# Patient Record
Sex: Male | Born: 1940 | Race: White | Hispanic: No | Marital: Married | Smoking: Former smoker
Health system: Southern US, Community
[De-identification: ages and names within clinical notes are randomized; demographics above are authoritative.]

## PROBLEM LIST (undated history)

## (undated) DIAGNOSIS — Z8719 Personal history of other diseases of the digestive system: Secondary | ICD-10-CM

## (undated) DIAGNOSIS — I724 Aneurysm of artery of lower extremity: Secondary | ICD-10-CM

## (undated) DIAGNOSIS — I219 Acute myocardial infarction, unspecified: Secondary | ICD-10-CM

## (undated) DIAGNOSIS — E785 Hyperlipidemia, unspecified: Secondary | ICD-10-CM

## (undated) DIAGNOSIS — E119 Type 2 diabetes mellitus without complications: Secondary | ICD-10-CM

## (undated) DIAGNOSIS — Z9289 Personal history of other medical treatment: Secondary | ICD-10-CM

## (undated) DIAGNOSIS — G510 Bell's palsy: Secondary | ICD-10-CM

## (undated) DIAGNOSIS — I10 Essential (primary) hypertension: Secondary | ICD-10-CM

## (undated) DIAGNOSIS — J449 Chronic obstructive pulmonary disease, unspecified: Secondary | ICD-10-CM

## (undated) DIAGNOSIS — I2510 Atherosclerotic heart disease of native coronary artery without angina pectoris: Secondary | ICD-10-CM

## (undated) DIAGNOSIS — M1990 Unspecified osteoarthritis, unspecified site: Secondary | ICD-10-CM

## (undated) DIAGNOSIS — I639 Cerebral infarction, unspecified: Secondary | ICD-10-CM

## (undated) DIAGNOSIS — C801 Malignant (primary) neoplasm, unspecified: Secondary | ICD-10-CM

## (undated) HISTORY — DX: Bell's palsy: G51.0

## (undated) HISTORY — DX: Chronic obstructive pulmonary disease, unspecified: J44.9

## (undated) HISTORY — PX: CARDIAC CATHETERIZATION: SHX172

## (undated) HISTORY — DX: Essential (primary) hypertension: I10

## (undated) HISTORY — DX: Type 2 diabetes mellitus without complications: E11.9

## (undated) HISTORY — PX: EYE SURGERY: SHX253

## (undated) HISTORY — DX: Unspecified osteoarthritis, unspecified site: M19.90

## (undated) HISTORY — DX: Personal history of other medical treatment: Z92.89

## (undated) HISTORY — PX: TONSILLECTOMY: SUR1361

## (undated) HISTORY — DX: Atherosclerotic heart disease of native coronary artery without angina pectoris: I25.10

## (undated) HISTORY — DX: Hyperlipidemia, unspecified: E78.5

---

## 1965-12-22 HISTORY — PX: THYROIDECTOMY, PARTIAL: SHX18

## 1996-12-22 DIAGNOSIS — Z9289 Personal history of other medical treatment: Secondary | ICD-10-CM

## 1996-12-22 HISTORY — DX: Personal history of other medical treatment: Z92.89

## 2001-08-22 ENCOUNTER — Encounter: Payer: Self-pay | Admitting: Family Medicine

## 2002-09-21 ENCOUNTER — Encounter: Payer: Self-pay | Admitting: Family Medicine

## 2003-02-20 ENCOUNTER — Encounter: Payer: Self-pay | Admitting: Family Medicine

## 2003-02-20 LAB — CONVERTED CEMR LAB: Hgb A1c MFr Bld: 5.8 %

## 2003-11-22 ENCOUNTER — Encounter: Payer: Self-pay | Admitting: Family Medicine

## 2004-01-25 LAB — FECAL OCCULT BLOOD, GUAIAC: Fecal Occult Blood: NEGATIVE

## 2004-08-22 ENCOUNTER — Encounter: Payer: Self-pay | Admitting: Family Medicine

## 2005-04-21 ENCOUNTER — Encounter: Payer: Self-pay | Admitting: Family Medicine

## 2005-04-21 LAB — CONVERTED CEMR LAB: PSA: 3.16 ng/mL

## 2005-04-24 ENCOUNTER — Ambulatory Visit: Payer: Self-pay | Admitting: Family Medicine

## 2005-05-26 ENCOUNTER — Ambulatory Visit: Payer: Self-pay | Admitting: Family Medicine

## 2005-08-22 HISTORY — PX: OTHER SURGICAL HISTORY: SHX169

## 2005-11-21 ENCOUNTER — Encounter: Payer: Self-pay | Admitting: Family Medicine

## 2005-11-21 LAB — CONVERTED CEMR LAB: PSA: 3.38 ng/mL

## 2005-11-24 ENCOUNTER — Ambulatory Visit: Payer: Self-pay | Admitting: Family Medicine

## 2005-12-01 ENCOUNTER — Ambulatory Visit: Payer: Self-pay | Admitting: Family Medicine

## 2006-06-22 ENCOUNTER — Ambulatory Visit: Payer: Self-pay | Admitting: Family Medicine

## 2006-06-22 LAB — CONVERTED CEMR LAB
Hgb A1c MFr Bld: 5.5 %
PSA: 2.13 ng/mL

## 2006-06-25 ENCOUNTER — Ambulatory Visit: Payer: Self-pay | Admitting: Family Medicine

## 2006-06-26 ENCOUNTER — Ambulatory Visit: Payer: Self-pay | Admitting: Cardiology

## 2006-06-29 ENCOUNTER — Ambulatory Visit: Payer: Self-pay

## 2006-06-29 HISTORY — PX: OTHER SURGICAL HISTORY: SHX169

## 2006-07-02 ENCOUNTER — Encounter (INDEPENDENT_AMBULATORY_CARE_PROVIDER_SITE_OTHER): Payer: Self-pay | Admitting: *Deleted

## 2006-07-02 ENCOUNTER — Ambulatory Visit (HOSPITAL_COMMUNITY): Admission: RE | Admit: 2006-07-02 | Discharge: 2006-07-03 | Payer: Self-pay | Admitting: Ophthalmology

## 2007-10-06 ENCOUNTER — Ambulatory Visit: Payer: Self-pay | Admitting: Family Medicine

## 2007-10-08 ENCOUNTER — Ambulatory Visit: Payer: Self-pay | Admitting: Family Medicine

## 2007-10-13 LAB — CONVERTED CEMR LAB
ALT: 24 units/L (ref 0–53)
AST: 21 units/L (ref 0–37)
Alkaline Phosphatase: 60 units/L (ref 39–117)
BUN: 22 mg/dL (ref 6–23)
Basophils Relative: 1 % (ref 0.0–1.0)
CO2: 28 meq/L (ref 19–32)
Calcium: 9 mg/dL (ref 8.4–10.5)
Chloride: 106 meq/L (ref 96–112)
Cholesterol: 198 mg/dL (ref 0–200)
Direct LDL: 94.2 mg/dL
Eosinophils Relative: 8.2 % — ABNORMAL HIGH (ref 0.0–5.0)
GFR calc Af Amer: 124 mL/min
GFR calc non Af Amer: 102 mL/min
HDL: 17.6 mg/dL — ABNORMAL LOW (ref >39.0)
Lymphocytes Relative: 31 % (ref 12.0–46.0)
Monocytes Relative: 10.9 % (ref 3.0–11.0)
Neutro Abs: 3.3 10*3/uL (ref 1.4–7.7)
Platelets: 183 10*3/uL (ref 150–400)
RBC: 5.69 M/uL (ref 4.22–5.81)
TSH: 2.08 microintl units/mL (ref 0.35–5.50)
Total CHOL/HDL Ratio: 11.3
Total Protein: 6.9 g/dL (ref 6.0–8.3)
Triglycerides: 772 mg/dL (ref 0–149)
VLDL: 154 mg/dL — ABNORMAL HIGH (ref 0–40)
WBC: 6.8 10*3/uL (ref 4.5–10.5)

## 2007-10-19 ENCOUNTER — Encounter: Payer: Self-pay | Admitting: Family Medicine

## 2007-10-19 DIAGNOSIS — L719 Rosacea, unspecified: Secondary | ICD-10-CM | POA: Insufficient documentation

## 2007-10-19 DIAGNOSIS — M1990 Unspecified osteoarthritis, unspecified site: Secondary | ICD-10-CM

## 2007-10-19 DIAGNOSIS — F528 Other sexual dysfunction not due to a substance or known physiological condition: Secondary | ICD-10-CM | POA: Insufficient documentation

## 2007-10-19 DIAGNOSIS — E7800 Pure hypercholesterolemia, unspecified: Secondary | ICD-10-CM | POA: Insufficient documentation

## 2007-10-19 DIAGNOSIS — G4700 Insomnia, unspecified: Secondary | ICD-10-CM | POA: Insufficient documentation

## 2007-10-19 DIAGNOSIS — I10 Essential (primary) hypertension: Secondary | ICD-10-CM | POA: Insufficient documentation

## 2007-10-28 ENCOUNTER — Ambulatory Visit: Payer: Self-pay | Admitting: Family Medicine

## 2007-10-28 LAB — CONVERTED CEMR LAB: Testosterone: 367.04 ng/dL (ref 350.00–890)

## 2007-11-01 ENCOUNTER — Ambulatory Visit: Payer: Self-pay | Admitting: Family Medicine

## 2007-11-24 ENCOUNTER — Telehealth: Payer: Self-pay | Admitting: Family Medicine

## 2007-12-13 ENCOUNTER — Encounter (INDEPENDENT_AMBULATORY_CARE_PROVIDER_SITE_OTHER): Payer: Self-pay | Admitting: *Deleted

## 2008-01-24 ENCOUNTER — Ambulatory Visit: Payer: Self-pay | Admitting: Family Medicine

## 2008-01-31 ENCOUNTER — Ambulatory Visit: Payer: Self-pay | Admitting: Family Medicine

## 2008-01-31 LAB — CONVERTED CEMR LAB
AST: 23 units/L (ref 0–37)
Albumin: 3.9 g/dL (ref 3.5–5.2)
BUN: 14 mg/dL (ref 6–23)
Basophils Absolute: 0.1 10*3/uL (ref 0.0–0.1)
Calcium: 9.1 mg/dL (ref 8.4–10.5)
Chloride: 104 meq/L (ref 96–112)
Cholesterol: 188 mg/dL (ref 0–200)
Creatinine,U: 122.7 mg/dL
Direct LDL: 92.9 mg/dL
Eosinophils Absolute: 0.5 10*3/uL (ref 0.0–0.6)
GFR calc non Af Amer: 89 mL/min
HDL: 24.1 mg/dL — ABNORMAL LOW (ref >39.0)
MCHC: 33.2 g/dL (ref 30.0–36.0)
MCV: 95.3 fL (ref 78.0–100.0)
Monocytes Relative: 7.8 % (ref 3.0–11.0)
Neutrophils Relative %: 51.1 % (ref 43.0–77.0)
PSA: 3.06 ng/mL (ref 0.10–4.00)
Platelets: 161 10*3/uL (ref 150–400)
RBC: 5.84 M/uL — ABNORMAL HIGH (ref 4.22–5.81)
Sodium: 140 meq/L (ref 135–145)
Triglycerides: 424 mg/dL (ref 0–149)

## 2008-05-16 ENCOUNTER — Telehealth: Payer: Self-pay | Admitting: Family Medicine

## 2008-10-30 ENCOUNTER — Telehealth: Payer: Self-pay | Admitting: Family Medicine

## 2009-07-02 ENCOUNTER — Telehealth: Payer: Self-pay | Admitting: Family Medicine

## 2009-08-04 ENCOUNTER — Ambulatory Visit: Payer: Self-pay | Admitting: Internal Medicine

## 2009-08-04 ENCOUNTER — Observation Stay (HOSPITAL_COMMUNITY): Admission: EM | Admit: 2009-08-04 | Discharge: 2009-08-05 | Payer: Self-pay | Admitting: Emergency Medicine

## 2009-08-04 DIAGNOSIS — Z9289 Personal history of other medical treatment: Secondary | ICD-10-CM

## 2009-08-04 HISTORY — DX: Personal history of other medical treatment: Z92.89

## 2009-08-05 ENCOUNTER — Telehealth: Payer: Self-pay | Admitting: Family Medicine

## 2009-08-06 ENCOUNTER — Ambulatory Visit: Payer: Self-pay | Admitting: Family Medicine

## 2009-08-06 DIAGNOSIS — M5430 Sciatica, unspecified side: Secondary | ICD-10-CM

## 2009-08-17 ENCOUNTER — Ambulatory Visit: Payer: Self-pay | Admitting: Family Medicine

## 2009-08-17 LAB — CONVERTED CEMR LAB
Glucose, Urine, Semiquant: NEGATIVE
Urobilinogen, UA: 0.2
WBC Urine, dipstick: NEGATIVE
pH: 6

## 2009-10-26 ENCOUNTER — Telehealth (INDEPENDENT_AMBULATORY_CARE_PROVIDER_SITE_OTHER): Payer: Self-pay | Admitting: Internal Medicine

## 2009-10-29 ENCOUNTER — Telehealth: Payer: Self-pay | Admitting: Family Medicine

## 2009-12-05 ENCOUNTER — Telehealth (INDEPENDENT_AMBULATORY_CARE_PROVIDER_SITE_OTHER): Payer: Self-pay | Admitting: Internal Medicine

## 2009-12-10 ENCOUNTER — Telehealth: Payer: Self-pay | Admitting: Family Medicine

## 2009-12-13 ENCOUNTER — Ambulatory Visit: Payer: Self-pay | Admitting: Family Medicine

## 2010-02-04 ENCOUNTER — Telehealth: Payer: Self-pay | Admitting: Family Medicine

## 2010-03-15 ENCOUNTER — Ambulatory Visit: Payer: Self-pay | Admitting: Family Medicine

## 2010-03-15 LAB — CONVERTED CEMR LAB
BUN: 15 mg/dL (ref 6–23)
CO2: 31 meq/L (ref 19–32)
Calcium: 9.5 mg/dL (ref 8.4–10.5)
Creatinine, Ser: 0.9 mg/dL (ref 0.4–1.5)
Glucose, Bld: 135 mg/dL — ABNORMAL HIGH (ref 70–99)

## 2010-03-18 ENCOUNTER — Ambulatory Visit: Payer: Self-pay | Admitting: Family Medicine

## 2010-03-18 DIAGNOSIS — E1169 Type 2 diabetes mellitus with other specified complication: Secondary | ICD-10-CM | POA: Insufficient documentation

## 2010-03-18 DIAGNOSIS — E119 Type 2 diabetes mellitus without complications: Secondary | ICD-10-CM | POA: Insufficient documentation

## 2010-06-04 ENCOUNTER — Ambulatory Visit: Payer: Self-pay | Admitting: Family Medicine

## 2010-06-04 DIAGNOSIS — F17200 Nicotine dependence, unspecified, uncomplicated: Secondary | ICD-10-CM | POA: Insufficient documentation

## 2010-06-04 DIAGNOSIS — M79609 Pain in unspecified limb: Secondary | ICD-10-CM

## 2010-07-25 ENCOUNTER — Encounter (INDEPENDENT_AMBULATORY_CARE_PROVIDER_SITE_OTHER): Payer: Self-pay | Admitting: *Deleted

## 2010-09-11 ENCOUNTER — Telehealth: Payer: Self-pay | Admitting: Family Medicine

## 2010-10-03 ENCOUNTER — Ambulatory Visit: Payer: Self-pay | Admitting: Family Medicine

## 2010-10-03 DIAGNOSIS — N138 Other obstructive and reflux uropathy: Secondary | ICD-10-CM

## 2010-10-03 DIAGNOSIS — N401 Enlarged prostate with lower urinary tract symptoms: Secondary | ICD-10-CM

## 2010-11-25 ENCOUNTER — Ambulatory Visit: Payer: Self-pay | Admitting: Family Medicine

## 2010-11-26 LAB — CONVERTED CEMR LAB
ALT: 62 units/L — ABNORMAL HIGH (ref 0–53)
AST: 60 units/L — ABNORMAL HIGH (ref 0–37)
Basophils Relative: 0.6 % (ref 0.0–3.0)
Bilirubin, Direct: 0.1 mg/dL (ref 0.0–0.3)
Chloride: 99 meq/L (ref 96–112)
Direct LDL: 83.1 mg/dL
Eosinophils Relative: 6.8 % — ABNORMAL HIGH (ref 0.0–5.0)
HCT: 48.9 % (ref 39.0–52.0)
Lymphs Abs: 2.5 10*3/uL (ref 0.7–4.0)
MCV: 93.2 fL (ref 78.0–100.0)
Microalb, Ur: 4.7 mg/dL — ABNORMAL HIGH (ref 0.0–1.9)
Monocytes Absolute: 0.6 10*3/uL (ref 0.1–1.0)
Neutrophils Relative %: 47.7 % (ref 43.0–77.0)
Potassium: 5 meq/L (ref 3.5–5.1)
RBC: 5.25 M/uL (ref 4.22–5.81)
Sodium: 136 meq/L (ref 135–145)
Total CHOL/HDL Ratio: 6
Total Protein: 7.2 g/dL (ref 6.0–8.3)
VLDL: 86.8 mg/dL — ABNORMAL HIGH (ref 0.0–40.0)
WBC: 6.9 10*3/uL (ref 4.5–10.5)

## 2010-11-28 ENCOUNTER — Ambulatory Visit: Payer: Self-pay | Admitting: Family Medicine

## 2010-11-28 DIAGNOSIS — F329 Major depressive disorder, single episode, unspecified: Secondary | ICD-10-CM

## 2010-11-28 LAB — HM DIABETES FOOT EXAM

## 2011-01-12 ENCOUNTER — Encounter: Payer: Self-pay | Admitting: Family Medicine

## 2011-01-21 NOTE — Progress Notes (Signed)
Summary: Rx Trazodone  Phone Note Refill Request Call back at [phone] Message from:  [facility] on September 11, 2010 10:34 AM  Refills Requested: Medication #1:  TRAZODONE HCL 100 MG  TABS 1 tablet by mouth at bedtime   Last Refilled: 05/14/2010  Method Requested: Electronic Initial call taken by: Sydell Axon LPN,  September 11, 2010 10:35 AM  Follow-up for Phone Call        done Follow-up by: Crawford Givens MD,  September 11, 2010 11:50 AM    Prescriptions: TRAZODONE HCL 100 MG  TABS (TRAZODONE HCL) 1 tablet by mouth at bedtime  #30 x 5   Entered and Authorized by:   Crawford Givens MD   Signed by:   Crawford Givens MD on 09/11/2010   Method used:   Electronically to        [facility] [id number] (retail)       [address] [place], [place]  [address]       Ph: [phone]       Fax: [phone]   RxID:   [id number]

## 2011-01-21 NOTE — Assessment & Plan Note (Signed)
Summary: F/U AND RIGHT SIDE PAIN/CLE   Vital Signs:  Patient profile:   70 year old male Weight:      201.25 pounds Temp:     98.5 degrees F oral Pulse rate:   72 / minute Pulse rhythm:   regular BP sitting:   122 / 84  (left arm) Cuff size:   large  Vitals Entered By: Sydell Axon LPN (June 04, 2010 10:53 AM) CC: Follow-up, right side pain off and on for 5-6 months   History of Present Illness: Pt here in followup for wt loss and BP. He is doing well and has continued to lose weight and feels better. His BP is correspondingly good. He has complaint of RL back pain which happens at various times of the day and the bottom line after much questioning is it sounds muscular. He has been taking his wife's Hydrocodone which he was not aware was narcotioc. We discussed narcotic use and he has decided to avoid at this point. In addition he had been on Naprsyn in the past and we discussed the efficacy for this problem but settled on trying Tyl ES 2 tabs three times a day as needed. He related this pain bothers hiom while golfing and esp after. Taht was covered as well and heat and ice were suggsested.  He also wants to quit smoking again and wants Zyban as he was successful with this in the past. He also has pain in the procx digit laterally of the left ring finger with small knot that appears to be tendonitis.  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 1 Tablet By Mouth At Bedtime 2)  Lopid 600 Mg  Tabs (Gemfibrozil) .Marland Kitchen.. 1 Twice A Day By Mouth 3)  Enalapril Maleate 10 Mg  Tabs (Enalapril Maleate) .Marland Kitchen.. 1 Daily By Mouth 4)  Cyclobenzaprine Hcl 10 Mg  Tabs (Cyclobenzaprine Hcl) .Marland Kitchen.. 1 Three Times A Day As Needed. 5)  Fexofenadine Hcl 180 Mg  Tabs (Fexofenadine Hcl) .Marland Kitchen.. 1 Once Daily As Needed 6)  Ecotrin Low Strength 81 Mg  Tbec (Aspirin) .... Take One By Mouth Once A Day 7)  B Complex 8)  Vitamin E .... Take One By Mouth Once A Day 9)  Ginseng 10)  Ginkoba 40 Mg  Tabs (Ginkgo Biloba) 11)  Cod Liver Oil   Caps (Cod Liver Oil) 12)  N.t.g. Gr. 1/150 .... As Needed 13)  Triamcinolone Acetonide 0.025 %  Crea (Triamcinolone Acetonide) .... Apply To Area Sparingly Two Times A Day As Needed. 14)  Onetouch Ultra Test  Strp (Glucose Blood) .... Check Blood Sugar As Directed 250.00 Daily 15)  Onetouch Ultrasoft Lancets  Misc (Lancets) .... Check Sugar Daily Icd9 Code: 250.00 16)  Prostate  Tabs (Specialty Vitamins Products) .... Two Tabs Twice Daily 17)  Vitamin C 500 Mg  Tabs (Ascorbic Acid) .... 1,000 Mg. One Daily 18)  Vitamin D-1000 Max St [phone] Mg-Unit Tabs (Calcium-Vitamin D) .Marland KitchenMarland KitchenMarland Kitchen  One Daily 19)  Naproxen 500 Mg Tabs (Naproxen) .... Take 1 Tablet By Mouth Two Times A Day As Needed With Food 20)  Fish Oil   Oil (Fish Oil) .... Once Daily 21)  Metformin Hcl 500 Mg Tabs (Metformin Hcl) .... One Tab By Mouth At Night  Allergies: 1)  ! Penicillin  Physical Exam  General:  alert, well-developed, well-nourished, and well-hydrated.   Head:  Normocephalic and atraumatic without obvious abnormalities. No apparent alopecia or balding. Eyes:  Conjunctiva clear bilaterally.  Ears:  External ear exam shows no significant lesions or deformities.  Otoscopic examination reveals clear canals, tympanic membranes are intact bilaterally without bulging, retraction, inflammation or discharge. Hearing is grossly normal bilaterally. Nose:   External nasal examination shows no deformity or inflammation. Nasal mucosa are pink and moist without lesions or exudates. Mouth:  Oral mucosa and oropharynx without lesions or exudates.  Teeth in good repair. Uvula with 5mm verrucal -appearing wart on right side of lat uvula Neck:  no JVD and no carotid bruits.   Lungs:  normal respiratory effort, no intercostal retractions, no accessory muscle use, normal breath sounds, no crackles, and no wheezes.   Heart:  normal rate, regular rhythm, and no murmur.   Msk:  RL back minimally tender with provacative maneuvers, always with motion and activity. Extremities:  Left ring finger tender on radial side of prox phalanx.   Impression & Recommendations:  Problem # 1:  HYPERTENSION, BENIGN (ICD-401.1) Assessment Unchanged Stable. His updated medication list for this problem includes:    Enalapril Maleate 10 Mg Tabs (Enalapril maleate) .Marland Kitchen... 1 daily by mouth  BP today: 122/84 Prior BP: 110/70 (03/18/2010)  Labs Reviewed: K+: 4.6 (03/15/2010) Creat: : 0.9 (03/15/2010)   Chol: 188 (01/31/2008)   HDL: 24.1 (01/31/2008)   LDL: DEL (01/31/2008)   TG: 424 (01/31/2008)  Problem # 2:  SCIATICA, CHRONIC (ICD-724.3) Assessment: Unchanged  Has now had some involvement of the right side. Discussed using heat and ice and exercises, warming up well with activity and pregolf routine. The following medications were removed from the medication list:    Naproxen 500 Mg Tabs (Naproxen) .Marland Kitchen... Take 1 tablet by mouth two times a day as needed with food His updated medication list for this problem includes:    Cyclobenzaprine Hcl 10 Mg Tabs (Cyclobenzaprine hcl) .Marland Kitchen... 1 three times a day as needed.    Ecotrin Low Strength 81 Mg Tbec (Aspirin) .Marland Kitchen... Take one by mouth once a day  Discussed use of moist heat or ice, modified activities, medications, and stretching/strengthening exercises. Back care instructions discussed.  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 Increase ASA to 2 81mg  a day. Sounds stable and not CVA related.  Problem # 4:  DEGENERATIVE JOINT DISEASE (ICD-715.90) Assessment: Unchanged  Prob also impacting the above. Heat and ice, exercises as discussed. The following medications were removed from the medication list:    Naproxen 500 Mg Tabs (Naproxen) .Marland Kitchen... Take 1 tablet by mouth two times a day as needed with food His updated medication list for this problem includes:    Ecotrin Low Strength 81 Mg Tbec (Aspirin) .Marland Kitchen... Take one by mouth once a day  Discussed use of medications, application of heat or cold, and exercises.   Problem # 5:  DM (ICD-250.00) Assessment: Improved  HAs to be improved. He has continued weight loss, now down to 201. Congrats. Cont efforts. He admits he feels better. His  updated medication list for this problem includes:    Enalapril Maleate 10 Mg Tabs (Enalapril maleate) .Marland Kitchen... 1 daily by mouth    Ecotrin Low Strength 81 Mg Tbec (Aspirin) .Marland Kitchen... Take one by mouth once a day    Metformin Hcl 500 Mg Tabs (Metformin hcl) ..... 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 He wants to try Zyban again as he was successful with that with no problems. Script written. His updated medication list for this problem includes:    Zyban 150 Mg Xr12h-tab (Bupropion hcl (smoking deter)) ..... One tab by mouth once daily for one week and then two times a day  Problem # 7:  PAIN IN SOFT TISSUES OF L RING FINGER (ICD-729.5) Assessment: New Appears to be pain at the insertion of the flexor tendon of the radial side of the proximal phalanx of the left ring finger with a reflex inflammatory nodule at the area. Suggest buddy  taping to relax and rest the flexor tendon by restricting flexion. Soak as much as poss in hot water.  Complete Medication List: 1)  Trazodone Hcl 100 Mg Tabs (Trazodone hcl) .Marland Kitchen.. 1 tablet by mouth at bedtime 2)  Lopid 600 Mg Tabs (Gemfibrozil) .Marland Kitchen.. 1 twice a day by mouth 3)  Enalapril Maleate 10 Mg Tabs (Enalapril maleate) .Marland Kitchen.. 1 daily by mouth 4)  Cyclobenzaprine Hcl 10 Mg Tabs (Cyclobenzaprine hcl) .Marland Kitchen.. 1 three times a day as needed. 5)  Ecotrin Low Strength 81 Mg Tbec (Aspirin) .... Take one by mouth once a day 6)  Ginkoba 40 Mg Tabs (Ginkgo biloba) 7)  Cod Liver Oil Caps (Cod liver oil) 8)  N.t.g. Gr. 1/150  .... As needed 9)  Triamcinolone Acetonide 0.025 % Crea (Triamcinolone acetonide) .... Apply to area sparingly two times a day as needed. 10)  Onetouch Ultra Test Strp (Glucose blood) .... Check blood sugar as directed 250.00 daily 11)  Onetouch Ultrasoft Lancets Misc (Lancets) .... Check sugar daily icd9 code: 250.00 12)  Prostate Tabs (Specialty vitamins products) .... Two tabs twice daily 13)  Vitamin C 500 Mg Tabs (Ascorbic acid) .... 1,000 mg. one daily 14)  Vitamin D-1000 Max St [phone] Mg-unit Tabs (Calcium-vitamin d) .... One daily 15)  Fish Oil Oil (Fish oil) .... Once daily 16)  Metformin Hcl 500 Mg Tabs (Metformin hcl) .... One tab by mouth at night 17)  Vitamin E 400 Unit Caps (Vitamin e) .... Take one by mouth daily 18)  B-100 Complex Tabs (Vitamins-lipotropics) .... Take one by mouth daily 19)  Ginseng 250 Mg Caps (Ginseng) .... Take one by mouth daily 20)  Zyban 150 Mg Xr12h-tab (Bupropion hcl (smoking deter)) .... One tab by mouth once daily for one week and then two times a day  Patient Instructions: 1)  40+ minutes spent with pt. Prescriptions: ZYBAN 150 MG XR12H-TAB (BUPROPION HCL (SMOKING DETER)) one tab by mouth once daily for one week and then two times a day  #60 x 12   Entered and Authorized by:   Shaune Leeks MD   Signed by:   Shaune Leeks MD on 06/04/2010   Method used:   Electronically to        [facility] [id number] (retail)  [address]       [place], [place]  [address]       Ph: [phone]       Fax: [phone]   RxID:   [id number]   Current Allergies (reviewed today): ! PENICILLIN

## 2011-01-21 NOTE — Assessment & Plan Note (Signed)
Summary: CPX/CLE   Vital Signs:  Patient profile:   70 year old male Weight:      201.50 pounds Temp:     98.3 degrees F oral Pulse rate:   76 / minute Pulse rhythm:   regular BP sitting:   124 / 84  (left arm) Cuff size:   large  Vitals Entered By: Sydell Axon LPN (November 28, 2010 10:59 AM) CC: 30 Minute checkup, needs a refill on Nitrostat   History of Present Illness: Pt here for Comp Exam. He is still on Zyban, off cigarettes for years, off cigars for 3 weeks. He thinks he has some depression...he gets down now and then...lasts half a day or so.  He denies SI/HI. He is not ready for other antidepress meds.  Preventive Screening-Counseling & Management  Alcohol-Tobacco     Alcohol drinks/day: 0     Smoking Status: quit     Year Quit: 10/05, still smokes cigars x2 daily     Cigars/week: 0     Passive Smoke Exposure: no  Caffeine-Diet-Exercise     Caffeine use/day: 3     Does Patient Exercise: yes     Type of exercise: walks      Times/week: 4  Problems Prior to Update: 1)  Musculoskeletal Pain, Right Pelvic Brim  (ICD-781.99) 2)  Benign Prostatic Hypertrophy, With Obstruction  (ICD-600.01) 3)  Pain in Soft Tissues of L Ring Finger  (ICD-729.5) 4)  Nicotine Addiction  (ICD-305.1) 5)  Dm (GLU INTOL 2003)  (ICD-250.00) 6)  Sciatica, Chronic  (ICD-724.3) 7)  Bell's Palsy, Right  (ICD-351.0) 8)  Erectile Dysfunction  (ICD-302.72) 9)  Rosacea  (ICD-695.3) 10)  Degenerative Joint Disease  (ICD-715.90) 11)  Insomnia  (ICD-780.52) 12)  Coronary Artery Disease, ( MI ANGIOPLASTY)  (ICD-414.00) 13)  Screening For Malignant Neoplasm, Prostate  (ICD-V76.44) 14)  Hypercholesterolemia  (ICD-272.0) 15)  Hypertension, Benign  (ICD-401.1) 16)  Fatigue  (ICD-780.79)  Medications Prior to Update: 1)  Trazodone Hcl 100 Mg  Tabs (Trazodone Hcl) .Marland Kitchen.. 1 Tablet By Mouth At Bedtime 2)  Lopid 600 Mg  Tabs (Gemfibrozil) .Marland Kitchen.. 1 Twice A Day By Mouth 3)  Enalapril Maleate 10 Mg  Tabs  (Enalapril Maleate) .Marland Kitchen.. 1 Daily By Mouth 4)  Cyclobenzaprine Hcl 10 Mg  Tabs (Cyclobenzaprine Hcl) .Marland Kitchen.. 1 Three Times A Day As Needed. 5)  Ecotrin Low Strength 81 Mg  Tbec (Aspirin) .... Take One By Mouth Once A Day 6)  Ginkoba 40 Mg  Tabs (Ginkgo Biloba) 7)  Cod Liver Oil   Caps (Cod Liver Oil) 8)  Triamcinolone Acetonide 0.025 %  Crea (Triamcinolone Acetonide) .... Apply To Area Sparingly Two Times A Day As Needed. 9)  Onetouch Ultra Test  Strp (Glucose Blood) .... Check Blood Sugar As Directed 250.00 Daily 10)  Onetouch Ultrasoft Lancets  Misc (Lancets) .... Check Sugar Daily Icd9 Code: 250.00 11)  Prostate  Tabs (Specialty Vitamins Products) .... Two Tabs Twice Daily 12)  Vitamin C 500 Mg  Tabs (Ascorbic Acid) .... 1,000 Mg. One Daily 13)  Fish Oil   Oil (Fish Oil) .... Once Daily 14)  Metformin Hcl 500 Mg Tabs (Metformin Hcl) .... One Tab By Mouth At Night 15)  Vitamin E 400 Unit Caps (Vitamin E) .... Take One By Mouth Daily 16)  B-100 Complex  Tabs (Vitamins-Lipotropics) .... Take One By Mouth Daily 17)  Ginseng 250 Mg Caps (Ginseng) .... Take One By Mouth Daily 18)  Zyban 150 Mg Xr12h-Tab (Bupropion Hcl (  Smoking Deter)) .... One Tab By Mouth Once Daily For One Week and Then Two Times A Day 19)  Fish Oil 1000 Mg Caps (Omega-3 Fatty Acids) .... Take One By Mouth Daily 20)  Nitrostat 0.4 Mg Subl (Nitroglycerin) .... Place One Under Tongue At Onset of Chest Pain, May Repeat in 5 Minutes  Current Medications (verified): 1)  Trazodone Hcl 100 Mg  Tabs (Trazodone Hcl) .Marland Kitchen.. 1 Tablet By Mouth At Bedtime 2)  Lopid 600 Mg  Tabs (Gemfibrozil) .Marland Kitchen.. 1 Twice A Day By Mouth 3)  Enalapril Maleate 10 Mg  Tabs (Enalapril Maleate) .Marland Kitchen.. 1 Daily By Mouth 4)  Cyclobenzaprine Hcl 10 Mg  Tabs (Cyclobenzaprine Hcl) .Marland Kitchen.. 1 Three Times A Day As Needed. 5)  Ecotrin Low Strength 81 Mg  Tbec (Aspirin) .... Take One By Mouth Once A Day 6)  Ginkoba 40 Mg  Tabs (Ginkgo Biloba) .... Take One By Mouth Daily 7)   Triamcinolone Acetonide 0.025 %  Crea (Triamcinolone Acetonide) .... Apply To Area Sparingly Two Times A Day As Needed. 8)  Onetouch Ultra Test  Strp (Glucose Blood) .... Check Blood Sugar As Directed 250.00 Daily 9)  Onetouch Ultrasoft Lancets  Misc (Lancets) .... Check Sugar Daily Icd9 Code: 250.00 10)  Prostate  Tabs (Specialty Vitamins Products) .... Two Tabs Twice Daily 11)  Vitamin C 500 Mg  Tabs (Ascorbic Acid) .... 1,000 Mg. One Daily 12)  Metformin Hcl 500 Mg Tabs (Metformin Hcl) .... One Tab By Mouth At Night 13)  Vitamin E 400 Unit Caps (Vitamin E) .... Take One By Mouth Daily 14)  B-100 Complex  Tabs (Vitamins-Lipotropics) .... Take One By Mouth Daily 15)  Ginseng 250 Mg Caps (Ginseng) .... Take One By Mouth Daily 16)  Zyban 150 Mg Xr12h-Tab (Bupropion Hcl (Smoking Deter)) .... One Tab By Mouth Once Daily For One Week and Then Two Times A Day 17)  Fish Oil 1000 Mg Caps (Omega-3 Fatty Acids) .... Take One By Mouth Daily 18)  Nitrostat 0.4 Mg Subl (Nitroglycerin) .... Place One Under Tongue At Onset of Chest Pain, May Repeat in 5 Minutes  Allergies: 1)  ! 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  Risk Factors: Alcohol Use: 0 (05-Dec-2010) Caffeine Use: 3 (Dec 05, 2010) Exercise: yes (Dec 05, 2010)  Risk Factors: Smoking Status: quit (December 05, 2010) Cigars/wk: 0 (12/05/2010) Passive  Smoke Exposure: no (12-05-10)  Past Surgical History: Partial Thyroidectomy B9 Growth 1967 HOSP MI PTCA 1993 ETT 98, wnl 1998 Cataract surgery repair lens which moved 09/06 Stress myoview sm distal anteroseptal & apical infarct 06/29/06 HOSP R Facial Weakness  Bell's Palsy 8/14-8/15/10 MRI/ MRA Brain  Atrophy  Sm vess Dz 08/04/09  Family History: Father: Died at age 9 of stroke and high blood pressure Mother: Died at age 69 of aneurysm and hypertension Brother A 38  CV + Father CAD, son MI HBP + Father, mother, son Breast/Ovarian/Uterine cancer - none Depression + self ETOH/Drug abuse - none Stroke -  none  Social History: Smoking Status:  quit  Review of Systems General:  Denies chills, fatigue, fever, sweats, weakness, and weight loss. Eyes:  Denies blurring, discharge, and eye pain. ENT:  Denies decreased hearing, ear discharge, earache, and ringing in ears. CV:  Denies chest pain or discomfort, fainting, fatigue, palpitations, shortness of breath with exertion, swelling of feet, and swelling of hands. Resp:  Denies cough, shortness of breath, and wheezing; improved off the cigars. GI:  Complains of constipation; denies abdominal pain, bloody stools, change in bowel habits, dark tarry stools, diarrhea, indigestion, loss of appetite, nausea, vomiting, vomiting blood, and yellowish skin color; occas.. GU:  Denies discharge, dysuria, nocturia, and urinary frequency; occas slowness. MS:  Complains of joint pain and low back pain; denies cramps and muscle weakness; R LBP. Derm:  Denies dryness and itching; has rosacea, plays golf once a week.. Neuro:  Denies numbness, poor balance, tingling, and tremors; has improved mild balance difficulties.  Physical Exam  General:  alert, well-developed, well-nourished, and well-hydrated.   Head:  Normocephalic and atraumatic without obvious abnormalities. No apparent alopecia or balding. Sinuses NT. Eyes:  Conjunctiva clear bilaterally.    Ears:  External ear exam shows no significant lesions or deformities.  Otoscopic examination reveals clear canals, tympanic membranes are intact bilaterally without bulging, retraction, inflammation or discharge. Hearing is grossly normal bilaterally. Nose:  External nasal examination shows no deformity or inflammation. Nasal mucosa are pink and moist without lesions or exudates. Mouth:  Oral mucosa and oropharynx without lesions or exudates.  Teeth in good repair. Uvula with 5mm verrucal -appearing wart on right side of lat uvula Neck:  no JVD and no carotid bruits.   Chest Wall:  No deformities, masses, tenderness or gynecomastia noted. Breasts:  No masses or gynecomastia noted Lungs:  normal respiratory effort, no intercostal retractions, no accessory muscle use, normal breath sounds, no crackles, and no wheezes.   Heart:  normal rate, regular rhythm, and no murmur.   Abdomen:  Bowel sounds positive,abdomen soft and non-tender without masses, organomegaly or hernias noted. Protuberant. Rectal:  No external abnormalities noted. Normal sphincter tone. No rectal masses or tenderness. G neg. Genitalia:  Testes bilaterally descended without nodularity, tenderness or masses. No scrotal masses or lesions. No penis lesions or urethral discharge. Prostate:  Prostate gland firm and smooth, no enlargement, nodularity, tenderness, mass, asymmetry or induration. 30 gms. Msk:  Minimal discomfort with movement but not tender to palpation over the right lateral pelvic brim. Pulses:  R and L carotid,radial,femoral,dorsalis pedis and posterior tibial pulses are full and equal bilaterally Extremities:  No clubbing, cyanosis, edema, or deformity noted with normal full range of motion of all joints.   Neurologic:  No cranial nerve deficits noted. Station and gait are normal.  Sensory, motor and coordinative functions appear intact. Skin:  Intact without suspicious lesions or rashes except rosacea of face and  neck. Cervical Nodes:  No lymphadenopathy noted Inguinal Nodes:  No significant adenopathy Psych:  Cognition and judgment appear intact. Alert and cooperative with normal attention span and concentration. No apparent delusions, illusions, hallucinations. Good affect today and good eye contact.  Diabetes Management Exam:    Foot Exam (with socks and/or shoes not present):       Sensory-Pinprick/Light touch:          Left medial foot (L-4): normal          Left dorsal foot (L-5): normal          Left lateral foot (  S-1): normal          Right medial foot (L-4): normal          Right dorsal foot (L-5): normal          Right lateral foot (S-1): normal       Sensory-Monofilament:          Left foot: normal          Right foot: normal       Inspection:          Left foot: normal          Right foot: normal       Nails:          Left foot: normal          Right foot: normal   Impression & Recommendations:  Problem # 1:  HEALTH MAINTENANCE EXAM (ICD-V70.0) Assessment Comment Only  Reviewed preventive care protocols, scheduled due services, and updated immunizations. I have personally reviewed the Medicare Annual Wellness questionnaire and have noted 1.   The patient's medical and social history 2.   Their use of alcohol, tobacco or illicit drugs 3.   Their current medications and supplements 4.   The patient's functional ability including ADL's, fall risks, home safety risks and hearing or visual             impairment. 5.   Diet and physical activities 6.   Evidence for depression or mood disorders (see note)  Problem # 2:  MUSCULOSKELETAL PAIN, RIGHT PELVIC BRIM (ICD-781.99) Assessment: Unchanged Continues but bearable and does not desire further trmt.  Problem # 3:  BENIGN PROSTATIC HYPERTROPHY, WITH OBSTRUCTION (ICD-600.01) Assessment: Unchanged Urine flow going well. PSA stable.  Problem # 4:  PAIN IN SOFT TISSUES OF L RING FINGER (ICD-729.5) Assessment:  Unchanged Continues but unchanged recently.  Problem # 5:  NICOTINE ADDICTION (ICD-305.1) Assessment: Improved Improving...Marland Kitchennow off cigars also and feels better great improvement in cough. May continue Zyban. His updated medication list for this problem includes:    Zyban 150 Mg Xr12h-tab (Bupropion hcl (smoking deter)) ..... One tab by mouth once daily for one week and then two times a day  Problem # 6:  DM (GLU INTOL 2003) (ICD-250.00) Assessment: Unchanged Good control. Encouraged increased exercise and diet control as he knows and admits he can do better. His updated medication list for this problem includes:    Enalapril Maleate 10 Mg Tabs (Enalapril maleate) .Marland Kitchen... 1 daily by mouth    Ecotrin Low Strength 81 Mg Tbec (Aspirin) .Marland Kitchen... Take one by mouth once a day    Metformin Hcl 500 Mg Tabs (Metformin hcl) ..... One tab by mouth at night  Labs Reviewed: Creat: 1.1 (11/25/2010)   Microalbumin: 6.9 (06/22/2006) Reviewed HgBA1c results: 6.4 (11/25/2010)  6.4 (03/15/2010)  Problem # 7:  HYPERCHOLESTEROLEMIA (ICD-272.0) Assessment: Unchanged Slight improvement in LDL but Trigs out of control. He admits to recent indiscretion and thinks he can improve this. His updated medication list for this problem includes:    Lopid 600 Mg Tabs (Gemfibrozil) .Marland Kitchen... 1 twice a day by mouth  Labs Reviewed: SGOT: 60 (11/25/2010)   SGPT: 62 (11/25/2010)   HDL:30.20 (11/25/2010), 24.1 (01/31/2008)  LDL:DEL (01/31/2008), DEL (10/08/2007)  Chol:179 (11/25/2010), 188 (01/31/2008)  Trig:434.0 Triglyceride is over 400; calculations on Lipids are invalid. mg/dL (16/09/9603), 540 (98/10/9146)  Problem # 8:  HYPERTENSION, BENIGN (ICD-401.1) Assessment: Unchanged Adequate control. Cont curr meds. His updated medication list for this problem includes:  Enalapril Maleate 10 Mg Tabs (Enalapril maleate) .Marland Kitchen... 1 daily by mouth  BP today: 124/84 Prior BP: 118/74 (10/03/2010)  Labs Reviewed: K+: 5.0  (11/25/2010) Creat: : 1.1 (11/25/2010)   Chol: 179 (11/25/2010)   HDL: 30.20 (11/25/2010)   LDL: DEL (01/31/2008)   TG: 434.0 Triglyceride is over 400; calculations on Lipids are invalid. mg/dL (95/62/1308)  Problem # 9:  DEPRESSION, MILD (ICD-311) Assessment: New Waxes and wanes on an irregular basis, typically in the AM which reportedly improves. He declines further medication at this point. His updated medication list for this problem includes:    Trazodone Hcl 100 Mg Tabs (Trazodone hcl) .Marland Kitchen... 1 tablet by mouth at bedtime  Complete Medication List: 1)  Trazodone Hcl 100 Mg Tabs (Trazodone hcl) .Marland Kitchen.. 1 tablet by mouth at bedtime 2)  Lopid 600 Mg Tabs (Gemfibrozil) .Marland Kitchen.. 1 twice a day by mouth 3)  Enalapril Maleate 10 Mg Tabs (Enalapril maleate) .Marland Kitchen.. 1 daily by mouth 4)  Cyclobenzaprine Hcl 10 Mg Tabs (Cyclobenzaprine hcl) .Marland Kitchen.. 1 three times a day as needed. 5)  Ecotrin Low Strength 81 Mg Tbec (Aspirin) .... Take one by mouth once a day 6)  Ginkoba 40 Mg Tabs (Ginkgo biloba) .... Take one by mouth daily 7)  Triamcinolone Acetonide 0.025 % Crea (Triamcinolone acetonide) .... Apply to area sparingly two times a day as needed. 8)  Onetouch Ultra Test Strp (Glucose blood) .... Check blood sugar as directed 250.00 daily 9)  Onetouch Ultrasoft Lancets Misc (Lancets) .... Check sugar daily icd9 code: 250.00 10)  Prostate Tabs (Specialty vitamins products) .... Two tabs twice daily 11)  Vitamin C 500 Mg Tabs (Ascorbic acid) .... 1,000 mg. one daily 12)  Metformin Hcl 500 Mg Tabs (Metformin hcl) .... One tab by mouth at night 13)  Vitamin E 400 Unit Caps (Vitamin e) .... Take one by mouth daily 14)  B-100 Complex Tabs (Vitamins-lipotropics) .... Take one by mouth daily 15)  Ginseng 250 Mg Caps (Ginseng) .... Take one by mouth daily 16)  Zyban 150 Mg Xr12h-tab (Bupropion hcl (smoking deter)) .... One tab by mouth once daily for one week and then two times a day 17)  Fish Oil 1000 Mg Caps (Omega-3  fatty acids) .... Take one by mouth daily 18)  Nitrostat 0.4 Mg Subl (Nitroglycerin) .... Place one under tongue at onset of chest pain, may repeat in 5 minutes  Other Orders: Gastroenterology Referral (GI)  Patient Instructions: 1)  RTC 6 mos, A1C prior 250.00 2)  Refer for colonoscopy. 3)  Get eye exam.   Orders Added: 1)  Gastroenterology Referral [GI] 2)  Est. Patient 65& > [[id number]]    Current Allergies (reviewed today): ! PENICILLIN

## 2011-01-21 NOTE — Progress Notes (Signed)
Summary: Rx Trazodone  Phone Note Refill Request Call back at [phone] Message from:  [facility] on February 04, 2010 8:47 AM  Refills Requested: Medication #1:  TRAZODONE HCL 100 MG  TABS 1 tablet by mouth at bedtime   Last Refilled: 12/10/2009 Received faxed refill request, please advise   Method Requested: Telephone to Pharmacy Initial call taken by: Linde Gillis CMA [place] Dull),  February 04, 2010 8:48 AM    Prescriptions: TRAZODONE HCL 100 MG  TABS (TRAZODONE HCL) 1 tablet by mouth at bedtime  #30 x 5   Entered and Authorized by:   Shaune Leeks MD   Signed by:   Shaune Leeks MD on 02/04/2010   Method used:   Electronically to        [facility] [id number] (retail)       [address] [place], [place]  [address]       Ph: [phone]       Fax: [phone]   RxID:   [id number]

## 2011-01-21 NOTE — Assessment & Plan Note (Signed)
Summary: DIZZY,NO ENERGY/CLE   Vital Signs:  Patient profile:   70 year old male Weight:      200.25 pounds BMI:     31.48 Temp:     97.9 degrees F oral Pulse rate:   84 / minute Pulse rhythm:   regular BP sitting:   118 / 74  (left arm) Cuff size:   large  Vitals Entered By: Sydell Axon LPN (October 03, 2010 11:40 AM) CC: Dizzy spells and no energy   History of Present Illness: Pt here "getting older."  He has notes w/ multiple complaints...he is taking OTC saw palmetto and OTC "Prostate Tab" It does not appear tobe excessive Saw Palmetto  We went over his medication list and told him most OTC to stop. He needs to be taking the prescribed meds on his list. The only on I gave him permission to stop is Trazodone and he is not interested in stopping that. He has indigestion at night, and we discussed avoiding nicotine, caffeine and tomato products. Also has routine pain over the right posterior pelvic brim. From the location and his complaints, it sounds muscular and we discussed approaches to help. 2 months ago started having severe fatigue and lack of desire to get off the couch. He had retired one month prior and had been staying busy until then. It sounds like he could be depressed. He recognized that things improved when he started on Zyban to help him stop smoking cigars. He is now just about off the smoking completely.  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 1 Tablet By Mouth At Bedtime 2)  Lopid 600 Mg  Tabs (Gemfibrozil) .Marland Kitchen.. 1 Twice A Day By Mouth 3)  Enalapril Maleate 10 Mg  Tabs (Enalapril Maleate) .Marland Kitchen.. 1 Daily By Mouth 4)  Cyclobenzaprine Hcl 10 Mg  Tabs (Cyclobenzaprine Hcl) .Marland Kitchen.. 1 Three Times A Day As Needed. 5)  Ecotrin Low Strength 81 Mg  Tbec (Aspirin) .... Take One By Mouth Once A Day 6)  Ginkoba 40 Mg  Tabs (Ginkgo Biloba) 7)  Cod Liver Oil   Caps (Cod Liver Oil) 8)  N.t.g. Gr. 1/150 .... As Needed 9)  Triamcinolone Acetonide 0.025 %  Crea (Triamcinolone Acetonide) .... Apply To Area Sparingly Two Times A Day As Needed. 10)  Onetouch Ultra Test  Strp (Glucose Blood) .... Check Blood Sugar As Directed 250.00 Daily 11)  Onetouch Ultrasoft Lancets  Misc (Lancets) .... Check Sugar Daily Icd9 Code: 250.00 12)  Prostate  Tabs (Specialty Vitamins Products) .... Two Tabs Twice Daily 13)  Vitamin C 500 Mg  Tabs (Ascorbic Acid) .... 1,000 Mg. One Daily 14)  Vitamin D-1000 Max St [phone] Mg-Unit Tabs (Calcium-Vitamin D) .... One Daily 15)  Fish Oil   Oil (Fish Oil) .... Once Daily 16)  Metformin Hcl 500 Mg Tabs (Metformin Hcl) .Marland KitchenMarland KitchenMarland Kitchen  One Tab By Mouth At Night 17)  Vitamin E 400 Unit Caps (Vitamin E) .... Take One By Mouth Daily 18)  B-100 Complex  Tabs (Vitamins-Lipotropics) .... Take One By Mouth Daily 19)  Ginseng 250 Mg Caps (Ginseng) .... Take One By Mouth Daily 20)  Zyban 150 Mg Xr12h-Tab (Bupropion Hcl (Smoking Deter)) .... One Tab By Mouth Once Daily For One Week and Then Two Times A Day  Allergies: 1)  ! Penicillin  Physical Exam  General:  alert, well-developed, well-nourished, and well-hydrated.   Head:  Normocephalic and atraumatic without obvious abnormalities. No apparent alopecia or balding. Eyes:  Conjunctiva clear bilaterally.  Ears:  External ear exam shows no significant lesions  or deformities.  Otoscopic examination reveals clear canals, tympanic membranes are intact bilaterally without bulging, retraction, inflammation or discharge. Hearing is grossly normal bilaterally. Nose:  External nasal examination shows no deformity or inflammation. Nasal mucosa are pink and moist without lesions or exudates. Mouth:  Oral mucosa and oropharynx without lesions or exudates.  Teeth in good repair. Uvula with 5mm verrucal -appearing wart on right side of lat uvula Neck:  no JVD and no carotid bruits.   Lungs:  normal respiratory effort, no intercostal retractions, no accessory muscle use, normal breath sounds, no crackles, and no wheezes.   Heart:  normal rate, regular rhythm, and no murmur.   Abdomen:  Bowel sounds positive,abdomen soft and non-tender without masses, organomegaly or hernias noted. Protuberant. Msk:  discomfort with movement but not tender to palpation over the right lateral pelvic brim. Extremities:  No clubbing, cyanosis, edema, or deformity noted with normal full range of motion of all joints.     Impression & Recommendations:  Problem # 1:  DM (ICD-250.00) Assessment Unchanged  Discussed again watching intake and diet. Pt appeared surprised to need to avoid sweets and carbs. He has been checking his sugar first thing in the AM fasting and has realized "what I eat for supper has a significant effect on that number!" Discussed diet, exercise and medication affects on DM. His updated medication list for this problem includes:    Enalapril Maleate 10 Mg Tabs (Enalapril maleate) .Marland Kitchen... 1 daily by mouth    Ecotrin Low Strength 81 Mg Tbec (Aspirin) .Marland Kitchen... Take one by mouth once a day    Metformin Hcl 500 Mg Tabs (Metformin hcl) ..... One tab by mouth at night Needs PE with foot exam. Needs eye exam. Labs Reviewed: Creat: 0.9 (03/15/2010)   Microalbumin: 6.9 (06/22/2006) Reviewed HgBA1c results: 6.4 (03/15/2010)  5.5 (06/22/2006)  Problem # 2:  NICOTINE ADDICTION  (ICD-305.1) Assessment: Improved Encouraged to stop smoking...appears he is on the way. His updated medication list for this problem includes:    Zyban 150 Mg Xr12h-tab (Bupropion hcl (smoking deter)) ..... One tab by mouth once daily for one week and then two times a day  Problem # 3:  FATIGUE (ICD-780.79) Assessment: Deteriorated Suggested regular exercise and routine diet to help. Going back to work and getting rid of depression will help. Does not appear to be physiologic. Cont Zyban 6 weeks after stopping smoking and then use caution stopping med with poss flare of depression. Come in if happens.  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  Problem # 5:  MUSCULOSKELETAL PAIN, RIGHT PELVIC BRIM (ICD-781.99) Assessment: New Discussed regulat exercise and stretching to help. No meds offered as seems lifestyle problem.  Problem # 6:  HYPERTENSION, BENIGN (ICD-401.1) Assessment: Unchanged Stable. His updated medication list for this problem includes:    Enalapril Maleate 10 Mg Tabs (Enalapril maleate) .Marland Kitchen... 1 daily by mouth  BP today: 118/74 Prior BP: 122/84 (06/04/2010)  Labs Reviewed: K+: 4.6 (03/15/2010) Creat: : 0.9 (03/15/2010)   Chol: 188 (01/31/2008)   HDL: 24.1 (01/31/2008)   LDL: DEL (01/31/2008)   TG: 424 (01/31/2008)  Complete Medication List: 1)  Trazodone Hcl 100 Mg Tabs (Trazodone hcl) .Marland Kitchen.. 1 tablet by mouth at bedtime 2)  Lopid 600 Mg Tabs (Gemfibrozil) .Marland Kitchen.. 1 twice a day by mouth 3)  Enalapril Maleate 10 Mg Tabs (Enalapril maleate) .Marland Kitchen.. 1 daily by mouth 4)  Cyclobenzaprine Hcl 10 Mg Tabs (Cyclobenzaprine hcl) .Marland Kitchen.. 1 three times a day as needed. 5)  Ecotrin Low Strength 81 Mg Tbec (Aspirin) .... Take one by mouth once a day 6)  Ginkoba 40 Mg Tabs (Ginkgo biloba) 7)  Cod Liver Oil Caps (Cod liver oil) 8)  Triamcinolone  Acetonide 0.025 % Crea (Triamcinolone acetonide) .... Apply to area sparingly two times a day as needed. 9)  Onetouch Ultra Test Strp (Glucose blood) .... Check blood sugar as directed 250.00 daily 10)  Onetouch Ultrasoft Lancets Misc (Lancets) .... Check sugar daily icd9 code: 250.00 11)  Prostate Tabs (Specialty vitamins products) .... Two tabs twice daily 12)  Vitamin C 500 Mg Tabs (Ascorbic acid) .... 1,000 mg. one daily 13)  Fish Oil Oil (Fish oil) .... Once daily 14)  Metformin Hcl 500 Mg Tabs (Metformin hcl) .... One tab by mouth at night 15)  Vitamin E 400 Unit Caps (Vitamin e) .... Take one by mouth daily 16)  B-100 Complex Tabs (Vitamins-lipotropics) .... Take one by mouth daily 17)  Ginseng 250 Mg Caps (Ginseng) .... Take one by mouth daily 18)  Zyban 150 Mg Xr12h-tab (Bupropion hcl (smoking deter)) .... One tab by mouth once daily for one week and then two times a day 19)  Fish Oil 1000 Mg Caps (Omega-3 fatty acids) .... Take one by mouth daily 20)  Nitrostat 0.4 Mg Subl (Nitroglycerin) .... Place one under tongue at onset of chest pain, may repeat in 5 minutes  Patient Instructions: 1)  RTC as needed. 2)  Needs eye exam.  Current Allergies (reviewed today): ! PENICILLIN

## 2011-01-21 NOTE — Assessment & Plan Note (Signed)
Summary: 8:00AM - F/U AFTER LABS / LFW   Vital Signs:  Patient profile:   70 year old male Weight:      208 pounds Temp:     76 degrees F oral Pulse rate:   76 / minute Pulse rhythm:   regular BP sitting:   110 / 70  (left arm) Cuff size:   large  Vitals Entered By: Sydell Axon LPN (March 18, 2010 8:40 AM) CC: 3 Month follow-up after labs   History of Present Illness: Pt here for recheck.  He has drastically cut down on sweets...he has lost three pounds in the three months. He thought he had gained. He walks a lot on the job.  He has also cut down on sausage. He eats a ham instead.  He is tolerating his Metformin w/o difficulty.  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 1 Tablet By Mouth At Bedtime 2)  Lopid 600 Mg  Tabs (Gemfibrozil) .Marland Kitchen.. 1 Twice A Day By Mouth 3)  Enalapril Maleate 10 Mg  Tabs (Enalapril Maleate) .Marland Kitchen.. 1 Daily By Mouth 4)  Cyclobenzaprine Hcl 10 Mg  Tabs (Cyclobenzaprine Hcl) .Marland Kitchen.. 1 Three Times A Day As Needed. 5)  Fexofenadine Hcl 180 Mg  Tabs (Fexofenadine Hcl) .Marland Kitchen.. 1 Once Daily Prn 6)  Ecotrin Low Strength 81 Mg  Tbec (Aspirin) .... Take One By Mouth Once A Day 7)  B Complex 8)  Vitamin E .... Take One By Mouth Once A Day 9)  Ginseng 10)  Ginkoba 40 Mg  Tabs (Ginkgo Biloba) 11)  Cod Liver Oil   Caps (Cod Liver Oil) 12)  N.t.g. Gr. 1/150 .... As Needed 13)  Triamcinolone Acetonide 0.025 %   Crea (Triamcinolone Acetonide) .... Apply To Area Sparingly Two Times A Day As Needed. 14)  Onetouch Ultra Test  Strp (Glucose Blood) .... Check Blood Sugar As Directed 250.00 Daily 15)  Onetouch Ultrasoft Lancets  Misc (Lancets) .... Check Sugar Daily Icd9 Code: 250.00 16)  Prostate  Tabs (Specialty Vitamins Products) .... Two Tabs Twice Daily 17)  Vitamin C 500 Mg  Tabs (Ascorbic Acid) .... 1,000 Mg. One Daily 18)  Vitamin D-1000 Max St [phone] Mg-Unit Tabs (Calcium-Vitamin D) .... One Daily 19)  Naproxen 500 Mg Tabs (Naproxen) .... Take 1 Tablet By Mouth Two Times A Day As Needed With Food 20)  Fish Oil   Oil (Fish Oil) .... Once Daily 21)  Metformin Hcl 500 Mg Tabs (Metformin Hcl) .... One Tab By Mouth At Night  Allergies: 1)  ! Penicillin  Physical Exam  General:  alert, well-developed, well-nourished, and well-hydrated.   Head:  Normocephalic and atraumatic without obvious abnormalities. No apparent alopecia or balding. Eyes:  Conjunctiva clear bilaterally.  Ears:  External ear exam shows no significant lesions or deformities.  Otoscopic examination reveals clear canals, tympanic membranes are intact bilaterally without bulging, retraction, inflammation or discharge. Hearing is grossly normal bilaterally. Nose:  External nasal  examination shows no deformity or inflammation. Nasal mucosa are pink and moist without lesions or exudates. Mouth:  Oral mucosa and oropharynx without lesions or exudates.  Teeth in good repair. Uvula with 5mm verrucal -appearing wart on right side of lat uvula Neck:  no JVD and no carotid bruits.   Lungs:  normal respiratory effort, no intercostal retractions, no accessory muscle use, normal breath sounds, no crackles, and no wheezes.   Heart:  normal rate, regular rhythm, and no murmur.   Abdomen:  Bowel sounds positive,abdomen soft and non-tender without masses, organomegaly or hernias noted. Protuberant. Msk:  Discommfort along right PSIS area, no  tenderness to palpation or swelling/echymosis.   Impression & Recommendations:  Problem # 1:  DM (ICD-250.00) Assessment New  Newly diagnosed, advanced from Glu Intol.Marland KitchenMarland KitchenMarland KitchenA1 C 6.4 but FBS 130s.  Cont Metformin. Discussed new risk and need for regular exercise and careful sweet and carb control.  He is hoping to retire soon.  His updated medication list for this problem includes:    Enalapril Maleate 10 Mg Tabs (Enalapril maleate) .Marland Kitchen... 1 daily by mouth    Ecotrin Low Strength 81 Mg Tbec (Aspirin) .Marland Kitchen... Take one by mouth once a day    Metformin Hcl 500 Mg Tabs (Metformin hcl) ..... One tab by mouth at night  Labs Reviewed: Creat: 0.9 (03/15/2010)   Microalbumin: 6.9 (06/22/2006) Reviewed HgBA1c results: 6.4 (03/15/2010)  5.5 (06/22/2006)  Problem # 2:  SCIATICA, CHRONIC (ICD-724.3) Assessment: Unchanged  Stable. Again discussed exercises. His updated medication list for this problem includes:    Cyclobenzaprine Hcl 10 Mg Tabs (Cyclobenzaprine hcl) .Marland Kitchen... 1 three times a day as needed.    Ecotrin Low Strength 81 Mg Tbec (Aspirin) .Marland Kitchen... Take one by mouth once a day    Naproxen 500 Mg Tabs (Naproxen) .Marland Kitchen... Take 1 tablet by mouth two times a day as needed with food  Discussed use of moist heat or ice, modified activities, medications, and stretching/strengthening exercises. Back care instructions given. To be seen in 2 weeks if no improvement; sooner if worsening of symptoms.   Problem # 3:  HYPERTENSION, BENIGN (ICD-401.1) Assessment: Unchanged  His updated medication list for this problem includes:    Enalapril Maleate 10 Mg Tabs (Enalapril maleate) .Marland Kitchen... 1 daily by mouth  BP today: 110/70 Prior BP: 124/80 (12/13/2009)  Labs Reviewed: K+: 4.6 (03/15/2010) Creat: : 0.9 (03/15/2010)   Chol: 188 (01/31/2008)   HDL: 24.1 (01/31/2008)   LDL: DEL (01/31/2008)   TG: 424 (01/31/2008)  Complete Medication List: 1)  Trazodone Hcl 100 Mg Tabs (Trazodone hcl) .Marland Kitchen.. 1 tablet by mouth at  bedtime 2)  Lopid 600 Mg Tabs (Gemfibrozil) .Marland Kitchen.. 1 twice a day by mouth 3)  Enalapril Maleate 10 Mg Tabs (Enalapril maleate) .Marland Kitchen.. 1 daily by mouth 4)  Cyclobenzaprine Hcl 10 Mg Tabs (Cyclobenzaprine hcl) .Marland Kitchen.. 1 three times a day as needed. 5)  Fexofenadine Hcl 180 Mg Tabs (Fexofenadine hcl) .Marland Kitchen.. 1 once daily as needed 6)  Ecotrin Low Strength 81 Mg Tbec (Aspirin) .... Take one by mouth once a day 7)  B Complex  8)  Vitamin E  .... Take one by mouth once a day 9)  Ginseng  10)  Ginkoba 40 Mg Tabs (Ginkgo biloba) 11)  Cod Liver Oil Caps (Cod liver oil) 12)  N.t.g. Gr. 1/150  .... As needed 13)  Triamcinolone Acetonide 0.025 % Crea (Triamcinolone acetonide) .... Apply to area sparingly two times a day as needed. 14)  Onetouch Ultra Test  Strp (Glucose blood) .... Check blood sugar as directed 250.00 daily 15)  Onetouch Ultrasoft Lancets Misc (Lancets) .... Check sugar daily icd9 code: 250.00 16)  Prostate Tabs (Specialty vitamins products) .... Two tabs twice daily 17)  Vitamin C 500 Mg Tabs (Ascorbic acid) .... 1,000 mg. one daily 18)  Vitamin D-1000 Max St [phone] Mg-unit Tabs (Calcium-vitamin d) .... One daily 19)  Naproxen 500 Mg Tabs (Naproxen) .... Take 1 tablet by mouth two times a day as needed with food 20)  Fish Oil Oil (Fish oil) .... Once daily 21)  Metformin Hcl 500 Mg Tabs (Metformin hcl) .... One tab by mouth at night  Patient Instructions: 1)  RTC 10/11 for Comp Exam, labs prior./ Call in one mo for appt.  Current Allergies (reviewed today): ! PENICILLIN

## 2011-01-21 NOTE — Letter (Signed)
Summary: Nadara Eaton letter  [facility]  [address] [place], [place] [address]   Phone: [phone]  Fax: [phone]       07/25/2010 MRN: [id number]  Nathan Fernandez [address] [place], [place]  [address]  Dear [name] [facility] [place] [place] announce the retirement of Arta Silence, M.D., from full-time practice at the [facility] office effective June 20, 2010 and his plans of returning part-time.  It is important to Dr. Hetty Ely and to our practice that you understand that [facility] has seven physicians in our office for your health care needs.  We will continue to offer the same exceptional care that you have today.    Dr. Hetty Ely has spoken to many of you about his plans for retirement and returning part-time in the fall.   We will continue to work with you through the transition to schedule appointments for you in the office and meet the high standards that [facility] is committed to.   Again, it is with great pleasure that we share the news that Dr. Hetty Ely will return to [facility] at [facility] in October of 2011 with a reduced schedule.    If you have any questions, or would like to request an appointment with one of our physicians, please call us at [phone] and press the option for Scheduling an appointment.  We take pleasure in providing you with excellent patient care and look forward to seeing you at your next office visit.  Our [facility] Physicians are:  Tillman Abide, M.D. Laurita Quint, M.D. Roxy Manns, M.D. Kerby Nora, M.D. Hannah Beat, M.D. Ruthe Mannan, M.D. We proudly welcomed Raechel Ache, M.D. and Eustaquio Boyden, M.D. to the practice in July/August 2011.  Sincerely,  [facility]

## 2011-02-17 ENCOUNTER — Other Ambulatory Visit: Payer: Self-pay | Admitting: Gastroenterology

## 2011-02-17 LAB — HM COLONOSCOPY: HM Colonoscopy: ABNORMAL

## 2011-02-24 ENCOUNTER — Encounter: Payer: Self-pay | Admitting: Family Medicine

## 2011-02-27 ENCOUNTER — Encounter: Payer: Self-pay | Admitting: Family Medicine

## 2011-03-04 NOTE — Miscellaneous (Signed)
  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    Preventive Care Screening  Colonoscopy:    Date:  02/17/2011    Next Due:  02/2013    Results:  Adenomatous Polyp

## 2011-03-11 NOTE — Procedures (Signed)
Summary: [facility]  [facility]   Imported By: Kassie Mends 03/03/2011 10:21:40  _____________________________________________________________________  External Attachment:    Type:   Image     Comment:   External Document

## 2011-03-30 LAB — CBC
HCT: 51.3 % (ref 39.0–52.0)
MCHC: 34.3 g/dL (ref 30.0–36.0)
MCV: 94.8 fL (ref 78.0–100.0)
Platelets: 169 10*3/uL (ref 150–400)

## 2011-03-30 LAB — URINALYSIS, ROUTINE W REFLEX MICROSCOPIC
Bilirubin Urine: NEGATIVE
Glucose, UA: NEGATIVE mg/dL
Hgb urine dipstick: NEGATIVE
Ketones, ur: NEGATIVE mg/dL
Protein, ur: NEGATIVE mg/dL
pH: 6 (ref 5.0–8.0)

## 2011-03-30 LAB — COMPREHENSIVE METABOLIC PANEL
ALT: 31 U/L (ref 0–53)
AST: 29 U/L (ref 0–37)
Albumin: 3.4 g/dL — ABNORMAL LOW (ref 3.5–5.2)
Alkaline Phosphatase: 42 U/L (ref 39–117)
GFR calc Af Amer: >60 mL/min (ref >60)
Glucose, Bld: 146 mg/dL — ABNORMAL HIGH (ref 70–99)
Potassium: 3.7 mEq/L (ref 3.5–5.1)
Sodium: 138 mEq/L (ref 135–145)
Total Protein: 6.1 g/dL (ref 6.0–8.3)

## 2011-03-30 LAB — CARDIAC PANEL(CRET KIN+CKTOT+MB+TROPI)
CK, MB: 3.9 ng/mL (ref 0.3–4.0)
CK, MB: 4.7 ng/mL — ABNORMAL HIGH (ref 0.3–4.0)
Relative Index: 1.5 (ref 0.0–2.5)
Total CK: 233 U/L — ABNORMAL HIGH (ref 7–232)
Total CK: 317 U/L — ABNORMAL HIGH (ref 7–232)
Troponin I: 0.01 ng/mL (ref 0.00–0.06)

## 2011-03-30 LAB — BASIC METABOLIC PANEL
BUN: 21 mg/dL (ref 6–23)
CO2: 26 mEq/L (ref 19–32)
Chloride: 105 mEq/L (ref 96–112)
Creatinine, Ser: 0.79 mg/dL (ref 0.4–1.5)
Glucose, Bld: 123 mg/dL — ABNORMAL HIGH (ref 70–99)
Potassium: 4.3 mEq/L (ref 3.5–5.1)

## 2011-03-30 LAB — LIPID PANEL
Triglycerides: 328 mg/dL — ABNORMAL HIGH (ref <150)
VLDL: 66 mg/dL — ABNORMAL HIGH (ref 0–40)

## 2011-03-30 LAB — DIFFERENTIAL
Basophils Relative: 1 % (ref 0–1)
Eosinophils Absolute: 0.4 10*3/uL (ref 0.0–0.7)
Eosinophils Relative: 4 % (ref 0–5)
Lymphs Abs: 2.8 10*3/uL (ref 0.7–4.0)
Neutrophils Relative %: 55 % (ref 43–77)

## 2011-03-30 LAB — POCT CARDIAC MARKERS
CKMB, poc: 1.9 ng/mL (ref 1.0–8.0)
Troponin i, poc: <0.05 ng/mL (ref 0.00–0.09)

## 2011-03-30 LAB — HEMOGLOBIN A1C: Hgb A1c MFr Bld: 6.2 % — ABNORMAL HIGH (ref 4.6–6.1)

## 2011-03-30 LAB — PROTIME-INR
INR: 0.9 (ref 0.00–1.49)
Prothrombin Time: 12.4 seconds (ref 11.6–15.2)

## 2011-04-14 ENCOUNTER — Other Ambulatory Visit: Payer: Self-pay | Admitting: *Deleted

## 2011-04-14 ENCOUNTER — Other Ambulatory Visit: Payer: Self-pay | Admitting: Family Medicine

## 2011-04-14 MED ORDER — GLUCOSE BLOOD VI STRP: ORAL_STRIP | Status: AC

## 2011-05-05 ENCOUNTER — Encounter: Payer: Self-pay | Admitting: Family Medicine

## 2011-05-06 NOTE — Discharge Summary (Signed)
NAME:  Nathan Fernandez, Nathan Fernandez NO.:  [id number]   MEDICAL RECORD NO.:  [id number]          PATIENT TYPE:  INP   LOCATION:  1428                         FACILITY:  [facility]   PHYSICIAN:  Willow Ora, MD           DATE OF BIRTH:  Sep 12, 1941   DATE OF ADMISSION:  08/04/2009  DATE OF DISCHARGE:  08/05/2009                               DISCHARGE SUMMARY   PRIMARY CARE PHYSICIAN:  Dr. Arta Silence at [facility] in  [place].   BRIEF HISTORY AND PHYSICAL:  Mr. [name] is 70 year old gentleman with  multiple cardiovascular risk factors who presented to the emergency room  yesterday with right facial weakness and was admitted to the hospital  for further evaluation.   PHYSICAL EXAM:  VITAL SIGNS:  Upon admission blood pressure was 148/95,  pulse 91, respiration 20, he was afebrile, pulse 94% on room air.  LUNGS:  Clear to auscultation bilaterally.  CARDIOVASCULAR:  Regular rate and rhythm without murmur.  ABDOMEN:  Benign.  NEUROLOGIC:  Exam he has some sensory loss in the right perioral area  and mandible area but no cranial deficits.  His gait is normal.  The  motor exam is normal as well.   LABORATORY AND X-RAYS:  MRI, MRA of the brain showed atrophy and small  vessel disease but no acute findings and the angiography of the  intracranial circulation was unremarkable.  Hemoglobin A1c was 6.2.  Cardiac enzymes showed a negative troponin three times, the CK was  slightly elevated.  Total cholesterol was 155, triglycerides 328, HDL  24, LDL 65, potassium 3.7, creatinine 0.7.  LFTs were normal.  CBC was  normal except for a hemoglobin of 17.6.   HOSPITAL COURSE:  The patient was admitted to the hospital with right  facial paresthesias and right facial droop.  Today on rounds his  forehead was also noted to be weaker, raising the suspicion of Bell's  palsy.  With that in mind neurology was consulted.  By the time of their  exam it was more evident that the patient's  problem was Bell's palsy.  He nevertheless got an MRI before the neurologic consult, and the  results are described above.  As far as the Bell's palsy he is ready to  be discharged.  We will not need to continue doing a stroke workup since  stroke and has not been the problem during this admission.  Additionally, his CK is a slightly elevated.  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  I have looked into the  inpatient and outpatient record and have not been able to find old EKGs  to compare.  Since his troponins were negative and he is asymptomatic I  think this needs to be followed up as an outpatient by his primary care  doctor.  At this point I think he got maximal hospital benefit.  Neurology definitely recommended steroids to help with the at Bell's  palsy.  I realize that the patient has diabetes and he does  not have  glucose monitor at present.  This is the reason why asked him to see his  primary care doctor next week.  He will be then discharged with the  following instructions.   DISCHARGE INSTRUCTIONS:  1. Continue with enalapril 10 mg daily.  2. Gemfibrozil 600 mg 2 times a day.  3. Naproxen 500 mg as needed.  4. Continue with trazodone 100 mg at night.  5. Continue with AndroGel as before.  6. Vicodin 5 mg one or two p.o. q.i.d. p.r.n. pain a prescription for      40 tablets was provided.  7. Prednisone 48 mg day one, the next day 30 mg, the next day 20 mg,      on the next day 10 mg.  A prescription was also issued.  8. The patient is instructed to call his primary care doctor next      week.  9. The patient is to alert the primary care doctor if there are any      symptoms related to high sugars such as being excessively thirsty      or increase in urine output.  10.He is advised to use a patch and ointment on the right eye for      protection.   ADMISSION DIAGNOSES:  Right facial weakness, possibly transient ischemic  attack.    DISCHARGE DIAGNOSES:  1. Bell's palsy.  2. History of high cholesterol.  3. History of coronary artery disease asymptomatic during this      admission, 3 troponin negative.  4. Tobacco abuse, counseled during this admission.  5. Hypertension.  6. Osteoarthritis.  7. Erectile dysfunction.      Willow Ora, MD  Electronically Signed     JP/MEDQ  D:  08/05/2009  T:  08/05/2009  Job:  [id number]   cc:   Arta Silence, MD  Fax: [phone]

## 2011-05-06 NOTE — Consult Note (Signed)
NAME:  Nathan Fernandez, Nathan Fernandez NO.:  [id number]   MEDICAL RECORD NO.:  [id number]          PATIENT TYPE:  INP   LOCATION:  1428                         FACILITY:  [facility]   PHYSICIAN:  Noel Christmas, MD    DATE OF BIRTH:  03/15/1941   DATE OF CONSULTATION:  08/05/2009  DATE OF DISCHARGE:                                 CONSULTATION   REASON FOR CONSULTATION:  Rule out Bell's palsy versus stroke.   HISTORY OF PRESENT ILLNESS:  This is a 70 year old man with a history of  onset of paresthesias and right-sided facial weakness on August 03, 2009.  He has also had pain below his right ear for about 1 week.  There  is no history of extremity weakness or sensory changes involving his  right hand.  He has had no change in hearing on the right side or any  change in taste on the right side of his tongue.  CT scan of his head as  well as MRI and MRA of his brain showed no acute intracranial  abnormality.  Small-vessel white matter ischemic changes were noted.  Weakness of his upper face is slightly more prominent today than  yesterday.  Otherwise, he is unchanged.   PAST MEDICAL HISTORY:  1. Coronary artery disease.  2. Hyperlipidemia.  3. Cataract surgery.  4. Tobacco abuse.  5. Hypertension.  6. Osteoarthritis.  7. Erectile dysfunction.  8. Partial thyroidectomy in 1967.  9. 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   MEDICATIONS:  1. Trazodone 100 mg nightly.  2. Lopid 600 mg per day.  3. Enalapril 1 per day.  4. Flexeril 10 mg t.i.d. p.r.n.  5. Fexofenadine 180 mg p.r.n. allergy symptoms.  6. AndroGel daily.  7. Aspirin 81 mg per day.  8. Multivitamin with B complex, ginseng, saw palmetto, ginkgo biloba      and cod liver oil daily.  9. Wellbutrin 150 mg twice a day.   FAMILY HISTORY:  Noncontributory.   PHYSICAL EXAMINATION:  GENERAL:  Appearance is that of an elderly man of  slender to medium build who was alert and  cooperative and in no acute  distress.  Mental status was normal.  HEENT/NEUROLOGIC:  Pupils, extraocular movements and visual fields were  normal.  He had slight numbness involving the right upper and lower  face.  He had mild frontalis weakness on the right.  There was moderate  weakness of his orbicularis oculi and nasalis muscles.  He had mild to  moderate weakness of the orbicularis oris and platysma muscles.  He had  no vesicles in his right ear canal.  Motor exam showed normal strength  and normal tone throughout.  Deep tendon reflexes were normal and  symmetrical.  Plantar responses were flexor.  Sensory examination was  normal except for reduced vibratory sensation distally in his left foot.  NECK:  Carotid auscultation was normal.   CLINICAL IMPRESSION:  1. Acute right Bell's palsy.  2. No clinical signs of acute stroke.   RECOMMENDATIONS:  1. Short course  of prednisone starting at 60 mg on day one, then taper      by 10 mg per day.  2. Ophthalmic ointment for right eye along with eye patch nightly      until he is able to maintain closure of his eye throughout the      night.  3. Follow up with primary physician in 2-4 weeks after discharge.  4. Neurology or ENT referral if Bell's palsy has not resolved in 4      weeks.   Thank you for asking me to evaluate Nathan Fernandez.      Noel Christmas, MD  Electronically Signed     CS/MEDQ  D:  08/05/2009  T:  08/05/2009  Job:  [id number]

## 2011-05-06 NOTE — H&P (Signed)
NAME:  Nathan Fernandez, Nathan Fernandez NO.:  [id number]   MEDICAL RECORD NO.:  [id number]          PATIENT TYPE:  INP   LOCATION:  1428                         Fernandez:  Nathan Fernandez   PHYSICIAN:  Nathan Fernandez, D.O.DATE OF BIRTH:  [birth year]   DATE OF ADMISSION:  08/04/2009  DATE OF DISCHARGE:                              HISTORY & PHYSICAL   PRIMARY CARE PHYSICIAN:  Dr. Laurita Quint MD.  This is an admission  for [facility].   HISTORY OF PRESENT ILLNESS:  [name] is a 70 year old man who  presented to the [facility] emergency department complaining of a right  facial numbness and facial droop.  Symptoms began intermittently several  days ago with worsening of symptoms the morning prior to this admission.  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  The symptoms were again intermittent.  There  were no other symptoms in the extremities.  He does have a history  significant for hyperlipidemia.  Past heart attack.  He is on daily  aspirin.  He has never had any TIA or stroke-like symptoms in the past.  He does not have any dental pain on the right side of his mouth or any  swelling.  He does note that he did have dental work several years ago  in that area and does have some nerve damage and a history of some  perioral numbness.   PAST MEDICAL HISTORY:  1. Coronary artery disease.  2. Hyperlipidemia.  3. History of cataract surgery.  4. Tobacco abuse.  5. Hypertension.  6. Osteoarthritis.  7. Erectile dysfunction.  8. History of partial thyroidectomy in 1967.  9. History of hospitalization for myocardial infarction in 1993 with      percutaneous angioplasty.  Last stress Myoview was in 2007 with     small distal intraseptal an apical infarcts.   MEDICATIONS:  1. Trazodone 100 mg tablets one p.o. q.h.s.  2. Approximate 500 mg one tablet p.o. b.i.d.  3. Lopid 600 mg one tablet p.o. daily.  4. Enalapril one tablet p.o. daily.  5. Flexeril one tablet p.o. p.r.n. for pain.  6. Fexofenadine 180 tablets p.o. p.r.n. for allergies.  7. AndroGel 50 mg, 5 mg once daily.  8. Aspirin 81 mg p.o. daily.  9. Multivitamin including B complex, vitamin E, Ginseng, saw palmetto,      ginkgo biloba, cod liver oil.  10.Wellbutrin 150 mg p.o. b.i.d.   ALLERGIES:  PENICILLIN.   FAMILY HISTORY:  His father died at age 85 of a stroke and high blood  pressure.  His mother died at the age of 60 of an aneurysm and  hypertension.  His brother is alive and well 44 with hypertension.  He  has a son who has a history of heart attack and  high blood pressure.  There is no history of cancer.  No history of drug or alcohol abuse.   SOCIAL HISTORY:  The patient is divorced, lives with partner.  There are  three living children:  Daughter who died at age of 18.  He is an  Art gallery manager for [employer].  He is an active daily smoker.  No  alcohol abuse.   REVIEW OF SYSTEMS:  Per HPI.   PHYSICAL EXAMINATION:  VITAL SIGNS:  Blood pressure 148/95, pulse 91,  respiratory rate 20, temperature 98.1, O2 sats 94% on room air.  GENERAL:  Well, appears stated age, well groomed 70 year old gentleman  in no acute distress.  He is alert, appropriate, cooperative throughout  the examination.  HEENT:  Normocephalic, atraumatic.  Eyes: Conjunctiva clear.  Visual  field fields are full.  Extraocular muscles are intact.  Nose:  External  examination is normal.  Mouth:  There is perioral numbness, mostly on  the right compared to left.  There is a mild right facial droop.  His  tongue is midline.  His uvula is midline.  NECK:  Supple.  There are no carotid bruits.  There are no masses or  lesions palpated.  No  thyromegaly, no adenopathy.  CHEST:  Wall with no deformities.  LUNGS:  Clear to auscultation.  HEART:  Regular rate and rhythm.  Normal S1-S2.  ABDOMEN:  Bowel sounds are positive.  Abdomen is soft, nontender.  EXTREMITIES:  There is no clubbing, cyanosis or edema.  He has normal  strength bilaterally in his upper and lower extremities.  NEUROLOGIC:  Other than right sensory loss to his right perioral area and mandibular  area, he no cranial nerve deficits.  His gait is normal.  He has normal  strength 5/5 bilaterally in his upper and lower extremities.  There is  no sensory loss in his upper and lower extremities.  He had a full range  of motion in all of his joints.  SKIN:  Intact without lesions or rashes.   ASSESSMENT/PLAN:  1. TIA, rule out stroke.  [name] is a 70 year old gentleman has      multiple risk factors for stroke including active tobacco use,      hyperlipidemia, hypertension and a history of MI as well as a      family history significant for stroke in his father when his father      was 71 years old.  His symptoms are mostly isolated to his right      facial region including a facial droop.  Symptoms are intermittent      suggesting TIA.  Will admit for complete stroke workup including      determination of source of stroke.  Will get a 2-D echo.  Will      watch him on telemetry to make sure he does not have any heart      arrhythmias contributing to his symptoms.  He will also get      bilateral carotid Dopplers and MRI of his brain.  He was on 81 mg      of aspirin prior to coming into the hospital.  Will increase this      to full strength aspirin and consider starting Plavix in this      patient.  We will have speech and language assess the sensory loss      of his face.  We will do a RN stroke swallow screening and resume a  normal diet.  Focus will be on aggressive secondary risk factor      reduction.  2. Hypertension.  We will continue his home  enalapril.  3. Hyperlipidemia.  Will continue his Lopid.  Currently noted that he      is not on a statin.  Will need to review the outpatient records to      determine if he has statin intolerability.  We will also check a      fasting lipid panel.  If there are no contraindications, the      patient would probably benefit from the addition of a statin to his      medication regimen.  4. Coronary artery disease, history of MI.  The patient has what      appears to be a new right bundle branch block on his EKG.  Will      monitor him on tele and cycle his cardiac enzymes.  He does not      have any active chest pain.      He may need a stress test as an outpatient.  His last stress test      was in 2007 which showed old irreversible small infarction on his      previous MI.  5. Tobacco abuse.  We will aggressively try to encourage smoking      cessation.      Nathan Fernandez, D.O.     Dorena Dew  D:  08/05/2009  T:  08/05/2009  Job:  [id number]

## 2011-05-07 ENCOUNTER — Ambulatory Visit (INDEPENDENT_AMBULATORY_CARE_PROVIDER_SITE_OTHER): Payer: Medicare Other | Admitting: Family Medicine

## 2011-05-07 ENCOUNTER — Encounter: Payer: Self-pay | Admitting: Family Medicine

## 2011-05-07 DIAGNOSIS — M5430 Sciatica, unspecified side: Secondary | ICD-10-CM

## 2011-05-07 DIAGNOSIS — M1990 Unspecified osteoarthritis, unspecified site: Secondary | ICD-10-CM

## 2011-05-07 DIAGNOSIS — I10 Essential (primary) hypertension: Secondary | ICD-10-CM

## 2011-05-07 DIAGNOSIS — F17200 Nicotine dependence, unspecified, uncomplicated: Secondary | ICD-10-CM

## 2011-05-07 DIAGNOSIS — R12 Heartburn: Secondary | ICD-10-CM | POA: Insufficient documentation

## 2011-05-07 NOTE — Assessment & Plan Note (Signed)
Discussed pathophysiology and hygiene to avoid.  GERD can be treated with medication but also with lifestyle changes including avoidance of late meals, excess alcohol, smoking cessation, avoiding spicy foods, chocolate, peppermint, colas, red wine and  acidic juices such as orange or tomato juice. Do not take mint or menthol products, use sugarless candy instead (Jolly Ranchers)

## 2011-05-07 NOTE — Assessment & Plan Note (Signed)
Well controlled. This does not appear involved in any of the above complaints.

## 2011-05-07 NOTE — Progress Notes (Signed)
  Subjective:    Patient ID: Nathan Fernandez, male    DOB: [birth year], 70 y.o.   MRN: [id number]  HPI 1.Pt here as acute visit for memory issues, lightheadedness, stumbling and retaining fluid. He had a hole in one in Feb at [place]. He is getting old with ailments. He has slight memory difficulties, nothing real serious...like putting something down, going out of the room and then forgetting where he put it.  2. He is still taking Zyban and still enjoys a cigar 2-3 times a week. We discussed smoking in relation to vascular health and its relationship to dementia, CAD, H/As, Fluid and indigestion. 3. He used to have headaches real bad and they resolved and now has a bad one occas. They are typically behind the left eye and last 10-20 mins, typically doesn't take anything but they resolve.  4. He has pain in his left shoulder Sun. And pain in the back. He occas takes Flexeril. These were reasonably isolated episodes. He plays golf routinely.  5. Lately he thinks he is retaining fluid in his hands and wrists. He has noticed esp in the AM he urinates first thing and then checks his sugar. After brfst he then urinates again. Then not much after that. Discussed increasing fluid intake. Also discussed avoiding salt..he admits to lots of salt use altho he has decreased lately.  6. Lastly, he loses his balance for a second or two occasionally. This may or may not be associated with changing position. 7. Also occas pretty constantly has indigestion.      Review of Systems  Constitutional: Negative for fever, chills, diaphoresis, activity change, appetite change and fatigue.  HENT: Negative for hearing loss, ear pain, congestion, sore throat, rhinorrhea, neck pain, neck stiffness, postnasal drip, sinus pressure, tinnitus and ear discharge.   Eyes: Negative for pain, discharge and visual disturbance.  Respiratory: Negative for cough, shortness of breath and wheezing.   Cardiovascular: Negative for chest pain  and palpitations.       No SOB w/ exertion  Gastrointestinal:       No heartburn or swallowing problems.  Genitourinary:       No nocturia  Skin:       No itching or dryness.  Neurological:       No tingling.  All other systems reviewed and are negative.       Objective:   Physical Exam  Constitutional: He appears well-developed and well-nourished. No distress.  HENT:  Head: Normocephalic and atraumatic.  Right Ear: External ear normal.  Left Ear: External ear normal.  Nose: Nose normal.  Mouth/Throat: Oropharynx is clear and moist.  Eyes: Conjunctivae and EOM are normal. Pupils are equal, round, and reactive to light. Right eye exhibits no discharge. Left eye exhibits no discharge.  Neck: Normal range of motion. Neck supple.  Cardiovascular: Normal rate and regular rhythm.   Pulmonary/Chest: Effort normal and breath sounds normal. He has no wheezes.  Lymphadenopathy:    He has no cervical adenopathy.  Skin: He is not diaphoretic.          Assessment & Plan:

## 2011-05-07 NOTE — Assessment & Plan Note (Addendum)
Same as above. No acute findings or red flags noted.

## 2011-05-07 NOTE — Patient Instructions (Signed)
GERD can be treated with medication but also with lifestyle changes including avoidance of late meals, excess alcohol, smoking cessation, avoiding spicy foods, chocolate, peppermint, colas, red wine and  acidic juices such as orange or tomato juice. Do not take mint or menthol products, use sugarless candy instead (Jolly Ranchers) Increase fluids.

## 2011-05-07 NOTE — Assessment & Plan Note (Signed)
Discussed avoiding nicotine/tar/carbon monoxide altogether.

## 2011-05-07 NOTE — Assessment & Plan Note (Addendum)
Evidence of past back disease. Discussed exercises to stabilize/ strengthen  the back.

## 2011-05-09 NOTE — Op Note (Signed)
NAME:  Nathan Fernandez, Nathan Fernandez NO.:  [id number]   MEDICAL RECORD NO.:  [id number]          PATIENT TYPE:  AMB   LOCATION:  SDS                          FACILITY:  [facility]   PHYSICIAN:  Requan D. Ashley Royalty, M.D. DATE OF BIRTH:  1941-04-06   DATE OF PROCEDURE:  07/02/2006  DATE OF DISCHARGE:                                 OPERATIVE REPORT   ADMISSION DIAGNOSIS:  Dislocated intraocular lens into the vitreous, right  eye.   PROCEDURES:  Pars plana vitrectomy with 25-gauge system, removal of  intraocular lens from the vitreous, placement of secondary intraocular lens  with suture, all on the right eye, and retinal photocoagulation, right eye.   SURGEON:  Dr. Alan Mulder.   ASSISTANT:  Rosalie Doctor, MA.   ANESTHESIA:  General.   DETAILS:  Usual prep and drape.  The indirect ophthalmoscope laser was moved  into place, 990 burns placed around the retinal periphery with a power of  440 milliwatts, 1000 microns each and 0.05 seconds each.  Once this was  accomplished, the attention was carried to the limbal area where peritomy  was performed from 8 o'clock around to 4 o'clock.  Previous Prolene suture  was removed.  The scleral flaps were raised, at 4 o'clock and 10 o'clock, in  anticipation of IOL suture.  A 25-gauge trocars placed at 8, 10 and 2  o'clock.  Infusion at 8 o'clock.  Provisc placed on the corneal surface.  A  three layer corneal wound was created from 10 o'clock to 2 o'clock.  The  pars plana vitrectomy was begun just behind the pupillary axis.  Pigment and  debris was removed from the vitreous cavity, as well as some of vitreous  strands.  The attention was carried to the 6 o'clock area where the  intraocular lens was ensnared in vitreous.  The the haptic was removed from  the vitreous with the vitrectomy cutter.  The lens was allowed to fall into  the back of the eye overlying the macular region.  The corneoscleral wound  was opened.  The lens was passed, with  25-gauge forceps, through the small  pupil into the anterior chamber and out the corneoscleral wound.  Two 10-0  Prolene sutures were then placed beneath scleral flaps, at 4 and 10 o'clock.  Sutures were externalized.  A new intraocular lens was brought onto the  field; model CZ70BD, [employer] power 23.0 D, length 12.5 mm,  optic 7.0 mm, serial number [id number], expiration date January 10, 2011.  The lens was brought onto the field, inspected and cleaned.  Sutures were  attached to the eyelets of the lens with triple knots.  The implant was then  placed through the corneoscleral wound into the anterior chamber through the  pupil, and into the posterior chamber.  It was dialed into place as the  sutures were pulled externally.  They were knotted externally beneath the  flaps, and the flaps were allowed to cover the knots.  The corneoscleral  wound was closed with 10-0 interrupted suture, four in number.  The wound  was  tested and found to be tight.  A 20% gas/fluid exchange was performed.  The trocars were removed and wounds were held until tight.  Conjunctiva was  closed with 7-0 chromic suture.  Polymyxin and gentamicin were irrigated  into tenons space.  Decadron 10 mg was injected to the lower subconjunctival  space.  Marcaine was injected around the globe for postop pain.  TobraDex  ophthalmic ointment, a patch and shield were placed.  The closing tension  was 10 with a Risk manager.  The patient was awakened and taken to  recovery in satisfactory condition.   OPERATIVE TIME:  1-1/4 hours.   COMPLICATIONS:  None.      Beulah Gandy. Ashley Royalty, M.D.  Electronically Signed     JDM/MEDQ  D:  07/02/2006  T:  07/02/2006  Job:  [id number]

## 2011-05-20 ENCOUNTER — Other Ambulatory Visit: Payer: Self-pay | Admitting: Family Medicine

## 2011-05-21 ENCOUNTER — Other Ambulatory Visit (INDEPENDENT_AMBULATORY_CARE_PROVIDER_SITE_OTHER): Payer: Medicare Other | Admitting: Family Medicine

## 2011-05-21 ENCOUNTER — Other Ambulatory Visit: Payer: Self-pay

## 2011-05-21 DIAGNOSIS — E119 Type 2 diabetes mellitus without complications: Secondary | ICD-10-CM

## 2011-05-28 ENCOUNTER — Ambulatory Visit: Payer: Self-pay | Admitting: Family Medicine

## 2011-06-09 ENCOUNTER — Other Ambulatory Visit: Payer: Self-pay | Admitting: *Deleted

## 2011-06-09 MED ORDER — BUPROPION HCL ER (SR) 150 MG PO TB12: ORAL_TABLET | ORAL | Status: DC

## 2011-07-02 ENCOUNTER — Encounter: Payer: Self-pay | Admitting: Family Medicine

## 2011-07-02 ENCOUNTER — Other Ambulatory Visit: Payer: Self-pay | Admitting: Family Medicine

## 2011-07-02 ENCOUNTER — Ambulatory Visit (INDEPENDENT_AMBULATORY_CARE_PROVIDER_SITE_OTHER): Payer: Medicare Other | Admitting: Family Medicine

## 2011-07-02 DIAGNOSIS — F17200 Nicotine dependence, unspecified, uncomplicated: Secondary | ICD-10-CM

## 2011-07-02 DIAGNOSIS — F329 Major depressive disorder, single episode, unspecified: Secondary | ICD-10-CM

## 2011-07-02 DIAGNOSIS — E119 Type 2 diabetes mellitus without complications: Secondary | ICD-10-CM

## 2011-07-02 DIAGNOSIS — I10 Essential (primary) hypertension: Secondary | ICD-10-CM

## 2011-07-02 DIAGNOSIS — F3289 Other specified depressive episodes: Secondary | ICD-10-CM

## 2011-07-02 MED ORDER — METFORMIN HCL 500 MG PO TABS: 500.0000 mg | ORAL_TABLET | Freq: Every day | ORAL | Status: DC

## 2011-07-02 MED ORDER — TRAZODONE HCL 100 MG PO TABS: 100.0000 mg | ORAL_TABLET | Freq: Every day | ORAL | Status: DC

## 2011-07-02 MED ORDER — BUPROPION HCL ER (SR) 150 MG PO TB12: 150.0000 mg | ORAL_TABLET | Freq: Two times a day (BID) | ORAL | Status: DC

## 2011-07-02 NOTE — Patient Instructions (Addendum)
I suggest Zostavax. Grtr than 50% of time spent in counselling, spent with pt.

## 2011-07-02 NOTE — Assessment & Plan Note (Signed)
Wants refill for Bupropion. Think he uses this more for smoking.

## 2011-07-02 NOTE — Assessment & Plan Note (Signed)
Stable and well controlled. BP Readings from Last 3 Encounters:  07/02/11 120/72  05/07/11 118/70  11/28/10 124/84

## 2011-07-02 NOTE — Assessment & Plan Note (Signed)
Am not sure if the pt is still in denial or if he merely is noncompliant. Again discussed risks and benefits of good control vs bad control and diet and exercise to effect both.  Cont on Metformin as ordered. Try to be more careful of what he eats.  A1C 6.4 from 6.1 last time.

## 2011-07-02 NOTE — Progress Notes (Signed)
  Subjective:    Patient ID: Nathan Fernandez, male    DOB: 1941-07-20, 70 y.o.   MRN: [id number]  HPI Pt here for 6 month followup. He has no complaints except back pain. He is going back to work. He had been retired but was called back and will be going up to [employer] for 6 months. He is here for followup. He has realized that pasta causes his sugar to elevate. We again discussed dietary direction and avoiding sweets and carbs;   Review of Systems  Constitutional: Negative for fever, chills, diaphoresis, activity change, appetite change and fatigue.  HENT: Negative for hearing loss, ear pain, congestion, sore throat, rhinorrhea, neck pain, neck stiffness, postnasal drip, sinus pressure, tinnitus and ear discharge.   Eyes: Negative for pain, discharge and visual disturbance.  Respiratory: Negative for cough, shortness of breath and wheezing.   Cardiovascular: Negative for chest pain and palpitations.       No SOB w/ exertion  Gastrointestinal:       No heartburn or swallowing problems.  Genitourinary:       No nocturia  Skin:       No itching or dryness.  Neurological:       No tingling or balance problems.  All other systems reviewed and are negative.       Objective:   Physical Exam  Constitutional: He appears well-developed and well-nourished. No distress.  HENT:  Head: Normocephalic and atraumatic.  Right Ear: External ear normal.  Left Ear: External ear normal.  Nose: Nose normal.  Mouth/Throat: Oropharynx is clear and moist.  Eyes: Conjunctivae and EOM are normal. Pupils are equal, round, and reactive to light. Right eye exhibits no discharge. Left eye exhibits no discharge.  Neck: Normal range of motion. Neck supple.  Cardiovascular: Normal rate and regular rhythm.   Pulmonary/Chest: Effort normal and breath sounds normal. He has no wheezes.  Lymphadenopathy:    He has no cervical adenopathy.  Skin: He is not diaphoretic.          Assessment & Plan:

## 2011-07-02 NOTE — Assessment & Plan Note (Signed)
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 Discussed avoiding them altogether.

## 2011-07-03 ENCOUNTER — Telehealth: Payer: Self-pay | Admitting: *Deleted

## 2011-07-03 NOTE — Telephone Encounter (Signed)
Signed.

## 2011-07-03 NOTE — Telephone Encounter (Signed)
[facility] has faxed a form requesting order for zostavax, form is on your desk.

## 2011-07-03 NOTE — Telephone Encounter (Signed)
Form completed, faxed back to [facility].

## 2011-12-29 ENCOUNTER — Telehealth: Payer: Self-pay | Admitting: *Deleted

## 2011-12-29 DIAGNOSIS — E119 Type 2 diabetes mellitus without complications: Secondary | ICD-10-CM

## 2011-12-29 NOTE — Telephone Encounter (Signed)
Yes.  Lab orders are in.

## 2011-12-29 NOTE — Telephone Encounter (Signed)
Patient has an appointment scheduled with you on 01/07/12 and wants to know if he needs to have any lab work prior to that appointment? Please advise.

## 2011-12-30 NOTE — Telephone Encounter (Signed)
Patient advised.  Lab appt scheduled.  

## 2012-01-02 ENCOUNTER — Other Ambulatory Visit (INDEPENDENT_AMBULATORY_CARE_PROVIDER_SITE_OTHER): Payer: Medicare Other

## 2012-01-02 DIAGNOSIS — E119 Type 2 diabetes mellitus without complications: Secondary | ICD-10-CM

## 2012-01-02 LAB — COMPREHENSIVE METABOLIC PANEL
ALT: 32 U/L (ref 0–53)
AST: 27 U/L (ref 0–37)
Creatinine, Ser: 1 mg/dL (ref 0.4–1.5)
Total Bilirubin: 0.7 mg/dL (ref 0.3–1.2)

## 2012-01-02 LAB — LIPID PANEL
Cholesterol: 176 mg/dL (ref 0–200)
HDL: 32.4 mg/dL — ABNORMAL LOW (ref >39.00)
Total CHOL/HDL Ratio: 5
Triglycerides: 274 mg/dL — ABNORMAL HIGH (ref 0.0–149.0)
VLDL: 54.8 mg/dL — ABNORMAL HIGH (ref 0.0–40.0)

## 2012-01-07 ENCOUNTER — Encounter: Payer: Self-pay | Admitting: Family Medicine

## 2012-01-07 ENCOUNTER — Ambulatory Visit (INDEPENDENT_AMBULATORY_CARE_PROVIDER_SITE_OTHER): Payer: Medicare Other | Admitting: Family Medicine

## 2012-01-07 VITALS — BP 140/86 | HR 88 | Temp 98.2°F | Wt 195.2 lb

## 2012-01-07 DIAGNOSIS — Z23 Encounter for immunization: Secondary | ICD-10-CM

## 2012-01-07 DIAGNOSIS — E7800 Pure hypercholesterolemia, unspecified: Secondary | ICD-10-CM

## 2012-01-07 DIAGNOSIS — F17200 Nicotine dependence, unspecified, uncomplicated: Secondary | ICD-10-CM

## 2012-01-07 DIAGNOSIS — I10 Essential (primary) hypertension: Secondary | ICD-10-CM

## 2012-01-07 DIAGNOSIS — E119 Type 2 diabetes mellitus without complications: Secondary | ICD-10-CM

## 2012-01-07 MED ORDER — NITROGLYCERIN 0.4 MG SL SUBL: SUBLINGUAL_TABLET | SUBLINGUAL | Status: DC

## 2012-01-07 MED ORDER — ENALAPRIL MALEATE 10 MG PO TABS: 10.0000 mg | ORAL_TABLET | Freq: Every day | ORAL | Status: DC

## 2012-01-07 MED ORDER — GEMFIBROZIL 600 MG PO TABS: 600.0000 mg | ORAL_TABLET | Freq: Two times a day (BID) | ORAL | Status: DC

## 2012-01-07 MED ORDER — CYCLOBENZAPRINE HCL 10 MG PO TABS: 10.0000 mg | ORAL_TABLET | Freq: Three times a day (TID) | ORAL | Status: DC | PRN

## 2012-01-07 MED ORDER — TRAZODONE HCL 100 MG PO TABS: 100.0000 mg | ORAL_TABLET | Freq: Every day | ORAL | Status: DC

## 2012-01-07 NOTE — Patient Instructions (Signed)
I would get a flu shot each fall.   Check with your insurance to see if they will cover the shingles shot. Stop the metformin and zyban.  Recheck your labs in 6 months, visit a few days later.  If your sugar gets consistently above 150, then let me know.   Try clarin for the runny nose, once a day.

## 2012-01-07 NOTE — Progress Notes (Signed)
Diabetes:  Using medications without difficulties: he was off metformin for last 1.5 months.   Hypoglycemic episodes: no Hyperglycemic episodes: no Feet problems: no Blood Sugars averaging: Am checks- 115-120s eye exam within last year: due for recheck and this was discussed.   He's smoking cigars, thinking about quitting, on wellbutrin for cigarettes and that helped but this didn't help with the cigars.    He has occ hand tremor during periods of stress.  No sx today.  He agreed to follow this and notify me of changes.  H/o tremor in family.   Elevated Cholesterol: Using medications without problems:yes Muscle aches: no Diet compliance:yes Exercise:some, limited by weather  Hypertension:  Using medication without problems or lightheadedness: yes Chest pain with exertion:no Edema:no Short of breath:no Needs refill on NTG.   PMH and SH reviewed  Meds, vitals, and allergies reviewed.   ROS: See HPI.  Otherwise negative.    GEN: nad, alert and oriented HEENT: mucous membranes moist NECK: supple w/o LA CV: rrr. PULM: ctab, no inc wob ABD: soft, +bs EXT: no edema SKIN: no acute rash No tremor  Diabetic foot exam: Normal inspection No skin breakdown No calluses  Normal DP pulses Normal sensation to light touch and monofilament Nails normal  A&O x3, no deficit on concentration, registration, 2/3 on recall.

## 2012-01-08 ENCOUNTER — Encounter: Payer: Self-pay | Admitting: Family Medicine

## 2012-01-08 NOTE — Assessment & Plan Note (Signed)
Stop metformin.  Labs d/w pt.  Recheck labs later in 2013.  Continue with diet and exercise.  >25 min spent with face to face with patient, >50% counseling and/or coordinating care.

## 2012-01-08 NOTE — Assessment & Plan Note (Signed)
Cont current meds for now.  Diet and exercise discussed.

## 2012-01-08 NOTE — Assessment & Plan Note (Signed)
Cont current meds for now.  Diet and exercise discussed.  

## 2012-01-08 NOTE — Assessment & Plan Note (Signed)
D/w pt about cessation.  He wasn't getting help with zyban, so this was stopped

## 2012-03-25 ENCOUNTER — Emergency Department (HOSPITAL_COMMUNITY)
Admission: EM | Admit: 2012-03-25 | Discharge: 2012-03-25 | Disposition: A | Discharge status: Home / Self Care | Payer: Medicare Other | Attending: Emergency Medicine | Admitting: Emergency Medicine

## 2012-03-25 ENCOUNTER — Telehealth: Payer: Self-pay | Admitting: *Deleted

## 2012-03-25 ENCOUNTER — Telehealth: Payer: Self-pay | Admitting: Family Medicine

## 2012-03-25 ENCOUNTER — Encounter (HOSPITAL_COMMUNITY): Payer: Self-pay | Admitting: Emergency Medicine

## 2012-03-25 ENCOUNTER — Emergency Department (HOSPITAL_COMMUNITY): Payer: Medicare Other

## 2012-03-25 DIAGNOSIS — M19079 Primary osteoarthritis, unspecified ankle and foot: Secondary | ICD-10-CM

## 2012-03-25 DIAGNOSIS — M79609 Pain in unspecified limb: Secondary | ICD-10-CM | POA: Insufficient documentation

## 2012-03-25 DIAGNOSIS — Z7982 Long term (current) use of aspirin: Secondary | ICD-10-CM | POA: Insufficient documentation

## 2012-03-25 DIAGNOSIS — M7989 Other specified soft tissue disorders: Secondary | ICD-10-CM | POA: Insufficient documentation

## 2012-03-25 DIAGNOSIS — I2510 Atherosclerotic heart disease of native coronary artery without angina pectoris: Secondary | ICD-10-CM | POA: Insufficient documentation

## 2012-03-25 DIAGNOSIS — Z79899 Other long term (current) drug therapy: Secondary | ICD-10-CM | POA: Insufficient documentation

## 2012-03-25 MED ORDER — INDOMETHACIN 50 MG PO CAPS: 50.0000 mg | ORAL_CAPSULE | Freq: Three times a day (TID) | ORAL | Status: DC

## 2012-03-25 MED ORDER — INDOMETHACIN 50 MG PO CAPS
50.0000 mg | ORAL_CAPSULE | Freq: Once | ORAL | Status: AC
  Administered 2012-03-25: 50 mg via ORAL
  Filled 2012-03-25: qty 1

## 2012-03-25 MED ORDER — HYDROCODONE-ACETAMINOPHEN 5-325 MG PO TABS: 1.0000 | ORAL_TABLET | Freq: Once | ORAL | Status: AC

## 2012-03-25 MED ORDER — HYDROCODONE-ACETAMINOPHEN 5-325 MG PO TABS
1.0000 | ORAL_TABLET | Freq: Once | ORAL | Status: AC
  Administered 2012-03-25: 1 via ORAL
  Filled 2012-03-25: qty 1

## 2012-03-25 NOTE — Telephone Encounter (Signed)
I would take the pain meds for the next few days.  If totally resolved, no f/u needed.  If recurrent or not fully resolved, then f/u next week.  Thanks.

## 2012-03-25 NOTE — Telephone Encounter (Signed)
Wife advised. 

## 2012-03-25 NOTE — ED Notes (Signed)
Pt stated that the joint on r/foot-great toe,  is swollen with limited ROM

## 2012-03-25 NOTE — Telephone Encounter (Signed)
Patient was seen in the ER today, spouse wants to know when he should schedule f/u appt.  Please advise.

## 2012-03-25 NOTE — Telephone Encounter (Signed)
Thanks

## 2012-03-25 NOTE — ED Provider Notes (Signed)
History     CSN: [id number]  Arrival date & time 03/25/12  1123   First MD Initiated Contact with Patient 03/25/12 1127      Chief Complaint  Patient presents with  . Foot Pain    Pt reports pain in jioint of first toe of r/foot.Pt was walking all day in soft shoes    (Consider location/radiation/quality/duration/timing/severity/associated sxs/prior treatment) HPI Comments: Pt reports he walked a lot yesterday and today developed pain and swelling over his 1st MTP joint on his right foot.  Pain is exacerbated by palpation, walking, pressure.  Pain is 5/10. Has not taken any medication for pain.  Denies injury/trauma to the area, fever.  Does not have a history of gout but his son does.  Denies weakness or numbness.   Patient is a 71 y.o. male presenting with lower extremity pain. The history is provided by the patient and the spouse.  Foot Pain This is a new problem. The current episode started yesterday. The problem occurs constantly. The problem has been unchanged. Pertinent negatives include no fever. The symptoms are aggravated by walking and standing. He has tried nothing for the symptoms.    Past Medical History  Diagnosis Date  . Coronary artery disease   . History of ETT 1998    wnl  . 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    Hosp R facial weakness  . History of MRI 08/04/09    brain- atrophy sm vess dz  . CAD (coronary artery disease)     MI, Hosp 1993, PTCA    Past Surgical History  Procedure Date  . Thyroidectomy, partial 1967    B9 growth  . Cataract surgery 09/06    repair lens which moved  . Stress myoview 06/29/06    sm distal anteroseptal & apical infarct    Family History  Problem Relation Age of Onset  . Hypertension Mother   . Aneurysm Mother   . Hypertension Father   . Stroke Father   . Heart disease Father     CAD  . Heart disease Son     MI    History  Substance Use Topics  . Smoking status: Current Some Day Smoker    Types: Cigars  . Smokeless  tobacco: Never Used   Comment: cigar occassionally  . Alcohol Use: No      Review of Systems  Constitutional: Negative for fever.  All other systems reviewed and are negative.    Allergies  Penicillins  Home Medications   Current Outpatient Rx  Name Route Sig Dispense Refill  . ASPIRIN 81 MG PO TBEC Oral Take 81 mg by mouth daily.      . CYCLOBENZAPRINE HCL 10 MG PO TABS Oral Take 1 tablet (10 mg total) by mouth 3 (three) times daily as needed. 90 tablet 3  . ENALAPRIL MALEATE 10 MG PO TABS Oral Take 1 tablet (10 mg total) by mouth daily. 90 tablet 3  . GEMFIBROZIL 600 MG PO TABS Oral Take 1 tablet (600 mg total) by mouth 2 (two) times daily. 180 tablet 3  . GINKGO BILOBA 40 MG PO TABS Oral Take by mouth daily.      Marland Kitchen GINSENG 250 MG PO CAPS Oral Take by mouth daily.      Marland Kitchen ONE-DAILY MULTI VITAMINS PO TABS Oral Take 1 tablet by mouth daily.      Marland Kitchen NITROGLYCERIN 0.4 MG SL SUBL  Place one under tongue at onset of chest pain, may repeat in 5 minutes  25 tablet 12  . NON FORMULARY  Lipozene, take two by mouth two times a day    . FISH OIL 1000 MG PO CAPS Oral Take by mouth daily.      Marland Kitchen PROSTATE PO  Take two tabs twice daily     . TRAZODONE HCL 100 MG PO TABS Oral Take 1 tablet (100 mg total) by mouth at bedtime. 90 tablet 3  . VITAMIN E 400 UNITS PO CAPS Oral Take 400 Units by mouth daily.      . B-100 COMPLEX PO Oral Take by mouth daily.      Marland Kitchen GLUCOSE BLOOD VI STRP  Use as instructed 50 each 12  . ONETOUCH ULTRASOFT LANCETS MISC Other 1 each by Other route daily. Use as instructed       BP 121/69  Pulse 85  Temp(Src) 97.6 F (36.4 C) (Oral)  Resp 18  SpO2 96%  Physical Exam  Nursing note and vitals reviewed. Constitutional: He is oriented to person, place, and time. He appears well-developed and well-nourished.  HENT:  Head: Normocephalic and atraumatic.  Neck: Neck supple.  Pulmonary/Chest: Effort normal.  Musculoskeletal:       Feet:  Neurological: He is alert  and oriented to person, place, and time.    ED Course  Procedures (including critical care time)  Labs Reviewed - No data to display Dg Foot Complete Right  03/25/2012  *RADIOLOGY REPORT*  Clinical Data: Joint swelling, pain.  RIGHT FOOT COMPLETE - 3+ VIEW  Comparison: None.  Findings: Degenerative changes in the first MTP joint.  Remainder the joint spaces are maintained.  Plantar calcaneal spur. No acute bony abnormality.  Specifically, no fracture, subluxation, or dislocation.  Soft tissues are intact.  IMPRESSION: No acute bony abnormality.  Original Report Authenticated By: Cyndie Chime, M.D.    12:06 PM Discussed patient with Dr Oletta Lamas.     1. Arthritis of first MTP joint       MDM  Patient with pain, swelling, mild redness of right first MTP joint.  Gout vs arthritis.  Pt has son with hx gout.  No fevers, no injury - doubt septic joint, doubt occult fracture.  Pt d/c home with NSAIDs and pain medications, PCP follow up.  Patient verbalizes understanding and agrees with plan.          Dillard [place], [place] 03/25/12 [phone]

## 2012-03-25 NOTE — Telephone Encounter (Signed)
Pt was calling concerning his foot. He [place] the [place] and he woke up with the bottom of his foot swollen and his big toe hurting to the point that he can't walk. Discussed this with Rena and we both talked with Dr .Para March about what we should advise pt to do. Because of X-Ray being down this afternoon and there was only a 4:15 pm left Dr. Para March advised him to the ER this morning. Discussed this with pt and he was ok with this and will go this morning to [facility]. Pt will call back if needs to.

## 2012-03-25 NOTE — Discharge Instructions (Signed)
Read the information below.  Please call your doctor for a follow up appointment.  Use the medications as prescribed for pain.  Do not take any additional tylenol while taking the Norco (hydrocodone-acetaminophen). You may return to the ER at any time for worsening condition or any new symptoms that concern you.   Arthritis, Nonspecific Arthritis is inflammation of a joint. This usually means pain, redness, warmth or swelling are present. One or more joints may be involved. There are a number of types of arthritis. Your caregiver may not be able to tell what type of arthritis you have right away. CAUSES  The most common cause of arthritis is the wear and tear on the joint (osteoarthritis). This causes damage to the cartilage, which can break down over time. The knees, hips, back and neck are most often affected by this type of arthritis. Other types of arthritis and common causes of joint pain include:  Sprains and other injuries near the joint. Sometimes minor sprains and injuries cause pain and swelling that develop hours later.   Rheumatoid arthritis. This affects hands, feet and knees. It usually affects both sides of your body at the same time. It is often associated with chronic ailments, fever, weight loss and general weakness.   Crystal arthritis. Gout and pseudo gout can cause occasional acute severe pain, redness and swelling in the foot, ankle, or knee.   Infectious arthritis. Bacteria can get into a joint through a break in overlying skin. This can cause infection of the joint. Bacteria and viruses can also spread through the blood and affect your joints.   Drug, infectious and allergy reactions. Sometimes joints can become mildly painful and slightly swollen with these types of illnesses.  SYMPTOMS   Pain is the main symptom.   Your joint or joints can also be red, swollen and warm or hot to the touch.   You may have a fever with certain types of arthritis, or even feel overall  ill.   The joint with arthritis will hurt with movement. Stiffness is present with some types of arthritis.  DIAGNOSIS  Your caregiver will suspect arthritis based on your description of your symptoms and on your exam. Testing may be needed to find the type of arthritis:  Blood and sometimes urine tests.   X-ray tests and sometimes CT or MRI scans.   Removal of fluid from the joint (arthrocentesis) is done to check for bacteria, crystals or other causes. Your caregiver (or a specialist) will numb the area over the joint with a local anesthetic, and use a needle to remove joint fluid for examination. This procedure is only minimally uncomfortable.   Even with these tests, your caregiver may not be able to tell what kind of arthritis you have. Consultation with a specialist (rheumatologist) may be helpful.  TREATMENT  Your caregiver will discuss with you treatment specific to your type of arthritis. If the specific type cannot be determined, then the following general recommendations may apply. Treatment of severe joint pain includes:  Rest.   Elevation.   Anti-inflammatory medication (for example, ibuprofen) may be prescribed. Avoiding activities that cause increased pain.   Only take over-the-counter or prescription medicines for pain and discomfort as recommended by your caregiver.   Cold packs over an inflamed joint may be used for 10 to 15 minutes every hour. Hot packs sometimes feel better, but do not use overnight. Do not use hot packs if you are diabetic without your caregiver's permission.   A  cortisone shot into arthritic joints may help reduce pain and swelling.   Any acute arthritis that gets worse over the next 1 to 2 days needs to be looked at to be sure there is no joint infection.  Long-term arthritis treatment involves modifying activities and lifestyle to reduce joint stress jarring. This can include weight loss. Also, exercise is needed to nourish the joint cartilage  and remove waste. This helps keep the muscles around the joint strong. HOME CARE INSTRUCTIONS   Do not take aspirin to relieve pain if gout is suspected. This elevates uric acid levels.   Only take over-the-counter or prescription medicines for pain, discomfort or fever as directed by your caregiver.   Rest the joint as much as possible.   If your joint is swollen, keep it elevated.   Use crutches if the painful joint is in your leg.   Drinking plenty of fluids may help for certain types of arthritis.   Follow your caregiver's dietary instructions.   Try low-impact exercise such as:   Swimming.   Water aerobics.   Biking.   Walking.   Morning stiffness is often relieved by a warm shower.   Put your joints through regular range-of-motion.  SEEK MEDICAL CARE IF:   You do not feel better in 24 hours or are getting worse.   You have side effects to medications, or are not getting better with treatment.  SEEK IMMEDIATE MEDICAL CARE IF:   You have a fever.   You develop severe joint pain, swelling or redness.   Many joints are involved and become painful and swollen.   There is severe back pain and/or leg weakness.   You have loss of bowel or bladder control.  Document Released: 01/15/2005 Document Revised: 11/27/2011 Document Reviewed: 01/31/2009 [facility] Patient Information 2012 [place], [place].

## 2012-03-27 NOTE — ED Provider Notes (Signed)
Medical screening examination/treatment/procedure(s) were performed by non-physician practitioner and as supervising physician I was immediately available for consultation/collaboration.   Shellia Hartl Y. Shayann Garbutt, MD 03/27/12 1034 

## 2012-04-13 ENCOUNTER — Telehealth: Payer: Self-pay | Admitting: Family Medicine

## 2012-04-13 MED ORDER — ENALAPRIL MALEATE 10 MG PO TABS: 10.0000 mg | ORAL_TABLET | Freq: Every day | ORAL | Status: DC

## 2012-04-13 NOTE — Telephone Encounter (Signed)
Addended by: Lars Mage on: 04/13/2012 10:55 AM   Modules accepted: Orders

## 2012-04-13 NOTE — Telephone Encounter (Signed)
Sent!

## 2012-04-13 NOTE — Telephone Encounter (Signed)
Pt is needing refill on Enalapril 10 mg and they use [facility] [address].

## 2012-05-31 ENCOUNTER — Telehealth: Payer: Self-pay | Admitting: Family Medicine

## 2012-05-31 ENCOUNTER — Encounter: Payer: Self-pay | Admitting: Family Medicine

## 2012-05-31 ENCOUNTER — Ambulatory Visit (INDEPENDENT_AMBULATORY_CARE_PROVIDER_SITE_OTHER): Payer: Medicare Other | Admitting: Family Medicine

## 2012-05-31 VITALS — BP 126/84 | HR 84 | Temp 98.3°F | Wt 188.8 lb

## 2012-05-31 DIAGNOSIS — F17200 Nicotine dependence, unspecified, uncomplicated: Secondary | ICD-10-CM

## 2012-05-31 DIAGNOSIS — R071 Chest pain on breathing: Secondary | ICD-10-CM

## 2012-05-31 DIAGNOSIS — R079 Chest pain, unspecified: Secondary | ICD-10-CM

## 2012-05-31 DIAGNOSIS — F329 Major depressive disorder, single episode, unspecified: Secondary | ICD-10-CM

## 2012-05-31 DIAGNOSIS — R0789 Other chest pain: Secondary | ICD-10-CM | POA: Insufficient documentation

## 2012-05-31 NOTE — Telephone Encounter (Signed)
Nathan Fernandez with CAN on and off for 2 weeks left side chest pain, radiates down left side with tightness intermittently. Pt feels tired. Hx of heart attack and balloon procedure 1992. No SOB, neck or arm pain,N&V, or sweating. No pain now. Pt already has appt with Dr Para March today at 2:30. Keep appt unless symptoms return; then go to ER for eval. Nathan Fernandez will advise pt.

## 2012-05-31 NOTE — Telephone Encounter (Signed)
Caller: Joya Gaskins; PCP: Crawford Givens Clelia Croft); CB#: [phone]; ; ; Call regarding Patient Has Appt Today At 2:30p, Wife Calling B/C Patient Been Having Discomfort On the Left Side of Chest. Found Out Going On for About 2wks. Office Wanted Her To Speak With the Nurse First. THE PATIENT REFUSED 911;  Pts spouse was transferred to the nurse to see if the pt needs an appt today sooner than 2:30 today. Pt has been c/o left sided chest pain ongoing x 2 weeks. Pt describes it as a dull, aching pain with a tightness down his left side that is intermittent. He denies any N/V; SOB. He has been unusually tired the past 3 weeks. He denies the pain this am. Pt did not mention it to his wife until Friday - pt had been working in the yard Friday 05/21/12 and then he told his wife at this time who monitored it this w/e and called for an appt this am. RN triaged per chest pain and called the back line to speak with Dr. Para March  nurse per policy since sx calls for an earlier appt today/needs MD interaction before okaying appt time. RN spoke with Reana/LPN /she states  Dr. Para March advised that if the pt is asymptomatic we can maintain his office appt today. If the pt becomes symptomatic/he needs to go to the ED. RN relayed message to pts wife who voiced understanding.

## 2012-05-31 NOTE — Assessment & Plan Note (Signed)
He'll consider nicotine replacement.

## 2012-05-31 NOTE — Assessment & Plan Note (Signed)
>  25 min spent with face to face with patient, >50% counseling and/or coordinating care.  Slightly more irritable with social trigger.  No Si/Hi.  I would hold off on meds at this point.  He'll notify me if sx worsen.

## 2012-05-31 NOTE — Assessment & Plan Note (Signed)
reproduceable w/o acute changes on EKG.  Exam and history are reassuring.  Okay for outpatient f/u.  Should gradually resolve.

## 2012-05-31 NOTE — Patient Instructions (Addendum)
The chest wall pain should get better.  I would take tylenol as needed for pain and I would consider using the nicotine lozenges for smoking.  I would get a lab visit for later this summer with a visit with me after that. Take care.

## 2012-05-31 NOTE — Progress Notes (Signed)
2-3 weeks of chest pain. Positional.  Episodic.  L sided chest.  Was been working on Location [employer].  It isn't a severe pain.  He does have h/o CAD.  This isn't similar to prev cardiac chest pain.  No rash.  Not sob.  No BLE edema.  No NTG use.    His energy level is decreased over the last month.  He'll normally have "5 good days a week" but not recently.  Sleeping is 'so-so' but at baseline. No FCNAVD.  No skin rash.  He's been more irritable and less patient recently.  He has a lot of family stress recently and this is likely related.    He's thinking about stopping smoking and would consider the lozenges.  We discussed.    PMH and SH reviewed  ROS: See HPI, otherwise noncontributory.  Meds, vitals, and allergies reviewed.   nad ncat Mmm rrr Coarse but even BS L chest wall ttp just anterior to axilla w/o rash.   abd soft, not ttp Ext w/o edema

## 2013-01-12 ENCOUNTER — Other Ambulatory Visit: Payer: Self-pay | Admitting: Family Medicine

## 2013-01-23 ENCOUNTER — Other Ambulatory Visit: Payer: Self-pay | Admitting: Family Medicine

## 2013-01-24 NOTE — Telephone Encounter (Signed)
Electronic refill request.  Please advise. 

## 2013-01-24 NOTE — Telephone Encounter (Signed)
Sent!

## 2013-02-18 ENCOUNTER — Ambulatory Visit: Payer: Medicare Other | Admitting: Family Medicine

## 2013-02-21 ENCOUNTER — Encounter: Payer: Self-pay | Admitting: Family Medicine

## 2013-02-21 ENCOUNTER — Ambulatory Visit (INDEPENDENT_AMBULATORY_CARE_PROVIDER_SITE_OTHER): Payer: Medicare Other | Admitting: Family Medicine

## 2013-02-21 VITALS — BP 120/80 | HR 68 | Temp 98.0°F | Wt 193.0 lb

## 2013-02-21 DIAGNOSIS — R0789 Other chest pain: Secondary | ICD-10-CM

## 2013-02-21 DIAGNOSIS — R079 Chest pain, unspecified: Secondary | ICD-10-CM

## 2013-02-21 DIAGNOSIS — H5711 Ocular pain, right eye: Secondary | ICD-10-CM

## 2013-02-21 DIAGNOSIS — R071 Chest pain on breathing: Secondary | ICD-10-CM

## 2013-02-21 DIAGNOSIS — E119 Type 2 diabetes mellitus without complications: Secondary | ICD-10-CM

## 2013-02-21 DIAGNOSIS — R5381 Other malaise: Secondary | ICD-10-CM

## 2013-02-21 DIAGNOSIS — R5383 Other fatigue: Secondary | ICD-10-CM

## 2013-02-21 MED ORDER — CYCLOBENZAPRINE HCL 5 MG PO TABS: 10.0000 mg | ORAL_TABLET | Freq: Three times a day (TID) | ORAL | Status: DC | PRN

## 2013-02-21 NOTE — Progress Notes (Signed)
Chest pain.  No radiation.  It was on the L side of the chest.  Went on for about 7 days, gone for 2 weeks.  It was a sharp pain.  It was intermittently present.  Sometimes worse in the AM.  Not exertional, not worse with exertion.  Sometimes better with exertion.  No clear trigger.  "I thought I laid wrong on it." No R sided pain.  H/o CAD, MI 20+ years ago.  He's trying to cut out tobacco.  Discussed.    He continues to have lower back pain.  He hasn't been stretching.  He had talked with Dr. Hetty Ely about this years ago.  He's still playing golf once a week but "then I pay for it."  Taking tylenol with minimal relief, "but it's better than nothing."   He's had some R lateral eye pain.  Not present for the last week. It was lateral to the eye and would radiate posteriorly.  Irregular onset.  No vision change.  No L sided sx.    Sugar was ~118 on home checks. He's had some fatigue.  H/o DM2, diet controlled. Due for labs.    PMH and SH reviewed  ROS: See HPI, otherwise noncontributory.  Meds, vitals, and allergies reviewed.   GEN: nad, alert and oriented HEENT: mucous membranes moist NECK: supple w/o LA CV: rrr. PULM: ctab, no inc wob ABD: soft, +bs EXT: no edema SKIN: no acute rash PERRL, EOMI, lids wnl x4, orbits wnl x2, fundus wnl x2, conjunctiva wnl x2  EKG unchanged from prev

## 2013-02-21 NOTE — Patient Instructions (Addendum)
Schedule fasting labs soon with a 30 minute visit a few days after the labs.   Don't change your meds for now.   Take care.

## 2013-02-22 DIAGNOSIS — H5711 Ocular pain, right eye: Secondary | ICD-10-CM | POA: Insufficient documentation

## 2013-02-22 NOTE — Assessment & Plan Note (Signed)
Return for labs and then f/u to discuss.

## 2013-02-22 NOTE — Assessment & Plan Note (Signed)
Would check basic labs and then have pt f/u to discuss.

## 2013-02-22 NOTE — Assessment & Plan Note (Signed)
Now resolved, unremarkable exam w/o vision changes.  Would follow clinically. He agrees.

## 2013-02-22 NOTE — Assessment & Plan Note (Signed)
Likely resolved chest wall strain, unremarkable exam, no sx now.  Would follow clinically.

## 2013-02-24 ENCOUNTER — Other Ambulatory Visit (INDEPENDENT_AMBULATORY_CARE_PROVIDER_SITE_OTHER): Payer: Medicare Other

## 2013-02-24 ENCOUNTER — Telehealth: Payer: Self-pay | Admitting: *Deleted

## 2013-02-24 DIAGNOSIS — R5381 Other malaise: Secondary | ICD-10-CM

## 2013-02-24 DIAGNOSIS — E119 Type 2 diabetes mellitus without complications: Secondary | ICD-10-CM

## 2013-02-24 LAB — CBC WITH DIFFERENTIAL/PLATELET
Basophils Absolute: 0 10*3/uL (ref 0.0–0.1)
Eosinophils Absolute: 0.4 10*3/uL (ref 0.0–0.7)
HCT: 53.5 % — ABNORMAL HIGH (ref 39.0–52.0)
Hemoglobin: 18.2 g/dL (ref 13.0–17.0)
Lymphs Abs: 2.4 10*3/uL (ref 0.7–4.0)
MCHC: 34 g/dL (ref 30.0–36.0)
Neutro Abs: 3.6 10*3/uL (ref 1.4–7.7)
Platelets: 173 10*3/uL (ref 150.0–400.0)
RDW: 13.6 % (ref 11.5–14.6)

## 2013-02-24 LAB — LIPID PANEL
HDL: 29.6 mg/dL — ABNORMAL LOW (ref >39.00)
Total CHOL/HDL Ratio: 6
Triglycerides: 260 mg/dL — ABNORMAL HIGH (ref 0.0–149.0)
VLDL: 52 mg/dL — ABNORMAL HIGH (ref 0.0–40.0)

## 2013-02-24 LAB — COMPREHENSIVE METABOLIC PANEL
ALT: 28 U/L (ref 0–53)
AST: 23 U/L (ref 0–37)
Alkaline Phosphatase: 45 U/L (ref 39–117)
Creatinine, Ser: 1 mg/dL (ref 0.4–1.5)
Total Bilirubin: 0.6 mg/dL (ref 0.3–1.2)

## 2013-02-24 LAB — TSH: TSH: 1.9 u[IU]/mL (ref 0.35–5.50)

## 2013-02-24 NOTE — Telephone Encounter (Signed)
Critical Lab  Hgb 18.2  

## 2013-02-24 NOTE — Telephone Encounter (Signed)
See lab report note.

## 2013-03-03 ENCOUNTER — Ambulatory Visit (INDEPENDENT_AMBULATORY_CARE_PROVIDER_SITE_OTHER): Payer: Medicare Other | Admitting: Family Medicine

## 2013-03-03 ENCOUNTER — Encounter: Payer: Self-pay | Admitting: Family Medicine

## 2013-03-03 VITALS — BP 106/70 | HR 91 | Temp 98.0°F | Wt 193.0 lb

## 2013-03-03 DIAGNOSIS — D45 Polycythemia vera: Secondary | ICD-10-CM

## 2013-03-03 DIAGNOSIS — E7800 Pure hypercholesterolemia, unspecified: Secondary | ICD-10-CM

## 2013-03-03 DIAGNOSIS — I10 Essential (primary) hypertension: Secondary | ICD-10-CM

## 2013-03-03 DIAGNOSIS — F17200 Nicotine dependence, unspecified, uncomplicated: Secondary | ICD-10-CM

## 2013-03-03 DIAGNOSIS — I2510 Atherosclerotic heart disease of native coronary artery without angina pectoris: Secondary | ICD-10-CM | POA: Insufficient documentation

## 2013-03-03 DIAGNOSIS — E119 Type 2 diabetes mellitus without complications: Secondary | ICD-10-CM

## 2013-03-03 NOTE — Assessment & Plan Note (Signed)
Continue as is for now, will likely not get TG to goal with his history.  Labs d/w pt.

## 2013-03-03 NOTE — Patient Instructions (Signed)
See Shirlee Limerick about your referral before you leave today. Don't change your meds for now.  Stop smoking.  Recheck labs in 4 months before a visit with Para March.

## 2013-03-03 NOTE — Assessment & Plan Note (Signed)
Encouraged cessation.

## 2013-03-03 NOTE — Progress Notes (Signed)
Diabetes:  No meds Hypoglycemic episodes:no Hyperglycemic episodes:no Feet problems: see exam below A1c controlled.   Hypertension:    Using medication without problems or lightheadedness: yes Chest pain with exertion:no, none since before last OV Edema:no Short of breath:no Labs d/w pt.   Elevated Cholesterol: Using medications without problems:yes Muscle aches: no Diet compliance:yes Exercise: some Elevated TG d/w pt.   H/o partial thyroidectomy.  No ant neck pain.  TSH wnl, d/w pt.    Known h/o CAD.  Recently with less energy, not an acute change.  Also with complaint of cold feet and exertional thigh aches, bilaterally.  Smoker.    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  No known FH of similar.  No h/o clotting disorder.  D/w pt.   PMH and SH reviewed.   Vital signs, Meds and allergies reviewed.  ROS: See HPI.  Otherwise nontributory.   GEN: nad, alert and oriented HEENT: mucous membranes moist NECK: supple w/o LA CV: rrr. PULM: ctab, no inc wob ABD: soft, +bs EXT: no edema SKIN: no acute rash  Diabetic foot exam: Normal inspection No skin breakdown No calluses  Decreased DP pulse on L compared to R Normal sensation to light tough and monofilament Nails normal

## 2013-03-03 NOTE — Assessment & Plan Note (Signed)
Controlled, continue current meds.   

## 2013-03-03 NOTE — Assessment & Plan Note (Signed)
With concern for progression of PAD.  Will arrange for cards eval.  D/w pt about smoking and control of DM2/HTN/chol.  He understood.  I am concerned this is causing his lack of energy.  CP free.  On ASA.

## 2013-03-03 NOTE — Assessment & Plan Note (Signed)
Controlled by A1c, d/w pt about diet and labs. No change in meds.

## 2013-03-25 ENCOUNTER — Encounter: Payer: Self-pay | Admitting: Cardiovascular Disease

## 2013-03-25 ENCOUNTER — Ambulatory Visit (INDEPENDENT_AMBULATORY_CARE_PROVIDER_SITE_OTHER): Payer: Medicare Other | Admitting: Cardiovascular Disease

## 2013-03-25 VITALS — BP 118/80 | HR 86 | Ht 70.0 in | Wt 188.0 lb

## 2013-03-25 DIAGNOSIS — E119 Type 2 diabetes mellitus without complications: Secondary | ICD-10-CM

## 2013-03-25 DIAGNOSIS — J449 Chronic obstructive pulmonary disease, unspecified: Secondary | ICD-10-CM

## 2013-03-25 DIAGNOSIS — E7800 Pure hypercholesterolemia, unspecified: Secondary | ICD-10-CM

## 2013-03-25 DIAGNOSIS — R079 Chest pain, unspecified: Secondary | ICD-10-CM

## 2013-03-25 DIAGNOSIS — I2510 Atherosclerotic heart disease of native coronary artery without angina pectoris: Secondary | ICD-10-CM

## 2013-03-25 DIAGNOSIS — I10 Essential (primary) hypertension: Secondary | ICD-10-CM

## 2013-03-25 DIAGNOSIS — F17200 Nicotine dependence, unspecified, uncomplicated: Secondary | ICD-10-CM

## 2013-03-25 NOTE — Progress Notes (Signed)
Patient ID: Nathan Fernandez, male   DOB: 08-16-1941, 72 y.o.   MRN: [id number] 72 yo referred for CRF;s CAD and ? PVD.  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  No hospitalization since then Has occassional SSCP that is atypical Sharp mid chest not always exertional.  Also some exertional dyspnea. Still smoking and has clinical emphysema.  Has not had recent CXR.  Compliant with meds. Feet get cold No frank claudication  Borderline DM that he has tried to control with diet.    ROS: Denies fever, malais, weight loss, blurry vision, decreased visual acuity, cough, sputum, SOB, hemoptysis, pleuritic pain, palpitaitons, heartburn, abdominal pain, melena, lower extremity edema, claudication, or rash.  All other systems reviewed and negative   General: Affect appropriate Healthy:  appears stated age HEENT: normal Neck supple with no adenopathy JVP normal no bruits no thyromegaly Lungs clear with no wheezing and good diaphragmatic motion Heart:  S1/S2 no murmur,rub, gallop or click PMI normal Abdomen: benighn, BS positve, no tenderness, no AAA no bruit.  No HSM or HJR Distal pulses intact with no bruits Doppler with triphasic femorals and PT;s  No edema Neuro non-focal Skin warm and dry No muscular weakness  Medications Current Outpatient Prescriptions  Medication Sig Dispense Refill  . aspirin 81 MG EC tablet Take 81 mg by mouth daily.        . cyclobenzaprine (FLEXERIL) 5 MG tablet Take 2 tablets (10 mg total) by mouth 3 (three) times daily as needed.  90 tablet  3  . enalapril (VASOTEC) 10 MG tablet Take 1 tablet (10 mg total) by mouth daily.  90 tablet  3  . gemfibrozil (LOPID) 600 MG tablet TAKE 1 TABLET BY MOUTH TWICE DAILY.  180 tablet  0  . Ginkgo Biloba (GINKOBA) 40 MG TABS Take by mouth daily.        . Ginseng 250 MG CAPS Take by mouth daily.        Marland Kitchen KRILL OIL 1000 MG CAPS Take 1 capsule by mouth daily.      . Lancets (ONETOUCH ULTRASOFT) lancets 1 each by  Other route daily. Use as instructed       . Multiple Vitamin (MULTIVITAMIN) tablet Take 1 tablet by mouth daily.        . nitroGLYCERIN (NITROSTAT) 0.4 MG SL tablet Place one under tongue at onset of chest pain, may repeat in 5 minutes  25 tablet  12  . NON FORMULARY Green Tree Extract Take 2 tablets by mouth twice daily.      . Omega-3 Fatty Acids (FISH OIL) 1000 MG CAPS Take by mouth daily.        Marland Kitchen Specialty Vitamins Products (PROSTATE PO) Take one tab twice daily      . traZODone (DESYREL) 100 MG tablet TAKE 1 TABLET BY MOUTH AT BEDTIME.  90 tablet  1  . vitamin E 400 UNIT capsule Take 400 Units by mouth daily.        . Vitamins-Lipotropics (B-100 COMPLEX PO) Take by mouth daily.         No current facility-administered medications for this visit.    Allergies Penicillins  Family History: Family History  Problem Relation Age of Onset  . Hypertension Mother   . Aneurysm Mother   . Hypertension Father   . Stroke Father   . Heart disease Father     CAD  . Heart disease Son     MI  Social History: History   Social History  . Marital Status: Married    Spouse Name: N/A    Number of Children: 4  . Years of Education: N/A   Occupational History  . 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 Smoking status: Current Some Day Smoker    Types: Cigars  . Smokeless tobacco: Never Used     Comment: cigar occassionally  . Alcohol Use: No  . Drug Use: No  . Sexually Active: Not on file   Other Topics Concern  . Not on file   Social History Narrative   Divorced, lives with partner Steward Drone)   3 living children, had a daughter who died at age 88 in 11/22/2023   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          Electrocardiogram:  SR RBBB flat ST segments inferioroly 02/21/13  Assessment and Plan

## 2013-03-25 NOTE — Assessment & Plan Note (Signed)
Cholesterol is at goal.  Continue current dose of statin and diet Rx.  No myalgias or side effects.  F/U  LFT's in 6 months. Lab Results  Component Value Date   LDLCALC  Value: 41        Total Cholesterol/HDL:CHD Risk Coronary Heart Disease Risk Table                     Men   Women  1/2 Average Risk   3.4   3.3  Average Risk       5.0   4.4  2 X Average Risk   9.6   7.1  3 X Average Risk  23.4   11.0        Use the calculated Patient Ratio above and the CHD Risk Table to determine the patient's CHD Risk.        ATP III CLASSIFICATION (LDL):  <100     mg/dL   Optimal  401-027  mg/dL   Near or Above                    Optimal  130-159  mg/dL   Borderline  253-664  mg/dL   High  >403     mg/dL   Very High 4/74/2595

## 2013-03-25 NOTE — Assessment & Plan Note (Signed)
Clinically present Urged him to f/u with Dr Para March and consider PFT;s , inhaler Will get CXR since he has not had one

## 2013-03-25 NOTE — Assessment & Plan Note (Signed)
Discussed low carb diet.  Target hemoglobin A1c is 6.5 or less.  Continue current medications.  

## 2013-03-25 NOTE — Patient Instructions (Addendum)
Your physician wants you to follow-up in: YEAR WITH DR Haywood Filler will receive a reminder letter in the mail two months in advance. If you don't receive a letter, please call our office to schedule the follow-up appointment. Your physician recommends that you continue on your current medications as directed. Please refer to the Current Medication list given to you today. Your physician has requested that you have en exercise stress myoview. For further information please visit [email or web address] Please follow instruction sheet, as given. A chest x-ray takes a picture of the organs and structures inside the chest, including the heart, lungs, and blood vessels. This test can show several things, including, whether the heart is enlarges; whether fluid is building up in the lungs; and whether pacemaker / defibrillator leads are still in place.

## 2013-03-25 NOTE — Assessment & Plan Note (Signed)
Significant ongoing CRF;s especially smoking with atypical pain and dyspnea. F/U stress myovue ECG with RBBB and flat ST segments inferiorly

## 2013-03-25 NOTE — Assessment & Plan Note (Signed)
Well controlled.  Continue current medications and low sodium Dash type diet.    

## 2013-03-29 ENCOUNTER — Ambulatory Visit (INDEPENDENT_AMBULATORY_CARE_PROVIDER_SITE_OTHER)
Admission: RE | Admit: 2013-03-29 | Discharge: 2013-03-29 | Disposition: A | Discharge status: Home / Self Care | Payer: Medicare Other | Source: Ambulatory Visit | Attending: Cardiovascular Disease | Admitting: Cardiovascular Disease

## 2013-03-29 DIAGNOSIS — F17200 Nicotine dependence, unspecified, uncomplicated: Secondary | ICD-10-CM

## 2013-03-31 ENCOUNTER — Telehealth: Payer: Self-pay | Admitting: Family Medicine

## 2013-03-31 DIAGNOSIS — R9389 Abnormal findings on diagnostic imaging of other specified body structures: Secondary | ICD-10-CM

## 2013-03-31 NOTE — Telephone Encounter (Signed)
Patient advised.  Note sent to [facility] as requested by [facility].

## 2013-03-31 NOTE — Telephone Encounter (Signed)
Call pt.  He has an abnormal CXR.  Dr. Eden Emms contacted me about this.  He needs a f/u CT chest.  I put in the orders.  Please send this to [facility] after you talk to the patient.  Thanks.

## 2013-04-04 ENCOUNTER — Ambulatory Visit (INDEPENDENT_AMBULATORY_CARE_PROVIDER_SITE_OTHER)
Admission: RE | Admit: 2013-04-04 | Discharge: 2013-04-04 | Disposition: A | Discharge status: Home / Self Care | Payer: Medicare Other | Source: Ambulatory Visit | Attending: Family Medicine | Admitting: Family Medicine

## 2013-04-04 DIAGNOSIS — R918 Other nonspecific abnormal finding of lung field: Secondary | ICD-10-CM

## 2013-04-04 DIAGNOSIS — R9389 Abnormal findings on diagnostic imaging of other specified body structures: Secondary | ICD-10-CM

## 2013-04-06 ENCOUNTER — Ambulatory Visit (HOSPITAL_COMMUNITY): Payer: Medicare Other | Attending: Cardiovascular Disease | Admitting: Radiology

## 2013-04-06 VITALS — BP 117/81 | Ht 70.0 in | Wt 184.0 lb

## 2013-04-06 DIAGNOSIS — I252 Old myocardial infarction: Secondary | ICD-10-CM | POA: Insufficient documentation

## 2013-04-06 DIAGNOSIS — R079 Chest pain, unspecified: Secondary | ICD-10-CM

## 2013-04-06 DIAGNOSIS — J4489 Other specified chronic obstructive pulmonary disease: Secondary | ICD-10-CM | POA: Insufficient documentation

## 2013-04-06 DIAGNOSIS — Z8249 Family history of ischemic heart disease and other diseases of the circulatory system: Secondary | ICD-10-CM | POA: Insufficient documentation

## 2013-04-06 DIAGNOSIS — R5381 Other malaise: Secondary | ICD-10-CM | POA: Insufficient documentation

## 2013-04-06 DIAGNOSIS — I10 Essential (primary) hypertension: Secondary | ICD-10-CM | POA: Insufficient documentation

## 2013-04-06 DIAGNOSIS — E785 Hyperlipidemia, unspecified: Secondary | ICD-10-CM | POA: Insufficient documentation

## 2013-04-06 DIAGNOSIS — F17200 Nicotine dependence, unspecified, uncomplicated: Secondary | ICD-10-CM | POA: Insufficient documentation

## 2013-04-06 DIAGNOSIS — R0609 Other forms of dyspnea: Secondary | ICD-10-CM | POA: Insufficient documentation

## 2013-04-06 DIAGNOSIS — J449 Chronic obstructive pulmonary disease, unspecified: Secondary | ICD-10-CM | POA: Insufficient documentation

## 2013-04-06 DIAGNOSIS — R0989 Other specified symptoms and signs involving the circulatory and respiratory systems: Secondary | ICD-10-CM | POA: Insufficient documentation

## 2013-04-06 DIAGNOSIS — I4510 Unspecified right bundle-branch block: Secondary | ICD-10-CM | POA: Insufficient documentation

## 2013-04-06 DIAGNOSIS — Z9861 Coronary angioplasty status: Secondary | ICD-10-CM | POA: Insufficient documentation

## 2013-04-06 MED ORDER — TECHNETIUM TC 99M SESTAMIBI GENERIC - CARDIOLITE
30.0000 | Freq: Once | INTRAVENOUS | Status: AC | PRN
  Administered 2013-04-06: 30 via INTRAVENOUS

## 2013-04-06 MED ORDER — TECHNETIUM TC 99M SESTAMIBI GENERIC - CARDIOLITE
8.7000 | Freq: Once | INTRAVENOUS | Status: AC | PRN
  Administered 2013-04-06: 9 via INTRAVENOUS

## 2013-04-06 NOTE — Progress Notes (Signed)
[facility] SITE 3 NUCLEAR MED [address] [place], [place] [address] [phone]    Cardiology Nuclear Med Study  Nathan Fernandez is a 72 y.o. male     MRN : [id number]     DOB: 1941/12/17  Procedure Date: 04/06/2013  Nuclear Med Background Indication for Stress Test:  Evaluation for Ischemia History:  COPD and 20 yrs ago MI/Angioplasty Emphysema Cardiac Risk Factors: Family History - CAD, Hypertension, Lipids, RBBB and Smoker  Symptoms:  Chest Pain, DOE and Fatigue   Nuclear Pre-Procedure Caffeine/Decaff Intake:  None > 12 hrs NPO After: 11:30pm   Lungs:  clear O2 Sat: 95% on room air. IV 0.9% NS with Angio Cath:  20g  IV Site: R Antecubital x 1, tolerated well IV Started by:  Nathan Hong, RN  Chest Size (in):  44 Cup Size: n/a  Height: 5\' 10"  (1.778 m)  Weight:  184 lb (83.462 kg)  BMI:  Body mass index is 26.4 kg/(m^2). Tech Comments:  Took medications this am    Nuclear Med Study 1 or 2 day study: 1 day  Stress Test Type:  Stress  Reading MD: Nathan Miss, MD  Order Authorizing Provider:  Charlton Haws, MD  Resting Radionuclide: Technetium 1m Sestamibi  Resting Radionuclide Dose: 8.7 mCi   Stress Radionuclide:  Technetium 3m Sestamibi  Stress Radionuclide Dose: 29.4 mCi           Stress Protocol Rest HR: 71 Stress HR: 137  Rest BP: 117/81 Stress BP: 168/100  Exercise Time (min): 5:00 METS: 7.0   Predicted Max HR: 148 bpm % Max HR: 92.57 bpm Rate Pressure Product: 41324   Dose of Adenosine (mg):  n/a Dose of Lexiscan: n/a mg  Dose of Atropine (mg): n/a Dose of Dobutamine: n/a mcg/kg/min (at max HR)  Stress Test Technologist: Nathan Fernandez, EMT-P  Nuclear Technologist:  Nathan Fernandez, CNMT     Rest Procedure:  Myocardial perfusion imaging was performed at rest 45 minutes following the intravenous administration of Technetium 36m Sestamibi. Rest ECG: NSR-RBBB  Stress Procedure:  The patient exercised on the treadmill utilizing the Bruce  Protocol for 5:00 minutes. The patient stopped due to sob and denied any chest pain.  Technetium 18m Sestamibi was injected at peak exercise and myocardial perfusion imaging was performed after a brief delay. Stress ECG: No significant change from baseline ECG  QPS Raw Data Images:  Normal; no motion artifact; normal heart/lung ratio. Stress Images:  There is a moderately severe , medium sized defect of the anterior septum and a small, severe defect of the apex.      Rest Images:  There is a mildly severe, medium sized defect of the anterior septum and a small, severe defect of the apex. Subtraction (SDS):  this is consistant with a previous anterior apical MI with peri-MI ischemia involving the anterior septal wall.   SDS = 8 Transient Ischemic Dilatation (Normal <1.22):  1.06 Lung/Heart Ratio (Normal <0.45):  0.34  Quantitative Gated Spect Images QGS EDV:  111 ml QGS ESV:  56 ml  Impression Exercise Capacity:  Fair exercise capacity. BP Response:  Normal blood pressure response. Clinical Symptoms:  No significant symptoms noted. ECG Impression:  No significant ST segment change suggestive of ischemia. Comparison with Prior Nuclear Study: No images to compare  Overall Impression:  Intermediate stress nuclear study.  He has evidence of a small previous apical MI with the presence of a small - medium sized are of  anteriorseptal ischemia.  His LV function is mildly decreased.  There is hypokinesis of the anterior septum  LV Ejection Fraction: 49%.  LV Wall Motion:     Nathan Fernandez., MD, [facility] 04/06/2013, 5:43 PM Office - [phone] Pager [phone]

## 2013-04-11 ENCOUNTER — Encounter: Payer: Self-pay | Admitting: *Deleted

## 2013-04-14 ENCOUNTER — Encounter: Payer: Self-pay | Admitting: *Deleted

## 2013-04-14 ENCOUNTER — Telehealth: Payer: Self-pay | Admitting: *Deleted

## 2013-04-14 DIAGNOSIS — R9439 Abnormal result of other cardiovascular function study: Secondary | ICD-10-CM

## 2013-04-14 DIAGNOSIS — Z01810 Encounter for preprocedural cardiovascular examination: Secondary | ICD-10-CM

## 2013-04-14 DIAGNOSIS — R0602 Shortness of breath: Secondary | ICD-10-CM

## 2013-04-14 DIAGNOSIS — R5383 Other fatigue: Secondary | ICD-10-CM

## 2013-04-14 NOTE — Telephone Encounter (Signed)
SPOKE WITH PT'S WIFE PT TO HAVE LHC ON   04-18-13 AT 12:00 WITH DR MCALHANY./CY

## 2013-04-15 ENCOUNTER — Ambulatory Visit (INDEPENDENT_AMBULATORY_CARE_PROVIDER_SITE_OTHER): Payer: Medicare Other | Admitting: Family Medicine

## 2013-04-15 ENCOUNTER — Encounter: Payer: Self-pay | Admitting: Family Medicine

## 2013-04-15 VITALS — BP 104/70 | HR 76 | Temp 97.5°F | Wt 189.0 lb

## 2013-04-15 DIAGNOSIS — I10 Essential (primary) hypertension: Secondary | ICD-10-CM

## 2013-04-15 DIAGNOSIS — R5383 Other fatigue: Secondary | ICD-10-CM

## 2013-04-15 DIAGNOSIS — R0602 Shortness of breath: Secondary | ICD-10-CM

## 2013-04-15 DIAGNOSIS — J449 Chronic obstructive pulmonary disease, unspecified: Secondary | ICD-10-CM

## 2013-04-15 DIAGNOSIS — Z01810 Encounter for preprocedural cardiovascular examination: Secondary | ICD-10-CM

## 2013-04-15 DIAGNOSIS — R9439 Abnormal result of other cardiovascular function study: Secondary | ICD-10-CM

## 2013-04-15 LAB — BASIC METABOLIC PANEL
BUN: 22 mg/dL (ref 6–23)
Calcium: 9.6 mg/dL (ref 8.4–10.5)
Creatinine, Ser: 1 mg/dL (ref 0.4–1.5)
GFR: 79.78 mL/min (ref >60.00)
Glucose, Bld: 132 mg/dL — ABNORMAL HIGH (ref 70–99)

## 2013-04-15 LAB — CBC WITH DIFFERENTIAL/PLATELET
Basophils Relative: 0.7 % (ref 0.0–3.0)
Eosinophils Relative: 7.7 % — ABNORMAL HIGH (ref 0.0–5.0)
HCT: 52 % (ref 39.0–52.0)
Lymphs Abs: 2.4 10*3/uL (ref 0.7–4.0)
MCV: 93 fl (ref 78.0–100.0)
Monocytes Absolute: 0.7 10*3/uL (ref 0.1–1.0)
RBC: 5.59 Mil/uL (ref 4.22–5.81)
WBC: 6.8 10*3/uL (ref 4.5–10.5)

## 2013-04-15 MED ORDER — ALBUTEROL SULFATE HFA 108 (90 BASE) MCG/ACT IN AERS: 1.0000 | INHALATION_SPRAY | Freq: Four times a day (QID) | RESPIRATORY_TRACT | Status: DC | PRN

## 2013-04-15 NOTE — Patient Instructions (Signed)
Use the inhaler if you get short winded or if you are wheezing/coughing.  If you need it more than a few times a week, then let me know.  Try to cut out all tobacco.  Take care.

## 2013-04-17 ENCOUNTER — Other Ambulatory Visit: Payer: Self-pay | Admitting: Cardiovascular Disease

## 2013-04-17 ENCOUNTER — Encounter: Payer: Self-pay | Admitting: Family Medicine

## 2013-04-17 DIAGNOSIS — I2510 Atherosclerotic heart disease of native coronary artery without angina pectoris: Secondary | ICD-10-CM

## 2013-04-17 NOTE — Progress Notes (Signed)
Pt appears upset because he thought he was potentially getting conflicting information from cards.  I am not sure that he truly got conflicting information.  It appears that he was called re: the stress test and now he has cath planned.  I'll defer to cards and I advised him to d/w cards clinic.    Smoking.  Still smoking but cutting back. Some SOB with exertion, ie "when I'm working hard," but this passes. Rare wheeze, usually with exercise. Has increased over the years but not acutely.  No CP.  Emphysematous changes noted on CT.  D/w pt about imaging.   Meds, vitals, and allergies reviewed.   ROS: See HPI.  Otherwise, noncontributory.  nad Grumpy but able to converse ncat Mmm rrr Coarse BS but no wheeze abd soft Ext w/o edema

## 2013-04-17 NOTE — Assessment & Plan Note (Addendum)
D/w pt that stopping smoking was more important than anything else. Would use SABA only prn in meantime. If needed/used frequently, we can proceed with PFTs and long acting tx. Would continue with prn SABA in meantime. I hope he stops smoking.  Counseling given.  He's working on cutting back. He is at the point where he likely wouldn't need replacement as he goes some days w/o smoking. His cough is improved in the interval.  >25 min spent with face to face with patient, >50% counseling and/or coordinating care.

## 2013-04-18 ENCOUNTER — Encounter (HOSPITAL_BASED_OUTPATIENT_CLINIC_OR_DEPARTMENT_OTHER)
Admission: RE | Disposition: A | Discharge status: Home / Self Care | Payer: Self-pay | Source: Ambulatory Visit | Attending: Cardiovascular Disease

## 2013-04-18 ENCOUNTER — Inpatient Hospital Stay (HOSPITAL_BASED_OUTPATIENT_CLINIC_OR_DEPARTMENT_OTHER)
Admission: RE | Admit: 2013-04-18 | Discharge: 2013-04-18 | Disposition: A | Discharge status: Home / Self Care | Payer: Medicare Other | Source: Ambulatory Visit | Attending: Cardiovascular Disease | Admitting: Cardiovascular Disease

## 2013-04-18 DIAGNOSIS — I2510 Atherosclerotic heart disease of native coronary artery without angina pectoris: Secondary | ICD-10-CM

## 2013-04-18 DIAGNOSIS — I2541 Coronary artery aneurysm: Secondary | ICD-10-CM | POA: Insufficient documentation

## 2013-04-18 DIAGNOSIS — R7309 Other abnormal glucose: Secondary | ICD-10-CM | POA: Insufficient documentation

## 2013-04-18 DIAGNOSIS — F17200 Nicotine dependence, unspecified, uncomplicated: Secondary | ICD-10-CM | POA: Insufficient documentation

## 2013-04-18 DIAGNOSIS — R0989 Other specified symptoms and signs involving the circulatory and respiratory systems: Secondary | ICD-10-CM | POA: Insufficient documentation

## 2013-04-18 DIAGNOSIS — J438 Other emphysema: Secondary | ICD-10-CM | POA: Insufficient documentation

## 2013-04-18 DIAGNOSIS — I252 Old myocardial infarction: Secondary | ICD-10-CM | POA: Insufficient documentation

## 2013-04-18 DIAGNOSIS — R072 Precordial pain: Secondary | ICD-10-CM | POA: Insufficient documentation

## 2013-04-18 DIAGNOSIS — I10 Essential (primary) hypertension: Secondary | ICD-10-CM | POA: Insufficient documentation

## 2013-04-18 DIAGNOSIS — Z9861 Coronary angioplasty status: Secondary | ICD-10-CM | POA: Insufficient documentation

## 2013-04-18 DIAGNOSIS — R0609 Other forms of dyspnea: Secondary | ICD-10-CM | POA: Insufficient documentation

## 2013-04-18 DIAGNOSIS — E785 Hyperlipidemia, unspecified: Secondary | ICD-10-CM | POA: Insufficient documentation

## 2013-04-18 SURGERY — JV LEFT HEART CATHETERIZATION WITH CORONARY ANGIOGRAM
Anesthesia: Moderate Sedation

## 2013-04-18 MED ORDER — SODIUM CHLORIDE 0.9 % IJ SOLN: 3.0000 mL | Freq: Two times a day (BID) | INTRAMUSCULAR | Status: DC

## 2013-04-18 MED ORDER — SODIUM CHLORIDE 0.9 % IJ SOLN: 3.0000 mL | INTRAMUSCULAR | Status: DC | PRN

## 2013-04-18 MED ORDER — ACETAMINOPHEN 325 MG PO TABS: 650.0000 mg | ORAL_TABLET | ORAL | Status: DC | PRN

## 2013-04-18 MED ORDER — ASPIRIN 81 MG PO CHEW
324.0000 mg | CHEWABLE_TABLET | ORAL | Status: AC
  Administered 2013-04-18: 324 mg via ORAL

## 2013-04-18 MED ORDER — SODIUM CHLORIDE 0.9 % IV SOLN
250.0000 mL | INTRAVENOUS | Status: DC | PRN
Start: 2013-04-18 — End: 2013-04-18

## 2013-04-18 MED ORDER — SODIUM CHLORIDE 0.9 % IV SOLN: INTRAVENOUS | Status: DC

## 2013-04-18 MED ORDER — ONDANSETRON HCL 4 MG/2ML IJ SOLN: 4.0000 mg | Freq: Four times a day (QID) | INTRAMUSCULAR | Status: DC | PRN

## 2013-04-18 NOTE — CV Procedure (Signed)
   Cardiac Catheterization Operative Report  Nathan Fernandez [id number] 4/28/201412:52 PM Crawford Givens, MD  Procedure Performed:  1. Left Heart Catheterization 2. Selective Coronary Angiography 3. Left ventricular angiogram  Operator: Verne Carrow, MD  Indication:  72 yo male with history of CAD (reported cath in 1992 with balloon angioplasty of ? 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 No chest pain. Stress myoview with small to moderate apical defect, apical scar.                                 Procedure Details: The risks, benefits, complications, treatment options, and expected outcomes were discussed with the patient. The patient and/or family concurred with the proposed plan, giving informed consent. The patient was brought to the outpatient cath lab after IV hydration was begun and oral premedication was given. The patient was further sedated with Versed and Fentanyl. The right groin was prepped and draped in the usual manner. Using the modified Seldinger access technique, a 4 French sheath was placed in the right femoral artery. Standard diagnostic catheters were used to perform selective coronary angiography. A pigtail catheter was used to perform a left ventricular angiogram.  There were no immediate complications. The patient was taken to the recovery area in stable condition.   Hemodynamic Findings: Central aortic pressure: 126/71 Left ventricular pressure: 123/11/17  Angiographic Findings:  Left main:  Diffuse plaque with 40% distal stenosis.   Left Anterior Descending Artery: Moderate caliber vessel that courses to the apex. 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 Just prior to the first aneurysm there appears to be a moderate plaque representing a 50% stenosis. Just distal to the aneurysmal segment in the proximal vessel is another area of 50% stenosis. Multiple views are taken and this area does  not appear to be flow limiting although there is complex disease involving the aneurysmal segment. A small caliber diagonal branch arises from the aneurysmal segment. This diagonal branch has proximal 70% stenosis (1.5 mm vessel). The mid LAD has another aneurysmal segment. The remainder of the mid LAD and distal LAD has mild plaque disease.   Circumflex Artery: Large caliber vessel with 20% ostial stenosis. The proximal vessel has diffuse 20% stenosis. The first obtuse marginal branch has serial 30-40% stenoses. This does not appear to be flow limiting.   Right Coronary Artery: Large dominant vessel with diffuse 40% stenosis proximal vessel, diffuse 30% stenosis mid vessel, 20% distal stenosis. The PDA is moderate in caliber and has mild plaque disease.   Left Ventricular Angiogram: LVEF=50%  Impression: 1. Moderate non-obstructive triple vessel CAD 2. Low normal LV systolic function.   Recommendations: He has moderate CAD. The proximal LAD has complex disease with several aneurysmal segments involving the diagonal branch. There is moderate plaque in the proximal LAD but this does not appear to be flow limiting. He has no exertional chest pain. Would recommend medical management for now. If he has onset of angina, would consider further imaging of complex disease in proximal LAD with IVUS.        Complications:  None. The patient tolerated the procedure well.

## 2013-04-18 NOTE — Interval H&P Note (Signed)
History and Physical Interval Note:  04/18/2013 11:47 AM  Nathan Fernandez  has presented today for cardiac cath with the diagnosis of dypsnea, abnormal stress myoview  The various methods of treatment have been discussed with the patient and family. After consideration of risks, benefits and other options for treatment, the patient has consented to  Procedure(s): JV LEFT HEART CATHETERIZATION WITH CORONARY ANGIOGRAM (N/A) as a surgical intervention .  The patient's history has been reviewed, patient examined, no change in status, stable for surgery.  I have reviewed the patient's chart and labs.  Questions were answered to the patient's satisfaction.     Eliska Hamil

## 2013-04-18 NOTE — OR Nursing (Signed)
Tegaderm dressing applied, site level 0, bedrest begins a 1300

## 2013-04-18 NOTE — OR Nursing (Signed)
Meal served 

## 2013-04-18 NOTE — H&P (View-Only) (Signed)
Patient ID: Nathan Fernandez, male   DOB: 10/07/1941, 71 y.o.   MRN: [id number] 71 yo referred for CRF;s CAD and ? PVD.  Distant MI 20 years agao At [facility] SSCP indicates having a balloon.  No hospitalization since then Has occassional SSCP that is atypical Sharp mid chest not always exertional.  Also some exertional dyspnea. Still smoking and has clinical emphysema.  Has not had recent CXR.  Compliant with meds. Feet get cold No frank claudication  Borderline DM that he has tried to control with diet.    ROS: Denies fever, malais, weight loss, blurry vision, decreased visual acuity, cough, sputum, SOB, hemoptysis, pleuritic pain, palpitaitons, heartburn, abdominal pain, melena, lower extremity edema, claudication, or rash.  All other systems reviewed and negative   General: Affect appropriate Healthy:  appears stated age HEENT: normal Neck supple with no adenopathy JVP normal no bruits no thyromegaly Lungs clear with no wheezing and good diaphragmatic motion Heart:  S1/S2 no murmur,rub, gallop or click PMI normal Abdomen: benighn, BS positve, no tenderness, no AAA no bruit.  No HSM or HJR Distal pulses intact with no bruits Doppler with triphasic femorals and PT;s  No edema Neuro non-focal Skin warm and dry No muscular weakness  Medications Current Outpatient Prescriptions  Medication Sig Dispense Refill  . aspirin 81 MG EC tablet Take 81 mg by mouth daily.        . cyclobenzaprine (FLEXERIL) 5 MG tablet Take 2 tablets (10 mg total) by mouth 3 (three) times daily as needed.  90 tablet  3  . enalapril (VASOTEC) 10 MG tablet Take 1 tablet (10 mg total) by mouth daily.  90 tablet  3  . gemfibrozil (LOPID) 600 MG tablet TAKE 1 TABLET BY MOUTH TWICE DAILY.  180 tablet  0  . Ginkgo Biloba (GINKOBA) 40 MG TABS Take by mouth daily.        . Ginseng 250 MG CAPS Take by mouth daily.        . KRILL OIL 1000 MG CAPS Take 1 capsule by mouth daily.      . Lancets (ONETOUCH ULTRASOFT) lancets 1 each by  Other route daily. Use as instructed       . Multiple Vitamin (MULTIVITAMIN) tablet Take 1 tablet by mouth daily.        . nitroGLYCERIN (NITROSTAT) 0.4 MG SL tablet Place one under tongue at onset of chest pain, may repeat in 5 minutes  25 tablet  12  . NON FORMULARY Green Tree Extract Take 2 tablets by mouth twice daily.      . Omega-3 Fatty Acids (FISH OIL) 1000 MG CAPS Take by mouth daily.        . Specialty Vitamins Products (PROSTATE PO) Take one tab twice daily      . traZODone (DESYREL) 100 MG tablet TAKE 1 TABLET BY MOUTH AT BEDTIME.  90 tablet  1  . vitamin E 400 UNIT capsule Take 400 Units by mouth daily.        . Vitamins-Lipotropics (B-100 COMPLEX PO) Take by mouth daily.         No current facility-administered medications for this visit.    Allergies Penicillins  Family History: Family History  Problem Relation Age of Onset  . Hypertension Mother   . Aneurysm Mother   . Hypertension Father   . Stroke Father   . Heart disease Father     CAD  . Heart disease Son     MI      Social History: History   Social History  . Marital Status: Married    Spouse Name: N/A    Number of Children: 4  . 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 Smoking status: Current Some Day Smoker    Types: Cigars  . Smokeless tobacco: Never Used     Comment: cigar occassionally  . Alcohol Use: No  . Drug Use: No  . Sexually Active: Not on file   Other Topics Concern  . Not on file   Social History Narrative   Divorced, lives with partner (Brenda)   3 living children, had a daughter who died at age 42 in 11/04   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          Electrocardiogram:  SR RBBB flat ST segments inferioroly 02/21/13  Assessment and Plan   

## 2013-04-18 NOTE — OR Nursing (Signed)
Dr McAlhany at bedside to discuss results and treatment plan with pt and family 

## 2013-04-25 ENCOUNTER — Other Ambulatory Visit: Payer: Self-pay | Admitting: Family Medicine

## 2013-05-02 ENCOUNTER — Ambulatory Visit: Payer: Medicare Other | Admitting: Physician Assistant

## 2013-05-11 ENCOUNTER — Ambulatory Visit: Payer: Medicare Other | Admitting: Physician Assistant

## 2013-06-02 ENCOUNTER — Ambulatory Visit (INDEPENDENT_AMBULATORY_CARE_PROVIDER_SITE_OTHER): Payer: Medicare Other | Admitting: Physician Assistant

## 2013-06-02 ENCOUNTER — Encounter: Payer: Self-pay | Admitting: Physician Assistant

## 2013-06-02 VITALS — BP 118/76 | HR 89 | Ht 70.0 in | Wt 186.0 lb

## 2013-06-02 DIAGNOSIS — Z720 Tobacco use: Secondary | ICD-10-CM

## 2013-06-02 DIAGNOSIS — F17200 Nicotine dependence, unspecified, uncomplicated: Secondary | ICD-10-CM

## 2013-06-02 DIAGNOSIS — J449 Chronic obstructive pulmonary disease, unspecified: Secondary | ICD-10-CM

## 2013-06-02 DIAGNOSIS — E785 Hyperlipidemia, unspecified: Secondary | ICD-10-CM

## 2013-06-02 DIAGNOSIS — I10 Essential (primary) hypertension: Secondary | ICD-10-CM

## 2013-06-02 DIAGNOSIS — I2510 Atherosclerotic heart disease of native coronary artery without angina pectoris: Secondary | ICD-10-CM

## 2013-06-02 NOTE — Progress Notes (Signed)
[address] [place], [place]  [address] Phone: [phone] Fax:  [phone]  Date:  06/02/2013   ID:  Nathan Fernandez, DOB [birth year], MRN [id number]  PCP:  Crawford Givens, MD  Cardiologist:  Dr. Charlton Haws     History of Present Illness: Nathan Fernandez is a 72 y.o. male who returns for f/u after recent cardiac cath.  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 He recently saw Dr. Eden Emms 4/14 and complained of some chest pain.  Myoview was arranged and demonstrated anteroseptal ischemia. Cardiac catheterization was arranged. LHC 04/18/13: Distal left main 40%, proximal LAD with several aneurysmal segments, proximal LAD 50% prior to and after aneurysmal segments, area does not appear to flow-limiting, proximal diagonal 70%, ostial circumflex 20%, proximal circumflex 20%, OM1 30-40%, proximal RCA 40%, mid RCA 30%, distal RCA 20%, EF 50%. Disease in the LAD did not appear to be flow-limiting. If he had recurrent chest discomfort suggestive of angina, consideration can be given toward further imaging of his proximal LAD with IVUS.    Patient is doing well.  The patient denies chest pain, shortness of breath, syncope, orthopnea, PND or significant pedal edema.   Labs (4/14):  K 4.8, Cr 1.0, Hgb 17.8  Wt Readings from Last 3 Encounters:  06/02/13 186 lb (84.369 kg)  04/18/13 189 lb (85.73 kg)  04/18/13 189 lb (85.73 kg)     Past Medical History  Diagnosis Date  . History of ETT 1998    wnl  . 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    Hosp R facial weakness  . History of MRI 08/04/09    brain- atrophy sm vess dz  . CAD (coronary artery disease)     MI, [facility] 1993, PTCA; LHC 04/18/13: Distal left main 40%, proximal LAD with several aneurysmal segments, proximal LAD 50% prior to and after aneurysmal segments, area does not appear to flow-limiting, proximal diagonal 70%, ostial circumflex 20%, proximal circumflex 20%, OM1 30-40%, proximal RCA 40%, mid RCA 30%,  distal RCA 20%, EF 50% => med Rx  . HTN (hypertension)   . HLD (hyperlipidemia)   . COPD (chronic obstructive pulmonary disease)   . DM2 (diabetes mellitus, type 2)     Current Outpatient Prescriptions  Medication Sig Dispense Refill  . albuterol (PROVENTIL HFA;VENTOLIN HFA) 108 (90 BASE) MCG/ACT inhaler Inhale 1-2 puffs into the lungs every 6 (six) hours as needed for wheezing.  1 Inhaler  2  . aspirin 81 MG EC tablet Take 81 mg by mouth daily.        . cyclobenzaprine (FLEXERIL) 5 MG tablet Take 2 tablets (10 mg total) by mouth 3 (three) times daily as needed.  90 tablet  3  . enalapril (VASOTEC) 10 MG tablet TAKE 1 TABLET BY MOUTH DAILY  90 tablet  1  . gemfibrozil (LOPID) 600 MG tablet TAKE 1 TABLET BY MOUTH TWICE DAILY.  180 tablet  1  . Ginkgo Biloba (GINKOBA) 40 MG TABS Take by mouth daily.        . Ginseng 250 MG CAPS Take by mouth daily.        Marland Kitchen KRILL OIL 1000 MG CAPS Take 1 capsule by mouth daily.      . Lancets (ONETOUCH ULTRASOFT) lancets 1 each by Other route daily. Use as instructed       . Multiple Vitamin (MULTIVITAMIN) tablet Take 1 tablet by mouth daily.        Marland Kitchen  nitroGLYCERIN (NITROSTAT) 0.4 MG SL tablet Place one under tongue at onset of chest pain, may repeat in 5 minutes  25 tablet  12  . NON FORMULARY Green Tree Extract Take 2 tablets by mouth twice daily.      . Omega-3 Fatty Acids (FISH OIL) 1000 MG CAPS Take by mouth daily.        Marland Kitchen Specialty Vitamins Products (PROSTATE PO) Take one tab twice daily      . traZODone (DESYREL) 100 MG tablet TAKE 1 TABLET BY MOUTH AT BEDTIME.  90 tablet  1  . vitamin E 400 UNIT capsule Take 400 Units by mouth daily.        . Vitamins-Lipotropics (B-100 COMPLEX PO) Take by mouth daily.         No current facility-administered medications for this visit.    Allergies:    Allergies  Allergen Reactions  . Penicillins     REACTION: RASH    Social History:  The patient  reports that he has been smoking Cigars.  He has never  used smokeless tobacco. He reports that he does not drink alcohol or use illicit drugs.   ROS:  Please see the history of present illness.      All other systems reviewed and negative.   PHYSICAL EXAM: VS:  BP 118/76  Pulse 89  Ht 5\' 10"  (1.778 m)  Wt 186 lb (84.369 kg)  BMI 26.69 kg/m2 Well nourished, well developed, in no acute distress HEENT: normal Neck: no JVD Cardiac:  normal S1, S2; RRR; no murmur Lungs:  clear to auscultation bilaterally, no wheezing, rhonchi or rales Abd: soft, nontender, no hepatomegaly Ext: no edema; right groin without hematoma or bruit  Skin: warm and dry Neuro:  CNs 2-12 intact, no focal abnormalities noted  EKG:  NSR, HR 89, normal axis, RBBB     ASSESSMENT AND PLAN:  1. CAD:  He denies any symptoms of significant angina.  Continue medical therapy area. He remains on aspirin, statin. 2. Hypertension: Controlled. 3. Hyperlipidemia: Managed by primary care. Continue statin. 4. Tobacco Abuse:  We discussed the importance of smoking cessation. 5. COPD: Recent chest x-ray with questionable mass. Follow up chest CT was okay without nodule. Moderate emphysema noted. 6. Disposition: Follow up with Dr. Eden Emms in one year.  Signed, Tereso Newcomer, PA-C  06/02/2013 3:02 PM

## 2013-06-02 NOTE — Patient Instructions (Addendum)
Your physician wants you to follow-up in: 1 YR WITH DR. Eden Emms. You will receive a reminder letter in the mail two months in advance. If you don't receive a letter, please call our office to schedule the follow-up appointment.  NO CHANGES WERE MADE TODAY

## 2013-06-30 ENCOUNTER — Other Ambulatory Visit: Payer: Self-pay

## 2013-08-20 ENCOUNTER — Other Ambulatory Visit: Payer: Self-pay | Admitting: Family Medicine

## 2013-08-23 NOTE — Telephone Encounter (Signed)
Received refill request electronically. Last office visit 04/15/13. Is it okay to refill medication?

## 2013-08-23 NOTE — Telephone Encounter (Signed)
Sent!

## 2013-10-16 ENCOUNTER — Other Ambulatory Visit: Payer: Self-pay | Admitting: Family Medicine

## 2013-10-17 NOTE — Telephone Encounter (Signed)
See warning with Pravastatin .Last lipid check was  02/24/13. Is it okay to refill medication.

## 2013-10-17 NOTE — Telephone Encounter (Signed)
Sent. Okay to continue.

## 2013-10-27 ENCOUNTER — Other Ambulatory Visit: Payer: Self-pay

## 2014-01-10 ENCOUNTER — Other Ambulatory Visit: Payer: Self-pay | Admitting: Family Medicine

## 2014-02-13 ENCOUNTER — Other Ambulatory Visit: Payer: Self-pay | Admitting: Family Medicine

## 2014-02-13 NOTE — Telephone Encounter (Signed)
Received refill request electronically. Last refill 08/20/13 #90/1 refill, last office visit 04/15/13. Is it okay to refill medication?

## 2014-02-13 NOTE — Telephone Encounter (Signed)
Sent, schedule a CPE.  Thanks.

## 2014-02-14 NOTE — Telephone Encounter (Signed)
Called patient on cell phone and advised him that he needs to schedule a CPE. Patient stated that he will call back and get this scheduled.

## 2014-04-09 ENCOUNTER — Other Ambulatory Visit: Payer: Self-pay | Admitting: Family Medicine

## 2014-04-09 DIAGNOSIS — D751 Secondary polycythemia: Secondary | ICD-10-CM

## 2014-04-09 DIAGNOSIS — E119 Type 2 diabetes mellitus without complications: Secondary | ICD-10-CM

## 2014-04-10 ENCOUNTER — Other Ambulatory Visit (INDEPENDENT_AMBULATORY_CARE_PROVIDER_SITE_OTHER): Payer: Medicare Other

## 2014-04-10 DIAGNOSIS — D45 Polycythemia vera: Secondary | ICD-10-CM

## 2014-04-10 DIAGNOSIS — E119 Type 2 diabetes mellitus without complications: Secondary | ICD-10-CM

## 2014-04-10 DIAGNOSIS — D751 Secondary polycythemia: Secondary | ICD-10-CM

## 2014-04-10 LAB — COMPREHENSIVE METABOLIC PANEL
ALBUMIN: 3.6 g/dL (ref 3.5–5.2)
ALK PHOS: 37 U/L — AB (ref 39–117)
ALT: 27 U/L (ref 0–53)
AST: 22 U/L (ref 0–37)
BILIRUBIN TOTAL: 0.9 mg/dL (ref 0.3–1.2)
BUN: 21 mg/dL (ref 6–23)
CO2: 27 meq/L (ref 19–32)
Calcium: 9.3 mg/dL (ref 8.4–10.5)
Chloride: 107 mEq/L (ref 96–112)
Creatinine, Ser: 0.8 mg/dL (ref 0.4–1.5)
GFR: 96.37 mL/min (ref >60.00)
GLUCOSE: 128 mg/dL — AB (ref 70–99)
POTASSIUM: 4.6 meq/L (ref 3.5–5.1)
SODIUM: 141 meq/L (ref 135–145)
TOTAL PROTEIN: 6.7 g/dL (ref 6.0–8.3)

## 2014-04-10 LAB — HEMOGLOBIN A1C: Hgb A1c MFr Bld: 6.4 % (ref 4.6–6.5)

## 2014-04-10 LAB — LIPID PANEL
CHOLESTEROL: 112 mg/dL (ref 0–200)
HDL: 29.8 mg/dL — ABNORMAL LOW (ref >39.00)
LDL Cholesterol: 41 mg/dL (ref 0–99)
TRIGLYCERIDES: 207 mg/dL — AB (ref 0.0–149.0)
Total CHOL/HDL Ratio: 4
VLDL: 41.4 mg/dL — ABNORMAL HIGH (ref 0.0–40.0)

## 2014-04-10 LAB — CBC WITH DIFFERENTIAL/PLATELET
BASOS ABS: 0 10*3/uL (ref 0.0–0.1)
Basophils Relative: 0.6 % (ref 0.0–3.0)
Eosinophils Absolute: 0.5 10*3/uL (ref 0.0–0.7)
Eosinophils Relative: 7.2 % — ABNORMAL HIGH (ref 0.0–5.0)
HEMATOCRIT: 48.6 % (ref 39.0–52.0)
Hemoglobin: 16.7 g/dL (ref 13.0–17.0)
LYMPHS ABS: 2.4 10*3/uL (ref 0.7–4.0)
Lymphocytes Relative: 36.2 % (ref 12.0–46.0)
MCHC: 34.4 g/dL (ref 30.0–36.0)
MCV: 92.8 fl (ref 78.0–100.0)
MONO ABS: 0.6 10*3/uL (ref 0.1–1.0)
Monocytes Relative: 9.3 % (ref 3.0–12.0)
NEUTROS PCT: 46.7 % (ref 43.0–77.0)
Neutro Abs: 3.1 10*3/uL (ref 1.4–7.7)
PLATELETS: 153 10*3/uL (ref 150.0–400.0)
RBC: 5.23 Mil/uL (ref 4.22–5.81)
RDW: 13.4 % (ref 11.5–14.6)
WBC: 6.6 10*3/uL (ref 4.5–10.5)

## 2014-04-17 ENCOUNTER — Ambulatory Visit (INDEPENDENT_AMBULATORY_CARE_PROVIDER_SITE_OTHER): Payer: Medicare Other | Admitting: Family Medicine

## 2014-04-17 ENCOUNTER — Telehealth: Payer: Self-pay | Admitting: Family Medicine

## 2014-04-17 ENCOUNTER — Encounter: Payer: Self-pay | Admitting: Family Medicine

## 2014-04-17 VITALS — BP 118/72 | HR 76 | Temp 98.0°F | Ht 66.75 in | Wt 193.5 lb

## 2014-04-17 DIAGNOSIS — R5383 Other fatigue: Secondary | ICD-10-CM

## 2014-04-17 DIAGNOSIS — R5381 Other malaise: Secondary | ICD-10-CM

## 2014-04-17 DIAGNOSIS — E119 Type 2 diabetes mellitus without complications: Secondary | ICD-10-CM

## 2014-04-17 DIAGNOSIS — E7800 Pure hypercholesterolemia, unspecified: Secondary | ICD-10-CM

## 2014-04-17 DIAGNOSIS — I10 Essential (primary) hypertension: Secondary | ICD-10-CM

## 2014-04-17 DIAGNOSIS — F17200 Nicotine dependence, unspecified, uncomplicated: Secondary | ICD-10-CM

## 2014-04-17 DIAGNOSIS — Z0000 Encounter for general adult medical examination without abnormal findings: Secondary | ICD-10-CM

## 2014-04-17 MED ORDER — TRAZODONE HCL 100 MG PO TABS: ORAL_TABLET | ORAL | Status: DC

## 2014-04-17 NOTE — Telephone Encounter (Signed)
Relevant patient education assigned to patient using Emmi. ° °

## 2014-04-17 NOTE — Progress Notes (Signed)
Pre visit review using our clinic review tool, if applicable. No additional management support is needed unless otherwise documented below in the visit note.  CPE- See plan.  Routine anticipatory guidance given to patient.  See health maintenance. Tetanus 2002 Shingles shot d/w pt.   PNA and flu shot d/w pt.   Colonoscopy due, he'll call [facility] about follow up.  Prostate cancer screening and PSA options (with potential risks and benefits of testing vs not testing) were discussed along with recent recs/guidelines.  He declined testing PSA at this point. Living will d/w pt.  [name] is his HCPOA.   Diet and exercise d/w pt.  He's working on both.    Fatigue continues.  He doesn't sleep well, hasn't slept well in years.  Usually waking up 2-3 times at night.  Naps in the AM. Taking trazodone before sleep w/o much help.  Minimal snoring.  No apneas noted.  He was working 70-80 hours a week earlier this year "and it took a lot out of me."    Diabetes:  No meds Hypoglycemic episodes:no Hyperglycemic episodes:no Feet problems:no Blood Sugars averaging:occ checked, ~110s  Hypertension:    Using medication without problems or lightheadedness: yes Chest pain with exertion:no Edema:no Short of breath:no  Elevated Cholesterol: Using medications without problems:yes Muscle aches: rare cramps.  Diet compliance: yes Exercise:some  PMH and SH reviewed  Meds, vitals, and allergies reviewed.   ROS: See HPI.  Otherwise negative.    GEN: nad, alert and oriented HEENT: mucous membranes moist NECK: supple w/o LA CV: rrr. PULM: ctab, no inc wob ABD: soft, +bs EXT: no edema SKIN: no acute rash  Diabetic foot exam: Normal inspection No skin breakdown No calluses  Normal DP pulses Normal sensation to light touch and monofilament Nails normal

## 2014-04-17 NOTE — Patient Instructions (Addendum)
Check with your insurance to see if they will cover the shingles and tetanus shot. Call about an eye exam.  Try taking 1.5 tabs of trazodone at night if you continue to have trouble sleeping.  Take care.  Glad to see you.

## 2014-04-18 ENCOUNTER — Other Ambulatory Visit: Payer: Self-pay | Admitting: Cardiovascular Disease

## 2014-04-18 DIAGNOSIS — Z0000 Encounter for general adult medical examination without abnormal findings: Secondary | ICD-10-CM | POA: Insufficient documentation

## 2014-04-18 NOTE — Telephone Encounter (Signed)
Dr Damita Dunnings, I  received this refill request on pravastatin but dont see on any MD notes, can you please refill if needed, Leta Speller note states you manage this. (it is on med profile just not listed in MD notes), Lovett Sox, RN patient care advocate      1. CAD: He denies any symptoms of significant angina. Continue medical therapy area. He remains on aspirin, statin. 2. Hypertension: Controlled. 3. Hyperlipidemia: Managed by primary care. Continue statin. 4. Tobacco Abuse: We discussed the importance of smoking cessation. 5. COPD: Recent chest x-ray with questionable mass. Follow up chest CT was okay without nodule. Moderate emphysema noted. 6. Disposition: Follow up with Dr. Johnsie Cancel in one year. Signed,  Richardson Dopp, PA-C  06/02/2013 3:02 PM

## 2014-04-18 NOTE — Assessment & Plan Note (Signed)
Controlled off meds. Labs d/w pt.  Continue work on diet and exercise.

## 2014-04-18 NOTE — Assessment & Plan Note (Signed)
Routine anticipatory guidance given to patient.  See health maintenance. Tetanus 2002 Shingles shot d/w pt.   PNA and flu shot d/w pt.   Colonoscopy due, he'll call [facility] about follow up.  Prostate cancer screening and PSA options (with potential risks and benefits of testing vs not testing) were discussed along with recent recs/guidelines.  He declined testing PSA at this point. Living will d/w pt.  [name] is his HCPOA.   Diet and exercise d/w pt.  He's working on both.

## 2014-04-18 NOTE — Assessment & Plan Note (Signed)
-

## 2014-04-18 NOTE — Telephone Encounter (Signed)
I don't see where we have ever filled this. Should it be sent to pcp? Please advise. Thanks, MI

## 2014-04-18 NOTE — Assessment & Plan Note (Signed)
Reasonable control.  Labs d/w pt.  Continue work on diet and exercise due to TG.

## 2014-04-18 NOTE — Assessment & Plan Note (Signed)
Labs d/w pt.  Continue work on diet and exercise. Controlled.

## 2014-04-18 NOTE — Assessment & Plan Note (Signed)
He doesn't sleep well, hasn't slept well in years. Usually waking up 2-3 times at night. Naps in the AM. Taking trazodone before sleep w/o much help. Minimal snoring. No apneas noted. He was working 70-80 hours a week earlier this year "and it took a lot out of me."  Likely a combination of baseline sleep troubles with recent overwork. Can inc trazodone if not better soon. He agrees.

## 2014-04-19 NOTE — Telephone Encounter (Signed)
Sent!

## 2014-04-23 ENCOUNTER — Other Ambulatory Visit: Payer: Self-pay | Admitting: Family Medicine

## 2014-05-04 ENCOUNTER — Telehealth: Payer: Self-pay | Admitting: Family Medicine

## 2014-05-04 ENCOUNTER — Encounter: Payer: Self-pay | Admitting: Internal Medicine

## 2014-05-04 ENCOUNTER — Ambulatory Visit (INDEPENDENT_AMBULATORY_CARE_PROVIDER_SITE_OTHER): Payer: Medicare Other | Admitting: Internal Medicine

## 2014-05-04 VITALS — BP 132/80 | HR 74 | Temp 98.5°F | Wt 193.0 lb

## 2014-05-04 DIAGNOSIS — M109 Gout, unspecified: Secondary | ICD-10-CM | POA: Insufficient documentation

## 2014-05-04 MED ORDER — COLCHICINE 0.6 MG PO TABS: 0.6000 mg | ORAL_TABLET | Freq: Two times a day (BID) | ORAL | Status: DC | PRN

## 2014-05-04 NOTE — Telephone Encounter (Signed)
Wife notified to hold pravastatin for 1-2 weeks and update [place] on pt's condition, wife verbalized understanding

## 2014-05-04 NOTE — Telephone Encounter (Signed)
Seeing Dr. Silvio Pate today.  Thanks.

## 2014-05-04 NOTE — Assessment & Plan Note (Signed)
Fairly classic location and has had past attack This is relatively mild Relative contraindications to NSAID--age, etc Will try colchicine

## 2014-05-04 NOTE — Progress Notes (Signed)
Subjective:    Patient ID: Nathan Fernandez, male    DOB: 23-Nov-1941, 73 y.o.   MRN: [id number]  HPI Here with wife  Having pain in right big toe Has had some pain along the right Achilles insertion point for 2-3 weeks or so Tried some soaks  Pain and some swelling along 1st MTP Did have gout in the past some years ago Seen in ER--got medicine for a few days and it helped (?indomethacin)  No fever Doesn't feel sick--but still slightly sluggish  Current Outpatient Prescriptions on File Prior to Visit  Medication Sig Dispense Refill  . aspirin 81 MG EC tablet Take 81 mg by mouth daily.        . cyclobenzaprine (FLEXERIL) 5 MG tablet Take 2 tablets (10 mg total) by mouth 3 (three) times daily as needed.  90 tablet  3  . enalapril (VASOTEC) 10 MG tablet TAKE 1 TABLET BY MOUTH DAILY  90 tablet  1  . gemfibrozil (LOPID) 600 MG tablet TAKE 1 TABLET BY MOUTH TWICE DAILY.  180 tablet  3  . Ginkgo Biloba (GINKOBA) 40 MG TABS Take by mouth daily.        . Ginseng 250 MG CAPS Take by mouth daily.        . Lancets (ONETOUCH ULTRASOFT) lancets 1 each by Other route daily. Use as instructed       . Multiple Vitamin (MULTIVITAMIN) tablet Take 1 tablet by mouth daily.        . nitroGLYCERIN (NITROSTAT) 0.4 MG SL tablet Place one under tongue at onset of chest pain, may repeat in 5 minutes  25 tablet  12  . NON FORMULARY Green Tea Extract Take 2 tablets by mouth twice daily.      . Omega-3 Fatty Acids (FISH OIL) 1000 MG CAPS Take by mouth daily.        . pravastatin (PRAVACHOL) 40 MG tablet TAKE 1 TABLET BY MOUTH AT BEDTIME  31 tablet  11  . Specialty Vitamins Products (PROSTATE PO) Take one tab twice daily      . traZODone (DESYREL) 100 MG tablet TAKE 1-1.5 TABLETS BY MOUTH AT BEDTIME.      . vitamin E 400 UNIT capsule Take 400 Units by mouth daily.        . Vitamins-Lipotropics (B-100 COMPLEX PO) Take by mouth daily.         No current facility-administered medications on file prior to visit.     Allergies  Allergen Reactions  . Penicillins     REACTION: RASH    Past Medical History  Diagnosis Date  . History of ETT 1998    wnl  . 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    Hosp R facial weakness  . History of MRI 08/04/09    brain- atrophy sm vess dz  . CAD (coronary artery disease)     MI, [place], PTCA; [place] 04/18/13: Distal left main 40%, proximal LAD with several aneurysmal segments, proximal LAD 50% prior to and after aneurysmal segments, area does not appear to flow-limiting, proximal diagonal 70%, ostial circumflex 20%, proximal circumflex 20%, OM1 30-40%, proximal RCA 40%, mid RCA 30%, distal RCA 20%, EF 50% => med Rx  . HTN (hypertension)   . HLD (hyperlipidemia)   . COPD (chronic obstructive pulmonary disease)   . DM2 (diabetes mellitus, type 2)     Past Surgical History  Procedure Laterality Date  . 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 Cataract  surgery  09/06    repair lens which moved  . Stress myoview  06/29/06    sm distal anteroseptal & apical infarct    Family History  Problem Relation Age of Onset  . Hypertension Mother   . Aneurysm Mother   . Hypertension Father   . Stroke Father   . Heart disease Father     CAD  . Heart disease Son     MI  . Colon cancer Neg Hx   . Prostate cancer Neg Hx     History   Social History  . Marital Status: Single    Spouse Name: N/A    Number of Children: 4  . Years of Education: N/A   Occupational History  . 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 Smoking status: Current Some Day Smoker    Types: Cigars  . Smokeless tobacco: Never Used     Comment: cigar occassionally  . Alcohol Use: No  . Drug Use: No  . Sexual Activity: Not on file   Other Topics Concern  . Not on file   Social History Narrative   Divorced, lives with partner [name])   3 living children, had a daughter who died at age 42 in 10-30-23   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    Review of Systems Appetite is fine No other swollen joints--but ongoing pain in right thumb since jamming it 6 months ago     Objective:   Physical Exam  Constitutional: He appears well-developed and well-nourished. No distress.  Musculoskeletal:  Right ankle is quiet No heel pain Localized pain in medial side of right 1st MTP           Assessment & Plan:

## 2014-05-04 NOTE — Patient Instructions (Addendum)
Please try the colchicine with 1 right away, and another within 1-2 hours. If your pain is better, use it twice a day for 2-3 days, then once a day for 2-3 days--then stop. You can then use it as needed if the gout comes back.  Gout Gout is an inflammatory arthritis caused by a buildup of uric acid crystals in the joints. Uric acid is a chemical that is normally present in the blood. When the level of uric acid in the blood is too high it can form crystals that deposit in your joints and tissues. This causes joint redness, soreness, and swelling (inflammation). Repeat attacks are common. Over time, uric acid crystals can form into masses (tophi) near a joint, destroying bone and causing disfigurement. Gout is treatable and often preventable. CAUSES  The disease begins with elevated levels of uric acid in the blood. Uric acid is produced by your body when it breaks down a naturally found substance called purines. Certain foods you eat, such as meats and fish, contain high amounts of purines. Causes of an elevated uric acid level include:  Being passed down from parent to child (heredity).  Diseases that cause increased uric acid production (such as obesity, psoriasis, and certain cancers).  Excessive alcohol use.  Diet, especially diets rich in meat and seafood.  Medicines, including certain cancer-fighting medicines (chemotherapy), water pills (diuretics), and aspirin.  Chronic kidney disease. The kidneys are no longer able to remove uric acid well.  Problems with metabolism. Conditions strongly associated with gout include:  Obesity.  High blood pressure.  High cholesterol.  Diabetes. Not everyone with elevated uric acid levels gets gout. It is not understood why some people get gout and others do not. Surgery, joint injury, and eating too much of certain foods are some of the factors that can lead to gout attacks. SYMPTOMS   An attack of gout comes on quickly. It causes intense  pain with redness, swelling, and warmth in a joint.  Fever can occur.  Often, only one joint is involved. Certain joints are more commonly involved:  Base of the big toe.  Knee.  Ankle.  Wrist.  Finger. Without treatment, an attack usually goes away in a few days to weeks. Between attacks, you usually will not have symptoms, which is different from many other forms of arthritis. DIAGNOSIS  Your caregiver will suspect gout based on your symptoms and exam. In some cases, tests may be recommended. The tests may include:  Blood tests.  Urine tests.  X-rays.  Joint fluid exam. This exam requires a needle to remove fluid from the joint (arthrocentesis). Using a microscope, gout is confirmed when uric acid crystals are seen in the joint fluid. TREATMENT  There are two phases to gout treatment: treating the sudden onset (acute) attack and preventing attacks (prophylaxis).  Treatment of an Acute Attack.  Medicines are used. These include anti-inflammatory medicines or steroid medicines.  An injection of steroid medicine into the affected joint is sometimes necessary.  The painful joint is rested. Movement can worsen the arthritis.  You may use warm or cold treatments on painful joints, depending which works best for you.  Treatment to Prevent Attacks.  If you suffer from frequent gout attacks, your caregiver may advise preventive medicine. These medicines are started after the acute attack subsides. These medicines either help your kidneys eliminate uric acid from your body or decrease your uric acid production. You may need to stay on these medicines for a very long time.  The early phase of treatment with preventive medicine can be associated with an increase in acute gout attacks. For this reason, during the first few months of treatment, your caregiver may also advise you to take medicines usually used for acute gout treatment. Be sure you understand your caregiver's directions.  Your caregiver may make several adjustments to your medicine dose before these medicines are effective.  Discuss dietary treatment with your caregiver or dietitian. Alcohol and drinks high in sugar and fructose and foods such as meat, poultry, and seafood can increase uric acid levels. Your caregiver or dietician can advise you on drinks and foods that should be limited. HOME CARE INSTRUCTIONS   Do not take aspirin to relieve pain. This raises uric acid levels.  Only take over-the-counter or prescription medicines for pain, discomfort, or fever as directed by your caregiver.  Rest the joint as much as possible. When in bed, keep sheets and blankets off painful areas.  Keep the affected joint raised (elevated).  Apply warm or cold treatments to painful joints. Use of warm or cold treatments depends on which works best for you.  Use crutches if the painful joint is in your leg.  Drink enough fluids to keep your urine clear or pale yellow. This helps your body get rid of uric acid. Limit alcohol, sugary drinks, and fructose drinks.  Follow your dietary instructions. Pay careful attention to the amount of protein you eat. Your daily diet should emphasize fruits, vegetables, whole grains, and fat-free or low-fat milk products. Discuss the use of coffee, vitamin C, and cherries with your caregiver or dietician. These may be helpful in lowering uric acid levels.  Maintain a healthy body weight. SEEK MEDICAL CARE IF:   You develop diarrhea, vomiting, or any side effects from medicines.  You do not feel better in 24 hours, or you are getting worse. SEEK IMMEDIATE MEDICAL CARE IF:   Your joint becomes suddenly more tender, and you have chills or a fever. MAKE SURE YOU:   Understand these instructions.  Will watch your condition.  Will get help right away if you are not doing well or get worse. Document Released: 12/05/2000 Document Revised: 04/04/2013 Document Reviewed:  07/21/2012 [facility] Patient Information 2014 [place].

## 2014-05-04 NOTE — Telephone Encounter (Signed)
Patient Information:  Caller Name: [name]  Phone: [phone]  Patient: Nathan Fernandez, Nathan Fernandez  Gender: Male  DOB: 09-04-1941  Age: 73 Years  PCP: Elsie Stain Brigitte Pulse) [facility])  Office Follow Up:  Does the office need to follow up with this patient?: No  Instructions For The Office: N/A   Symptoms  Reason For Call & Symptoms: Wife calling, he is pain in his right great toe since Wednesday 5/13.  Has had gout in the past.  He has pain even when he is not walking.  There is some swelling in the toe and redness at the joint.  Reviewed Health History In EMR: Yes  Reviewed Medications In EMR: Yes  Reviewed Allergies In EMR: Yes  Reviewed Surgeries / Procedures: Yes  Date of Onset of Symptoms: 05/03/2014  Guideline(s) Used:  Foot Pain  Disposition Per Guideline:   See Today in Office  Reason For Disposition Reached:   Severe pain (e.g., excruciating, unable to do any normal activities)  Advice Given:  N/A  Patient Will Follow Care Advice:  YES  Appointment Scheduled:  05/04/2014 16:00:00 Appointment Scheduled Provider:  Viviana Simpler [facility])

## 2014-05-04 NOTE — Telephone Encounter (Signed)
Call from Pt's wife, if pravastatin could be causing his fatigue.  Okay to hold for 1-2 weeks and see if he feels better.  Please notify me one way or the other.  Thanks.

## 2014-07-29 ENCOUNTER — Other Ambulatory Visit: Payer: Self-pay | Admitting: Family Medicine

## 2014-08-17 ENCOUNTER — Other Ambulatory Visit: Payer: Self-pay | Admitting: Family Medicine

## 2014-08-17 NOTE — Telephone Encounter (Signed)
Sent. Thanks.   

## 2014-08-17 NOTE — Telephone Encounter (Signed)
Electronic refill request. Last Filled:   04/17/2014.  Please advise.

## 2014-08-23 ENCOUNTER — Other Ambulatory Visit: Payer: Self-pay | Admitting: Family Medicine

## 2014-08-23 DIAGNOSIS — Z135 Encounter for screening for eye and ear disorders: Secondary | ICD-10-CM

## 2014-09-27 ENCOUNTER — Telehealth: Payer: Self-pay

## 2014-09-27 NOTE — Telephone Encounter (Signed)
Spoke with Leanor Kail, who stated that patient is currently working in [place].  She did take a message making pt aware that he needs an annual eye exam.  Ms. Gerilyn Nestle stated that he may be able to schedule it on one of the Fridays when he's in town, but either way, she will make him aware.

## 2014-12-16 ENCOUNTER — Other Ambulatory Visit: Payer: Self-pay | Admitting: Family Medicine

## 2014-12-18 NOTE — Telephone Encounter (Signed)
Electronic refill request. Last Filled:    90 tablet 1RF on 08/17/2014  Please advise.

## 2014-12-19 ENCOUNTER — Encounter: Payer: Self-pay | Admitting: Family Medicine

## 2014-12-19 ENCOUNTER — Ambulatory Visit (INDEPENDENT_AMBULATORY_CARE_PROVIDER_SITE_OTHER): Payer: Medicare Other | Admitting: Family Medicine

## 2014-12-19 VITALS — BP 142/80 | HR 71 | Temp 98.2°F | Wt 191.8 lb

## 2014-12-19 DIAGNOSIS — G4700 Insomnia, unspecified: Secondary | ICD-10-CM

## 2014-12-19 DIAGNOSIS — E119 Type 2 diabetes mellitus without complications: Secondary | ICD-10-CM

## 2014-12-19 DIAGNOSIS — M5430 Sciatica, unspecified side: Secondary | ICD-10-CM

## 2014-12-19 LAB — LIPID PANEL
CHOL/HDL RATIO: 6
Cholesterol: 183 mg/dL (ref 0–200)
HDL: 29.3 mg/dL — AB (ref >39.00)
NonHDL: 153.7
VLDL: 82.4 mg/dL — ABNORMAL HIGH (ref 0.0–40.0)

## 2014-12-19 LAB — HEMOGLOBIN A1C: Hgb A1c MFr Bld: 7.1 % — ABNORMAL HIGH (ref 4.6–6.5)

## 2014-12-19 LAB — LDL CHOLESTEROL, DIRECT: Direct LDL: 91.2 mg/dL

## 2014-12-19 MED ORDER — TRAZODONE HCL 100 MG PO TABS: ORAL_TABLET | ORAL | Status: DC

## 2014-12-19 MED ORDER — INDOMETHACIN 50 MG PO CAPS: 50.0000 mg | ORAL_CAPSULE | Freq: Two times a day (BID) | ORAL | Status: DC

## 2014-12-19 NOTE — Patient Instructions (Signed)
Go to the lab on the way out.  We'll contact you with your lab report. Call about an eye appointment.  Try taking 1.5 tabs of trazodone at night.  Let me know if that doesn't help.  Use the indomethacin if you have another gout flare.

## 2014-12-19 NOTE — Telephone Encounter (Signed)
Sent!

## 2014-12-19 NOTE — Telephone Encounter (Signed)
E-prescribing error.  Medication phoned to pharmacy.

## 2014-12-19 NOTE — Progress Notes (Signed)
Pre visit review using our clinic review tool, if applicable. No additional management support is needed unless otherwise documented below in the visit note.  Nighttime waking.  He is going to sleep on a consistent time ~10:30, still waking mult times, then up for the day by 6:30.  He is still relatively fatigued but he likely has more energy than most people his age.  He didn't inc his trazodone to 150mg  at night prev.  He napping some during the day.   Due for A1c today.  No DM2 meds.   D/w pt.  See notes on labs.    L leg pain. Lateral, down the extremity.  No trauma.  Not stretching.  When prev off statin, no change in sx.    Meds, vitals, and allergies reviewed.   ROS: See HPI.  Otherwise, noncontributory.  GEN: nad, alert and oriented HEENT: mucous membranes moist NECK: supple w/o LA CV: rrr. PULM: ctab, no inc wob ABD: soft, +bs EXT: no edema SKIN: no acute rash Back w/o midline pain. L hamstring tight, no pain on L hip rom o/w.  Distally nv intact w/o weakness.

## 2014-12-20 NOTE — Assessment & Plan Note (Signed)
D/w pt about inc in trazodone.  He likely has a lower than normal sleep requirement.

## 2014-12-20 NOTE — Assessment & Plan Note (Signed)
He isn't stretching and that would be the needed intervention.  D/w pt. He agrees.

## 2014-12-20 NOTE — Assessment & Plan Note (Signed)
See notes on labs, no meds at this point.

## 2014-12-21 ENCOUNTER — Other Ambulatory Visit: Payer: Self-pay | Admitting: *Deleted

## 2014-12-25 ENCOUNTER — Telehealth: Payer: Self-pay

## 2014-12-25 MED ORDER — BENZONATATE 200 MG PO CAPS: 200.0000 mg | ORAL_CAPSULE | Freq: Three times a day (TID) | ORAL | Status: DC | PRN

## 2014-12-25 NOTE — Telephone Encounter (Signed)
Nathan Fernandez left v/m; pt was seen 12/19/14 and pt had slight S/T at that time; pt had to go to [facility] and request med sent to [facility] [place], pt will not return home for 3-4 weeks; now pt has worsened S/T with congestion and prod cough (not sure of color)No fever. Taking Tylenol for cold and Delsym; not really helping.[name] request cb.

## 2014-12-25 NOTE — Telephone Encounter (Signed)
Take tessalon for cough. If still worsening, then have him rechecked in [place]. rx for tessalon sent. Thanks.

## 2014-12-25 NOTE — Telephone Encounter (Signed)
Wife advised. 

## 2015-01-20 ENCOUNTER — Other Ambulatory Visit: Payer: Self-pay | Admitting: Family Medicine

## 2015-01-23 ENCOUNTER — Other Ambulatory Visit: Payer: Self-pay | Admitting: Family Medicine

## 2015-02-16 ENCOUNTER — Encounter: Payer: Self-pay | Admitting: Family Medicine

## 2015-02-16 ENCOUNTER — Ambulatory Visit (INDEPENDENT_AMBULATORY_CARE_PROVIDER_SITE_OTHER): Payer: Medicare Other | Admitting: Family Medicine

## 2015-02-16 VITALS — BP 130/80 | HR 94 | Temp 98.2°F | Wt 198.5 lb

## 2015-02-16 DIAGNOSIS — R198 Other specified symptoms and signs involving the digestive system and abdomen: Secondary | ICD-10-CM | POA: Diagnosis not present

## 2015-02-16 DIAGNOSIS — H8110 Benign paroxysmal vertigo, unspecified ear: Secondary | ICD-10-CM

## 2015-02-16 MED ORDER — MECLIZINE HCL 12.5 MG PO TABS: 12.5000 mg | ORAL_TABLET | Freq: Three times a day (TID) | ORAL | Status: DC | PRN

## 2015-02-16 NOTE — Patient Instructions (Signed)
Likely BPV.  Benign positional vertigo.  Use the bedside exercises and take meclizine as needed.  Take care.  Glad to see you.

## 2015-02-16 NOTE — Progress Notes (Signed)
Pre visit review using our clinic review tool, if applicable. No additional management support is needed unless otherwise documented below in the visit note.  "Feeling unsteady."  Sunday AM before getting out of bed, or near the time he was getting out of bed, he felt a sensation of veritgo.  The room didn't spin but he didn't feel steady.  He can turn his head and it will come back.  He doesn't have trouble with eye tracking but had trouble with head movement.  No syncope, no presyncope.  Meclizine helped in initially, but not since.  No FCNAVD.    No ST, ear pain, no ear ringing.  No new sx o/w.  No sx like this before this week.  Not lightheaded.   Incidentally with occ L abd puffiness, will come up for a few hours, visibly puffy, then resolve.  No pain, no GI sx, no blood in stool.  No clear trigger.  Going on episodically for years.   He mentioned it today. Not painful.    Meds, vitals, and allergies reviewed.   ROS: See HPI.  Otherwise, noncontributory.  GEN: nad, alert and oriented HEENT: mucous membranes moist NECK: supple w/o LA CV: rrr.  PULM: ctab, no inc wob ABD: soft, +bs, not ttp, L side of abd very slightly more prominent but not tender EXT: no edema SKIN: no acute rash CN 2-12 wnl B, S/S/DTR wnl x4 DHP positive.

## 2015-02-18 DIAGNOSIS — H8110 Benign paroxysmal vertigo, unspecified ear: Secondary | ICD-10-CM | POA: Insufficient documentation

## 2015-02-18 DIAGNOSIS — R198 Other specified symptoms and signs involving the digestive system and abdomen: Secondary | ICD-10-CM | POA: Insufficient documentation

## 2015-02-18 NOTE — Assessment & Plan Note (Signed)
Has been going on for years, w/o GI sx, so no cause for alarm- if this were ominous, it wouldn't likely be intermittent, he would have other sx, and he would have already been ill from it.  This could be abd wall irritation.  i don't feel a hernia.  Would observe for now.  He agrees.  Notify me if GI sx or if worsening.  He agrees.

## 2015-02-18 NOTE — Assessment & Plan Note (Signed)
Likely, with pos DHP.  D/w pt about bedside exercises and using more meclizine in the meantime.  He agrees.  Path/phys d/w pt.

## 2015-03-08 ENCOUNTER — Encounter: Payer: Self-pay | Admitting: Family Medicine

## 2015-03-08 ENCOUNTER — Telehealth: Payer: Self-pay | Admitting: Family Medicine

## 2015-03-08 ENCOUNTER — Ambulatory Visit (INDEPENDENT_AMBULATORY_CARE_PROVIDER_SITE_OTHER): Payer: Medicare Other | Admitting: Family Medicine

## 2015-03-08 VITALS — BP 132/82 | HR 103 | Temp 98.0°F | Wt 193.0 lb

## 2015-03-08 DIAGNOSIS — L255 Unspecified contact dermatitis due to plants, except food: Secondary | ICD-10-CM

## 2015-03-08 MED ORDER — PREDNISONE 20 MG PO TABS: ORAL_TABLET | ORAL | Status: DC

## 2015-03-08 NOTE — Telephone Encounter (Signed)
Pt has appt 03/08/15 at 4:45 with Dr Lorelei Pont.

## 2015-03-08 NOTE — Progress Notes (Signed)
Pre visit review using our clinic review tool, if applicable. No additional management support is needed unless otherwise documented below in the visit note. 

## 2015-03-08 NOTE — Progress Notes (Signed)
Dr. Karleen Hampshire T. Danyka Merlin, MD, CAQ Sports Medicine [facility] [address] [place], [address] Phone: [phone] Fax: [phone]  03/08/2015  Patient: Nathan Fernandez, MRN: [id number], DOB: May 27, 1941, 74 y.o.  Primary Physician:  Crawford Givens, MD  Chief Complaint: Rash  Subjective:   Nathan Fernandez is a 74 y.o. very pleasant male patient who presents with the following:  Lab Results  Component Value Date   HGBA1C 7.1* 12/19/2014    Significant poison ivy exposure over the weekend, continues to spread up arms and now up neck and right side of face.   Past Medical History, Surgical History, Social History, Family History, Problem List, Medications, and Allergies have been reviewed and updated if relevant.   GEN: No acute illnesses, no fevers, chills. GI: No n/v/d, eating normally Pulm: No SOB Interactive and getting along well at home.  Otherwise, ROS is as per the HPI.  Objective:   BP 132/82 mmHg  Pulse 103  Temp(Src) 98 F (36.7 C) (Oral)  Wt 193 lb (87.544 kg)  SpO2 93%  GEN: WDWN, NAD, Non-toxic, A & O x 3 HEENT: Atraumatic, Normocephalic. Neck supple. No masses, No LAD. Ears and Nose: No external deformity. CV: RRR, No M/G/R. No JVD. No thrill. No extra heart sounds. PULM: CTA B, no wheezes, crackles, rhonchi. No retractions. No resp. distress. No accessory muscle use. EXTR: No c/c/e NEURO Normal gait.  PSYCH: Normally interactive. Conversant. Not depressed or anxious appearing.  Calm demeanor.   Classic vesicular dermatitis rash, some linear  Laboratory and Imaging Data:  Assessment and Plan:   Dermatitis due to plants, including poison ivy, sumac, and oak  Severe, involving face.  Follow-up: No Follow-up on file.  New Prescriptions   PREDNISONE (DELTASONE) 20 MG TABLET    2 tablets a day for 7 days, then 1 tab for 7 days   No orders of the defined types were placed in this encounter.    Signed,  Elpidio Galea.  Gena [name], MD   Patient's Medications  New Prescriptions   PREDNISONE (DELTASONE) 20 MG TABLET    2 tablets a day for 7 days, then 1 tab for 7 days  Previous Medications   ASPIRIN 81 MG EC TABLET    Take 81 mg by mouth daily.     BENZONATATE (TESSALON) 200 MG CAPSULE    Take 1 capsule (200 mg total) by mouth 3 (three) times daily as needed for cough.   CYCLOBENZAPRINE (FLEXERIL) 5 MG TABLET    Take 2 tablets (10 mg total) by mouth 3 (three) times daily as needed.   ENALAPRIL (VASOTEC) 10 MG TABLET    TAKE 1 TABLET BY MOUTH DAILY   GEMFIBROZIL (LOPID) 600 MG TABLET    TAKE 1 TABLET BY MOUTH TWICE DAILY.   GINKGO BILOBA (GINKOBA) 40 MG TABS    Take by mouth daily.     GINSENG 250 MG CAPS    Take by mouth daily.     INDOMETHACIN (INDOCIN) 50 MG CAPSULE    Take 1 capsule (50 mg total) by mouth 2 (two) times daily with a meal. For gout   LANCETS (ONETOUCH ULTRASOFT) LANCETS    1 each by Other route daily. Use as instructed    MECLIZINE (ANTIVERT) 12.5 MG TABLET    Take 1-2 tablets (12.5-25 mg total) by mouth 3 (three) times daily as needed for dizziness.   MULTIPLE VITAMIN (MULTIVITAMIN) TABLET    Take 1 tablet by mouth daily.  NITROGLYCERIN (NITROSTAT) 0.4 MG SL TABLET    Place one under tongue at onset of chest pain, may repeat in 5 minutes   NON FORMULARY    Green Tea Extract Take 2 tablets by mouth twice daily.   NON FORMULARY    Take 1 tablet by mouth daily. Super beta prostate   OMEGA-3 FATTY ACIDS (FISH OIL) 1000 MG CAPS    Take by mouth daily.     PRAVASTATIN (PRAVACHOL) 40 MG TABLET    TAKE 1 TABLET BY MOUTH AT BEDTIME   SPECIALTY VITAMINS PRODUCTS (PROSTATE PO)    Take one tab twice daily   TRAZODONE (DESYREL) 100 MG TABLET    TAKE 1-1.5 TABLETS BY MOUTH AT BEDTIME.   VITAMIN E 400 UNIT CAPSULE    Take 400 Units by mouth daily.     VITAMINS-LIPOTROPICS (B-100 COMPLEX PO)    Take by mouth daily.    Modified Medications   No medications on file  Discontinued Medications   No  medications on file

## 2015-03-08 NOTE — Telephone Encounter (Signed)
Patient Name: REILEY KEISLER DOB: Aug 10, 1941 Initial Comment Caller states her husband was helping her with a tree that had blown down, cleaning up the area. He has a rash on his right arm, and right side of his face. His is spreading. Itching. Welts. His wife is not a patient, but she has the same thing, but not as bad. Nurse Assessment Nurse: Marcelline Deist, RN, Lynda Date/Time (Eastern Time): 03/08/2015 10:41:03 AM Confirm and document reason for call. If symptomatic, describe symptoms. ---Caller states her husband was helping her with a tree that had blown down, cleaning up the area. He has a rash on his right arm, and right side of his face. His is spreading. Itching. Welts. His wife is not a patient, but she has the same thing, but not as bad. Not sure if it is poison oak/ivy or what. Caladryl doesn't seem to help. Has the patient traveled out of the country within the last 30 days? ---Not Applicable Does the patient require triage? ---Yes Related visit to physician within the last 2 weeks? ---No Does the PT have any chronic conditions? (i.e. diabetes, asthma, etc.) ---Yes List chronic conditions. ---was on Metformin, on BP rx, cholesterol rx Guidelines Guideline Title Affirmed Question Affirmed Notes Poison Ivy - [place] [1] Face, eyes, lips or genitals are involved AND [2] more than a small rash Final Disposition User See Physician within [place] [facility] [place], Therapist, sports, [place]

## 2015-04-02 DIAGNOSIS — Z961 Presence of intraocular lens: Secondary | ICD-10-CM | POA: Diagnosis not present

## 2015-04-22 ENCOUNTER — Other Ambulatory Visit: Payer: Self-pay | Admitting: Family Medicine

## 2015-05-25 ENCOUNTER — Encounter: Payer: Self-pay | Admitting: Family Medicine

## 2015-05-25 ENCOUNTER — Ambulatory Visit (INDEPENDENT_AMBULATORY_CARE_PROVIDER_SITE_OTHER): Payer: Medicare Other | Admitting: Family Medicine

## 2015-05-25 VITALS — BP 120/70 | HR 75 | Temp 97.8°F | Ht 66.75 in | Wt 191.2 lb

## 2015-05-25 DIAGNOSIS — M25562 Pain in left knee: Secondary | ICD-10-CM

## 2015-05-25 DIAGNOSIS — M25561 Pain in right knee: Secondary | ICD-10-CM | POA: Insufficient documentation

## 2015-05-25 MED ORDER — DICLOFENAC SODIUM 75 MG PO TBEC: 75.0000 mg | DELAYED_RELEASE_TABLET | Freq: Two times a day (BID) | ORAL | Status: DC

## 2015-05-25 NOTE — Progress Notes (Signed)
Pre visit review using our clinic review tool, if applicable. No additional management support is needed unless otherwise documented below in the visit note. 

## 2015-05-25 NOTE — Patient Instructions (Signed)
Can apply ice to knee.  Start diclofenac 75 mg twice daily . Take with food.  Follow up with Dr. Damita Dunnings or Dr. Diona Browner if not improving in 2 weeks.

## 2015-05-25 NOTE — Assessment & Plan Note (Signed)
No clear sign of meniscal injury,fracture.f Not consistent with gouty flare. 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  teat with ice, NSAIDS, limit knee squatting, stairs etc.

## 2015-05-25 NOTE — Progress Notes (Signed)
   Subjective:    Patient ID: Nathan Fernandez, male    DOB: 01-09-1941, 74 y.o.   MRN: [id number]  HPI  74 year old male patient  Of Dr. Josefine Class with history of CAD, DM, COPD, gout and chronic sciatica presents with bilateral knee pain. Pain is intermittent.  Started after playing golf 1 month ago. Not worsening. He has been applying salonpas, austrailian cream.  Helps a little.  Using ibuprofen 400 mg twice daily on bad days. Helps some.  Wose with squatting, trouble getting up if sitting low. Pain with rest and with walking. Pain 3-7/10 on pain scale. No clicking, popping, no grinding.  No giving way.  No swelling , no redness.       Review of Systems  Constitutional: Negative for fatigue.  Respiratory: Negative for shortness of breath.   Cardiovascular: Negative for chest pain.  Gastrointestinal: Negative for abdominal pain.  Musculoskeletal: Positive for back pain.  Neurological: Negative for numbness.       Objective:   Physical Exam  Constitutional: Vital signs are normal. He appears well-developed and well-nourished.  HENT:  Head: Normocephalic.  Right Ear: Hearing normal.  Left Ear: Hearing normal.  Nose: Nose normal.  Mouth/Throat: Oropharynx is clear and moist and mucous membranes are normal.  Neck: Trachea normal. Carotid bruit is not present. No thyroid mass and no thyromegaly present.  Cardiovascular: Normal rate, regular rhythm and normal pulses.  Exam reveals no gallop, no distant heart sounds and no friction rub.   No murmur heard. No peripheral edema  Pulmonary/Chest: Effort normal and breath sounds normal. No respiratory distress.  Musculoskeletal:       Right knee: He exhibits normal range of motion, no swelling, no effusion, no bony tenderness, normal meniscus and no MCL laxity. Tenderness found. Medial joint line and lateral joint line tenderness noted. No MCL, no LCL and no patellar tendon tenderness noted.       Left knee: He exhibits normal range  of motion, no swelling, no effusion, no ecchymosis, no bony tenderness, normal meniscus and no MCL laxity. Tenderness found. Medial joint line and lateral joint line tenderness noted. No MCL, no LCL and no patellar tendon tenderness noted.  Neg McMurray's, nml anterior and post drawer.  Skin: Skin is warm, dry and intact. No rash noted.  Psychiatric: He has a normal mood and affect. His speech is normal and behavior is normal. Thought content normal.          Assessment & Plan:

## 2015-06-06 ENCOUNTER — Other Ambulatory Visit: Payer: Self-pay | Admitting: Family Medicine

## 2015-06-06 NOTE — Telephone Encounter (Signed)
Sent, but f/u if not improving.  Thanks.

## 2015-06-06 NOTE — Telephone Encounter (Signed)
Last office visit 05/25/2015.  Last refilled 05/25/2015 for #30 (15 day supply).  Ok to refill?  Increase quantity to #60?

## 2015-06-06 NOTE — Telephone Encounter (Signed)
Patient notified as instructed by telephone and verbalized understanding. 

## 2015-06-18 ENCOUNTER — Other Ambulatory Visit: Payer: Self-pay

## 2015-06-22 ENCOUNTER — Other Ambulatory Visit: Payer: Self-pay | Admitting: Family Medicine

## 2015-10-21 ENCOUNTER — Other Ambulatory Visit: Payer: Self-pay | Admitting: Family Medicine

## 2015-11-28 ENCOUNTER — Other Ambulatory Visit: Payer: Self-pay | Admitting: Family Medicine

## 2015-11-28 NOTE — Telephone Encounter (Signed)
Send in when he schedules CPE with labs ahead of time.  Overdue by 6 months.  Thanks.

## 2015-11-28 NOTE — Telephone Encounter (Signed)
Received refill request electronically. Last refill 12/19/14 #135/3 refills Last office visit 05/25/15 Is it okay to refill medication?

## 2015-11-28 NOTE — Telephone Encounter (Signed)
Please call and schedule appointment as instructed. Send back for refill when appointments are scheduled.

## 2015-11-29 NOTE — Telephone Encounter (Signed)
Labs 2/13 cpx 2/17 Please close Pt aware

## 2015-12-09 ENCOUNTER — Other Ambulatory Visit: Payer: Self-pay | Admitting: Family Medicine

## 2015-12-10 NOTE — Telephone Encounter (Signed)
Received refill electronically Last refill 12/19/14 #30/1 Last office visit 05/25/15/acute Is it okay to refill?

## 2015-12-11 NOTE — Telephone Encounter (Signed)
Sent.  Has CPE scheduled.  Thanks.

## 2015-12-29 ENCOUNTER — Other Ambulatory Visit: Payer: Self-pay | Admitting: Family Medicine

## 2015-12-31 NOTE — Telephone Encounter (Signed)
Electronic refill request. Last Filled:    45 tablet 0 11/29/2015  Last office visit:   June 2016.  Please advise.

## 2016-01-27 ENCOUNTER — Other Ambulatory Visit: Payer: Self-pay | Admitting: Family Medicine

## 2016-01-27 DIAGNOSIS — E119 Type 2 diabetes mellitus without complications: Secondary | ICD-10-CM

## 2016-01-28 ENCOUNTER — Other Ambulatory Visit: Payer: Self-pay | Admitting: Family Medicine

## 2016-01-28 NOTE — Telephone Encounter (Signed)
Received refill electronically Last refill 01/01/16 #45 Last office visit 05/25/15/acute Is it okay to refill?

## 2016-01-29 NOTE — Telephone Encounter (Signed)
Sent. Thanks.   

## 2016-01-30 ENCOUNTER — Telehealth: Payer: Self-pay | Admitting: Family Medicine

## 2016-01-30 NOTE — Telephone Encounter (Signed)
PLEASE NOTE: All timestamps contained within this report are represented as [place] Standard Time. CONFIDENTIALTY NOTICE: This fax transmission is intended only for the addressee. It contains information that is legally privileged, confidential or otherwise protected from use or disclosure. If you are not the intended recipient, you are strictly prohibited from reviewing, disclosing, copying using or disseminating any of this information or taking any action in reliance on or regarding this information. If you have received this fax in error, please notify us immediately by telephone so that we can arrange for its return to [place]. Phone: [phone], Toll-Free: [phone], Fax: [phone] Page: 1 of 2 Call Id: [id number] [place] Patient Name: Nathan Fernandez Gender: Male DOB: 1941/03/21 Age: 75 Y 79 M 18 D Return Phone Number: [phone] (Primary) Address: City/State/Zip: [place] Client [place] Night - Client Client Site [place] Physician Renford Dills Contact Type Call Call Type Triage / Clinical Caller Name Rone Lavell Relationship To Patient Spouse Return Phone Number [phone] (Primary) Chief Complaint Back Pain - General Initial Comment Caller states her husband has an appointment next Friday - has sharp back and right shoulder blade pain, heat helps temporarily. *Remade from CID [id number]* PreDisposition Call Doctor Translation No Nurse Assessment Nurse: Melina Copa, RN, Charles Date/Time ([place] Time): 01/30/2016 4:53:51 PM Confirm and document reason for call. If symptomatic, describe symptoms. You must click the next button to save text entered. ---Caller states her husband has an appointment next Friday - has sharp back and right shoulder blade pain, heat helps temporarily. states it comes from waist and pain shoots  upward to right shoulder blade pain, taking muscle relaxers and heat seems to help a little. Has the patient traveled out of the country within the last 30 days? ---No Does the patient have any new or worsening symptoms? ---Yes Will a triage be completed? ---Yes Related visit to physician within the last 2 weeks? ---No Does the PT have any chronic conditions? (i.e. diabetes, asthma, etc.) ---Yes List chronic conditions. ---HTN, heart attack in 1992, hyperlipidemia, Is this a behavioral health or substance abuse call? ---No Guidelines Guideline Title Affirmed Question Affirmed Notes Nurse Date/Time Eilene Ghazi Time) Shoulder Pain [1] SEVERE pain AND [2] not improved 2 hours after pain medicine Melina Copa, RN, Juanda Crumble 01/30/2016 4:56:40 PM Disp. Time Eilene Ghazi Time) Disposition Final User 01/30/2016 4:32:46 PM Attempt made - no message left Thedore Mins 01/30/2016 4:39:55 PM Attempt made - no message left Melina Copa, RN, Juanda Crumble PLEASE NOTE: All timestamps contained within this report are represented as [place] Standard Time. CONFIDENTIALTY NOTICE: This fax transmission is intended only for the addressee. It contains information that is legally privileged, confidential or otherwise protected from use or disclosure. If you are not the intended recipient, you are strictly prohibited from reviewing, disclosing, copying using or disseminating any of this information or taking any action in reliance on or regarding this information. If you have received this fax in error, please notify us immediately by telephone so that we can arrange for its return to [place]. Phone: [phone], Toll-Free: [phone], Fax: [phone] Page: 2 of 2 Call Id: [id number] [place]. Time Eilene Ghazi Time) Disposition Final User 01/30/2016 5:01:05 PM Go to ED Now (or PCP triage) Melina Copa, RN, Charles 01/30/2016 5:01:08 PM Go to ED Now (or PCP triage) Yes Melina Copa, RN, Recardo Evangelist Understands: Yes Disagree/Comply: Comply Care Advice  Given Per Guideline GO TO ED NOW (OR  PCP TRIAGE): * IF NO PCP TRIAGE: You need to be seen. Go to the [facility] at [facility] within the next hour. Leave as soon as you can. CARE ADVICE given per Shoulder Pain (Adult) guideline Comments User: Ulla Gallo, RN Date/Time (Eastern Time): 01/30/2016 4:55:44 PM pain since working on tractor 2 weeks prior, assumed it was a muscle strain. Referrals [facility] - ED [facility] - [facility]

## 2016-01-30 NOTE — Telephone Encounter (Signed)
[place]  Patient Name: Nathan Fernandez  DOB: 1941/03/27    Initial Comment Caller states her husband has an appointment next Friday - has sharp back and right shoulder blade pain, heat helps temporarily.   Nurse Assessment  Nurse: Wayne Sever, RN, Tillie Rung Date/Time (Eastern Time): 01/30/2016 3:29:39 PM  Confirm and document reason for call. If symptomatic, describe symptoms. You must click the next button to save text entered. ---Caller is not with her husband. He is out playing golf per wife. Informed him wife I cannot triage him unless she is with him. She is going to call back when he gets home. It will be later this evening per wife. He will call back the advice line when he gets home  Has the patient traveled out of the country within the last 30 days? ---Not Applicable  Does the patient have any new or worsening symptoms? ---Yes  Will a triage be completed? ---No  Select reason for no triage. ---Other  Please document clinical information provided and list any resource used. ---He is not home and I did not have a way to speak with him for triage he was out playing golf     Guidelines    Guideline Title Affirmed Question Affirmed Notes       Final Disposition User   Clinical Call

## 2016-01-30 NOTE — Telephone Encounter (Signed)
I'll await the f/u triage note.  If he is out on the golf course, then doesn't sound emergent.

## 2016-01-31 NOTE — Telephone Encounter (Signed)
Spoke with wife who says this has been going on for about a week and a half.  They think he has pulled a muscle while working on his tractor.  He has been taking muscle relaxers and using a heating pad and it gets better but then they played golf Monday and yesterday and it flared up again.  Patient now understands that he probably needs to give it a rest for a while to let it heal.  Patient will call back for a sooner appt if necessary.

## 2016-01-31 NOTE — Telephone Encounter (Signed)
Please see if patient can come in, get added to schedule, if needed. Thanks.

## 2016-02-04 ENCOUNTER — Other Ambulatory Visit (INDEPENDENT_AMBULATORY_CARE_PROVIDER_SITE_OTHER): Payer: Medicare Other

## 2016-02-04 DIAGNOSIS — E119 Type 2 diabetes mellitus without complications: Secondary | ICD-10-CM

## 2016-02-04 LAB — COMPREHENSIVE METABOLIC PANEL
ALT: 31 U/L (ref 0–53)
AST: 24 U/L (ref 0–37)
Albumin: 4.1 g/dL (ref 3.5–5.2)
Alkaline Phosphatase: 44 U/L (ref 39–117)
BUN: 21 mg/dL (ref 6–23)
CALCIUM: 9.7 mg/dL (ref 8.4–10.5)
CHLORIDE: 104 meq/L (ref 96–112)
CO2: 28 meq/L (ref 19–32)
CREATININE: 0.92 mg/dL (ref 0.40–1.50)
GFR: 85.15 mL/min (ref >60.00)
GLUCOSE: 154 mg/dL — AB (ref 70–99)
Potassium: 4.2 mEq/L (ref 3.5–5.1)
SODIUM: 139 meq/L (ref 135–145)
Total Bilirubin: 0.5 mg/dL (ref 0.2–1.2)
Total Protein: 7 g/dL (ref 6.0–8.3)

## 2016-02-04 LAB — LDL CHOLESTEROL, DIRECT: Direct LDL: 98 mg/dL

## 2016-02-04 LAB — LIPID PANEL
CHOL/HDL RATIO: 6
Cholesterol: 167 mg/dL (ref 0–200)
HDL: 28.1 mg/dL — ABNORMAL LOW (ref >39.00)
NONHDL: 139.23
TRIGLYCERIDES: 250 mg/dL — AB (ref 0.0–149.0)
VLDL: 50 mg/dL — ABNORMAL HIGH (ref 0.0–40.0)

## 2016-02-04 LAB — HEMOGLOBIN A1C: Hgb A1c MFr Bld: 6.8 % — ABNORMAL HIGH (ref 4.6–6.5)

## 2016-02-06 ENCOUNTER — Telehealth: Payer: Self-pay | Admitting: Family Medicine

## 2016-02-06 ENCOUNTER — Ambulatory Visit: Payer: Medicare Other

## 2016-02-06 NOTE — Telephone Encounter (Signed)
Patient did not come for their scheduled appointment today for medicare wellness.  Please let me know if the patient needs to be contacted immediately for follow up or if no follow up is necessary.

## 2016-02-08 ENCOUNTER — Encounter: Payer: Self-pay | Admitting: Family Medicine

## 2016-02-08 ENCOUNTER — Ambulatory Visit (INDEPENDENT_AMBULATORY_CARE_PROVIDER_SITE_OTHER): Payer: Medicare Other

## 2016-02-08 ENCOUNTER — Ambulatory Visit (INDEPENDENT_AMBULATORY_CARE_PROVIDER_SITE_OTHER): Payer: Medicare Other | Admitting: Family Medicine

## 2016-02-08 VITALS — BP 120/80 | HR 77 | Temp 97.8°F | Ht 67.0 in | Wt 193.0 lb

## 2016-02-08 DIAGNOSIS — M109 Gout, unspecified: Secondary | ICD-10-CM | POA: Diagnosis not present

## 2016-02-08 DIAGNOSIS — I10 Essential (primary) hypertension: Secondary | ICD-10-CM | POA: Diagnosis not present

## 2016-02-08 DIAGNOSIS — G4700 Insomnia, unspecified: Secondary | ICD-10-CM

## 2016-02-08 DIAGNOSIS — Z23 Encounter for immunization: Secondary | ICD-10-CM | POA: Diagnosis not present

## 2016-02-08 DIAGNOSIS — E7800 Pure hypercholesterolemia, unspecified: Secondary | ICD-10-CM | POA: Diagnosis not present

## 2016-02-08 DIAGNOSIS — R399 Unspecified symptoms and signs involving the genitourinary system: Secondary | ICD-10-CM

## 2016-02-08 DIAGNOSIS — Z0000 Encounter for general adult medical examination without abnormal findings: Secondary | ICD-10-CM

## 2016-02-08 DIAGNOSIS — E119 Type 2 diabetes mellitus without complications: Secondary | ICD-10-CM

## 2016-02-08 MED ORDER — TAMSULOSIN HCL 0.4 MG PO CAPS: 0.4000 mg | ORAL_CAPSULE | Freq: Every day | ORAL | Status: DC

## 2016-02-08 MED ORDER — NITROGLYCERIN 0.4 MG SL SUBL: SUBLINGUAL_TABLET | SUBLINGUAL | Status: DC

## 2016-02-08 MED ORDER — PRAVASTATIN SODIUM 40 MG PO TABS: 40.0000 mg | ORAL_TABLET | Freq: Every day | ORAL | Status: DC

## 2016-02-08 MED ORDER — CYCLOBENZAPRINE HCL 5 MG PO TABS: 5.0000 mg | ORAL_TABLET | Freq: Three times a day (TID) | ORAL | Status: DC | PRN

## 2016-02-08 MED ORDER — TRAZODONE HCL 100 MG PO TABS: 200.0000 mg | ORAL_TABLET | Freq: Every day | ORAL | Status: DC

## 2016-02-08 MED ORDER — GEMFIBROZIL 600 MG PO TABS: 600.0000 mg | ORAL_TABLET | Freq: Two times a day (BID) | ORAL | Status: DC

## 2016-02-08 MED ORDER — ENALAPRIL MALEATE 10 MG PO TABS: 10.0000 mg | ORAL_TABLET | Freq: Every day | ORAL | Status: DC

## 2016-02-08 NOTE — Progress Notes (Signed)
Pre visit review using our clinic review tool, if applicable. No additional management support is needed unless otherwise documented below in the visit note.  Recheck pulse ox 92%.   Diabetes:  No meds.   Hypoglycemic episodes:no Hyperglycemic episodes:no Feet problems:no Blood Sugars averaging: not checked often eye exam within last year:yes A1c some better.  D/w pt.   Hypertension:    Using medication without problems or lightheadedness: yes Chest pain with exertion:no Edema:no Short of breath:no Labs d/w pt.    Elevated Cholesterol: Using medications without problems:yes Muscle aches: not from statin, but only as age expected.   Diet compliance: d/w pt . Exercise: encouraged.   Gout.  Rare flares.  Uses indocin prn, rarely.  No sx now.    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  Compliant. Able to sleep with med dose.  No parasomnias.    LUTS.  Delayed start to stream, dec in stream volume; has frequency, urgency.  Not burning with urination.  D/w pt about pros and cons of PSA check. Not on meds currently.    PMH and SH reviewed  ROS: See HPI, otherwise noncontributory.  Meds, vitals, and allergies reviewed.     PMH and SH reviewed.   Vital signs, Meds and allergies reviewed.  ROS: See HPI.  Otherwise nontributory.   GEN: nad, alert and oriented HEENT: mucous membranes moist NECK: supple w/o LA CV: rrr.  no murmur PULM: ctab, no inc wob ABD: soft, +bs EXT: no edema SKIN: no acute rash Prostate gland firm and smooth, mild symmetric enlargement, but no nodularity, tenderness, mass, asymmetry or induration.  Diabetic foot exam: Normal inspection No skin breakdown No calluses  Normal DP pulses Normal sensation to light tough and monofilament Nails thickened.

## 2016-02-08 NOTE — Patient Instructions (Addendum)
Nathan Fernandez , Thank you for taking time to come for your Medicare Wellness Visit. I appreciate your ongoing commitment to your health goals. Please review the following plan we discussed and let me know if I can assist you in the future.   These are the goals we discussed: Goals    Starting 02/08/2016, I will decrease intake of starchy foods and increase intake of fresh fruits and vegetables to 5 servings day.       This is a list of the screening recommended for you and due dates:  Health Maintenance  Topic Date Due  . Eye exam for diabetics  Completed 2016  . Shingles Vaccine  Hold for insurance  . Pneumonia vaccines (1 of 2 - PCV13) Will Complete  . Tetanus Vaccine  Will Complete  . Complete foot exam   04/18/2015  . Flu Shot  Complete  . Hemoglobin A1C  08/03/2016  . Colon Cancer Screening  02/17/2021   Preventive Care for Adults  A healthy lifestyle and preventive care can promote health and wellness. Preventive health guidelines for women include the following key practices.  . A routine yearly physical is a good way to check with your health care provider about your health and preventive screening. It is a chance to share any concerns and updates on your health and to receive a thorough exam.  . Visit your dentist for a routine exam and preventive care every 6 months. Brush your teeth twice a day and floss once a day. Good oral hygiene prevents tooth decay and gum disease.  . The frequency of eye exams is based on your age, health, family medical history, use  of contact lenses, and other factors. Follow your health care provider's ecommendations for frequency of eye exams.  . Eat a healthy diet. Foods like vegetables, fruits, whole grains, low-fat dairy products, and lean protein foods contain the nutrients you need without too many calories. Decrease your intake of foods high in solid fats, added sugars, and salt. Eat the right amount of calories for you. Get information about  a proper diet from your health care provider, if necessary.  . Regular physical exercise is one of the most important things you can do for your health. Most adults should get at least 150 minutes of moderate-intensity exercise (any activity that increases your heart rate and causes you to sweat) each week. In addition, most adults need muscle-strengthening exercises on 2 or more days a week.  . Maintain a healthy weight. The body mass index (BMI) is a screening tool to identify possible weight problems. It provides an estimate of body fat based on height and weight. Your health care provider can find your BMI and can help you achieve or maintain a healthy weight.  For adults 20 years and older: ? A BMI below 18.5 is considered underweight. ? A BMI of 18.5 to 24.9 is normal. ? A BMI of 25 to 29.9 is considered overweight. ? A BMI of 30 and above is considered obese.   . Maintain normal blood lipids and cholesterol levels by exercising and minimizing your intake of saturated fat. Eat a balanced diet with plenty of fruit and vegetables. Blood tests for lipids and cholesterol should begin at age 46 and be repeated every 5 years. If your lipid or cholesterol levels are high, you are over 50, or you are at high risk for heart disease, you may need your cholesterol levels checked more frequently. Ongoing high lipid and cholesterol levels  should be treated with medicines if diet and exercise are not working.  . If you smoke, find out from your health care provider how to quit. If you do not use tobacco, please do not start.  . If you choose to drink alcohol, please do not consume more than 2 drinks per day. One drink is considered to be 12 ounces (355 mL) of beer, 5 ounces (148 mL) of wine, or 1.5 ounces (44 mL) of liquor.  . If you are 32-59 years old, ask your health care provider if you should take aspirin to prevent strokes.  . Osteoporosis is a disease in which the bones lose minerals and strength  with aging. This can result in serious bone fractures or breaks. The risk of osteoporosis can be identified using a bone density scan. Women ages 77 years and over and women at risk for fractures or osteoporosis should discuss screening with their health care providers. Ask your health care provider whether you should take a calcium supplement or vitamin D to reduce the rate of osteoporosis.  . Menopause can be associated with physical symptoms and risks. Hormone replacement therapy is available to decrease symptoms and risks. You should talk to your health care provider about whether hormone replacement therapy is right for you.  . Use sunscreen. Apply sunscreen liberally and repeatedly throughout the day. You should seek shade when your shadow is shorter than you. Protect yourself by wearing long sleeves, pants, a wide-brimmed hat, and sunglasses year round, whenever you are outdoors.  . Once a month, do a whole body skin exam, using a mirror to look at the skin on your back. Tell your health care provider of new moles, moles that have irregular borders, moles that are larger than a pencil eraser, or moles that have changed in shape or color.

## 2016-02-08 NOTE — Progress Notes (Signed)
Subjective:   Nathan Fernandez is a 75 y.o. male who presents for Medicare Annual/Preventive preventive examination.  Review of Systems:   No ROS  Cardiac Risk Factors include: advanced age (>27men, >80 women);dyslipidemia;family history of premature cardiovascular disease;hypertension;male gender;obesity (BMI >30kg/m2);sedentary lifestyle;smoking/ tobacco exposure     Objective:    Vitals: BP 120/80 mmHg  Pulse 77  Temp(Src) 97.8 F (36.6 C) (Oral)  Ht 5\' 7"  (1.702 m)  Wt 193 lb (87.544 kg)  BMI 30.22 kg/m2  SpO2 91%  Tobacco History  Smoking status  . Current Every Day Smoker  . Types: Cigars  Smokeless tobacco  . Never Used    Comment: cigar occassionally     Ready to quit: No Counseling given: No   Past Medical History  Diagnosis Date  . History of ETT 1998    wnl  . 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    Hosp R facial weakness  . History of MRI 08/04/09    brain- atrophy sm vess dz  . CAD (coronary artery disease)     MI, [place], PTCA; [place] 04/18/13: Distal left main 40%, proximal LAD with several aneurysmal segments, proximal LAD 50% prior to and after aneurysmal segments, area does not appear to flow-limiting, proximal diagonal 70%, ostial circumflex 20%, proximal circumflex 20%, OM1 30-40%, proximal RCA 40%, mid RCA 30%, distal RCA 20%, EF 50% => med Rx  . HTN (hypertension)   . HLD (hyperlipidemia)   . COPD (chronic obstructive pulmonary disease) ([place])   . DM2 (diabetes mellitus, type 2) ([place])    Past Surgical History  Procedure Laterality Date  . 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 Cataract surgery  09/06    repair lens which moved  . Stress myoview  06/29/06    sm distal anteroseptal & apical infarct   Family History  Problem Relation Age of Onset  . Hypertension Mother   . Aneurysm Mother   . Stroke Mother   . Hypertension Father   . Heart disease Father     CAD  . Heart disease Son     MI  . Colon cancer Neg Hx   . Prostate cancer Neg Hx     History  Sexual Activity  . Sexual Activity: No    Outpatient Encounter Prescriptions as of 02/08/2016  Medication Sig  . aspirin 81 MG EC tablet Take 81 mg by mouth daily.    . cyclobenzaprine (FLEXERIL) 5 MG tablet Take 2 tablets (10 mg total) by mouth 3 (three) times daily as needed.  . enalapril (VASOTEC) 10 MG tablet TAKE 1 TABLET BY MOUTH DAILY  . gemfibrozil (LOPID) 600 MG tablet TAKE 1 TABLET BY MOUTH TWICE DAILY.  . Ginkgo Biloba (GINKOBA) 40 MG TABS Take by mouth daily.    . Ginseng 250 MG CAPS Take by mouth daily.    . indomethacin (INDOCIN) 50 MG capsule TAKE ONE CAPSULE BY MOUTH TWICE A DAY AS NEEDED WITH A MEAL FOR GOUT  . Lancets (ONETOUCH ULTRASOFT) lancets 1 each by Other route daily. Use as instructed   . meclizine (ANTIVERT) 12.5 MG tablet Take 1-2 tablets (12.5-25 mg total) by mouth 3 (three) times daily as needed for dizziness.  . Multiple Vitamin (MULTIVITAMIN) tablet Take 1 tablet by mouth daily.    . NON FORMULARY Take 1 tablet by mouth daily. Super beta prostate  . Omega-3 Fatty Acids (FISH OIL) 1000 MG CAPS Take by mouth daily.    . pravastatin (PRAVACHOL)  40 MG tablet TAKE 1 TABLET BY MOUTH AT BEDTIME  . Specialty Vitamins Products (PROSTATE PO) Take one tab twice daily  . traZODone (DESYREL) 100 MG tablet TAKE 1-1.5 TABLETS BY MOUTH AT BEDTIME. (Patient taking differently: TAKE 2 TABLETS BY MOUTH AT BEDTIME.)  . vitamin E 400 UNIT capsule Take 400 Units by mouth daily.    . Vitamins-Lipotropics (B-100 COMPLEX PO) Take by mouth daily.    . diclofenac (VOLTAREN) 75 MG EC tablet TAKE 1 TABLET BY MOUTH TWICE A DAY  . nitroGLYCERIN (NITROSTAT) 0.4 MG SL tablet Place one under tongue at onset of chest pain, may repeat in 5 minutes  . NON FORMULARY Reported on 02/08/2016   No facility-administered encounter medications on file as of 02/08/2016.    Activities of Daily Living In your present state of health, do you have any difficulty performing the following  activities: 02/08/2016  Hearing? N  Vision? N  Difficulty concentrating or making decisions? N  Walking or climbing stairs? N  Dressing or bathing? N  Doing errands, shopping? N  Preparing Food and eating ? N  Using the Toilet? N  In the past six months, have you accidently leaked urine? N  Do you have problems with loss of bowel control? N  Managing your Medications? N  Managing your Finances? N  Housekeeping or managing your Housekeeping? N    Patient Care Team: Tonia Ghent, MD as PCP - General (Family Medicine)   No additional providers communicated by patient. Provider list is up to date.   Assessment:     Exercise Activities and Dietary recommendations Current Exercise Habits:: The patient does not participate in regular exercise at present  Goals     Starting 02/08/2016, I will decrease intake of starchy foods and increase intake of fresh fruits and vegetables to 5 servings day.        Fall Risk Fall Risk  02/08/2016 04/17/2014  Falls in the past year? No No   Depression Screen PHQ 2/9 Scores 02/08/2016 04/17/2014  PHQ - 2 Score 0 2    Cognitive Testing MMSE - Mini Mental State Exam 02/08/2016  Orientation to time 5  Orientation to Place 5  Registration 3  Attention/ Calculation 5  Recall 3  Language- repeat 1  Language- follow 3 step command 3  Language- read & follow direction 1    Immunization History  Administered Date(s) Administered  . Influenza Split 01/07/2012  . Td 09/01/2001   Screening Tests Health Maintenance  Topic Date Due  . OPHTHALMOLOGY EXAM  2016   . ZOSTAVAX  Hold for insurance  . PNA vac Low Risk Adult (1 of 2 - PCV13) Completed  . TETANUS/TDAP  Hold for insurance  . FOOT EXAM  04/18/2015  . INFLUENZA VACCINE  Completed  . HEMOGLOBIN A1C  08/03/2016  . COLONOSCOPY  02/17/2021      Plan:    During the course of the visit the patient was educated and counseled about the following appropriate screening and preventive services:    Vaccines to include Pneumoccal, Influenza, Zostavax, Tetanus  Cardiovascular Disease - explained risk factors and healthy lifestyle behaviors which included diet, exercise, tobacco use  Colorectal cancer screening - completed in 2012  Diabetes screening - N/A  Smoking cessation counseling - pt declined  Advanced directives - requested copy  General preventive health recommendations  Patient Instructions (the written plan) was given to the patient.   See attached scanned questionnaire for additional information.  Lindell Noe, LPN  02/08/2016    

## 2016-02-08 NOTE — Patient Instructions (Addendum)
Check with your insurance to see if they will cover the shingles and tetanus shot. Call about an eye and dental exam.   Start flomax and update me as needed.  Take care. Glad to see you.

## 2016-02-08 NOTE — Progress Notes (Signed)
Pre visit review using our clinic review tool, if applicable. No additional management support is needed unless otherwise documented below in the visit note. 

## 2016-02-11 DIAGNOSIS — R399 Unspecified symptoms and signs involving the genitourinary system: Secondary | ICD-10-CM | POA: Insufficient documentation

## 2016-02-11 NOTE — Assessment & Plan Note (Signed)
Continue current med.  No ADE.  Controlled.

## 2016-02-11 NOTE — Assessment & Plan Note (Signed)
Continue current med.  No ADE.  Controlled.  Rare flares, rare nsaid use.

## 2016-02-11 NOTE — Assessment & Plan Note (Signed)
Continue current med.  No ADE.  Needs work on diet and exercise for LDL and TG and weight.

## 2016-02-11 NOTE — Assessment & Plan Note (Addendum)
Continue current med.  No ADE.  Controlled.

## 2016-02-11 NOTE — Assessment & Plan Note (Signed)
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  Prostate cancer screening and PSA options (with potential risks and benefits of testing vs not testing) were discussed along with recent recs/guidelines.  Defer PSA for now.  Start flomax with routine cautions and we can consider PSA in the future if needed.  He'll update me.

## 2016-02-11 NOTE — Assessment & Plan Note (Signed)
Continue off med.  No ADE.  Controlled.  Needs weight loss.  Labs d/w pt.

## 2016-04-14 ENCOUNTER — Other Ambulatory Visit: Payer: Self-pay | Admitting: Family Medicine

## 2016-04-15 ENCOUNTER — Ambulatory Visit (INDEPENDENT_AMBULATORY_CARE_PROVIDER_SITE_OTHER): Payer: Medicare Other | Admitting: Family Medicine

## 2016-04-15 ENCOUNTER — Telehealth: Payer: Self-pay

## 2016-04-15 ENCOUNTER — Encounter: Payer: Self-pay | Admitting: Family Medicine

## 2016-04-15 VITALS — BP 132/80 | HR 84 | Temp 97.9°F | Wt 198.8 lb

## 2016-04-15 DIAGNOSIS — R12 Heartburn: Secondary | ICD-10-CM

## 2016-04-15 DIAGNOSIS — R0789 Other chest pain: Secondary | ICD-10-CM

## 2016-04-15 MED ORDER — RANITIDINE HCL 150 MG PO TABS: 150.0000 mg | ORAL_TABLET | Freq: Two times a day (BID) | ORAL | Status: DC

## 2016-04-15 NOTE — Telephone Encounter (Signed)
PLEASE NOTE: All timestamps contained within this report are represented as [place] Standard Time. CONFIDENTIALTY NOTICE: This fax transmission is intended only for the addressee. It contains information that is legally privileged, confidential or otherwise protected from use or disclosure. If you are not the intended recipient, you are strictly prohibited from reviewing, disclosing, copying using or disseminating any of this information or taking any action in reliance on or regarding this information. If you have received this fax in error, please notify us immediately by telephone so that we can arrange for its return to [place]. Phone: [phone], Toll-Free: [phone], Fax: [phone] Page: 1 of 2 Call Id: [id number] [place] Patient Name: Nathan Fernandez Gender: Male DOB: September 12, 1941 Age: 75 Y 13 M 3 D Return Phone Number: [phone] (Primary) Address: City/State/Zip: [place] Client [facility] - Client Client Site [facility] [facility] - Day Physician Renford Dills Contact Type Call Who Is Calling Patient / Member / Family / Caregiver Call Type Triage / Clinical Caller Name Leanor Kail Relationship To Patient Spouse Return Phone Number [phone] (Primary) Chief Complaint Cough Reason for Call Symptomatic / Request for [place] states that her husband has indigestion, previous heart attack patient - has been sick since he received the flu shot, is coughing. Appointment Disposition EMR Appointment Attempted - Not Scheduled Info pasted into Epic No PreDisposition Home Care Translation No Nurse Assessment Nurse: Einar Gip, RN, Neoma Laming Date/Time (Eastern Time): 04/14/2016 5:13:40 PM Confirm and document reason for call. If symptomatic, describe symptoms. You must click the next button to save text entered. ---Patient states  he has been awakened a couple time in the last few weeks with heart burn. Indigestion at the bottom of his rib cage. No current symptoms. Denies the pain is severe when it happens. Had a previous MI but this pain is different. Has the patient traveled out of the country within the last 30 days? ---No Does the patient have any new or worsening symptoms? ---Yes Will a triage be completed? ---Yes Related visit to physician within the last 2 weeks? ---No Does the PT have any chronic conditions? (i.e. diabetes, asthma, etc.) ---Yes List chronic conditions. ---Previous MI, Is this a behavioral health or substance abuse call? ---No Guidelines Guideline Title Affirmed Question Affirmed Notes Nurse Date/Time (Eastern Time) Abdominal Pain - Upper [1] MILD pain (e.g., does not interfere with normal activities) AND [2] comes Wandalee Ferdinand 04/14/2016 5:18:22 PM PLEASE NOTE: All timestamps contained within this report are represented as [place] Standard Time. CONFIDENTIALTY NOTICE: This fax transmission is intended only for the addressee. It contains information that is legally privileged, confidential or otherwise protected from use or disclosure. If you are not the intended recipient, you are strictly prohibited from reviewing, disclosing, copying using or disseminating any of this information or taking any action in reliance on or regarding this information. If you have received this fax in error, please notify us immediately by telephone so that we can arrange for its return to [place]. Phone: [phone], Toll-Free: [phone], Fax: [phone] Page: 2 of 2 Call Id: [id number] Guidelines Guideline Title Affirmed Question Affirmed Notes Nurse Date/Time Eilene Ghazi Time) and goes (cramps) AND [3] present > 72 hours Disp. Time Eilene Ghazi Time) Disposition Final User 04/14/2016 5:20:58 PM See Physician within 24 Hours Yes Daoud, RN, Garrel Ridgel Understands: Yes Disagree/Comply: Comply Care  Advice Given Per Guideline SEE PHYSICIAN WITHIN 24 HOURS:  ANTACID: If having pain now, try taking an antacid (e.g., Mylanta, Maalox). Dose: 2 tablespoons (30 ml) of liquid. ANTACIDS: Try taking an antacid (e.g., Mylanta, Maalox) 1 hour after meals and before bedtime. Dose: 2 tablespoons (30 ml) of liquid. FLUIDS: * Drink clear liquids only (e.g., water, flat soft drinks or half-strength Gatorade), small amounts at a time, until the pain is resolved for 2 hours. * Then slowly return to a regular diet. CALL BACK IF: * Severe pain present over 1 hour * Constant pain present over 2 hours * You become worse. CARE ADVICE given per Abdominal Pain, Upper (Adult) guideline. Referrals REFERRED TO PCP OFFICE

## 2016-04-15 NOTE — Progress Notes (Signed)
Pre visit review using our clinic review tool, if applicable. No additional management support is needed unless otherwise documented below in the visit note.  Started a few weeks ago.  Not every night but some nights.  Has woken up with sx in the middle of the night, discomfort.  One night he spit up some acidic material.  Had to get up to spit it out.  No chest sx during the day.  Only at night, but not every night. Zero exertional sx.  Sleeping on flat bed, on a few pillows- baseline.  No recent nsaid use.  Not vomiting blood.  No blood in stools.   H/o CAD noted.   He has been getting back on the elliptical daily w/o chest discomfort.   He stopped smoking in the meantime.    PMH and SH reviewed  ROS: See HPI, otherwise noncontributory.  Meds, vitals, and allergies reviewed.   GEN: nad, alert and oriented HEENT: mucous membranes moist NECK: supple w/o LA CV: rrr. PULM: ctab, no inc wob ABD: soft, +bs EXT: no edema SKIN: no acute rash  EKG compared to prev and w/o acute changes, compared to prev EKG.

## 2016-04-15 NOTE — Patient Instructions (Signed)
Add on zantac 150mg  twice a day and elevate the head of your bed.  Take care.  Update me as needed.  Glad to see you.

## 2016-04-15 NOTE — Telephone Encounter (Signed)
i spoke with pt this morning and he has indigestion every other night;pt scheduled appt with Dr Damita Dunnings today at 10:30; if pts condition changes or worsens prior to appt pt will cb.

## 2016-04-16 ENCOUNTER — Encounter: Payer: Self-pay | Admitting: Family Medicine

## 2016-04-16 NOTE — Assessment & Plan Note (Signed)
Not likely to be cardiac, d/w pt.  Much more likely to be GERD.  EKG w/o acute changes.  Add on zantac 150mg  twice a day and elevate the head of the bed.  Update me as needed.  Continue smoking cessation and exercise as tolerated.  He agrees.  >25 minutes spent in face to face time with patient, >50% spent in counselling or coordination of care.

## 2016-04-16 NOTE — Telephone Encounter (Signed)
See OV note.  Thanks.  

## 2016-12-26 DIAGNOSIS — J069 Acute upper respiratory infection, unspecified: Secondary | ICD-10-CM | POA: Diagnosis not present

## 2017-01-19 ENCOUNTER — Other Ambulatory Visit: Payer: Self-pay | Admitting: Family Medicine

## 2017-01-21 ENCOUNTER — Other Ambulatory Visit: Payer: Self-pay | Admitting: Family Medicine

## 2017-01-23 ENCOUNTER — Other Ambulatory Visit: Payer: Self-pay | Admitting: Family Medicine

## 2017-01-23 NOTE — Telephone Encounter (Signed)
Last filled 03-29-16 #30 Last OV 04-15-16 No future OV

## 2017-01-25 NOTE — Telephone Encounter (Signed)
Due for f/u AMW visit.  Thanks.  Sent rx.

## 2017-01-26 NOTE — Telephone Encounter (Signed)
Please call and schedule CPE with yourself and CPE with Dr. Damita Dunnings.

## 2017-02-02 ENCOUNTER — Telehealth: Payer: Self-pay

## 2017-02-02 NOTE — Telephone Encounter (Signed)
Attempted to reach pt to schedule 2018 AWV + CPE. Left message with contact info on mobile phone.

## 2017-02-03 NOTE — Telephone Encounter (Signed)
Left voicemail on home phone regarding scheduling appts.

## 2017-02-06 ENCOUNTER — Other Ambulatory Visit: Payer: Self-pay | Admitting: Family Medicine

## 2017-02-11 ENCOUNTER — Ambulatory Visit: Payer: Medicare Other

## 2017-02-16 ENCOUNTER — Other Ambulatory Visit: Payer: Self-pay | Admitting: Family Medicine

## 2017-02-16 ENCOUNTER — Ambulatory Visit (INDEPENDENT_AMBULATORY_CARE_PROVIDER_SITE_OTHER): Payer: Medicare Other

## 2017-02-16 VITALS — BP 120/78 | HR 82 | Temp 97.6°F | Ht 65.5 in | Wt 177.5 lb

## 2017-02-16 DIAGNOSIS — E119 Type 2 diabetes mellitus without complications: Secondary | ICD-10-CM

## 2017-02-16 DIAGNOSIS — Z0000 Encounter for general adult medical examination without abnormal findings: Secondary | ICD-10-CM

## 2017-02-16 LAB — COMPREHENSIVE METABOLIC PANEL
ALBUMIN: 4.2 g/dL (ref 3.5–5.2)
ALK PHOS: 53 U/L (ref 39–117)
ALT: 23 U/L (ref 0–53)
AST: 23 U/L (ref 0–37)
BUN: 21 mg/dL (ref 6–23)
CALCIUM: 9.7 mg/dL (ref 8.4–10.5)
CHLORIDE: 103 meq/L (ref 96–112)
CO2: 31 mEq/L (ref 19–32)
Creatinine, Ser: 0.81 mg/dL (ref 0.40–1.50)
GFR: 98.36 mL/min (ref >60.00)
Glucose, Bld: 113 mg/dL — ABNORMAL HIGH (ref 70–99)
POTASSIUM: 5 meq/L (ref 3.5–5.1)
SODIUM: 138 meq/L (ref 135–145)
TOTAL PROTEIN: 7.3 g/dL (ref 6.0–8.3)
Total Bilirubin: 0.5 mg/dL (ref 0.2–1.2)

## 2017-02-16 LAB — LIPID PANEL
CHOLESTEROL: 169 mg/dL (ref 0–200)
HDL: 35.4 mg/dL — ABNORMAL LOW (ref >39.00)
LDL Cholesterol: 103 mg/dL — ABNORMAL HIGH (ref 0–99)
NonHDL: 133.48
TRIGLYCERIDES: 152 mg/dL — AB (ref 0.0–149.0)
Total CHOL/HDL Ratio: 5
VLDL: 30.4 mg/dL (ref 0.0–40.0)

## 2017-02-16 LAB — HEMOGLOBIN A1C: HEMOGLOBIN A1C: 6.8 % — AB (ref 4.6–6.5)

## 2017-02-16 NOTE — Patient Instructions (Signed)
[name] , Thank you for taking time to come for your Medicare Wellness Visit. I appreciate your ongoing commitment to your health goals. Please review the following plan we discussed and let me know if I can assist you in the future.   These are the goals we discussed: Goals    . Eat more fruits and vegetables          Starting 2/17/201, I will decrease intake of starchy foods and increase intake of fresh fruits and vegetables to 5 servings day.       This is a list of the screening recommended for you and due dates:  Health Maintenance  Topic Date Due  . Complete foot exam   02/17/2017*  . Flu Shot  03/21/2017*  . Pneumonia vaccines (2 of 2 - PPSV23) 02/16/2018*  . Eye exam for diabetics  05/21/2018*  . Tetanus Vaccine  08/31/2021*  . Hemoglobin A1C  08/16/2017  *Topic was postponed. The date shown is not the original due date.   Preventive Care for Adults  A healthy lifestyle and preventive care can promote health and wellness. Preventive health guidelines for adults include the following key practices.  . A routine yearly physical is a good way to check with your health care provider about your health and preventive screening. It is a chance to share any concerns and updates on your health and to receive a thorough exam.  . Visit your dentist for a routine exam and preventive care every 6 months. Brush your teeth twice a day and floss once a day. Good oral hygiene prevents tooth decay and gum disease.  . The frequency of eye exams is based on your age, health, family medical history, use  of contact lenses, and other factors. Follow your health care provider's ecommendations for frequency of eye exams.  . Eat a healthy diet. Foods like vegetables, fruits, whole grains, low-fat dairy products, and lean protein foods contain the nutrients you need without too many calories. Decrease your intake of foods high in solid fats, added sugars, and salt. Eat the right amount of  calories for you. Get information about a proper diet from your health care provider, if necessary.  . Regular physical exercise is one of the most important things you can do for your health. Most adults should get at least 150 minutes of moderate-intensity exercise (any activity that increases your heart rate and causes you to sweat) each week. In addition, most adults need muscle-strengthening exercises on 2 or more days a week.  Silver Sneakers may be a benefit available to you. To determine eligibility, you may visit the website: www.silversneakers.com or contact program at [phone]) 224-4359 Mon-Fri between 8AM-8PM.   . Maintain a healthy weight. The body mass index (BMI) is a screening tool to identify possible weight problems. It provides an estimate of body fat based on height and weight. Your health care provider can find your BMI and can help you achieve or maintain a healthy weight.   For adults 20 years and older: ? A BMI below 18.5 is considered underweight. ? A BMI of 18.5 to 24.9 is normal. ? A BMI of 25 to 29.9 is considered overweight. ? A BMI of 30 and above is considered obese.   . Maintain normal blood lipids and cholesterol levels by exercising and minimizing your intake of saturated fat. Eat a balanced diet with plenty of fruit and vegetables. Blood tests for lipids and cholesterol should begin at age 78 and be repeated  every 5 years. If your lipid or cholesterol levels are high, you are over 50, or you are at high risk for heart disease, you may need your cholesterol levels checked more frequently. Ongoing high lipid and cholesterol levels should be treated with medicines if diet and exercise are not working.  . If you smoke, find out from your health care provider how to quit. If you do not use tobacco, please do not start.  . If you choose to drink alcohol, please do not consume more than 2 drinks per day. One drink is considered to be 12 ounces (355 mL) of beer, 5 ounces  (148 mL) of wine, or 1.5 ounces (44 mL) of liquor.  . If you are 74-53 years old, ask your health care provider if you should take aspirin to prevent strokes.  . Use sunscreen. Apply sunscreen liberally and repeatedly throughout the day. You should seek shade when your shadow is shorter than you. Protect yourself by wearing long sleeves, pants, a wide-brimmed hat, and sunglasses year round, whenever you are outdoors.  . Once a month, do a whole body skin exam, using a mirror to look at the skin on your back. Tell your health care provider of new moles, moles that have irregular borders, moles that are larger than a pencil eraser, or moles that have changed in shape or color.

## 2017-02-16 NOTE — Progress Notes (Signed)
Pre visit review using our clinic review tool, if applicable. No additional management support is needed unless otherwise documented below in the visit note. 

## 2017-02-16 NOTE — Progress Notes (Signed)
Subjective:   Nathan Fernandez is a 76 y.o. male who presents for Medicare Annual/Subsequent preventive examination.  Review of Systems:  N/A Cardiac Risk Factors include: advanced age (>10men, >31 women);male gender;diabetes mellitus;dyslipidemia;hypertension;obesity (BMI >30kg/m2);smoking/ tobacco exposure     Objective:    Vitals: BP 120/78 (BP Location: Left Arm, Patient Position: Sitting, Cuff Size: Normal)   Pulse 82   Temp 97.6 F (36.4 C) (Oral)   Ht 5' 5.5" (1.664 m) Comment: no shoes  Wt 177 lb 8 oz (80.5 kg)   SpO2 92%   BMI 29.09 kg/m   Body mass index is 29.09 kg/m.  Tobacco History  Smoking Status  . Former Smoker  . Types: Cigars  Smokeless Tobacco  . Never Used    Comment: cigar occassionally     Counseling given: No   Past Medical History:  Diagnosis Date  . 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   Hosp R facial weakness  . CAD (coronary artery disease)    MI, [place], PTCA; [place] 04/18/13: Distal left main 40%, proximal LAD with several aneurysmal segments, proximal LAD 50% prior to and after aneurysmal segments, area does not appear to flow-limiting, proximal diagonal 70%, ostial circumflex 20%, proximal circumflex 20%, OM1 30-40%, proximal RCA 40%, mid RCA 30%, distal RCA 20%, EF 50% => med Rx  . COPD (chronic obstructive pulmonary disease) ([place])   . DM2 (diabetes mellitus, type 2) ([place])   . History of ETT 1998   wnl  . History of MRI 08/04/09   brain- atrophy sm vess dz  . HLD (hyperlipidemia)   . HTN (hypertension)    Past Surgical History:  Procedure Laterality Date  . cataract surgery  09/06   repair lens which moved  . stress myoview  06/29/06   sm distal anteroseptal & apical infarct  . THYROIDECTOMY, PARTIAL  1967   B9 growth   Family History  Problem Relation Age of Onset  . Hypertension Mother   . Aneurysm Mother   . Stroke Mother   . Hypertension Father   . Heart disease Father     CAD  . Heart disease Son     MI  . Colon cancer Neg Hx    . Prostate cancer Neg Hx    History  Sexual Activity  . Sexual activity: No    Outpatient Encounter Prescriptions as of 02/16/2017  Medication Sig  . aspirin 81 MG EC tablet Take 81 mg by mouth daily.    . cyclobenzaprine (FLEXERIL) 5 MG tablet TAKE 1-2 TABLETS (5-10 MG TOTAL) BY MOUTH 3 (THREE) TIMES DAILY AS NEEDED FOR MUSCLE SPASMS.  Marland Kitchen enalapril (VASOTEC) 10 MG tablet TAKE 1 TABLET BY MOUTH DAILY  . gemfibrozil (LOPID) 600 MG tablet TAKE 1 TABLET (600 MG TOTAL) BY MOUTH 2 (TWO) TIMES DAILY.  . Ginkgo Biloba (GINKOBA) 40 MG TABS Take by mouth daily.    . Ginseng 250 MG CAPS Take by mouth daily.    . indomethacin (INDOCIN) 50 MG capsule TAKE ONE CAPSULE BY MOUTH TWICE A DAY AS NEEDED WITH A MEAL FOR GOUT  . meclizine (ANTIVERT) 12.5 MG tablet Take 1-2 tablets (12.5-25 mg total) by mouth 3 (three) times daily as needed for dizziness.  . Multiple Vitamin (MULTIVITAMIN) tablet Take 1 tablet by mouth daily.    . nitroGLYCERIN (NITROSTAT) 0.4 MG SL tablet Place one under tongue at onset of chest pain, may repeat in 5 minutes  . NON FORMULARY Reported on 02/08/2016  . NON FORMULARY Take 1  tablet by mouth daily. Super beta prostate  . Omega-3 Fatty Acids (FISH OIL) 1000 MG CAPS Take by mouth daily.    . pravastatin (PRAVACHOL) 40 MG tablet Take 1 tablet (40 mg total) by mouth at bedtime.  . ranitidine (ZANTAC) 150 MG tablet Take 1 tablet (150 mg total) by mouth 2 (two) times daily.  Marland Kitchen Specialty Vitamins Products (PROSTATE PO) Take one tab twice daily  . tamsulosin (FLOMAX) 0.4 MG CAPS capsule TAKE 1 CAPSULE (0.4 MG TOTAL) BY MOUTH DAILY.  . traZODone (DESYREL) 100 MG tablet TAKE 2 TABLETS (200 MG TOTAL) BY MOUTH AT BEDTIME.  . vitamin E 400 UNIT capsule Take 400 Units by mouth daily.    . Vitamins-Lipotropics (B-100 COMPLEX PO) Take by mouth daily.     No facility-administered encounter medications on file as of 02/16/2017.     Activities of Daily Living In your present state of health,  do you have any difficulty performing the following activities: 02/16/2017  Hearing? N  Vision? N  Difficulty concentrating or making decisions? N  Walking or climbing stairs? N  Dressing or bathing? N  Doing errands, shopping? N  Preparing Food and eating ? N  Using the Toilet? N  In the past six months, have you accidently leaked urine? N  Do you have problems with loss of bowel control? N  Managing your Medications? N  Managing your Finances? N  Housekeeping or managing your Housekeeping? N  Some recent data might be hidden    Patient Care Team: Tonia Ghent, MD as PCP - General (Family Medicine)   Assessment:     Hearing Screening   125Hz  250Hz  500Hz  1000Hz  2000Hz  3000Hz  4000Hz  6000Hz  8000Hz   Right ear:   0 0 0  0    Left ear:   40 40 40  0      Visual Acuity Screening   Right eye Left eye Both eyes  Without correction: 20/70 20/30 20/30   With correction:       Exercise Activities and Dietary recommendations Current Exercise Habits: The patient does not participate in regular exercise at present (pt walks on job), Exercise limited by: None identified  Goals    . Eat more fruits and vegetables          Starting 2/17/201, I will decrease intake of starchy foods and increase intake of fresh fruits and vegetables to 5 servings day.      Fall Risk Fall Risk  02/16/2017 02/08/2016 04/17/2014  Falls in the past year? No No No   Depression Screen PHQ 2/9 Scores 02/16/2017 02/08/2016 04/17/2014  PHQ - 2 Score 0 0 2    Cognitive Function MMSE - Mini Mental State Exam 02/16/2017 02/08/2016  Orientation to time 5 5  Orientation to Place 5 5  Registration 3 3  Attention/ Calculation 0 5  Recall 1 3  Recall-comments pt was unable to recall 2 of 3 words -  Language- name 2 objects 0 -  Language- repeat 1 1  Language- follow 3 step command 3 3  Language- read & follow direction 0 1  Write a sentence 0 -  Copy design 0 -  Total score 18 -     PLEASE NOTE: A Mini-Cog  screen was completed. Maximum score is 20. A value of 0 denotes this part of Folstein MMSE was not completed or the patient failed this part of the Mini-Cog screening.   Mini-Cog Screening Orientation to Time - Max 5 pts Orientation to Place -  Max 5 pts Registration - Max 3 pts Recall - Max 3 pts Language Repeat - Max 1 pts Language Follow 3 Step Command - Max 3 pts     Immunization History  Administered Date(s) Administered  . Influenza Split 01/07/2012  . Influenza,inj,Quad PF,36+ Mos 02/08/2016  . Pneumococcal Conjugate-13 02/08/2016  . Td 09/01/2001   Screening Tests Health Maintenance  Topic Date Due  . FOOT EXAM  02/17/2017 (Originally 02/07/2017)  . INFLUENZA VACCINE  03/21/2017 (Originally 07/22/2016)  . PNA vac Low Risk Adult (2 of 2 - PPSV23) 02/16/2018 (Originally 02/07/2017)  . OPHTHALMOLOGY EXAM  05/21/2018 (Originally 05/22/2016)  . TETANUS/TDAP  08/31/2021 (Originally 09/02/2011)  . HEMOGLOBIN A1C  08/16/2017      Plan:     I have personally reviewed and addressed the Medicare Annual Wellness questionnaire and have noted the following in the patient's chart:  A. Medical and social history B. Use of alcohol, tobacco or illicit drugs  C. Current medications and supplements D. Functional ability and status E.  Nutritional status F.  Physical activity G. Advance directives H. List of other physicians I.  Hospitalizations, surgeries, and ER visits in previous 12 months J.  Deaf [place] to include hearing, vision, cognitive, depression L. Referrals and appointments - none  In addition, I have reviewed and discussed with patient certain preventive protocols, quality metrics, and best practice recommendations. A written personalized care plan for preventive services as well as general preventive health recommendations were provided to patient.  See attached scanned questionnaire for additional information.   Signed,   Lindell Noe, MHA, BS, LPN [facility]

## 2017-02-16 NOTE — Progress Notes (Addendum)
PCP notes:   Health maintenance:  Flu vaccine - pt declined PNA vaccine - pt declined Tetanus vaccine - pt declined Foot exam - PCP will complete at next appt Eye exam - addressed A1C - completed  Abnormal screenings:   Hearing - failed Mini-Cog score: 18/20  Patient concerns:   Back pain - pt reports intermittent left lower back pain Vertigo - pt reports continued concerns with vertigo  Nurse concerns:  None  Next PCP appt:   02/17/17 @ 1215  I reviewed health advisor's note, was available for consultation on the day of service listed in this note, and agree with documentation and plan. Elsie Stain, MD.

## 2017-02-16 NOTE — Telephone Encounter (Signed)
AWV and CPE scheduled.

## 2017-02-17 ENCOUNTER — Ambulatory Visit (INDEPENDENT_AMBULATORY_CARE_PROVIDER_SITE_OTHER): Payer: Medicare Other | Admitting: Family Medicine

## 2017-02-17 ENCOUNTER — Encounter: Payer: Self-pay | Admitting: Family Medicine

## 2017-02-17 DIAGNOSIS — I10 Essential (primary) hypertension: Secondary | ICD-10-CM

## 2017-02-17 DIAGNOSIS — M545 Low back pain: Secondary | ICD-10-CM

## 2017-02-17 DIAGNOSIS — N138 Other obstructive and reflux uropathy: Secondary | ICD-10-CM

## 2017-02-17 DIAGNOSIS — G4700 Insomnia, unspecified: Secondary | ICD-10-CM | POA: Diagnosis not present

## 2017-02-17 DIAGNOSIS — I2510 Atherosclerotic heart disease of native coronary artery without angina pectoris: Secondary | ICD-10-CM

## 2017-02-17 DIAGNOSIS — M109 Gout, unspecified: Secondary | ICD-10-CM | POA: Diagnosis not present

## 2017-02-17 DIAGNOSIS — E7800 Pure hypercholesterolemia, unspecified: Secondary | ICD-10-CM | POA: Diagnosis not present

## 2017-02-17 DIAGNOSIS — N401 Enlarged prostate with lower urinary tract symptoms: Secondary | ICD-10-CM | POA: Diagnosis not present

## 2017-02-17 DIAGNOSIS — E119 Type 2 diabetes mellitus without complications: Secondary | ICD-10-CM | POA: Diagnosis not present

## 2017-02-17 DIAGNOSIS — Z0000 Encounter for general adult medical examination without abnormal findings: Secondary | ICD-10-CM

## 2017-02-17 MED ORDER — PRAVASTATIN SODIUM 40 MG PO TABS
40.0000 mg | ORAL_TABLET | Freq: Every day | ORAL | 3 refills | Status: DC
Start: 2017-02-17 — End: 2017-12-29

## 2017-02-17 MED ORDER — CYCLOBENZAPRINE HCL 5 MG PO TABS: 5.0000 mg | ORAL_TABLET | Freq: Three times a day (TID) | ORAL | 1 refills | Status: DC | PRN

## 2017-02-17 MED ORDER — TAMSULOSIN HCL 0.4 MG PO CAPS: 0.4000 mg | ORAL_CAPSULE | Freq: Every day | ORAL | 3 refills | Status: DC

## 2017-02-17 MED ORDER — GEMFIBROZIL 600 MG PO TABS: 600.0000 mg | ORAL_TABLET | Freq: Two times a day (BID) | ORAL | 3 refills | Status: DC

## 2017-02-17 MED ORDER — TRAZODONE HCL 100 MG PO TABS: 200.0000 mg | ORAL_TABLET | Freq: Every day | ORAL | 3 refills | Status: DC

## 2017-02-17 MED ORDER — ENALAPRIL MALEATE 5 MG PO TABS: 5.0000 mg | ORAL_TABLET | Freq: Every day | ORAL | 3 refills | Status: DC

## 2017-02-17 MED ORDER — NITROGLYCERIN 0.4 MG SL SUBL: SUBLINGUAL_TABLET | SUBLINGUAL | 12 refills | Status: DC

## 2017-02-17 NOTE — Progress Notes (Signed)
Pre visit review using our clinic review tool, if applicable. No additional management support is needed unless otherwise documented below in the visit note. 

## 2017-02-17 NOTE — Patient Instructions (Signed)
Cut enalapril back to 5mg .  If still lightheaded, then cut back to 2.5mg  a day.  Restart pravastatin.  Take care.  Glad to see you.  Update me as needed.  Recheck in about 6 months or when you get back into town.

## 2017-02-17 NOTE — Progress Notes (Signed)
Health maintenance:  Flu vaccine - pt declined PNA vaccine - pt declined Tetanus vaccine - pt declined Foot exam - done today Eye exam - addressed A1C - completed  Abnormal screenings:   Hearing - failed, declined hearing aids.   Mini-Cog score: 18/20 on initial check.  Recheck done today at office visit. 3 out of 3 on recall. Alert and oriented. Able to do math. 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 He and his wife agree.  Patient concerns, discussed at office visit-  Back pain - pt reports intermittent left lower back pain, occ pain, he is able to tolerate.  occ prn otc med use with relief.   Lightheaded, not vertigo - pt reports continued concerns with vertigo.  occ sx.  worse in the AM, when getting out of bed.   Weight loss noted. Intentional, with diet changes. ================================================= Routine vaccinations d/w pt.  Declined.   Colonoscopy due, d/w pt.  He can notify GI when willing to go.  He is going to work in [place] for a few months.  D/w pt.    Wife designated if patient were incapacitated.    Diabetes:  No meds Hypoglycemic episodes: no sx Hyperglycemic episodes: no sx Feet problems: no Blood Sugars averaging: not checked.   eye exam within last year:  Needs to schedule, d/w pt.    HLD.  Had been off pravastatin recently.  Still on gemfibrozil.  No ADE on med.    Gout.  No sx recently.    Insomnia treated with trazodone.  No ADE.  Sleeping okay now.    No NTG use.    LUTS.  Stream is slower than he would like it to be.  Worse off flomax.  Able to tolerate medication.  PMH and SH reviewed  Meds, vitals, and allergies reviewed.   ROS: Per HPI unless specifically indicated in ROS section   GEN: nad, alert and oriented HEENT: mucous membranes moist NECK:  supple w/o LA CV: rrr. PULM: ctab, no inc wob ABD: soft, +bs EXT: no edema SKIN: no acute rash  Diabetic foot exam: Normal inspection No skin breakdown No calluses  Normal DP pulses Normal sensation to light touch and monofilament 1st nails thickened B

## 2017-02-18 DIAGNOSIS — M545 Low back pain, unspecified: Secondary | ICD-10-CM | POA: Insufficient documentation

## 2017-02-18 NOTE — Assessment & Plan Note (Signed)
Restart statin. Discussed patient. He agrees.

## 2017-02-18 NOTE — Assessment & Plan Note (Signed)
Continue stretching with when necessary use of Flexeril with routine cautions.

## 2017-02-18 NOTE — Assessment & Plan Note (Signed)
Continue Flomax. LUTS improved with Flomax. Able tolerate medication. Prostate cancer screening and PSA options (with potential risks and benefits of testing vs not testing) were discussed along with recent recs/guidelines.  He declined testing PSA at this point.

## 2017-02-18 NOTE — Assessment & Plan Note (Signed)
Likely overtreated, making him lightheaded episodically. Cut enalapril to 5 mg a day. If still lightheaded then cut back to 2.5 mg. Discussed with patient. He agrees.

## 2017-02-18 NOTE — Assessment & Plan Note (Signed)
No recent flares. Continue as is.

## 2017-02-18 NOTE — Assessment & Plan Note (Signed)
Continue trazodone at night. No adverse effect. Sleeping well on medication.

## 2017-02-18 NOTE — Assessment & Plan Note (Signed)
No chest pain. No nitroglycerin use. Restart statin. Discussed smoking cessation.

## 2017-02-18 NOTE — Assessment & Plan Note (Signed)
See above. Vaccination declined. I see no evidence of a true pathologic memory loss.

## 2017-02-18 NOTE — Assessment & Plan Note (Signed)
A1c at goal. Discussed with patient. Recheck periodically. No new medications.

## 2017-03-07 ENCOUNTER — Other Ambulatory Visit: Payer: Self-pay | Admitting: Family Medicine

## 2017-03-09 NOTE — Telephone Encounter (Signed)
Left message on voicemail for patient to call back. Script was printed for patient at last office visit 02/17/17.

## 2017-03-10 ENCOUNTER — Telehealth: Payer: Self-pay

## 2017-03-10 NOTE — Telephone Encounter (Signed)
Nathan Fernandez left v/m; pt was seen 02/17/17 and discussed stopping smoking but at that time pt did not want to quit; now pt wants to stop smoking and request med to assist pt to stop smoking to [place].

## 2017-03-11 MED ORDER — VARENICLINE TARTRATE 0.5 MG X 11 & 1 MG X 42 PO MISC: ORAL | 0 refills | Status: DC

## 2017-03-11 MED ORDER — VARENICLINE TARTRATE 1 MG PO TABS: 1.0000 mg | ORAL_TABLET | Freq: Two times a day (BID) | ORAL | 1 refills | Status: DC

## 2017-03-11 NOTE — Telephone Encounter (Signed)
[facility] rx sent.  Start with starter pack for 1 month, try to quit smoking about 1 week into treatment.  When done with starter pack then use continuing pack for months #2 and #3.  If any mood changes or abnormal dreams, then stop med and update me.  Thanks.

## 2017-03-11 NOTE — Telephone Encounter (Signed)
Pt's wife was notified, [name] verbalized understanding.

## 2017-05-06 ENCOUNTER — Other Ambulatory Visit: Payer: Self-pay | Admitting: Family Medicine

## 2017-05-06 NOTE — Telephone Encounter (Signed)
Last office visit 02/17/2017.  Last refilled 02/17/2017 for #90 with 1 refill.  Ok to refill?

## 2017-05-06 NOTE — Telephone Encounter (Signed)
Sent. Thanks.   

## 2017-05-07 ENCOUNTER — Other Ambulatory Visit: Payer: Self-pay | Admitting: Family Medicine

## 2017-06-20 ENCOUNTER — Other Ambulatory Visit: Payer: Self-pay | Admitting: Family Medicine

## 2017-08-18 ENCOUNTER — Telehealth: Payer: Self-pay

## 2017-08-18 NOTE — Telephone Encounter (Signed)
Nathan Fernandez left v/m; pt is presently working in [place] and pt has a cold with cough; request tessalon perle to be faxed to [facility] in [place] fax # [phone].Please advise. Last annual exam on 02/17/17. Nathan Fernandez request cb when med sent to pharmacy.

## 2017-08-19 MED ORDER — BENZONATATE 200 MG PO CAPS: 200.0000 mg | ORAL_CAPSULE | Freq: Three times a day (TID) | ORAL | 1 refills | Status: DC | PRN

## 2017-08-19 NOTE — Telephone Encounter (Signed)
Patient's wife [name] (on [place]) notified by telephone that script has been sent to the pharmacy and verbalized understanding.

## 2017-08-19 NOTE — Telephone Encounter (Signed)
Sent.  Thanks.  See below.

## 2017-12-29 ENCOUNTER — Other Ambulatory Visit: Payer: Self-pay

## 2017-12-29 ENCOUNTER — Emergency Department (HOSPITAL_COMMUNITY): Payer: Medicare Other

## 2017-12-29 ENCOUNTER — Encounter (HOSPITAL_COMMUNITY): Payer: Self-pay | Admitting: Emergency Medicine

## 2017-12-29 ENCOUNTER — Ambulatory Visit: Payer: Medicare Other | Admitting: Family Medicine

## 2017-12-29 ENCOUNTER — Encounter: Payer: Self-pay | Admitting: Family Medicine

## 2017-12-29 DIAGNOSIS — F17290 Nicotine dependence, other tobacco product, uncomplicated: Secondary | ICD-10-CM | POA: Diagnosis not present

## 2017-12-29 DIAGNOSIS — E119 Type 2 diabetes mellitus without complications: Secondary | ICD-10-CM | POA: Insufficient documentation

## 2017-12-29 DIAGNOSIS — J449 Chronic obstructive pulmonary disease, unspecified: Secondary | ICD-10-CM | POA: Diagnosis not present

## 2017-12-29 DIAGNOSIS — R0902 Hypoxemia: Secondary | ICD-10-CM | POA: Insufficient documentation

## 2017-12-29 DIAGNOSIS — I2510 Atherosclerotic heart disease of native coronary artery without angina pectoris: Secondary | ICD-10-CM | POA: Diagnosis not present

## 2017-12-29 DIAGNOSIS — R062 Wheezing: Secondary | ICD-10-CM | POA: Diagnosis not present

## 2017-12-29 DIAGNOSIS — Z79899 Other long term (current) drug therapy: Secondary | ICD-10-CM | POA: Diagnosis not present

## 2017-12-29 DIAGNOSIS — R0602 Shortness of breath: Secondary | ICD-10-CM | POA: Diagnosis not present

## 2017-12-29 DIAGNOSIS — Z7982 Long term (current) use of aspirin: Secondary | ICD-10-CM | POA: Insufficient documentation

## 2017-12-29 DIAGNOSIS — M25531 Pain in right wrist: Secondary | ICD-10-CM | POA: Diagnosis not present

## 2017-12-29 DIAGNOSIS — I10 Essential (primary) hypertension: Secondary | ICD-10-CM | POA: Insufficient documentation

## 2017-12-29 DIAGNOSIS — M25539 Pain in unspecified wrist: Secondary | ICD-10-CM | POA: Insufficient documentation

## 2017-12-29 DIAGNOSIS — R0989 Other specified symptoms and signs involving the circulatory and respiratory systems: Secondary | ICD-10-CM | POA: Insufficient documentation

## 2017-12-29 LAB — BASIC METABOLIC PANEL
Anion gap: 8 (ref 5–15)
BUN: 24 mg/dL — AB (ref 6–20)
CHLORIDE: 101 mmol/L (ref 101–111)
CO2: 29 mmol/L (ref 22–32)
Calcium: 9.8 mg/dL (ref 8.9–10.3)
Creatinine, Ser: 0.9 mg/dL (ref 0.61–1.24)
GFR calc Af Amer: >60 mL/min (ref >60)
GFR calc non Af Amer: >60 mL/min (ref >60)
Glucose, Bld: 202 mg/dL — ABNORMAL HIGH (ref 65–99)
POTASSIUM: 4.8 mmol/L (ref 3.5–5.1)
SODIUM: 138 mmol/L (ref 135–145)

## 2017-12-29 LAB — CBC
HEMATOCRIT: 45.3 % (ref 39.0–52.0)
HEMOGLOBIN: 15.6 g/dL (ref 13.0–17.0)
MCH: 30.7 pg (ref 26.0–34.0)
MCHC: 34.4 g/dL (ref 30.0–36.0)
MCV: 89.2 fL (ref 78.0–100.0)
Platelets: 200 10*3/uL (ref 150–400)
RBC: 5.08 MIL/uL (ref 4.22–5.81)
RDW: 12.3 % (ref 11.5–15.5)
WBC: 7.2 10*3/uL (ref 4.0–10.5)

## 2017-12-29 LAB — I-STAT TROPONIN, ED: TROPONIN I, POC: 0 ng/mL (ref 0.00–0.08)

## 2017-12-29 NOTE — Assessment & Plan Note (Addendum)
Concern for PNA vs COPD exacerbation.  Clearly with rhonchi on the L>R side, with low stats at rest, worse with walking short distance, improved sats with O2 at 3L.  Pt declined EMS transport.  Wife to take patient to ER [facility].  I called front desk RN re: pending arrival, she voiced understanding about incoming patient.   >25 minutes spent in face to face time with patient, >50% spent in counselling or coordination of care.

## 2017-12-29 NOTE — Progress Notes (Signed)
He had quit smoking cigars in the meantime.    Off pravastatin w/o clear ADE on med prev.  He can restart this later on but other issues took precedent.     He came in for arm pain but had low pulse ox, down to 88%.   88% at rest pulse ~95.  Walking with sats down to 82% with pulse 112 94-95% on 3L, pulse improved to 88bpm.   He noted worsening SOB in the last few months.  No CP.  His exercise capacity is much lower now, can't walk as far w/o getting winded.  Respiratory sx worse in the last week.  More cough, congestion.  Chest wall sore from coughing.  No fevers known but felt hot.  Sputum has been yellowish, more than normal.    He had lip and facial swelling that responded to benadryl.  Has been on enalapril chronically.  D/w pt about stopping ACE.  No swelling at time of exam.   Started with L shoulder pain about 2 months ago, then had L wrist pain.  The L shoulder pain got some better but then started in the R shoulder. Now pain in the R wrist.  Pain with picking up a coffee pot, twisting a jar lid.  Pain limiting R grip.  Locally puffy in the R wrist on the flexor side.  No trauma.    Meds, vitals, and allergies reviewed.   ROS: Per HPI unless specifically indicated in ROS section   nad ncat Speaking in complete sentences OP wnl, mmm, no lip swelling.   Neck supple, no LA rrr Wheeze and rhonchi on L>R on exam.   Pain limiting R grip.  Locally puffy in the R wrist on the flexor side, locally mildly tender but finklestein neg R thumb.

## 2017-12-29 NOTE — Patient Instructions (Addendum)
Stop enalapril due to the lip swelling.  Go straight to [facility] ER.  I'll call ahead.   Take care.  Glad to see you.

## 2017-12-29 NOTE — ED Triage Notes (Signed)
Pt sent from PCP for shortness of breath with exertion and decreased SpO2. Pt's wife reports that pt has had a "croupy" cough x 1 week. Denies chest pain. Hx smoking.

## 2017-12-29 NOTE — ED Notes (Signed)
Lab work, radiology results and vital signs reviewed, no critical results at this time, no change in acuity indicated.  

## 2017-12-29 NOTE — Assessment & Plan Note (Addendum)
Likely from tendonitis vs OA.  No trauma.  We can address R wrist pain later on, if not improved with OTC brace.

## 2017-12-29 NOTE — ED Provider Notes (Signed)
Patient placed in Quick Look pathway, seen and evaluated for chief complaint of shortness of breath and hypoxia today. Sent by PCP.  Pertinent H&P findings include hypoxia with exertion. No CP. No N/V. No diaphoresis. Cough/URI symptoms x 2 weeks. Subjective fevers..  Based on initial evaluation, labs are indicated and radiology studies are indicated.  Patient counseled on process, plan, and necessity for staying for completing the evaluation.    Shary Decamp, PA-C 12/29/17 1658    Pattricia Boss, MD 12/30/17 [phone]

## 2017-12-30 ENCOUNTER — Emergency Department (HOSPITAL_COMMUNITY)
Admission: EM | Admit: 2017-12-30 | Discharge: 2017-12-30 | Disposition: A | Discharge status: Home / Self Care | Payer: Medicare Other | Attending: Emergency Medicine | Admitting: Emergency Medicine

## 2017-12-30 DIAGNOSIS — R0989 Other specified symptoms and signs involving the circulatory and respiratory systems: Secondary | ICD-10-CM

## 2017-12-30 MED ORDER — ALBUTEROL SULFATE HFA 108 (90 BASE) MCG/ACT IN AERS
2.0000 | INHALATION_SPRAY | RESPIRATORY_TRACT | Status: DC | PRN
  Administered 2017-12-30: 2 via RESPIRATORY_TRACT
  Filled 2017-12-30: qty 6.7

## 2017-12-30 MED ORDER — CETIRIZINE HCL 10 MG PO TABS: 10.0000 mg | ORAL_TABLET | Freq: Every day | ORAL | 1 refills | Status: DC

## 2017-12-30 MED ORDER — AZITHROMYCIN 250 MG PO TABS: 250.0000 mg | ORAL_TABLET | Freq: Every day | ORAL | 0 refills | Status: DC

## 2017-12-30 MED ORDER — IPRATROPIUM-ALBUTEROL 0.5-2.5 (3) MG/3ML IN SOLN
3.0000 mL | Freq: Once | RESPIRATORY_TRACT | Status: AC
  Administered 2017-12-30: 3 mL via RESPIRATORY_TRACT
  Filled 2017-12-30: qty 3

## 2017-12-30 NOTE — ED Provider Notes (Signed)
[facility] EMERGENCY DEPARTMENT Provider Note   CSN: [id number] Arrival date & time: 12/29/17  1639     History   Chief Complaint Chief Complaint  Patient presents with  . Shortness of Breath    HPI Nathan Fernandez is a 77 y.o. male.  Patient presents to the ED with a chief complaint of cough.  He states that he went to see his PCP for a cough today.  He was referred to the ER for hypoxia at 88% on RA.  He states that he normally is 91-93%.  He was a lifetime smoker, but just quit several months ago.  He reports productive cough, but denies fever.  He denies any chest pain, SOB, or radiating pain.  He states that he just feels congested.   The history is provided by the patient. No language interpreter was used.    Past Medical History:  Diagnosis Date  . 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   Hosp R facial weakness  . CAD (coronary artery disease)    MI, [place], PTCA; [place] 04/18/13: Distal left main 40%, proximal LAD with several aneurysmal segments, proximal LAD 50% prior to and after aneurysmal segments, area does not appear to flow-limiting, proximal diagonal 70%, ostial circumflex 20%, proximal circumflex 20%, OM1 30-40%, proximal RCA 40%, mid RCA 30%, distal RCA 20%, EF 50% => med Rx  . COPD (chronic obstructive pulmonary disease) ([place])   . DM2 (diabetes mellitus, type 2) ([place])   . History of ETT 1998   wnl  . History of MRI 08/04/09   brain- atrophy sm vess dz  . HLD (hyperlipidemia)   . HTN (hypertension)     Patient Active Problem List   Diagnosis Date Noted  . Hypoxia 12/29/2017  . Wrist pain 12/29/2017  . Lower back pain 02/18/2017  . Lower urinary tract symptoms (LUTS) 02/11/2016  . Bilateral knee pain 05/25/2015  . Benign paroxysmal positional vertigo 02/18/2015  . Abdominal wall symptom 02/18/2015  . Gout 05/04/2014  . Healthcare maintenance 04/18/2014  . COPD (chronic obstructive pulmonary disease) ([place]) 03/25/2013  . CAD (coronary artery  disease) 03/03/2013  . Other malaise and fatigue 02/22/2013  . Chest wall pain 05/31/2012  . Heartburn 05/07/2011  . DEPRESSION, MILD 11/28/2010  . 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 Diabetes mellitus ([place]) 03/18/2010  . 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 HYPERCHOLESTEROLEMIA 10/19/2007  . 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 HYPERTENSION, BENIGN 10/19/2007  . ROSACEA 10/19/2007  . DEGENERATIVE JOINT DISEASE 10/19/2007  . Insomnia 10/19/2007    Past Surgical History:  Procedure Laterality Date  . cataract surgery  09/06   repair lens which moved  . stress myoview  06/29/06   sm distal anteroseptal & apical infarct  . THYROIDECTOMY, PARTIAL  1967   B9 growth       Home Medications    Prior to Admission medications   Medication Sig Start Date End Date Taking? Authorizing Provider  aspirin 81 MG EC tablet Take 81 mg by mouth daily.     Yes [provider]  COD LIVER OIL PO Take 1 capsule by mouth daily.    Yes [provider]  cyclobenzaprine (FLEXERIL) 5 MG tablet TAKE 1-2 TABLETS BY MOUTH 3 TIMES DAILY AS NEEDED FOR MUSCLE SPASMS Patient taking differently: Take 5-10 mg by mouth three times a day as needed for muscle spasms 05/06/17  Yes Tonia Ghent, MD  Flaxseed, Linseed, (FLAX SEED OIL PO)  Take 1 capsule by mouth daily.    Yes [provider]  Garlic 1448 MG CAPS Take 1,000 mg by mouth daily.    Yes [provider]  gemfibrozil (LOPID) 600 MG tablet Take 1 tablet (600 mg total) by mouth 2 (two) times daily. 02/17/17  Yes Tonia Ghent, MD  Ginkgo Biloba (GINKOBA) 40 MG TABS Take 40 mg by mouth daily.    Yes [provider]  Ginseng 250 MG CAPS Take 250 mg by mouth daily.    Yes [provider]  ibuprofen (ADVIL,MOTRIN) 200 MG tablet Take 400 mg by mouth every 6 (six) hours as needed (for pain).   Yes [provider]  indomethacin (INDOCIN) 50 MG  capsule TAKE ONE CAPSULE BY MOUTH TWICE A DAY AS NEEDED WITH A MEAL FOR GOUT Patient taking differently: Take 50 mg by mouth 2 (two) times daily as needed (for gout/take with food).  12/11/15  Yes Tonia Ghent, MD  meclizine (ANTIVERT) 12.5 MG tablet Take 1-2 tablets (12.5-25 mg total) by mouth 3 (three) times daily as needed for dizziness. 02/16/15  Yes Tonia Ghent, MD  Multiple Vitamin (MULTIVITAMIN) tablet Take 1 tablet by mouth daily.     Yes [provider]  nitroGLYCERIN (NITROSTAT) 0.4 MG SL tablet Place one under tongue at onset of chest pain, may repeat in 5 minutes Patient taking differently: Place 0.4 mg under the tongue See admin instructions. 0.4 mg sublingually at onset of chest pain; may repeat in 5 minutes if no relief 02/17/17  Yes Tonia Ghent, MD  NON FORMULARY Super Beta Prostate caplets: Take 1-2 caplets by mouth daily   Yes [provider]  Omega-3 Fatty Acids (FISH OIL) 1000 MG CAPS Take 1,000 mg by mouth daily.    Yes [provider]  ranitidine (ZANTAC) 150 MG tablet Take 1 tablet (150 mg total) by mouth 2 (two) times daily. 04/15/16  Yes Tonia Ghent, MD  tamsulosin (FLOMAX) 0.4 MG CAPS capsule Take 1 capsule (0.4 mg total) by mouth daily. 02/17/17  Yes Tonia Ghent, MD  traZODone (DESYREL) 100 MG tablet Take 2 tablets (200 mg total) by mouth at bedtime. 02/17/17  Yes Tonia Ghent, MD  vitamin E 400 UNIT capsule Take 800 Units by mouth daily.    Yes [provider]  Vitamins-Lipotropics (B-100 COMPLEX PO) Take 1 tablet by mouth daily.    Yes [provider]  benzonatate (TESSALON) 200 MG capsule Take 1 capsule (200 mg total) by mouth 3 (three) times daily as needed for cough. Patient not taking: Reported on 12/29/2017 08/19/17   Tonia Ghent, MD    Family History Family History  Problem Relation Age of Onset  . Hypertension Mother   . Aneurysm Mother   . Stroke Mother   . Hypertension Father   . Heart  disease Father        CAD  . Heart disease Son        MI  . Colon cancer Neg Hx   . Prostate cancer Neg Hx     Social History Social History   Tobacco Use  . Smoking status: Former Smoker    Types: Cigars  . Smokeless tobacco: Never Used  . Tobacco comment: cigar occassionally  Substance Use Topics  . Alcohol use: Yes    Alcohol/week: 0.0 oz    Comment: very rare beer  . Drug use: No     Allergies   Ace inhibitors; Angiotensin  receptor blockers; and Enalapril   Review of Systems Review of Systems  All other systems reviewed and are negative.    Physical Exam Updated Vital Signs BP (!) 146/95 (BP Location: Right Arm)   Pulse 78   Temp 98.1 F (36.7 C) (Oral)   Resp 18   Ht 5\' 9"  (1.753 m)   Wt 85.7 kg (189 lb)   SpO2 92%   BMI 27.91 kg/m   Physical Exam  Constitutional: He is oriented to person, place, and time. He appears well-developed and well-nourished.  HENT:  Head: Normocephalic and atraumatic.  Eyes: Conjunctivae and EOM are normal. Pupils are equal, round, and reactive to light. Right eye exhibits no discharge. Left eye exhibits no discharge. No scleral icterus.  Neck: Normal range of motion. Neck supple. No JVD present.  Cardiovascular: Normal rate, regular rhythm and normal heart sounds. Exam reveals no gallop and no friction rub.  No murmur heard. Pulmonary/Chest: Effort normal. No respiratory distress. He has wheezes. He has no rales. He exhibits no tenderness.  Abdominal: Soft. He exhibits no distension and no mass. There is no tenderness. There is no rebound and no guarding.  Musculoskeletal: Normal range of motion. He exhibits no edema or tenderness.  Neurological: He is alert and oriented to person, place, and time.  Skin: Skin is warm and dry.  Psychiatric: He has a normal mood and affect. His behavior is normal. Judgment and thought content normal.  Nursing note and vitals reviewed.    ED Treatments / Results  Labs (all labs ordered  are listed, but only abnormal results are displayed) Labs Reviewed  BASIC METABOLIC PANEL - Abnormal; Notable for the following components:      Result Value   Glucose, Bld 202 (*)    BUN 24 (*)    All other components within normal limits  CBC  I-STAT TROPONIN, ED    EKG  EKG Interpretation None       Radiology Dg Chest 2 View  Result Date: 12/29/2017 CLINICAL DATA:  Shortness of breath on exertion and decreased oxygen saturation. EXAM: CHEST  2 VIEW COMPARISON:  PA and lateral chest 03/29/2013.  CT chest 04/04/2013. FINDINGS: The lungs are emphysematous with mild bibasilar atelectasis. No consolidative process, pneumothorax or effusion. Heart size is normal. No focal bony abnormality. 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 Electronically Signed   By: Inge Rise M.D.   On: 12/29/2017 18:17   Dg Wrist Complete Right  Result Date: 12/29/2017 CLINICAL DATA:  Wrist pain since yesterday with no known injury. EXAM: RIGHT WRIST - COMPLETE 3+ VIEW COMPARISON:  None. FINDINGS: Negative for fracture or dislocation.  No bony erosion. 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 Mild spurring about the carpus. There is an ossicle or chronic calcification posterior to the intercarpal row. 3 mm foreign body between the thumb and index metacarpals, chronic by history. IMPRESSION: 1. No acute finding. 2. 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 3. 3 mm foreign body between the thumb and index metacarpals. Electronically Signed   By: Monte Fantasia M.D.   On: 12/29/2017 18:20    Procedures Procedures (including critical care time)  Medications Ordered in ED Medications  ipratropium-albuterol (DUONEB) 0.5-2.5 (3) MG/3ML nebulizer solution 3 mL (not administered)     Initial Impression / Assessment and Plan / ED Course  I have reviewed the triage vital signs and the nursing notes.  Pertinent labs & imaging results that were available during my care of the patient  were reviewed  by me and considered in my medical decision making (see chart for details).     Patient with cough and wheezing.  Has been congested.  Quit smoking a few months ago.  Was found to by hypoxic to 88% on RA at PCP office.  Patient reports that he feels fine, just congested.  Denies CP or SOB at rest.  States that he gets winded if he walks long distances, but that this is not unusual.  He has some wheezing on exam.  Will give breathing treatment.    Discussed with Dr. Venora Maples, who agrees with plan for discharge.  Will cover with antibiotics.  Will give inhaler.  Final Clinical Impressions(s) / ED Diagnoses   Final diagnoses:  Chest congestion    ED Discharge Orders        Ordered    azithromycin (ZITHROMAX) 250 MG tablet  Daily     12/30/17 0325    cetirizine (ZYRTEC ALLERGY) 10 MG tablet  Daily     12/30/17 0325       Montine Circle, PA-C 12/30/17 0326    Jola Schmidt, MD 12/30/17 [phone]

## 2018-01-04 ENCOUNTER — Ambulatory Visit: Payer: Medicare Other | Admitting: Family Medicine

## 2018-01-04 ENCOUNTER — Encounter: Payer: Self-pay | Admitting: Family Medicine

## 2018-01-04 VITALS — BP 142/80 | HR 85 | Temp 97.7°F | Wt 193.2 lb

## 2018-01-04 DIAGNOSIS — M25519 Pain in unspecified shoulder: Secondary | ICD-10-CM | POA: Diagnosis not present

## 2018-01-04 DIAGNOSIS — J449 Chronic obstructive pulmonary disease, unspecified: Secondary | ICD-10-CM | POA: Diagnosis not present

## 2018-01-04 DIAGNOSIS — M25531 Pain in right wrist: Secondary | ICD-10-CM | POA: Diagnosis not present

## 2018-01-04 NOTE — Progress Notes (Signed)
ER f/u.  Still with cough and congestion but better in the meantime.  Med list updated.  Using SABA prn in the meantime usually ~1x per day.  Less sputum than prev.  Breathing is improved.  No fevers.    He continues to have right wrist and thumb pain.  Previous imaging discussed with patient.  Osteoarthritic changes noted.  He is taking ibuprofen as needed for pain, usually 2 of the 200 mg tablets daily as needed.  He continues to have bilateral shoulder pain.  Right shoulder with pain on internal rotation.  No acute injury.  Similar symptoms on the left side.  PMH and SH reviewed  ROS: Per HPI unless specifically indicated in ROS section   Meds, vitals, and allergies reviewed.   His color looks better today, his wife agrees.   nad ncat Mmm Neck supple, no LA rrr Ctab, no wheeze noted, no focal dec in BS abd soft, normal BS R shoulder with pain on int>ext rotation.  No arm drop.  Pain with abduction.  Pain and weakness on supraspinatus testing.  AC joint nontender.  Distally neurovascularly intact.

## 2018-01-04 NOTE — Patient Instructions (Addendum)
Get a flu shot at some point.  Use the shoulder exercises.  Update me if you continue to need albuterol more than a few times a week.  Recheck at a physical later in the spring with labs ahead of time.  You can take tylenol and ibuprofen for the wrist pain.   Take care.  Glad to see you.

## 2018-01-05 ENCOUNTER — Encounter: Payer: Self-pay | Admitting: Family Medicine

## 2018-01-05 DIAGNOSIS — M25519 Pain in unspecified shoulder: Secondary | ICD-10-CM | POA: Insufficient documentation

## 2018-01-05 NOTE — Assessment & Plan Note (Signed)
Imaging discussed with patient, images reviewed at office visit.  Likely with recent COPD exacerbation.  Baseline changes in the lung associated with COPD discussed with patient.  He is improved.  Continue as needed albuterol for now.  If needing frequently he will update me and we can change him to a long-term preventive inhaler.  Rationale discussed with patient.  Okay for outpatient follow-up.

## 2018-01-05 NOTE — Assessment & Plan Note (Signed)
He has arthritic changes noted on imaging.  Imaging reviewed with patient at the office visit.  He can take Tylenol instead of ibuprofen as needed.  Cautions given.  He understood.

## 2018-01-05 NOTE — Assessment & Plan Note (Signed)
Likely rotator cuff symptoms.  Discussed with patient.  He has decrease in pain with scapular assist with internal rotation.  Routine home exercise program discussed with patient.  Rationale and anatomy explained to patient.  Update [place] as needed. >25 minutes spent in face to face time with patient, >50% spent in counselling or coordination of care, discussing COPD, wrist pain, shoulder pain.

## 2018-01-20 ENCOUNTER — Other Ambulatory Visit: Payer: Self-pay | Admitting: Family Medicine

## 2018-01-20 NOTE — Telephone Encounter (Signed)
Electronic refill request Last office visit 01/04/18 Medication is no longer on the medication list.

## 2018-01-21 NOTE — Telephone Encounter (Signed)
Sent. I don't see reason for him to stop med. If he has legitimate intolerance/reason not to take the med, then let me know.  Thanks.

## 2018-01-21 NOTE — Telephone Encounter (Signed)
Patient says he is tolerating it fine.

## 2018-01-23 ENCOUNTER — Other Ambulatory Visit: Payer: Self-pay | Admitting: Family Medicine

## 2018-01-25 NOTE — Telephone Encounter (Signed)
Electronic refill request. Trazodone Last office visit:   01/04/18 Last Filled:    180 tablet 3 02/17/2017

## 2018-01-26 NOTE — Telephone Encounter (Signed)
Sent. Thanks.   

## 2018-02-22 ENCOUNTER — Telehealth: Payer: Self-pay | Admitting: Family Medicine

## 2018-02-22 DIAGNOSIS — M25519 Pain in unspecified shoulder: Secondary | ICD-10-CM

## 2018-02-22 NOTE — Telephone Encounter (Signed)
Copied from [place] [phone]. Topic: Quick Communication - See Telephone Encounter >> Feb 22, 2018 12:12 PM Hewitt Shorts wrote: CRM for notification. See Telephone encounter for:  Pt  wife brenda is calling to let us know that patient is still having shoulder pain and is wanting to know what can be done next   Best number [phone] 02/22/18.

## 2018-02-22 NOTE — Telephone Encounter (Signed)
Pt advised and discussed referral appt-AH

## 2018-02-22 NOTE — Telephone Encounter (Signed)
Pt last seen 01/04/18.

## 2018-02-22 NOTE — Telephone Encounter (Signed)
Refer to ortho.  Thanks.

## 2018-02-26 DIAGNOSIS — L821 Other seborrheic keratosis: Secondary | ICD-10-CM | POA: Diagnosis not present

## 2018-02-26 DIAGNOSIS — M25512 Pain in left shoulder: Secondary | ICD-10-CM | POA: Diagnosis not present

## 2018-02-26 DIAGNOSIS — M25531 Pain in right wrist: Secondary | ICD-10-CM | POA: Diagnosis not present

## 2018-02-26 DIAGNOSIS — C44319 Basal cell carcinoma of skin of other parts of face: Secondary | ICD-10-CM | POA: Diagnosis not present

## 2018-02-26 DIAGNOSIS — M25511 Pain in right shoulder: Secondary | ICD-10-CM | POA: Diagnosis not present

## 2018-03-05 DIAGNOSIS — M25511 Pain in right shoulder: Secondary | ICD-10-CM | POA: Diagnosis not present

## 2018-03-11 ENCOUNTER — Encounter: Payer: Self-pay | Admitting: Family Medicine

## 2018-03-11 ENCOUNTER — Ambulatory Visit: Payer: Medicare Other | Admitting: Family Medicine

## 2018-03-11 ENCOUNTER — Other Ambulatory Visit: Payer: Self-pay | Admitting: Family Medicine

## 2018-03-11 ENCOUNTER — Ambulatory Visit (INDEPENDENT_AMBULATORY_CARE_PROVIDER_SITE_OTHER): Payer: Medicare Other | Admitting: Family Medicine

## 2018-03-11 VITALS — BP 144/92 | HR 86 | Temp 98.3°F | Wt 183.5 lb

## 2018-03-11 DIAGNOSIS — R399 Unspecified symptoms and signs involving the genitourinary system: Secondary | ICD-10-CM

## 2018-03-11 DIAGNOSIS — N481 Balanitis: Secondary | ICD-10-CM

## 2018-03-11 DIAGNOSIS — R682 Dry mouth, unspecified: Secondary | ICD-10-CM

## 2018-03-11 MED ORDER — TAMSULOSIN HCL 0.4 MG PO CAPS: 0.8000 mg | ORAL_CAPSULE | Freq: Every day | ORAL | Status: DC

## 2018-03-11 MED ORDER — KETOCONAZOLE 2 % EX CREA: 1.0000 "application " | TOPICAL_CREAM | Freq: Two times a day (BID) | CUTANEOUS | 1 refills | Status: DC | PRN

## 2018-03-11 NOTE — Patient Instructions (Signed)
Stop zyrtec and see if the dry mouth gets better.  Med list updated.  Try ketoconazole cream. If you have severe pain, then go to the ER.  Try taking 2 of the tamsulosin pills a day.  Take care.  Glad to see you.

## 2018-03-11 NOTE — Progress Notes (Signed)
R wrist brace and prev injection helped his shoulder pain.  He is using home exercise program with bands.  He has f/u pending at [facility].  He weaned down to rare ibuprofen now.  He doesn't take indomethacin with ibuprofen, has used indomethacin maybe once in the last 2 years.    Itching and discharge on penis.  Distal change, not on the shaft.  Started with sx about 1 week ago.  He can still retract the foreskin.  No fevers.    He has mouth dryness.  He keeps water by the bed.  D/w pt about his meds, which may contribute.    He has urinary urgency with less effective voiding, with nocturia.    He wanted to taper his meds and worked to shorten his med list. D/w pt.    Meds, vitals, and allergies reviewed.   ROS: Per HPI unless specifically indicated in ROS section   nad ncat Mildly dry mouth noted but o/w wnl Neck supple rrr abd soft Uncircumcised male, able to retract the foreskin.  Glans is irritated with likely superficial fungal infection.

## 2018-03-12 ENCOUNTER — Other Ambulatory Visit: Payer: Self-pay | Admitting: *Deleted

## 2018-03-12 DIAGNOSIS — N481 Balanitis: Secondary | ICD-10-CM | POA: Insufficient documentation

## 2018-03-12 DIAGNOSIS — M25511 Pain in right shoulder: Secondary | ICD-10-CM | POA: Diagnosis not present

## 2018-03-12 DIAGNOSIS — R682 Dry mouth, unspecified: Secondary | ICD-10-CM | POA: Insufficient documentation

## 2018-03-12 MED ORDER — TAMSULOSIN HCL 0.4 MG PO CAPS: 0.8000 mg | ORAL_CAPSULE | Freq: Every day | ORAL | 2 refills | Status: DC

## 2018-03-12 NOTE — Assessment & Plan Note (Signed)
He had been taking super beta prostate and a bunch of other supplements.  At this point he still having lower urinary tract symptoms so his current medications are not fully effective.  Discussed with patient about options.  Would taper the supplements that are likely not as useful.  See med list.  Increase Flomax to 2 tabs a day.  Update me as needed.  Updated med list given to patient. >25 minutes spent in face to face time with patient, >50% spent in counselling or coordination of care.

## 2018-03-12 NOTE — Assessment & Plan Note (Signed)
Polypharmacy could contribute.  Most recent A1c was not significantly elevated.  Stop Zyrtec and update me as needed.

## 2018-03-12 NOTE — Assessment & Plan Note (Signed)
Likely fungal.  Routine cautions given to patient.  Still able to retract foreskin.  Use topical ketoconazole and update me as needed.  ER cautions given if he were unable to retract the foreskin or if he had emergent symptoms.

## 2018-04-07 DIAGNOSIS — M25512 Pain in left shoulder: Secondary | ICD-10-CM | POA: Diagnosis not present

## 2018-04-07 DIAGNOSIS — M25511 Pain in right shoulder: Secondary | ICD-10-CM | POA: Diagnosis not present

## 2018-04-19 ENCOUNTER — Telehealth: Payer: Self-pay | Admitting: Family Medicine

## 2018-04-19 NOTE — Telephone Encounter (Signed)
Patient's wife called and says Chantix not working. Asks if Zyban is still available and would like to try it.  Pharmacy: [id number] [address], [place] [phone] (Phone) [phone] (Fax)

## 2018-04-19 NOTE — Telephone Encounter (Signed)
Copied from [place] [id number]. Topic: Quick Communication - Rx Refill/Question >> Apr 19, 2018  1:15 PM Margot Ables wrote: Pt wife called stating that pt has had chantix and it doesn't seem to work for him to quit smoking. She asks if Zyban is still available and if Dr. Damita Dunnings would prescribe it? She said that she thinks it is covered by insurance for depression but not sure if they will cover it to quit smoking. She said she doesn't care how it is prescribed if it will work.

## 2018-04-21 MED ORDER — BUPROPION HCL ER (SR) 150 MG PO TB12: ORAL_TABLET | ORAL | 2 refills | Status: DC

## 2018-04-21 NOTE — Telephone Encounter (Signed)
Patient's wife notified as instructed by telephone. [name] stated that patient picked up a script at pharmacy for Chantix on 04/19/18. [name] stated that after she called the office she [facility] ready for pickup. [name] stated that she called the pharmacy and was told that Dr. Damita Dunnings had sent a script there to pick up for Chantix.  Patient's wife stated that she just figured that Dr. Damita Dunnings was not going to prescribe the Chantix so they picked it up and patient took the first one today. Kallie Edward that Dr. Damita Dunnings did not send a new script in for Chantix so she thinks that they filled this from a refill that was already on hold for patient. [name] stated that [facility] will [facility] to [facility] and see about getting the new script that was sent in today and talk with them about filling the Chantix after she had asked them if this was a new script and they told her yes. [name] stated that she will have her husband stop the Chantix and start the Zyban.

## 2018-04-21 NOTE — Telephone Encounter (Signed)
Thanks

## 2018-04-21 NOTE — Telephone Encounter (Signed)
Stop chantix.   Reasonable to try zyban.  I don't know if insurance will cover it.  Sent as generic.   Start with 1 tab a day.  Increase to BID thereafter if tolerated and needed for smoking cessation.  Try to quit smoking about 7 days into treatment.  Update me as needed.  Thanks.

## 2018-04-25 ENCOUNTER — Other Ambulatory Visit: Payer: Self-pay | Admitting: Family Medicine

## 2018-05-05 ENCOUNTER — Other Ambulatory Visit: Payer: Self-pay | Admitting: Family Medicine

## 2018-05-09 ENCOUNTER — Other Ambulatory Visit: Payer: Self-pay | Admitting: Family Medicine

## 2018-05-10 NOTE — Telephone Encounter (Signed)
Electronic refill request Last office visit 03/11/18 Medication is not on med list

## 2018-05-11 NOTE — Telephone Encounter (Signed)
Spoke to patient's wife [name] on [place])) and was advised that patient is taking Chantix, but does not need a refill. [name] stated that patient stated the Chantix and is doing okay with that. [name] stated that he also has a script on hand for Zyban that was sent in after he had already picked up the Chantix last time.[name] stated that when he stops the Chantix and feels that he still wants to smoke he will try the Zyban a few weeks later once the Chantix is out of his system. Patient's wife stated to deny the refill for Chantix because they do not need it.

## 2018-05-11 NOTE — Telephone Encounter (Signed)
Noted. Thanks.

## 2018-05-11 NOTE — Telephone Encounter (Signed)
Verify with patient about use, tolerance, etc.  Thanks.

## 2018-05-28 ENCOUNTER — Other Ambulatory Visit: Payer: Self-pay | Admitting: Family Medicine

## 2018-06-25 ENCOUNTER — Other Ambulatory Visit: Payer: Self-pay | Admitting: Family Medicine

## 2018-06-25 ENCOUNTER — Encounter: Payer: Self-pay | Admitting: *Deleted

## 2018-06-30 DIAGNOSIS — L564 Polymorphous light eruption: Secondary | ICD-10-CM | POA: Diagnosis not present

## 2018-06-30 DIAGNOSIS — L821 Other seborrheic keratosis: Secondary | ICD-10-CM | POA: Diagnosis not present

## 2018-06-30 DIAGNOSIS — D485 Neoplasm of uncertain behavior of skin: Secondary | ICD-10-CM | POA: Diagnosis not present

## 2018-06-30 DIAGNOSIS — L986 Other infiltrative disorders of the skin and subcutaneous tissue: Secondary | ICD-10-CM | POA: Diagnosis not present

## 2018-06-30 DIAGNOSIS — Z85828 Personal history of other malignant neoplasm of skin: Secondary | ICD-10-CM | POA: Diagnosis not present

## 2018-06-30 DIAGNOSIS — L570 Actinic keratosis: Secondary | ICD-10-CM | POA: Diagnosis not present

## 2018-07-13 ENCOUNTER — Telehealth: Payer: Self-pay

## 2018-07-13 NOTE — Telephone Encounter (Signed)
I spoke with [name] (DPR signed);  started 07/09/18 and on and off has chills and cannot hold urine;burning and pain when urinates started in PM on 07/12/18, no fever thru out this episode per wife, no back or abd pain that is new or unusual. No new confusion. Pt did start a new vitamin about one wk ago. Dr Damita Dunnings said to hold vitamin until seen, pt scheduled appt on 07/14/18 at 9:40 with Dr Lorelei Pont; if pt condition changes or worsens or pt develops fever, abd or back pain that is different than usual, cannot urinate or any new confusion to go to ED prior to appt. [name] voiced understanding. Offered for her to pick up sterile urine container and wipes but [name] said pt should be able to urinate when comes to [facility]. FYI to Dr Lorelei Pont.

## 2018-07-13 NOTE — Telephone Encounter (Signed)
Copied from [place] [phone]. Topic: Appointment Scheduling - Scheduling Inquiry for Clinic >> Jul 13, 2018  1:11 PM Synthia Innocent wrote: Reason for CRM: Patient wife calling to schedule appt with Dr Damita Dunnings for chills, and unable to hold urine as long as before. Offered appt with another provider, declined. Requesting to see Dr Damita Dunnings. Please advise

## 2018-07-14 ENCOUNTER — Encounter: Payer: Self-pay | Admitting: Family Medicine

## 2018-07-14 ENCOUNTER — Ambulatory Visit (INDEPENDENT_AMBULATORY_CARE_PROVIDER_SITE_OTHER): Payer: Medicare Other | Admitting: Family Medicine

## 2018-07-14 VITALS — BP 124/78 | HR 94 | Temp 97.9°F | Ht 65.5 in | Wt 177.0 lb

## 2018-07-14 DIAGNOSIS — N3001 Acute cystitis with hematuria: Secondary | ICD-10-CM | POA: Diagnosis not present

## 2018-07-14 DIAGNOSIS — M19039 Primary osteoarthritis, unspecified wrist: Secondary | ICD-10-CM | POA: Diagnosis not present

## 2018-07-14 DIAGNOSIS — R300 Dysuria: Secondary | ICD-10-CM

## 2018-07-14 DIAGNOSIS — M65331 Trigger finger, right middle finger: Secondary | ICD-10-CM

## 2018-07-14 LAB — POC URINALSYSI DIPSTICK (AUTOMATED)
Bilirubin, UA: NEGATIVE
Glucose, UA: POSITIVE — AB
KETONES UA: POSITIVE
Nitrite, UA: POSITIVE
PH UA: 6 (ref 5.0–8.0)
PROTEIN UA: POSITIVE — AB
Spec Grav, UA: 1.025 (ref 1.010–1.025)
Urobilinogen, UA: 0.2 E.U./dL

## 2018-07-14 MED ORDER — SULFAMETHOXAZOLE-TRIMETHOPRIM 800-160 MG PO TABS: 1.0000 | ORAL_TABLET | Freq: Two times a day (BID) | ORAL | 0 refills | Status: AC

## 2018-07-14 NOTE — Progress Notes (Signed)
Dr. Karleen Hampshire T. Addalie Calles, MD, CAQ Sports [facility] [address] [place], [address] Phone: [phone] Fax: [phone]  07/14/2018  Patient: Nathan Fernandez, MRN: [id number], DOB: [birth year], 77 y.o.  Primary Physician:  Joaquim Nam, MD   Chief Complaint  Patient presents with  . pain and burning with urination    started Friday night   Subjective:   This 77 y.o. male patient presents with burning, urgency. No vaginal discharge or external irritation.  No STD exposure. No abd pain, no flank pain.  Got worse to a point  He also had some questions regarding a trigger finger on the right finger.  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  The PMH, PSH, Social History, Family History, Medications, and allergies have been reviewed in [facility], and have been updated if relevant.  Patient Active Problem List   Diagnosis Date Noted  . Balanitis 03/12/2018  . Dry mouth 03/12/2018  . Shoulder pain 01/05/2018  . Hypoxia 12/29/2017  . Wrist pain 12/29/2017  . Lower back pain 02/18/2017  . Lower urinary tract symptoms (LUTS) 02/11/2016  . Bilateral knee pain 05/25/2015  . Benign paroxysmal positional vertigo 02/18/2015  . Abdominal wall symptom 02/18/2015  . Gout 05/04/2014  . Healthcare maintenance 04/18/2014  . COPD (chronic obstructive pulmonary disease) (HCC) 03/25/2013  . CAD (coronary artery disease) 03/03/2013  . Other malaise and fatigue 02/22/2013  . Chest wall pain 05/31/2012  . Heartburn 05/07/2011  . DEPRESSION, MILD 11/28/2010  . 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 Diabetes mellitus (HCC) 03/18/2010  . 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 HYPERCHOLESTEROLEMIA 10/19/2007  . 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 HYPERTENSION, BENIGN 10/19/2007  . ROSACEA 10/19/2007  . DEGENERATIVE  JOINT DISEASE 10/19/2007  . Insomnia 10/19/2007    Past Medical History:  Diagnosis Date  . 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   Hosp R facial weakness  . CAD (coronary artery disease)    MI, Hosp 1993, PTCA; LHC 04/18/13: Distal left main 40%, proximal LAD with several aneurysmal segments, proximal LAD 50% prior to and after aneurysmal segments, area does not appear to flow-limiting, proximal diagonal 70%, ostial circumflex 20%, proximal circumflex 20%, OM1 30-40%, proximal RCA 40%, mid RCA 30%, distal RCA 20%, EF 50% => med Rx  . COPD (chronic obstructive pulmonary disease) (HCC)   . DM2 (diabetes mellitus, type 2) (HCC)   . History of ETT 1998   wnl  . History of MRI 08/04/09   brain- atrophy sm vess dz  . HLD (hyperlipidemia)   . HTN (hypertension)   . OA (osteoarthritis)     Past Surgical History:  Procedure Laterality Date  . cataract surgery  09/06   repair lens which moved  . stress myoview  06/29/06   sm distal anteroseptal & apical infarct  . THYROIDECTOMY, PARTIAL  1967   B9 growth    Social History   Socioeconomic History  . Marital status: Married    Spouse name: Not on file  . Number of children: 4  . Years of education: Not on file  . Highest education level: Not on file  Occupational History  . 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[employer]    Comment: for [employer] with [employer]  Social Needs  . Financial resource strain: Not on file  . Food insecurity:    Worry:  Not on file    Inability: Not on file  . Transportation needs:    Medical: Not on file    Non-medical: Not on file  Tobacco Use  . Smoking status: Current Some Day Smoker    Types: Cigars  . Smokeless tobacco: Never Used  . Tobacco comment: cigar occassionally  Substance and Sexual Activity  . Alcohol use: Yes    Alcohol/week: 0.0 oz    Comment: very rare beer  . Drug use: No  . Sexual activity: Never  Lifestyle  . Physical activity:    Days per week: Not on file     Minutes per session: Not on file  . Stress: Not on file  Relationships  . Social connections:    Talks on phone: Not on file    Gets together: Not on file    Attends religious service: Not on file    Active member of club or organization: Not on file    Attends meetings of clubs or organizations: Not on file    Relationship status: Not on file  . Intimate partner violence:    Fear of current or ex partner: Not on file    Emotionally abused: Not on file    Physically abused: Not on file    Forced sexual activity: Not on file  Other Topics Concern  . Not on file  Social History Narrative   Divorced, lives with partner Steward Drone)- married 09/2015   3 living children, had a daughter who died at age 77 in 11-21-2023   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   Working as of 2018 (in [place])    Family History  Problem Relation Age of Onset  . Hypertension Mother   . Aneurysm Mother   . Stroke Mother   . Hypertension Father   . Heart disease Father        CAD  . Heart disease Son        MI  . Colon cancer Neg Hx   . Prostate cancer Neg Hx     Allergies  Allergen Reactions  . Ace Inhibitors Swelling    Swelling of the tongue  . Angiotensin Receptor Blockers Swelling    Tongue swelling  . Enalapril Swelling    Tongue swelling    Medication list reviewed and updated in full in [facility].  GEN:  no fevers, chills. GI: No n/v/d, eating normally Otherwise, ROS is as per the HPI.  Objective:   Blood pressure 124/78, pulse 94, temperature 97.9 F (36.6 C), temperature source Oral, height 5' 5.5" (1.664 m), weight 177 lb (80.3 kg).  GEN: WDWN, A&Ox4,NAD. Non-toxic HEENT: Atraumatc, normocephalic. CV: RRR, No M/G/R PULM: CTA B, No wheezes, crackles, or rhonchi ABD: S, NT, ND, +BS, no rebound. No CVAT. No suprapubic tenderness. EXT: No c/c/e  Right third digit with triggering at the A1 pulley.  He also has diffuse hand osteoarthritis as well as wrist  osteoarthritis.  Objective Data:  Assessment and Plan:   Dysuria - Plan: POCT Urinalysis Dipstick (Automated), Urine Culture  Acute cystitis with hematuria - Plan: Urine Culture  Acquired trigger finger of right middle finger  Localized osteoarthritis of wrist  Rx with ABX as below. Drink plenty of fluids and supportive care.  Did my best to advise the gentleman regarding his other conditions, and I be happy to inject his trigger finger when he is not actively infected.  We also discussed osteoarthritis, he is taking some different supplements including tumor, which I think would  be helpful.  Follow-up: No follow-ups on file.  Meds ordered this encounter  Medications  . sulfamethoxazole-trimethoprim (BACTRIM DS,SEPTRA DS) 800-160 MG tablet    Sig: Take 1 tablet by mouth 2 (two) times daily for 7 days.    Dispense:  14 tablet    Refill:  0   Orders Placed This Encounter  Procedures  . Urine Culture  . POCT Urinalysis Dipstick (Automated)    Signed,  Mayuri Staples T. Juniper Snyders, MD   Patient's Medications  New Prescriptions   SULFAMETHOXAZOLE-TRIMETHOPRIM (BACTRIM DS,SEPTRA DS) 800-160 MG TABLET    Take 1 tablet by mouth 2 (two) times daily for 7 days.  Previous Medications   ALBUTEROL (PROVENTIL HFA;VENTOLIN HFA) 108 (90 BASE) MCG/ACT INHALER    Inhale into the lungs every 6 (six) hours as needed for wheezing or shortness of breath.   ASPIRIN 81 MG EC TABLET    Take 81 mg by mouth daily.     BENZONATATE (TESSALON) 200 MG CAPSULE    Take 1 capsule (200 mg total) by mouth 3 (three) times daily as needed for cough.   BUPROPION (WELLBUTRIN SR) 150 MG 12 HR TABLET    1 tab a day for 3 day, then 1 tab twice a day thereafter.   CYCLOBENZAPRINE (FLEXERIL) 5 MG TABLET    TAKE 1-2 TABLETS BY MOUTH 3 TIMES DAILY AS NEEDED FOR MUSCLE SPASMS   GEMFIBROZIL (LOPID) 600 MG TABLET    TAKE 1 TABLET BY MOUTH 2 TIMES DAILY   IBUPROFEN (ADVIL,MOTRIN) 200 MG TABLET    Take 400 mg by mouth every 6  (six) hours as needed (for pain).   INDOMETHACIN (INDOCIN) 50 MG CAPSULE    TAKE ONE CAPSULE BY MOUTH TWICE A DAY AS NEEDED WITH A MEAL FOR GOUT   KETOCONAZOLE (NIZORAL) 2 % CREAM    Apply 1 application topically 2 (two) times daily as needed for irritation.   MECLIZINE (ANTIVERT) 12.5 MG TABLET    Take 1-2 tablets (12.5-25 mg total) by mouth 3 (three) times daily as needed for dizziness.   MULTIPLE VITAMIN (MULTIVITAMIN) TABLET    Take 1 tablet by mouth daily.     NITROGLYCERIN (NITROSTAT) 0.4 MG SL TABLET    Place one under tongue at onset of chest pain, may repeat in 5 minutes   OMEGA-3 FATTY ACIDS (FISH OIL) 1000 MG CAPS    Take 1,000 mg by mouth daily.    PRAVASTATIN (PRAVACHOL) 40 MG TABLET    TAKE 1 TABLET BY MOUTH AT BEDTIME   RANITIDINE (ZANTAC) 150 MG TABLET    Take 1 tablet (150 mg total) by mouth 2 (two) times daily.   TAMSULOSIN (FLOMAX) 0.4 MG CAPS CAPSULE    TAKE 2 CAPSULES BY MOUTH EVERY DAY   TRAZODONE (DESYREL) 100 MG TABLET    TAKE 2 TABLETS BY MOUTH AT BEDTIME  Modified Medications   No medications on file  Discontinued Medications   TAMSULOSIN (FLOMAX) 0.4 MG CAPS CAPSULE    TAKE 2 CAPSULES BY MOUTH EVERY DAY

## 2018-07-16 LAB — URINE CULTURE
MICRO NUMBER:: 90876125
SPECIMEN QUALITY: ADEQUATE

## 2018-07-20 ENCOUNTER — Telehealth: Payer: Self-pay

## 2018-07-20 DIAGNOSIS — R3914 Feeling of incomplete bladder emptying: Secondary | ICD-10-CM

## 2018-07-20 DIAGNOSIS — D485 Neoplasm of uncertain behavior of skin: Secondary | ICD-10-CM | POA: Diagnosis not present

## 2018-07-20 DIAGNOSIS — L988 Other specified disorders of the skin and subcutaneous tissue: Secondary | ICD-10-CM | POA: Diagnosis not present

## 2018-07-20 NOTE — Telephone Encounter (Signed)
Copied from [place] [phone] [id number]. Topic: Referral - Request >> Jul 20, 2018 10:44 AM Lennox Solders wrote: Reason for CRM: pt wife is calling and would like her husband to be referral to dr borden urologist  [place]  he does not have the pressure to  empty his bladder completing.

## 2018-07-20 NOTE — Telephone Encounter (Signed)
Pt last seen 07/14/18 for dysuria.Please advise.

## 2018-07-21 NOTE — Telephone Encounter (Signed)
done

## 2018-07-23 ENCOUNTER — Other Ambulatory Visit: Payer: Self-pay | Admitting: Family Medicine

## 2018-07-23 ENCOUNTER — Encounter: Payer: Self-pay | Admitting: *Deleted

## 2018-07-27 ENCOUNTER — Other Ambulatory Visit: Payer: Self-pay | Admitting: Family Medicine

## 2018-07-30 DIAGNOSIS — Z4802 Encounter for removal of sutures: Secondary | ICD-10-CM | POA: Diagnosis not present

## 2018-08-05 ENCOUNTER — Telehealth: Payer: Self-pay | Admitting: *Deleted

## 2018-08-05 MED ORDER — CIPROFLOXACIN HCL 250 MG PO TABS: 250.0000 mg | ORAL_TABLET | Freq: Two times a day (BID) | ORAL | 0 refills | Status: DC

## 2018-08-05 NOTE — Telephone Encounter (Signed)
Copied from [place] [phone]. Topic: General - Other >> Aug 05, 2018  4:10 PM Judyann Munson wrote: Reason for CRM:  Patient wife is calling in regards to the patient last visit on 07-17-18, with Dr.copland. The patient is given a antibiotic for a UTI. The patient is experience burning  when urine again. She is requesting if another antibiotic can be called in.  Please advise

## 2018-08-05 NOTE — Telephone Encounter (Signed)
Reasonable. Can you help?  Culture proven  cipro 250 mg, 1 po bid, #14

## 2018-08-05 NOTE — Telephone Encounter (Signed)
Wife advised. 

## 2018-08-05 NOTE — Telephone Encounter (Signed)
cipro rx resent.  If not better then needs recheck.  Thanks.

## 2018-08-24 ENCOUNTER — Other Ambulatory Visit: Payer: Self-pay | Admitting: Family Medicine

## 2018-08-24 ENCOUNTER — Ambulatory Visit (INDEPENDENT_AMBULATORY_CARE_PROVIDER_SITE_OTHER): Payer: Medicare Other

## 2018-08-24 VITALS — BP 122/80 | HR 92 | Temp 97.4°F | Ht 66.5 in | Wt 178.5 lb

## 2018-08-24 DIAGNOSIS — E119 Type 2 diabetes mellitus without complications: Secondary | ICD-10-CM

## 2018-08-24 DIAGNOSIS — M109 Gout, unspecified: Secondary | ICD-10-CM | POA: Diagnosis not present

## 2018-08-24 DIAGNOSIS — Z0000 Encounter for general adult medical examination without abnormal findings: Secondary | ICD-10-CM | POA: Diagnosis not present

## 2018-08-24 DIAGNOSIS — R3915 Urgency of urination: Secondary | ICD-10-CM | POA: Diagnosis not present

## 2018-08-24 DIAGNOSIS — R3912 Poor urinary stream: Secondary | ICD-10-CM | POA: Diagnosis not present

## 2018-08-24 DIAGNOSIS — R350 Frequency of micturition: Secondary | ICD-10-CM | POA: Diagnosis not present

## 2018-08-24 LAB — COMPREHENSIVE METABOLIC PANEL
ALT: 15 U/L (ref 0–53)
AST: 16 U/L (ref 0–37)
Albumin: 3.9 g/dL (ref 3.5–5.2)
Alkaline Phosphatase: 44 U/L (ref 39–117)
BILIRUBIN TOTAL: 0.5 mg/dL (ref 0.2–1.2)
BUN: 16 mg/dL (ref 6–23)
CO2: 29 mEq/L (ref 19–32)
Calcium: 9.4 mg/dL (ref 8.4–10.5)
Chloride: 99 mEq/L (ref 96–112)
Creatinine, Ser: 0.88 mg/dL (ref 0.40–1.50)
GFR: 89.03 mL/min (ref >60.00)
GLUCOSE: 224 mg/dL — AB (ref 70–99)
Potassium: 4.2 mEq/L (ref 3.5–5.1)
Sodium: 135 mEq/L (ref 135–145)
Total Protein: 6.9 g/dL (ref 6.0–8.3)

## 2018-08-24 LAB — LIPID PANEL
CHOL/HDL RATIO: 4
Cholesterol: 126 mg/dL (ref 0–200)
HDL: 31.1 mg/dL — ABNORMAL LOW (ref >39.00)
NONHDL: 94.49
TRIGLYCERIDES: 347 mg/dL — AB (ref 0.0–149.0)
VLDL: 69.4 mg/dL — ABNORMAL HIGH (ref 0.0–40.0)

## 2018-08-24 LAB — LDL CHOLESTEROL, DIRECT: Direct LDL: 66 mg/dL

## 2018-08-24 LAB — HEMOGLOBIN A1C: Hgb A1c MFr Bld: 9.2 % — ABNORMAL HIGH (ref 4.6–6.5)

## 2018-08-24 LAB — URIC ACID: Uric Acid, Serum: 6 mg/dL (ref 4.0–7.8)

## 2018-08-24 NOTE — Patient Instructions (Signed)
Nathan Fernandez , Thank you for taking time to come for your Medicare Wellness Visit. I appreciate your ongoing commitment to your health goals. Please review the following plan we discussed and let me know if I can assist you in the future.   These are the goals we discussed: Goals    . Eat more fruits and vegetables     Starting 08/24/2018, I will continue to decrease intake of starchy foods and increase intake of fresh fruits and vegetables to 5 servings day.       This is a list of the screening recommended for you and due dates:  Health Maintenance  Topic Date Due  . Complete foot exam   08/31/2018*  . Eye exam for diabetics  12/21/2018*  . Flu Shot  03/23/2019*  . Pneumonia vaccines (2 of 2 - PPSV23) 08/25/2019*  . Tetanus Vaccine  08/31/2021*  . Hemoglobin A1C  02/22/2019  *Topic was postponed. The date shown is not the original due date.   Preventive Care for Adults  A healthy lifestyle and preventive care can promote health and wellness. Preventive health guidelines for adults include the following key practices.  . A routine yearly physical is a good way to check with your health care provider about your health and preventive screening. It is a chance to share any concerns and updates on your health and to receive a thorough exam.  . Visit your dentist for a routine exam and preventive care every 6 months. Brush your teeth twice a day and floss once a day. Good oral hygiene prevents tooth decay and gum disease.  . The frequency of eye exams is based on your age, health, family medical history, use  of contact lenses, and other factors. Follow your health care provider's recommendations for frequency of eye exams.  . Eat a healthy diet. Foods like vegetables, fruits, whole grains, low-fat dairy products, and lean protein foods contain the nutrients you need without too many calories. Decrease your intake of foods high in solid fats, added sugars, and salt. Eat the right amount of  calories for you. Get information about a proper diet from your health care provider, if necessary.  . Regular physical exercise is one of the most important things you can do for your health. Most adults should get at least 150 minutes of moderate-intensity exercise (any activity that increases your heart rate and causes you to sweat) each week. In addition, most adults need muscle-strengthening exercises on 2 or more days a week.  Silver Sneakers may be a benefit available to you. To determine eligibility, you may visit the website: www.silversneakers.com or contact program at (716)116-8727 Mon-Fri between 8AM-8PM.   . Maintain a healthy weight. The body mass index (BMI) is a screening tool to identify possible weight problems. It provides an estimate of body fat based on height and weight. Your health care provider can find your BMI and can help you achieve or maintain a healthy weight.   For adults 20 years and older: ? A BMI below 18.5 is considered underweight. ? A BMI of 18.5 to 24.9 is normal. ? A BMI of 25 to 29.9 is considered overweight. ? A BMI of 30 and above is considered obese.   . Maintain normal blood lipids and cholesterol levels by exercising and minimizing your intake of saturated fat. Eat a balanced diet with plenty of fruit and vegetables. Blood tests for lipids and cholesterol should begin at age 81 and be repeated every 5 years.  If your lipid or cholesterol levels are high, you are over 50, or you are at high risk for heart disease, you may need your cholesterol levels checked more frequently. Ongoing high lipid and cholesterol levels should be treated with medicines if diet and exercise are not working.  . If you smoke, find out from your health care provider how to quit. If you do not use tobacco, please do not start.  . If you choose to drink alcohol, please do not consume more than 2 drinks per day. One drink is considered to be 12 ounces (355 mL) of beer, 5 ounces  (148 mL) of wine, or 1.5 ounces (44 mL) of liquor.  . If you are 35-2 years old, ask your health care provider if you should take aspirin to prevent strokes.  . Use sunscreen. Apply sunscreen liberally and repeatedly throughout the day. You should seek shade when your shadow is shorter than you. Protect yourself by wearing long sleeves, pants, a wide-brimmed hat, and sunglasses year round, whenever you are outdoors.  . Once a month, do a whole body skin exam, using a mirror to look at the skin on your back. Tell your health care provider of new moles, moles that have irregular borders, moles that are larger than a pencil eraser, or moles that have changed in shape or color.

## 2018-08-24 NOTE — Progress Notes (Signed)
PCP notes:   Health maintenance:  Foot exam - PCP follow-up needed Eye exam - addressed Flu vaccine - addressed PPSV23 - pt declined A1C - completed  Abnormal screenings:   Hearing - failed  Hearing Screening   125Hz  250Hz  500Hz  1000Hz  2000Hz  3000Hz  4000Hz  6000Hz  8000Hz   Right ear:   0 0 0  0    Left ear:   0 0 40  0     Patient concerns:   None  Nurse concerns:  None  Next PCP appt:   08/31/18 @ 1045

## 2018-08-24 NOTE — Progress Notes (Signed)
Subjective:   Nathan Fernandez is a 77 y.o. male who presents for Medicare Annual/Subsequent preventive examination.  Review of Systems:  N/A Cardiac Risk Factors include: advanced age (>71men, >13 women);male gender;diabetes mellitus;hypertension;dyslipidemia;smoking/ tobacco exposure     Objective:    Vitals: BP 122/80 (BP Location: Right Arm, Patient Position: Sitting, Cuff Size: Normal)   Pulse 92   Temp (!) 97.4 F (36.3 C) (Oral)   Ht 5' 6.5" (1.689 m) Comment: shoes  Wt 178 lb 8 oz (81 kg)   SpO2 90%   BMI 28.38 kg/m   Body mass index is 28.38 kg/m.  Advanced Directives 08/24/2018 12/29/2017 02/16/2017 02/08/2016  Does Patient Have a Medical Advance Directive? Yes No Yes Yes  Type of Paramedic of [place];Living will - [place] in Chart? No - copy requested - No - copy requested No - copy requested  Would patient like information on creating a medical advance directive? - No - Patient declined - -    Tobacco Social History   Tobacco Use  Smoking Status Current Some Day Smoker  . Types: Cigars  Smokeless Tobacco Never Used  Tobacco Comment   cigar occassionally     Ready to quit: No Counseling given: No Comment: cigar occassionally   Clinical Intake:  Pre-visit preparation completed: Yes  Pain : No/denies pain Pain Score: 0-No pain     Nutritional Status: BMI 25 -29 Overweight Nutritional Risks: None Diabetes: No  How often do you need to have someone help you when you read instructions, pamphlets, or other written materials from your doctor or pharmacy?: 1 - Never What is the last grade level you completed in school?: 12th grade + 2 yrs college  Interpreter Needed?: No  Comments: pt lives with spouse Information entered by :: LPinson, LPN  Past Medical History:  Diagnosis Date  . 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   Hosp R facial  weakness  . CAD (coronary artery disease)    MI, [place], PTCA; [place] 04/18/13: Distal left main 40%, proximal LAD with several aneurysmal segments, proximal LAD 50% prior to and after aneurysmal segments, area does not appear to flow-limiting, proximal diagonal 70%, ostial circumflex 20%, proximal circumflex 20%, OM1 30-40%, proximal RCA 40%, mid RCA 30%, distal RCA 20%, EF 50% => med Rx  . COPD (chronic obstructive pulmonary disease) ([place])   . DM2 (diabetes mellitus, type 2) ([place])   . History of ETT 1998   wnl  . History of MRI 08/04/09   brain- atrophy sm vess dz  . HLD (hyperlipidemia)   . HTN (hypertension)   . OA (osteoarthritis)    Past Surgical History:  Procedure Laterality Date  . cataract surgery  09/06   repair lens which moved  . stress myoview  06/29/06   sm distal anteroseptal & apical infarct  . THYROIDECTOMY, PARTIAL  1967   B9 growth   Family History  Problem Relation Age of Onset  . Hypertension Mother   . Aneurysm Mother   . Stroke Mother   . Hypertension Father   . Heart disease Father        CAD  . Heart disease Son        MI  . Colon cancer Neg Hx   . Prostate cancer Neg Hx    Social History   Socioeconomic History  . Marital status: Married    Spouse name: Not on file  .  Number of children: 4  . Years of education: Not on file  . Highest education level: Not on file  Occupational History  . 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 Financial resource strain: Not on file  . Food insecurity:    Worry: Not on file    Inability: Not on file  . Transportation needs:    Medical: Not on file    Non-medical: Not on file  Tobacco Use  . Smoking status: Current Some Day Smoker    Types: Cigars  . Smokeless tobacco: Never Used  . Tobacco comment: cigar occassionally  Substance and Sexual Activity  . Alcohol use: Yes    Alcohol/week: 0.0 standard drinks     Comment: very rare beer  . Drug use: No  . Sexual activity: Never  Lifestyle  . Physical activity:    Days per week: Not on file    Minutes per session: Not on file  . Stress: Not on file  Relationships  . Social connections:    Talks on phone: Not on file    Gets together: Not on file    Attends religious service: Not on file    Active member of club or organization: Not on file    Attends meetings of clubs or organizations: Not on file    Relationship status: Not on file  Other Topics Concern  . Not on file  Social History Narrative   Divorced, lives with partner [name])- married 09/2015   3 living children, had a daughter who died at age 44 in November 13, 2023   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   Working as of 2018 (in [place])    Outpatient Encounter Medications as of 08/24/2018  Medication Sig  . albuterol (PROVENTIL HFA;VENTOLIN HFA) 108 (90 Base) MCG/ACT inhaler Inhale into the lungs every 6 (six) hours as needed for wheezing or shortness of breath.  Marland Kitchen aspirin 81 MG EC tablet Take 81 mg by mouth daily.    Marland Kitchen buPROPion (WELLBUTRIN SR) 150 MG 12 hr tablet 1 tablet twice daily.  . cyclobenzaprine (FLEXERIL) 5 MG tablet TAKE 1-2 TABLETS BY MOUTH 3 TIMES DAILY AS NEEDED FOR MUSCLE SPASMS (Patient taking differently: Take 5-10 mg by mouth three times a day as needed for muscle spasms)  . gemfibrozil (LOPID) 600 MG tablet TAKE 1 TABLET BY MOUTH TWICE A DAY  . ibuprofen (ADVIL,MOTRIN) 200 MG tablet Take 400 mg by mouth every 6 (six) hours as needed (for pain).  . indomethacin (INDOCIN) 50 MG capsule TAKE ONE CAPSULE BY MOUTH TWICE A DAY AS NEEDED WITH A MEAL FOR GOUT (Patient taking differently: Take 50 mg by mouth 2 (two) times daily as needed (for gout/take with food). )  . ketoconazole (NIZORAL) 2 % cream Apply 1 application topically 2 (two) times daily as needed for irritation.  . meclizine (ANTIVERT) 12.5 MG tablet Take 1-2 tablets (12.5-25 mg total) by mouth 3 (three) times daily as needed  for dizziness.  . Multiple Vitamin (MULTIVITAMIN) tablet Take 1 tablet by mouth daily.    . nitroGLYCERIN (NITROSTAT) 0.4 MG SL tablet Place one under tongue at onset of chest pain, may repeat in 5 minutes (Patient taking differently: Place 0.4 mg under the tongue See admin instructions. 0.4 mg sublingually at onset of chest pain; may repeat in 5 minutes if no relief)  . Omega-3 Fatty Acids (FISH OIL) 1000 MG CAPS Take 1,000 mg by mouth  daily.   . pravastatin (PRAVACHOL) 40 MG tablet TAKE 1 TABLET BY MOUTH AT BEDTIME  . ranitidine (ZANTAC) 150 MG tablet Take 1 tablet (150 mg total) by mouth 2 (two) times daily.  . tamsulosin (FLOMAX) 0.4 MG CAPS capsule TAKE 2 CAPSULES BY MOUTH EVERY DAY  . traZODone (DESYREL) 100 MG tablet TAKE 2 TABLETS BY MOUTH AT BEDTIME  . benzonatate (TESSALON) 200 MG capsule Take 1 capsule (200 mg total) by mouth 3 (three) times daily as needed for cough. (Patient not taking: Reported on 08/24/2018)  . [DISCONTINUED] ciprofloxacin (CIPRO) 250 MG tablet Take 1 tablet (250 mg total) by mouth 2 (two) times daily.   No facility-administered encounter medications on file as of 08/24/2018.     Activities of Daily Living In your present state of health, do you have any difficulty performing the following activities: 08/24/2018  Hearing? Y  Vision? N  Difficulty concentrating or making decisions? N  Walking or climbing stairs? N  Dressing or bathing? N  Doing errands, shopping? N  Preparing Food and eating ? N  Using the Toilet? N  In the past six months, have you accidently leaked urine? N  Do you have problems with loss of bowel control? N  Managing your Medications? N  Managing your Finances? N  Housekeeping or managing your Housekeeping? N  Some recent data might be hidden    Patient Care Team: Tonia Ghent, MD as PCP - General (Family Medicine) Josue Hector, MD as Consulting Physician (Cardiology)   Assessment:   This is a routine wellness examination for  Jay.  Hearing Screening   125Hz  250Hz  500Hz  1000Hz  2000Hz  3000Hz  4000Hz  6000Hz  8000Hz   Right ear:   0 0 0  0    Left ear:   0 0 40  0      Visual Acuity Screening   Right eye Left eye Both eyes  Without correction: 20/70-1 20/30 20/30  With correction:        Exercise Activities and Dietary recommendations Current Exercise Habits: The patient does not participate in regular exercise at present, Exercise limited by: None identified  Goals    . Eat more fruits and vegetables     Starting 08/24/2018, I will continue to decrease intake of starchy foods and increase intake of fresh fruits and vegetables to 5 servings day.       Fall Risk Fall Risk  08/24/2018 02/16/2017 02/08/2016 04/17/2014  Falls in the past year? No No No No    Depression Screen PHQ 2/9 Scores 08/24/2018 02/16/2017 02/08/2016 04/17/2014  PHQ - 2 Score 0 0 0 2  PHQ- 9 Score 0 - - -    Cognitive Function MMSE - Mini Mental State Exam 08/24/2018 02/16/2017 02/08/2016  Orientation to time 5 5 5   Orientation to Place 5 5 5   Registration 3 3 3   Attention/ Calculation 0 0 5  Recall 3 1 3   Recall-comments - pt was unable to recall 2 of 3 words -  Language- name 2 objects 0 0 -  Language- repeat 1 1 1   Language- follow 3 step command 3 3 3   Language- read & follow direction 0 0 1  Write a sentence 0 0 -  Copy design 0 0 -  Total score 20 18 -     PLEASE NOTE: A Mini-Cog screen was completed. Maximum score is 20. A value of 0 denotes this part of Folstein MMSE was not completed or the patient failed this part of  the Mini-Cog screening.   Mini-Cog Screening Orientation to Time - Max 5 pts Orientation to Place - Max 5 pts Registration - Max 3 pts Recall - Max 3 pts Language Repeat - Max 1 pts Language Follow 3 Step Command - Max 3 pts     Immunization History  Administered Date(s) Administered  . Influenza Split 01/07/2012  . Influenza,inj,Quad PF,6+ Mos 02/08/2016  . Pneumococcal Conjugate-13 02/08/2016  . Td  09/01/2001   Screening Tests Health Maintenance  Topic Date Due  . FOOT EXAM  08/31/2018 (Originally 02/17/2018)  . OPHTHALMOLOGY EXAM  12/21/2018 (Originally 05/22/2016)  . INFLUENZA VACCINE  03/23/2019 (Originally 07/22/2018)  . PNA vac Low Risk Adult (2 of 2 - PPSV23) 08/25/2019 (Originally 02/07/2017)  . TETANUS/TDAP  08/31/2021 (Originally 09/02/2011)  . HEMOGLOBIN A1C  02/22/2019     Plan:     I have personally reviewed, addressed, and noted the following in the patient's chart:  A. Medical and social history B. Use of alcohol, tobacco or illicit drugs  C. Current medications and supplements D. Functional ability and status E.  Nutritional status F.  Physical activity G. Advance directives H. List of other physicians I.  Hospitalizations, surgeries, and ER visits in previous 12 months J.  [place] to include hearing, vision, cognitive, depression L. Referrals and appointments - none  In addition, I have reviewed and discussed with patient certain preventive protocols, quality metrics, and best practice recommendations. A written personalized care plan for preventive services as well as general preventive health recommendations were provided to patient.  See attached scanned questionnaire for additional information.   Signed,   Lindell Noe, MHA, BS, LPN Health Coach

## 2018-08-25 NOTE — Progress Notes (Signed)
I reviewed health advisor's note, was available for consultation on the day of service listed in this note, and agree with documentation and plan. Whisper Kurka, MD.   

## 2018-08-28 ENCOUNTER — Other Ambulatory Visit: Payer: Self-pay | Admitting: Family Medicine

## 2018-08-30 NOTE — Telephone Encounter (Signed)
Sent. Thanks.   

## 2018-08-30 NOTE — Telephone Encounter (Signed)
Electronic refill request Last office visit 08/24/18 Upcoming appointment 08/31/18 Last refill 07/27/18 #60

## 2018-08-31 ENCOUNTER — Ambulatory Visit (INDEPENDENT_AMBULATORY_CARE_PROVIDER_SITE_OTHER): Payer: Medicare Other | Admitting: Family Medicine

## 2018-08-31 ENCOUNTER — Encounter: Payer: Self-pay | Admitting: Family Medicine

## 2018-08-31 VITALS — BP 122/80 | HR 92 | Temp 97.4°F | Ht 66.5 in | Wt 178.5 lb

## 2018-08-31 DIAGNOSIS — R6889 Other general symptoms and signs: Secondary | ICD-10-CM

## 2018-08-31 DIAGNOSIS — E119 Type 2 diabetes mellitus without complications: Secondary | ICD-10-CM | POA: Diagnosis not present

## 2018-08-31 DIAGNOSIS — R251 Tremor, unspecified: Secondary | ICD-10-CM | POA: Diagnosis not present

## 2018-08-31 DIAGNOSIS — Z1211 Encounter for screening for malignant neoplasm of colon: Secondary | ICD-10-CM

## 2018-08-31 DIAGNOSIS — Z0000 Encounter for general adult medical examination without abnormal findings: Secondary | ICD-10-CM

## 2018-08-31 DIAGNOSIS — E7800 Pure hypercholesterolemia, unspecified: Secondary | ICD-10-CM

## 2018-08-31 DIAGNOSIS — Z7189 Other specified counseling: Secondary | ICD-10-CM

## 2018-08-31 DIAGNOSIS — G4700 Insomnia, unspecified: Secondary | ICD-10-CM

## 2018-08-31 DIAGNOSIS — I2510 Atherosclerotic heart disease of native coronary artery without angina pectoris: Secondary | ICD-10-CM

## 2018-08-31 DIAGNOSIS — M109 Gout, unspecified: Secondary | ICD-10-CM

## 2018-08-31 LAB — TSH: TSH: 2.39 u[IU]/mL (ref 0.35–4.50)

## 2018-08-31 LAB — CBC WITH DIFFERENTIAL/PLATELET
BASOS ABS: 0.1 10*3/uL (ref 0.0–0.1)
Basophils Relative: 0.8 % (ref 0.0–3.0)
Eosinophils Absolute: 0.3 10*3/uL (ref 0.0–0.7)
Eosinophils Relative: 4.3 % (ref 0.0–5.0)
HEMATOCRIT: 47.2 % (ref 39.0–52.0)
HEMOGLOBIN: 16.4 g/dL (ref 13.0–17.0)
LYMPHS PCT: 31.9 % (ref 12.0–46.0)
Lymphs Abs: 2.1 10*3/uL (ref 0.7–4.0)
MCHC: 34.7 g/dL (ref 30.0–36.0)
MCV: 88.9 fl (ref 78.0–100.0)
Monocytes Absolute: 0.6 10*3/uL (ref 0.1–1.0)
Monocytes Relative: 9.8 % (ref 3.0–12.0)
NEUTROS PCT: 53.2 % (ref 43.0–77.0)
Neutro Abs: 3.5 10*3/uL (ref 1.4–7.7)
Platelets: 176 10*3/uL (ref 150.0–400.0)
RBC: 5.31 Mil/uL (ref 4.22–5.81)
RDW: 14.3 % (ref 11.5–15.5)
WBC: 6.6 10*3/uL (ref 4.0–10.5)

## 2018-08-31 MED ORDER — NITROGLYCERIN 0.4 MG SL SUBL: SUBLINGUAL_TABLET | SUBLINGUAL | 12 refills | Status: DC

## 2018-08-31 NOTE — Progress Notes (Signed)
Diabetes:  No meds.  Hypoglycemic episodes: no sx Hyperglycemic episodes: no sx Feet problems: no Blood Sugars averaging: not checked eye exam within last year: pending for this week.   A1c was up, d/w pt.  He had been eating more cookies.  Discussed.  H/o cold intolerance noted by patient.  F/u labs pending.   Flu vaccine- pt declined, encouraged.  PNA 13- 2017.   Tetanus vaccine - may be cheaper at the pharmacy.  D/w pt.   Colonoscopy 2012, due for f/u.  Referral ordered 2019.   He admittedly failed to follow-up previously. Hearing screen failed.  D/w pt.  Declined hearing aids.   Wife [name] designated if patient were incapacitated.    Elevated Cholesterol: Using medications without problems: had only been on gemfibrozil QD, not BID.  D/w pt.   Still on pravastatin.   Muscle aches: Shoulder and hand pain noted, worse in the last few months. Diet compliance: d/w pt about cutting back on cookies.   Exercise: encouraged.    CAD.  No CP, not SOB.  No BLE edema.  D/w pt about routine cards f/u.    No recent gout flares in the last year or so.    Insomnia some better with trazodone, no ADE on med.    He quit smoking.  D/w pt.  I thanked him.   He has noted an episodic left greater than right hand tremor.  Not noted on exam today.  Going on episodically over the last few months.  PMH and SH reviewed  Meds, vitals, and allergies reviewed.   ROS: Per HPI unless specifically indicated in ROS section   GEN: nad, alert and oriented HEENT: mucous membranes moist NECK: supple w/o LA CV: rrr. PULM: ctab, no inc wob ABD: soft, +bs EXT: no edema SKIN: no acute rash No tremor noted. Gait normal.  Affect normal.  Diabetic foot exam: Normal inspection No skin breakdown No calluses  Normal DP pulses Normal sensation to light touch and monofilament Nails normal

## 2018-08-31 NOTE — Patient Instructions (Addendum)
Go to the lab on the way out.  We'll contact you with your lab report.  We will call about your referral.  Rosaria Ferries or Azalee Course will call you if you don't see one of them on the way out.  Cut out the cookies and recheck in 3 months.  The only lab you need to have done for your next diabetic visit is an A1c.  We can do this with a fingerstick test at the office visit.  You do not need a lab visit ahead of time for this.  It does not matter if you are fasting when the lab is done.    Call cardiology about follow up.   Stop the pravastatin for about 1 week and see if the aches get better.   If not better, then restart pravastatin and call [facility].   Let me know.   Take care.  Glad to see you.

## 2018-09-02 DIAGNOSIS — R251 Tremor, unspecified: Secondary | ICD-10-CM | POA: Insufficient documentation

## 2018-09-02 DIAGNOSIS — R6889 Other general symptoms and signs: Secondary | ICD-10-CM | POA: Insufficient documentation

## 2018-09-02 DIAGNOSIS — Z7189 Other specified counseling: Secondary | ICD-10-CM | POA: Insufficient documentation

## 2018-09-02 NOTE — Assessment & Plan Note (Signed)
Check routine labs today.  See notes on labs.

## 2018-09-02 NOTE — Assessment & Plan Note (Addendum)
He has been eating a lot of cookies.  He needs to stop that and stick with a diabetic diet and then recheck labs in about 3 months.  Discussed.  He understood.  >25 minutes spent in face to face time with patient, >50% spent in counselling or coordination of care.

## 2018-09-02 NOTE — Assessment & Plan Note (Signed)
No CP, not SOB.  No BLE edema.  D/w pt about routine cards f/u.  See avs.

## 2018-09-02 NOTE — Assessment & Plan Note (Signed)
Restart gemfibrozil twice a day.  Hold statin for few days.  If his joint aches or not better than I want him to follow-up with orthopedics.  Labs discussed with patient.  Needs work on diet and exercise.

## 2018-09-02 NOTE — Assessment & Plan Note (Signed)
Wife [name] designated if patient were incapacitated.

## 2018-09-02 NOTE — Assessment & Plan Note (Signed)
Intermittent, not noted on exam.  No alarming findings currently.  Would follow clinically.  Discussed.

## 2018-09-02 NOTE — Assessment & Plan Note (Signed)
Flu vaccine- pt declined, encouraged.  PNA 13- 2017.   Tetanus vaccine - may be cheaper at the pharmacy.  D/w pt.   Colonoscopy 2012, due for f/u.  Referral ordered 2019.   Hearing screen failed.  D/w pt.  Declined hearing aids.   Wife [name] designated if patient were incapacitated.

## 2018-09-02 NOTE — Assessment & Plan Note (Signed)
No recent flares.  Labs discussed with patient.

## 2018-09-02 NOTE — Assessment & Plan Note (Signed)
Continue trazodone as needed.  No adverse effect on medication.  It helps some.

## 2018-09-03 DIAGNOSIS — E119 Type 2 diabetes mellitus without complications: Secondary | ICD-10-CM | POA: Diagnosis not present

## 2018-09-03 DIAGNOSIS — Z961 Presence of intraocular lens: Secondary | ICD-10-CM | POA: Diagnosis not present

## 2018-09-03 DIAGNOSIS — H40013 Open angle with borderline findings, low risk, bilateral: Secondary | ICD-10-CM | POA: Diagnosis not present

## 2018-09-22 ENCOUNTER — Other Ambulatory Visit: Payer: Self-pay | Admitting: Family Medicine

## 2018-10-01 DIAGNOSIS — R350 Frequency of micturition: Secondary | ICD-10-CM | POA: Diagnosis not present

## 2018-10-04 DIAGNOSIS — Z86010 Personal history of colonic polyps: Secondary | ICD-10-CM | POA: Diagnosis not present

## 2018-10-04 LAB — HM COLONOSCOPY

## 2018-10-08 ENCOUNTER — Telehealth: Payer: Self-pay

## 2018-10-08 DIAGNOSIS — I2510 Atherosclerotic heart disease of native coronary artery without angina pectoris: Secondary | ICD-10-CM

## 2018-10-08 NOTE — Telephone Encounter (Signed)
Copied from [place] [id number]. Topic: Referral - Request for Referral >> Oct 08, 2018  2:42 PM Conception Chancy, NT wrote: Patient wife is calling and states that he is needing another referral to his cardiologist because he has not been seen there in 3 years. She would like for that referral to go Dr. Sheila Oats at [facility] on [address]. >> Oct 08, 2018  2:45 PM Conception Chancy, NT wrote: Fax # [phone]

## 2018-10-08 NOTE — Telephone Encounter (Signed)
Please advise 

## 2018-10-08 NOTE — Telephone Encounter (Signed)
Copied from [place] [phone]. Topic: General - Inquiry >> Oct 08, 2018  2:45 PM Conception Chancy, NT wrote: Reason for CRM: patient wife is calling to see if the office has received patients colonoscopy.

## 2018-10-10 NOTE — Telephone Encounter (Signed)
Ordered the referral to cardiology.  I don't see recent colonoscopy report.  Thanks.

## 2018-10-11 ENCOUNTER — Encounter: Payer: Self-pay | Admitting: Family Medicine

## 2018-10-11 NOTE — Telephone Encounter (Signed)
Patient's wife advised.  Wife stated that colonoscopy was 10/04/18 with Dr. Cristina Gong and was reported as normal and that he had graduated out of routine colonoscopies.

## 2018-10-18 ENCOUNTER — Other Ambulatory Visit: Payer: Self-pay | Admitting: Family Medicine

## 2018-10-18 ENCOUNTER — Encounter: Payer: Self-pay | Admitting: Gastroenterology

## 2018-10-18 NOTE — Telephone Encounter (Signed)
Electronic refill request Trazodone Last office visit 08/31/18 Last refill 01/26/18 #180/2

## 2018-10-19 NOTE — Telephone Encounter (Signed)
Sent. Thanks.   

## 2018-10-29 ENCOUNTER — Other Ambulatory Visit: Payer: Self-pay | Admitting: Family Medicine

## 2018-12-08 NOTE — Progress Notes (Signed)
Cardiology Office Note   Date:  12/10/2018   ID:  ARN MCOMBER, DOB 07/29/1941, MRN [id number]  PCP:  Tonia Ghent, MD  Cardiologist:   Jenkins Rouge, MD   No chief complaint on file.     History of Present Illness: Nathan Fernandez is a 77 y.o. male who presents for consultation regarding CAD. Referred by Dr Damita Dunnings Last seen by cardiology in 2014 Distant MI in 1990's Myovue done 04/07/13  With apical infarct and inferior wall ischemia Cath by CM 04/18/13 severe aneurysmal disease LAD 50% lesions with small D1 70% eminating from aneurysmal segment No significant disease in RCA/Circumflex EF 50% Medical Rx recommended   CRF;s include DM, HTN, HLD long smoking history but has quit  CXR 12/29/17 emphysema no acute lesions  Poor diet has lots of cookies A1c 9.2 Triglycerides 347 and LDL 66 on statin   Walks daily no angina   Past Medical History:  Diagnosis Date  . 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   Hosp R facial weakness  . CAD (coronary artery disease)    MI, [place], PTCA; [place] 04/18/13: Distal left main 40%, proximal LAD with several aneurysmal segments, proximal LAD 50% prior to and after aneurysmal segments, area does not appear to flow-limiting, proximal diagonal 70%, ostial circumflex 20%, proximal circumflex 20%, OM1 30-40%, proximal RCA 40%, mid RCA 30%, distal RCA 20%, EF 50% => med Rx  . COPD (chronic obstructive pulmonary disease) ([place])   . DM2 (diabetes mellitus, type 2) ([place])   . History of ETT 1998   wnl  . History of MRI 08/04/09   brain- atrophy sm vess dz  . HLD (hyperlipidemia)   . HTN (hypertension)   . OA (osteoarthritis)     Past Surgical History:  Procedure Laterality Date  . cataract surgery  09/06   repair lens which moved  . stress myoview  06/29/06   sm distal anteroseptal & apical infarct  . THYROIDECTOMY, PARTIAL  1967   B9 growth     Current Outpatient Medications  Medication Sig Dispense Refill  . albuterol (PROVENTIL HFA;VENTOLIN HFA) 108 (90  Base) MCG/ACT inhaler Inhale into the lungs every 6 (six) hours as needed for wheezing or shortness of breath.    Marland Kitchen aspirin 81 MG EC tablet Take 81 mg by mouth daily.      . benzonatate (TESSALON) 200 MG capsule Take 1 capsule (200 mg total) by mouth 3 (three) times daily as needed for cough. 30 capsule 1  . buPROPion (WELLBUTRIN SR) 150 MG 12 hr tablet TAKE 1 TABLET BY MOUTH TWICE A DAY 180 tablet 3  . cyclobenzaprine (FLEXERIL) 5 MG tablet TAKE 1-2 TABLETS BY MOUTH 3 TIMES DAILY AS NEEDED FOR MUSCLE SPASMS (Patient taking differently: Take 5-10 mg by mouth three times a day as needed for muscle spasms) 90 tablet 1  . gemfibrozil (LOPID) 600 MG tablet TAKE 1 TABLET BY MOUTH TWICE A DAY 180 tablet 3  . ibuprofen (ADVIL,MOTRIN) 200 MG tablet Take 400 mg by mouth every 6 (six) hours as needed (for pain).    . indomethacin (INDOCIN) 50 MG capsule TAKE ONE CAPSULE BY MOUTH TWICE A DAY AS NEEDED WITH A MEAL FOR GOUT (Patient taking differently: Take 50 mg by mouth 2 (two) times daily as needed (for gout/take with food). ) 30 capsule 0  . ketoconazole (NIZORAL) 2 % cream Apply 1 application topically 2 (two) times daily as needed for irritation. 15 g 1  . meclizine (ANTIVERT)  12.5 MG tablet Take 1-2 tablets (12.5-25 mg total) by mouth 3 (three) times daily as needed for dizziness.    . Multiple Vitamin (MULTIVITAMIN) tablet Take 1 tablet by mouth daily.      . nitroGLYCERIN (NITROSTAT) 0.4 MG SL tablet 0.4 mg sublingually at onset of chest pain; may repeat in 5 minutes if no relief 25 tablet 12  . Omega-3 Fatty Acids (FISH OIL) 1000 MG CAPS Take 1,000 mg by mouth daily.     . pravastatin (PRAVACHOL) 40 MG tablet TAKE 1 TABLET BY MOUTH AT BEDTIME 90 tablet 3  . ranitidine (ZANTAC) 150 MG tablet Take 1 tablet (150 mg total) by mouth 2 (two) times daily.    . tamsulosin (FLOMAX) 0.4 MG CAPS capsule TAKE 2 CAPSULES BY MOUTH EVERY DAY 180 capsule 3  . traZODone (DESYREL) 100 MG tablet TAKE 2 TABLETS BY MOUTH AT  BEDTIME 180 tablet 1   No current facility-administered medications for this visit.     Allergies:   Ace inhibitors; Angiotensin receptor blockers; and Enalapril    Social History:  The patient  reports that he has quit smoking. His smoking use included cigars. He has never used smokeless tobacco. He reports current alcohol use. He reports that he does not use drugs.   Family History:  The patient's family history includes Aneurysm in his mother; Heart disease in his father and son; Hypertension in his father and mother; Stroke in his mother.    ROS:  Please see the history of present illness.   Otherwise, review of systems are positive for none.   All other systems are reviewed and negative.    PHYSICAL EXAM: VS:  BP 126/72   Pulse 87   Ht 5' 6.5" (1.689 m)   Wt 178 lb (80.7 kg)   BMI 28.30 kg/m  , BMI Body mass index is 28.3 kg/m. Affect appropriate Healthy:  appears stated age 58: normal Neck supple with no adenopathy JVP normal no bruits no thyromegaly Lungs clear with no wheezing and good diaphragmatic motion Heart:  S1/S2 no murmur, no rub, gallop or click PMI normal Abdomen: benighn, BS positve, no tenderness, no AAA no bruit.  No HSM or HJR Distal pulses intact with no bruits No edema Neuro non-focal mild tremor in hands  Skin warm and dry No muscular weakness    EKG:  SR RBBB rate 82  12/30/17 12/10/18 SR rate 87 LAD RBBB    Recent Labs: 08/24/2018: ALT 15; BUN 16; Creatinine, Ser 0.88; Potassium 4.2; Sodium 135 08/31/2018: Hemoglobin 16.4; Platelets 176.0; TSH 2.39    Lipid Panel    Component Value Date/Time   CHOL 126 08/24/2018 1017   TRIG 347.0 (H) 08/24/2018 1017   HDL 31.10 (L) 08/24/2018 1017   CHOLHDL 4 08/24/2018 1017   VLDL 69.4 (H) 08/24/2018 1017   LDLCALC 103 (H) 02/16/2017 1130   LDLDIRECT 66.0 08/24/2018 1017      Wt Readings from Last 3 Encounters:  12/10/18 178 lb (80.7 kg)  08/31/18 178 lb 8 oz (81 kg)  08/24/18 178 lb 8 oz  (81 kg)      Other studies Reviewed: Additional studies/ records that were reviewed today include: Notes from cardiology 2014 Myovue and cath notes Notes from primary CXR and labs .    ASSESSMENT AND PLAN:  1.  CAD:  Complex LAD disease 7 years ago on cath will order exercise myovue to risk Stratify and check for progression of disease  2. HLD:  Continue  statin labs with primary discussed low carb low fat diet 3. DM:  Discussed low carb diet.  Target hemoglobin A1c is 6.5 or less.  Continue current medications. 4. HTN:  Well controlled.  Continue current medications and low sodium Dash type diet.   5. Emphysema  Quit smoking CXR January 2019 ok no active wheezing    Current medicines are reviewed at length with the patient today.  The patient does not have concerns regarding medicines.  The following changes have been made:  no change  Labs/ tests ordered today include: Ex Myovue   Orders Placed This Encounter  Procedures  . MYOCARDIAL PERFUSION IMAGING  . EKG 12-Lead     Disposition:   FU with me in a year if myovue low risk      Signed, Jenkins Rouge, MD  12/10/2018 11:58 AM    [facility] [place], [place], [place]  [address] Phone: [phone]; Fax: [phone]

## 2018-12-10 ENCOUNTER — Ambulatory Visit: Payer: Medicare Other | Admitting: Cardiovascular Disease

## 2018-12-10 ENCOUNTER — Encounter: Payer: Self-pay | Admitting: Cardiovascular Disease

## 2018-12-10 VITALS — BP 126/72 | HR 87 | Ht 66.5 in | Wt 178.0 lb

## 2018-12-10 DIAGNOSIS — E785 Hyperlipidemia, unspecified: Secondary | ICD-10-CM

## 2018-12-10 DIAGNOSIS — I2510 Atherosclerotic heart disease of native coronary artery without angina pectoris: Secondary | ICD-10-CM

## 2018-12-10 DIAGNOSIS — I2583 Coronary atherosclerosis due to lipid rich plaque: Secondary | ICD-10-CM | POA: Diagnosis not present

## 2018-12-10 DIAGNOSIS — I10 Essential (primary) hypertension: Secondary | ICD-10-CM

## 2018-12-10 NOTE — Patient Instructions (Addendum)
Medication Instructions:   If you need a refill on your cardiac medications before your next appointment, please call your pharmacy.   Lab work:  If you have labs (blood work) drawn today and your tests are completely normal, you will receive your results only by: Marland Kitchen MyChart Message (if you have MyChart) OR . A paper copy in the mail If you have any lab test that is abnormal or we need to change your treatment, we will call you to review the results.  Testing/Procedures: Your physician has requested that you have a lexiscan myoview. For further information please visit [email or web address]. Please follow instruction sheet, as given.  Follow-Up: At [facility], you and your health needs are our priority.  As part of our continuing mission to provide you with exceptional heart care, we have created designated Provider Care Teams.  These Care Teams include your primary Cardiologist (physician) and Advanced Practice Providers (APPs -  Physician Assistants and Nurse Practitioners) who all work together to provide you with the care you need, when you need it. You will need a follow up appointment in 12 months.  Please call our office 2 months in advance to schedule this appointment.  You may see Jenkins Rouge, MD or one of the following Advanced Practice Providers on your designated Care Team:   Truitt Merle, NP Cecilie Kicks, NP . Kathyrn Drown, NP

## 2019-01-10 ENCOUNTER — Telehealth (HOSPITAL_COMMUNITY): Payer: Self-pay | Admitting: *Deleted

## 2019-01-10 NOTE — Telephone Encounter (Signed)
Patient given detailed instructions per Myocardial Perfusion Study Information Sheet for the test on 11/23/19. Patient notified to arrive 15 minutes early and that it is imperative to arrive on time for appointment to keep from having the test rescheduled.  If you need to cancel or reschedule your appointment, please call the office within 24 hours of your appointment. . Patient verbalized understanding. Takira Sherrin Jacqueline    

## 2019-01-12 ENCOUNTER — Ambulatory Visit (HOSPITAL_COMMUNITY): Payer: Medicare Other | Attending: Cardiology

## 2019-01-12 DIAGNOSIS — I10 Essential (primary) hypertension: Secondary | ICD-10-CM

## 2019-01-12 DIAGNOSIS — E785 Hyperlipidemia, unspecified: Secondary | ICD-10-CM | POA: Diagnosis not present

## 2019-01-12 DIAGNOSIS — I2583 Coronary atherosclerosis due to lipid rich plaque: Secondary | ICD-10-CM | POA: Diagnosis not present

## 2019-01-12 DIAGNOSIS — I2510 Atherosclerotic heart disease of native coronary artery without angina pectoris: Secondary | ICD-10-CM

## 2019-01-12 LAB — MYOCARDIAL PERFUSION IMAGING
CHL CUP NUCLEAR SRS: 7
CHL CUP NUCLEAR SSS: 11
CSEPPHR: 96 {beats}/min
LV dias vol: 109 mL (ref 62–150)
LV sys vol: 74 mL
Rest HR: 86 {beats}/min
SDS: 3
TID: 0.98

## 2019-01-12 MED ORDER — TECHNETIUM TC 99M TETROFOSMIN IV KIT
10.1000 | PACK | Freq: Once | INTRAVENOUS | Status: AC | PRN
  Administered 2019-01-12: 10.1 via INTRAVENOUS
  Filled 2019-01-12: qty 11

## 2019-01-12 MED ORDER — TECHNETIUM TC 99M TETROFOSMIN IV KIT
31.1000 | PACK | Freq: Once | INTRAVENOUS | Status: AC | PRN
  Administered 2019-01-12: 31.1 via INTRAVENOUS
  Filled 2019-01-12: qty 32

## 2019-01-12 MED ORDER — REGADENOSON 0.4 MG/5ML IV SOLN
0.4000 mg | Freq: Once | INTRAVENOUS | Status: AC
  Administered 2019-01-12: 0.4 mg via INTRAVENOUS

## 2019-01-14 NOTE — H&P (View-Only) (Signed)
Cardiology Office Note   Date:  01/18/2019   ID:  [name], DOB July 25, 1941, MRN [id number]  PCP:  Tonia Ghent, MD  Cardiologist:   Jenkins Rouge, MD   No chief complaint on file.     History of Present Illness: Nathan Fernandez is a 78 y.o. male  First seen 12/09/18  regarding CAD. 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 Myovue done 04/07/13  With apical infarct and inferior wall ischemia Cath by CM 04/18/13 severe aneurysmal disease LAD 50% lesions with small D1 70% eminating from aneurysmal segment No significant disease in RCA/Circumflex EF 50% Medical Rx recommended   CRF;s include DM, HTN, HLD long smoking history but has quit  CXR 12/29/17 emphysema no acute lesions  Poor diet has lots of cookies A1c 9.2 Triglycerides 347 and LDL 66 on statin   Walks daily no angina   Myovue done 01/12/19 abnormal EF estimated at 32% with large anterior wall MI Deemed high risk due to low EF Returns to discuss medication changes and cath  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   Risks including stroke bleeding contrast reaction and need for emergency surgery discussed willing to proceed   Past Medical History:  Diagnosis Date  . 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   Hosp R facial weakness  . CAD (coronary artery disease)    MI, [place], PTCA; [place] 04/18/13: Distal left main 40%, proximal LAD with several aneurysmal segments, proximal LAD 50% prior to and after aneurysmal segments, area does not appear to flow-limiting, proximal diagonal 70%, ostial circumflex 20%, proximal circumflex 20%, OM1 30-40%, proximal RCA 40%, mid RCA 30%, distal RCA 20%, EF 50% =>  med Rx  . COPD (chronic obstructive pulmonary disease) ([place])   . DM2 (diabetes mellitus, type 2) ([place])   . History of ETT 1998   wnl  . History of MRI 08/04/09   brain- atrophy sm vess dz  . HLD (hyperlipidemia)   . HTN (hypertension)   . OA (osteoarthritis)     Past Surgical History:  Procedure Laterality Date  . cataract surgery  09/06   repair lens which moved  . stress myoview  06/29/06   sm distal anteroseptal & apical infarct  . THYROIDECTOMY, PARTIAL  1967   B9 growth     Current Outpatient Medications  Medication Sig Dispense Refill  . albuterol (PROVENTIL HFA;VENTOLIN HFA) 108 (90 Base) MCG/ACT inhaler Inhale into the lungs every 6 (six) hours as needed for wheezing or shortness of breath.    Marland Kitchen aspirin 81 MG EC tablet Take 81 mg by mouth daily.      . benzonatate (TESSALON) 200 MG capsule Take 1 capsule (200 mg total) by mouth 3 (three) times daily as needed for cough. 30 capsule 1  . buPROPion (WELLBUTRIN SR) 150 MG 12 hr tablet TAKE 1 TABLET BY MOUTH TWICE A DAY 180 tablet 3  . cyclobenzaprine (FLEXERIL) 5 MG tablet TAKE 1-2 TABLETS BY MOUTH 3 TIMES DAILY AS NEEDED FOR MUSCLE SPASMS (Patient taking differently: Take 5-10 mg by mouth three times a day as needed for muscle spasms) 90 tablet 1  . gemfibrozil (LOPID) 600 MG tablet  TAKE 1 TABLET BY MOUTH TWICE A DAY 180 tablet 3  . ibuprofen (ADVIL,MOTRIN) 200 MG tablet Take 400 mg by mouth every 6 (six) hours as needed (for pain).    . indomethacin (INDOCIN) 50 MG capsule TAKE ONE CAPSULE BY MOUTH TWICE A DAY AS NEEDED WITH A MEAL FOR GOUT (Patient taking differently: Take 50 mg by mouth 2 (two) times daily as needed (for gout/take with food). ) 30 capsule 0  . ketoconazole (NIZORAL) 2 % cream Apply 1 application topically 2 (two) times daily as needed for irritation. 15 g 1  . meclizine (ANTIVERT) 12.5 MG tablet Take 1-2 tablets (12.5-25 mg total) by mouth 3 (three) times daily as needed for dizziness.    . Multiple Vitamin  (MULTIVITAMIN) tablet Take 1 tablet by mouth daily.      . nitroGLYCERIN (NITROSTAT) 0.4 MG SL tablet 0.4 mg sublingually at onset of chest pain; may repeat in 5 minutes if no relief 25 tablet 12  . Omega-3 Fatty Acids (FISH OIL) 1000 MG CAPS Take 1,000 mg by mouth daily.     . pravastatin (PRAVACHOL) 40 MG tablet TAKE 1 TABLET BY MOUTH EVERYDAY AT BEDTIME 90 tablet 2  . ranitidine (ZANTAC) 150 MG tablet Take 1 tablet (150 mg total) by mouth 2 (two) times daily.    . tamsulosin (FLOMAX) 0.4 MG CAPS capsule TAKE 2 CAPSULES BY MOUTH EVERY DAY 180 capsule 3  . traZODone (DESYREL) 100 MG tablet TAKE 2 TABLETS BY MOUTH AT BEDTIME 180 tablet 1   No current facility-administered medications for this visit.     Allergies:   Ace inhibitors; Angiotensin receptor blockers; and Enalapril    Social History:  The patient  reports that he has quit smoking. His smoking use included cigars. He has never used smokeless tobacco. He reports current alcohol use. He reports that he does not use drugs.   Family History:  The patient's family history includes Aneurysm in his mother; Heart disease in his father and son; Hypertension in his father and mother; Stroke in his mother.    ROS:  Please see the history of present illness.   Otherwise, review of systems are positive for none.   All other systems are reviewed and negative.    PHYSICAL EXAM: VS:  BP 126/84   Pulse 93   Ht 5' 6.5" (1.689 m)   Wt 184 lb (83.5 kg)   BMI 29.25 kg/m  , BMI Body mass index is 29.25 kg/m. Affect appropriate Healthy:  appears stated age 39: normal Neck supple with no adenopathy JVP normal no bruits no thyromegaly Lungs clear with no wheezing and good diaphragmatic motion Heart:  S1/S2 no murmur, no rub, gallop or click PMI normal Abdomen: benighn, BS positve, no tenderness, no AAA no bruit.  No HSM or HJR Distal pulses intact with no bruits No edema Neuro non-focal mild tremor in hands  Skin warm and dry No  muscular weakness    EKG:  SR RBBB rate 82  12/30/17 12/10/18 SR rate 87 LAD RBBB  01/18/19 SR RBBB septal infarct    Recent Labs: 08/24/2018: ALT 15; BUN 16; Creatinine, Ser 0.88; Potassium 4.2; Sodium 135 08/31/2018: Hemoglobin 16.4; Platelets 176.0; TSH 2.39    Lipid Panel    Component Value Date/Time   CHOL 126 08/24/2018 1017   TRIG 347.0 (H) 08/24/2018 1017   HDL 31.10 (L) 08/24/2018 1017   CHOLHDL 4 08/24/2018 1017   VLDL 69.4 (H) 08/24/2018 1017   LDLCALC 103 (  H) 02/16/2017 1130   LDLDIRECT 66.0 08/24/2018 1017      Wt Readings from Last 3 Encounters:  01/18/19 184 lb (83.5 kg)  01/12/19 178 lb (80.7 kg)  12/10/18 178 lb (80.7 kg)      Other studies Reviewed: Additional studies/ records that were reviewed today include: Notes from cardiology 2014 Myovue and cath notes Notes from primary CXR and labs .    ASSESSMENT AND PLAN:  1.  CAD:  Complex LAD disease 7 years ago on cath  myovue suggests that LAD is now occluded Cath Indicated to define anatomy in setting of newly diagnosed severe LV dysfunction Risks including dye reaction Stroke, MI, bleeding and need for emergency surgery discussed Willing to proceed. Labs ordered cath lab called 2. HLD:  Continue statin labs with primary discussed low carb low fat diet 3. DM:  Discussed low carb diet.  Target hemoglobin A1c is 6.5 or less.  Continue current medications. 4. HTN:  Well controlled.  Continue current medications and low sodium Dash type diet.   5. Emphysema  Quit smoking CXR January 2019 ok no active wheezing  6. DCM:  Ischemic start low dose entersto and coreg right heart cath with coronary angiography   Current medicines are reviewed at length with the patient today.  The patient does not have concerns regarding medicines.  The following changes have been made:  Entresto/coreg  Labs/ tests ordered today include: right and left cath   Orders Placed This Encounter  Procedures  . Basic metabolic panel  .  CBC with Differential/Platelet  . EKG 12-Lead   Time spent discussing diagnosis, new medication and need for cath as well as orders and arranging Right and left cath 45 minutes   Disposition:   FU with me post cath     Signed, Jenkins Rouge, MD  01/18/2019 10:16 AM    [address], [place], [place]  [address] Phone: [phone]; Fax: [phone]

## 2019-01-14 NOTE — Progress Notes (Signed)
Cardiology Office Note   Date:  01/18/2019   ID:  Nathan Fernandez, DOB [birth year], MRN [id number]  PCP:  Tonia Ghent, MD  Cardiologist:   Jenkins Rouge, MD   No chief complaint on file.     History of Present Illness: Nathan Fernandez is a 78 y.o. male  First seen 12/09/18  regarding CAD. 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 Myovue done 04/07/13  With apical infarct and inferior wall ischemia Cath by CM 04/18/13 severe aneurysmal disease LAD 50% lesions with small D1 70% eminating from aneurysmal segment No significant disease in RCA/Circumflex EF 50% Medical Rx recommended   CRF;s include DM, HTN, HLD long smoking history but has quit  CXR 12/29/17 emphysema no acute lesions  Poor diet has lots of cookies A1c 9.2 Triglycerides 347 and LDL 66 on statin   Walks daily no angina   Myovue done 01/12/19 abnormal EF estimated at 32% with large anterior wall MI Deemed high risk due to low EF Returns to discuss medication changes and cath  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   Risks including stroke bleeding contrast reaction and need for emergency surgery discussed willing to proceed   Past Medical History:  Diagnosis Date  . 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   Hosp R facial weakness  . CAD (coronary artery disease)    MI, [place], PTCA; [place] 04/18/13: Distal left main 40%, proximal LAD with several aneurysmal segments, proximal LAD 50% prior to and after aneurysmal segments, area does not appear to flow-limiting, proximal diagonal 70%, ostial circumflex 20%, proximal circumflex 20%, OM1 30-40%, proximal RCA 40%, mid RCA 30%, distal RCA 20%, EF 50% =>  med Rx  . COPD (chronic obstructive pulmonary disease) ([place])   . DM2 (diabetes mellitus, type 2) ([place])   . History of ETT 1998   wnl  . History of MRI 08/04/09   brain- atrophy sm vess dz  . HLD (hyperlipidemia)   . HTN (hypertension)   . OA (osteoarthritis)     Past Surgical History:  Procedure Laterality Date  . cataract surgery  09/06   repair lens which moved  . stress myoview  06/29/06   sm distal anteroseptal & apical infarct  . THYROIDECTOMY, PARTIAL  1967   B9 growth     Current Outpatient Medications  Medication Sig Dispense Refill  . albuterol (PROVENTIL HFA;VENTOLIN HFA) 108 (90 Base) MCG/ACT inhaler Inhale into the lungs every 6 (six) hours as needed for wheezing or shortness of breath.    Marland Kitchen aspirin 81 MG EC tablet Take 81 mg by mouth daily.      . benzonatate (TESSALON) 200 MG capsule Take 1 capsule (200 mg total) by mouth 3 (three) times daily as needed for cough. 30 capsule 1  . buPROPion (WELLBUTRIN SR) 150 MG 12 hr tablet TAKE 1 TABLET BY MOUTH TWICE A DAY 180 tablet 3  . cyclobenzaprine (FLEXERIL) 5 MG tablet TAKE 1-2 TABLETS BY MOUTH 3 TIMES DAILY AS NEEDED FOR MUSCLE SPASMS (Patient taking differently: Take 5-10 mg by mouth three times a day as needed for muscle spasms) 90 tablet 1  . gemfibrozil (LOPID) 600 MG tablet  TAKE 1 TABLET BY MOUTH TWICE A DAY 180 tablet 3  . ibuprofen (ADVIL,MOTRIN) 200 MG tablet Take 400 mg by mouth every 6 (six) hours as needed (for pain).    . indomethacin (INDOCIN) 50 MG capsule TAKE ONE CAPSULE BY MOUTH TWICE A DAY AS NEEDED WITH A MEAL FOR GOUT (Patient taking differently: Take 50 mg by mouth 2 (two) times daily as needed (for gout/take with food). ) 30 capsule 0  . ketoconazole (NIZORAL) 2 % cream Apply 1 application topically 2 (two) times daily as needed for irritation. 15 g 1  . meclizine (ANTIVERT) 12.5 MG tablet Take 1-2 tablets (12.5-25 mg total) by mouth 3 (three) times daily as needed for dizziness.    . Multiple Vitamin  (MULTIVITAMIN) tablet Take 1 tablet by mouth daily.      . nitroGLYCERIN (NITROSTAT) 0.4 MG SL tablet 0.4 mg sublingually at onset of chest pain; may repeat in 5 minutes if no relief 25 tablet 12  . Omega-3 Fatty Acids (FISH OIL) 1000 MG CAPS Take 1,000 mg by mouth daily.     . pravastatin (PRAVACHOL) 40 MG tablet TAKE 1 TABLET BY MOUTH EVERYDAY AT BEDTIME 90 tablet 2  . ranitidine (ZANTAC) 150 MG tablet Take 1 tablet (150 mg total) by mouth 2 (two) times daily.    . tamsulosin (FLOMAX) 0.4 MG CAPS capsule TAKE 2 CAPSULES BY MOUTH EVERY DAY 180 capsule 3  . traZODone (DESYREL) 100 MG tablet TAKE 2 TABLETS BY MOUTH AT BEDTIME 180 tablet 1   No current facility-administered medications for this visit.     Allergies:   Ace inhibitors; Angiotensin receptor blockers; and Enalapril    Social History:  The patient  reports that he has quit smoking. His smoking use included cigars. He has never used smokeless tobacco. He reports current alcohol use. He reports that he does not use drugs.   Family History:  The patient's family history includes Aneurysm in his mother; Heart disease in his father and son; Hypertension in his father and mother; Stroke in his mother.    ROS:  Please see the history of present illness.   Otherwise, review of systems are positive for none.   All other systems are reviewed and negative.    PHYSICAL EXAM: VS:  BP 126/84   Pulse 93   Ht 5' 6.5" (1.689 m)   Wt 184 lb (83.5 kg)   BMI 29.25 kg/m  , BMI Body mass index is 29.25 kg/m. Affect appropriate Healthy:  appears stated age 23: normal Neck supple with no adenopathy JVP normal no bruits no thyromegaly Lungs clear with no wheezing and good diaphragmatic motion Heart:  S1/S2 no murmur, no rub, gallop or click PMI normal Abdomen: benighn, BS positve, no tenderness, no AAA no bruit.  No HSM or HJR Distal pulses intact with no bruits No edema Neuro non-focal mild tremor in hands  Skin warm and dry No  muscular weakness    EKG:  SR RBBB rate 82  12/30/17 12/10/18 SR rate 87 LAD RBBB  01/18/19 SR RBBB septal infarct    Recent Labs: 08/24/2018: ALT 15; BUN 16; Creatinine, Ser 0.88; Potassium 4.2; Sodium 135 08/31/2018: Hemoglobin 16.4; Platelets 176.0; TSH 2.39    Lipid Panel    Component Value Date/Time   CHOL 126 08/24/2018 1017   TRIG 347.0 (H) 08/24/2018 1017   HDL 31.10 (L) 08/24/2018 1017   CHOLHDL 4 08/24/2018 1017   VLDL 69.4 (H) 08/24/2018 1017   LDLCALC 103 (  H) 02/16/2017 1130   LDLDIRECT 66.0 08/24/2018 1017      Wt Readings from Last 3 Encounters:  01/18/19 184 lb (83.5 kg)  01/12/19 178 lb (80.7 kg)  12/10/18 178 lb (80.7 kg)      Other studies Reviewed: Additional studies/ records that were reviewed today include: Notes from cardiology 2014 Myovue and cath notes Notes from primary CXR and labs .    ASSESSMENT AND PLAN:  1.  CAD:  Complex LAD disease 7 years ago on cath  myovue suggests that LAD is now occluded Cath Indicated to define anatomy in setting of newly diagnosed severe LV dysfunction Risks including dye reaction Stroke, MI, bleeding and need for emergency surgery discussed Willing to proceed. Labs ordered cath lab called 2. HLD:  Continue statin labs with primary discussed low carb low fat diet 3. DM:  Discussed low carb diet.  Target hemoglobin A1c is 6.5 or less.  Continue current medications. 4. HTN:  Well controlled.  Continue current medications and low sodium Dash type diet.   5. Emphysema  Quit smoking CXR January 2019 ok no active wheezing  6. DCM:  Ischemic start low dose entersto and coreg right heart cath with coronary angiography   Current medicines are reviewed at length with the patient today.  The patient does not have concerns regarding medicines.  The following changes have been made:  Entresto/coreg  Labs/ tests ordered today include: right and left cath   Orders Placed This Encounter  Procedures  . Basic metabolic panel  .  CBC with Differential/Platelet  . EKG 12-Lead   Time spent discussing diagnosis, new medication and need for cath as well as orders and arranging Right and left cath 45 minutes   Disposition:   FU with me post cath     Signed, Jenkins Rouge, MD  01/18/2019 10:16 AM    [address], [place], [place]  [address] Phone: [phone]; Fax: [phone]

## 2019-01-15 ENCOUNTER — Other Ambulatory Visit: Payer: Self-pay | Admitting: Family Medicine

## 2019-01-18 ENCOUNTER — Ambulatory Visit: Payer: Medicare Other | Admitting: Cardiovascular Disease

## 2019-01-18 ENCOUNTER — Other Ambulatory Visit (HOSPITAL_COMMUNITY): Payer: Self-pay | Admitting: Cardiovascular Disease

## 2019-01-18 VITALS — BP 126/84 | HR 93 | Ht 66.5 in | Wt 184.0 lb

## 2019-01-18 DIAGNOSIS — I10 Essential (primary) hypertension: Secondary | ICD-10-CM

## 2019-01-18 DIAGNOSIS — I2583 Coronary atherosclerosis due to lipid rich plaque: Secondary | ICD-10-CM

## 2019-01-18 DIAGNOSIS — I2510 Atherosclerotic heart disease of native coronary artery without angina pectoris: Secondary | ICD-10-CM | POA: Diagnosis not present

## 2019-01-18 DIAGNOSIS — I420 Dilated cardiomyopathy: Secondary | ICD-10-CM

## 2019-01-18 DIAGNOSIS — E785 Hyperlipidemia, unspecified: Secondary | ICD-10-CM

## 2019-01-18 LAB — CBC WITH DIFFERENTIAL/PLATELET
Basophils Absolute: 0.1 10*3/uL (ref 0.0–0.2)
Basos: 1 %
EOS (ABSOLUTE): 0.4 10*3/uL (ref 0.0–0.4)
Eos: 6 %
HEMATOCRIT: 47.6 % (ref 37.5–51.0)
Hemoglobin: 17.1 g/dL (ref 13.0–17.7)
Immature Grans (Abs): 0 10*3/uL (ref 0.0–0.1)
Immature Granulocytes: 0 %
Lymphocytes Absolute: 2.6 10*3/uL (ref 0.7–3.1)
Lymphs: 38 %
MCH: 31.6 pg (ref 26.6–33.0)
MCHC: 35.9 g/dL — ABNORMAL HIGH (ref 31.5–35.7)
MCV: 88 fL (ref 79–97)
Monocytes Absolute: 0.6 10*3/uL (ref 0.1–0.9)
Monocytes: 9 %
Neutrophils Absolute: 3.1 10*3/uL (ref 1.4–7.0)
Neutrophils: 46 %
Platelets: 200 10*3/uL (ref 150–450)
RBC: 5.41 x10E6/uL (ref 4.14–5.80)
RDW: 12.6 % (ref 11.6–15.4)
WBC: 6.8 10*3/uL (ref 3.4–10.8)

## 2019-01-18 LAB — BASIC METABOLIC PANEL
BUN/Creatinine Ratio: 22 (ref 10–24)
BUN: 20 mg/dL (ref 8–27)
CO2: 22 mmol/L (ref 20–29)
CREATININE: 0.91 mg/dL (ref 0.76–1.27)
Calcium: 10 mg/dL (ref 8.6–10.2)
Chloride: 97 mmol/L (ref 96–106)
GFR calc Af Amer: 93 mL/min/{1.73_m2} (ref >59)
GFR calc non Af Amer: 80 mL/min/{1.73_m2} (ref >59)
GLUCOSE: 175 mg/dL — AB (ref 65–99)
Potassium: 4.5 mmol/L (ref 3.5–5.2)
Sodium: 136 mmol/L (ref 134–144)

## 2019-01-18 NOTE — Patient Instructions (Addendum)
Medication Instructions:   If you need a refill on your cardiac medications before your next appointment, please call your pharmacy.   Lab work: Your physician recommends that you have lab work today- BMET and CBC.  If you have labs (blood work) drawn today and your tests are completely normal, you will receive your results only by: Marland Kitchen MyChart Message (if you have MyChart) OR . A paper copy in the mail If you have any lab test that is abnormal or we need to change your treatment, we will call you to review the results.  Testing/Procedures: Your physician has requested that you have a cardiac catheterization. Cardiac catheterization is used to diagnose and/or treat various heart conditions. Doctors may recommend this procedure for a number of different reasons. The most common reason is to evaluate chest pain. Chest pain can be a symptom of coronary artery disease (CAD), and cardiac catheterization can show whether plaque is narrowing or blocking your heart's arteries. This procedure is also used to evaluate the valves, as well as measure the blood flow and oxygen levels in different parts of your heart. For further information please visit [email or web address]. Please follow instruction sheet, as given.  Follow-Up: At [facility], you and your health needs are our priority.  As part of our continuing mission to provide you with exceptional heart care, we have created designated Provider Care Teams.  These Care Teams include your primary Cardiologist (physician) and Advanced Practice Providers (APPs -  Physician Assistants and Nurse Practitioners) who all work together to provide you with the care you need, when you need it. You will need a follow up appointment in 2 weeks. You may see Jenkins Rouge, MD or one of the following Advanced Practice Providers on your designated Care Team:   Truitt Merle, NP Cecilie Kicks, NP . Kathyrn Drown, NP      [place] OFFICE [place], [place] [address] Dept: [phone] Loc: [place]  01/18/2019  You are scheduled for a Cardiac Catheterization on Friday, January 31 with Dr. [name].  1. Please arrive at the [facility] (Main Entrance A) at [facility]: [address] [place], [place] [address] at 5:30 AM (This time is two hours before your procedure to ensure your preparation). Free valet parking service is available.   Special note: Every effort is made to have your procedure done on time. Please understand that emergencies sometimes delay scheduled procedures.  2. Diet: Do not eat solid foods after midnight.  The patient may have clear liquids until 5am upon the day of the procedure.  3. Labs: You will need to have blood drawn on Tuesday, January 28 at [facility] at [facility]. [address]  Open: 7:30am - 5pm    Phone: [phone]. You do not need to be fasting.  4. Medication instructions in preparation for your procedure:   Contrast Allergy: No  On the morning of your procedure, take your Aspirin and any morning medicines NOT listed above.  You may use sips of water.  5. Plan for one night stay--bring personal belongings. 6. Bring a current list of your medications and current insurance cards. 7. You MUST have a responsible person to drive you home. 8. Someone MUST be with you the first 24 hours after you arrive home or your discharge will be delayed. 9. Please wear clothes that are easy  to get on and off and wear slip-on shoes.  Thank you for allowing [place] to care for you!   -- [place] [facility]

## 2019-01-20 ENCOUNTER — Telehealth: Payer: Self-pay | Admitting: *Deleted

## 2019-01-20 NOTE — Telephone Encounter (Signed)
Pt contacted pre-catheterization scheduled at [facility] for: Friday January 21, 2019 7:30 AM Verified arrival time and place: [place] Entrance A at: 5:30 AM  No solid food after midnight prior to cath, clear liquids until 5 AM day of procedure. Contrast allergy: no Verified no diabetes medications: no  AM meds can be  taken pre-cath with sip of water including: ASA 81 mg  Confirmed patient has responsible person to drive home post procedure and observe for 24 hours after you arrive home:

## 2019-01-21 ENCOUNTER — Other Ambulatory Visit: Payer: Self-pay

## 2019-01-21 ENCOUNTER — Encounter (HOSPITAL_COMMUNITY): Payer: Self-pay | Admitting: Cardiovascular Disease

## 2019-01-21 ENCOUNTER — Encounter (HOSPITAL_COMMUNITY)
Admission: RE | Disposition: A | Discharge status: Home / Self Care | Payer: Self-pay | Source: Home / Self Care | Attending: Cardiovascular Disease

## 2019-01-21 ENCOUNTER — Ambulatory Visit (HOSPITAL_COMMUNITY)
Admission: RE | Admit: 2019-01-21 | Discharge: 2019-01-21 | Disposition: A | Discharge status: Home / Self Care | Payer: Medicare Other | Attending: Cardiovascular Disease | Admitting: Cardiovascular Disease

## 2019-01-21 DIAGNOSIS — Z8249 Family history of ischemic heart disease and other diseases of the circulatory system: Secondary | ICD-10-CM | POA: Insufficient documentation

## 2019-01-21 DIAGNOSIS — E119 Type 2 diabetes mellitus without complications: Secondary | ICD-10-CM | POA: Insufficient documentation

## 2019-01-21 DIAGNOSIS — J449 Chronic obstructive pulmonary disease, unspecified: Secondary | ICD-10-CM | POA: Insufficient documentation

## 2019-01-21 DIAGNOSIS — I252 Old myocardial infarction: Secondary | ICD-10-CM | POA: Insufficient documentation

## 2019-01-21 DIAGNOSIS — Z823 Family history of stroke: Secondary | ICD-10-CM | POA: Insufficient documentation

## 2019-01-21 DIAGNOSIS — Z79899 Other long term (current) drug therapy: Secondary | ICD-10-CM | POA: Insufficient documentation

## 2019-01-21 DIAGNOSIS — G510 Bell's palsy: Secondary | ICD-10-CM | POA: Diagnosis not present

## 2019-01-21 DIAGNOSIS — M1990 Unspecified osteoarthritis, unspecified site: Secondary | ICD-10-CM | POA: Insufficient documentation

## 2019-01-21 DIAGNOSIS — Z7982 Long term (current) use of aspirin: Secondary | ICD-10-CM | POA: Insufficient documentation

## 2019-01-21 DIAGNOSIS — Z888 Allergy status to other drugs, medicaments and biological substances status: Secondary | ICD-10-CM | POA: Insufficient documentation

## 2019-01-21 DIAGNOSIS — R9439 Abnormal result of other cardiovascular function study: Secondary | ICD-10-CM | POA: Diagnosis not present

## 2019-01-21 DIAGNOSIS — I10 Essential (primary) hypertension: Secondary | ICD-10-CM | POA: Insufficient documentation

## 2019-01-21 DIAGNOSIS — Z87891 Personal history of nicotine dependence: Secondary | ICD-10-CM | POA: Diagnosis not present

## 2019-01-21 DIAGNOSIS — I25119 Atherosclerotic heart disease of native coronary artery with unspecified angina pectoris: Secondary | ICD-10-CM | POA: Diagnosis not present

## 2019-01-21 DIAGNOSIS — E785 Hyperlipidemia, unspecified: Secondary | ICD-10-CM | POA: Diagnosis not present

## 2019-01-21 HISTORY — PX: LEFT HEART CATH AND CORONARY ANGIOGRAPHY: CATH118249

## 2019-01-21 LAB — GLUCOSE, CAPILLARY: Glucose-Capillary: 162 mg/dL — ABNORMAL HIGH (ref 70–99)

## 2019-01-21 SURGERY — LEFT HEART CATH AND CORONARY ANGIOGRAPHY
Anesthesia: LOCAL

## 2019-01-21 MED ORDER — HEPARIN SODIUM (PORCINE) 1000 UNIT/ML IJ SOLN
INTRAMUSCULAR | Status: DC | PRN
  Administered 2019-01-21: 4500 [IU] via INTRAVENOUS

## 2019-01-21 MED ORDER — FENTANYL CITRATE (PF) 100 MCG/2ML IJ SOLN
INTRAMUSCULAR | Status: DC | PRN
  Administered 2019-01-21: 25 ug via INTRAVENOUS

## 2019-01-21 MED ORDER — LIDOCAINE HCL (PF) 1 % IJ SOLN
INTRAMUSCULAR | Status: DC | PRN
  Administered 2019-01-21: 2 mL

## 2019-01-21 MED ORDER — HEPARIN (PORCINE) IN NACL 1000-0.9 UT/500ML-% IV SOLN
INTRAVENOUS | Status: DC | PRN
  Administered 2019-01-21 (×2): 500 mL

## 2019-01-21 MED ORDER — HEPARIN (PORCINE) IN NACL 1000-0.9 UT/500ML-% IV SOLN
INTRAVENOUS | Status: AC
  Filled 2019-01-21: qty 500

## 2019-01-21 MED ORDER — ACETAMINOPHEN 325 MG PO TABS: 650.0000 mg | ORAL_TABLET | ORAL | Status: DC | PRN

## 2019-01-21 MED ORDER — IOHEXOL 350 MG/ML SOLN
INTRAVENOUS | Status: DC | PRN
  Administered 2019-01-21: 100 mL via INTRAVENOUS

## 2019-01-21 MED ORDER — SODIUM CHLORIDE 0.9 % IV SOLN: INTRAVENOUS | Status: AC

## 2019-01-21 MED ORDER — VERAPAMIL HCL 2.5 MG/ML IV SOLN
INTRAVENOUS | Status: AC
  Filled 2019-01-21: qty 2

## 2019-01-21 MED ORDER — MIDAZOLAM HCL 2 MG/2ML IJ SOLN
INTRAMUSCULAR | Status: DC | PRN
  Administered 2019-01-21: 1 mg via INTRAVENOUS

## 2019-01-21 MED ORDER — SODIUM CHLORIDE 0.9% FLUSH: 3.0000 mL | Freq: Two times a day (BID) | INTRAVENOUS | Status: DC

## 2019-01-21 MED ORDER — MIDAZOLAM HCL 2 MG/2ML IJ SOLN
INTRAMUSCULAR | Status: AC
  Filled 2019-01-21: qty 2

## 2019-01-21 MED ORDER — FENTANYL CITRATE (PF) 100 MCG/2ML IJ SOLN
INTRAMUSCULAR | Status: AC
  Filled 2019-01-21: qty 2

## 2019-01-21 MED ORDER — SODIUM CHLORIDE 0.9% FLUSH: 3.0000 mL | INTRAVENOUS | Status: DC | PRN

## 2019-01-21 MED ORDER — SODIUM CHLORIDE 0.9 % IV SOLN: 250.0000 mL | INTRAVENOUS | Status: DC | PRN

## 2019-01-21 MED ORDER — ASPIRIN 81 MG PO CHEW: 81.0000 mg | CHEWABLE_TABLET | ORAL | Status: DC

## 2019-01-21 MED ORDER — SODIUM CHLORIDE 0.9 % IV SOLN
INTRAVENOUS | Status: DC
  Administered 2019-01-21: 07:00:00 via INTRAVENOUS

## 2019-01-21 MED ORDER — HEPARIN SODIUM (PORCINE) 1000 UNIT/ML IJ SOLN
INTRAMUSCULAR | Status: AC
  Filled 2019-01-21: qty 1

## 2019-01-21 MED ORDER — HEPARIN (PORCINE) IN NACL 1000-0.9 UT/500ML-% IV SOLN
INTRAVENOUS | Status: AC
  Filled 2019-01-21: qty 1000

## 2019-01-21 MED ORDER — ONDANSETRON HCL 4 MG/2ML IJ SOLN: 4.0000 mg | Freq: Four times a day (QID) | INTRAMUSCULAR | Status: DC | PRN

## 2019-01-21 MED ORDER — LIDOCAINE HCL (PF) 1 % IJ SOLN
INTRAMUSCULAR | Status: AC
  Filled 2019-01-21: qty 30

## 2019-01-21 MED ORDER — VERAPAMIL HCL 2.5 MG/ML IV SOLN
INTRAVENOUS | Status: DC | PRN
  Administered 2019-01-21: 10 mL via INTRA_ARTERIAL

## 2019-01-21 SURGICAL SUPPLY — 11 items
CATH 5FR JL3.5 JR4 ANG PIG MP (CATHETERS) ×1 IMPLANT
DEVICE RAD COMP TR BAND LRG (VASCULAR PRODUCTS) ×1 IMPLANT
GLIDESHEATH SLEND SS 6F .021 (SHEATH) ×1 IMPLANT
GUIDEWIRE INQWIRE 1.5J.035X260 (WIRE) IMPLANT
INQWIRE 1.5J .035X260CM (WIRE) ×2
KIT ENCORE 26 ADVANTAGE (KITS) IMPLANT
KIT HEART LEFT (KITS) ×2 IMPLANT
PACK CARDIAC CATHETERIZATION (CUSTOM PROCEDURE TRAY) ×2 IMPLANT
SYR MEDRAD MARK 7 150ML (SYRINGE) ×2 IMPLANT
TRANSDUCER W/STOPCOCK (MISCELLANEOUS) ×2 IMPLANT
TUBING CIL FLEX 10 FLL-RA (TUBING) ×2 IMPLANT

## 2019-01-21 NOTE — Discharge Instructions (Signed)
Radial Site Care ° °This sheet gives you information about how to care for yourself after your procedure. Your health care provider may also give you more specific instructions. If you have problems or questions, contact your health care provider. °What can I expect after the procedure? °After the procedure, it is common to have: °· Bruising and tenderness at the catheter insertion area. °Follow these instructions at home: °Medicines °· Take over-the-counter and prescription medicines only as told by your health care provider. °Insertion site care °· Follow instructions from your health care provider about how to take care of your insertion site. Make sure you: °? Wash your hands with soap and water before you change your bandage (dressing). If soap and water are not available, use hand sanitizer. °? Change your dressing as told by your health care provider. °? Leave stitches (sutures), skin glue, or adhesive strips in place. These skin closures may need to stay in place for 2 weeks or longer. If adhesive strip edges start to loosen and curl up, you may trim the loose edges. Do not remove adhesive strips completely unless your health care provider tells you to do that. °· Check your insertion site every day for signs of infection. Check for: °? Redness, swelling, or pain. °? Fluid or blood. °? Pus or a bad smell. °? Warmth. °· Do not take baths, swim, or use a hot tub until your health care provider approves. °· You may shower 24-48 hours after the procedure, or as directed by your health care provider. °? Remove the dressing and gently wash the site with plain soap and water. °? Pat the area dry with a clean towel. °? Do not rub the site. That could cause bleeding. °· Do not apply powder or lotion to the site. °Activity ° °· For 24 hours after the procedure, or as directed by your health care provider: °? Do not flex or bend the affected arm. °? Do not push or pull heavy objects with the affected arm. °? Do not  drive yourself home from the hospital or clinic. You may drive 24 hours after the procedure unless your health care provider tells you not to. °? Do not operate machinery or power tools. °· Do not lift anything that is heavier than 10 lb (4.5 kg), or the limit that you are told, until your health care provider says that it is safe. °· Ask your health care provider when it is okay to: °? Return to work or school. °? Resume usual physical activities or sports. °? Resume sexual activity. °General instructions °· If the catheter site starts to bleed, raise your arm and put firm pressure on the site. If the bleeding does not stop, get help right away. This is a medical emergency. °· If you went home on the same day as your procedure, a responsible adult should be with you for the first 24 hours after you arrive home. °· Keep all follow-up visits as told by your health care provider. This is important. °Contact a health care provider if: °· You have a fever. °· You have redness, swelling, or yellow drainage around your insertion site. °Get help right away if: °· You have unusual pain at the radial site. °· The catheter insertion area swells very fast. °· The insertion area is bleeding, and the bleeding does not stop when you hold steady pressure on the area. °· Your arm or hand becomes pale, cool, tingly, or numb. °These symptoms may represent a serious problem   that is an emergency. Do not wait to see if the symptoms will go away. Get medical help right away. Call your local emergency services (911 in the [place] Do not drive yourself to the hospital. °Summary °· After the procedure, it is common to have bruising and tenderness at the site. °· Follow instructions from your health care provider about how to take care of your radial site wound. Check the wound every day for signs of infection. °· Do not lift anything that is heavier than 10 lb (4.5 kg), or the limit that you are told, until your health care provider says  that it is safe. °This information is not intended to replace advice given to you by your health care provider. Make sure you discuss any questions you have with your health care provider. °Document Released: 01/10/2011 Document Revised: 01/13/2018 Document Reviewed: 01/13/2018 °Elsevier Interactive Patient Education © 2019 [employer] ° °

## 2019-01-21 NOTE — Interval H&P Note (Signed)
History and Physical Interval Note:  01/21/2019 7:16 AM  Nathan Fernandez  has presented today for surgery, with the diagnosis of abnormal myoview  The various methods of treatment have been discussed with the patient and family. After consideration of risks, benefits and other options for treatment, the patient has consented to  Procedure(s): LEFT HEART CATH AND CORONARY ANGIOGRAPHY (N/A) as a surgical intervention .  The patient's history has been reviewed, patient examined, no change in status, stable for surgery.  I have reviewed the patient's chart and labs.  Questions were answered to the patient's satisfaction.    Cath Lab Visit (complete for each Cath Lab visit)  Clinical Evaluation Leading to the Procedure:   ACS: No.  Non-ACS:    Anginal Classification: CCS II  Anti-ischemic medical therapy: Minimal Therapy (1 class of medications)  Non-Invasive Test Results: High-risk stress test findings: cardiac mortality >3%/year  Prior CABG: No previous CABG         Nathan Fernandez

## 2019-01-24 MED FILL — Lidocaine HCl Local Preservative Free (PF) Inj 1%: INTRAMUSCULAR | Qty: 30 | Status: AC

## 2019-02-08 NOTE — Progress Notes (Signed)
Cardiology Office Note   Date:  02/11/2019   ID:  Nathan Fernandez, DOB 11/12/1941, MRN [id number]  PCP:  Tonia Ghent, MD  Cardiologist:   Jenkins Rouge, MD   No chief complaint on file.     History of Present Illness:  78 y.o. f/u for CAD. CRF;s include DM, HTN , HLD, and previous smoker. Distant MI in 28's. First cath in [place] 2014 with moderate aneurysmal dx in mid LAD and 70% D1 Rx medically  Myovue done 01/12/19 abnormal EF estimated at 32% with large anterior wall MI Deemed high risk due to low EF   Cath 01/21/19 reviewed no real changes from 2014 aneurysmal 50% LAD and 70% D1 EF estimated 35-40% With anteroapical RWMA   He has been active doing Architect work on Doctor, hospital Unable to take ACE/ARB/Entresto due to angioedema Will likely use beta blocker and nitrates post cath.   No angina doing well Discussed optimizing meds with beta blocker and nitrates Reassess EF in 6 months TTE/MRI    Past Medical History:  Diagnosis Date  . 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   Hosp R facial weakness  . CAD (coronary artery disease)    MI, [place], [facility]; [place] 04/18/13: Distal left main 40%, proximal LAD with several aneurysmal segments, proximal LAD 50% prior to and after aneurysmal segments, area does not appear to flow-limiting, proximal diagonal 70%, ostial circumflex 20%, proximal circumflex 20%, OM1 30-40%, proximal RCA 40%, mid RCA 30%, distal RCA 20%, EF 50% => med Rx  . COPD (chronic obstructive pulmonary disease) ([facility])   . DM2 (diabetes mellitus, type 2) ([place])   . History of ETT 1998   wnl  . History of MRI 08/04/09   brain- atrophy sm vess dz  . HLD (hyperlipidemia)   . HTN (hypertension)   . OA (osteoarthritis)     Past Surgical History:  Procedure Laterality Date  . cataract surgery  09/06   repair lens which moved  . LEFT HEART CATH AND CORONARY ANGIOGRAPHY N/A 01/21/2019   Procedure: LEFT HEART CATH AND CORONARY ANGIOGRAPHY;  Surgeon: Burnell Blanks,  MD;  Location: [facility];  Service: Cardiovascular;  Laterality: N/A;  . stress myoview  06/29/06   sm distal anteroseptal & apical infarct  . THYROIDECTOMY, PARTIAL  1967   B9 growth     Current Outpatient Medications  Medication Sig Dispense Refill  . aspirin 81 MG EC tablet Take 81 mg by mouth daily.      Marland Kitchen buPROPion (WELLBUTRIN SR) 150 MG 12 hr tablet TAKE 1 TABLET BY MOUTH TWICE A DAY (Patient taking differently: Take 150 mg by mouth 2 (two) times daily. TAKE 1 TABLET BY MOUTH TWICE A DAY) 180 tablet 3  . cyclobenzaprine (FLEXERIL) 5 MG tablet TAKE 1-2 TABLETS BY MOUTH 3 TIMES DAILY AS NEEDED FOR MUSCLE SPASMS (Patient taking differently: Take 5 mg by mouth 3 (three) times daily as needed for muscle spasms. ) 90 tablet 1  . gemfibrozil (LOPID) 600 MG tablet TAKE 1 TABLET BY MOUTH TWICE A DAY (Patient taking differently: Take 600 mg by mouth 2 (two) times daily before a meal. ) 180 tablet 3  . ibuprofen (ADVIL,MOTRIN) 200 MG tablet Take 400 mg by mouth every 6 (six) hours as needed (for pain).    . indomethacin (INDOCIN) 50 MG capsule TAKE ONE CAPSULE BY MOUTH TWICE A DAY AS NEEDED WITH A MEAL FOR GOUT (Patient taking differently: Take 50 mg by mouth 2 (  two) times daily as needed (for gout/take with food). ) 30 capsule 0  . ketoconazole (NIZORAL) 2 % cream Apply 1 application topically 2 (two) times daily as needed for irritation. 15 g 1  . meclizine (ANTIVERT) 12.5 MG tablet Take 1-2 tablets (12.5-25 mg total) by mouth 3 (three) times daily as needed for dizziness.    . nitroGLYCERIN (NITROSTAT) 0.4 MG SL tablet Place 1 tablet (0.4 mg total) under the tongue every 5 (five) minutes as needed for chest pain. Up to 3 doses 25 tablet 3  . Omega-3 Fatty Acids (FISH OIL) 1000 MG CAPS Take 1,000 mg by mouth daily.     . pravastatin (PRAVACHOL) 40 MG tablet TAKE 1 TABLET BY MOUTH EVERYDAY AT BEDTIME (Patient taking differently: Take 40 mg by mouth at bedtime. ) 90 tablet 2  . tamsulosin  (FLOMAX) 0.4 MG CAPS capsule TAKE 2 CAPSULES BY MOUTH EVERY DAY (Patient taking differently: Take 0.8 mg by mouth daily. ) 180 capsule 3  . traZODone (DESYREL) 100 MG tablet TAKE 2 TABLETS BY MOUTH AT BEDTIME (Patient taking differently: Take 200 mg by mouth at bedtime. ) 180 tablet 1  . vitamin E 1000 UNIT capsule Take 1,000 Units by mouth daily.    . carvedilol (COREG) 6.25 MG tablet Take 1 tablet (6.25 mg total) by mouth 2 (two) times daily. 180 tablet 3  . isosorbide mononitrate (IMDUR) 30 MG 24 hr tablet Take 1 tablet (30 mg total) by mouth daily. 90 tablet 3   No current facility-administered medications for this visit.     Allergies:   Ace inhibitors; Angiotensin receptor blockers; and Enalapril    Social History:  The patient  reports that he has quit smoking. His smoking use included cigars. He has never used smokeless tobacco. He reports current alcohol use. He reports that he does not use drugs.   Family History:  The patient's family history includes Aneurysm in his mother; Heart disease in his father and son; Hypertension in his father and mother; Stroke in his mother.    ROS:  Please see the history of present illness.   Otherwise, review of systems are positive for none.   All other systems are reviewed and negative.    PHYSICAL EXAM: VS:  BP (!) 142/90   Pulse 88   Ht 5\' 9"  (1.753 m)   Wt 84.6 kg   SpO2 93%   BMI 27.53 kg/m  , BMI Body mass index is 27.53 kg/m. Affect appropriate Healthy:  appears stated age 42: normal Neck supple with no adenopathy JVP normal no bruits no thyromegaly Lungs clear with no wheezing and good diaphragmatic motion Heart:  S1/S2 no murmur, no rub, gallop or click PMI normal Abdomen: benighn, BS positve, no tenderness, no AAA no bruit.  No HSM or HJR Distal pulses intact with no bruits No edema Neuro non-focal mild tremor in hands  Skin warm and dry No muscular weakness    EKG:  SR RBBB rate 82  12/30/17 12/10/18 SR rate 87  LAD RBBB  01/18/19 SR RBBB septal infarct    Recent Labs: 08/24/2018: ALT 15 08/31/2018: TSH 2.39 01/18/2019: BUN 20; Creatinine, Ser 0.91; Hemoglobin 17.1; Platelets 200; Potassium 4.5; Sodium 136    Lipid Panel    Component Value Date/Time   CHOL 126 08/24/2018 1017   TRIG 347.0 (H) 08/24/2018 1017   HDL 31.10 (L) 08/24/2018 1017   CHOLHDL 4 08/24/2018 1017   VLDL 69.4 (H) 08/24/2018 1017   LDLCALC 103 (  H) 02/16/2017 1130   LDLDIRECT 66.0 08/24/2018 1017      Wt Readings from Last 3 Encounters:  02/11/19 84.6 kg  01/21/19 83.5 kg  01/18/19 83.5 kg      Other studies Reviewed: Additional studies/ records that were reviewed today include: Notes from cardiology 2014 Myovue and cath notes Notes from primary CXR and labs .    ASSESSMENT AND PLAN:  1.  CAD:  Moderate aneurysmal mid LAD and 70% D1 stable by cath 01/21/19 continue medical Rx start coreg 6.25 bid and imdur 15 mg daily  2. HLD:  Continue statin labs with primary discussed low carb low fat diet 3. DM:  Discussed low carb diet.  Target hemoglobin A1c is 6.5 or less.  Continue current medications. 4. HTN:  Well controlled.  Continue current medications and low sodium Dash type diet.   5. 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  The patient does not have concerns regarding medicines.  The following changes have been made:   Labs/ tests ordered today include:    No orders of the defined types were placed in this encounter.  Time spent discussing diagnosis, new medication and need for cath as well as orders and arranging Right and left cath 45 minutes   Disposition:   FU with me in a year     Signed, Jenkins Rouge, MD  02/11/2019 11:44 AM    [address], [place], [place]  [address] Phone: [phone]; Fax: [phone]

## 2019-02-09 ENCOUNTER — Ambulatory Visit: Payer: Medicare Other | Admitting: Cardiovascular Disease

## 2019-02-11 ENCOUNTER — Ambulatory Visit: Payer: Medicare Other | Admitting: Cardiovascular Disease

## 2019-02-11 ENCOUNTER — Encounter: Payer: Self-pay | Admitting: Cardiovascular Disease

## 2019-02-11 VITALS — BP 142/90 | HR 88 | Ht 69.0 in | Wt 186.4 lb

## 2019-02-11 DIAGNOSIS — I2510 Atherosclerotic heart disease of native coronary artery without angina pectoris: Secondary | ICD-10-CM | POA: Diagnosis not present

## 2019-02-11 MED ORDER — ISOSORBIDE MONONITRATE ER 30 MG PO TB24: 30.0000 mg | ORAL_TABLET | Freq: Every day | ORAL | 3 refills | Status: DC

## 2019-02-11 MED ORDER — NITROGLYCERIN 0.4 MG SL SUBL: 0.4000 mg | SUBLINGUAL_TABLET | SUBLINGUAL | 3 refills | Status: DC | PRN

## 2019-02-11 MED ORDER — CARVEDILOL 6.25 MG PO TABS: 6.2500 mg | ORAL_TABLET | Freq: Two times a day (BID) | ORAL | 3 refills | Status: DC

## 2019-02-11 MED ORDER — ISOSORBIDE MONONITRATE ER 30 MG PO TB24: 15.0000 mg | ORAL_TABLET | Freq: Every day | ORAL | 3 refills | Status: DC

## 2019-02-11 NOTE — Addendum Note (Signed)
Addended by: Aris Georgia, Saleem Coccia L on: 02/11/2019 11:51 AM   Modules accepted: Orders

## 2019-02-11 NOTE — Patient Instructions (Addendum)
Medication Instructions:  Your physician has recommended you make the following change in your medication:  1-START Carvedilol 6.25 mg by mouth twice daily 2-START Imdur 15 mg by mouth daily 3- Take 1 Nitroglycerin (NTG), under your tongue, while sitting. If no relief of pain may repeat NTG, one tab every 5 minutes up to 3 tablets total over 15 minutes. If no relief CALL 911. If you have dizziness/lightheadness while taking NTG, stop taking and call 911.  If you need a refill on your cardiac medications before your next appointment, please call your pharmacy.   Lab work:  If you have labs (blood work) drawn today and your tests are completely normal, you will receive your results only by: Marland Kitchen MyChart Message (if you have MyChart) OR . A paper copy in the mail If you have any lab test that is abnormal or we need to change your treatment, we will call you to review the results.  Testing/Procedures: None ordered today.  Follow-Up: At [facility], you and your health needs are our priority.  As part of our continuing mission to provide you with exceptional heart care, we have created designated Provider Care Teams.  These Care Teams include your primary Cardiologist (physician) and Advanced Practice Providers (APPs -  Physician Assistants and Nurse Practitioners) who all work together to provide you with the care you need, when you need it. You will need a follow up appointment in 6 months.  Please call our office 2 months in advance to schedule this appointment.  You may see Jenkins Rouge, MD or one of the following Advanced Practice Providers on your designated Care Team:   Truitt Merle, NP Cecilie Kicks, NP . Kathyrn Drown, NP   Isosorbide Mononitrate extended-release tablets What is this medicine? ISOSORBIDE MONONITRATE (eye soe SOR bide mon oh NYE trate) is a vasodilator. It relaxes blood vessels, increasing the blood and oxygen supply to your heart. This medicine is used to  prevent chest pain caused by angina. It will not help to stop an episode of chest pain. This medicine may be used for other purposes; ask your health care provider or pharmacist if you have questions. COMMON BRAND NAME(S): Imdur, Isotrate ER What should I tell my health care provider before I take this medicine? They need to know if you have any of these conditions: -previous heart attack or heart failure -an unusual or allergic reaction to isosorbide mononitrate, nitrates, other medicines, foods, dyes, or preservatives -pregnant or trying to get pregnant -breast-feeding How should I use this medicine? Take this medicine by mouth with a glass of water. Follow the directions on the prescription label. Do not crush or chew. Take your medicine at regular intervals. Do not take your medicine more often than directed. Do not stop taking this medicine except on the advice of your doctor or health care professional. Talk to your pediatrician regarding the use of this medicine in children. Special care may be needed. Overdosage: If you think you have taken too much of this medicine contact a poison control center or emergency room at once. NOTE: This medicine is only for you. Do not share this medicine with others. What if I miss a dose? If you miss a dose, take it as soon as you can. If it is almost time for your next dose, take only that dose. Do not take double or extra doses. What may interact with this medicine? Do not take this medicine with any of the following medications: -medicines used to  treat erectile dysfunction (ED) like avanafil, sildenafil, tadalafil, and vardenafil -riociguat This medicine may also interact with the following medications: -medicines for high blood pressure -other medicines for angina or heart failure This list may not describe all possible interactions. Give your health care provider a list of all the medicines, herbs, non-prescription drugs, or dietary supplements  you use. Also tell them if you smoke, drink alcohol, or use illegal drugs. Some items may interact with your medicine. What should I watch for while using this medicine? Check your heart rate and blood pressure regularly while you are taking this medicine. Ask your doctor or health care professional what your heart rate and blood pressure should be and when you should contact him or her. Tell your doctor or health care professional if you feel your medicine is no longer working. You may get dizzy. Do not drive, use machinery, or do anything that needs mental alertness until you know how this medicine affects you. To reduce the risk of dizzy or fainting spells, do not sit or stand up quickly, especially if you are an older patient. Alcohol can make you more dizzy, and increase flushing and rapid heartbeats. Avoid alcoholic drinks. Do not treat yourself for coughs, colds, or pain while you are taking this medicine without asking your doctor or health care professional for advice. Some ingredients may increase your blood pressure. What side effects may I notice from receiving this medicine? Side effects that you should report to your doctor or health care professional as soon as possible: -bluish discoloration of lips, fingernails, or palms of hands -irregular heartbeat, palpitations -low blood pressure -nausea, vomiting -persistent headache -unusually weak or tired Side effects that usually do not require medical attention (report to your doctor or health care professional if they continue or are bothersome): -flushing of the face or neck -rash This list may not describe all possible side effects. Call your doctor for medical advice about side effects. You may report side effects to FDA at 1-800-FDA-1088. Where should I keep my medicine? Keep out of the reach of children. Store between 15 and 30 degrees C (59 and 86 degrees F). Keep container tightly closed. Throw away any unused medicine after the  expiration date. NOTE: This sheet is a summary. It may not cover all possible information. If you have questions about this medicine, talk to your doctor, pharmacist, or health care provider.  2019 Elsevier/Gold Standard (2013-10-07 14:48:19) Carvedilol tablets What is this medicine? CARVEDILOL (KAR ve dil ol) is a beta-blocker. Beta-blockers reduce the workload on the heart and help it to beat more regularly. This medicine is used to treat high blood pressure and heart failure. This medicine may be used for other purposes; ask your health care provider or pharmacist if you have questions. COMMON BRAND NAME(S): Coreg What should I tell my health care provider before I take this medicine? They need to know if you have any of these conditions: -circulation problems -diabetes -history of heart attack or heart disease -liver disease -lung or breathing disease, like asthma or emphysema -pheochromocytoma -slow or irregular heartbeat -thyroid disease -an unusual or allergic reaction to carvedilol, other beta-blockers, medicines, foods, dyes, or preservatives -pregnant or trying to get pregnant -breast-feeding How should I use this medicine? Take this medicine by mouth with a glass of water. Follow the directions on the prescription label. It is best to take the tablets with food. Take your doses at regular intervals. Do not take your medicine more often than directed. Do  not stop taking except on the advice of your doctor or health care professional. Talk to your pediatrician regarding the use of this medicine in children. Special care may be needed. Overdosage: If you think you have taken too much of this medicine contact a poison control center or emergency room at once. NOTE: This medicine is only for you. Do not share this medicine with others. What if I miss a dose? If you miss a dose, take it as soon as you can. If it is almost time for your next dose, take only that dose. Do not take  double or extra doses. What may interact with this medicine? This medicine may interact with the following medications: -certain medicines for blood pressure, heart disease, irregular heart beat -certain medicines for depression, like fluoxetine or paroxetine -certain medicines for diabetes, like glipizide or glyburide -cimetidine -clonidine -cyclosporine -digoxin -MAOIs like Carbex, Eldepryl, Marplan, Nardil, and Parnate -reserpine -rifampin This list may not describe all possible interactions. Give your health care provider a list of all the medicines, herbs, non-prescription drugs, or dietary supplements you use. Also tell them if you smoke, drink alcohol, or use illegal drugs. Some items may interact with your medicine. What should I watch for while using this medicine? Check your heart rate and blood pressure regularly while you are taking this medicine. Ask your doctor or health care professional what your heart rate and blood pressure should be, and when you should contact him or her. Do not stop taking this medicine suddenly. This could lead to serious heart-related effects. Contact your doctor or health care professional if you have difficulty breathing while taking this drug. Check your weight daily. Ask your doctor or health care professional when you should notify him/her of any weight gain. You may get drowsy or dizzy. Do not drive, use machinery, or do anything that requires mental alertness until you know how this medicine affects you. To reduce the risk of dizzy or fainting spells, do not sit or stand up quickly. Alcohol can make you more drowsy, and increase flushing and rapid heartbeats. Avoid alcoholic drinks. If you have diabetes, check your blood sugar as directed. Tell your doctor if you have changes in your blood sugar while you are taking this medicine. If you are going to have surgery, tell your doctor or health care professional that you are taking this medicine. What  side effects may I notice from receiving this medicine? Side effects that you should report to your doctor or health care professional as soon as possible: -allergic reactions like skin rash, itching or hives, swelling of the face, lips, or tongue -breathing problems -dark urine -irregular heartbeat -swollen legs or ankles -vomiting -yellowing of the eyes or skin Side effects that usually do not require medical attention (report to your doctor or health care professional if they continue or are bothersome): -change in sex drive or performance -diarrhea -dry eyes (especially if wearing contact lenses) -dry, itching skin -headache -nausea -unusually tired This list may not describe all possible side effects. Call your doctor for medical advice about side effects. You may report side effects to FDA at 1-800-FDA-1088. Where should I keep my medicine? Keep out of the reach of children. Store at room temperature below 30 degrees C (86 degrees F). Protect from moisture. Keep container tightly closed. Throw away any unused medicine after the expiration date. NOTE: This sheet is a summary. It may not cover all possible information. If you have questions about this medicine, talk to  your doctor, pharmacist, or health care provider.  2019 Elsevier/Gold Standard (2013-08-14 14:12:02)

## 2019-02-25 DIAGNOSIS — R3915 Urgency of urination: Secondary | ICD-10-CM | POA: Diagnosis not present

## 2019-02-26 ENCOUNTER — Other Ambulatory Visit: Payer: Self-pay | Admitting: Family Medicine

## 2019-02-28 NOTE — Telephone Encounter (Signed)
Electronic refill request. Trazodone Last office visit:   08/31/2018 Last Filled:    180 tablet 1 10/19/2018  Please advise.

## 2019-03-01 ENCOUNTER — Encounter: Payer: Self-pay | Admitting: *Deleted

## 2019-03-01 NOTE — Telephone Encounter (Signed)
Sent. Thanks.  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   Please schedule.

## 2019-03-01 NOTE — Telephone Encounter (Signed)
Letter mailed

## 2019-04-04 ENCOUNTER — Telehealth: Payer: Self-pay | Admitting: *Deleted

## 2019-04-04 NOTE — Telephone Encounter (Signed)
Spoke to pharmacy.

## 2019-04-04 NOTE — Telephone Encounter (Signed)
Aware, patient has tolerated both.  Would continue given his hx.  I appreciate the call.

## 2019-04-04 NOTE — Telephone Encounter (Signed)
Pharmacy left a voicemail stating they needed to make Dr. Damita Dunnings aware that there is an drug interaction with patient taking Pravastatin and Gemfibrozil. Pharmacy requested a call back stating that Dr. Damita Dunnings was aware of this.

## 2019-05-12 DIAGNOSIS — H524 Presbyopia: Secondary | ICD-10-CM | POA: Diagnosis not present

## 2019-05-12 DIAGNOSIS — H40013 Open angle with borderline findings, low risk, bilateral: Secondary | ICD-10-CM | POA: Diagnosis not present

## 2019-06-03 DIAGNOSIS — H02105 Unspecified ectropion of left lower eyelid: Secondary | ICD-10-CM | POA: Diagnosis not present

## 2019-06-03 DIAGNOSIS — H40053 Ocular hypertension, bilateral: Secondary | ICD-10-CM | POA: Diagnosis not present

## 2019-06-03 DIAGNOSIS — H40013 Open angle with borderline findings, low risk, bilateral: Secondary | ICD-10-CM | POA: Diagnosis not present

## 2019-06-10 ENCOUNTER — Telehealth: Payer: Self-pay | Admitting: Family Medicine

## 2019-06-10 DIAGNOSIS — H02109 Unspecified ectropion of unspecified eye, unspecified eyelid: Secondary | ICD-10-CM

## 2019-06-10 NOTE — Telephone Encounter (Signed)
Received a call from Dr Elayne Snare [facility]. [facility] referred this patient to this office but b/c he has an HMO plan they need a Referral from his PCP. 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 Phone# [phone], [phone].NPI #[id number] [address] [place] Z[place],[id number]. Fax referral over to their office when complete.

## 2019-06-12 NOTE — Telephone Encounter (Signed)
Ordered. Thanks

## 2019-06-17 DIAGNOSIS — H40052 Ocular hypertension, left eye: Secondary | ICD-10-CM | POA: Diagnosis not present

## 2019-06-30 DIAGNOSIS — D485 Neoplasm of uncertain behavior of skin: Secondary | ICD-10-CM | POA: Diagnosis not present

## 2019-06-30 DIAGNOSIS — H16212 Exposure keratoconjunctivitis, left eye: Secondary | ICD-10-CM | POA: Diagnosis not present

## 2019-06-30 DIAGNOSIS — H02115 Cicatricial ectropion of left lower eyelid: Secondary | ICD-10-CM | POA: Diagnosis not present

## 2019-06-30 DIAGNOSIS — H04123 Dry eye syndrome of bilateral lacrimal glands: Secondary | ICD-10-CM | POA: Diagnosis not present

## 2019-06-30 DIAGNOSIS — H02535 Eyelid retraction left lower eyelid: Secondary | ICD-10-CM | POA: Diagnosis not present

## 2019-07-04 ENCOUNTER — Telehealth: Payer: Self-pay | Admitting: Family Medicine

## 2019-07-04 NOTE — Telephone Encounter (Signed)
Patient's wife called and stated that that on July 1st the patient coughed up some blood which last a day or so. She stated he is still coughing but has not coughed up any blood. She stated that patient was a smoker and has not smoked in several years.    Patient's wife would like to know what you advise.   Best C/B # [phone]  Patient's Wife ok'd to Leave a detailed message if they do not answer

## 2019-07-04 NOTE — Telephone Encounter (Signed)
Appointment scheduled.

## 2019-07-04 NOTE — Telephone Encounter (Signed)
Recommend OV here when possible with ER cautions in the meantime. Thanks.

## 2019-07-05 ENCOUNTER — Other Ambulatory Visit: Payer: Self-pay | Admitting: Family Medicine

## 2019-07-05 NOTE — Telephone Encounter (Signed)
Electronic refill request. Trazodone Last office visit:   08/31/2018 Last Filled:    180 tablet 0 03/01/2019  Please advise.

## 2019-07-06 NOTE — Telephone Encounter (Signed)
Sent. Thanks.   

## 2019-07-07 ENCOUNTER — Ambulatory Visit (INDEPENDENT_AMBULATORY_CARE_PROVIDER_SITE_OTHER)
Admission: RE | Admit: 2019-07-07 | Discharge: 2019-07-07 | Disposition: A | Discharge status: Home / Self Care | Payer: Medicare Other | Source: Ambulatory Visit | Attending: Family Medicine | Admitting: Family Medicine

## 2019-07-07 ENCOUNTER — Ambulatory Visit (INDEPENDENT_AMBULATORY_CARE_PROVIDER_SITE_OTHER): Payer: Medicare Other | Admitting: Family Medicine

## 2019-07-07 ENCOUNTER — Encounter: Payer: Self-pay | Admitting: Family Medicine

## 2019-07-07 ENCOUNTER — Other Ambulatory Visit: Payer: Self-pay

## 2019-07-07 VITALS — BP 130/74 | HR 83 | Temp 98.0°F | Ht 69.0 in | Wt 180.1 lb

## 2019-07-07 DIAGNOSIS — R042 Hemoptysis: Secondary | ICD-10-CM

## 2019-07-07 DIAGNOSIS — J449 Chronic obstructive pulmonary disease, unspecified: Secondary | ICD-10-CM | POA: Diagnosis not present

## 2019-07-07 LAB — CBC WITH DIFFERENTIAL/PLATELET
Basophils Absolute: 0.1 10*3/uL (ref 0.0–0.1)
Basophils Relative: 0.9 % (ref 0.0–3.0)
Eosinophils Absolute: 0.4 10*3/uL (ref 0.0–0.7)
Eosinophils Relative: 6 % — ABNORMAL HIGH (ref 0.0–5.0)
HCT: 46.6 % (ref 39.0–52.0)
Hemoglobin: 16.1 g/dL (ref 13.0–17.0)
Lymphocytes Relative: 36.4 % (ref 12.0–46.0)
Lymphs Abs: 2.7 10*3/uL (ref 0.7–4.0)
MCHC: 34.6 g/dL (ref 30.0–36.0)
MCV: 90.7 fl (ref 78.0–100.0)
Monocytes Absolute: 0.7 10*3/uL (ref 0.1–1.0)
Monocytes Relative: 9.2 % (ref 3.0–12.0)
Neutro Abs: 3.5 10*3/uL (ref 1.4–7.7)
Neutrophils Relative %: 47.5 % (ref 43.0–77.0)
Platelets: 190 10*3/uL (ref 150.0–400.0)
RBC: 5.14 Mil/uL (ref 4.22–5.81)
RDW: 13.2 % (ref 11.5–15.5)
WBC: 7.3 10*3/uL (ref 4.0–10.5)

## 2019-07-07 MED ORDER — CYCLOBENZAPRINE HCL 5 MG PO TABS: 5.0000 mg | ORAL_TABLET | Freq: Three times a day (TID) | ORAL | Status: DC | PRN

## 2019-07-07 MED ORDER — GEMFIBROZIL 600 MG PO TABS: 600.0000 mg | ORAL_TABLET | Freq: Two times a day (BID) | ORAL | Status: DC

## 2019-07-07 MED ORDER — TAMSULOSIN HCL 0.4 MG PO CAPS: 0.8000 mg | ORAL_CAPSULE | Freq: Every day | ORAL | Status: DC

## 2019-07-07 MED ORDER — INDOMETHACIN 50 MG PO CAPS: 50.0000 mg | ORAL_CAPSULE | Freq: Two times a day (BID) | ORAL | Status: DC | PRN

## 2019-07-07 MED ORDER — PRAVASTATIN SODIUM 40 MG PO TABS: 40.0000 mg | ORAL_TABLET | Freq: Every day | ORAL | Status: DC

## 2019-07-07 MED ORDER — BUPROPION HCL ER (SR) 150 MG PO TB12: 150.0000 mg | ORAL_TABLET | Freq: Two times a day (BID) | ORAL | Status: DC

## 2019-07-07 NOTE — Progress Notes (Signed)
On Aspirin 81mg  but not taking nsaids often at all.   Episodic hemoptysis, dark but not bright red.  Noted on 06/22/2019.  Noted intermittently and episodically for about 10 days, with scant blood streaked phlegm, with antecedent tickle in the throat.  No blood in stool or urine.  Hemoptysis seemed to resolved, several days w/o sx, then had return with scant amount 2 days ago and yesterday.  No nose bleeds.  No symptoms today.  No FCNAVD.  Sputum o/w is usually minimal or none.  He feels well o/w, "like an old man would."  No night sweats.  Weight is stable.    Meds, vitals, and allergies reviewed.   ROS: Per HPI unless specifically indicated in ROS section   GEN: nad, alert and oriented HEENT: mucous membranes moist, oropharynx within normal limits.  No ulceration seen. NECK: supple w/o LA CV: rrr.  PULM: ctab, no inc wob ABD: soft, +bs EXT: no edema SKIN: no acute rash, well perfused.

## 2019-07-07 NOTE — Patient Instructions (Signed)
Go to the lab on the way out.  We'll contact you with your lab and xray report. Don't change your meds for now.  Take care.  Glad to see you.

## 2019-07-10 DIAGNOSIS — R042 Hemoptysis: Secondary | ICD-10-CM | POA: Insufficient documentation

## 2019-07-10 NOTE — Assessment & Plan Note (Addendum)
We talked about differential diagnosis.  He does not have any B symptoms at this point such as night sweats or weight loss.  We talked about the differential could range anywhere from a benign cause such as upper airway irritation through a more serious issue.  He was aware of all this.  CBC is normal. CXR with COPD w/o acute changes. If his sx totally resolve, then he doesn't have to do anything else. If he continues to have troubles then he'll let me know. We may need to have him see the ENT clinic vs get extra imaging.  At this point still okay for outpatient follow-up.  I suspect he has a benign source and he does not have symptoms today.

## 2019-07-25 DIAGNOSIS — D485 Neoplasm of uncertain behavior of skin: Secondary | ICD-10-CM | POA: Diagnosis not present

## 2019-07-25 DIAGNOSIS — C441192 Basal cell carcinoma of skin of left lower eyelid, including canthus: Secondary | ICD-10-CM | POA: Diagnosis not present

## 2019-08-08 DIAGNOSIS — H401131 Primary open-angle glaucoma, bilateral, mild stage: Secondary | ICD-10-CM | POA: Diagnosis not present

## 2019-08-12 ENCOUNTER — Telehealth: Payer: Self-pay | Admitting: Family Medicine

## 2019-08-12 NOTE — Telephone Encounter (Signed)
See below.  H/o tobacco use noted, h/o COPD with cxr with expected findings.  He has had a cough prev.  Assuming this cough is stable and not worse, then I would presume this to be his baseline.  If worse in the meantime, then he needs eval re: cough. Thanks.

## 2019-08-12 NOTE — Telephone Encounter (Signed)
Patient's Wife Eula Fried today in regards to patient having a procedure done on his eye Monday morning @ 8:30am.  Wife stated that Dr Verita Schneiders office called this a/m to screen the patient for his appointment and wife stated that the patient does have a cough but it is not covid related, it is a smokers cough from where he stopped smoking years ago and the cough has hung around.  Ginger with Dr Micki Riley advised them to contact the Primary Care Provider so we can call to speak with with her and confirm this is not covid related and that the patient normally has this cough.  Ginger advised patient that this needs to be addressed before 4:00pm today or they will have to cancel the patient's appointment. Ginger C/B # [phone]   Patient and wife would like a call back when this has been completed so they know that he can still have his appt Monday  Patient's C/B # [phone]

## 2019-08-12 NOTE — Telephone Encounter (Signed)
Ginger at Dr. Micki Riley office advised.

## 2019-08-15 DIAGNOSIS — S01112S Laceration without foreign body of left eyelid and periocular area, sequela: Secondary | ICD-10-CM | POA: Diagnosis not present

## 2019-08-15 DIAGNOSIS — C441192 Basal cell carcinoma of skin of left lower eyelid, including canthus: Secondary | ICD-10-CM | POA: Diagnosis not present

## 2019-08-24 ENCOUNTER — Other Ambulatory Visit: Payer: Self-pay | Admitting: Family Medicine

## 2019-08-29 ENCOUNTER — Other Ambulatory Visit: Payer: Self-pay | Admitting: Family Medicine

## 2019-08-29 DIAGNOSIS — M109 Gout, unspecified: Secondary | ICD-10-CM

## 2019-08-29 DIAGNOSIS — E119 Type 2 diabetes mellitus without complications: Secondary | ICD-10-CM

## 2019-08-30 ENCOUNTER — Ambulatory Visit (INDEPENDENT_AMBULATORY_CARE_PROVIDER_SITE_OTHER): Payer: Medicare Other

## 2019-08-30 ENCOUNTER — Ambulatory Visit: Payer: Medicare Other

## 2019-08-30 VITALS — Ht 69.0 in | Wt 176.4 lb

## 2019-08-30 DIAGNOSIS — Z0000 Encounter for general adult medical examination without abnormal findings: Secondary | ICD-10-CM

## 2019-08-30 NOTE — Patient Instructions (Signed)
Nathan Fernandez , Thank you for taking time to come for your Medicare Wellness Visit. I appreciate your ongoing commitment to your health goals. Please review the following plan we discussed and let me know if I can assist you in the future.   Screening recommendations/referrals: Colonoscopy: not required Recommended yearly ophthalmology/optometry visit for glaucoma screening and checkup Recommended yearly dental visit for hygiene and checkup  Vaccinations: Influenza vaccine: declines Pneumococcal vaccine: PPSV23 due Tdap vaccine: postponed Shingles vaccine: discussed    Advanced directives: Please bring a copy of your POA (Power of Attorney) and/or Living Will to your next appointment.    Conditions/risks identified: overweight  Next appointment: 09/06/2019 at 9:45  Preventive Care 18 Years and Older, Male Preventive care refers to lifestyle choices and visits with your health care provider that can promote health and wellness. What does preventive care include?  A yearly physical exam. This is also called an annual well check.  Dental exams once or twice a year.  Routine eye exams. Ask your health care provider how often you should have your eyes checked.  Personal lifestyle choices, including:  Daily care of your teeth and gums.  Regular physical activity.  Eating a healthy diet.  Avoiding tobacco and drug use.  Limiting alcohol use.  Practicing safe sex.  Taking low doses of aspirin every day.  Taking vitamin and mineral supplements as recommended by your health care provider. What happens during an annual well check? The services and screenings done by your health care provider during your annual well check will depend on your age, overall health, lifestyle risk factors, and family history of disease. Counseling  Your health care provider may ask you questions about your:  Alcohol use.  Tobacco use.  Drug use.  Emotional well-being.  Home and relationship  well-being.  Sexual activity.  Eating habits.  History of falls.  Memory and ability to understand (cognition).  Work and work Statistician. Screening  You may have the following tests or measurements:  Height, weight, and BMI.  Blood pressure.  Lipid and cholesterol levels. These may be checked every 5 years, or more frequently if you are over 62 years old.  Skin check.  Lung cancer screening. You may have this screening every year starting at age 27 if you have a 30-pack-year history of smoking and currently smoke or have quit within the past 15 years.  Fecal occult blood test (FOBT) of the stool. You may have this test every year starting at age 25.  Flexible sigmoidoscopy or colonoscopy. You may have a sigmoidoscopy every 5 years or a colonoscopy every 10 years starting at age 27.  Prostate cancer screening. Recommendations will vary depending on your family history and other risks.  Hepatitis C blood test.  Hepatitis B blood test.  Sexually transmitted disease (STD) testing.  Diabetes screening. This is done by checking your blood sugar (glucose) after you have not eaten for a while (fasting). You may have this done every 1-3 years.  Abdominal aortic aneurysm (AAA) screening. You may need this if you are a current or former smoker.  Osteoporosis. You may be screened starting at age 68 if you are at high risk. Talk with your health care provider about your test results, treatment options, and if necessary, the need for more tests. Vaccines  Your health care provider may recommend certain vaccines, such as:  Influenza vaccine. This is recommended every year.  Tetanus, diphtheria, and acellular pertussis (Tdap, Td) vaccine. You may need a Td  booster every 10 years.  Zoster vaccine. You may need this after age 62.  Pneumococcal 13-valent conjugate (PCV13) vaccine. One dose is recommended after age 74.  Pneumococcal polysaccharide (PPSV23) vaccine. One dose is  recommended after age 77. Talk to your health care provider about which screenings and vaccines you need and how often you need them. This information is not intended to replace advice given to you by your health care provider. Make sure you discuss any questions you have with your health care provider. Document Released: 01/04/2016 Document Revised: 08/27/2016 Document Reviewed: 10/09/2015 Elsevier Interactive Patient Education  2017 [place] Prevention in the Home Falls can cause injuries. They can happen to people of all ages. There are many things you can do to make your home safe and to help prevent falls. What can I do on the outside of my home?  Regularly fix the edges of walkways and driveways and fix any cracks.  Remove anything that might make you trip as you walk through a door, such as a raised step or threshold.  Trim any bushes or trees on the path to your home.  Use bright outdoor lighting.  Clear any walking paths of anything that might make someone trip, such as rocks or tools.  Regularly check to see if handrails are loose or broken. Make sure that both sides of any steps have handrails.  Any raised decks and porches should have guardrails on the edges.  Have any leaves, snow, or ice cleared regularly.  Use sand or salt on walking paths during winter.  Clean up any spills in your garage right away. This includes oil or grease spills. What can I do in the bathroom?  Use night lights.  Install grab bars by the toilet and in the tub and shower. Do not use towel bars as grab bars.  Use non-skid mats or decals in the tub or shower.  If you need to sit down in the shower, use a plastic, non-slip stool.  Keep the floor dry. Clean up any water that spills on the floor as soon as it happens.  Remove soap buildup in the tub or shower regularly.  Attach bath mats securely with double-sided non-slip rug tape.  Do not have throw rugs and other things on  the floor that can make you trip. What can I do in the bedroom?  Use night lights.  Make sure that you have a light by your bed that is easy to reach.  Do not use any sheets or blankets that are too big for your bed. They should not hang down onto the floor.  Have a firm chair that has side arms. You can use this for support while you get dressed.  Do not have throw rugs and other things on the floor that can make you trip. What can I do in the kitchen?  Clean up any spills right away.  Avoid walking on wet floors.  Keep items that you use a lot in easy-to-reach places.  If you need to reach something above you, use a strong step stool that has a grab bar.  Keep electrical cords out of the way.  Do not use floor polish or wax that makes floors slippery. If you must use wax, use non-skid floor wax.  Do not have throw rugs and other things on the floor that can make you trip. What can I do with my stairs?  Do not leave any items on the stairs.  Make sure that there are handrails on both sides of the stairs and use them. Fix handrails that are broken or loose. Make sure that handrails are as long as the stairways.  Check any carpeting to make sure that it is firmly attached to the stairs. Fix any carpet that is loose or worn.  Avoid having throw rugs at the top or bottom of the stairs. If you do have throw rugs, attach them to the floor with carpet tape.  Make sure that you have a light switch at the top of the stairs and the bottom of the stairs. If you do not have them, ask someone to add them for you. What else can I do to help prevent falls?  Wear shoes that:  Do not have high heels.  Have rubber bottoms.  Are comfortable and fit you well.  Are closed at the toe. Do not wear sandals.  If you use a stepladder:  Make sure that it is fully opened. Do not climb a closed stepladder.  Make sure that both sides of the stepladder are locked into place.  Ask someone to  hold it for you, if possible.  Clearly mark and make sure that you can see:  Any grab bars or handrails.  First and last steps.  Where the edge of each step is.  Use tools that help you move around (mobility aids) if they are needed. These include:  Canes.  Walkers.  Scooters.  Crutches.  Turn on the lights when you go into a dark area. Replace any light bulbs as soon as they burn out.  Set up your furniture so you have a clear path. Avoid moving your furniture around.  If any of your floors are uneven, fix them.  If there are any pets around you, be aware of where they are.  Review your medicines with your doctor. Some medicines can make you feel dizzy. This can increase your chance of falling. Ask your doctor what other things that you can do to help prevent falls. This information is not intended to replace advice given to you by your health care provider. Make sure you discuss any questions you have with your health care provider. Document Released: 10/04/2009 Document Revised: 05/15/2016 Document Reviewed: 01/12/2015 Elsevier Interactive Patient Education  2017 [employer].

## 2019-08-30 NOTE — Progress Notes (Signed)
PCP notes:  Health Maintenance:  Urine microalbumin, PPSV23, and eye exam are due. States has had eyes checked report needed.  Abnormal Screenings:  None  Patient concerns:  None  Nurse concerns:  None  Next PCP appt.: 09/06/2019 at 9:45

## 2019-08-30 NOTE — Progress Notes (Signed)
Subjective:   Nathan Fernandez is a 78 y.o. male who presents for Medicare Annual/Subsequent preventive examination.  This visit type was conducted due to national recommendations for restrictions regarding the COVID-19 Pandemic (e.g. social distancing). This format is felt to be most appropriate for this patient at this time. All issues noted in this document were discussed and addressed. No physical exam was performed (except for noted visual exam findings with Video Visits). This patient, Nathan Fernandez, has given permission to perform this visit via telephone. Vital signs may be absent or patient reported.  Patient location:  At home  Nurse location:  At home     Review of Systems:  n/a Cardiac Risk Factors include: advanced age (>32men, >24 women);diabetes mellitus;hypertension;male gender;sedentary lifestyle     Objective:    Vitals: Ht 5\' 9"  (1.753 m) Comment: per patient  Wt 176 lb 6.4 oz (80 kg) Comment: per patient  BMI 26.05 kg/m   Body mass index is 26.05 kg/m.  Advanced Directives 08/30/2019 01/21/2019 08/24/2018 12/29/2017 02/16/2017 02/08/2016  Does Patient Have a Medical Advance Directive? Yes Yes Yes No Yes Yes  Type of Paramedic of [place];Living will [place];Living will [place];Living will - [place]  Does patient want to make changes to medical advance directive? - No - Patient declined - - - -  Copy of [place] in Chart? No - copy requested No - copy requested No - copy requested - No - copy requested No - copy requested  Would patient like information on creating a medical advance directive? - - - No - Patient declined - -    Tobacco Social History   Tobacco Use  Smoking Status Former Smoker  . Types: Cigars  Smokeless Tobacco Never Used  Tobacco Comment   cigar occassionally     Counseling given: Not Answered  Comment: cigar occassionally   Clinical Intake:  Pre-visit preparation completed: Yes  Pain : No/denies pain     Nutritional Status: BMI 25 -29 Overweight Nutritional Risks: None Diabetes: Yes CBG done?: No Did pt. bring in CBG monitor from home?: No  How often do you need to have someone help you when you read instructions, pamphlets, or other written materials from your doctor or pharmacy?: 1 - Never What is the last grade level you completed in school?: some college  Interpreter Needed?: No  Information entered by :: NAllen LPN  Past Medical History:  Diagnosis Date  . 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   Hosp R facial weakness  . CAD (coronary artery disease)    MI, [place], PTCA; [place] 04/18/13: Distal left main 40%, proximal LAD with several aneurysmal segments, proximal LAD 50% prior to and after aneurysmal segments, area does not appear to flow-limiting, proximal diagonal 70%, ostial circumflex 20%, proximal circumflex 20%, OM1 30-40%, proximal RCA 40%, mid RCA 30%, distal RCA 20%, EF 50% => med Rx  . COPD (chronic obstructive pulmonary disease) ([place])   . DM2 (diabetes mellitus, type 2) ([place])   . History of ETT 1998   wnl  . History of MRI 08/04/09   brain- atrophy sm vess dz  . HLD (hyperlipidemia)   . HTN (hypertension)   . OA (osteoarthritis)    Past Surgical History:  Procedure Laterality Date  . cataract surgery  09/06   repair lens which moved  . LEFT HEART CATH AND CORONARY ANGIOGRAPHY N/A 01/21/2019  Procedure: LEFT HEART CATH AND CORONARY ANGIOGRAPHY;  Surgeon: Burnell Blanks, MD;  Location: [facility];  Service: Cardiovascular;  Laterality: N/A;  . stress myoview  06/29/06   sm distal anteroseptal & apical infarct  . THYROIDECTOMY, PARTIAL  1967   B9 growth   Family History  Problem Relation Age of Onset  . Hypertension Mother   . Aneurysm Mother   . Stroke Mother   . Hypertension Father   . Heart disease Father        CAD  . Heart  disease Son        MI  . Colon cancer Neg Hx   . Prostate cancer Neg Hx    Social History   Socioeconomic History  . Marital status: Married    Spouse name: Not on file  . Number of children: 4  . Years of education: Not on file  . Highest education level: Not on file  Occupational History  . 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 Occupation: retired  Scientific laboratory technician  . Financial resource strain: Not hard at all  . Food insecurity    Worry: Never true    Inability: Never true  . Transportation needs    Medical: No    Non-medical: No  Tobacco Use  . Smoking status: Former Smoker    Types: Cigars  . Smokeless tobacco: Never Used  . Tobacco comment: cigar occassionally  Substance and Sexual Activity  . Alcohol use: Not Currently    Alcohol/week: 0.0 standard drinks    Comment: once a year  . Drug use: No  . Sexual activity: Not Currently  Lifestyle  . Physical activity    Days per week: 0 days    Minutes per session: 0 min  . Stress: Not at all  Relationships  . Social Herbalist on phone: Not on file    Gets together: Not on file    Attends religious service: Not on file    Active member of club or organization: Not on file    Attends meetings of clubs or organizations: Not on file    Relationship status: Not on file  Other Topics Concern  . Not on file  Social History Narrative   Divorced, lives with partner [name])- married 09/2015   3 living children, had a daughter who died at age 71 in 11-Nov-2023   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, retired as of 2019    Outpatient Encounter Medications as of 08/30/2019  Medication Sig  . aspirin 81 MG EC tablet Take 81 mg by mouth daily.    Marland Kitchen buPROPion (WELLBUTRIN SR) 150 MG 12 hr tablet TAKE 1 TABLET BY MOUTH TWICE A DAY  . carvedilol (COREG) 6.25 MG tablet Take 1 tablet (6.25 mg total) by mouth 2 (two) times daily.  . cyclobenzaprine  (FLEXERIL) 5 MG tablet Take 1 tablet (5 mg total) by mouth 3 (three) times daily as needed for muscle spasms.  Marland Kitchen gemfibrozil (LOPID) 600 MG tablet Take 1 tablet (600 mg total) by mouth 2 (two) times daily before a meal.  . ibuprofen (ADVIL,MOTRIN) 200 MG tablet Take 400 mg by mouth every 6 (six) hours as needed (for pain).  . indomethacin (INDOCIN) 50 MG capsule Take 1 capsule (50 mg total) by mouth 2 (two) times daily as needed (for gout/take with food).  . isosorbide mononitrate (IMDUR) 30 MG 24 hr tablet Take 0.5  tablets (15 mg total) by mouth daily.  Marland Kitchen ketoconazole (NIZORAL) 2 % cream Apply 1 application topically 2 (two) times daily as needed for irritation.  . meclizine (ANTIVERT) 12.5 MG tablet Take 1-2 tablets (12.5-25 mg total) by mouth 3 (three) times daily as needed for dizziness.  . nitroGLYCERIN (NITROSTAT) 0.4 MG SL tablet Place 1 tablet (0.4 mg total) under the tongue every 5 (five) minutes as needed for chest pain. Up to 3 doses  . Omega-3 Fatty Acids (FISH OIL) 1000 MG CAPS Take 1,000 mg by mouth daily.   . pravastatin (PRAVACHOL) 40 MG tablet Take 1 tablet (40 mg total) by mouth at bedtime.  . tamsulosin (FLOMAX) 0.4 MG CAPS capsule Take 2 capsules (0.8 mg total) by mouth daily.  . traZODone (DESYREL) 100 MG tablet TAKE 2 TABLETS (200 MG TOTAL) BY MOUTH AT BEDTIME.  . vitamin E 1000 UNIT capsule Take 1,000 Units by mouth daily.   No facility-administered encounter medications on file as of 08/30/2019.     Activities of Daily Living In your present state of health, do you have any difficulty performing the following activities: 08/30/2019  Hearing? Y  Comment a little bit  Vision? Y  Comment blurriness in right eye  Difficulty concentrating or making decisions? N  Walking or climbing stairs? N  Dressing or bathing? N  Doing errands, shopping? N  Preparing Food and eating ? N  Using the Toilet? N  In the past six months, have you accidently leaked urine? Y  Do you have  problems with loss of bowel control? N  Managing your Medications? N  Managing your Finances? N  Housekeeping or managing your Housekeeping? N  Some recent data might be hidden    Patient Care Team: Tonia Ghent, MD as PCP - General (Family Medicine) Josue Hector, MD as PCP - Cardiology (Cardiology) Josue Hector, MD as Consulting Physician (Cardiology)   Assessment:   This is a routine wellness examination for Gianno.  Exercise Activities and Dietary recommendations Current Exercise Habits: The patient does not participate in regular exercise at present  Goals    . Eat more fruits and vegetables     Starting 08/24/2018, I will continue to decrease intake of starchy foods and increase intake of fresh fruits and vegetables to 5 servings day.    . Patient Stated     08/30/2019, no goals       Fall Risk Fall Risk  08/30/2019 08/24/2018 02/16/2017 02/08/2016 04/17/2014  Falls in the past year? 0 No No No No  Risk for fall due to : Medication side effect - - - -  Follow up Falls evaluation completed;Falls prevention discussed - - - -   Is the patient's home free of loose throw rugs in walkways, pet beds, electrical cords, etc?   yes      Grab bars in the bathroom? no      Handrails on the stairs?   no      Adequate lighting?   yes  Timed Get Up and Go Performed: n/a  Depression Screen PHQ 2/9 Scores 08/30/2019 08/24/2018 02/16/2017 02/08/2016  PHQ - 2 Score 0 0 0 0  PHQ- 9 Score 0 0 - -    Cognitive Function MMSE - Mini Mental State Exam 08/30/2019 08/24/2018 02/16/2017 02/08/2016  Orientation to time 5 5 5 5   Orientation to Place 5 5 5 5   Registration 3 3 3 3   Attention/ Calculation 5 0 0 5  Recall 3  3 1 3   Recall-comments - - pt was unable to recall 2 of 3 words -  Language- name 2 objects 0 0 0 -  Language- repeat 1 1 1 1   Language- follow 3 step command 0 3 3 3   Language- read & follow direction 0 0 0 1  Write a sentence 0 0 0 -  Copy design 0 0 0 -  Total score 22 20 18  -    Mini Cog  Mini-Cog screen was completed. Maximum score is 22. A value of 0 denotes this part of the MMSE was not completed or the patient failed this part of the Mini-Cog screening.       Immunization History  Administered Date(s) Administered  . Influenza Split 01/07/2012  . Influenza,inj,Quad PF,6+ Mos 02/08/2016  . Pneumococcal Conjugate-13 02/08/2016  . Td 09/01/2001    Qualifies for Shingles Vaccine? yes  Screening Tests Health Maintenance  Topic Date Due  . URINE MICROALBUMIN  11/26/2011  . OPHTHALMOLOGY EXAM  05/22/2016  . PNA vac Low Risk Adult (2 of 2 - PPSV23) 02/07/2017  . HEMOGLOBIN A1C  02/22/2019  . INFLUENZA VACCINE  07/23/2019  . TETANUS/TDAP  08/31/2021 (Originally 09/02/2011)  . FOOT EXAM  09/01/2019   Cancer Screenings: Lung: Low Dose CT Chest recommended if Age 13-80 years, 30 pack-year currently smoking OR have quit w/in 15years. Patient does not qualify. Colorectal: up to date  Additional Screenings:  Hepatitis C Screening:n/a      Plan:    Patient has no goals at this time.  I have personally reviewed and noted the following in the patient's chart:   . Medical and social history . Use of alcohol, tobacco or illicit drugs  . Current medications and supplements . Functional ability and status . Nutritional status . Physical activity . Advanced directives . List of other physicians . Hospitalizations, surgeries, and ER visits in previous 12 months . Vitals . Screenings to include cognitive, depression, and falls . Referrals and appointments  In addition, I have reviewed and discussed with patient certain preventive protocols, quality metrics, and best practice recommendations. A written personalized care plan for preventive services as well as general preventive health recommendations were provided to patient.     Kellie Simmering, LPN  [id number]

## 2019-08-31 ENCOUNTER — Other Ambulatory Visit (INDEPENDENT_AMBULATORY_CARE_PROVIDER_SITE_OTHER): Payer: Medicare Other

## 2019-08-31 DIAGNOSIS — M109 Gout, unspecified: Secondary | ICD-10-CM

## 2019-08-31 DIAGNOSIS — E119 Type 2 diabetes mellitus without complications: Secondary | ICD-10-CM

## 2019-08-31 LAB — LIPID PANEL
Cholesterol: 144 mg/dL (ref 0–200)
HDL: 28.8 mg/dL — ABNORMAL LOW (ref >39.00)
NonHDL: 114.77
Total CHOL/HDL Ratio: 5
Triglycerides: 320 mg/dL — ABNORMAL HIGH (ref 0.0–149.0)
VLDL: 64 mg/dL — ABNORMAL HIGH (ref 0.0–40.0)

## 2019-08-31 LAB — COMPREHENSIVE METABOLIC PANEL
ALT: 17 U/L (ref 0–53)
AST: 18 U/L (ref 0–37)
Albumin: 3.9 g/dL (ref 3.5–5.2)
Alkaline Phosphatase: 41 U/L (ref 39–117)
BUN: 16 mg/dL (ref 6–23)
CO2: 31 mEq/L (ref 19–32)
Calcium: 9.2 mg/dL (ref 8.4–10.5)
Chloride: 101 mEq/L (ref 96–112)
Creatinine, Ser: 1.07 mg/dL (ref 0.40–1.50)
GFR: 66.67 mL/min (ref >60.00)
Glucose, Bld: 194 mg/dL — ABNORMAL HIGH (ref 70–99)
Potassium: 4.5 mEq/L (ref 3.5–5.1)
Sodium: 139 mEq/L (ref 135–145)
Total Bilirubin: 0.6 mg/dL (ref 0.2–1.2)
Total Protein: 6.8 g/dL (ref 6.0–8.3)

## 2019-08-31 LAB — HEMOGLOBIN A1C: Hgb A1c MFr Bld: 8.1 % — ABNORMAL HIGH (ref 4.6–6.5)

## 2019-08-31 LAB — URIC ACID: Uric Acid, Serum: 7.9 mg/dL — ABNORMAL HIGH (ref 4.0–7.8)

## 2019-08-31 LAB — LDL CHOLESTEROL, DIRECT: Direct LDL: 81 mg/dL

## 2019-09-06 ENCOUNTER — Ambulatory Visit (INDEPENDENT_AMBULATORY_CARE_PROVIDER_SITE_OTHER): Payer: Medicare Other | Admitting: Family Medicine

## 2019-09-06 ENCOUNTER — Other Ambulatory Visit: Payer: Self-pay

## 2019-09-06 ENCOUNTER — Encounter: Payer: Self-pay | Admitting: Family Medicine

## 2019-09-06 VITALS — BP 124/74 | HR 71 | Temp 97.6°F | Ht 69.0 in | Wt 180.1 lb

## 2019-09-06 DIAGNOSIS — Z9119 Patient's noncompliance with other medical treatment and regimen: Secondary | ICD-10-CM

## 2019-09-06 DIAGNOSIS — Z0000 Encounter for general adult medical examination without abnormal findings: Secondary | ICD-10-CM

## 2019-09-06 DIAGNOSIS — Z91199 Patient's noncompliance with other medical treatment and regimen due to unspecified reason: Secondary | ICD-10-CM

## 2019-09-06 DIAGNOSIS — E119 Type 2 diabetes mellitus without complications: Secondary | ICD-10-CM | POA: Diagnosis not present

## 2019-09-06 DIAGNOSIS — I10 Essential (primary) hypertension: Secondary | ICD-10-CM

## 2019-09-06 DIAGNOSIS — E7800 Pure hypercholesterolemia, unspecified: Secondary | ICD-10-CM

## 2019-09-06 DIAGNOSIS — J449 Chronic obstructive pulmonary disease, unspecified: Secondary | ICD-10-CM

## 2019-09-06 DIAGNOSIS — Z7189 Other specified counseling: Secondary | ICD-10-CM

## 2019-09-06 MED ORDER — ALBUTEROL SULFATE HFA 108 (90 BASE) MCG/ACT IN AERS: 1.0000 | INHALATION_SPRAY | Freq: Four times a day (QID) | RESPIRATORY_TRACT | 1 refills | Status: DC | PRN

## 2019-09-06 MED ORDER — CARVEDILOL 6.25 MG PO TABS: 3.1250 mg | ORAL_TABLET | Freq: Two times a day (BID) | ORAL | Status: DC

## 2019-09-06 NOTE — Progress Notes (Signed)
Hypertension/CAD.   Using medication without problems or lightheadedness: he isn't clearly lightheaded or vertiginous but his balance is not as good in the AM, upon getting out of bed.  Has been going on for about 1 year.   No acute changes.  Chest pain with exertion:no Edema:no Short of breath: episodically noted, worse in the last year.  Some clear vs light yellow sputum, some cough.   No wheeze.  No inhaler use.   Elevated Cholesterol: Using medications without problems: yes Muscle aches: no Diet compliance: encouraged.   Exercise: dec exercise tolerance noted.  He can walk a few hundred yards.  He is deconditioned from covid restrictions.    Diabetes:  No meds Hypoglycemic episodes:no Hyperglycemic episodes:no Feet problems:no Blood Sugars averaging: not checked.   eye exam within last year: f/u pending.  A1c better than prev but not at goal.   He cut back on some carbs.   Defer MALB since he can't take ACE/ARB.    His son has CAD, d/w pt.    Flu vaccine- pt declined, encouraged.  We talked about the benefit but he just refuses to get vaccinated. PNA 13- 2017, declined 2020.  Again we talked about the benefit but he refuses to do it. Tetanus vaccine - may be cheaper at the pharmacy. Shingles d/w pt.   Colonoscopy 2019 Hearing screen failed.  D/w pt.  Declined hearing aids.   Wife [name] designated if patient were incapacitated.   Prostate cancer screening and PSA options (with potential risks and benefits of testing vs not testing) were discussed along with recent recs/guidelines.  He declined testing PSA at this point.  He has f/u pending re: left lower eyelid.  He has f/u with [facility] about BCC.     No recent gout flares.    PMH and SH reviewed Meds, vitals, and allergies reviewed.   ROS: Per HPI unless specifically indicated in ROS section   GEN: nad, alert and oriented HEENT: mucous membranes moist NECK: supple w/o LA CV: rrr. PULM: ctab, no inc wob ABD: soft,  +bs EXT: no edema SKIN: no acute rash  Diabetic foot exam: Normal inspection No skin breakdown No calluses  Normal DP pulses Normal sensation to light touch and monofilament Nails normal

## 2019-09-06 NOTE — Patient Instructions (Addendum)
Check with your insurance to see if they will cover the shingrix and tetanus shots.   Call about cardiology follow up when possible.   Recheck A1c in 6 months.  Keep working on [employer].    Check to see what sugar meter is covered and let me know.    Cut the carvedilol in half- take 1/2 tab twice a day.  See if you feel better in the AMs.   Then try using albuterol and see if your breathing is better.  Take care.  Glad to see you.

## 2019-09-11 DIAGNOSIS — Z9114 Patient's other noncompliance with medication regimen: Secondary | ICD-10-CM | POA: Insufficient documentation

## 2019-09-11 DIAGNOSIS — Z91199 Patient's noncompliance with other medical treatment and regimen due to unspecified reason: Secondary | ICD-10-CM | POA: Insufficient documentation

## 2019-09-11 DIAGNOSIS — Z9119 Patient's noncompliance with other medical treatment and regimen: Secondary | ICD-10-CM | POA: Insufficient documentation

## 2019-09-11 NOTE — Assessment & Plan Note (Signed)
Encouraged exercise as tolerated.  Labs discussed with patient.  No change in meds at this point.

## 2019-09-11 NOTE — Assessment & Plan Note (Signed)
Flu vaccine- pt declined, encouraged.  We talked about the benefit but he just refuses to get vaccinated. PNA 13- 2017, declined 2020.  Again we talked about the benefit but he refuses to do it.

## 2019-09-11 NOTE — Assessment & Plan Note (Signed)
Wife Nathan Fernandez designated if patient were incapacitated.

## 2019-09-11 NOTE — Assessment & Plan Note (Signed)
He can start using albuterol after he decreases his carvedilol and see if he feels better with that.  See after visit summary.  He agrees.

## 2019-09-11 NOTE — Assessment & Plan Note (Signed)
>  25 minutes spent in face to face time with patient, >50% spent in counselling or coordination of care No change in meds.  Recheck in 6 months.  See after visit summary.

## 2019-09-11 NOTE — Assessment & Plan Note (Signed)
Flu vaccine- pt declined, encouraged.  We talked about the benefit but he just refuses to get vaccinated. PNA 13- 2017, declined 2020.  Again we talked about the benefit but he refuses to do it. Tetanus vaccine - may be cheaper at the pharmacy. Shingles d/w pt.   Colonoscopy 2019 Hearing screen failed.  D/w pt.  Declined hearing aids.   Wife [name] designated if patient were incapacitated.   Prostate cancer screening and PSA options (with potential risks and benefits of testing vs not testing) were discussed along with recent recs/guidelines.  He declined testing PSA at this point.

## 2019-09-11 NOTE — Assessment & Plan Note (Signed)
Decrease carvedilol to 3.125 mg twice a day.  He will see if he feels better in the mornings.  See after visit summary.

## 2019-09-21 DIAGNOSIS — H401131 Primary open-angle glaucoma, bilateral, mild stage: Secondary | ICD-10-CM | POA: Diagnosis not present

## 2019-09-29 ENCOUNTER — Telehealth: Payer: Self-pay | Admitting: Family Medicine

## 2019-09-29 NOTE — Telephone Encounter (Signed)
Best number [phone]  [facility] @ dr Lorina Rabon office called to see if you received surgical clearance paperwork  Pt is having surgery Monday  She will refax again today They need this asap

## 2019-09-30 ENCOUNTER — Telehealth: Payer: Self-pay

## 2019-09-30 NOTE — Telephone Encounter (Signed)
Please see my phone note 09/30/19. I have spoken to patient's wife as well as [facility].

## 2019-09-30 NOTE — Telephone Encounter (Signed)
I spoke with patient's wife [name] per DPR & let her know that patient as of today should STOP aspirin until after surgery. She stated that she would make sure that he d/c this for  Now. I  Let her know that I have faxed form to [facility] I asked that she follow-up & make sure with physician doing surgery that it as okay he would only be off aspirin two days.  I then called [facility] speaking to Ginger who conformed that fax was received. She stated that she would make physician/physcian's nurse aware of him not being off aspirin any more than two days prior to surgery.

## 2019-09-30 NOTE — Telephone Encounter (Signed)
I don't recall seeing this paper prior to today.  I wasn't contacted about this prior to this note. Would stop aspirin in the meantime.   If the surgeon isn't okay with proceeding with surgery based on that period of time off aspirin, then he'll need to reschedule surgery.  Thanks.

## 2019-09-30 NOTE — Telephone Encounter (Signed)
Noted. Thanks.

## 2019-10-02 ENCOUNTER — Other Ambulatory Visit: Payer: Self-pay | Admitting: Family Medicine

## 2019-10-03 DIAGNOSIS — S01412S Laceration without foreign body of left cheek and temporomandibular area, sequela: Secondary | ICD-10-CM | POA: Diagnosis not present

## 2019-10-03 DIAGNOSIS — H04562 Stenosis of left lacrimal punctum: Secondary | ICD-10-CM | POA: Diagnosis not present

## 2019-10-03 DIAGNOSIS — Z483 Aftercare following surgery for neoplasm: Secondary | ICD-10-CM | POA: Diagnosis not present

## 2019-10-03 DIAGNOSIS — S01412D Laceration without foreign body of left cheek and temporomandibular area, subsequent encounter: Secondary | ICD-10-CM | POA: Diagnosis not present

## 2019-10-03 DIAGNOSIS — S01111D Laceration without foreign body of right eyelid and periocular area, subsequent encounter: Secondary | ICD-10-CM | POA: Diagnosis not present

## 2019-10-03 DIAGNOSIS — C441192 Basal cell carcinoma of skin of left lower eyelid, including canthus: Secondary | ICD-10-CM | POA: Diagnosis not present

## 2019-10-03 DIAGNOSIS — Z85828 Personal history of other malignant neoplasm of skin: Secondary | ICD-10-CM | POA: Diagnosis not present

## 2019-10-03 DIAGNOSIS — Z481 Encounter for planned postprocedural wound closure: Secondary | ICD-10-CM | POA: Diagnosis not present

## 2019-10-03 DIAGNOSIS — S01112S Laceration without foreign body of left eyelid and periocular area, sequela: Secondary | ICD-10-CM | POA: Diagnosis not present

## 2019-10-06 ENCOUNTER — Other Ambulatory Visit: Payer: Self-pay | Admitting: Family Medicine

## 2019-12-29 ENCOUNTER — Other Ambulatory Visit: Payer: Self-pay | Admitting: Family Medicine

## 2020-01-03 ENCOUNTER — Other Ambulatory Visit: Payer: Self-pay | Admitting: Family Medicine

## 2020-01-09 DIAGNOSIS — Z09 Encounter for follow-up examination after completed treatment for conditions other than malignant neoplasm: Secondary | ICD-10-CM | POA: Diagnosis not present

## 2020-01-09 DIAGNOSIS — H04222 Epiphora due to insufficient drainage, left lacrimal gland: Secondary | ICD-10-CM | POA: Diagnosis not present

## 2020-02-01 NOTE — Progress Notes (Deleted)
Cardiology Office Note   Date:  02/01/2020   ID:  Nathan Fernandez, DOB 03-Mar-1941, MRN [id number]  PCP:  Tonia Ghent, MD  Cardiologist:   Jenkins Rouge, MD   No chief complaint on file.     History of Present Illness:  79 y.o. f/u for CAD. CRF;s include DM, HTN , HLD, and previous smoker. Distant MI in 27's. First cath in [place] 2014 with moderate aneurysmal dx in mid LAD and 70% D1 Rx medically  Myovue done 01/12/19 abnormal EF estimated at 32% with large anterior wall MI Deemed high risk due to low EF   Cath 01/21/19 reviewed no real changes from 2014 aneurysmal 50% LAD and 70% D1 EF estimated 35-40% With anteroapical RWMA   He has been active doing Architect work on Doctor, hospital Unable to take ACE/ARB/Entresto due to angioedema  No angina doing well Discussed f/u echo / MRI for EF evaluation    Past Medical History:  Diagnosis Date  . 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   Hosp R facial weakness  . CAD (coronary artery disease)    MI, [place], PTCA; [place] 04/18/13: Distal left main 40%, proximal LAD with several aneurysmal segments, proximal LAD 50% prior to and after aneurysmal segments, area does not appear to flow-limiting, proximal diagonal 70%, ostial circumflex 20%, proximal circumflex 20%, OM1 30-40%, proximal RCA 40%, mid RCA 30%, distal RCA 20%, EF 50% => med Rx  . COPD (chronic obstructive pulmonary disease) ([place])   . DM2 (diabetes mellitus, type 2) ([place])   . History of ETT 1998   wnl  . History of MRI 08/04/09   brain- atrophy sm vess dz  . HLD (hyperlipidemia)   . HTN (hypertension)   . OA (osteoarthritis)     Past Surgical History:  Procedure Laterality Date  . cataract surgery  09/06   repair lens which moved  . LEFT HEART CATH AND CORONARY ANGIOGRAPHY N/A 01/21/2019   Procedure: LEFT HEART CATH AND CORONARY ANGIOGRAPHY;  Surgeon: Burnell Blanks, MD;  Location: [facility];  Service: Cardiovascular;  Laterality: N/A;  . stress myoview   06/29/06   sm distal anteroseptal & apical infarct  . THYROIDECTOMY, PARTIAL  1967   B9 growth     Current Outpatient Medications  Medication Sig Dispense Refill  . albuterol (VENTOLIN HFA) 108 (90 Base) MCG/ACT inhaler Inhale 1-2 puffs into the lungs every 6 (six) hours as needed for shortness of breath. 18 g 1  . aspirin 81 MG EC tablet Take 81 mg by mouth daily.      Marland Kitchen buPROPion (WELLBUTRIN SR) 150 MG 12 hr tablet TAKE 1 TABLET BY MOUTH TWICE A DAY 180 tablet 3  . carvedilol (COREG) 6.25 MG tablet Take 0.5 tablets (3.125 mg total) by mouth 2 (two) times daily.    . cyclobenzaprine (FLEXERIL) 5 MG tablet Take 1 tablet (5 mg total) by mouth 3 (three) times daily as needed for muscle spasms.    Marland Kitchen gemfibrozil (LOPID) 600 MG tablet TAKE 1 TABLET BY MOUTH TWICE A DAY 180 tablet 3  . ibuprofen (ADVIL,MOTRIN) 200 MG tablet Take 400 mg by mouth every 6 (six) hours as needed (for pain).    . indomethacin (INDOCIN) 50 MG capsule Take 1 capsule (50 mg total) by mouth 2 (two) times daily as needed (for gout/take with food).    . isosorbide mononitrate (IMDUR) 30 MG 24 hr tablet Take 0.5 tablets (15 mg total) by mouth daily. 45 tablet  3  . ketoconazole (NIZORAL) 2 % cream Apply 1 application topically 2 (two) times daily as needed for irritation. 15 g 1  . meclizine (ANTIVERT) 12.5 MG tablet Take 1-2 tablets (12.5-25 mg total) by mouth 3 (three) times daily as needed for dizziness.    . nitroGLYCERIN (NITROSTAT) 0.4 MG SL tablet Place 1 tablet (0.4 mg total) under the tongue every 5 (five) minutes as needed for chest pain. Up to 3 doses 25 tablet 3  . Omega-3 Fatty Acids (FISH OIL) 1000 MG CAPS Take 1,000 mg by mouth daily.     . pravastatin (PRAVACHOL) 40 MG tablet TAKE 1 TABLET BY MOUTH EVERYDAY AT BEDTIME 90 tablet 2  . tamsulosin (FLOMAX) 0.4 MG CAPS capsule Take 2 capsules (0.8 mg total) by mouth daily.    . traZODone (DESYREL) 100 MG tablet TAKE 2 TABLETS (200 MG TOTAL) BY MOUTH AT BEDTIME. 180  tablet 0  . vitamin E 1000 UNIT capsule Take 1,000 Units by mouth daily.     No current facility-administered medications for this visit.    Allergies:   Ace inhibitors, Angiotensin receptor blockers, and Enalapril    Social History:  The patient  reports that he has quit smoking. His smoking use included cigars. He has never used smokeless tobacco. He reports previous alcohol use. He reports that he does not use drugs.   Family History:  The patient's family history includes Aneurysm in his mother; Heart disease in his father and son; Hypertension in his father and mother; Stroke in his mother.    ROS:  Please see the history of present illness.   Otherwise, review of systems are positive for none.   All other systems are reviewed and negative.    PHYSICAL EXAM: VS:  There were no vitals taken for this visit. , BMI There is no height or weight on file to calculate BMI. Affect appropriate Healthy:  appears stated age 79: normal Neck supple with no adenopathy JVP normal no bruits no thyromegaly Lungs clear with no wheezing and good diaphragmatic motion Heart:  S1/S2 no murmur, no rub, gallop or click PMI normal Abdomen: benighn, BS positve, no tenderness, no AAA no bruit.  No HSM or HJR Distal pulses intact with no bruits No edema Neuro non-focal mild tremor in hands  Skin warm and dry No muscular weakness    EKG:  SR RBBB rate 82  12/30/17 12/10/18 SR rate 87 LAD RBBB  01/18/19 SR RBBB septal infarct    Recent Labs: 07/07/2019: Hemoglobin 16.1; Platelets 190.0 08/31/2019: ALT 17; BUN 16; Creatinine, Ser 1.07; Potassium 4.5; Sodium 139    Lipid Panel    Component Value Date/Time   CHOL 144 08/31/2019 0856   TRIG 320.0 (H) 08/31/2019 0856   HDL 28.80 (L) 08/31/2019 0856   CHOLHDL 5 08/31/2019 0856   VLDL 64.0 (H) 08/31/2019 0856   LDLCALC 103 (H) 02/16/2017 1130   LDLDIRECT 81.0 08/31/2019 0856      Wt Readings from Last 3 Encounters:  09/06/19 180 lb 2 oz (81.7  kg)  08/30/19 176 lb 6.4 oz (80 kg)  07/07/19 180 lb 2 oz (81.7 kg)      Other studies Reviewed: Additional studies/ records that were reviewed today include: Notes from cardiology 2014 Myovue and cath notes Notes from primary CXR and labs .    ASSESSMENT AND PLAN:  1.  CAD:  Moderate aneurysmal mid LAD and 70% D1 stable by cath 01/21/19 continue medical Rx start coreg 6.25 bid  and imdur 15 mg daily  2. HLD:  Continue statin labs with primary discussed low carb low fat diet 3. DM:  Discussed low carb diet.  Target hemoglobin A1c is 6.5 or less.  Continue current medications. 4. HTN:  Well controlled.  Continue current medications and low sodium Dash type diet.   5. 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  The patient does not have concerns regarding medicines.  The following changes have been made:   Labs/ tests ordered today include:  BMET/ Cardiac MRI for viability and EF    Disposition:   FU with me in a year unless EF worse on MRI     Signed, Jenkins Rouge, MD  02/01/2020 2:09 PM    [facility] [address], [place], [place]  [address] Phone: [phone]; Fax: [phone]

## 2020-02-09 ENCOUNTER — Ambulatory Visit: Payer: Medicare Other | Admitting: Cardiovascular Disease

## 2020-02-10 ENCOUNTER — Other Ambulatory Visit: Payer: Self-pay | Admitting: Cardiovascular Disease

## 2020-02-20 DIAGNOSIS — H04229 Epiphora due to insufficient drainage, unspecified lacrimal gland: Secondary | ICD-10-CM | POA: Diagnosis not present

## 2020-02-20 DIAGNOSIS — H04222 Epiphora due to insufficient drainage, left lacrimal gland: Secondary | ICD-10-CM | POA: Diagnosis not present

## 2020-02-20 DIAGNOSIS — Z09 Encounter for follow-up examination after completed treatment for conditions other than malignant neoplasm: Secondary | ICD-10-CM | POA: Diagnosis not present

## 2020-03-20 DIAGNOSIS — H401131 Primary open-angle glaucoma, bilateral, mild stage: Secondary | ICD-10-CM | POA: Diagnosis not present

## 2020-03-26 ENCOUNTER — Telehealth: Payer: Self-pay | Admitting: Family Medicine

## 2020-03-27 NOTE — Telephone Encounter (Signed)
Electronic refill request. Trazodone Last office visit:   09/06/2019 Last Filled:    180 tablet 0 01/03/2020  Please advise.

## 2020-03-28 NOTE — Telephone Encounter (Signed)
Called and got patient scheduled for OV, but patient would also like to talk about concerns and maybe other options than the Trazodone. He says its not working as good anymore and he still has trouble sleeping.

## 2020-03-28 NOTE — Telephone Encounter (Signed)
Sent. Thanks.  Needs DM2 f/u with A1c at the visit.  Please schedule.

## 2020-04-02 ENCOUNTER — Encounter: Payer: Self-pay | Admitting: Family Medicine

## 2020-04-02 ENCOUNTER — Ambulatory Visit (INDEPENDENT_AMBULATORY_CARE_PROVIDER_SITE_OTHER): Payer: Medicare Other | Admitting: Family Medicine

## 2020-04-02 ENCOUNTER — Ambulatory Visit (INDEPENDENT_AMBULATORY_CARE_PROVIDER_SITE_OTHER)
Admission: RE | Admit: 2020-04-02 | Discharge: 2020-04-02 | Disposition: A | Discharge status: Home / Self Care | Payer: Medicare Other | Source: Ambulatory Visit | Attending: Family Medicine | Admitting: Family Medicine

## 2020-04-02 ENCOUNTER — Other Ambulatory Visit: Payer: Self-pay

## 2020-04-02 VITALS — BP 136/86 | HR 87 | Temp 97.2°F | Ht 69.0 in | Wt 175.2 lb

## 2020-04-02 DIAGNOSIS — J449 Chronic obstructive pulmonary disease, unspecified: Secondary | ICD-10-CM

## 2020-04-02 DIAGNOSIS — E119 Type 2 diabetes mellitus without complications: Secondary | ICD-10-CM | POA: Diagnosis not present

## 2020-04-02 DIAGNOSIS — G4700 Insomnia, unspecified: Secondary | ICD-10-CM

## 2020-04-02 LAB — POCT GLYCOSYLATED HEMOGLOBIN (HGB A1C): Hemoglobin A1C: 8 % — AB (ref 4.0–5.6)

## 2020-04-02 MED ORDER — BUPROPION HCL ER (SR) 150 MG PO TB12: 150.0000 mg | ORAL_TABLET | Freq: Every morning | ORAL | Status: DC

## 2020-04-02 NOTE — Patient Instructions (Signed)
Go to the lab on the way out.   If you have mychart we'll likely use that to update you.    Cut the wellbutrin back to 1 tab in the AM and see if your sleep is better.  If not, then let me know.  Recheck in about 6 months at a physical/yearly visit with labs.  Take care.  Glad to see you.

## 2020-04-02 NOTE — Progress Notes (Signed)
This visit occurred during the SARS-CoV-2 public health emergency.  Safety protocols were in place, including screening questions prior to the visit, additional usage of staff PPE, and extensive cleaning of exam room while observing appropriate contact time as indicated for disinfecting solutions.  Diabetes:  No meds.   Hypoglycemic episodes: no sx Hyperglycemic episodes: no sx  Feet problems: no Blood Sugars averaging: not checked.  Eye exam within last year: done about 2 weeks ago.  (has seen Dr. Luberta Mutter) A1c 8.  D/w pt at OV.  He cut back on some sweets in the meantime.   He is not smoking now.  He was asking about stopping wellbutrin but has done well on med.  He still has some sputum, whitish.  This has come back in the meantime, over the last year.  No bloody sputum.  Rare SABA use.    Insomnia.  He has had some episodic relief from trazodone but sometimes o/w any help.  Has taken up to 200mg  trazodone at night.  We talked about sleep hygiene.  He is napping some in the day, up to 2-3 hours.    He had his Covid vaccine.  Meds, vitals, and allergies reviewed.   ROS: Per HPI unless specifically indicated in ROS section   GEN: nad, alert and oriented HEENT: ncat NECK: supple w/o LA CV: rrr. PULM: ctab, no inc wob ABD: soft, +bs EXT: no edema SKIN: no acute rash   Recheck pulse ox 92% B hands.

## 2020-04-04 ENCOUNTER — Emergency Department (HOSPITAL_BASED_OUTPATIENT_CLINIC_OR_DEPARTMENT_OTHER): Payer: Medicare Other

## 2020-04-04 ENCOUNTER — Encounter (HOSPITAL_BASED_OUTPATIENT_CLINIC_OR_DEPARTMENT_OTHER): Payer: Self-pay

## 2020-04-04 ENCOUNTER — Other Ambulatory Visit: Payer: Self-pay

## 2020-04-04 ENCOUNTER — Inpatient Hospital Stay (HOSPITAL_BASED_OUTPATIENT_CLINIC_OR_DEPARTMENT_OTHER)
Admission: EM | Admit: 2020-04-04 | Discharge: 2020-04-07 | DRG: 699 | Disposition: A | Discharge status: Home / Self Care | Payer: Medicare Other | Attending: Internal Medicine | Admitting: Internal Medicine

## 2020-04-04 DIAGNOSIS — Z7982 Long term (current) use of aspirin: Secondary | ICD-10-CM

## 2020-04-04 DIAGNOSIS — E119 Type 2 diabetes mellitus without complications: Secondary | ICD-10-CM | POA: Diagnosis not present

## 2020-04-04 DIAGNOSIS — M5430 Sciatica, unspecified side: Secondary | ICD-10-CM | POA: Diagnosis not present

## 2020-04-04 DIAGNOSIS — N280 Ischemia and infarction of kidney: Secondary | ICD-10-CM

## 2020-04-04 DIAGNOSIS — I10 Essential (primary) hypertension: Secondary | ICD-10-CM | POA: Diagnosis not present

## 2020-04-04 DIAGNOSIS — I4891 Unspecified atrial fibrillation: Secondary | ICD-10-CM | POA: Diagnosis not present

## 2020-04-04 DIAGNOSIS — E7800 Pure hypercholesterolemia, unspecified: Secondary | ICD-10-CM | POA: Diagnosis not present

## 2020-04-04 DIAGNOSIS — Z8249 Family history of ischemic heart disease and other diseases of the circulatory system: Secondary | ICD-10-CM

## 2020-04-04 DIAGNOSIS — Z20822 Contact with and (suspected) exposure to covid-19: Secondary | ICD-10-CM | POA: Diagnosis present

## 2020-04-04 DIAGNOSIS — E1169 Type 2 diabetes mellitus with other specified complication: Secondary | ICD-10-CM

## 2020-04-04 DIAGNOSIS — Z79899 Other long term (current) drug therapy: Secondary | ICD-10-CM

## 2020-04-04 DIAGNOSIS — I110 Hypertensive heart disease with heart failure: Secondary | ICD-10-CM | POA: Diagnosis present

## 2020-04-04 DIAGNOSIS — N179 Acute kidney failure, unspecified: Secondary | ICD-10-CM | POA: Diagnosis not present

## 2020-04-04 DIAGNOSIS — N138 Other obstructive and reflux uropathy: Secondary | ICD-10-CM | POA: Diagnosis present

## 2020-04-04 DIAGNOSIS — Z87891 Personal history of nicotine dependence: Secondary | ICD-10-CM | POA: Diagnosis not present

## 2020-04-04 DIAGNOSIS — Z888 Allergy status to other drugs, medicaments and biological substances status: Secondary | ICD-10-CM

## 2020-04-04 DIAGNOSIS — I252 Old myocardial infarction: Secondary | ICD-10-CM

## 2020-04-04 DIAGNOSIS — D735 Infarction of spleen: Secondary | ICD-10-CM

## 2020-04-04 DIAGNOSIS — E785 Hyperlipidemia, unspecified: Secondary | ICD-10-CM | POA: Diagnosis present

## 2020-04-04 DIAGNOSIS — I2510 Atherosclerotic heart disease of native coronary artery without angina pectoris: Secondary | ICD-10-CM | POA: Diagnosis present

## 2020-04-04 DIAGNOSIS — J439 Emphysema, unspecified: Secondary | ICD-10-CM | POA: Diagnosis not present

## 2020-04-04 DIAGNOSIS — J449 Chronic obstructive pulmonary disease, unspecified: Secondary | ICD-10-CM | POA: Diagnosis present

## 2020-04-04 DIAGNOSIS — I5022 Chronic systolic (congestive) heart failure: Secondary | ICD-10-CM | POA: Diagnosis not present

## 2020-04-04 DIAGNOSIS — I748 Embolism and thrombosis of other arteries: Secondary | ICD-10-CM | POA: Diagnosis not present

## 2020-04-04 DIAGNOSIS — Z03818 Encounter for observation for suspected exposure to other biological agents ruled out: Secondary | ICD-10-CM | POA: Diagnosis not present

## 2020-04-04 DIAGNOSIS — M1990 Unspecified osteoarthritis, unspecified site: Secondary | ICD-10-CM | POA: Diagnosis not present

## 2020-04-04 DIAGNOSIS — I4510 Unspecified right bundle-branch block: Secondary | ICD-10-CM | POA: Diagnosis not present

## 2020-04-04 DIAGNOSIS — N401 Enlarged prostate with lower urinary tract symptoms: Secondary | ICD-10-CM | POA: Diagnosis present

## 2020-04-04 DIAGNOSIS — R109 Unspecified abdominal pain: Secondary | ICD-10-CM | POA: Diagnosis not present

## 2020-04-04 DIAGNOSIS — Z823 Family history of stroke: Secondary | ICD-10-CM

## 2020-04-04 HISTORY — DX: Infarction of spleen: D73.5

## 2020-04-04 LAB — URINALYSIS, ROUTINE W REFLEX MICROSCOPIC
Bilirubin Urine: NEGATIVE
Glucose, UA: 100 mg/dL — AB
Ketones, ur: NEGATIVE mg/dL
Leukocytes,Ua: NEGATIVE
Nitrite: NEGATIVE
Protein, ur: >300 mg/dL — AB
Specific Gravity, Urine: >1.03 — ABNORMAL HIGH (ref 1.005–1.030)
pH: 6 (ref 5.0–8.0)

## 2020-04-04 LAB — COMPREHENSIVE METABOLIC PANEL
ALT: 15 U/L (ref 0–44)
AST: 18 U/L (ref 15–41)
Albumin: 4 g/dL (ref 3.5–5.0)
Alkaline Phosphatase: 57 U/L (ref 38–126)
Anion gap: 10 (ref 5–15)
BUN: 10 mg/dL (ref 8–23)
CO2: 30 mmol/L (ref 22–32)
Calcium: 10.1 mg/dL (ref 8.9–10.3)
Chloride: 95 mmol/L — ABNORMAL LOW (ref 98–111)
Creatinine, Ser: 0.81 mg/dL (ref 0.61–1.24)
GFR calc Af Amer: >60 mL/min (ref >60)
GFR calc non Af Amer: >60 mL/min (ref >60)
Glucose, Bld: 195 mg/dL — ABNORMAL HIGH (ref 70–99)
Potassium: 4.2 mmol/L (ref 3.5–5.1)
Sodium: 135 mmol/L (ref 135–145)
Total Bilirubin: 0.7 mg/dL (ref 0.3–1.2)
Total Protein: 7.8 g/dL (ref 6.5–8.1)

## 2020-04-04 LAB — CBC WITH DIFFERENTIAL/PLATELET
Abs Immature Granulocytes: 0.06 10*3/uL (ref 0.00–0.07)
Basophils Absolute: 0 10*3/uL (ref 0.0–0.1)
Basophils Relative: 0 %
Eosinophils Absolute: 0.3 10*3/uL (ref 0.0–0.5)
Eosinophils Relative: 3 %
HCT: 50.1 % (ref 39.0–52.0)
Hemoglobin: 17.3 g/dL — ABNORMAL HIGH (ref 13.0–17.0)
Immature Granulocytes: 1 %
Lymphocytes Relative: 17 %
Lymphs Abs: 1.9 10*3/uL (ref 0.7–4.0)
MCH: 30.8 pg (ref 26.0–34.0)
MCHC: 34.5 g/dL (ref 30.0–36.0)
MCV: 89.3 fL (ref 80.0–100.0)
Monocytes Absolute: 1.2 10*3/uL — ABNORMAL HIGH (ref 0.1–1.0)
Monocytes Relative: 11 %
Neutro Abs: 7.9 10*3/uL — ABNORMAL HIGH (ref 1.7–7.7)
Neutrophils Relative %: 68 %
Platelets: 234 10*3/uL (ref 150–400)
RBC: 5.61 MIL/uL (ref 4.22–5.81)
RDW: 12.1 % (ref 11.5–15.5)
WBC: 11.4 10*3/uL — ABNORMAL HIGH (ref 4.0–10.5)
nRBC: 0 % (ref 0.0–0.2)

## 2020-04-04 LAB — TROPONIN I (HIGH SENSITIVITY): Troponin I (High Sensitivity): 9 ng/L (ref <18)

## 2020-04-04 LAB — URINALYSIS, MICROSCOPIC (REFLEX): WBC, UA: NONE SEEN WBC/hpf (ref 0–5)

## 2020-04-04 LAB — LIPASE, BLOOD: Lipase: 25 U/L (ref 11–51)

## 2020-04-04 MED ORDER — HEPARIN BOLUS VIA INFUSION
4500.0000 [IU] | Freq: Once | INTRAVENOUS | Status: AC
  Administered 2020-04-04: 4500 [IU] via INTRAVENOUS

## 2020-04-04 MED ORDER — IOHEXOL 300 MG/ML  SOLN
100.0000 mL | Freq: Once | INTRAMUSCULAR | Status: AC
  Administered 2020-04-04: 100 mL via INTRAVENOUS

## 2020-04-04 MED ORDER — ONDANSETRON HCL 4 MG/2ML IJ SOLN
4.0000 mg | Freq: Once | INTRAMUSCULAR | Status: AC
  Administered 2020-04-04: 4 mg via INTRAVENOUS
  Filled 2020-04-04: qty 2

## 2020-04-04 MED ORDER — MORPHINE SULFATE (PF) 4 MG/ML IV SOLN
4.0000 mg | Freq: Once | INTRAVENOUS | Status: AC
  Administered 2020-04-04: 22:00:00 4 mg via INTRAVENOUS
  Filled 2020-04-04: qty 1

## 2020-04-04 MED ORDER — HEPARIN (PORCINE) 25000 UT/250ML-% IV SOLN
1300.0000 [IU]/h | INTRAVENOUS | Status: DC
  Administered 2020-04-04: 1300 [IU]/h via INTRAVENOUS
  Filled 2020-04-04: qty 250

## 2020-04-04 NOTE — ED Triage Notes (Signed)
Pt c/o left side abd pain and nausea started 3am-denies v/d-NAD-steady gait

## 2020-04-04 NOTE — ED Provider Notes (Signed)
[facility] EMERGENCY DEPARTMENT Provider Note   CSN: [id number] Arrival date & time: 04/04/20  1716     History Chief Complaint  Patient presents with  . Abdominal Pain    HPI   Blood pressure (!) 164/109, pulse 91, temperature 98 F (36.7 C), temperature source Oral, resp. rate 16, height 5\' 9"  (1.753 m), weight 78.8 kg, SpO2 94 %.  Nathan Fernandez is a 79 y.o. male complaining of left-sided abdominal pain woke him from sleep at 3 AM he went to the bathroom thinking that would help with little relief he went back to bed sleeping on his right side he was woken from sleep with severe pain on the left side again.  There was no associated fever, chills, nausea, vomiting, change in stool habits including diarrhea, melena, hematochezia.  He ate a banana and mayonnaise sandwich before bed last night.  He does not take a antacid but he has been told that he should do that.  Vitals:   04/04/20 1727 04/04/20 2135  BP: (!) 164/109 (!) 190/115  Pulse: 91 90  Resp: 16 16  Temp: 98 F (36.7 C)   TempSrc: Oral   SpO2: 94% 92%  Weight: 78.8 kg   Height: 5\' 9"  (1.753 m)     Medications  heparin ADULT infusion 100 units/mL (25000 units/268mL sodium chloride 0.45%) (1,300 Units/hr Intravenous New Bag/Given 04/04/20 2145)  iohexol (OMNIPAQUE) 300 MG/ML solution 100 mL (100 mLs Intravenous Contrast Given 04/04/20 1844)  heparin bolus via infusion 4,500 Units (4,500 Units Intravenous Bolus from Bag 04/04/20 2144)  morphine 4 MG/ML injection 4 mg (4 mg Intravenous Given 04/04/20 2143)  ondansetron (ZOFRAN) injection 4 mg (4 mg Intravenous Given 04/04/20 2143)          Past Medical History:  Diagnosis Date  . 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   Hosp R facial weakness  . CAD (coronary artery disease)    MI, [place], PTCA; [place] 04/18/13: Distal left main 40%, proximal LAD with several aneurysmal segments, proximal LAD 50% prior to and after aneurysmal segments, area does not appear to  flow-limiting, proximal diagonal 70%, ostial circumflex 20%, proximal circumflex 20%, OM1 30-40%, proximal RCA 40%, mid RCA 30%, distal RCA 20%, EF 50% => med Rx  . COPD (chronic obstructive pulmonary disease) ([place])   . DM2 (diabetes mellitus, type 2) ([place])   . History of ETT 1998   wnl  . History of MRI 08/04/09   brain- atrophy sm vess dz  . HLD (hyperlipidemia)   . HTN (hypertension)   . OA (osteoarthritis)     Patient Active Problem List   Diagnosis Date Noted  . 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 Noncompliance with immunization regimen 09/11/2019  . Hemoptysis 07/10/2019  . Abnormal cardiovascular stress test   . Tremor 09/02/2018  . Advance care planning 09/02/2018  . Cold intolerance 09/02/2018  . Balanitis 03/12/2018  . Dry mouth 03/12/2018  . Shoulder pain 01/05/2018  . Hypoxia 12/29/2017  . Wrist pain 12/29/2017  . Lower back pain 02/18/2017  . Lower urinary tract symptoms (LUTS) 02/11/2016  . Bilateral knee pain 05/25/2015  . Benign paroxysmal positional vertigo 02/18/2015  . Abdominal wall symptom 02/18/2015  . Gout 05/04/2014  . Healthcare maintenance 04/18/2014  . COPD (chronic obstructive pulmonary disease) ([place]) 03/25/2013  . CAD (coronary artery disease) 03/03/2013  . Other malaise and fatigue 02/22/2013  . Chest wall pain 05/31/2012  . Heartburn 05/07/2011  . DEPRESSION, MILD 11/28/2010  .  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 Diabetes mellitus ([place]) 03/18/2010  . 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 HYPERCHOLESTEROLEMIA 10/19/2007  . 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 HYPERTENSION, BENIGN 10/19/2007  . ROSACEA 10/19/2007  . DEGENERATIVE JOINT DISEASE 10/19/2007  . Insomnia 10/19/2007    Past Surgical History:  Procedure Laterality Date  . cataract surgery  09/06   repair lens which moved  . EYE SURGERY    . LEFT HEART CATH AND CORONARY ANGIOGRAPHY N/A 01/21/2019   Procedure:  LEFT HEART CATH AND CORONARY ANGIOGRAPHY;  Surgeon: Burnell Blanks, MD;  Location: [facility];  Service: Cardiovascular;  Laterality: N/A;  . stress myoview  06/29/06   sm distal anteroseptal & apical infarct  . THYROIDECTOMY, PARTIAL  1967   B9 growth       Family History  Problem Relation Age of Onset  . Hypertension Mother   . Aneurysm Mother   . Stroke Mother   . Hypertension Father   . Heart disease Father        CAD  . Heart disease Son        MI  . Colon cancer Neg Hx   . Prostate cancer Neg Hx     Social History   Tobacco Use  . Smoking status: Former Smoker    Types: Cigars  . Smokeless tobacco: Never Used  . Tobacco comment: cigar occassionally  Substance Use Topics  . Alcohol use: Not Currently    Alcohol/week: 0.0 standard drinks  . Drug use: No    Home Medications Prior to Admission medications   Medication Sig Start Date End Date Taking? Authorizing Provider  albuterol (VENTOLIN HFA) 108 (90 Base) MCG/ACT inhaler Inhale 1-2 puffs into the lungs every 6 (six) hours as needed for shortness of breath. 09/06/19   Tonia Ghent, MD  aspirin 81 MG EC tablet Take 81 mg by mouth daily.      [provider]  buPROPion (WELLBUTRIN SR) 150 MG 12 hr tablet Take 1 tablet (150 mg total) by mouth in the morning. 04/02/20   Tonia Ghent, MD  carvedilol (COREG) 6.25 MG tablet TAKE 1 TABLET BY MOUTH TWICE A DAY 02/10/20   Josue Hector, MD  cyclobenzaprine (FLEXERIL) 5 MG tablet Take 1 tablet (5 mg total) by mouth 3 (three) times daily as needed for muscle spasms. 07/07/19   Tonia Ghent, MD  gemfibrozil (LOPID) 600 MG tablet TAKE 1 TABLET BY MOUTH TWICE A DAY 12/29/19   Tonia Ghent, MD  ibuprofen (ADVIL,MOTRIN) 200 MG tablet Take 400 mg by mouth every 6 (six) hours as needed (for pain).    [provider]  indomethacin (INDOCIN) 50 MG capsule Take 1 capsule (50 mg total) by mouth 2 (two) times daily as needed (for gout/take  with food). 07/07/19   Tonia Ghent, MD  isosorbide mononitrate (IMDUR) 30 MG 24 hr tablet TAKE 1/2 TABLET BY MOUTH DAILY. 02/10/20   Josue Hector, MD  ketoconazole (NIZORAL) 2 % cream Apply 1 application topically 2 (two) times daily as needed for irritation. 03/11/18   Tonia Ghent, MD  meclizine (ANTIVERT) 12.5 MG tablet Take 1-2 tablets (12.5-25 mg total) by mouth 3 (three) times daily as needed for dizziness. 02/16/15   Tonia Ghent, MD  nitroGLYCERIN (NITROSTAT) 0.4 MG SL tablet Place 1 tablet (0.4 mg total) under the tongue every 5 (five) minutes as needed for chest pain. Up to  3 doses 02/11/19   Josue Hector, MD  Omega-3 Fatty Acids (FISH OIL) 1000 MG CAPS Take 1,000 mg by mouth daily.     [provider]  pravastatin (PRAVACHOL) 40 MG tablet TAKE 1 TABLET BY MOUTH EVERYDAY AT BEDTIME 10/06/19   Tonia Ghent, MD  tamsulosin (FLOMAX) 0.4 MG CAPS capsule Take 2 capsules (0.8 mg total) by mouth daily. 07/07/19   Tonia Ghent, MD  traZODone (DESYREL) 100 MG tablet TAKE 2 TABLETS (200 MG TOTAL) BY MOUTH AT BEDTIME. 03/28/20   Tonia Ghent, MD  vitamin E 1000 UNIT capsule Take 1,000 Units by mouth daily.    [provider]    Allergies    Ace inhibitors, Angiotensin receptor blockers, and Enalapril  Review of Systems   Review of Systems   A complete review of systems was obtained and all systems are negative except as noted in the HPI and PMH.    Physical Exam Updated Vital Signs BP (!) 190/115 (BP Location: Right Arm)   Pulse 90   Temp 98 F (36.7 C) (Oral)   Resp 16   Ht 5\' 9"  (1.753 m)   Wt 78.8 kg   SpO2 92%   BMI 25.65 kg/m   Physical Exam Vitals and nursing note reviewed.  Constitutional:      General: He is not in acute distress.    Appearance: He is well-developed. He is not diaphoretic.  HENT:     Head: Normocephalic and atraumatic.  Eyes:     Conjunctiva/sclera: Conjunctivae normal.     Pupils: Pupils are equal, round, and  reactive to light.  Cardiovascular:     Rate and Rhythm: Normal rate and regular rhythm.  Pulmonary:     Effort: Pulmonary effort is normal.     Breath sounds: Normal breath sounds.  Abdominal:     General: Bowel sounds are normal.     Palpations: Abdomen is soft.     Tenderness: There is abdominal tenderness.     Comments: Mild tenderness in the left upper quadrant and left lower quadrant, no guarding or rebound.  Musculoskeletal:        General: Normal range of motion.     Cervical back: Normal range of motion.  Neurological:     Mental Status: He is alert and oriented to person, place, and time.     ED Results / Procedures / Treatments   Labs (all labs ordered are listed, but only abnormal results are displayed) Labs Reviewed  CBC WITH DIFFERENTIAL/PLATELET - Abnormal; Notable for the following components:      Result Value   WBC 11.4 (*)    Hemoglobin 17.3 (*)    Neutro Abs 7.9 (*)    Monocytes Absolute 1.2 (*)    All other components within normal limits  COMPREHENSIVE METABOLIC PANEL - Abnormal; Notable for the following components:   Chloride 95 (*)    Glucose, Bld 195 (*)    All other components within normal limits  URINALYSIS, ROUTINE W REFLEX MICROSCOPIC - Abnormal; Notable for the following components:   Specific Gravity, Urine >1.030 (*)    Glucose, UA 100 (*)    Hgb urine dipstick TRACE (*)    Protein, ur >300 (*)    All other components within normal limits  URINALYSIS, MICROSCOPIC (REFLEX) - Abnormal; Notable for the following components:   Bacteria, UA RARE (*)    All other components within normal limits  SARS CORONAVIRUS 2 (TAT 6-24 HRS)  LIPASE,  BLOOD  TROPONIN I (HIGH SENSITIVITY)  TROPONIN I (HIGH SENSITIVITY)    EKG EKG Interpretation  Date/Time:  Wednesday April 04 2020 18:39:48 EDT Ventricular Rate:  84 PR Interval:    QRS Duration: 141 QT Interval:  401 QTC Calculation: 474 R Axis:   58 Text Interpretation: Sinus rhythm Ventricular  premature complex Prolonged PR interval Right bundle branch block Otherwise no significant change Confirmed by Deno Etienne [phone]) on 04/04/2020 8:59:38 PM   Radiology DG Chest 2 View  Result Date: 04/04/2020 CLINICAL DATA:  79 year old male with pleuritic pain. EXAM: CHEST - 2 VIEW COMPARISON:  Chest radiographs 04/02/2020 and earlier. FINDINGS: PA and lateral views. Centrilobular emphysema demonstrated on a 2014 CT. Stable lung volumes and mediastinal contours. Visualized tracheal air column is within normal limits. No pneumothorax, pulmonary edema, pleural effusion or acute pulmonary opacity. No acute osseous abnormality identified. Negative visible bowel gas pattern. IMPRESSION: Emphysema (ICD10-J43.9). No acute cardiopulmonary abnormality. Electronically Signed   By: Genevie Ann M.D.   On: 04/04/2020 19:09   CT ABDOMEN PELVIS W CONTRAST  Result Date: 04/04/2020 CLINICAL DATA:  Left-sided abdominal pain, nausea since 3 a.m. EXAM: CT ABDOMEN AND PELVIS WITH CONTRAST TECHNIQUE: Multidetector CT imaging of the abdomen and pelvis was performed using the standard protocol following bolus administration of intravenous contrast. CONTRAST:  167mL OMNIPAQUE IOHEXOL 300 MG/ML  SOLN COMPARISON:  None. FINDINGS: Lower chest: No acute pleural or parenchymal lung disease. Hepatobiliary: No focal liver abnormality is seen. No gallstones, gallbladder wall thickening, or biliary dilatation. Pancreas: Unremarkable. No pancreatic ductal dilatation or surrounding inflammatory changes. Spleen: Wedge-shaped area of decreased attenuation within the spleen measures 5.3 x 7.3 cm, consistent with infarct. There is scattered atherosclerosis within the splenic artery but I do not see any focal occlusion. Adrenals/Urinary Tract: There is abnormal wedge-shaped decreased attenuation within the superolateral aspect left kidney, measuring 3.3 x 4.0 cm. Differential include infarct or focal pyelonephritis, though given the associated  findings within the spleen infarct is suspected. The right kidney enhances normally. No urinary tract calculi or obstructive uropathy. Bladder is unremarkable. The adrenals are normal. Stomach/Bowel: No bowel obstruction or ileus. No bowel wall thickening or inflammatory change. Vascular/Lymphatic: Moderate atherosclerosis throughout the aorta and its branches. No aneurysm or dissection. No pathologic adenopathy. Reproductive: There is heterogeneous enlargement of the prostate, measuring 5.6 x 6.0 cm. Other: No free fluid or free gas. Musculoskeletal: No acute or destructive bony lesions. Multilevel lumbar spondylosis. Reconstructed images demonstrate no additional findings. IMPRESSION: 1. Wedge-shaped splenic hypodensity, most compatible with infarct. 2. Wedge-shaped hypodensity superolateral left kidney. Wall focal pyelonephritis could give this appearance, infarct is more likely given the associated findings within the spleen. Multi-vessel infarct distribution may suggest an embolic source. 3. Enlarged prostate. 4. Atherosclerosis. Electronically Signed   By: Randa Ngo M.D.   On: 04/04/2020 19:12    Procedures Procedures (including critical care time)  Medications Ordered in ED Medications  heparin ADULT infusion 100 units/mL (25000 units/239mL sodium chloride 0.45%) (1,300 Units/hr Intravenous New Bag/Given 04/04/20 2145)  iohexol (OMNIPAQUE) 300 MG/ML solution 100 mL (100 mLs Intravenous Contrast Given 04/04/20 1844)  heparin bolus via infusion 4,500 Units (4,500 Units Intravenous Bolus from Bag 04/04/20 2144)  morphine 4 MG/ML injection 4 mg (4 mg Intravenous Given 04/04/20 2143)  ondansetron (ZOFRAN) injection 4 mg (4 mg Intravenous Given 04/04/20 2143)    ED Course  I have reviewed the triage vital signs and the nursing notes.  Pertinent labs & imaging results that were available  during my care of the patient were reviewed by me and considered in my medical decision making (see chart for  details).    MDM Rules/Calculators/A&P                      Vitals:   04/04/20 1727 04/04/20 2135  BP: (!) 164/109 (!) 190/115  Pulse: 91 90  Resp: 16 16  Temp: 98 F (36.7 C)   TempSrc: Oral   SpO2: 94% 92%  Weight: 78.8 kg   Height: 5\' 9"  (1.753 m)     Medications  heparin ADULT infusion 100 units/mL (25000 units/219mL sodium chloride 0.45%) (1,300 Units/hr Intravenous New Bag/Given 04/04/20 2145)  iohexol (OMNIPAQUE) 300 MG/ML solution 100 mL (100 mLs Intravenous Contrast Given 04/04/20 1844)  heparin bolus via infusion 4,500 Units (4,500 Units Intravenous Bolus from Bag 04/04/20 2144)  morphine 4 MG/ML injection 4 mg (4 mg Intravenous Given 04/04/20 2143)  ondansetron (ZOFRAN) injection 4 mg (4 mg Intravenous Given 04/04/20 2143)    EULAS PLASENCIA is 79 y.o. male presenting with severe left-sided abdominal pain, woke him from sleep, pain is excruciating and has improved over the day after taking some Tums.  No nausea, vomiting, melena, hematochezia or change in bowel or urinary habits.  Abdominal exam nonsurgical.  Patient states that it does feel like acid reflux, he also states that it feels like when he had a heart attack in the past.  There is no associated chest pain or shortness of breath it just hurts him when he takes a deep breath.  EKG with no acute findings.  Blood work reassuring.  Surprisingly CT shows splenic infarct and likely a left renal infarct as well.  Patient with no history of valvular disease, no history of A. fib, no history of fever.  Discussed with attending physician recommends starting heparin.  Admitted to Dr. 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

## 2020-04-04 NOTE — Plan of Care (Signed)
79 yo M CAD, COPD, DM2, HTN, HLD w LUQ abd pain splenic and renal infarct Started on heparin No a.fib  Needed IV morphine for pain control Continued on heparin drip Have had vaccine x2 in March  Accepted to [facility] tele bed obs May need an echo to rule out source  10:16 PM Vinaya Sancho

## 2020-04-04 NOTE — Progress Notes (Signed)
ANTICOAGULATION CONSULT NOTE - Initial Consult  Pharmacy Consult for heparin Indication: Splenic infarct  Allergies  Allergen Reactions  . Ace Inhibitors Swelling    Swelling of the tongue  . Angiotensin Receptor Blockers Swelling    Tongue swelling  . Enalapril Swelling    Tongue swelling    Patient Measurements: Height: 5\' 9"  (175.3 cm) Weight: 78.8 kg (173 lb 11.2 oz) IBW/kg (Calculated) : 70.7 Heparin Dosing Weight: TBW  Vital Signs: Temp: Nathan F (36.7 C) (04/14 1727) Temp Source: Oral (04/14 1727) BP: 164/109 (04/14 1727) Pulse Rate: 91 (04/14 1727)  Labs: Recent Labs    04/04/20 1748  HGB 17.3*  HCT 50.1  PLT 234  CREATININE 0.81    Estimated Creatinine Clearance: 73.9 mL/min (by C-G formula based on SCr of 0.81 mg/dL).   Medical History: Past Medical History:  Diagnosis Date  . 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   Hosp R facial weakness  . CAD (coronary artery disease)    MI, [place], PTCA; [place] 04/18/13: Distal left main 40%, proximal LAD with several aneurysmal segments, proximal LAD 50% prior to and after aneurysmal segments, area does not appear to flow-limiting, proximal diagonal 70%, ostial circumflex 20%, proximal circumflex 20%, OM1 30-40%, proximal RCA 40%, mid RCA 30%, distal RCA 20%, EF 50% => med Rx  . COPD (chronic obstructive pulmonary disease) ([place])   . DM2 (diabetes mellitus, type 2) ([place])   . History of ETT 1998   wnl  . History of MRI 08/04/09   brain- atrophy sm vess dz  . HLD (hyperlipidemia)   . HTN (hypertension)   . OA (osteoarthritis)    Assessment: Nathan Fernandez presenting with abdominal pain and nausea, infarct on CT.  Not on anticoagulation PTA, CBC wnl  Goal of Therapy:  Heparin level 0.3-0.7 units/ml Monitor platelets by anticoagulation protocol: Yes   Plan:  Heparin 4500 units IV x 1, and gtt at 1300 units/hr F/u 8 hour heparin level  Bertis Ruddy, PharmD Clinical Pharmacist ED Pharmacist Phone # [phone] 04/04/2020 9:12  PM

## 2020-04-04 NOTE — ED Notes (Signed)
ED Provider at bedside. 

## 2020-04-05 ENCOUNTER — Inpatient Hospital Stay (HOSPITAL_COMMUNITY): Payer: Medicare Other

## 2020-04-05 DIAGNOSIS — E119 Type 2 diabetes mellitus without complications: Secondary | ICD-10-CM | POA: Diagnosis not present

## 2020-04-05 DIAGNOSIS — I252 Old myocardial infarction: Secondary | ICD-10-CM | POA: Diagnosis not present

## 2020-04-05 DIAGNOSIS — I2510 Atherosclerotic heart disease of native coronary artery without angina pectoris: Secondary | ICD-10-CM | POA: Diagnosis present

## 2020-04-05 DIAGNOSIS — N280 Ischemia and infarction of kidney: Secondary | ICD-10-CM | POA: Diagnosis present

## 2020-04-05 DIAGNOSIS — I110 Hypertensive heart disease with heart failure: Secondary | ICD-10-CM | POA: Diagnosis present

## 2020-04-05 DIAGNOSIS — Z7982 Long term (current) use of aspirin: Secondary | ICD-10-CM | POA: Diagnosis not present

## 2020-04-05 DIAGNOSIS — R0602 Shortness of breath: Secondary | ICD-10-CM | POA: Diagnosis not present

## 2020-04-05 DIAGNOSIS — N138 Other obstructive and reflux uropathy: Secondary | ICD-10-CM | POA: Diagnosis present

## 2020-04-05 DIAGNOSIS — I748 Embolism and thrombosis of other arteries: Secondary | ICD-10-CM

## 2020-04-05 DIAGNOSIS — I4510 Unspecified right bundle-branch block: Secondary | ICD-10-CM | POA: Diagnosis present

## 2020-04-05 DIAGNOSIS — I4891 Unspecified atrial fibrillation: Secondary | ICD-10-CM

## 2020-04-05 DIAGNOSIS — D735 Infarction of spleen: Secondary | ICD-10-CM | POA: Diagnosis not present

## 2020-04-05 DIAGNOSIS — E7800 Pure hypercholesterolemia, unspecified: Secondary | ICD-10-CM | POA: Diagnosis present

## 2020-04-05 DIAGNOSIS — M5430 Sciatica, unspecified side: Secondary | ICD-10-CM | POA: Diagnosis present

## 2020-04-05 DIAGNOSIS — Z20822 Contact with and (suspected) exposure to covid-19: Secondary | ICD-10-CM | POA: Diagnosis present

## 2020-04-05 DIAGNOSIS — N401 Enlarged prostate with lower urinary tract symptoms: Secondary | ICD-10-CM | POA: Diagnosis present

## 2020-04-05 DIAGNOSIS — I5022 Chronic systolic (congestive) heart failure: Secondary | ICD-10-CM | POA: Diagnosis present

## 2020-04-05 DIAGNOSIS — Z888 Allergy status to other drugs, medicaments and biological substances status: Secondary | ICD-10-CM | POA: Diagnosis not present

## 2020-04-05 DIAGNOSIS — M1990 Unspecified osteoarthritis, unspecified site: Secondary | ICD-10-CM | POA: Diagnosis present

## 2020-04-05 DIAGNOSIS — Z8249 Family history of ischemic heart disease and other diseases of the circulatory system: Secondary | ICD-10-CM | POA: Diagnosis not present

## 2020-04-05 DIAGNOSIS — Z87891 Personal history of nicotine dependence: Secondary | ICD-10-CM | POA: Diagnosis not present

## 2020-04-05 DIAGNOSIS — I10 Essential (primary) hypertension: Secondary | ICD-10-CM | POA: Diagnosis not present

## 2020-04-05 DIAGNOSIS — J449 Chronic obstructive pulmonary disease, unspecified: Secondary | ICD-10-CM | POA: Diagnosis present

## 2020-04-05 DIAGNOSIS — N179 Acute kidney failure, unspecified: Secondary | ICD-10-CM | POA: Diagnosis present

## 2020-04-05 DIAGNOSIS — Z79899 Other long term (current) drug therapy: Secondary | ICD-10-CM | POA: Diagnosis not present

## 2020-04-05 DIAGNOSIS — E785 Hyperlipidemia, unspecified: Secondary | ICD-10-CM | POA: Diagnosis present

## 2020-04-05 DIAGNOSIS — Z823 Family history of stroke: Secondary | ICD-10-CM | POA: Diagnosis not present

## 2020-04-05 DIAGNOSIS — R609 Edema, unspecified: Secondary | ICD-10-CM | POA: Diagnosis not present

## 2020-04-05 LAB — CBC
HCT: 46.7 % (ref 39.0–52.0)
Hemoglobin: 15.7 g/dL (ref 13.0–17.0)
MCH: 30.3 pg (ref 26.0–34.0)
MCHC: 33.6 g/dL (ref 30.0–36.0)
MCV: 90 fL (ref 80.0–100.0)
Platelets: 199 10*3/uL (ref 150–400)
RBC: 5.19 MIL/uL (ref 4.22–5.81)
RDW: 11.9 % (ref 11.5–15.5)
WBC: 10.8 10*3/uL — ABNORMAL HIGH (ref 4.0–10.5)
nRBC: 0 % (ref 0.0–0.2)

## 2020-04-05 LAB — GLUCOSE, CAPILLARY
Glucose-Capillary: 242 mg/dL — ABNORMAL HIGH (ref 70–99)
Glucose-Capillary: 156 mg/dL — ABNORMAL HIGH (ref 70–99)

## 2020-04-05 LAB — HEPARIN LEVEL (UNFRACTIONATED)
Heparin Unfractionated: 1.14 IU/mL — ABNORMAL HIGH (ref 0.30–0.70)
Heparin Unfractionated: 0.28 IU/mL — ABNORMAL LOW (ref 0.30–0.70)

## 2020-04-05 LAB — HEMOGLOBIN A1C
Hgb A1c MFr Bld: 8.3 % — ABNORMAL HIGH (ref 4.8–5.6)
Mean Plasma Glucose: 191.51 mg/dL

## 2020-04-05 LAB — SARS CORONAVIRUS 2 BY RT PCR (DIASORIN): SARS Coronavirus 2: NEGATIVE

## 2020-04-05 LAB — ECHOCARDIOGRAM COMPLETE
Height: 69 in
Weight: 2779.2 oz

## 2020-04-05 MED ORDER — ALBUTEROL SULFATE (2.5 MG/3ML) 0.083% IN NEBU: 2.5000 mg | INHALATION_SOLUTION | RESPIRATORY_TRACT | Status: DC | PRN

## 2020-04-05 MED ORDER — TAMSULOSIN HCL 0.4 MG PO CAPS
0.8000 mg | ORAL_CAPSULE | Freq: Every day | ORAL | Status: DC
  Administered 2020-04-05 – 2020-04-07 (×3): 0.8 mg via ORAL
  Filled 2020-04-05 (×3): qty 2

## 2020-04-05 MED ORDER — CYCLOBENZAPRINE HCL 5 MG PO TABS: 5.0000 mg | ORAL_TABLET | Freq: Three times a day (TID) | ORAL | Status: DC | PRN

## 2020-04-05 MED ORDER — ISOSORBIDE MONONITRATE ER 30 MG PO TB24
15.0000 mg | ORAL_TABLET | Freq: Every day | ORAL | Status: DC
  Administered 2020-04-05 – 2020-04-07 (×3): 15 mg via ORAL
  Filled 2020-04-05 (×3): qty 1

## 2020-04-05 MED ORDER — ASPIRIN 81 MG PO TBEC
81.0000 mg | DELAYED_RELEASE_TABLET | Freq: Every day | ORAL | Status: DC
  Filled 2020-04-05: qty 1

## 2020-04-05 MED ORDER — POLYETHYLENE GLYCOL 3350 17 G PO PACK: 17.0000 g | PACK | Freq: Every day | ORAL | Status: DC | PRN

## 2020-04-05 MED ORDER — BUPROPION HCL ER (SR) 150 MG PO TB12
150.0000 mg | ORAL_TABLET | Freq: Every morning | ORAL | Status: DC
  Administered 2020-04-05 – 2020-04-07 (×3): 150 mg via ORAL
  Filled 2020-04-05 (×3): qty 1

## 2020-04-05 MED ORDER — INSULIN ASPART 100 UNIT/ML ~~LOC~~ SOLN
0.0000 [IU] | Freq: Three times a day (TID) | SUBCUTANEOUS | Status: DC
  Administered 2020-04-05: 3 [IU] via SUBCUTANEOUS
  Administered 2020-04-06 – 2020-04-07 (×5): 2 [IU] via SUBCUTANEOUS

## 2020-04-05 MED ORDER — MORPHINE SULFATE (PF) 4 MG/ML IV SOLN: 4.0000 mg | INTRAVENOUS | Status: DC | PRN

## 2020-04-05 MED ORDER — OXYCODONE HCL 5 MG PO TABS
5.0000 mg | ORAL_TABLET | ORAL | Status: DC | PRN
  Administered 2020-04-05 – 2020-04-06 (×2): 5 mg via ORAL
  Filled 2020-04-05 (×2): qty 1

## 2020-04-05 MED ORDER — HEPARIN (PORCINE) 25000 UT/250ML-% IV SOLN
1250.0000 [IU]/h | INTRAVENOUS | Status: DC
  Administered 2020-04-06: 1250 [IU]/h via INTRAVENOUS
  Filled 2020-04-05 (×2): qty 250

## 2020-04-05 MED ORDER — GEMFIBROZIL 600 MG PO TABS
600.0000 mg | ORAL_TABLET | Freq: Two times a day (BID) | ORAL | Status: DC
  Administered 2020-04-05 – 2020-04-07 (×4): 600 mg via ORAL
  Filled 2020-04-05 (×6): qty 1

## 2020-04-05 MED ORDER — MORPHINE SULFATE (PF) 2 MG/ML IV SOLN
2.0000 mg | INTRAVENOUS | Status: DC | PRN
  Administered 2020-04-05: 2 mg via INTRAVENOUS
  Filled 2020-04-05: qty 1

## 2020-04-05 MED ORDER — HEPARIN (PORCINE) 25000 UT/250ML-% IV SOLN
1150.0000 [IU]/h | INTRAVENOUS | Status: DC
  Administered 2020-04-05: 1150 [IU]/h via INTRAVENOUS
  Filled 2020-04-05: qty 250

## 2020-04-05 MED ORDER — PRAVASTATIN SODIUM 40 MG PO TABS
40.0000 mg | ORAL_TABLET | Freq: Every day | ORAL | Status: DC
  Administered 2020-04-05 – 2020-04-07 (×3): 40 mg via ORAL
  Filled 2020-04-05 (×3): qty 1

## 2020-04-05 MED ORDER — TRAZODONE HCL 100 MG PO TABS
200.0000 mg | ORAL_TABLET | Freq: Every day | ORAL | Status: DC
  Administered 2020-04-05 – 2020-04-06 (×2): 200 mg via ORAL
  Filled 2020-04-05 (×2): qty 2

## 2020-04-05 MED ORDER — ACETAMINOPHEN 650 MG RE SUPP: 650.0000 mg | Freq: Four times a day (QID) | RECTAL | Status: DC | PRN

## 2020-04-05 MED ORDER — ASPIRIN EC 81 MG PO TBEC
81.0000 mg | DELAYED_RELEASE_TABLET | Freq: Every day | ORAL | Status: DC
  Administered 2020-04-05 – 2020-04-07 (×3): 81 mg via ORAL
  Filled 2020-04-05 (×3): qty 1

## 2020-04-05 MED ORDER — DOCUSATE SODIUM 100 MG PO CAPS
100.0000 mg | ORAL_CAPSULE | Freq: Two times a day (BID) | ORAL | Status: DC
  Administered 2020-04-05 – 2020-04-07 (×5): 100 mg via ORAL
  Filled 2020-04-05 (×5): qty 1

## 2020-04-05 MED ORDER — ACETAMINOPHEN 325 MG PO TABS: 650.0000 mg | ORAL_TABLET | Freq: Four times a day (QID) | ORAL | Status: DC | PRN

## 2020-04-05 MED ORDER — MORPHINE SULFATE (PF) 4 MG/ML IV SOLN
4.0000 mg | INTRAVENOUS | Status: DC | PRN
  Administered 2020-04-05: 4 mg via INTRAVENOUS
  Filled 2020-04-05: qty 1

## 2020-04-05 MED ORDER — CARVEDILOL 6.25 MG PO TABS
6.2500 mg | ORAL_TABLET | Freq: Two times a day (BID) | ORAL | Status: DC
  Administered 2020-04-05 – 2020-04-07 (×5): 6.25 mg via ORAL
  Filled 2020-04-05 (×5): qty 1

## 2020-04-05 NOTE — Progress Notes (Signed)
ANTICOAGULATION CONSULT NOTE - Follow Up Consult  Pharmacy Consult for Heparin Indication: splenic infarct  Allergies  Allergen Reactions  . Ace Inhibitors Swelling    Swelling of the tongue  . Angiotensin Receptor Blockers Swelling    Tongue swelling  . Enalapril Swelling    Tongue swelling    Patient Measurements: Height: 5\' 9"  (175.3 cm) Weight: 78.8 kg (173 lb 11.2 oz) IBW/kg (Calculated) : 70.7 Heparin Dosing Weight: TBW  Vital Signs: Temp: 98.3 F (36.8 C) (04/15 1622) Temp Source: Oral (04/15 1622) BP: 111/83 (04/15 1622) Pulse Rate: 81 (04/15 1622)  Labs: Recent Labs    04/04/20 1748 04/05/20 0519  HGB 17.3* 15.7  HCT 50.1 46.7  PLT 234 199  HEPARINUNFRC  --  1.14*  CREATININE 0.81  --   TROPONINIHS 9  --     Estimated Creatinine Clearance: 73.9 mL/min (by C-G formula based on SCr of 0.81 mg/dL).   Medications:  Infusions:  . heparin 1,150 Units/hr (04/05/20 1552)    Assessment: 61 YOM presenting with abdominal pain and nausea, infarct on CT.  Not on anticoagulation PTA.  Pharmacy is consulted to dose Heparin IV.  Heparin level 0.28, slightly subtherapeutic on heparin at 1150 units/hr. No infusion issues, IV pump and IV site working correctly per Therapist, sports.  No known IV pauses or infusion stops. No bleeding or complications reported.  Goal of Therapy:  Heparin level 0.3-0.7 units/ml Monitor platelets by anticoagulation protocol: Yes   Plan:  Increase heparin IV infusion to 1200 units/hr Heparin level 8 hours after starting Daily heparin level and CBC Continue to monitor H&H and platelets   Gretta Arab PharmD, BCPS Pager: [phone] 04/05/2020 7:28 PM

## 2020-04-05 NOTE — H&P (Signed)
History and Physical  [name] NWG:[id number] DOB: 1941-03-04 DOA: 04/04/2020   PCP: Joaquim Nam, MD   Patient coming from: Home via [facility]  Chief Complaint: Left upper quadrant abdominal pain  HPI: Nathan Fernandez is a 79 y.o. 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  The patient says that he has been in his usual state of health until this morning at about 3 AM, when he was awoken from sleep with a sharp left upper quadrant abdominal pain.  The pain continued over the course of several hours, shifting position on the left side of his body.  There is no associated nausea, vomiting, shortness of breath, chest pain, etc.  The patient says on review of systems that he has lost about 25 pounds but this was intentional over the last 9 months.  Weston Settle denies any blood in his stool, history of bleeding, history of blood clots, or any history of atrial fibrillation.  ED Course: In the emergency department and med [facility], work-up was relatively unrevealing, with relatively unremarkable vital signs.  The patient had CT scan of the abdomen and pelvis with contrast, which demonstrated a splenic and renal infarction.  The patient was started on heparin drip and transferred to [facility] for further work-up and management.  Review of Systems: Please see HPI for pertinent positives and negatives. A complete 10 system review of systems are otherwise negative.  Past Medical History:  Diagnosis Date  . 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   Hosp R facial weakness  . CAD (coronary artery disease)    MI, [facility], PTCA; [facility] 04/18/13: Distal left main 40%, proximal LAD with several aneurysmal segments, proximal LAD 50% prior to and after aneurysmal segments, area does not appear to  flow-limiting, proximal diagonal 70%, ostial circumflex 20%, proximal circumflex 20%, OM1 30-40%, proximal RCA 40%, mid RCA 30%, distal RCA 20%, EF 50% => med Rx  . COPD (chronic obstructive pulmonary disease) (HCC)   . DM2 (diabetes mellitus, type 2) (HCC)   . History of ETT 1998   wnl  . History of MRI 08/04/09   brain- atrophy sm vess dz  . HLD (hyperlipidemia)   . HTN (hypertension)   . OA (osteoarthritis)    Past Surgical History:  Procedure Laterality Date  . cataract surgery  09/06   repair lens which moved  . EYE SURGERY    . LEFT HEART CATH AND CORONARY ANGIOGRAPHY N/A 01/21/2019   Procedure: LEFT HEART CATH AND CORONARY ANGIOGRAPHY;  Surgeon: Kathleene Hazel, MD;  Location: [facility] INVASIVE CV LAB;  Service: Cardiovascular;  Laterality: N/A;  . stress myoview  06/29/06   sm distal anteroseptal & apical infarct  . THYROIDECTOMY, PARTIAL  1967   B9 growth    Social History:  reports that he has quit smoking. His smoking use included cigars. He has never used smokeless tobacco. He reports previous alcohol use. He reports that he does not use drugs.   Allergies  Allergen Reactions  . Ace Inhibitors Swelling    Swelling of the tongue  . Angiotensin Receptor Blockers Swelling    Tongue swelling  . Enalapril Swelling    Tongue swelling    Family History  Problem Relation Age of Onset  . Hypertension Mother   . Aneurysm Mother   .  Stroke Mother   . Hypertension Father   . Heart disease Father        CAD  . Heart disease Son        MI  . Colon cancer Neg Hx   . Prostate cancer Neg Hx      Prior to Admission medications   Medication Sig Start Date End Date Taking? Authorizing Provider  albuterol (VENTOLIN HFA) 108 (90 Base) MCG/ACT inhaler Inhale 1-2 puffs into the lungs every 6 (six) hours as needed for shortness of breath. 09/06/19   Joaquim Nam, MD  aspirin 81 MG EC tablet Take 81 mg by mouth daily.      [provider]  buPROPion (WELLBUTRIN  SR) 150 MG 12 hr tablet Take 1 tablet (150 mg total) by mouth in the morning. 04/02/20   Joaquim Nam, MD  carvedilol (COREG) 6.25 MG tablet TAKE 1 TABLET BY MOUTH TWICE A DAY 02/10/20   Wendall Stade, MD  cyclobenzaprine (FLEXERIL) 5 MG tablet Take 1 tablet (5 mg total) by mouth 3 (three) times daily as needed for muscle spasms. 07/07/19   Joaquim Nam, MD  gemfibrozil (LOPID) 600 MG tablet TAKE 1 TABLET BY MOUTH TWICE A DAY 12/29/19   Joaquim Nam, MD  ibuprofen (ADVIL,MOTRIN) 200 MG tablet Take 400 mg by mouth every 6 (six) hours as needed (for pain).    [provider]  indomethacin (INDOCIN) 50 MG capsule Take 1 capsule (50 mg total) by mouth 2 (two) times daily as needed (for gout/take with food). 07/07/19   Joaquim Nam, MD  isosorbide mononitrate (IMDUR) 30 MG 24 hr tablet TAKE 1/2 TABLET BY MOUTH DAILY. 02/10/20   Wendall Stade, MD  ketoconazole (NIZORAL) 2 % cream Apply 1 application topically 2 (two) times daily as needed for irritation. 03/11/18   Joaquim Nam, MD  meclizine (ANTIVERT) 12.5 MG tablet Take 1-2 tablets (12.5-25 mg total) by mouth 3 (three) times daily as needed for dizziness. 02/16/15   Joaquim Nam, MD  nitroGLYCERIN (NITROSTAT) 0.4 MG SL tablet Place 1 tablet (0.4 mg total) under the tongue every 5 (five) minutes as needed for chest pain. Up to 3 doses 02/11/19   Wendall Stade, MD  Omega-3 Fatty Acids (FISH OIL) 1000 MG CAPS Take 1,000 mg by mouth daily.     [provider]  pravastatin (PRAVACHOL) 40 MG tablet TAKE 1 TABLET BY MOUTH EVERYDAY AT BEDTIME 10/06/19   Joaquim Nam, MD  tamsulosin (FLOMAX) 0.4 MG CAPS capsule Take 2 capsules (0.8 mg total) by mouth daily. 07/07/19   Joaquim Nam, MD  traZODone (DESYREL) 100 MG tablet TAKE 2 TABLETS (200 MG TOTAL) BY MOUTH AT BEDTIME. 03/28/20   Joaquim Nam, MD  vitamin E 1000 UNIT capsule Take 1,000 Units by mouth daily.    [provider]    Physical Exam: BP (!)  144/96 (BP Location: Left Arm)   Pulse 88   Temp 97.8 F (36.6 C) (Oral)   Resp 20   Ht 5\' 9"  (1.753 m)   Wt 78.8 kg   SpO2 93%   BMI 25.65 kg/m   General:  Alert, oriented, calm, in no acute distress, good historian Eyes: EOMI, clear conjuctivae, white sclerea Neck: supple, no masses, trachea mildline  Cardiovascular: RRR, no murmurs or rubs, no peripheral edema  Respiratory: clear to auscultation bilaterally, no wheezes, no crackles  Abdomen: soft, mildly tender in left upper quadrant with no  rebound or guarding, nondistended, normal bowel tones heard  Skin: dry, no rashes  Musculoskeletal: no joint effusions, normal range of motion  Psychiatric: appropriate affect, normal speech  Neurologic: extraocular muscles intact, clear speech, moving all extremities with intact sensorium            Labs on Admission:  Basic Metabolic Panel: Recent Labs  Lab 04/04/20 1748  NA 135  K 4.2  CL 95*  CO2 30  GLUCOSE 195*  BUN 10  CREATININE 0.81  CALCIUM 10.1   Liver Function Tests: Recent Labs  Lab 04/04/20 1748  AST 18  ALT 15  ALKPHOS 57  BILITOT 0.7  PROT 7.8  ALBUMIN 4.0   Recent Labs  Lab 04/04/20 1748  LIPASE 25   No results for input(s): AMMONIA in the last 168 hours. CBC: Recent Labs  Lab 04/04/20 1748 04/05/20 0519  WBC 11.4* 10.8*  NEUTROABS 7.9*  --   HGB 17.3* 15.7  HCT 50.1 46.7  MCV 89.3 90.0  PLT 234 199   Cardiac Enzymes: No results for input(s): CKTOTAL, CKMB, CKMBINDEX, TROPONINI in the last 168 hours.  BNP (last 3 results) No results for input(s): BNP in the last 8760 hours.  ProBNP (last 3 results) No results for input(s): PROBNP in the last 8760 hours.  CBG: No results for input(s): GLUCAP in the last 168 hours.  Radiological Exams on Admission: DG Chest 2 View  Result Date: 04/04/2020 CLINICAL DATA:  78 year old male with pleuritic pain. EXAM: CHEST - 2 VIEW COMPARISON:  Chest radiographs 04/02/2020 and earlier. FINDINGS: PA  and lateral views. Centrilobular emphysema demonstrated on a 2014 CT. Stable lung volumes and mediastinal contours. Visualized tracheal air column is within normal limits. No pneumothorax, pulmonary edema, pleural effusion or acute pulmonary opacity. No acute osseous abnormality identified. Negative visible bowel gas pattern. IMPRESSION: Emphysema (ICD10-J43.9). No acute cardiopulmonary abnormality. Electronically Signed   By: Odessa Fleming M.D.   On: 04/04/2020 19:09   CT ABDOMEN PELVIS W CONTRAST  Result Date: 04/04/2020 CLINICAL DATA:  Left-sided abdominal pain, nausea since 3 a.m. EXAM: CT ABDOMEN AND PELVIS WITH CONTRAST TECHNIQUE: Multidetector CT imaging of the abdomen and pelvis was performed using the standard protocol following bolus administration of intravenous contrast. CONTRAST:  OMNIPAQUE IOHEXOL 300 MG/ML  SOLN COMPARISON:  None. FINDINGS: Lower chest: No acute pleural or parenchymal lung disease. Hepatobiliary: No focal liver abnormality is seen. No gallstones, gallbladder wall thickening, or biliary dilatation. Pancreas: Unremarkable. No pancreatic ductal dilatation or surrounding inflammatory changes. Spleen: Wedge-shaped area of decreased attenuation within the spleen measures 5.3 x 7.3 cm, consistent with infarct. There is scattered atherosclerosis within the splenic artery but I do not see any focal occlusion. Adrenals/Urinary Tract: There is abnormal wedge-shaped decreased attenuation within the superolateral aspect left kidney, measuring 3.3 x 4.0 cm. Differential include infarct or focal pyelonephritis, though given the associated findings within the spleen infarct is suspected. The right kidney enhances normally. No urinary tract calculi or obstructive uropathy. Bladder is unremarkable. The adrenals are normal. Stomach/Bowel: No bowel obstruction or ileus. No bowel wall thickening or inflammatory change. Vascular/Lymphatic: Moderate atherosclerosis throughout the aorta and its branches.  No aneurysm or dissection. No pathologic adenopathy. Reproductive: There is heterogeneous enlargement of the prostate, measuring 5.6 x 6.0 cm. Other: No free fluid or free gas. Musculoskeletal: No acute or destructive bony lesions. Multilevel lumbar spondylosis. Reconstructed images demonstrate no additional findings. IMPRESSION: 1. Wedge-shaped splenic hypodensity, most compatible with infarct. 2. Wedge-shaped hypodensity superolateral left  kidney. Wall focal pyelonephritis could give this appearance, infarct is more likely given the associated findings within the spleen. Multi-vessel infarct distribution may suggest an embolic source. 3. Enlarged prostate. 4. Atherosclerosis. Electronically Signed   By: Sharlet Salina M.D.   On: 04/04/2020 19:12   Assessment/Plan Present on Admission: . Embolism of splenic artery (HCC) . Splenic infarct  Splenic/renal infarction-unclear etiology, case was discussed with on-call hematology Dr. Dion Body. He recommends the following. - heparin drip  - CT Chest to rule out mass/infarction (will be done 4/16 as patient had contrast today) - Echo - If work-up negative, discharge with 6 months of DOAC and plan outpatient hematology consult - Pain control with p.o. oxycodone, as needed IV morphine -Consider inpatient hematology consultation if further questions arise  CAD-continue aspirin, Imdur and Coreg  Hyperlipidemia-continue Lopid and Pravachol  BPH-continue Flomax  DVT prophylaxis: On heparin drip  Code Status: Full code  Family Communication: No family present  Disposition Plan: Discharge home when ready.  Consults called: Informal consultation with hematology.  Admission status: Inpatient  Time spent: 56 minutes  Allon Costlow Vergie Living MD [facility] Pager [phone]  If 7PM-7AM, please contact night-coverage [email or web address] Password [facility]  04/05/2020, 10:01 AM

## 2020-04-05 NOTE — Progress Notes (Signed)
  Echocardiogram 2D Echocardiogram has been performed.  Nathan Fernandez 04/05/2020, 11:22 AM

## 2020-04-05 NOTE — Progress Notes (Signed)
ANTICOAGULATION CONSULT NOTE - Initial Consult  Pharmacy Consult for heparin Indication: Splenic infarct  Allergies  Allergen Reactions  . Ace Inhibitors Swelling    Swelling of the tongue  . Angiotensin Receptor Blockers Swelling    Tongue swelling  . Enalapril Swelling    Tongue swelling    Patient Measurements: Height: 5\' 9"  (175.3 cm) Weight: 78.8 kg (173 lb 11.2 oz) IBW/kg (Calculated) : 70.7 Heparin Dosing Weight: TBW  Vital Signs: Temp: 97.8 F (36.6 C) (04/15 0849) Temp Source: Oral (04/15 0849) BP: 144/96 (04/15 0849) Pulse Rate: 88 (04/15 0849)  Labs: Recent Labs    04/04/20 1748 04/05/20 0519  HGB 17.3* 15.7  HCT 50.1 46.7  PLT 234 199  HEPARINUNFRC  --  1.14*  CREATININE 0.81  --   TROPONINIHS 9  --     Estimated Creatinine Clearance: 73.9 mL/min (by C-G formula based on SCr of 0.81 mg/dL).   Medical History: Past Medical History:  Diagnosis Date  . 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   Hosp R facial weakness  . CAD (coronary artery disease)    MI, [place], PTCA; [place] 04/18/13: Distal left main 40%, proximal LAD with several aneurysmal segments, proximal LAD 50% prior to and after aneurysmal segments, area does not appear to flow-limiting, proximal diagonal 70%, ostial circumflex 20%, proximal circumflex 20%, OM1 30-40%, proximal RCA 40%, mid RCA 30%, distal RCA 20%, EF 50% => med Rx  . COPD (chronic obstructive pulmonary disease) ([place])   . DM2 (diabetes mellitus, type 2) ([place])   . History of ETT 1998   wnl  . History of MRI 08/04/09   brain- atrophy sm vess dz  . HLD (hyperlipidemia)   . HTN (hypertension)   . OA (osteoarthritis)    Assessment: 55 YOM presenting with abdominal pain and nausea, infarct on CT.  Not on anticoagulation PTA, CBC wnl  Heparin level came back elevated. Would hold for 1 hour and decrease rate.  Goal of Therapy:  Heparin level 0.3-0.7 units/ml Monitor platelets by anticoagulation protocol: Yes   Plan:  Hold heparin drip  x 1 hour Restart heparin infusion at 1150 units/hr F/u 8 hour heparin level  Alanda Slim, PharmD, [facility] Clinical Pharmacist Please see AMION for all Pharmacists' Contact Phone Numbers 04/05/2020, 10:16 AM

## 2020-04-05 NOTE — ED Notes (Addendum)
Called Debbie RN receiving nurse at [employer], will call back for report , unable to take at this time

## 2020-04-05 NOTE — ED Notes (Signed)
Report given to [employer] with [employer] 

## 2020-04-05 NOTE — Assessment & Plan Note (Signed)
Lungs are clear but likely with some persistent sputum production related to COPD.  Reasonable to recheck chest x-ray today.  See notes on imaging.  Recheck pulse ox 92% on the bilateral hands.  Okay for outpatient follow-up.

## 2020-04-05 NOTE — Assessment & Plan Note (Signed)
Discussed options.  Discussed sleep hygiene.  He can cut back on Wellbutrin and only take it in the morning and see if that helps with his sleep.  He will update me as needed.  Still okay for outpatient follow-up.

## 2020-04-05 NOTE — Assessment & Plan Note (Signed)
A1c 8.  Discussed with patient about diet and exercise.  He cut back on some sweets in the meantime.  Not on medication currently.  Goal to avoid hypoglycemia.  He thinks he can get his A1c lower with diet and exercise.  Recheck in 6 months.  Update me as needed in the meantime.  He agrees.  See after visit summary.

## 2020-04-05 NOTE — Plan of Care (Signed)
  Problem: Health Behavior/Discharge Planning: Goal: Ability to manage health-related needs will improve Outcome: Progressing   

## 2020-04-05 NOTE — ED Notes (Signed)
Report given to Aurelio Brash, receiving nurse at [facility]

## 2020-04-06 ENCOUNTER — Inpatient Hospital Stay (HOSPITAL_COMMUNITY): Payer: Medicare Other

## 2020-04-06 ENCOUNTER — Ambulatory Visit: Payer: Medicare Other | Admitting: Cardiovascular Disease

## 2020-04-06 DIAGNOSIS — N401 Enlarged prostate with lower urinary tract symptoms: Secondary | ICD-10-CM | POA: Diagnosis not present

## 2020-04-06 DIAGNOSIS — E119 Type 2 diabetes mellitus without complications: Secondary | ICD-10-CM

## 2020-04-06 DIAGNOSIS — R609 Edema, unspecified: Secondary | ICD-10-CM

## 2020-04-06 DIAGNOSIS — D735 Infarction of spleen: Secondary | ICD-10-CM

## 2020-04-06 DIAGNOSIS — I748 Embolism and thrombosis of other arteries: Secondary | ICD-10-CM | POA: Diagnosis not present

## 2020-04-06 DIAGNOSIS — M5430 Sciatica, unspecified side: Secondary | ICD-10-CM

## 2020-04-06 DIAGNOSIS — N138 Other obstructive and reflux uropathy: Secondary | ICD-10-CM

## 2020-04-06 DIAGNOSIS — I10 Essential (primary) hypertension: Secondary | ICD-10-CM

## 2020-04-06 LAB — BASIC METABOLIC PANEL
Anion gap: 10 (ref 5–15)
BUN: 24 mg/dL — ABNORMAL HIGH (ref 8–23)
CO2: 27 mmol/L (ref 22–32)
Calcium: 8.9 mg/dL (ref 8.9–10.3)
Chloride: 98 mmol/L (ref 98–111)
Creatinine, Ser: 1.41 mg/dL — ABNORMAL HIGH (ref 0.61–1.24)
GFR calc Af Amer: 55 mL/min — ABNORMAL LOW (ref >60)
GFR calc non Af Amer: 47 mL/min — ABNORMAL LOW (ref >60)
Glucose, Bld: 184 mg/dL — ABNORMAL HIGH (ref 70–99)
Potassium: 4.3 mmol/L (ref 3.5–5.1)
Sodium: 135 mmol/L (ref 135–145)

## 2020-04-06 LAB — HEPARIN LEVEL (UNFRACTIONATED)
Heparin Unfractionated: 0.4 IU/mL (ref 0.30–0.70)
Heparin Unfractionated: 0.3 IU/mL (ref 0.30–0.70)
Heparin Unfractionated: 0.24 IU/mL — ABNORMAL LOW (ref 0.30–0.70)

## 2020-04-06 LAB — CBC
HCT: 44.1 % (ref 39.0–52.0)
Hemoglobin: 14.8 g/dL (ref 13.0–17.0)
MCH: 30.3 pg (ref 26.0–34.0)
MCHC: 33.6 g/dL (ref 30.0–36.0)
MCV: 90.2 fL (ref 80.0–100.0)
Platelets: 173 10*3/uL (ref 150–400)
RBC: 4.89 MIL/uL (ref 4.22–5.81)
RDW: 11.9 % (ref 11.5–15.5)
WBC: 10.9 10*3/uL — ABNORMAL HIGH (ref 4.0–10.5)
nRBC: 0 % (ref 0.0–0.2)

## 2020-04-06 LAB — GLUCOSE, CAPILLARY
Glucose-Capillary: 164 mg/dL — ABNORMAL HIGH (ref 70–99)
Glucose-Capillary: 157 mg/dL — ABNORMAL HIGH (ref 70–99)
Glucose-Capillary: 177 mg/dL — ABNORMAL HIGH (ref 70–99)
Glucose-Capillary: 237 mg/dL — ABNORMAL HIGH (ref 70–99)

## 2020-04-06 MED ORDER — SODIUM CHLORIDE (PF) 0.9 % IJ SOLN
INTRAMUSCULAR | Status: AC
  Filled 2020-04-06: qty 50

## 2020-04-06 MED ORDER — SODIUM CHLORIDE 0.9 % IV SOLN: INTRAVENOUS | Status: DC

## 2020-04-06 MED ORDER — IOHEXOL 350 MG/ML SOLN
80.0000 mL | Freq: Once | INTRAVENOUS | Status: AC | PRN
  Administered 2020-04-06: 80 mL via INTRAVENOUS

## 2020-04-06 NOTE — Progress Notes (Addendum)
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   Clinically no vascular compromise to left lower extremity and likely not related to ischemic event to spleen and left kidney.   Will hold on further work up to decrease contrast exposure, follow as outpatient.

## 2020-04-06 NOTE — Progress Notes (Signed)
Bilateral lower extremity venous duplex has been completed. Preliminary results can be found in CV Proc through chart review.  Results were given to Dr. Cathlean Sauer.  04/06/20 4:24 PM Carlos Levering RVT

## 2020-04-06 NOTE — Progress Notes (Signed)
ANTICOAGULATION CONSULT NOTE - Follow Up Consult  Pharmacy Consult for Heparin Indication: splenic infarct  Allergies  Allergen Reactions  . Ace Inhibitors Swelling    Swelling of the tongue  . Angiotensin Receptor Blockers Swelling    Tongue swelling  . Enalapril Swelling    Tongue swelling    Patient Measurements: Height: 5\' 9"  (175.3 cm) Weight: 78.8 kg (173 lb 11.2 oz) IBW/kg (Calculated) : 70.7 Heparin Dosing Weight: TBW  Vital Signs: Temp: 99.4 F (37.4 C) (04/16 0547) Temp Source: Oral (04/15 2121) BP: 137/83 (04/16 0547) Pulse Rate: 88 (04/16 0547)  Labs: Recent Labs    04/04/20 1748 04/04/20 1748 04/05/20 0519 04/05/20 1942 04/06/20 0510  HGB 17.3*   < > 15.7  --  14.8  HCT 50.1  --  46.7  --  44.1  PLT 234  --  199  --  173  HEPARINUNFRC  --   --  1.14* 0.28* 0.40  CREATININE 0.81  --   --   --   --   TROPONINIHS 9  --   --   --   --    < > = values in this interval not displayed.    Estimated Creatinine Clearance: 73.9 mL/min (by C-G formula based on SCr of 0.81 mg/dL).   Medications:  Infusions:  . heparin 1,200 Units/hr (04/06/20 0200)    Assessment: 62 YOM presenting with abdominal pain and nausea, infarct on CT.  Not on anticoagulation PTA.  Pharmacy is consulted to dose Heparin IV.  Heparin level 0.28, slightly subtherapeutic on heparin at 1150 units/hr. No infusion issues, IV pump and IV site working correctly per Therapist, sports.  No known IV pauses or infusion stops. No bleeding or complications reported. Today, 4/16 0504 HL = 0.40 at goal, no infusion or bleeding issues per RN. H/H plts WNL  Goal of Therapy:  Heparin level 0.3-0.7 units/ml Monitor platelets by anticoagulation protocol: Yes   Plan:  Continue heparin drip at 1200 units/hr Recheck confirmatory HL later today Daily heparin level and CBC Continue to monitor H&H and platelets    Dorrene German 04/06/2020 5:50 AM

## 2020-04-06 NOTE — Progress Notes (Signed)
PROGRESS NOTE    Nathan Fernandez  UJW:[id number] DOB: 01-22-1941 DOA: 04/04/2020 PCP: Joaquim Nam, MD    Brief Narrative:  Patient was admitted to the hospital with working diagnosis of acute splenic and left kidney embolic infarction.  79 year old male who presented with left upper quadrant abdominal pain.  He does have significant past medical history for coronary artery disease, COPD, type 2 diabetes mellitus, hypertension and dyslipidemia.  He reported a sudden abdominal pain around 3 AM that woke him up from his sleep, the pain was constant and sharp in nature, no associated nausea or vomiting.  Initially he was evaluated at [facility], and diagnosed with splenic and renal infarction, he was placed on a heparin drip and transferred to [facility] hospital.  At his arrival his initial physical examination showed a blood pressure 144/96, heart rate 88, temperature 97.8, respiratory rate 20 and oxygen saturation 93%, his lungs are clear to auscultation bilaterally, heart S1-S2 present rhythmic, his abdomen was soft, mild tender in left upper quadrant, no rebound or guarding, no lower extremity edema.  Sodium 135, potassium 4.2, chloride 95, bicarb 30, glucose 195, BUN 10, creatinine 0.81, white count 11.4, hemoglobin 17.3, hematocrit 50.1, platelets 234.  SARS COVID-19 negative.  Urinalysis specific gravity >1.030.  CT of the abdomen with wedge-shaped splenic hypodensity,and wedge-shaped hypodensity left kidney, suggesting embolic source.  Chest radiograph with no infiltrates.  EKG 84 bpm, normal axis, first-degree AV block, right bundle branch block, sinus rhythm, no ST segment or T wave changes.   Patient has been placed on IV heparin with good toleration.    Assessment & Plan:   Principal Problem:   Splenic infarct Active Problems:   HYPERCHOLESTEROLEMIA   HYPERTENSION, BENIGN   BPH with obstruction/lower urinary tract symptoms   Osteoarthritis   SCIATICA, CHRONIC   Diabetes  mellitus (HCC)   Embolism of splenic artery (HCC)   1. Acute splenic and left kidney infarct. Patient will continue with heparin for anticoagulation. Continue work up with CT chest and lower extremity US, to rule out venous thrombosis.   Echocardiogram with no thrombus but reduced LV systolic function down to 35 to 40%.   Oncology has recommended anticoagulation for 6 mo.   2. CAD with systolic heart failure. No signs of acute decompensation, will continue with carvedilol and isosorbide.  3. HTN. Continue blood pressure control with isosorbide and carvedilol.   4. T2DM with dyslipidemia. Continue glucose control with insulin sliding scale. Continue with statin therapy and gemfibrozil.    5. Osteoarthritis.  Continue pain control as outpatient,   6. AKI. Likely pre-renal, high specific gravity on admission urine analysis. Will add isotonic saline and will follow up on renal panel in am. Avoid nephrotoxic medications.   DVT prophylaxis: heparin IV   Code Status:  full Family Communication: no family at the bedside  Disposition Plan/ discharge barriers: patient from home, barrier for dc anticoagulation with IV heparin, pending completion of work up for embolism,    Subjective: Patient continue to have mild to moderate left sided abdominal pain, improved with analgesics, no nausea or vomiting, no chest pain or dyspnea.   Objective: Vitals:   04/05/20 2121 04/06/20 0543 04/06/20 0547 04/06/20 1321  BP: 136/85 120/77 137/83 103/72  Pulse: 94 88 88 79  Resp: 20 20 20 19   Temp: 97.8 F (36.6 C) 99.4 F (37.4 C) 99.4 F (37.4 C) 98 F (36.7 C)  TempSrc: Oral     SpO2: 90% Marland Kitchen)  84% 93% 96%  Weight:      Height:        Intake/Output Summary (Last 24 hours) at 04/06/2020 1521 Last data filed at 04/06/2020 0600 Gross per 24 hour  Intake 638.6 ml  Output 375 ml  Net 263.6 ml   Filed Weights   04/04/20 1727  Weight: 78.8 kg    Examination:   General: Not in pain or dyspnea,  deconditioned  Neurology: Awake and alert, non focal  E ENT: no pallor, no icterus, oral mucosa moist Cardiovascular: No JVD. S1-S2 present, rhythmic, no gallops, rubs, or murmurs. Trace lower extremity edema. Pulmonary: positive breath sounds bilaterally, adequate air movement, no wheezing, rhonchi or rales. Gastrointestinal. Abdomen with no organomegaly, non tender, no rebound or guarding Skin. No rashes Musculoskeletal: no joint deformities     Data Reviewed: I have personally reviewed following labs and imaging studies  CBC: Recent Labs  Lab 04/04/20 1748 04/05/20 0519 04/06/20 0510  WBC 11.4* 10.8* 10.9*  NEUTROABS 7.9*  --   --   HGB 17.3* 15.7 14.8  HCT 50.1 46.7 44.1  MCV 89.3 90.0 90.2  PLT 234 199 173   Basic Metabolic Panel: Recent Labs  Lab 04/04/20 1748 04/06/20 0510  NA 135 135  K 4.2 4.3  CL 95* 98  CO2 30 27  GLUCOSE 195* 184*  BUN 10 24*  CREATININE 0.81 1.41*  CALCIUM 10.1 8.9   GFR: Estimated Creatinine Clearance: 42.5 mL/min (A) (by C-G formula based on SCr of 1.41 mg/dL (H)). Liver Function Tests: Recent Labs  Lab 04/04/20 1748  AST 18  ALT 15  ALKPHOS 57  BILITOT 0.7  PROT 7.8  ALBUMIN 4.0   Recent Labs  Lab 04/04/20 1748  LIPASE 25   No results for input(s): AMMONIA in the last 168 hours. Coagulation Profile: No results for input(s): INR, PROTIME in the last 168 hours. Cardiac Enzymes: No results for input(s): CKTOTAL, CKMB, CKMBINDEX, TROPONINI in the last 168 hours. BNP (last 3 results) No results for input(s): PROBNP in the last 8760 hours. HbA1C: Recent Labs    04/05/20 1359  HGBA1C 8.3*   CBG: Recent Labs  Lab 04/05/20 1619 04/05/20 2124 04/06/20 0802 04/06/20 1202  GLUCAP 242* 156* 164* 157*   Lipid Profile: No results for input(s): CHOL, HDL, LDLCALC, TRIG, CHOLHDL, LDLDIRECT in the last 72 hours. Thyroid Function Tests: No results for input(s): TSH, T4TOTAL, FREET4, T3FREE, THYROIDAB in the last 72  hours. Anemia Panel: No results for input(s): VITAMINB12, FOLATE, FERRITIN, TIBC, IRON, RETICCTPCT in the last 72 hours.    Radiology Studies: I have reviewed all of the imaging during this hospital visit personally     Scheduled Meds: . aspirin EC  81 mg Oral Daily  . buPROPion  150 mg Oral q AM  . carvedilol  6.25 mg Oral BID  . docusate sodium  100 mg Oral BID  . gemfibrozil  600 mg Oral BID  . insulin aspart  0-9 Units Subcutaneous TID WC  . isosorbide mononitrate  15 mg Oral Daily  . pravastatin  40 mg Oral Daily  . tamsulosin  0.8 mg Oral Daily  . traZODone  200 mg Oral QHS   Continuous Infusions: . heparin 1,250 Units/hr (04/06/20 1431)     LOS: 1 day        Danajah Birdsell Annett Gula, MD

## 2020-04-06 NOTE — Progress Notes (Signed)
ANTICOAGULATION CONSULT NOTE - Follow Up Consult  Pharmacy Consult for Heparin Indication: splenic infarct  Allergies  Allergen Reactions  . Ace Inhibitors Swelling    Swelling of the tongue  . Angiotensin Receptor Blockers Swelling    Tongue swelling  . Enalapril Swelling    Tongue swelling    Patient Measurements: Height: 5\' 9"  (175.3 cm) Weight: 78.8 kg (173 lb 11.2 oz) IBW/kg (Calculated) : 70.7 Heparin Dosing Weight: TBW  Vital Signs: Temp: 98 F (36.7 C) (04/16 1321) BP: 103/72 (04/16 1321) Pulse Rate: 79 (04/16 1321)  Labs: Recent Labs    04/04/20 1748 04/04/20 1748 04/05/20 0519 04/05/20 0519 04/05/20 1942 04/06/20 0510 04/06/20 1330  HGB 17.3*   < > 15.7  --   --  14.8  --   HCT 50.1  --  46.7  --   --  44.1  --   PLT 234  --  199  --   --  173  --   HEPARINUNFRC  --   --  1.14*   < > 0.28* 0.40 0.30  CREATININE 0.81  --   --   --   --  1.41*  --   TROPONINIHS 9  --   --   --   --   --   --    < > = values in this interval not displayed.    Estimated Creatinine Clearance: 42.5 mL/min (A) (by C-G formula based on SCr of 1.41 mg/dL (H)).   Medications:  Infusions:  . heparin 1,200 Units/hr (04/06/20 0600)    Assessment: 53 YOM presenting with abdominal pain and nausea, infarct on CT.  Not on anticoagulation PTA.  Pharmacy is consulted to dose Heparin IV.  04/06/2020: - Heparin level at low end of goal range (0.3) on heparin 1200 units/hr - CBC: WNL - No bleeding or infusion related issues noted  Goal of Therapy:  Heparin level 0.3-0.7 units/ml Monitor platelets by anticoagulation protocol: Yes   Plan:  Increase heparin IV infusion to 1250 units/hr Recheck heparin level 8 hours  Daily heparin level and CBC Continue to monitor H&H and platelets  Netta Cedars, PharmD, BCPS 04/06/2020 2:24 PM

## 2020-04-06 NOTE — Plan of Care (Signed)
  Problem: Education: Goal: Knowledge of General Education information will improve Description: Including pain rating scale, medication(s)/side effects and non-pharmacologic comfort measures Outcome: Progressing   Problem: Nutrition: Goal: Adequate nutrition will be maintained Outcome: Progressing   Problem: Coping: Goal: Level of anxiety will decrease Outcome: Progressing   

## 2020-04-07 DIAGNOSIS — I748 Embolism and thrombosis of other arteries: Secondary | ICD-10-CM | POA: Diagnosis not present

## 2020-04-07 DIAGNOSIS — D735 Infarction of spleen: Secondary | ICD-10-CM | POA: Diagnosis not present

## 2020-04-07 DIAGNOSIS — N401 Enlarged prostate with lower urinary tract symptoms: Secondary | ICD-10-CM | POA: Diagnosis not present

## 2020-04-07 DIAGNOSIS — E7800 Pure hypercholesterolemia, unspecified: Secondary | ICD-10-CM

## 2020-04-07 DIAGNOSIS — E119 Type 2 diabetes mellitus without complications: Secondary | ICD-10-CM | POA: Diagnosis not present

## 2020-04-07 LAB — CBC
HCT: 43.7 % (ref 39.0–52.0)
Hemoglobin: 15.1 g/dL (ref 13.0–17.0)
MCH: 30.9 pg (ref 26.0–34.0)
MCHC: 34.6 g/dL (ref 30.0–36.0)
MCV: 89.4 fL (ref 80.0–100.0)
Platelets: 192 10*3/uL (ref 150–400)
RBC: 4.89 MIL/uL (ref 4.22–5.81)
RDW: 11.9 % (ref 11.5–15.5)
WBC: 9.4 10*3/uL (ref 4.0–10.5)
nRBC: 0 % (ref 0.0–0.2)

## 2020-04-07 LAB — BASIC METABOLIC PANEL
Anion gap: 7 (ref 5–15)
BUN: 19 mg/dL (ref 8–23)
CO2: 28 mmol/L (ref 22–32)
Calcium: 8.4 mg/dL — ABNORMAL LOW (ref 8.9–10.3)
Chloride: 100 mmol/L (ref 98–111)
Creatinine, Ser: 0.97 mg/dL (ref 0.61–1.24)
GFR calc Af Amer: >60 mL/min (ref >60)
GFR calc non Af Amer: >60 mL/min (ref >60)
Glucose, Bld: 178 mg/dL — ABNORMAL HIGH (ref 70–99)
Potassium: 4.5 mmol/L (ref 3.5–5.1)
Sodium: 135 mmol/L (ref 135–145)

## 2020-04-07 LAB — GLUCOSE, CAPILLARY
Glucose-Capillary: 171 mg/dL — ABNORMAL HIGH (ref 70–99)
Glucose-Capillary: 166 mg/dL — ABNORMAL HIGH (ref 70–99)

## 2020-04-07 LAB — HEPARIN LEVEL (UNFRACTIONATED): Heparin Unfractionated: 0.4 [IU]/mL (ref 0.30–0.70)

## 2020-04-07 MED ORDER — RIVAROXABAN 15 MG PO TABS: 15.0000 mg | ORAL_TABLET | ORAL | 0 refills | Status: DC

## 2020-04-07 MED ORDER — RIVAROXABAN (XARELTO) VTE STARTER PACK (15 & 20 MG): ORAL_TABLET | ORAL | 0 refills | Status: DC

## 2020-04-07 MED ORDER — RIVAROXABAN 15 MG PO TABS
15.0000 mg | ORAL_TABLET | ORAL | Status: AC
  Administered 2020-04-07: 15 mg via ORAL
  Filled 2020-04-07: qty 1

## 2020-04-07 MED ORDER — HEPARIN (PORCINE) 25000 UT/250ML-% IV SOLN
1400.0000 [IU]/h | INTRAVENOUS | Status: DC
  Administered 2020-04-07 (×2): 1400 [IU]/h via INTRAVENOUS
  Filled 2020-04-07 (×2): qty 250

## 2020-04-07 NOTE — Progress Notes (Signed)
ANTICOAGULATION CONSULT NOTE - Follow Up Consult  Pharmacy Consult for Heparin Indication: splenic and left kidney infarct  Allergies  Allergen Reactions  . Ace Inhibitors Swelling    Swelling of the tongue  . Angiotensin Receptor Blockers Swelling    Tongue swelling  . Enalapril Swelling    Tongue swelling    Patient Measurements: Height: 5\' 9"  (175.3 cm) Weight: 78.8 kg (173 lb 11.2 oz) IBW/kg (Calculated) : 70.7 Heparin Dosing Weight: actual body weight   Vital Signs: Temp: 98.4 F (36.9 C) (04/17 0456) Temp Source: Oral (04/17 0456) BP: 142/87 (04/17 0456) Pulse Rate: 77 (04/17 0456)  Labs: Recent Labs    04/04/20 1748 04/04/20 1748 04/05/20 0519 04/05/20 1942 04/06/20 0510 04/06/20 0510 04/06/20 1330 04/06/20 2236 04/07/20 0837  HGB 17.3*   < > 15.7  --  14.8  --   --   --  15.1  HCT 50.1   < > 46.7  --  44.1  --   --   --  43.7  PLT 234   < > 199  --  173  --   --   --  192  HEPARINUNFRC  --   --  1.14*   < > 0.40   < > 0.30 0.24* 0.40  CREATININE 0.81  --   --   --  1.41*  --   --   --  0.97  TROPONINIHS 9  --   --   --   --   --   --   --   --    < > = values in this interval not displayed.    Estimated Creatinine Clearance: 61.8 mL/min (by C-G formula based on SCr of 0.97 mg/dL).   Assessment: 58 YOM presenting with abdominal pain and nausea, infarct on CT. Not on anticoagulation PTA. Pharmacy is consulted to dose Heparin IV.  04/07/2020: - Heparin level = 0.4 units/mL, now therapeutic  - CBC: WNL  - Per RN, IV site moved from R wrist to R forearm due to some bleeding at IV site.   Goal of Therapy:  Heparin level 0.3-0.7 units/ml Monitor platelets by anticoagulation protocol: Yes   Plan:  - Continue IV heparin infusion at 1400 units/hr - Recheck heparin level in 8 hours to ensure remains within therapeutic range - Daily heparin level and CBC - Continue to monitor closely for bleeding    Lindell Spar, PharmD, BCPS Clinical Pharmacist   04/07/2020 10:06 AM

## 2020-04-07 NOTE — Progress Notes (Signed)
Pt discharged to home with wife. Discharge instructions and medication education provided to pt and wife. All questions and concerns answered about new prescription of Xarelto.

## 2020-04-07 NOTE — Discharge Summary (Signed)
Physician Discharge Summary  Nathan Fernandez [id number] DOB: 01/04/1941 DOA: 04/04/2020  PCP: Tonia Ghent, MD  Admit date: 04/04/2020 Discharge date: 04/07/2020  Admitted From: Inpatient Disposition: home  Recommendations for Outpatient Follow-up:  1. 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  79 year old male who presented with left upper quadrant abdominal pain.  He does have significant past medical history for coronary artery disease, COPD, type 2 diabetes mellitus, hypertension and dyslipidemia.  He reported a sudden abdominal pain around 3 AM that woke him up from his sleep, the pain was constant and sharp in nature, no associated nausea or vomiting.  Initially he was evaluated at [facility], and diagnosed with splenic and renal infarction, he was placed on a heparin drip and transferred to [place].  At his arrival his initial physical examination showed a blood pressure 144/96, heart rate 88, temperature 97.8, respiratory rate 20 and oxygen saturation 93%, his lungs are clear to auscultation bilaterally, heart S1-S2 present rhythmic, his abdomen was soft, mild tender in left upper quadrant, no rebound or guarding, no lower extremity edema.  Sodium 135, potassium 4.2, chloride 95, bicarb 30, glucose 195, BUN 10, creatinine 0.81, white count 11.4, hemoglobin 17.3, hematocrit 50.1, platelets 234.  SARS COVID-19 negative.  Urinalysis specific gravity >1.030.  CT of the abdomen with wedge-shaped splenic hypodensity,and wedge-shaped hypodensity left kidney, suggesting embolic source.  Chest radiograph with no infiltrates.  EKG 84 bpm, normal axis, first-degree AV  block, right bundle branch block, sinus rhythm, no ST segment or T wave changes.   Patient has been placed on IV heparin with good toleration on admission  Hospital course: Left kidney infarct and acute splenic infarct.  Patient was initially placed on heparin, CTA of chest was obtained which was negative for PE, echo showed no thrombus although reduced LV systolic function at 35.  Oncology recommended anticoagulation for 6 months.  CAD with systolic heart failure there were no acute signs of decompensation.  Patient is on carvedilol and Imdur.  Patient is allergic to ACE inhibitors, we will hold off on adding an ACE or an ARB and defer to his primary care physician for further evaluation.  Hypertension continue medications as above.  Type 2 diabetes and hyperlipidemia continue glucose control with home medications patient is on statin therapy and gemfibrozil which she will continue.  Continued outpatient management with PCP  Osteoarthritis because patient is now can be on a blood thinner I discontinued ibuprofen  Acute kidney injury improved with hydration.  Will discontinue ibuprofen as above will need close follow-up with PCP.     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[facility])    Discharge Instructions  Discharge Instructions    Call MD for:   Complete by: As directed    Any acute change in medical condition   Call MD for:  difficulty breathing, headache or visual disturbances   Complete by: As directed    Call MD for:  extreme fatigue   Complete by: As directed  Call MD for:  hives   Complete by: As directed    Call MD for:  persistant dizziness or light-headedness   Complete by: As directed    Call MD for:  persistant nausea and vomiting    Complete by: As directed    Call MD for:  severe uncontrolled pain   Complete by: As directed    Call MD for:  temperature >100.4   Complete by: As directed    Diet - low sodium heart healthy   Complete by: As directed    Discharge instructions   Complete by: As directed    Start Xarelto starter pack as prescribed, will need close follow-up with PCP for continuation of anticoagulation for at least 6 months   Increase activity slowly   Complete by: As directed      Allergies as of 04/07/2020      Reactions   Ace Inhibitors Swelling   Swelling of the tongue   Angiotensin Receptor Blockers Swelling   Tongue swelling   Enalapril Swelling   Tongue swelling      Medication List    TAKE these medications   albuterol 108 (90 Base) MCG/ACT inhaler Commonly known as: VENTOLIN HFA Inhale 1-2 puffs into the lungs every 6 (six) hours as needed for shortness of breath.   aspirin 81 MG EC tablet Take 81 mg by mouth daily.   buPROPion 150 MG 12 hr tablet Commonly known as: WELLBUTRIN SR Take 1 tablet (150 mg total) by mouth in the morning.   carvedilol 6.25 MG tablet Commonly known as: COREG TAKE 1 TABLET BY MOUTH TWICE A DAY What changed: when to take this   cyclobenzaprine 5 MG tablet Commonly known as: FLEXERIL Take 1 tablet (5 mg total) by mouth 3 (three) times daily as needed for muscle spasms.   dorzolamide-timolol 22.3-6.8 MG/ML ophthalmic solution Commonly known as: COSOPT Place 1 drop into both eyes 2 (two) times daily.   Fish Oil 1000 MG Caps Take 1,000 mg by mouth daily.   gemfibrozil 600 MG tablet Commonly known as: LOPID TAKE 1 TABLET BY MOUTH TWICE A DAY What changed: when to take this   ibuprofen 200 MG tablet Commonly known as: ADVIL Take 400 mg by mouth every 6 (six) hours as needed (for pain).   indomethacin 50 MG capsule Commonly known as: INDOCIN Take 1 capsule (50 mg total) by mouth 2 (two) times daily as needed (for gout/take with food).    isosorbide mononitrate 30 MG 24 hr tablet Commonly known as: IMDUR TAKE 1/2 TABLET BY MOUTH DAILY.   ketoconazole 2 % cream Commonly known as: NIZORAL Apply 1 application topically 2 (two) times daily as needed for irritation.   meclizine 12.5 MG tablet Commonly known as: ANTIVERT Take 1-2 tablets (12.5-25 mg total) by mouth 3 (three) times daily as needed for dizziness.   Myrbetriq 50 MG Tb24 tablet Generic drug: mirabegron ER Take 50 mg by mouth daily.   nitroGLYCERIN 0.4 MG SL tablet Commonly known as: NITROSTAT Place 1 tablet (0.4 mg total) under the tongue every 5 (five) minutes as needed for chest pain. Up to 3 doses   pravastatin 40 MG tablet Commonly known as: PRAVACHOL TAKE 1 TABLET BY MOUTH EVERYDAY AT BEDTIME What changed: See the new instructions.   Rivaroxaban Stater Pack (15 mg and 20 mg) Commonly known as: XARELTO STARTER PACK Follow package directions: Take one 15mg  tablet by mouth twice a day. On day 22, switch to one 20mg  tablet once  a day. Take with food.   Systane 0.4-0.3 % Gel ophthalmic gel Generic drug: Polyethyl Glycol-Propyl Glycol Place 1 application into the left eye at bedtime.   tamsulosin 0.4 MG Caps capsule Commonly known as: FLOMAX Take 2 capsules (0.8 mg total) by mouth daily.   traZODone 100 MG tablet Commonly known as: DESYREL TAKE 2 TABLETS (200 MG TOTAL) BY MOUTH AT BEDTIME.   vitamin E 1000 UNIT capsule Take 1,000 Units by mouth daily.       Allergies  Allergen Reactions  . Ace Inhibitors Swelling    Swelling of the tongue  . Angiotensin Receptor Blockers Swelling    Tongue swelling  . Enalapril Swelling    Tongue swelling    Consultations:  Per records it appears there was a curbside consultation with hematology oncology.   Procedures/Studies: DG Chest 2 View  Result Date: 04/04/2020 CLINICAL DATA:  79 year old male with pleuritic pain. EXAM: CHEST - 2 VIEW COMPARISON:  Chest radiographs 04/02/2020 and earlier.  FINDINGS: PA and lateral views. Centrilobular emphysema demonstrated on a 2014 CT. Stable lung volumes and mediastinal contours. Visualized tracheal air column is within normal limits. No pneumothorax, pulmonary edema, pleural effusion or acute pulmonary opacity. No acute osseous abnormality identified. Negative visible bowel gas pattern. IMPRESSION: Emphysema (ICD10-J43.9). No acute cardiopulmonary abnormality. Electronically Signed   By: Genevie Ann M.D.   On: 04/04/2020 19:09   DG Chest 2 View  Result Date: 04/02/2020 CLINICAL DATA:  Chronic obstructive pulmonary disease. EXAM: CHEST - 2 VIEW COMPARISON:  July 07, 2019. FINDINGS: The heart size and mediastinal contours are within normal limits. Both lungs are clear. No pneumothorax or pleural effusion is noted. The visualized skeletal structures are unremarkable. IMPRESSION: No active cardiopulmonary disease. Electronically Signed   By: Marijo Conception M.D.   On: 04/02/2020 15:21   CT ANGIO CHEST PE W OR WO CONTRAST  Result Date: 04/06/2020 CLINICAL DATA:  Shortness of breath, renal and splenic infarcts identified on prior CT of the abdomen and pelvis EXAM: CT ANGIOGRAPHY CHEST WITH CONTRAST TECHNIQUE: Multidetector CT imaging of the chest was performed using the standard protocol during bolus administration of intravenous contrast. Multiplanar CT image reconstructions and MIPs were obtained to evaluate the vascular anatomy. CONTRAST:  12mL OMNIPAQUE IOHEXOL 350 MG/ML SOLN COMPARISON:  CT abdomen pelvis, 04/04/2020, CT chest, 04/04/2013 FINDINGS: Cardiovascular: Satisfactory opacification of the pulmonary arteries to the segmental level. No evidence of pulmonary embolism. Mild cardiomegaly. Three-vessel coronary artery calcifications and/or stents. No pericardial effusion. Aortic atherosclerosis. Mediastinum/Nodes: Numerous prominent mediastinal lymph nodes, unchanged compared to CT dated 04/04/2013. Thyroid gland, trachea, and esophagus demonstrate no  significant findings. Lungs/Pleura: Moderate to severe centrilobular emphysema. No pleural effusion or pneumothorax. Upper Abdomen: No acute abnormality. Musculoskeletal: No chest wall abnormality. No acute or significant osseous findings. Review of the MIP images confirms the above findings. IMPRESSION: 1.  Negative examination for pulmonary embolism. 2.  Coronary artery disease.  Aortic Atherosclerosis (ICD10-I70.0). 3.  Emphysema (ICD10-J43.9). 4. Numerous prominent mediastinal lymph nodes, unchanged compared to prior CT dated 2014. Electronically Signed   By: Eddie Candle M.D.   On: 04/06/2020 19:16   CT ABDOMEN PELVIS W CONTRAST  Result Date: 04/04/2020 CLINICAL DATA:  Left-sided abdominal pain, nausea since 3 a.m. EXAM: CT ABDOMEN AND PELVIS WITH CONTRAST TECHNIQUE: Multidetector CT imaging of the abdomen and pelvis was performed using the standard protocol following bolus administration of intravenous contrast. CONTRAST:  170mL OMNIPAQUE IOHEXOL 300 MG/ML  SOLN COMPARISON:  None. FINDINGS: Lower  chest: No acute pleural or parenchymal lung disease. Hepatobiliary: No focal liver abnormality is seen. No gallstones, gallbladder wall thickening, or biliary dilatation. Pancreas: Unremarkable. No pancreatic ductal dilatation or surrounding inflammatory changes. Spleen: Wedge-shaped area of decreased attenuation within the spleen measures 5.3 x 7.3 cm, consistent with infarct. There is scattered atherosclerosis within the splenic artery but I do not see any focal occlusion. Adrenals/Urinary Tract: There is abnormal wedge-shaped decreased attenuation within the superolateral aspect left kidney, measuring 3.3 x 4.0 cm. Differential include infarct or focal pyelonephritis, though given the associated findings within the spleen infarct is suspected. The right kidney enhances normally. No urinary tract calculi or obstructive uropathy. Bladder is unremarkable. The adrenals are normal. Stomach/Bowel: No bowel  obstruction or ileus. No bowel wall thickening or inflammatory change. Vascular/Lymphatic: Moderate atherosclerosis throughout the aorta and its branches. No aneurysm or dissection. No pathologic adenopathy. Reproductive: There is heterogeneous enlargement of the prostate, measuring 5.6 x 6.0 cm. Other: No free fluid or free gas. Musculoskeletal: No acute or destructive bony lesions. Multilevel lumbar spondylosis. Reconstructed images demonstrate no additional findings. IMPRESSION: 1. Wedge-shaped splenic hypodensity, most compatible with infarct. 2. Wedge-shaped hypodensity superolateral left kidney. Wall focal pyelonephritis could give this appearance, infarct is more likely given the associated findings within the spleen. Multi-vessel infarct distribution may suggest an embolic source. 3. Enlarged prostate. 4. Atherosclerosis. Electronically Signed   By: Randa Ngo M.D.   On: 04/04/2020 19:12   ECHOCARDIOGRAM COMPLETE  Result Date: 04/05/2020    ECHOCARDIOGRAM REPORT   Patient Name:   Nathan Fernandez Date of Exam: 04/05/2020 Medical Rec #:  [id number]     Height:       69.0 in Accession #:    [id number]    Weight:       173.7 lb Date of Birth:  09-Aug-1941     BSA:          1.946 m Patient Age:    23 years      BP:           144/96 mmHg Patient Gender: M             HR:           89 bpm. Exam Location:  Inpatient Procedure: 2D Echo, Cardiac Doppler and Color Doppler Indications:    Atrial fibrillation  History:        Patient has no prior history of Echocardiogram examinations. CAD                 and Previous Myocardial Infarction, COPD; Risk Factors:Diabetes,                 Hypertension and Dyslipidemia. Spleenic infarct.  Sonographer:    Dustin Flock Referring Phys: [id number] MIR MOHAMMED [place]  1. Left ventricular ejection fraction, by estimation, is 35 to 40%. The left ventricle has moderately decreased function. The left ventricle demonstrates global hypokinesis. Left ventricular  diastolic parameters are consistent with Grade I diastolic dysfunction (impaired relaxation).  2. Right ventricular systolic function is normal. The right ventricular size is normal.  3. The mitral valve is normal in structure. No evidence of mitral valve regurgitation. No evidence of mitral stenosis.  4. The aortic valve is normal in structure. Aortic valve regurgitation is not visualized. No aortic stenosis is present.  5. The inferior vena cava is normal in size with greater than 50% respiratory variability, suggesting right atrial pressure of 3 mmHg. FINDINGS  Left Ventricle: Left ventricular ejection  fraction, by estimation, is 35 to 40%. The left ventricle has moderately decreased function. The left ventricle demonstrates global hypokinesis. The left ventricular internal cavity size was normal in size. There is no left ventricular hypertrophy. Left ventricular diastolic parameters are consistent with Grade I diastolic dysfunction (impaired relaxation). Normal left ventricular filling pressure. Right Ventricle: The right ventricular size is normal. No increase in right ventricular wall thickness. Right ventricular systolic function is normal. Left Atrium: Left atrial size was normal in size. Right Atrium: Right atrial size was normal in size. Pericardium: There is no evidence of pericardial effusion. Mitral Valve: The mitral valve is normal in structure. Normal mobility of the mitral valve leaflets. No evidence of mitral valve regurgitation. No evidence of mitral valve stenosis. Tricuspid Valve: The tricuspid valve is normal in structure. Tricuspid valve regurgitation is not demonstrated. No evidence of tricuspid stenosis. Aortic Valve: The aortic valve is normal in structure. Aortic valve regurgitation is not visualized. No aortic stenosis is present. Pulmonic Valve: The pulmonic valve was normal in structure. Pulmonic valve regurgitation is not visualized. No evidence of pulmonic stenosis. Aorta: The aortic  root is normal in size and structure. Venous: The inferior vena cava is normal in size with greater than 50% respiratory variability, suggesting right atrial pressure of 3 mmHg. IAS/Shunts: No atrial level shunt detected by color flow Doppler.  LEFT VENTRICLE PLAX 2D LVIDd:         5.00 cm  Diastology LVIDs:         3.80 cm  LV e' lateral:   7.51 cm/s LV PW:         1.20 cm  LV E/e' lateral: 6.6 LV IVS:        0.90 cm  LV e' medial:    5.44 cm/s LVOT diam:     2.30 cm  LV E/e' medial:  9.1 LV SV:         73 LV SV Index:   38 LVOT Area:     4.15 cm  RIGHT VENTRICLE RV Basal diam:  3.00 cm RV S prime:     10.00 cm/s TAPSE (M-mode): 2.8 cm LEFT ATRIUM             Index       RIGHT ATRIUM           Index LA diam:        3.70 cm 1.90 cm/m  RA Area:     17.80 cm LA Vol (A2C):   45.6 ml 23.43 ml/m RA Volume:   51.80 ml  26.62 ml/m LA Vol (A4C):   63.0 ml 32.38 ml/m LA Biplane Vol: 58.0 ml 29.81 ml/m  AORTIC VALVE LVOT Vmax:   88.80 cm/s LVOT Vmean:  64.400 cm/s LVOT VTI:    0.176 m  AORTA Ao Root diam: 3.60 cm MITRAL VALVE MV Area (PHT): 7.02 cm    SHUNTS MV Decel Time: 108 msec    Systemic VTI:  0.18 m MV E velocity: 49.70 cm/s  Systemic Diam: 2.30 cm MV A velocity: 91.30 cm/s MV E/A ratio:  0.54 Ena Dawley MD Electronically signed by Ena Dawley MD Signature Date/Time: 04/05/2020/10:53:04 PM    Final    VAS [place] LOWER EXTREMITY VENOUS (DVT)  Result Date: 04/06/2020  Lower Venous DVTStudy Indications: Edema.  Risk Factors: None identified. Limitations: Prior L knee injury. Comparison Study: No prior studies. Performing Technologist: Oliver Hum RVT  Examination Guidelines: A complete evaluation includes B-mode imaging, spectral Doppler, color Doppler, and power  Doppler as needed of all accessible portions of each vessel. Bilateral testing is considered an integral part of a complete examination. Limited examinations for reoccurring indications may be performed as noted. The reflux portion of the exam  is performed with the patient in reverse Trendelenburg.  +---------+---------------+---------+-----------+----------+--------------+ RIGHT    CompressibilityPhasicitySpontaneityPropertiesThrombus Aging +---------+---------------+---------+-----------+----------+--------------+ CFV      Full           Yes      Yes                                 +---------+---------------+---------+-----------+----------+--------------+ SFJ      Full                                                        +---------+---------------+---------+-----------+----------+--------------+ FV Prox  Full                                                        +---------+---------------+---------+-----------+----------+--------------+ FV Mid   Full                                                        +---------+---------------+---------+-----------+----------+--------------+ FV DistalFull                                                        +---------+---------------+---------+-----------+----------+--------------+ PFV      Full                                                        +---------+---------------+---------+-----------+----------+--------------+ POP      Full           Yes      Yes                                 +---------+---------------+---------+-----------+----------+--------------+ PTV      Full                                                        +---------+---------------+---------+-----------+----------+--------------+ PERO     Full                                                        +---------+---------------+---------+-----------+----------+--------------+   +---------+---------------+---------+-----------+----------+--------------+  LEFT     CompressibilityPhasicitySpontaneityPropertiesThrombus Aging +---------+---------------+---------+-----------+----------+--------------+ CFV      Full           Yes      Yes                                  +---------+---------------+---------+-----------+----------+--------------+ SFJ      Full                                                        +---------+---------------+---------+-----------+----------+--------------+ FV Prox  Full                                                        +---------+---------------+---------+-----------+----------+--------------+ FV Mid   Full                                                        +---------+---------------+---------+-----------+----------+--------------+ FV DistalFull                                                        +---------+---------------+---------+-----------+----------+--------------+ PFV      Full                                                        +---------+---------------+---------+-----------+----------+--------------+ POP      Full           Yes      Yes                                 +---------+---------------+---------+-----------+----------+--------------+ PTV      Full                                                        +---------+---------------+---------+-----------+----------+--------------+ PERO     Full                                                        +---------+---------------+---------+-----------+----------+--------------+ 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 Per patient, image quality is suboptimal in the popliteal space likely due to a previous injury.    Summary: RIGHT: - There is no evidence of deep vein thrombosis in the lower extremity.  - No cystic structure found  in the popliteal fossa.  LEFT: - There is no evidence of deep vein thrombosis in the lower extremity.  - No cystic structure found in the popliteal fossa. 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 Per patient, image quality is suboptimal in the popliteal space likely due to a previous injury.  *See table(s) above for measurements and  observations.    Preliminary        Subjective: Patient reports feeling well, at his baseline, ready to be discharged home in his words  Discharge Exam: Vitals:   04/06/20 2125 04/07/20 0456  BP: 140/85 (!) 142/87  Pulse: 84 77  Resp: 20 20  Temp: 98.8 F (37.1 C) 98.4 F (36.9 C)  SpO2: 95% 94%   Vitals:   04/06/20 0547 04/06/20 1321 04/06/20 2125 04/07/20 0456  BP: 137/83 103/72 140/85 (!) 142/87  Pulse: 88 79 84 77  Resp: 20 19 20 20   Temp: 99.4 F (37.4 C) 98 F (36.7 C) 98.8 F (37.1 C) 98.4 F (36.9 C)  TempSrc:   Oral Oral  SpO2: 93% 96% 95% 94%  Weight:      Height:        General: Pt is alert, awake, not in acute distress Cardiovascular: RRR, S1/S2 +, no rubs, no gallops Respiratory: CTA bilaterally, no wheezing, no rhonchi Abdominal: Soft, NT, ND, bowel sounds + Extremities: no edema, no cyanosis    The results of significant diagnostics from this hospitalization (including imaging, microbiology, ancillary and laboratory) are listed below for reference.     Microbiology: Recent Results (from the past 240 hour(s))  SARS Coronavirus 2 by RT PCR     Status: None   Collection Time: 04/04/20 10:43 PM  Result Value Ref Range Status   SARS Coronavirus 2 NEGATIVE NEGATIVE Final    Comment: (NOTE) Result indicates the ABSENCE of SARS-CoV-2 RNA in the patient specimen.  The lowest concentration of SARS-CoV-2 viral copies this assay can detect in nasopharyngeal swab specimens is 500 copies / mL.  A negative result does not preclude SARS-CoV-2 infection and should not be used as the sole basis for patient management decisions. A negative result may occur with improper specimen collection / handling, submission of a specimen other than nasopharyngeal swab, presence of viral mutation(s) within the areas targeted by this assay, and inadequate number of viral copies (<500 copies / mL) present.  Negative results must be combined with clinical observations,  patient history, and epidemiological information.  The expected result is NEGATIVE.  Patient Fact Sheet:  BlogSelections.co.uk   Provider Fact Sheet:  https://lucas.com/   This test is not yet approved or cleared by the [place] FDA and  has been authorized for  detection and/or diagnosis of SARS-CoV-2 by FDA under an Emergency Use Authorization (EUA).  This EUA will remain in effect (meaning this test can be used) for the duration of  the COVID-19 declaration under Section 564(b)(1) of the Act, 21 U.S.C. section 360bbb-3(b)(1), unless the authorization is terminated or revoked sooner Performed at [facility], [place] [address], [place], [place] [address]      Labs: BNP (last 3 results) No results for input(s): BNP in the last 8760 hours. Basic Metabolic Panel: Recent Labs  Lab 04/04/20 1748 04/06/20 0510 04/07/20 0837  NA 135 135 135  K 4.2 4.3 4.5  CL 95* 98 100  CO2 30 27 28   GLUCOSE 195* 184* 178*  BUN 10 24* 19  CREATININE 0.81 1.41* 0.97  CALCIUM 10.1 8.9  8.4*   Liver Function Tests: Recent Labs  Lab 04/04/20 1748  AST 18  ALT 15  ALKPHOS 57  BILITOT 0.7  PROT 7.8  ALBUMIN 4.0   Recent Labs  Lab 04/04/20 1748  LIPASE 25   No results for input(s): AMMONIA in the last 168 hours. CBC: Recent Labs  Lab 04/04/20 1748 04/05/20 0519 04/06/20 0510 04/07/20 0837  WBC 11.4* 10.8* 10.9* 9.4  NEUTROABS 7.9*  --   --   --   HGB 17.3* 15.7 14.8 15.1  HCT 50.1 46.7 44.1 43.7  MCV 89.3 90.0 90.2 89.4  PLT 234 199 173 192   Cardiac Enzymes: No results for input(s): CKTOTAL, CKMB, CKMBINDEX, TROPONINI in the last 168 hours. BNP: Invalid input(s): POCBNP CBG: Recent Labs  Lab 04/06/20 1202 04/06/20 1642 04/06/20 2128 04/07/20 0737 04/07/20 1131  GLUCAP 157* 177* 237* 171* 166*   D-Dimer No results for input(s): DDIMER in the last 72 hours. Hgb A1c Recent Labs    04/05/20 1359  HGBA1C 8.3*    Lipid Profile No results for input(s): CHOL, HDL, LDLCALC, TRIG, CHOLHDL, LDLDIRECT in the last 72 hours. Thyroid function studies No results for input(s): TSH, T4TOTAL, T3FREE, THYROIDAB in the last 72 hours.  Invalid input(s): FREET3 Anemia work up No results for input(s): VITAMINB12, FOLATE, FERRITIN, TIBC, IRON, RETICCTPCT in the last 72 hours. Urinalysis    Component Value Date/Time   COLORURINE YELLOW 04/04/2020 [place] 04/04/2020 1748   LABSPEC >1.030 (H) 04/04/2020 1748   PHURINE 6.0 04/04/2020 1748   GLUCOSEU 100 (A) 04/04/2020 1748   HGBUR TRACE (A) 04/04/2020 1748   HGBUR trace-lysed 08/17/2009 1039   [place] 04/04/2020 1748   BILIRUBINUR negative 07/14/2018 1001   KETONESUR NEGATIVE 04/04/2020 1748   PROTEINUR >300 (A) 04/04/2020 1748   UROBILINOGEN 0.2 07/14/2018 1001   UROBILINOGEN 0.2 08/17/2009 1039   NITRITE NEGATIVE 04/04/2020 1748   LEUKOCYTESUR NEGATIVE 04/04/2020 1748   Sepsis Labs Invalid input(s): PROCALCITONIN,  WBC,  LACTICIDVEN Microbiology Recent Results (from the past 240 hour(s))  SARS Coronavirus 2 by RT PCR     Status: None   Collection Time: 04/04/20 10:43 PM  Result Value Ref Range Status   SARS Coronavirus 2 NEGATIVE NEGATIVE Final    Comment: (NOTE) Result indicates the ABSENCE of SARS-CoV-2 RNA in the patient specimen.  The lowest concentration of SARS-CoV-2 viral copies this assay can detect in nasopharyngeal swab specimens is 500 copies / mL.  A negative result does not preclude SARS-CoV-2 infection and should not be used as the sole basis for patient management decisions. A negative result may occur with improper specimen collection / handling, submission of a specimen other than nasopharyngeal swab, presence of viral mutation(s) within the areas targeted by this assay, and inadequate number of viral copies (<500 copies / mL) present.  Negative results must be combined with clinical observations,  patient history, and epidemiological information.  The expected result is NEGATIVE.  Patient Fact Sheet:  BlogSelections.co.uk   Provider Fact Sheet:  https://lucas.com/   This test is not yet approved or cleared by the [place] FDA and  has been authorized for  detection and/or diagnosis of SARS-CoV-2 by FDA under an Emergency Use Authorization (EUA).  This EUA will remain in effect (meaning this test can be used) for the duration of  the COVID-19 declaration under Section 564(b)(1) of the Act, 21 U.S.C. section 360bbb-3(b)(1), unless the authorization is terminated or revoked sooner Performed at [facility], [place] [address] [place], [place] [address]      Time coordinating discharge: Over 30 minutes  SIGNED:   Nicolette Bang, MD  [facility] 04/07/2020, 12:01 PM Pager   If 7PM-7AM, please contact night-coverage [email or web address] Password [email or web address]

## 2020-04-07 NOTE — Progress Notes (Signed)
ANTICOAGULATION CONSULT NOTE - Follow Up Consult  Pharmacy Consult for Heparin Indication: splenic infarct  Allergies  Allergen Reactions  . Ace Inhibitors Swelling    Swelling of the tongue  . Angiotensin Receptor Blockers Swelling    Tongue swelling  . Enalapril Swelling    Tongue swelling    Patient Measurements: Height: 5\' 9"  (175.3 cm) Weight: 78.8 kg (173 lb 11.2 oz) IBW/kg (Calculated) : 70.7 Heparin Dosing Weight: TBW  Vital Signs: Temp: 98.8 F (37.1 C) (04/16 2125) Temp Source: Oral (04/16 2125) BP: 140/85 (04/16 2125) Pulse Rate: 84 (04/16 2125)  Labs: Recent Labs    04/04/20 1748 04/04/20 1748 04/05/20 0519 04/05/20 1942 04/06/20 0510 04/06/20 1330 04/06/20 2236  HGB 17.3*   < > 15.7  --  14.8  --   --   HCT 50.1  --  46.7  --  44.1  --   --   PLT 234  --  199  --  173  --   --   HEPARINUNFRC  --   --  1.14*   < > 0.40 0.30 0.24*  CREATININE 0.81  --   --   --  1.41*  --   --   TROPONINIHS 9  --   --   --   --   --   --    < > = values in this interval not displayed.    Estimated Creatinine Clearance: 42.5 mL/min (A) (by C-G formula based on SCr of 1.41 mg/dL (H)).   Medications:  Infusions:  . sodium chloride 50 mL/hr at 04/06/20 1854  . heparin      Assessment: 81 YOM presenting with abdominal pain and nausea, infarct on CT.  Not on anticoagulation PTA.  Pharmacy is consulted to dose Heparin IV.  04/07/2020: - Heparin level at low end of goal range (0.3) on heparin 1200 units/hr - CBC: WNL - No bleeding or infusion related issues noted 2236 HL = 0.24 below goal, no bleeding or infusion issues per RN.  Goal of Therapy:  Heparin level 0.3-0.7 units/ml Monitor platelets by anticoagulation protocol: Yes   Plan:  Increase heparin IV infusion to 1400 units/hr Recheck heparin level 8 hours  Daily heparin level and CBC Continue to monitor H&H and platelets  Dorrene German 04/07/2020 12:21 AM

## 2020-04-07 NOTE — Discharge Instructions (Signed)
Information on my medicine - XARELTO (rivaroxaban)  WHY WAS XARELTO PRESCRIBED FOR YOU? Xarelto was prescribed to treat blood clots that were found in your spleen and kidney and to reduce the risk of them occurring again.  What do you need to know about Xarelto? The starting dose is one 15 mg tablet taken TWICE daily with food for the FIRST 21 DAYS then on 04/28/2020, the dose is changed to one 20 mg tablet taken ONCE A DAY with your evening meal.  DO NOT stop taking Xarelto without talking to the health care provider who prescribed the medication.  Refill your prescription for 20 mg tablets before you run out.  After discharge, you should have regular check-up appointments with your healthcare provider that is prescribing your Xarelto.  In the future your dose may need to be changed if your kidney function changes by a significant amount.  What do you do if you miss a dose? If you are taking Xarelto TWICE DAILY and you miss a dose, take it as soon as you remember. You may take two 15 mg tablets (total 30 mg) at the same time then resume your regularly scheduled 15 mg twice daily the next day.  If you are taking Xarelto ONCE DAILY and you miss a dose, take it as soon as you remember on the same day then continue your regularly scheduled once daily regimen the next day. Do not take two doses of Xarelto at the same time.   Important Safety Information Xarelto is a blood thinner medicine that can cause bleeding. You should call your healthcare provider right away if you experience any of the following: ? Bleeding from an injury or your nose that does not stop. ? Unusual colored urine (red or dark brown) or unusual colored stools (red or black). ? Unusual bruising for unknown reasons. ? A serious fall or if you hit your head (even if there is no bleeding).  Some medicines may interact with Xarelto and might increase your risk of bleeding while on Xarelto. To help avoid this, consult  your healthcare provider or pharmacist prior to using any new prescription or non-prescription medications, including herbals, vitamins, non-steroidal anti-inflammatory drugs (NSAIDs) and supplements.  This website has more information on Xarelto: [email or web address]-[email or web address]

## 2020-04-09 ENCOUNTER — Telehealth: Payer: Self-pay

## 2020-04-09 NOTE — Telephone Encounter (Signed)
Noted. Thanks.

## 2020-04-09 NOTE — Telephone Encounter (Signed)
1st attempt- Left HIPAA complaint voicemail for patient to return my call- need to complete TCM and schedule follow up visit.

## 2020-04-09 NOTE — Telephone Encounter (Signed)
2nd and final attempt- Called patient to complete TCM. Mailbox is now full and cannot leave any messages. Patient has hospital follow up scheduled for 04/12/2020.

## 2020-04-12 ENCOUNTER — Encounter: Payer: Self-pay | Admitting: Family Medicine

## 2020-04-12 ENCOUNTER — Ambulatory Visit (INDEPENDENT_AMBULATORY_CARE_PROVIDER_SITE_OTHER): Payer: Medicare Other | Admitting: Family Medicine

## 2020-04-12 ENCOUNTER — Other Ambulatory Visit: Payer: Self-pay

## 2020-04-12 VITALS — BP 142/84 | HR 79 | Temp 97.3°F | Ht 69.0 in | Wt 170.6 lb

## 2020-04-12 DIAGNOSIS — I748 Embolism and thrombosis of other arteries: Secondary | ICD-10-CM

## 2020-04-12 DIAGNOSIS — D735 Infarction of spleen: Secondary | ICD-10-CM

## 2020-04-12 NOTE — Progress Notes (Signed)
This visit occurred during the SARS-CoV-2 public health emergency.  Safety protocols were in place, including screening questions prior to the visit, additional usage of staff PPE, and extensive cleaning of exam room while observing appropriate contact time as indicated for disinfecting solutions.  Inpatient f/u.  After last office visit here he had new onset left-sided abdominal pain that was significant.  He went to emergency room and had new splenic and renal infarcts diagnosed.  Was transferred to hospital and anticoagulated.  Inpatient course discussed with patient.  He clearly feels better in the meantime.  This was his first thrombotic event. No mass or tumor seen on CT CAP.  No DVT on u/s. No hematuria, no blood in stool.  No FCNAVD.  abd pain resolved.    Inpatient echo discussed with patient.  H/o EF 40%, not a new finding.    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  No burning with urination.  Compliant with anticoagulation in the meantime without bleeding.  He has had a bulge on the left side of the abdomen intermittently for years.  This is not a new issue.  Not tender.  ROS: Per HPI unless specifically indicated in ROS section   Meds, vitals, and allergies reviewed.   GEN: nad, alert and oriented HEENT: ncat NECK: supple w/o LA CV: rrr. PULM: ctab, no inc wob ABD: soft, +bs, he has a soft bulge on the left side of the abdomen. EXT: no edema SKIN: no acute rash

## 2020-04-12 NOTE — Patient Instructions (Addendum)
Don't change your meds for now.  We'll call about seeing the blood clinic.  Take care.  Glad to see you. Let me know if you need a refill on the blood thinner in the meantime.  The blood clinic may address that.

## 2020-04-13 ENCOUNTER — Telehealth: Payer: Self-pay | Admitting: Hematology

## 2020-04-13 NOTE — Telephone Encounter (Signed)
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 [name] has been cld and scheduled to see Dr. Irene Limbo on 4/26 at 11am. Pt aware to arrive 15 minutes early.

## 2020-04-16 ENCOUNTER — Other Ambulatory Visit: Payer: Self-pay

## 2020-04-16 ENCOUNTER — Inpatient Hospital Stay: Payer: Medicare Other | Attending: Hematology | Admitting: Hematology

## 2020-04-16 ENCOUNTER — Telehealth: Payer: Self-pay | Admitting: Hematology

## 2020-04-16 ENCOUNTER — Inpatient Hospital Stay: Payer: Medicare Other

## 2020-04-16 VITALS — BP 140/86 | HR 79 | Temp 98.5°F | Resp 20 | Ht 69.0 in | Wt 170.2 lb

## 2020-04-16 DIAGNOSIS — N400 Enlarged prostate without lower urinary tract symptoms: Secondary | ICD-10-CM | POA: Insufficient documentation

## 2020-04-16 DIAGNOSIS — N280 Ischemia and infarction of kidney: Secondary | ICD-10-CM | POA: Diagnosis not present

## 2020-04-16 DIAGNOSIS — Z87891 Personal history of nicotine dependence: Secondary | ICD-10-CM | POA: Insufficient documentation

## 2020-04-16 DIAGNOSIS — E119 Type 2 diabetes mellitus without complications: Secondary | ICD-10-CM | POA: Diagnosis not present

## 2020-04-16 DIAGNOSIS — D735 Infarction of spleen: Secondary | ICD-10-CM

## 2020-04-16 DIAGNOSIS — R590 Localized enlarged lymph nodes: Secondary | ICD-10-CM | POA: Diagnosis not present

## 2020-04-16 DIAGNOSIS — E785 Hyperlipidemia, unspecified: Secondary | ICD-10-CM | POA: Diagnosis not present

## 2020-04-16 DIAGNOSIS — I748 Embolism and thrombosis of other arteries: Secondary | ICD-10-CM | POA: Insufficient documentation

## 2020-04-16 DIAGNOSIS — R109 Unspecified abdominal pain: Secondary | ICD-10-CM | POA: Diagnosis not present

## 2020-04-16 DIAGNOSIS — I252 Old myocardial infarction: Secondary | ICD-10-CM | POA: Insufficient documentation

## 2020-04-16 DIAGNOSIS — I2510 Atherosclerotic heart disease of native coronary artery without angina pectoris: Secondary | ICD-10-CM | POA: Diagnosis not present

## 2020-04-16 DIAGNOSIS — D6859 Other primary thrombophilia: Secondary | ICD-10-CM

## 2020-04-16 DIAGNOSIS — R110 Nausea: Secondary | ICD-10-CM | POA: Insufficient documentation

## 2020-04-16 DIAGNOSIS — Z823 Family history of stroke: Secondary | ICD-10-CM | POA: Insufficient documentation

## 2020-04-16 DIAGNOSIS — I700 Atherosclerosis of aorta: Secondary | ICD-10-CM | POA: Diagnosis not present

## 2020-04-16 DIAGNOSIS — J449 Chronic obstructive pulmonary disease, unspecified: Secondary | ICD-10-CM | POA: Insufficient documentation

## 2020-04-16 DIAGNOSIS — Z7982 Long term (current) use of aspirin: Secondary | ICD-10-CM | POA: Diagnosis not present

## 2020-04-16 DIAGNOSIS — Z8249 Family history of ischemic heart disease and other diseases of the circulatory system: Secondary | ICD-10-CM | POA: Insufficient documentation

## 2020-04-16 DIAGNOSIS — Z79899 Other long term (current) drug therapy: Secondary | ICD-10-CM | POA: Diagnosis not present

## 2020-04-16 DIAGNOSIS — I10 Essential (primary) hypertension: Secondary | ICD-10-CM | POA: Insufficient documentation

## 2020-04-16 DIAGNOSIS — Z7901 Long term (current) use of anticoagulants: Secondary | ICD-10-CM | POA: Insufficient documentation

## 2020-04-16 LAB — CBC WITH DIFFERENTIAL/PLATELET
Abs Immature Granulocytes: 0.04 10*3/uL (ref 0.00–0.07)
Basophils Absolute: 0.1 10*3/uL (ref 0.0–0.1)
Basophils Relative: 1 %
Eosinophils Absolute: 0.3 10*3/uL (ref 0.0–0.5)
Eosinophils Relative: 5 %
HCT: 47.4 % (ref 39.0–52.0)
Hemoglobin: 16 g/dL (ref 13.0–17.0)
Immature Granulocytes: 1 %
Lymphocytes Relative: 24 %
Lymphs Abs: 1.6 10*3/uL (ref 0.7–4.0)
MCH: 30 pg (ref 26.0–34.0)
MCHC: 33.8 g/dL (ref 30.0–36.0)
MCV: 88.8 fL (ref 80.0–100.0)
Monocytes Absolute: 0.6 10*3/uL (ref 0.1–1.0)
Monocytes Relative: 9 %
Neutro Abs: 4.1 10*3/uL (ref 1.7–7.7)
Neutrophils Relative %: 60 %
Platelets: 329 10*3/uL (ref 150–400)
RBC: 5.34 MIL/uL (ref 4.22–5.81)
RDW: 12.1 % (ref 11.5–15.5)
WBC: 6.7 10*3/uL (ref 4.0–10.5)
nRBC: 0 % (ref 0.0–0.2)

## 2020-04-16 LAB — CMP (CANCER CENTER ONLY)
ALT: 11 U/L (ref 0–44)
AST: 14 U/L — ABNORMAL LOW (ref 15–41)
Albumin: 3.2 g/dL — ABNORMAL LOW (ref 3.5–5.0)
Alkaline Phosphatase: 63 U/L (ref 38–126)
Anion gap: 8 (ref 5–15)
BUN: 14 mg/dL (ref 8–23)
CO2: 27 mmol/L (ref 22–32)
Calcium: 9.7 mg/dL (ref 8.9–10.3)
Chloride: 103 mmol/L (ref 98–111)
Creatinine: 0.95 mg/dL (ref 0.61–1.24)
GFR, Est AFR Am: >60 mL/min (ref >60)
GFR, Estimated: >60 mL/min (ref >60)
Glucose, Bld: 163 mg/dL — ABNORMAL HIGH (ref 70–99)
Potassium: 4.5 mmol/L (ref 3.5–5.1)
Sodium: 138 mmol/L (ref 135–145)
Total Bilirubin: 0.5 mg/dL (ref 0.3–1.2)
Total Protein: 7.3 g/dL (ref 6.5–8.1)

## 2020-04-16 NOTE — Telephone Encounter (Signed)
Scheduled per los. Gave avs and calendar  

## 2020-04-16 NOTE — Progress Notes (Signed)
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 male who has been referred to [place] by Dr. 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 Pt is accompanied today by his wife. The pt reports that he is doing well overall.   The pt reports has had no personal or family history of blood clots. Pt has been on a baby Aspirin for 20+ years for his CAD. He was having sharp left-sided abdominal pain prior to going to the ED on 04/04/2020. His abdominal pain began to subside after he checked into the hospital and resolved by 04/06/2020. Pt received his second dose of the Pfizer vaccine about one month prior to this incident.   Pt stopped smoking cigarettes 3 years ago and has COPD. He has never been placed on oxygen chronically. He has had some increased fatigue, which he attributes to age, and increased bruising over the the last year. Pt is currently experiencing a productive cough and has no history of seasonal allergies.   Of note prior to the patient's visit today, pt has had CT Abd/Pel ([id number]) completed on 04/04/2020 with results revealing "1. Wedge-shaped splenic hypodensity, most compatible with infarct. 2. Wedge-shaped hypodensity superolateral left kidney. Wall focal pyelonephritis could give this appearance, infarct is more likely given the associated findings within the spleen. Multi-vessel infarct distribution may suggest an embolic source. 3. Enlarged prostate. 4. Atherosclerosis."   Pt has had ECHO ([id number]) completed on 04/05/2020 with results revealing "1. Left ventricular ejection  fraction, by estimation, is 35 to 40%. The left ventricle has moderately decreased function. The left ventricle demonstrates global hypokinesis. Left ventricular diastolic parameters are consistent with Grade I diastolic dysfunction (impaired relaxation). 2. Right ventricular systolic function is normal. The right ventricular size is normal. 3. The mitral valve is normal in structure. No evidence of mitral valve regurgitation. No evidence of mitral stenosis. 4. The aortic valve is normal in structure. Aortic valve regurgitation is not visualized. No aortic stenosis is present.5. The inferior vena cava is normal in size with greater than 50% respiratory variability, suggesting right atrial pressure of 3 mmHg."  Most recent lab results (04/07/2020) of CBC and BMP is as follows: all values are WNL except for Glucose at 178, Calcium at 8.4.  On review of systems, pt reports productive cough, bruising, fatigue and denies SOB, unexpected weight loss, constipation, diarrhea, abdominal pain, testicular pain/swelling and any other symptoms.   On PMHx the pt reports CAD, COPD, Diabetes Type II, HTN, Dyslipidemia, Left Heart Cath and Coronary Angiography. On Social Hx the pt reports that he quit smoking 3 years ago.    MEDICAL HISTORY:  Past Medical History:  Diagnosis Date  . 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   Hosp R facial weakness  . CAD (coronary artery disease)    MI, [place], PTCA; [facility] 04/18/13: Distal left main 40%, proximal LAD with several aneurysmal segments, proximal LAD 50% prior to and after aneurysmal segments, area does not appear to flow-limiting, proximal diagonal 70%, ostial circumflex 20%, proximal circumflex 20%, OM1 30-40%, proximal RCA 40%, mid RCA 30%, distal RCA 20%, EF 50% => med Rx  .  COPD (chronic obstructive pulmonary disease) ([place])   . DM2 (diabetes mellitus, type 2) ([place])   . History of ETT 1998   wnl  . History of MRI 08/04/09   brain- atrophy sm vess dz  . HLD (hyperlipidemia)    . HTN (hypertension)   . OA (osteoarthritis)     SURGICAL HISTORY: Past Surgical History:  Procedure Laterality Date  . cataract surgery  09/06   repair lens which moved  . EYE SURGERY    . LEFT HEART CATH AND CORONARY ANGIOGRAPHY N/A 01/21/2019   Procedure: LEFT HEART CATH AND CORONARY ANGIOGRAPHY;  Surgeon: Burnell Blanks, MD;  Location: [facility];  Service: Cardiovascular;  Laterality: N/A;  . stress myoview  06/29/06   sm distal anteroseptal & apical infarct  . THYROIDECTOMY, PARTIAL  1967   B9 growth    SOCIAL HISTORY: Social History   Socioeconomic History  . Marital status: Married    Spouse name: Not on file  . Number of children: 4  . Years of education: Not on file  . Highest education level: Not on file  Occupational History  . 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 Occupation: retired  Tobacco Use  . Smoking status: Former Smoker    Types: Cigars  . Smokeless tobacco: Never Used  . Tobacco comment: cigar occassionally  Substance and Sexual Activity  . Alcohol use: Not Currently    Alcohol/week: 0.0 standard drinks  . Drug use: No  . Sexual activity: Not on file  Other Topics Concern  . 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 Difficulty of Paying Living Expenses: Not hard at all  Food Insecurity: No Food Insecurity  . Worried About Charity fundraiser in the Last Year: Never true  . Ran Out of Food in the Last Year: Never true  Transportation Needs: No Transportation Needs  . Lack of Transportation (Medical): No  . Lack of Transportation (Non-Medical): No  Physical Activity: Inactive  . Days of Exercise per Week:  0 days  . Minutes of Exercise per Session: 0 min  Stress: No Stress Concern Present  . Feeling of Stress : Not at all  Social Connections:   . Frequency of Communication with Friends and Family:   . Frequency of Social Gatherings with Friends and Family:   . Attends Religious Services:   . Active Member of Clubs or Organizations:   . Attends Archivist Meetings:   Marland Kitchen Marital Status:   Intimate Partner Violence: Not At Risk  . Fear of Current or Ex-Partner: No  . Emotionally Abused: No  . Physically Abused: No  . Sexually Abused: No    FAMILY HISTORY: Family History  Problem Relation Age of Onset  . Hypertension Mother   . Aneurysm Mother   . Stroke Mother   . Hypertension Father   . Heart disease Father        CAD  . Heart disease Son        MI  . Colon cancer Neg Hx   . Prostate cancer Neg Hx     ALLERGIES:  is allergic to ace  inhibitors; angiotensin receptor blockers; and enalapril.  MEDICATIONS:  Current Outpatient Medications  Medication Sig Dispense Refill  . albuterol (VENTOLIN HFA) 108 (90 Base) MCG/ACT inhaler Inhale 1-2 puffs into the lungs every 6 (six) hours as needed for shortness of breath. 18 g 1  . aspirin 81 MG EC tablet Take 81 mg by mouth daily.      Marland Kitchen buPROPion (WELLBUTRIN SR) 150 MG 12 hr tablet Take 1 tablet (150 mg total) by mouth in the morning.    . carvedilol (COREG) 6.25 MG tablet TAKE 1 TABLET BY MOUTH TWICE A DAY 180 tablet 0  . cyclobenzaprine (FLEXERIL) 5 MG tablet Take 1 tablet (5 mg total) by mouth 3 (three) times daily as needed for muscle spasms.    . dorzolamide-timolol (COSOPT) 22.3-6.8 MG/ML ophthalmic solution Place 1 drop into both eyes 2 (two) times daily.    Marland Kitchen gemfibrozil (LOPID) 600 MG tablet TAKE 1 TABLET BY MOUTH TWICE A DAY 180 tablet 3  . isosorbide mononitrate (IMDUR) 30 MG 24 hr tablet TAKE 1/2 TABLET BY MOUTH DAILY. 45 tablet 0  . ketoconazole (NIZORAL) 2 % cream Apply 1 application topically 2 (two) times daily  as needed for irritation. 15 g 1  . meclizine (ANTIVERT) 12.5 MG tablet Take 1-2 tablets (12.5-25 mg total) by mouth 3 (three) times daily as needed for dizziness.    Marland Kitchen MYRBETRIQ 50 MG TB24 tablet Take 50 mg by mouth daily.    . nitroGLYCERIN (NITROSTAT) 0.4 MG SL tablet Place 1 tablet (0.4 mg total) under the tongue every 5 (five) minutes as needed for chest pain. Up to 3 doses 25 tablet 3  . Omega-3 Fatty Acids (FISH OIL) 1000 MG CAPS Take 1,000 mg by mouth daily.     Vladimir Faster Glycol-Propyl Glycol (SYSTANE) 0.4-0.3 % GEL ophthalmic gel Place 1 application into the left eye at bedtime.    . pravastatin (PRAVACHOL) 40 MG tablet TAKE 1 TABLET BY MOUTH EVERYDAY AT BEDTIME 90 tablet 2  . RIVAROXABAN (XARELTO) VTE STARTER PACK (15 & 20 MG TABLETS) Follow package directions: Take one 15mg  tablet by mouth twice a day. On day 22, switch to one 20mg  tablet once a day. Take with food. 51 each 0  . tamsulosin (FLOMAX) 0.4 MG CAPS capsule Take 2 capsules (0.8 mg total) by mouth daily.    . traZODone (DESYREL) 100 MG tablet TAKE 2 TABLETS (200 MG TOTAL) BY MOUTH AT BEDTIME. 180 tablet 0   No current facility-administered medications for this visit.    REVIEW OF SYSTEMS:    10 Point review of Systems was done is negative except as noted above.  PHYSICAL EXAMINATION: ECOG PERFORMANCE STATUS: 0 - Asymptomatic  . Vitals:   04/16/20 1125  BP: 140/86  Pulse: 79  Resp: 20  Temp: 98.5 F (36.9 C)  SpO2: 95%   Filed Weights   04/16/20 1125  Weight: 170 lb 3.2 oz (77.2 kg)   .Body mass index is 25.13 kg/m.  GENERAL:alert, in no acute distress and comfortable SKIN: no acute rashes, no significant lesions EYES: conjunctiva are pink and non-injected, sclera anicteric OROPHARYNX: MMM, no exudates, no oropharyngeal erythema or ulceration NECK: supple, no JVD LYMPH:  no palpable lymphadenopathy in the cervical, axillary or inguinal regions LUNGS: clear to auscultation b/l with normal respiratory  effort HEART: regular rate & rhythm ABDOMEN:  normoactive bowel sounds , non tender, not distended. Extremity: no pedal edema PSYCH: alert & oriented x 3 with fluent speech NEURO: no focal  motor/sensory deficits  LABORATORY DATA:  I have reviewed the data as listed  . CBC Latest Ref Rng & Units 04/16/2020 04/07/2020 04/06/2020  WBC 4.0 - 10.5 K/uL 6.7 9.4 10.9(H)  Hemoglobin 13.0 - 17.0 g/dL 16.0 15.1 14.8  Hematocrit 39.0 - 52.0 % 47.4 43.7 44.1  Platelets 150 - 400 K/uL 329 192 173    . CMP Latest Ref Rng & Units 04/16/2020 04/07/2020 04/06/2020  Glucose 70 - 99 mg/dL 163(H) 178(H) 184(H)  BUN 8 - 23 mg/dL 14 19 24(H)  Creatinine 0.61 - 1.24 mg/dL 0.95 0.97 1.41(H)  Sodium 135 - 145 mmol/L 138 135 135  Potassium 3.5 - 5.1 mmol/L 4.5 4.5 4.3  Chloride 98 - 111 mmol/L 103 100 98  CO2 22 - 32 mmol/L 27 28 27   Calcium 8.9 - 10.3 mg/dL 9.7 8.4(L) 8.9  Total Protein 6.5 - 8.1 g/dL 7.3 - -  Total Bilirubin 0.3 - 1.2 mg/dL 0.5 - -  Alkaline Phos 38 - 126 U/L 63 - -  AST 15 - 41 U/L 14(L) - -  ALT 0 - 44 U/L 11 - -   Component     Latest Ref Rng & Units 04/04/2020 04/05/2020 04/06/2020 04/07/2020  WBC     4.0 - 10.5 K/uL 11.4 (H) 10.8 (H) 10.9 (H) 9.4    RADIOGRAPHIC STUDIES: I have personally reviewed the radiological images as listed and agreed with the findings in the report. DG Chest 2 View  Result Date: 04/04/2020 CLINICAL DATA:  79 year old male with pleuritic pain. EXAM: CHEST - 2 VIEW COMPARISON:  Chest radiographs 04/02/2020 and earlier. FINDINGS: PA and lateral views. Centrilobular emphysema demonstrated on a 2014 CT. Stable lung volumes and mediastinal contours. Visualized tracheal air column is within normal limits. No pneumothorax, pulmonary edema, pleural effusion or acute pulmonary opacity. No acute osseous abnormality identified. Negative visible bowel gas pattern. IMPRESSION: Emphysema (ICD10-J43.9). No acute cardiopulmonary abnormality. Electronically Signed   By: Genevie Ann  M.D.   On: 04/04/2020 19:09   DG Chest 2 View  Result Date: 04/02/2020 CLINICAL DATA:  Chronic obstructive pulmonary disease. EXAM: CHEST - 2 VIEW COMPARISON:  July 07, 2019. FINDINGS: The heart size and mediastinal contours are within normal limits. Both lungs are clear. No pneumothorax or pleural effusion is noted. The visualized skeletal structures are unremarkable. IMPRESSION: No active cardiopulmonary disease. Electronically Signed   By: Marijo Conception M.D.   On: 04/02/2020 15:21   CT ANGIO CHEST PE W OR WO CONTRAST  Result Date: 04/06/2020 CLINICAL DATA:  Shortness of breath, renal and splenic infarcts identified on prior CT of the abdomen and pelvis EXAM: CT ANGIOGRAPHY CHEST WITH CONTRAST TECHNIQUE: Multidetector CT imaging of the chest was performed using the standard protocol during bolus administration of intravenous contrast. Multiplanar CT image reconstructions and MIPs were obtained to evaluate the vascular anatomy. CONTRAST:  22mL OMNIPAQUE IOHEXOL 350 MG/ML SOLN COMPARISON:  CT abdomen pelvis, 04/04/2020, CT chest, 04/04/2013 FINDINGS: Cardiovascular: Satisfactory opacification of the pulmonary arteries to the segmental level. No evidence of pulmonary embolism. Mild cardiomegaly. Three-vessel coronary artery calcifications and/or stents. No pericardial effusion. Aortic atherosclerosis. Mediastinum/Nodes: Numerous prominent mediastinal lymph nodes, unchanged compared to CT dated 04/04/2013. Thyroid gland, trachea, and esophagus demonstrate no significant findings. Lungs/Pleura: Moderate to severe centrilobular emphysema. No pleural effusion or pneumothorax. Upper Abdomen: No acute abnormality. Musculoskeletal: No chest wall abnormality. No acute or significant osseous findings. Review of the MIP images confirms the above findings. IMPRESSION: 1.  Negative examination for  pulmonary embolism. 2.  Coronary artery disease.  Aortic Atherosclerosis (ICD10-I70.0). 3.  Emphysema (ICD10-J43.9). 4.  Numerous prominent mediastinal lymph nodes, unchanged compared to prior CT dated 2014. Electronically Signed   By: Eddie Candle M.D.   On: 04/06/2020 19:16   CT ABDOMEN PELVIS W CONTRAST  Result Date: 04/04/2020 CLINICAL DATA:  Left-sided abdominal pain, nausea since 3 a.m. EXAM: CT ABDOMEN AND PELVIS WITH CONTRAST TECHNIQUE: Multidetector CT imaging of the abdomen and pelvis was performed using the standard protocol following bolus administration of intravenous contrast. CONTRAST:  173mL OMNIPAQUE IOHEXOL 300 MG/ML  SOLN COMPARISON:  None. FINDINGS: Lower chest: No acute pleural or parenchymal lung disease. Hepatobiliary: No focal liver abnormality is seen. No gallstones, gallbladder wall thickening, or biliary dilatation. Pancreas: Unremarkable. No pancreatic ductal dilatation or surrounding inflammatory changes. Spleen: Wedge-shaped area of decreased attenuation within the spleen measures 5.3 x 7.3 cm, consistent with infarct. There is scattered atherosclerosis within the splenic artery but I do not see any focal occlusion. Adrenals/Urinary Tract: There is abnormal wedge-shaped decreased attenuation within the superolateral aspect left kidney, measuring 3.3 x 4.0 cm. Differential include infarct or focal pyelonephritis, though given the associated findings within the spleen infarct is suspected. The right kidney enhances normally. No urinary tract calculi or obstructive uropathy. Bladder is unremarkable. The adrenals are normal. Stomach/Bowel: No bowel obstruction or ileus. No bowel wall thickening or inflammatory change. Vascular/Lymphatic: Moderate atherosclerosis throughout the aorta and its branches. No aneurysm or dissection. No pathologic adenopathy. Reproductive: There is heterogeneous enlargement of the prostate, measuring 5.6 x 6.0 cm. Other: No free fluid or free gas. Musculoskeletal: No acute or destructive bony lesions. Multilevel lumbar spondylosis. Reconstructed images demonstrate no additional  findings. IMPRESSION: 1. Wedge-shaped splenic hypodensity, most compatible with infarct. 2. Wedge-shaped hypodensity superolateral left kidney. Wall focal pyelonephritis could give this appearance, infarct is more likely given the associated findings within the spleen. Multi-vessel infarct distribution may suggest an embolic source. 3. Enlarged prostate. 4. Atherosclerosis. Electronically Signed   By: Randa Ngo M.D.   On: 04/04/2020 19:12   ECHOCARDIOGRAM COMPLETE  Result Date: 04/05/2020    ECHOCARDIOGRAM REPORT   Patient Name:   JERRYD Fernandez Date of Exam: 04/05/2020 Medical Rec #:  [id number]     Height:       69.0 in Accession #:    [id number]    Weight:       173.7 lb Date of Birth:  04-Apr-1941     BSA:          1.946 m Patient Age:    71 years      BP:           144/96 mmHg Patient Gender: M             HR:           89 bpm. Exam Location:  Inpatient Procedure: 2D Echo, Cardiac Doppler and Color Doppler Indications:    Atrial fibrillation  History:        Patient has no prior history of Echocardiogram examinations. CAD                 and Previous Myocardial Infarction, COPD; Risk Factors:Diabetes,                 Hypertension and Dyslipidemia. Spleenic infarct.  Sonographer:    Dustin Flock Referring Phys: [id number] MIR MOHAMMED Sterling  1. Left ventricular ejection fraction, by estimation, is 35 to 40%. The left ventricle has moderately  decreased function. The left ventricle demonstrates global hypokinesis. Left ventricular diastolic parameters are consistent with Grade I diastolic dysfunction (impaired relaxation).  2. Right ventricular systolic function is normal. The right ventricular size is normal.  3. The mitral valve is normal in structure. No evidence of mitral valve regurgitation. No evidence of mitral stenosis.  4. The aortic valve is normal in structure. Aortic valve regurgitation is not visualized. No aortic stenosis is present.  5. The inferior vena cava is normal in  size with greater than 50% respiratory variability, suggesting right atrial pressure of 3 mmHg. FINDINGS  Left Ventricle: Left ventricular ejection fraction, by estimation, is 35 to 40%. The left ventricle has moderately decreased function. The left ventricle demonstrates global hypokinesis. The left ventricular internal cavity size was normal in size. There is no left ventricular hypertrophy. Left ventricular diastolic parameters are consistent with Grade I diastolic dysfunction (impaired relaxation). Normal left ventricular filling pressure. Right Ventricle: The right ventricular size is normal. No increase in right ventricular wall thickness. Right ventricular systolic function is normal. Left Atrium: Left atrial size was normal in size. Right Atrium: Right atrial size was normal in size. Pericardium: There is no evidence of pericardial effusion. Mitral Valve: The mitral valve is normal in structure. Normal mobility of the mitral valve leaflets. No evidence of mitral valve regurgitation. No evidence of mitral valve stenosis. Tricuspid Valve: The tricuspid valve is normal in structure. Tricuspid valve regurgitation is not demonstrated. No evidence of tricuspid stenosis. Aortic Valve: The aortic valve is normal in structure. Aortic valve regurgitation is not visualized. No aortic stenosis is present. Pulmonic Valve: The pulmonic valve was normal in structure. Pulmonic valve regurgitation is not visualized. No evidence of pulmonic stenosis. Aorta: The aortic root is normal in size and structure. Venous: The inferior vena cava is normal in size with greater than 50% respiratory variability, suggesting right atrial pressure of 3 mmHg. IAS/Shunts: No atrial level shunt detected by color flow Doppler.  LEFT VENTRICLE PLAX 2D LVIDd:         5.00 cm  Diastology LVIDs:         3.80 cm  LV e' lateral:   7.51 cm/s LV PW:         1.20 cm  LV E/e' lateral: 6.6 LV IVS:        0.90 cm  LV e' medial:    5.44 cm/s LVOT diam:      2.30 cm  LV E/e' medial:  9.1 LV SV:         73 LV SV Index:   38 LVOT Area:     4.15 cm  RIGHT VENTRICLE RV Basal diam:  3.00 cm RV S prime:     10.00 cm/s TAPSE (M-mode): 2.8 cm LEFT ATRIUM             Index       RIGHT ATRIUM           Index LA diam:        3.70 cm 1.90 cm/m  RA Area:     17.80 cm LA Vol (A2C):   45.6 ml 23.43 ml/m RA Volume:   51.80 ml  26.62 ml/m LA Vol (A4C):   63.0 ml 32.38 ml/m LA Biplane Vol: 58.0 ml 29.81 ml/m  AORTIC VALVE LVOT Vmax:   88.80 cm/s LVOT Vmean:  64.400 cm/s LVOT VTI:    0.176 m  AORTA Ao Root diam: 3.60 cm MITRAL VALVE MV Area (PHT): 7.02 cm  SHUNTS MV Decel Time: 108 msec    Systemic VTI:  0.18 m MV E velocity: 49.70 cm/s  Systemic Diam: 2.30 cm MV A velocity: 91.30 cm/s MV E/A ratio:  0.54 Ena Dawley MD Electronically signed by Ena Dawley MD Signature Date/Time: 04/05/2020/10:53:04 PM    Final    VAS [place] LOWER EXTREMITY VENOUS (DVT)  Result Date: 04/07/2020  Lower Venous DVTStudy Indications: Edema.  Risk Factors: None identified. Limitations: Prior L knee injury. Comparison Study: No prior studies. Performing Technologist: Oliver Hum RVT  Examination Guidelines: A complete evaluation includes B-mode imaging, spectral Doppler, color Doppler, and power Doppler as needed of all accessible portions of each vessel. Bilateral testing is considered an integral part of a complete examination. Limited examinations for reoccurring indications may be performed as noted. The reflux portion of the exam is performed with the patient in reverse Trendelenburg.  +---------+---------------+---------+-----------+----------+--------------+ RIGHT    CompressibilityPhasicitySpontaneityPropertiesThrombus Aging +---------+---------------+---------+-----------+----------+--------------+ CFV      Full           Yes      Yes                                 +---------+---------------+---------+-----------+----------+--------------+ SFJ      Full                                                         +---------+---------------+---------+-----------+----------+--------------+ FV Prox  Full                                                        +---------+---------------+---------+-----------+----------+--------------+ FV Mid   Full                                                        +---------+---------------+---------+-----------+----------+--------------+ FV DistalFull                                                        +---------+---------------+---------+-----------+----------+--------------+ PFV      Full                                                        +---------+---------------+---------+-----------+----------+--------------+ POP      Full           Yes      Yes                                 +---------+---------------+---------+-----------+----------+--------------+ PTV      Full                                                        +---------+---------------+---------+-----------+----------+--------------+  PERO     Full                                                        +---------+---------------+---------+-----------+----------+--------------+   +---------+---------------+---------+-----------+----------+--------------+ LEFT     CompressibilityPhasicitySpontaneityPropertiesThrombus Aging +---------+---------------+---------+-----------+----------+--------------+ CFV      Full           Yes      Yes                                 +---------+---------------+---------+-----------+----------+--------------+ SFJ      Full                                                        +---------+---------------+---------+-----------+----------+--------------+ FV Prox  Full                                                        +---------+---------------+---------+-----------+----------+--------------+ FV Mid   Full                                                         +---------+---------------+---------+-----------+----------+--------------+ FV DistalFull                                                        +---------+---------------+---------+-----------+----------+--------------+ PFV      Full                                                        +---------+---------------+---------+-----------+----------+--------------+ POP      Full           Yes      Yes                                 +---------+---------------+---------+-----------+----------+--------------+ PTV      Full                                                        +---------+---------------+---------+-----------+----------+--------------+ PERO     Full                                                        +---------+---------------+---------+-----------+----------+--------------+  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 Per patient, image quality is suboptimal in the popliteal space likely due to a previous injury.    Summary: RIGHT: - There is no evidence of deep vein thrombosis in the lower extremity.  - No cystic structure found in the popliteal fossa.  LEFT: - There is no evidence of deep vein thrombosis in the lower extremity.  - No cystic structure found in the popliteal fossa. 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 Per patient, image quality is suboptimal in the popliteal space likely due to a previous injury.  *See table(s) above for measurements and observations. Electronically signed by Monica Martinez MD on 04/07/2020 at 3:09:57 PM.    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 -RBC and PLT were normal  -Discussed 04/04/2020 CT Abd/Pel ([id number]) which revealed  "1. Wedge-shaped splenic hypodensity, most compatible with infarct. 2. Wedge-shaped hypodensity superolateral left kidney. Wall focal pyelonephritis could give this appearance, infarct is more likely given the associated findings within the spleen. Multi-vessel infarct distribution may suggest an embolic source." -Discussed 04/05/2020 ECHO ([id number]) which revealed "1. Left ventricular ejection fraction, by estimation, is 35 to 40%. The left ventricle has moderately decreased function. The left ventricle demonstrates global hypokinesis. Left ventricular diastolic parameters are consistent with Grade I diastolic dysfunction (impaired relaxation). 2. Right ventricular systolic function is normal. The right ventricular size is normal. 3. The mitral valve is normal in structure. No evidence of mitral valve regurgitation. No evidence of mitral stenosis. 4. The aortic valve is normal in structure. Aortic valve regurgitation is not visualized. No aortic stenosis is present.5. The inferior vena cava is normal in size with greater than 50% respiratory variability, suggesting right atrial pressure of 3 mmHg." -Recommend steam inhaler to assist with phlegm breakdown -Advised pt that his increased ecchymosis could simply be senile purpura -Recommended that the pt continue to eat heart-healthy food, drink at least 48-64 oz of water each day, and walk 20-30 minutes each day.  -Advised pt that left ventricle dysfunction could have been the cause of his blood clot. Cannot r/o afib. -Recommend pt continue Xarelto long-term to the unprovoked nature of his blood clot -Recommend pt f/u with Dr. Damita Dunnings for anticoagulation management  -Recommend pt f/u with Dr. Johnsie Cancel to consider w/o to r/o Afib or other cardio-embolic and athero-embolic source of emboli -Will get labs today to r/o clotting disorder  -Will see back in 2 weeks via phone    FOLLOW UP: Labs today Phone visit with Dr Irene Limbo in 2 weeks  . Orders Placed  This Encounter  Procedures  . CBC with Differential/Platelet    Standing Status:   Future    Number of Occurrences:   1    Standing Expiration Date:   05/21/2021  . CMP ([place] only)    Standing Status:   Future    Number of Occurrences:   1    Standing Expiration Date:   04/16/2021  . Factor 5 leiden    Standing Status:   Future    Number of Occurrences:   1    Standing Expiration Date:   05/21/2021  . Prothrombin gene mutation    Standing Status:   Future    Number of  Occurrences:   1    Standing Expiration Date:   05/21/2021  . Lupus anticoagulant panel    Standing Status:   Future    Number of Occurrences:   1    Standing Expiration Date:   05/21/2021  . Cardiolipin antibodies, IgG, IgM, IgA    Standing Status:   Future    Number of Occurrences:   1    Standing Expiration Date:   04/16/2021  . Beta-2-glycoprotein i abs, IgG/M/A    Standing Status:   Future    Number of Occurrences:   1    Standing Expiration Date:   04/16/2021  . JAK2 (including V617F and Exon 12), MPL, and CALR-Next Generation Sequencing    Standing Status:   Future    Number of Occurrences:   1    Standing Expiration Date:   04/16/2021    All of the patients questions were answered with apparent satisfaction. The patient knows to call the clinic with any problems, questions or concerns.  I spent 35 mins counseling the patient face to face. The total time spent in the appointment was 45 minutes and more than 50% was on counseling and direct patient cares.    Sullivan Lone MD [place] AAHIVMS [facility] Hematology/Oncology Physician [facility]  (Office):       [phone] (Work cell):  [phone] (Fax):           [phone]  04/16/2020 4:43 PM  I, Yevette Edwards, am acting as a scribe for Dr. Sullivan Lone.   .I have reviewed the above documentation for accuracy and completeness, and I agree with the above. Brunetta Genera MD

## 2020-04-16 NOTE — Patient Instructions (Signed)
Thank you for choosing [facility] to provide your oncology and hematology care.   Should you have questions after your visit to the [facility] ([facility]), please contact this office at [phone] between 8:30 AM and 4:30 PM.  Voice mails left after 4:00 PM may not be returned until the following business day.  Calls received after 4:30 PM will be answered by an off-site Nurse Triage Line.    Prescription Refills:  Please have your pharmacy contact us directly for most prescription requests.  Contact the office directly for refills of narcotics (pain medications). Allow 48-72 hours for refills.  Appointments: Please contact the [facility] scheduling department [phone] for questions regarding [facility] appointment scheduling.  Contact the schedulers with any scheduling changes so that your appointment can be rescheduled in a timely manner.   Central Scheduling for [place] [phone] - Call to schedule procedures such as PET scans, CT scans, MRI, Ultrasound, etc.  To afford each patient quality time with our providers, please arrive 30 minutes before your scheduled appointment time.  If you arrive late for your appointment, you may be asked to reschedule.  We strive to give you quality time with our providers, and arriving late affects you and other patients whose appointments are after yours. If you are a no show for multiple scheduled visits, you may be dismissed from the clinic at the providers discretion.     Resources: [facility] Social Workers [phone] for additional information on assistance programs or assistance connecting with community support programs   [place] DSS  336-641-3447: Information regarding food stamps, Medicaid, and utility assistance [place] [phone]   [place] [employer]'s shared-ride transportation service for eligible riders who have a disability that prevents them from riding the fixed route bus.   Medicare Rights Center  800-333-4114 Helps people with Medicare understand their rights and benefits, navigate the Medicare system, and secure the quality healthcare they deserve [employer] 800-227-2345 Assists patients locate various types of support and financial assistance Cancer Care: 1-800-813-HOPE (4673) Provides financial assistance, online support groups, medication/co-pay assistance.   Transportation Assistance for appointments at [facility]: Transportation Coordinator [phone]  Again, thank you for choosing [facility] for your care.       

## 2020-04-17 LAB — CARDIOLIPIN ANTIBODIES, IGG, IGM, IGA
Anticardiolipin IgA: <9 APL U/mL (ref 0–11)
Anticardiolipin IgG: <9 GPL U/mL (ref 0–14)
Anticardiolipin IgM: <9 MPL U/mL (ref 0–12)

## 2020-04-17 LAB — BETA-2-GLYCOPROTEIN I ABS, IGG/M/A
Beta-2 Glyco I IgG: <9 GPI IgG units (ref 0–20)
Beta-2-Glycoprotein I IgA: 9 GPI IgA units (ref 0–25)
Beta-2-Glycoprotein I IgM: <9 GPI IgM units (ref 0–32)

## 2020-04-17 NOTE — Assessment & Plan Note (Signed)
New onset splenic and renal infarcts.  Presumed to be concurrent/single issue.  No DVT seen on ultrasound.  This is his first event and is currently anticoagulated.  Tolerating anticoagulation without bleeding.  Discussed hematology follow-up.  Referral done.  Rationale discussed with patient.  Clotting pathophysiology in general discussed with patient.  It does not appear that he needs follow-up labs at this point.  At this point still okay for outpatient follow-up as he does not have abdominal pain or any alarming symptoms right now.  The soft bulge on his abdominal wall looks to be a benign issue that does not have an associated mass.  There is no reason to suspect an occult mass in that area given the negative CT.   At least 30 minutes were devoted to patient care in this encounter (this can potentially include time spent reviewing the patient's file/history, interviewing and examining the patient, counseling/reviewing plan with patient, ordering referrals, ordering tests, reviewing relevant laboratory or x-ray data, and documenting the encounter).

## 2020-04-18 LAB — LUPUS ANTICOAGULANT PANEL
DRVVT: 162.4 s — ABNORMAL HIGH (ref 0.0–47.0)
PTT Lupus Anticoagulant: 41.7 s (ref 0.0–51.9)

## 2020-04-18 LAB — DRVVT CONFIRM: dRVVT Confirm: 2 ratio — ABNORMAL HIGH (ref 0.8–1.2)

## 2020-04-18 LAB — DRVVT MIX: dRVVT Mix: 87.2 s — ABNORMAL HIGH (ref 0.0–40.4)

## 2020-04-19 LAB — PROTHROMBIN GENE MUTATION

## 2020-04-23 LAB — FACTOR 5 LEIDEN

## 2020-04-24 ENCOUNTER — Telehealth: Payer: Self-pay | Admitting: Family Medicine

## 2020-04-24 DIAGNOSIS — D735 Infarction of spleen: Secondary | ICD-10-CM

## 2020-04-24 NOTE — Telephone Encounter (Signed)
Dr. Johnsie Cancel- please get this patient scheduled in your clinic to consider w/u to r/o Afib or other cardio-embolic and athero-embolic source of emboli.  Thanks.

## 2020-04-30 ENCOUNTER — Inpatient Hospital Stay: Payer: Medicare Other | Attending: Hematology | Admitting: Hematology

## 2020-04-30 DIAGNOSIS — D6859 Other primary thrombophilia: Secondary | ICD-10-CM | POA: Diagnosis not present

## 2020-04-30 DIAGNOSIS — Z7901 Long term (current) use of anticoagulants: Secondary | ICD-10-CM | POA: Diagnosis not present

## 2020-04-30 DIAGNOSIS — D735 Infarction of spleen: Secondary | ICD-10-CM

## 2020-04-30 DIAGNOSIS — N280 Ischemia and infarction of kidney: Secondary | ICD-10-CM

## 2020-04-30 MED ORDER — RIVAROXABAN 20 MG PO TABS: 20.0000 mg | ORAL_TABLET | Freq: Every day | ORAL | 1 refills | Status: DC

## 2020-04-30 NOTE — Progress Notes (Signed)
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 male who has been referred to [place] by Dr. 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 Pt is accompanied today by his wife. The pt reports that he is doing well overall.   The pt reports has had no personal or family history of blood clots. Pt has been on a baby Aspirin for 20+ years for his CAD. He was having sharp left-sided abdominal pain prior to going to the ED on 04/04/2020. His abdominal pain began to subside after he checked into the hospital and resolved by 04/06/2020. Pt received his second dose of the Pfizer vaccine about one month prior to this incident.   Pt stopped smoking cigarettes 3 years ago and has COPD. He has never been placed on oxygen chronically. He has had some increased fatigue, which he attributes to age, and increased bruising over the the last year. Pt is currently experiencing a productive cough and has no history of seasonal allergies.   Of note prior to the patient's visit today, pt has had CT Abd/Pel ([id number]) completed on 04/04/2020 with results revealing "1. Wedge-shaped splenic hypodensity, most compatible with infarct. 2. Wedge-shaped hypodensity superolateral left kidney. Wall focal pyelonephritis could give this appearance, infarct is more likely given the associated findings within the spleen. Multi-vessel infarct distribution may suggest an embolic source. 3. Enlarged prostate. 4. Atherosclerosis."   Pt has had ECHO ([id number]) completed on 04/05/2020 with results revealing "1. Left ventricular ejection  fraction, by estimation, is 35 to 40%. The left ventricle has moderately decreased function. The left ventricle demonstrates global hypokinesis. Left ventricular diastolic parameters are consistent with Grade I diastolic dysfunction (impaired relaxation). 2. Right ventricular systolic function is normal. The right ventricular size is normal. 3. The mitral valve is normal in structure. No evidence of mitral valve regurgitation. No evidence of mitral stenosis. 4. The aortic valve is normal in structure. Aortic valve regurgitation is not visualized. No aortic stenosis is present.5. The inferior vena cava is normal in size with greater than 50% respiratory variability, suggesting right atrial pressure of 3 mmHg."  Most recent lab results (04/07/2020) of CBC and BMP is as follows: all values are WNL except for Glucose at 178, Calcium at 8.4.  On review of systems, pt reports productive cough, bruising, fatigue and denies SOB, unexpected weight loss, constipation, diarrhea, abdominal pain, testicular pain/swelling and any other symptoms.   On PMHx the pt reports CAD, COPD, Diabetes Type II, HTN, Dyslipidemia, Left Heart Cath and Coronary Angiography. On Social Hx the pt reports that he quit smoking 3 years ago.   INTERVAL HISTORY:  I connected with  Nathan Fernandez on 04/30/20 by telephone and verified that I am speaking with the correct person using two identifiers.   I discussed the limitations of evaluation and management by telemedicine. The patient expressed understanding and agreed to proceed.  Other persons participating in the visit and their role in the encounter:       -Nathan Fernandez, Medical Scribe      -Nathan Fernandez, Pt's wife   Patient's location: Home Provider's location: [facility] [place]  [address]  Nathan Fernandez is a wonderful 79 y.o. 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 The patient's last visit with [place] was on 04/16/2020. The pt  reports that he is doing well overall.  The pt reports that he has not had any abdominal pain since our last visit. He was unable to see Nathan Fernandez, Cardiology, due to being in the hospital but plans on rescheduling his appointment. Pt is currently seeing his PCP, Dr. Damita Fernandez, every 6 months.   Of note since the patient's last visit, pt has had JAK2, MPL, CALR mutation studies completed on 04/16/2020 with results revealing "No Mutations Identified".  Lab results (04/16/20) of CBC w/diff and CMP is as follows: all values are WNL except for Glucose at 163, Albumin at 3.2, AST at 14. 04/16/2020 Beta-2-glycoprotein i abs, IgG/M/A is as follows: Beta-2 Glyco I IgG at <9, Beta-2-Glycoprotein I IgM at <9, Beta-2-Glycoprotein I IgA at 9 04/16/2020 Cardiolipin antibodies, IgG, IgM, IgA is as follows: Anticardiolipin IgG at <9, Anticardiolipin IgM at <9, Anticardiolipin IgA at <9 04/16/2020 Lupus anticoagulant panel is as follows: PTT lupus Anticoagulant at 41.7, DRVVT at 162.4 04/16/2020 PTGENE is "Negative - No mutation identified." 04/16/2020 F5LEID is "Negative - No mutation identified." 04/16/2020 dRVVT Mix at 87.2 04/16/2020 dRVVT Confirm at 2.0  On review of systems, pt denies abdominal pain and any other symptoms.   MEDICAL HISTORY:  Past Medical History:  Diagnosis Date  . 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   Hosp R facial weakness  . CAD (coronary artery disease)    MI, [place], PTCA; [place] 04/18/13: Distal left main 40%, proximal LAD with several aneurysmal segments, proximal LAD 50% prior to and after aneurysmal segments, area does not appear to flow-limiting, proximal diagonal 70%, ostial circumflex 20%, proximal circumflex 20%, OM1 30-40%, proximal RCA 40%, mid RCA 30%, distal RCA 20%, EF 50% => med Rx  . COPD (chronic obstructive pulmonary disease) ([place])   . DM2 (diabetes mellitus, type 2) ([place])   . History of ETT 1998   wnl  . History of MRI 08/04/09   brain- atrophy sm vess dz  . HLD  (hyperlipidemia)   . HTN (hypertension)   . OA (osteoarthritis)     SURGICAL HISTORY: Past Surgical History:  Procedure Laterality Date  . cataract surgery  09/06   repair lens which moved  . EYE SURGERY    . LEFT HEART CATH AND CORONARY ANGIOGRAPHY N/A 01/21/2019   Procedure: LEFT HEART CATH AND CORONARY ANGIOGRAPHY;  Surgeon: Burnell Blanks, MD;  Location: [facility];  Service: Cardiovascular;  Laterality: N/A;  . stress myoview  06/29/06   sm distal anteroseptal & apical infarct  . THYROIDECTOMY, PARTIAL  1967   B9 growth    SOCIAL HISTORY: Social History   Socioeconomic History  . Marital status: Married    Spouse name: Not on file  . Number of children: 4  . Years of education: Not on file  . Highest education level: Not on file  Occupational History  . 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 Occupation: retired  Tobacco Use  . Smoking status: Former Smoker    Types: Cigars  . Smokeless tobacco: Never Used  . Tobacco comment: cigar occassionally  Substance and Sexual Activity  . Alcohol use: Not Currently    Alcohol/week: 0.0 standard drinks  . Drug use: No  .  Sexual activity: Not on file  Other Topics Concern  . 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 Difficulty of Paying Living Expenses: Not hard at all  Food Insecurity: No Food Insecurity  . Worried About Charity fundraiser in the Last Year: Never true  . Ran Out of Food in the Last Year: Never true  Transportation Needs: No Transportation Needs  . Lack of Transportation (Medical): No  . Lack of Transportation (Non-Medical): No  Physical Activity: Inactive  . Days of  Exercise per Week: 0 days  . Minutes of Exercise per Session: 0 min  Stress: No Stress Concern Present  . Feeling of Stress : Not at all  Social Connections:   . Frequency of Communication with Friends and Family:   . Frequency of Social Gatherings with Friends and Family:   . Attends Religious Services:   . Active Member of Clubs or Organizations:   . Attends Archivist Meetings:   Marland Kitchen Marital Status:   Intimate Partner Violence: Not At Risk  . Fear of Current or Ex-Partner: No  . Emotionally Abused: No  . Physically Abused: No  . Sexually Abused: No    FAMILY HISTORY: Family History  Problem Relation Age of Onset  . Hypertension Mother   . Aneurysm Mother   . Stroke Mother   . Hypertension Father   . Heart disease Father        CAD  . Heart disease Son        MI  . Colon cancer Neg Hx   . Prostate cancer Neg Hx     ALLERGIES:  is allergic to ace inhibitors; angiotensin receptor blockers; and enalapril.  MEDICATIONS:  Current Outpatient Medications  Medication Sig Dispense Refill  . albuterol (VENTOLIN HFA) 108 (90 Base) MCG/ACT inhaler Inhale 1-2 puffs into the lungs every 6 (six) hours as needed for shortness of breath. 18 g 1  . aspirin 81 MG EC tablet Take 81 mg by mouth daily.      Marland Kitchen buPROPion (WELLBUTRIN SR) 150 MG 12 hr tablet Take 1 tablet (150 mg total) by mouth in the morning.    . carvedilol (COREG) 6.25 MG tablet TAKE 1 TABLET BY MOUTH TWICE A DAY 180 tablet 0  . cyclobenzaprine (FLEXERIL) 5 MG tablet Take 1 tablet (5 mg total) by mouth 3 (three) times daily as needed for muscle spasms.    . dorzolamide-timolol (COSOPT) 22.3-6.8 MG/ML ophthalmic solution Place 1 drop into both eyes 2 (two) times daily.    Marland Kitchen gemfibrozil (LOPID) 600 MG tablet TAKE 1 TABLET BY MOUTH TWICE A DAY 180 tablet 3  . isosorbide mononitrate (IMDUR) 30 MG 24 hr tablet TAKE 1/2 TABLET BY MOUTH DAILY. 45 tablet 0  . ketoconazole (NIZORAL) 2 % cream Apply 1 application topically 2  (two) times daily as needed for irritation. 15 g 1  . meclizine (ANTIVERT) 12.5 MG tablet Take 1-2 tablets (12.5-25 mg total) by mouth 3 (three) times daily as needed for dizziness.    Marland Kitchen MYRBETRIQ 50 MG TB24 tablet Take 50 mg by mouth daily.    . nitroGLYCERIN (NITROSTAT) 0.4 MG SL tablet Place 1 tablet (0.4 mg total) under the tongue every 5 (  five) minutes as needed for chest pain. Up to 3 doses 25 tablet 3  . Omega-3 Fatty Acids (FISH OIL) 1000 MG CAPS Take 1,000 mg by mouth daily.     Vladimir Faster Glycol-Propyl Glycol (SYSTANE) 0.4-0.3 % GEL ophthalmic gel Place 1 application into the left eye at bedtime.    . pravastatin (PRAVACHOL) 40 MG tablet TAKE 1 TABLET BY MOUTH EVERYDAY AT BEDTIME 90 tablet 2  . rivaroxaban (XARELTO) 20 MG TABS tablet Take 1 tablet (20 mg total) by mouth daily with supper. 30 tablet 1  . tamsulosin (FLOMAX) 0.4 MG CAPS capsule Take 2 capsules (0.8 mg total) by mouth daily.    . traZODone (DESYREL) 100 MG tablet TAKE 2 TABLETS (200 MG TOTAL) BY MOUTH AT BEDTIME. 180 tablet 0   No current facility-administered medications for this visit.    REVIEW OF SYSTEMS:   A 10+ POINT REVIEW OF SYSTEMS WAS OBTAINED including neurology, dermatology, psychiatry, cardiac, respiratory, lymph, extremities, GI, GU, Musculoskeletal, constitutional, breasts, reproductive, HEENT.  All pertinent positives are noted in the HPI.  All others are negative.   PHYSICAL EXAMINATION: ECOG PERFORMANCE STATUS: 0 - Asymptomatic  . Telehealth visit  LABORATORY DATA:  I have reviewed the data as listed  . CBC Latest Ref Rng & Units 04/16/2020 04/07/2020 04/06/2020  WBC 4.0 - 10.5 K/uL 6.7 9.4 10.9(H)  Hemoglobin 13.0 - 17.0 g/dL 16.0 15.1 14.8  Hematocrit 39.0 - 52.0 % 47.4 43.7 44.1  Platelets 150 - 400 K/uL 329 192 173    . CMP Latest Ref Rng & Units 04/16/2020 04/07/2020 04/06/2020  Glucose 70 - 99 mg/dL 163(H) 178(H) 184(H)  BUN 8 - 23 mg/dL 14 19 24(H)  Creatinine 0.61 - 1.24 mg/dL 0.95  0.97 1.41(H)  Sodium 135 - 145 mmol/L 138 135 135  Potassium 3.5 - 5.1 mmol/L 4.5 4.5 4.3  Chloride 98 - 111 mmol/L 103 100 98  CO2 22 - 32 mmol/L 27 28 27   Calcium 8.9 - 10.3 mg/dL 9.7 8.4(L) 8.9  Total Protein 6.5 - 8.1 g/dL 7.3 - -  Total Bilirubin 0.3 - 1.2 mg/dL 0.5 - -  Alkaline Phos 38 - 126 U/L 63 - -  AST 15 - 41 U/L 14(L) - -  ALT 0 - 44 U/L 11 - -   Component     Latest Ref Rng & Units 04/04/2020 04/05/2020 04/06/2020 04/07/2020  WBC     4.0 - 10.5 K/uL 11.4 (H) 10.8 (H) 10.9 (H) 9.4    RADIOGRAPHIC STUDIES: I have personally reviewed the radiological images as listed and agreed with the findings in the report. DG Chest 2 View  Result Date: 04/04/2020 CLINICAL DATA:  79 year old male with pleuritic pain. EXAM: CHEST - 2 VIEW COMPARISON:  Chest radiographs 04/02/2020 and earlier. FINDINGS: PA and lateral views. Centrilobular emphysema demonstrated on a 2014 CT. Stable lung volumes and mediastinal contours. Visualized tracheal air column is within normal limits. No pneumothorax, pulmonary edema, pleural effusion or acute pulmonary opacity. No acute osseous abnormality identified. Negative visible bowel gas pattern. IMPRESSION: Emphysema (ICD10-J43.9). No acute cardiopulmonary abnormality. Electronically Signed   By: Genevie Ann M.D.   On: 04/04/2020 19:09   DG Chest 2 View  Result Date: 04/02/2020 CLINICAL DATA:  Chronic obstructive pulmonary disease. EXAM: CHEST - 2 VIEW COMPARISON:  July 07, 2019. FINDINGS: The heart size and mediastinal contours are within normal limits. Both lungs are clear. No pneumothorax or pleural effusion is noted. The visualized skeletal structures are unremarkable. IMPRESSION:  No active cardiopulmonary disease. Electronically Signed   By: Marijo Conception M.D.   On: 04/02/2020 15:21   CT ANGIO CHEST PE W OR WO CONTRAST  Result Date: 04/06/2020 CLINICAL DATA:  Shortness of breath, renal and splenic infarcts identified on prior CT of the abdomen and pelvis  EXAM: CT ANGIOGRAPHY CHEST WITH CONTRAST TECHNIQUE: Multidetector CT imaging of the chest was performed using the standard protocol during bolus administration of intravenous contrast. Multiplanar CT image reconstructions and MIPs were obtained to evaluate the vascular anatomy. CONTRAST:  20mL OMNIPAQUE IOHEXOL 350 MG/ML SOLN COMPARISON:  CT abdomen pelvis, 04/04/2020, CT chest, 04/04/2013 FINDINGS: Cardiovascular: Satisfactory opacification of the pulmonary arteries to the segmental level. No evidence of pulmonary embolism. Mild cardiomegaly. Three-vessel coronary artery calcifications and/or stents. No pericardial effusion. Aortic atherosclerosis. Mediastinum/Nodes: Numerous prominent mediastinal lymph nodes, unchanged compared to CT dated 04/04/2013. Thyroid gland, trachea, and esophagus demonstrate no significant findings. Lungs/Pleura: Moderate to severe centrilobular emphysema. No pleural effusion or pneumothorax. Upper Abdomen: No acute abnormality. Musculoskeletal: No chest wall abnormality. No acute or significant osseous findings. Review of the MIP images confirms the above findings. IMPRESSION: 1.  Negative examination for pulmonary embolism. 2.  Coronary artery disease.  Aortic Atherosclerosis (ICD10-I70.0). 3.  Emphysema (ICD10-J43.9). 4. Numerous prominent mediastinal lymph nodes, unchanged compared to prior CT dated 2014. Electronically Signed   By: Eddie Candle M.D.   On: 04/06/2020 19:16   CT ABDOMEN PELVIS W CONTRAST  Result Date: 04/04/2020 CLINICAL DATA:  Left-sided abdominal pain, nausea since 3 a.m. EXAM: CT ABDOMEN AND PELVIS WITH CONTRAST TECHNIQUE: Multidetector CT imaging of the abdomen and pelvis was performed using the standard protocol following bolus administration of intravenous contrast. CONTRAST:  131mL OMNIPAQUE IOHEXOL 300 MG/ML  SOLN COMPARISON:  None. FINDINGS: Lower chest: No acute pleural or parenchymal lung disease. Hepatobiliary: No focal liver abnormality is seen. No  gallstones, gallbladder wall thickening, or biliary dilatation. Pancreas: Unremarkable. No pancreatic ductal dilatation or surrounding inflammatory changes. Spleen: Wedge-shaped area of decreased attenuation within the spleen measures 5.3 x 7.3 cm, consistent with infarct. There is scattered atherosclerosis within the splenic artery but I do not see any focal occlusion. Adrenals/Urinary Tract: There is abnormal wedge-shaped decreased attenuation within the superolateral aspect left kidney, measuring 3.3 x 4.0 cm. Differential include infarct or focal pyelonephritis, though given the associated findings within the spleen infarct is suspected. The right kidney enhances normally. No urinary tract calculi or obstructive uropathy. Bladder is unremarkable. The adrenals are normal. Stomach/Bowel: No bowel obstruction or ileus. No bowel wall thickening or inflammatory change. Vascular/Lymphatic: Moderate atherosclerosis throughout the aorta and its branches. No aneurysm or dissection. No pathologic adenopathy. Reproductive: There is heterogeneous enlargement of the prostate, measuring 5.6 x 6.0 cm. Other: No free fluid or free gas. Musculoskeletal: No acute or destructive bony lesions. Multilevel lumbar spondylosis. Reconstructed images demonstrate no additional findings. IMPRESSION: 1. Wedge-shaped splenic hypodensity, most compatible with infarct. 2. Wedge-shaped hypodensity superolateral left kidney. Wall focal pyelonephritis could give this appearance, infarct is more likely given the associated findings within the spleen. Multi-vessel infarct distribution may suggest an embolic source. 3. Enlarged prostate. 4. Atherosclerosis. Electronically Signed   By: Randa Ngo M.D.   On: 04/04/2020 19:12   ECHOCARDIOGRAM COMPLETE  Result Date: 04/05/2020    ECHOCARDIOGRAM REPORT   Patient Name:   CALLETANO KURZWEIL Date of Exam: 04/05/2020 Medical Rec #:  [id number]     Height:       69.0 in Accession #:  [id number]    Weight:        173.7 lb Date of Birth:  1941-12-13     BSA:          1.946 m Patient Age:    6 years      BP:           144/96 mmHg Patient Gender: M             HR:           89 bpm. Exam Location:  Inpatient Procedure: 2D Echo, Cardiac Doppler and Color Doppler Indications:    Atrial fibrillation  History:        Patient has no prior history of Echocardiogram examinations. CAD                 and Previous Myocardial Infarction, COPD; Risk Factors:Diabetes,                 Hypertension and Dyslipidemia. Spleenic infarct.  Sonographer:    Dustin Flock Referring Phys: [id number] MIR MOHAMMED [place]  1. Left ventricular ejection fraction, by estimation, is 35 to 40%. The left ventricle has moderately decreased function. The left ventricle demonstrates global hypokinesis. Left ventricular diastolic parameters are consistent with Grade I diastolic dysfunction (impaired relaxation).  2. Right ventricular systolic function is normal. The right ventricular size is normal.  3. The mitral valve is normal in structure. No evidence of mitral valve regurgitation. No evidence of mitral stenosis.  4. The aortic valve is normal in structure. Aortic valve regurgitation is not visualized. No aortic stenosis is present.  5. The inferior vena cava is normal in size with greater than 50% respiratory variability, suggesting right atrial pressure of 3 mmHg. FINDINGS  Left Ventricle: Left ventricular ejection fraction, by estimation, is 35 to 40%. The left ventricle has moderately decreased function. The left ventricle demonstrates global hypokinesis. The left ventricular internal cavity size was normal in size. There is no left ventricular hypertrophy. Left ventricular diastolic parameters are consistent with Grade I diastolic dysfunction (impaired relaxation). Normal left ventricular filling pressure. Right Ventricle: The right ventricular size is normal. No increase in right ventricular wall thickness. Right ventricular  systolic function is normal. Left Atrium: Left atrial size was normal in size. Right Atrium: Right atrial size was normal in size. Pericardium: There is no evidence of pericardial effusion. Mitral Valve: The mitral valve is normal in structure. Normal mobility of the mitral valve leaflets. No evidence of mitral valve regurgitation. No evidence of mitral valve stenosis. Tricuspid Valve: The tricuspid valve is normal in structure. Tricuspid valve regurgitation is not demonstrated. No evidence of tricuspid stenosis. Aortic Valve: The aortic valve is normal in structure. Aortic valve regurgitation is not visualized. No aortic stenosis is present. Pulmonic Valve: The pulmonic valve was normal in structure. Pulmonic valve regurgitation is not visualized. No evidence of pulmonic stenosis. Aorta: The aortic root is normal in size and structure. Venous: The inferior vena cava is normal in size with greater than 50% respiratory variability, suggesting right atrial pressure of 3 mmHg. IAS/Shunts: No atrial level shunt detected by color flow Doppler.  LEFT VENTRICLE PLAX 2D LVIDd:         5.00 cm  Diastology LVIDs:         3.80 cm  LV e' lateral:   7.51 cm/s LV PW:         1.20 cm  LV E/e' lateral: 6.6 LV IVS:        0.90  cm  LV e' medial:    5.44 cm/s LVOT diam:     2.30 cm  LV E/e' medial:  9.1 LV SV:         73 LV SV Index:   38 LVOT Area:     4.15 cm  RIGHT VENTRICLE RV Basal diam:  3.00 cm RV S prime:     10.00 cm/s TAPSE (M-mode): 2.8 cm LEFT ATRIUM             Index       RIGHT ATRIUM           Index LA diam:        3.70 cm 1.90 cm/m  RA Area:     17.80 cm LA Vol (A2C):   45.6 ml 23.43 ml/m RA Volume:   51.80 ml  26.62 ml/m LA Vol (A4C):   63.0 ml 32.38 ml/m LA Biplane Vol: 58.0 ml 29.81 ml/m  AORTIC VALVE LVOT Vmax:   88.80 cm/s LVOT Vmean:  64.400 cm/s LVOT VTI:    0.176 m  AORTA Ao Root diam: 3.60 cm MITRAL VALVE MV Area (PHT): 7.02 cm    SHUNTS MV Decel Time: 108 msec    Systemic VTI:  0.18 m MV E velocity:  49.70 cm/s  Systemic Diam: 2.30 cm MV A velocity: 91.30 cm/s MV E/A ratio:  0.54 Ena Dawley MD Electronically signed by Ena Dawley MD Signature Date/Time: 04/05/2020/10:53:04 PM    Final    VAS [place] LOWER EXTREMITY VENOUS (DVT)  Result Date: 04/07/2020  Lower Venous DVTStudy Indications: Edema.  Risk Factors: None identified. Limitations: Prior L knee injury. Comparison Study: No prior studies. Performing Technologist: Oliver Hum RVT  Examination Guidelines: A complete evaluation includes B-mode imaging, spectral Doppler, color Doppler, and power Doppler as needed of all accessible portions of each vessel. Bilateral testing is considered an integral part of a complete examination. Limited examinations for reoccurring indications may be performed as noted. The reflux portion of the exam is performed with the patient in reverse Trendelenburg.  +---------+---------------+---------+-----------+----------+--------------+ RIGHT    CompressibilityPhasicitySpontaneityPropertiesThrombus Aging +---------+---------------+---------+-----------+----------+--------------+ CFV      Full           Yes      Yes                                 +---------+---------------+---------+-----------+----------+--------------+ SFJ      Full                                                        +---------+---------------+---------+-----------+----------+--------------+ FV Prox  Full                                                        +---------+---------------+---------+-----------+----------+--------------+ FV Mid   Full                                                        +---------+---------------+---------+-----------+----------+--------------+  FV DistalFull                                                        +---------+---------------+---------+-----------+----------+--------------+ PFV      Full                                                         +---------+---------------+---------+-----------+----------+--------------+ POP      Full           Yes      Yes                                 +---------+---------------+---------+-----------+----------+--------------+ PTV      Full                                                        +---------+---------------+---------+-----------+----------+--------------+ PERO     Full                                                        +---------+---------------+---------+-----------+----------+--------------+   +---------+---------------+---------+-----------+----------+--------------+ LEFT     CompressibilityPhasicitySpontaneityPropertiesThrombus Aging +---------+---------------+---------+-----------+----------+--------------+ CFV      Full           Yes      Yes                                 +---------+---------------+---------+-----------+----------+--------------+ SFJ      Full                                                        +---------+---------------+---------+-----------+----------+--------------+ FV Prox  Full                                                        +---------+---------------+---------+-----------+----------+--------------+ FV Mid   Full                                                        +---------+---------------+---------+-----------+----------+--------------+ FV DistalFull                                                        +---------+---------------+---------+-----------+----------+--------------+  PFV      Full                                                        +---------+---------------+---------+-----------+----------+--------------+ POP      Full           Yes      Yes                                 +---------+---------------+---------+-----------+----------+--------------+ PTV      Full                                                         +---------+---------------+---------+-----------+----------+--------------+ PERO     Full                                                        +---------+---------------+---------+-----------+----------+--------------+ 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 Per patient, image quality is suboptimal in the popliteal space likely due to a previous injury.    Summary: RIGHT: - There is no evidence of deep vein thrombosis in the lower extremity.  - No cystic structure found in the popliteal fossa.  LEFT: - There is no evidence of deep vein thrombosis in the lower extremity.  - No cystic structure found in the popliteal fossa. 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 Per patient, image quality is suboptimal in the popliteal space likely due to a previous injury.  *See table(s) above for measurements and observations. Electronically signed by Monica Martinez MD on 04/07/2020 at 3:09:57 PM.    Final     ASSESSMENT & PLAN:   79 yo with   1) Splenic arterial embolism causing infarction -04/04/2020 CT Abd/Pel ([id number]) revealed "1. Wedge-shaped splenic hypodensity, most compatible with infarct. 2. Wedge-shaped hypodensity superolateral left kidney. Wall focal pyelonephritis could give this appearance, infarct is more likely given the associated findings within the spleen. Multi-vessel infarct distribution may suggest an embolic source." -[id number] ECHO ([id number]) revealed "1. Left ventricular ejection fraction, by estimation, is 35 to 40%. The left ventricle has moderately decreased function. The left ventricle demonstrates global hypokinesis. Left ventricular diastolic parameters are consistent with Grade I diastolic dysfunction (impaired relaxation). 2. Right ventricular systolic function is normal. The right ventricular size is normal. 3. The mitral valve is normal in structure. No evidence of mitral valve regurgitation. No evidence of mitral stenosis.  4. The aortic valve is normal in structure. Aortic valve regurgitation is not visualized. No aortic stenosis is present.5. 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 -Discussed 04/16/2020 JAK2, MPL, CALR mutation studies which revealed "No Mutations Identified". -Discussed 04/16/2020 Beta-2-glycoprotein i abs, IgG/M/A is as follows:  Beta-2 Glyco I IgG at <9, Beta-2-Glycoprotein I IgM at <9, Beta-2-Glycoprotein I IgA at 9 -Discussed 04/16/2020 Cardiolipin antibodies, IgG, IgM, IgA is as follows: Anticardiolipin IgG at <9, Anticardiolipin IgM at <9, Anticardiolipin IgA at <9 -Discussed 04/16/2020 PTGENE is "Negative - No mutation identified." -Discussed 04/16/2020 F5LEID is "Negative - No mutation identified." -Discussed 04/16/2020 dRVVT Mix at 87.2 -Discussed 04/16/2020 dRVVT Confirm at 2.0 -Overall w/u is unrevealing  -Advised pt that Lupus anticoagulant may be falsely positive as he is currently on Xarelto. To confirm, would have to wait 3 months and repeat test after holding Xarelto for 48 hours. Advised pt that no matter the outcome of this test life-long blood thinners would still be recommended.   -Recommend pt continue long-term Xarelto due to no provoking events and extensive blood clots  -Continue to f/u with Dr. Damita Fernandez for ongoing anticoagulation management  -Recommend pt f/u with Nathan Fernandez as scheduled  -Refill Xarelto -Will see back as needed   FOLLOW UP: RTC with Dr. Irene Limbo as needed    The total time spent in the appt was 20 minutes and more than 50% was on counseling and direct patient cares.  All of the patient's questions were answered with apparent satisfaction. The patient knows to call the clinic with  any problems, questions or concerns.    Sullivan Lone MD [place] AAHIVMS [facility] Hematology/Oncology Physician [facility]  (Office):       [phone] (Work cell):  [phone] (Fax):           [phone]  04/30/2020 3:39 PM  I, Nathan Fernandez, am acting as a scribe for Dr. Sullivan Lone.   .I have reviewed the above documentation for accuracy and completeness, and I agree with the above. Brunetta Genera MD

## 2020-04-30 NOTE — Telephone Encounter (Signed)
  Left message for patient to call back. Will schedule him with PA when he calls. Will put in referral for EP.

## 2020-04-30 NOTE — Addendum Note (Signed)
Addended by: Aris Georgia, Tyreak Reagle L on: 04/30/2020 10:07 AM   Modules accepted: Orders

## 2020-04-30 NOTE — Telephone Encounter (Signed)
Pam- have him see PA this week for Echo with low EF and needs EP referral for ILR ASAP r/o PAF has had embolic event to kidneys/spleen

## 2020-05-02 ENCOUNTER — Ambulatory Visit: Payer: Medicare Other | Admitting: Physician Assistant

## 2020-05-02 ENCOUNTER — Telehealth: Payer: Self-pay | Admitting: Cardiovascular Disease

## 2020-05-02 ENCOUNTER — Other Ambulatory Visit: Payer: Self-pay

## 2020-05-02 ENCOUNTER — Encounter: Payer: Self-pay | Admitting: Physician Assistant

## 2020-05-02 VITALS — BP 122/90 | HR 82 | Ht 69.0 in | Wt 171.0 lb

## 2020-05-02 DIAGNOSIS — I2510 Atherosclerotic heart disease of native coronary artery without angina pectoris: Secondary | ICD-10-CM | POA: Diagnosis not present

## 2020-05-02 DIAGNOSIS — I10 Essential (primary) hypertension: Secondary | ICD-10-CM | POA: Diagnosis not present

## 2020-05-02 DIAGNOSIS — I5022 Chronic systolic (congestive) heart failure: Secondary | ICD-10-CM | POA: Diagnosis not present

## 2020-05-02 DIAGNOSIS — E785 Hyperlipidemia, unspecified: Secondary | ICD-10-CM

## 2020-05-02 DIAGNOSIS — D735 Infarction of spleen: Secondary | ICD-10-CM

## 2020-05-02 MED ORDER — HYDRALAZINE HCL 10 MG PO TABS: 10.0000 mg | ORAL_TABLET | Freq: Two times a day (BID) | ORAL | 11 refills | Status: DC

## 2020-05-02 NOTE — Telephone Encounter (Signed)
Dr. Johnsie Cancel wants patient to see PA this week. Called patient's wife (DPR) and made first available appointment with Richardson Dopp PA.

## 2020-05-02 NOTE — H&P (View-Only) (Signed)
Cardiology Office Note:    Date:  05/02/2020   ID:  Nathan Fernandez, DOB Mar 02, 1941, MRN [id number]  PCP:  Tonia Ghent, MD  Cardiologist:  Jenkins Rouge, MD  Electrophysiologist:  None   Referring MD: Tonia Ghent, MD   Chief Complaint:  Hospitalization Follow-up (splenic infarct)    Patient Profile:    Nathan Fernandez is a 79 y.o. 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  Cath 12/2018: Stable disease, medical therapy  Chronic systolic CHF  Echo 99991111: EF 35-40, GR 1 DD  History of angioedema with ACE inhibitor  Diabetes mellitus  Hypertension  Hyperlipidemia  COPD   Prior CV studies: Echocardiogram 04/05/2020 EF 35-40, GR 1 DD, normal RV SF  Cardiac catheterization 01/21/2019 LM ostial 30 LAD ostial 50, proximal 50 (aneurysmal disease), mid 41; D1 ostial 70 LCx ostial 30; OM2 40 RCA proximal 30, mid 50, distal 30 EF 35-45  Myoview 01/12/2019 EF 32, anterior, anteroseptal, apical septal scar, no ischemia  LHC 04/18/13:  Distal left main 40%, proximal LAD with several aneurysmal segments, proximal LAD 50% prior to and after aneurysmal segments, area does not appear to flow-limiting, proximal diagonal 70%, ostial circumflex 20%, proximal circumflex 20%, OM1 30-40%, proximal RCA 40%, mid RCA 30%, distal RCA 20%, EF 50%.  History of Present Illness:    [name] was last seen by Dr. Johnsie Cancel in February 2020.  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  CT was negative for pulmonary embolism.  There was no LV thrombus on echocardiogram.  EF remained stable at 35-40.  He was started on rivaroxaban.  Hematology/oncology recommended 6 months of therapy.  The patient's PCP recently reached out to Dr. Johnsie Cancel for cardiology follow-up in regards to his splenic and left renal infarct.  He was set up to see me for evaluation.  He is here today with his wife.  His flank  pain has completely resolved.  He has not had chest discomfort or significant shortness of breath.  He has not had syncope, orthopnea or leg swelling.  He has a chronic cough related to COPD.  Past Medical History:  Diagnosis Date  . 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   Hosp R facial weakness  . CAD (coronary artery disease)    MI, [place], PTCA; [place] 04/18/13: Distal left main 40%, proximal LAD with several aneurysmal segments, proximal LAD 50% prior to and after aneurysmal segments, area does not appear to flow-limiting, proximal diagonal 70%, ostial circumflex 20%, proximal circumflex 20%, OM1 30-40%, proximal RCA 40%, mid RCA 30%, distal RCA 20%, EF 50% => med Rx  . COPD (chronic obstructive pulmonary disease) ([place])   . DM2 (diabetes mellitus, type 2) ([place])   . History of ETT 1998   wnl  . History of MRI 08/04/09   brain- atrophy sm vess dz  . HLD (hyperlipidemia)   . HTN (hypertension)   . OA (osteoarthritis)     Current Medications: Current Meds  Medication Sig  . albuterol (VENTOLIN HFA) 108 (90 Base) MCG/ACT inhaler Inhale 1-2 puffs into the lungs every 6 (six) hours as needed for shortness of breath.  Marland Kitchen aspirin 81 MG EC tablet Take 81 mg by mouth daily.    Marland Kitchen buPROPion (WELLBUTRIN SR) 150 MG 12 hr tablet Take 1 tablet (150 mg total) by mouth in the morning.  . carvedilol (COREG) 6.25 MG tablet TAKE 1 TABLET  BY MOUTH TWICE A DAY  . cyclobenzaprine (FLEXERIL) 5 MG tablet Take 1 tablet (5 mg total) by mouth 3 (three) times daily as needed for muscle spasms.  . dorzolamide-timolol (COSOPT) 22.3-6.8 MG/ML ophthalmic solution Place 1 drop into both eyes 2 (two) times daily.  Marland Kitchen gemfibrozil (LOPID) 600 MG tablet TAKE 1 TABLET BY MOUTH TWICE A DAY  . isosorbide mononitrate (IMDUR) 30 MG 24 hr tablet TAKE 1/2 TABLET BY MOUTH DAILY.  Marland Kitchen ketoconazole (NIZORAL) 2 % cream Apply 1 application topically 2 (two) times daily as needed for irritation.  . meclizine (ANTIVERT) 12.5 MG tablet Take 1-2 tablets  (12.5-25 mg total) by mouth 3 (three) times daily as needed for dizziness.  Marland Kitchen MYRBETRIQ 50 MG TB24 tablet Take 50 mg by mouth daily.  . nitroGLYCERIN (NITROSTAT) 0.4 MG SL tablet Place 1 tablet (0.4 mg total) under the tongue every 5 (five) minutes as needed for chest pain. Up to 3 doses  . Polyethyl Glycol-Propyl Glycol (SYSTANE) 0.4-0.3 % GEL ophthalmic gel Place 1 application into the left eye at bedtime.  . pravastatin (PRAVACHOL) 40 MG tablet TAKE 1 TABLET BY MOUTH EVERYDAY AT BEDTIME  . rivaroxaban (XARELTO) 20 MG TABS tablet Take 1 tablet (20 mg total) by mouth daily with supper.  . tamsulosin (FLOMAX) 0.4 MG CAPS capsule Take 2 capsules (0.8 mg total) by mouth daily.  . traZODone (DESYREL) 100 MG tablet TAKE 2 TABLETS (200 MG TOTAL) BY MOUTH AT BEDTIME.     Allergies:   Ace inhibitors, Angiotensin receptor blockers, and Enalapril   Social History   Tobacco Use  . Smoking status: Former Smoker    Types: Cigars  . Smokeless tobacco: Never Used  . Tobacco comment: cigar occassionally  Substance Use Topics  . Alcohol use: Not Currently    Alcohol/week: 0.0 standard drinks  . Drug use: No     Family Hx: The patient's family history includes Aneurysm in his mother; Heart disease in his father and son; Hypertension in his father and mother; Stroke in his mother. There is no history of Colon cancer or Prostate cancer.  ROS   EKGs/Labs/Other Test Reviewed:    EKG:  EKG is not ordered today.  The ekg ordered today demonstrates n/a  Recent Labs: 04/16/2020: ALT 11; BUN 14; Creatinine 0.95; Hemoglobin 16.0; Platelets 329; Potassium 4.5; Sodium 138   Recent Lipid Panel Lab Results  Component Value Date/Time   CHOL 144 08/31/2019 08:56 AM   TRIG 320.0 (H) 08/31/2019 08:56 AM   HDL 28.80 (L) 08/31/2019 08:56 AM   CHOLHDL 5 08/31/2019 08:56 AM   LDLCALC 103 (H) 02/16/2017 11:30 AM   LDLDIRECT 81.0 08/31/2019 08:56 AM    Physical Exam:    VS:  BP 122/90   Pulse 82   Ht 5\' 9"   (1.753 m)   Wt 171 lb (77.6 kg)   SpO2 90%   BMI 25.25 kg/m     Wt Readings from Last 3 Encounters:  05/02/20 171 lb (77.6 kg)  04/16/20 170 lb 3.2 oz (77.2 kg)  04/12/20 170 lb 9 oz (77.4 kg)     Constitutional:      Appearance: Healthy appearance. Not in distress.  Neck:     Thyroid: No thyromegaly.     Vascular: JVD normal.  Pulmonary:     Effort: Pulmonary effort is normal.     Breath sounds: No wheezing. No rales.  Cardiovascular:     Normal rate. Regular rhythm. Normal S1. Normal S2.  Murmurs: There is no murmur.  Edema:    Peripheral edema absent.  Abdominal:     Palpations: Abdomen is soft. There is no hepatomegaly.  Skin:    General: Skin is warm and dry.  Neurological:     General: No focal deficit present.     Mental Status: Alert and oriented to person, place and time.     Cranial Nerves: Cranial nerves are intact.      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  There was no LV thrombus evident on transthoracic echocardiogram.  He had no DVT on venous Dopplers.  The most likely cause for his embolic event was atrial fibrillation.  The recommendation has been to continue Rivaroxaban for 6 months versus possibly long-term.  I reviewed his case today with Dr. Rayann Heman.  He also saw the patient.  We have recommended proceeding with an implantable loop recorder to assess for atrial fibrillation.  We have also recommended proceeding with a transesophageal echocardiogram to assess for other cardioembolic sources.  Risks and benefits of both procedures have been discussed with the patient today.  He agrees to proceed.  We will arrange his loop recorder implantation on the same day of his transesophageal echocardiogram.  2. Coronary artery disease involving native coronary artery of native heart without angina pectoris Status post remote myocardial  infarction in the 1990s.  Cardiac catheterization in January 2020 demonstrated aneurysmal, moderate nonobstructive disease in the LAD as well as moderate nonobstructive disease in the LCx and RCA.  He has been managed medically.  He is doing well without anginal symptoms.  Continue aspirin, carvedilol, isosorbide, pravastatin.  3. Chronic systolic CHF (congestive heart failure) ([place]) EF 35-40 by echocardiogram 03/2020.  Ischemic cardiomyopathy.  NYHA II-IIb.  Volume status overall is stable.  He cannot take ACE inhibitor/ARB or ARNI due to allergy to ACE inhibitor.  Continue current dose of carvedilol, isosorbide.  Start hydralazine 10 mg twice daily.  4. Essential hypertension Blood pressure somewhat borderline.  Start hydralazine as noted.  5. Hyperlipidemia, unspecified hyperlipidemia type Continue high intensity statin therapy.    Dispo:  Return in 6 weeks (on 06/14/2020) for Scheduled Follow Up w/ Dr. Johnsie Cancel .   Medication Adjustments/Labs and Tests Ordered: Current medicines are reviewed at length with the patient today.  Concerns regarding medicines are outlined above.  Tests Ordered: No orders of the defined types were placed in this encounter.  Medication Changes: Meds ordered this encounter  Medications  . hydrALAZINE (APRESOLINE) 10 MG tablet    Sig: Take 1 tablet (10 mg total) by mouth every 12 (twelve) hours.    Dispense:  60 tablet    Refill:  11    Order Specific Question:   Supervising Provider    Answer:   Lelon Perla [[id number]]    Signed, Richardson Dopp, PA-C  05/02/2020 4:42 PM    [facility] [address], [place], [place]  [address] Phone: [phone]; Fax: [phone]

## 2020-05-02 NOTE — Telephone Encounter (Signed)
Patient's wife called back. Richardson Dopp PA had an opening this afternoon. Informed her that Dr. Johnsie Cancel wanted patient to be seen this week by a PA. Wife agreed to appointment.

## 2020-05-02 NOTE — Progress Notes (Signed)
Cardiology Office Note:    Date:  05/02/2020   ID:  Nathan Fernandez, DOB 1941-08-05, MRN [id number]  PCP:  Tonia Ghent, MD  Cardiologist:  Nathan Rouge, MD  Electrophysiologist:  None   Referring MD: Tonia Ghent, MD   Chief Complaint:  Hospitalization Follow-up (splenic infarct)    Patient Profile:    Nathan Fernandez is a 79 y.o. 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  Cath 12/2018: Stable disease, medical therapy  Chronic systolic CHF  Echo 99991111: EF 35-40, GR 1 DD  History of angioedema with ACE inhibitor  Diabetes mellitus  Hypertension  Hyperlipidemia  COPD   Prior CV studies: Echocardiogram 04/05/2020 EF 35-40, GR 1 DD, normal RV SF  Cardiac catheterization 01/21/2019 LM ostial 30 LAD ostial 50, proximal 50 (aneurysmal disease), mid 13; D1 ostial 70 LCx ostial 30; OM2 40 RCA proximal 30, mid 50, distal 30 EF 35-45  Myoview 01/12/2019 EF 32, anterior, anteroseptal, apical septal scar, no ischemia  LHC 04/18/13:  Distal left main 40%, proximal LAD with several aneurysmal segments, proximal LAD 50% prior to and after aneurysmal segments, area does not appear to flow-limiting, proximal diagonal 70%, ostial circumflex 20%, proximal circumflex 20%, OM1 30-40%, proximal RCA 40%, mid RCA 30%, distal RCA 20%, EF 50%.  History of Present Illness:    Nathan Fernandez was last seen by Dr. Johnsie Cancel in February 2020.  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  CT was negative for pulmonary embolism.  There was no LV thrombus on echocardiogram.  EF remained stable at 35-40.  He was started on rivaroxaban.  Hematology/oncology recommended 6 months of therapy.  The patient's PCP recently reached out to Dr. Johnsie Cancel for cardiology follow-up in regards to his splenic and left renal infarct.  He was set up to see me for evaluation.  He is here today with his wife.  His flank  pain has completely resolved.  He has not had chest discomfort or significant shortness of breath.  He has not had syncope, orthopnea or leg swelling.  He has a chronic cough related to COPD.  Past Medical History:  Diagnosis Date  . 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   Hosp R facial weakness  . CAD (coronary artery disease)    MI, [place], PTCA; [place] 04/18/13: Distal left main 40%, proximal LAD with several aneurysmal segments, proximal LAD 50% prior to and after aneurysmal segments, area does not appear to flow-limiting, proximal diagonal 70%, ostial circumflex 20%, proximal circumflex 20%, OM1 30-40%, proximal RCA 40%, mid RCA 30%, distal RCA 20%, EF 50% => med Rx  . COPD (chronic obstructive pulmonary disease) ([place])   . DM2 (diabetes mellitus, type 2) ([place])   . History of ETT 1998   wnl  . History of MRI 08/04/09   brain- atrophy sm vess dz  . HLD (hyperlipidemia)   . HTN (hypertension)   . OA (osteoarthritis)     Current Medications: Current Meds  Medication Sig  . albuterol (VENTOLIN HFA) 108 (90 Base) MCG/ACT inhaler Inhale 1-2 puffs into the lungs every 6 (six) hours as needed for shortness of breath.  Marland Kitchen aspirin 81 MG EC tablet Take 81 mg by mouth daily.    Marland Kitchen buPROPion (WELLBUTRIN SR) 150 MG 12 hr tablet Take 1 tablet (150 mg total) by mouth in the morning.  . carvedilol (COREG) 6.25 MG tablet TAKE 1 TABLET  BY MOUTH TWICE A DAY  . cyclobenzaprine (FLEXERIL) 5 MG tablet Take 1 tablet (5 mg total) by mouth 3 (three) times daily as needed for muscle spasms.  . dorzolamide-timolol (COSOPT) 22.3-6.8 MG/ML ophthalmic solution Place 1 drop into both eyes 2 (two) times daily.  Marland Kitchen gemfibrozil (LOPID) 600 MG tablet TAKE 1 TABLET BY MOUTH TWICE A DAY  . isosorbide mononitrate (IMDUR) 30 MG 24 hr tablet TAKE 1/2 TABLET BY MOUTH DAILY.  Marland Kitchen ketoconazole (NIZORAL) 2 % cream Apply 1 application topically 2 (two) times daily as needed for irritation.  . meclizine (ANTIVERT) 12.5 MG tablet Take 1-2 tablets  (12.5-25 mg total) by mouth 3 (three) times daily as needed for dizziness.  Marland Kitchen MYRBETRIQ 50 MG TB24 tablet Take 50 mg by mouth daily.  . nitroGLYCERIN (NITROSTAT) 0.4 MG SL tablet Place 1 tablet (0.4 mg total) under the tongue every 5 (five) minutes as needed for chest pain. Up to 3 doses  . Polyethyl Glycol-Propyl Glycol (SYSTANE) 0.4-0.3 % GEL ophthalmic gel Place 1 application into the left eye at bedtime.  . pravastatin (PRAVACHOL) 40 MG tablet TAKE 1 TABLET BY MOUTH EVERYDAY AT BEDTIME  . rivaroxaban (XARELTO) 20 MG TABS tablet Take 1 tablet (20 mg total) by mouth daily with supper.  . tamsulosin (FLOMAX) 0.4 MG CAPS capsule Take 2 capsules (0.8 mg total) by mouth daily.  . traZODone (DESYREL) 100 MG tablet TAKE 2 TABLETS (200 MG TOTAL) BY MOUTH AT BEDTIME.     Allergies:   Ace inhibitors, Angiotensin receptor blockers, and Enalapril   Social History   Tobacco Use  . Smoking status: Former Smoker    Types: Cigars  . Smokeless tobacco: Never Used  . Tobacco comment: cigar occassionally  Substance Use Topics  . Alcohol use: Not Currently    Alcohol/week: 0.0 standard drinks  . Drug use: No     Family Hx: The patient's family history includes Aneurysm in his mother; Heart disease in his father and son; Hypertension in his father and mother; Stroke in his mother. There is no history of Colon cancer or Prostate cancer.  ROS   EKGs/Labs/Other Test Reviewed:    EKG:  EKG is not ordered today.  The ekg ordered today demonstrates n/a  Recent Labs: 04/16/2020: ALT 11; BUN 14; Creatinine 0.95; Hemoglobin 16.0; Platelets 329; Potassium 4.5; Sodium 138   Recent Lipid Panel Lab Results  Component Value Date/Time   CHOL 144 08/31/2019 08:56 AM   TRIG 320.0 (H) 08/31/2019 08:56 AM   HDL 28.80 (L) 08/31/2019 08:56 AM   CHOLHDL 5 08/31/2019 08:56 AM   LDLCALC 103 (H) 02/16/2017 11:30 AM   LDLDIRECT 81.0 08/31/2019 08:56 AM    Physical Exam:    VS:  BP 122/90   Pulse 82   Ht 5\' 9"   (1.753 m)   Wt 171 lb (77.6 kg)   SpO2 90%   BMI 25.25 kg/m     Wt Readings from Last 3 Encounters:  05/02/20 171 lb (77.6 kg)  04/16/20 170 lb 3.2 oz (77.2 kg)  04/12/20 170 lb 9 oz (77.4 kg)     Constitutional:      Appearance: Healthy appearance. Not in distress.  Neck:     Thyroid: No thyromegaly.     Vascular: JVD normal.  Pulmonary:     Effort: Pulmonary effort is normal.     Breath sounds: No wheezing. No rales.  Cardiovascular:     Normal rate. Regular rhythm. Normal S1. Normal S2.  Murmurs: There is no murmur.  Edema:    Peripheral edema absent.  Abdominal:     Palpations: Abdomen is soft. There is no hepatomegaly.  Skin:    General: Skin is warm and dry.  Neurological:     General: No focal deficit present.     Mental Status: Alert and oriented to person, place and time.     Cranial Nerves: Cranial nerves are intact.      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  There was no LV thrombus evident on transthoracic echocardiogram.  He had no DVT on venous Dopplers.  The most likely cause for his embolic event was atrial fibrillation.  The recommendation has been to continue Rivaroxaban for 6 months versus possibly long-term.  I reviewed his case today with Dr. Rayann Heman.  He also saw the patient.  We have recommended proceeding with an implantable loop recorder to assess for atrial fibrillation.  We have also recommended proceeding with a transesophageal echocardiogram to assess for other cardioembolic sources.  Risks and benefits of both procedures have been discussed with the patient today.  He agrees to proceed.  We will arrange his loop recorder implantation on the same day of his transesophageal echocardiogram.  2. Coronary artery disease involving native coronary artery of native heart without angina pectoris Status post remote myocardial  infarction in the 1990s.  Cardiac catheterization in January 2020 demonstrated aneurysmal, moderate nonobstructive disease in the LAD as well as moderate nonobstructive disease in the LCx and RCA.  He has been managed medically.  He is doing well without anginal symptoms.  Continue aspirin, carvedilol, isosorbide, pravastatin.  3. Chronic systolic CHF (congestive heart failure) ([place]) EF 35-40 by echocardiogram 03/2020.  Ischemic cardiomyopathy.  NYHA II-IIb.  Volume status overall is stable.  He cannot take ACE inhibitor/ARB or ARNI due to allergy to ACE inhibitor.  Continue current dose of carvedilol, isosorbide.  Start hydralazine 10 mg twice daily.  4. Essential hypertension Blood pressure somewhat borderline.  Start hydralazine as noted.  5. Hyperlipidemia, unspecified hyperlipidemia type Continue high intensity statin therapy.    Dispo:  Return in 6 weeks (on 06/14/2020) for Scheduled Follow Up w/ Dr. Johnsie Cancel .   Medication Adjustments/Labs and Tests Ordered: Current medicines are reviewed at length with the patient today.  Concerns regarding medicines are outlined above.  Tests Ordered: No orders of the defined types were placed in this encounter.  Medication Changes: Meds ordered this encounter  Medications  . hydrALAZINE (APRESOLINE) 10 MG tablet    Sig: Take 1 tablet (10 mg total) by mouth every 12 (twelve) hours.    Dispense:  60 tablet    Refill:  11    Order Specific Question:   Supervising Provider    Answer:   Lelon Perla [[id number]]    Signed, Richardson Dopp, PA-C  05/02/2020 4:42 PM    [facility] [place], [place], [place]  [address] Phone: [phone]; Fax: [phone]

## 2020-05-02 NOTE — Patient Instructions (Addendum)
Medication Instructions:   Your physician has recommended you make the following change in your medication:   1) Start Hydralazine 10 mg, 1 tablet by mouth twice a day  *If you need a refill on your cardiac medications before your next appointment, please call your pharmacy*  Lab Work:  None ordered today  Testing/Procedures:  Your Pre-procedure COVID-19 Testing will be done on  at [place] on 05/12/20 at 11:45AM. After your swab you will be given a mask to wear and instructed to go home and quarantine you are not allowed to have visitors until after your procedure. If you test positive you will be notified and your procedure will be cancelled.    Your physician has requested that you have a TEE. During a TEE, sound waves are used to create images of your heart. It provides your doctor with information about the size and shape of your heart and how well your heart's chambers and valves are working. In this test, a transducer is attached to the end of a flexible tube that's guided down your throat and into your esophagus (the tube leading from you mouth to your stomach) to get a more detailed image of your heart. You are not awake for the procedure. Please see the instruction sheet given to you today. For further information please visit [email or web address].  You are scheduled for a TEE on 05/15/20 with Dr. Debara Pickett.  Please arrive at the [facility] (Main Entrance A) at [facility]: [address] [place], [place] [address] at 9:00AM.   DIET: Nothing to eat or drink after midnight except a sip of water with medications (see medication instructions below)  Medication Instructions:  Continue your anticoagulant: Xarelto You will need to continue your anticoagulant after your procedure until you are told by your  Provider that it is safe to stop  You must have a responsible person to drive you home and stay in the waiting area during your procedure. Failure to do so could result  in cancellation.  Bring your insurance cards.  *Special Note: Every effort is made to have your procedure done on time. Occasionally there are emergencies that occur at the hospital that may cause delays. Please be patient if a delay does occur.   Follow-Up:  Keep appointment with Dr. Johnsie Cancel on 06/14/20

## 2020-05-02 NOTE — Telephone Encounter (Signed)
New message   Patient's wife states that she is returning Pam's call from yesterday. Please call.

## 2020-05-06 ENCOUNTER — Other Ambulatory Visit: Payer: Self-pay | Admitting: Cardiovascular Disease

## 2020-05-07 ENCOUNTER — Telehealth: Payer: Self-pay | Admitting: Cardiovascular Disease

## 2020-05-07 NOTE — Telephone Encounter (Signed)
New message   Called to schedule pt an appt with Dr. Curt Bears. Pt wife has questions about the procedure he is scheduled for on 5/25.

## 2020-05-07 NOTE — Telephone Encounter (Signed)
Patient's wife (DPR) is calling about having a consult with Dr. Curt Bears, when patient has already talked to Dr. Rayann Heman at his apointment with Richardson Dopp PA. Patient is set up for Loop recorder implant on 05/15/20 after TEE. Canceled consult with Dr. Curt Bears. Will send message to Dr. Jackalyn Lombard nurse so she can get follow up scheduled.

## 2020-05-12 ENCOUNTER — Other Ambulatory Visit (HOSPITAL_COMMUNITY)
Admission: RE | Admit: 2020-05-12 | Discharge: 2020-05-12 | Disposition: A | Discharge status: Home / Self Care | Payer: Medicare Other | Source: Ambulatory Visit | Attending: Internal Medicine | Admitting: Internal Medicine

## 2020-05-12 DIAGNOSIS — Z20822 Contact with and (suspected) exposure to covid-19: Secondary | ICD-10-CM | POA: Diagnosis not present

## 2020-05-12 DIAGNOSIS — Z01812 Encounter for preprocedural laboratory examination: Secondary | ICD-10-CM | POA: Insufficient documentation

## 2020-05-12 LAB — SARS CORONAVIRUS 2 (TAT 6-24 HRS): SARS Coronavirus 2: NEGATIVE

## 2020-05-15 ENCOUNTER — Ambulatory Visit (HOSPITAL_BASED_OUTPATIENT_CLINIC_OR_DEPARTMENT_OTHER): Payer: Medicare Other

## 2020-05-15 ENCOUNTER — Ambulatory Visit (HOSPITAL_COMMUNITY): Payer: Medicare Other | Admitting: Anesthesiology

## 2020-05-15 ENCOUNTER — Encounter (HOSPITAL_COMMUNITY)
Admission: RE | Disposition: A | Discharge status: Home / Self Care | Payer: Self-pay | Source: Ambulatory Visit | Attending: Internal Medicine

## 2020-05-15 ENCOUNTER — Encounter (HOSPITAL_COMMUNITY): Payer: Self-pay | Admitting: Internal Medicine

## 2020-05-15 ENCOUNTER — Ambulatory Visit (HOSPITAL_COMMUNITY)
Admission: RE | Admit: 2020-05-15 | Discharge: 2020-05-15 | Disposition: A | Discharge status: Home / Self Care | Payer: Medicare Other | Source: Ambulatory Visit | Attending: Internal Medicine | Admitting: Internal Medicine

## 2020-05-15 DIAGNOSIS — I4891 Unspecified atrial fibrillation: Secondary | ICD-10-CM | POA: Diagnosis not present

## 2020-05-15 DIAGNOSIS — D735 Infarction of spleen: Secondary | ICD-10-CM | POA: Diagnosis not present

## 2020-05-15 DIAGNOSIS — Z79899 Other long term (current) drug therapy: Secondary | ICD-10-CM | POA: Insufficient documentation

## 2020-05-15 DIAGNOSIS — I340 Nonrheumatic mitral (valve) insufficiency: Secondary | ICD-10-CM | POA: Diagnosis not present

## 2020-05-15 DIAGNOSIS — F17290 Nicotine dependence, other tobacco product, uncomplicated: Secondary | ICD-10-CM | POA: Diagnosis not present

## 2020-05-15 DIAGNOSIS — Z7982 Long term (current) use of aspirin: Secondary | ICD-10-CM | POA: Insufficient documentation

## 2020-05-15 DIAGNOSIS — I5022 Chronic systolic (congestive) heart failure: Secondary | ICD-10-CM

## 2020-05-15 DIAGNOSIS — J449 Chronic obstructive pulmonary disease, unspecified: Secondary | ICD-10-CM | POA: Insufficient documentation

## 2020-05-15 DIAGNOSIS — I2510 Atherosclerotic heart disease of native coronary artery without angina pectoris: Secondary | ICD-10-CM | POA: Insufficient documentation

## 2020-05-15 DIAGNOSIS — I110 Hypertensive heart disease with heart failure: Secondary | ICD-10-CM | POA: Insufficient documentation

## 2020-05-15 DIAGNOSIS — I252 Old myocardial infarction: Secondary | ICD-10-CM | POA: Diagnosis not present

## 2020-05-15 DIAGNOSIS — Z955 Presence of coronary angioplasty implant and graft: Secondary | ICD-10-CM | POA: Insufficient documentation

## 2020-05-15 DIAGNOSIS — I255 Ischemic cardiomyopathy: Secondary | ICD-10-CM | POA: Diagnosis not present

## 2020-05-15 DIAGNOSIS — E119 Type 2 diabetes mellitus without complications: Secondary | ICD-10-CM | POA: Insufficient documentation

## 2020-05-15 DIAGNOSIS — I700 Atherosclerosis of aorta: Secondary | ICD-10-CM | POA: Insufficient documentation

## 2020-05-15 DIAGNOSIS — I10 Essential (primary) hypertension: Secondary | ICD-10-CM | POA: Diagnosis not present

## 2020-05-15 DIAGNOSIS — Z7901 Long term (current) use of anticoagulants: Secondary | ICD-10-CM | POA: Insufficient documentation

## 2020-05-15 DIAGNOSIS — N280 Ischemia and infarction of kidney: Secondary | ICD-10-CM | POA: Diagnosis not present

## 2020-05-15 DIAGNOSIS — I749 Embolism and thrombosis of unspecified artery: Secondary | ICD-10-CM

## 2020-05-15 DIAGNOSIS — E785 Hyperlipidemia, unspecified: Secondary | ICD-10-CM | POA: Diagnosis not present

## 2020-05-15 DIAGNOSIS — I501 Left ventricular failure: Secondary | ICD-10-CM | POA: Diagnosis not present

## 2020-05-15 HISTORY — PX: LOOP RECORDER INSERTION: EP1214

## 2020-05-15 HISTORY — PX: BUBBLE STUDY: SHX6837

## 2020-05-15 HISTORY — PX: TEE WITHOUT CARDIOVERSION: SHX5443

## 2020-05-15 SURGERY — LOOP RECORDER INSERTION

## 2020-05-15 SURGERY — ECHOCARDIOGRAM, TRANSESOPHAGEAL
Anesthesia: Monitor Anesthesia Care

## 2020-05-15 MED ORDER — PROPOFOL 10 MG/ML IV BOLUS
INTRAVENOUS | Status: DC | PRN
  Administered 2020-05-15: 20 mg via INTRAVENOUS

## 2020-05-15 MED ORDER — BUTAMBEN-TETRACAINE-BENZOCAINE 2-2-14 % EX AERO
INHALATION_SPRAY | CUTANEOUS | Status: DC | PRN
  Administered 2020-05-15: 2 via TOPICAL

## 2020-05-15 MED ORDER — PROPOFOL 500 MG/50ML IV EMUL
INTRAVENOUS | Status: DC | PRN
Start: 2020-05-15 — End: 2020-05-15
  Administered 2020-05-15: 100 ug/kg/min via INTRAVENOUS

## 2020-05-15 MED ORDER — LIDOCAINE-EPINEPHRINE 1 %-1:100000 IJ SOLN
INTRAMUSCULAR | Status: AC
  Filled 2020-05-15: qty 1

## 2020-05-15 MED ORDER — SODIUM CHLORIDE 0.9 % IV SOLN: INTRAVENOUS | Status: DC

## 2020-05-15 MED ORDER — LIDOCAINE-EPINEPHRINE 1 %-1:100000 IJ SOLN
INTRAMUSCULAR | Status: DC | PRN
  Administered 2020-05-15: 30 mL

## 2020-05-15 SURGICAL SUPPLY — 2 items
MONITOR REVEAL LINQ II (Prosthesis & Implant Heart) ×2 IMPLANT
PACK LOOP INSERTION (CUSTOM PROCEDURE TRAY) ×3 IMPLANT

## 2020-05-15 NOTE — Discharge Instructions (Signed)

## 2020-05-15 NOTE — Interval H&P Note (Signed)
History and Physical Interval Note:  05/15/2020 10:42 AM  Nathan Fernandez  has presented today for surgery, with the diagnosis of afib.  The various methods of treatment have been discussed with the patient and family. After consideration of risks, benefits and other options for treatment, the patient has consented to  Procedure(s): LOOP RECORDER INSERTION (N/A) as a surgical intervention.  The patient's history has been reviewed, patient examined, no change in status, stable for surgery.  I have reviewed the patient's chart and labs.  Questions were answered to the patient's satisfaction.     The patient presents with systemic infarction felt to be due to a cardioembolic source.  TEE is reviewed and uneventful.  ILR is indicated at this time.   Risks, benefits, and alteratives to implantable loop recorder were discussed with the patient today.   At this time, the patient is very clear in their decision to proceed with implantable loop recorder.   Please call with questions.    Thompson Grayer

## 2020-05-15 NOTE — H&P (Signed)
   INTERVAL PROCEDURE H&P  History and Physical Interval Note:  05/15/2020 9:44 AM  Nathan Fernandez has presented today for their planned procedure. The various methods of treatment have been discussed with the patient and family. After consideration of risks, benefits and other options for treatment, the patient has consented to the procedure.  The patients' outpatient history has been reviewed, patient examined, and no change in status from most recent office note within the past 30 days. I have reviewed the patients' chart and labs and will proceed as planned. Questions were answered to the patient's satisfaction.   Pixie Casino, MD, [facility], [place] Director of the Advanced Lipid Disorders &  Cardiovascular Risk Reduction Clinic Diplomate of the American Board of Clinical Lipidology Attending Cardiologist  Direct Dial: [phone]  Fax: [phone]  Website:  [email or web address] [email or web address] Lainey Nelson 05/15/2020, 9:44 AM

## 2020-05-15 NOTE — CV Procedure (Signed)
TRANSESOPHAGEAL ECHOCARDIOGRAM (TEE) NOTE  INDICATIONS: source of embolism  PROCEDURE:   Informed consent was obtained prior to the procedure. The risks, benefits and alternatives for the procedure were discussed and the patient comprehended these risks.  Risks include, but are not limited to, cough, sore throat, vomiting, nausea, somnolence, esophageal and stomach trauma or perforation, bleeding, low blood pressure, aspiration, pneumonia, infection, trauma to the teeth and death.    After a procedural time-out, the patient was given propofol for moderate sedation.  The patient's heart rate, blood pressure, and oxygen saturation are monitored continuously during the procedure.The oropharynx was anesthetized with topical cetacaine.  The transesophageal probe was inserted in the esophagus and stomach without difficulty and multiple views were obtained.  The patient was kept under observation until the patient left the procedure room.  I was present face-to-face 100% of this time. The patient left the procedure room in stable condition.   Agitated microbubble saline contrast was administered.  COMPLICATIONS:    There were no immediate complications.  Findings:  1. LEFT VENTRICLE: The left ventricular wall thickness is normal.  The left ventricular cavity is normal in size. Wall motion is moderately globally hypokinetic.  LVEF is 40-45%.  2. RIGHT VENTRICLE:  The right ventricle is normal in structure and function without any thrombus or masses.    3. LEFT ATRIUM:  The left atrium is normal in size without any thrombus or masses.  There is not spontaneous echo contrast ("smoke") in the left atrium consistent with a low flow state.  4. LEFT ATRIAL APPENDAGE:  The left atrial appendage  is free of any thrombus or masses. The appendage has multiple lobes. Pulse doppler indicates moderate flow in the appendage.  5. ATRIAL SEPTUM:  The atrial septum appears intact and is free of thrombus and/or  masses.  There is no evidence for interatrial shunting by color doppler and saline microbubble.  6. RIGHT ATRIUM:  The right atrium is normal in size and function without any thrombus or masses.  7. MITRAL VALVE:  The mitral valve is normal in structure and function with trivial regurgitation.  There were no vegetations or stenosis.  8. AORTIC VALVE:  The aortic valve is trileaflet, normal in structure and function with no regurgitation.  There were no vegetations or stenosis  9. TRICUSPID VALVE:  The tricuspid valve is normal in structure and function with no regurgitation.  There were no vegetations or stenosis  10.  PULMONIC VALVE:  The pulmonic valve is normal in structure and function with no regurgitation.  There were no vegetations or stenosis.   11. AORTIC ARCH, ASCENDING AND DESCENDING AORTA:  There was grade 2 Ron Parker et. Al, 1992) atherosclerosis of the aortic arch and proximal descending aorta.  12. PULMONARY VEINS: Anomalous pulmonary venous return was not noted.  13. PERICARDIUM: The pericardium appeared normal and non-thickened.  There is no pericardial effusion.  IMPRESSION:   1. No LAA thrombus 2. Negative for PFO 3. No significant valve disease 4. Grade 2 aortic atherosclerosis 5. No LV apical thrombus 6. LVEF 40-45% with global hypokinesis.  RECOMMENDATIONS:    1.  Proceed with loop recorder implant as planned.  Time Spent Directly with the Patient:  45 minutes   Pixie Casino, MD, [facility], [place] Director of the Advanced Lipid Disorders &  Cardiovascular Risk Reduction Clinic Diplomate of the American Board of Clinical Lipidology Attending Cardiologist  Direct Dial: [phone]  Fax: [phone]  Website:  [email or web address]  Nadean Corwin Damacio Weisgerber 05/15/2020, 10:27 AM

## 2020-05-15 NOTE — Transfer of Care (Signed)
Immediate Anesthesia Transfer of Care Note  Patient: Nathan Fernandez  Procedure(s) Performed: TRANSESOPHAGEAL ECHOCARDIOGRAM (TEE) (N/A ) BUBBLE STUDY  Patient Location: Endoscopy Unit  Anesthesia Type:MAC  Level of Consciousness: awake, alert  and oriented  Airway & Oxygen Therapy: Patient Spontanous Breathing and Patient connected to nasal cannula oxygen  Post-op Assessment: Report given to RN, Post -op Vital signs reviewed and stable and Patient moving all extremities  Post vital signs: Reviewed and stable  Last Vitals:  Vitals Value Taken Time  BP 157/79 05/15/20 1059  Temp 36.6 C 05/15/20 1029  Pulse 73 05/15/20 1059  Resp 23 05/15/20 1059  SpO2 100 % 05/15/20 1059    Last Pain:  Vitals:   05/15/20 1052  TempSrc:   PainSc: 0-No pain         Complications: No apparent anesthesia complications

## 2020-05-15 NOTE — Anesthesia Postprocedure Evaluation (Signed)
Anesthesia Post Note  Patient: [name]  Procedure(s) Performed: TRANSESOPHAGEAL ECHOCARDIOGRAM (TEE) (N/A ) BUBBLE STUDY     Patient location during evaluation: Endoscopy Anesthesia Type: MAC Level of consciousness: awake and alert Pain management: pain level controlled Vital Signs Assessment: post-procedure vital signs reviewed and stable Respiratory status: spontaneous breathing, nonlabored ventilation and respiratory function stable Cardiovascular status: stable and blood pressure returned to baseline Postop Assessment: no apparent nausea or vomiting Anesthetic complications: no    Last Vitals:  Vitals:   05/15/20 1048 05/15/20 1059  BP: 131/81 (!) 157/79  Pulse: 66 73  Resp: (!) 24 (!) 23  Temp:    SpO2: 99% 100%    Last Pain:  Vitals:   05/15/20 1108  TempSrc:   PainSc: 0-No pain                 Kylen Schliep,W. EDMOND

## 2020-05-15 NOTE — Anesthesia Preprocedure Evaluation (Signed)
Anesthesia Evaluation  Patient identified by MRN, date of birth, ID band Patient awake    Reviewed: Allergy & Precautions, H&P , NPO status , Patient's Chart, lab work & pertinent test results  Airway Mallampati: II  TM Distance: >3 FB Neck ROM: Full    Dental no notable dental hx. (+) Partial Lower, Partial Upper, Dental Advisory Given   Pulmonary COPD, former smoker,    Pulmonary exam normal breath sounds clear to auscultation       Cardiovascular hypertension, Pt. on medications and Pt. on home beta blockers + CAD   Rhythm:Regular Rate:Normal     Neuro/Psych Depression negative neurological ROS     GI/Hepatic negative GI ROS, Neg liver ROS,   Endo/Other  diabetes  Renal/GU negative Renal ROS  negative genitourinary   Musculoskeletal  (+) Arthritis , Osteoarthritis,    Abdominal   Peds  Hematology negative hematology ROS (+)   Anesthesia Other Findings   Reproductive/Obstetrics negative OB ROS                             Anesthesia Physical Anesthesia Plan  ASA: III  Anesthesia Plan: MAC   Post-op Pain Management:    Induction: Intravenous  PONV Risk Score and Plan: 1 and Propofol infusion and Treatment may vary due to age or medical condition  Airway Management Planned: Nasal Cannula  Additional Equipment:   Intra-op Plan:   Post-operative Plan:   Informed Consent: I have reviewed the patients History and Physical, chart, labs and discussed the procedure including the risks, benefits and alternatives for the proposed anesthesia with the patient or authorized representative who has indicated his/her understanding and acceptance.     Dental advisory given  Plan Discussed with: CRNA  Anesthesia Plan Comments:         Anesthesia Quick Evaluation

## 2020-05-15 NOTE — Anesthesia Postprocedure Evaluation (Deleted)
Anesthesia Post Note  Patient: Nathan Fernandez  Procedure(s) Performed: TRANSESOPHAGEAL ECHOCARDIOGRAM (TEE) (N/A ) BUBBLE STUDY     Patient location during evaluation: Endoscopy Anesthesia Type: MAC Level of consciousness: awake and alert Pain management: pain level controlled Vital Signs Assessment: post-procedure vital signs reviewed and stable Respiratory status: spontaneous breathing, nonlabored ventilation and respiratory function stable Cardiovascular status: stable Postop Assessment: no apparent nausea or vomiting Anesthetic complications: no    Last Vitals:  Vitals:   05/15/20 0926  BP: (!) 139/106  Pulse: 72  Resp: 18  Temp: (!) 36.3 C  SpO2: 94%    Last Pain:  Vitals:   05/15/20 0926  TempSrc: Oral  PainSc: 0-No pain                 Graciella Arment,W. EDMOND

## 2020-05-15 NOTE — Anesthesia Procedure Notes (Signed)
Procedure Name: MAC Date/Time: 05/15/2020 10:05 AM Performed by: Amadeo Garnet, CRNA Pre-anesthesia Checklist: Patient identified, Emergency Drugs available, Suction available and Patient being monitored Patient Re-evaluated:Patient Re-evaluated prior to induction Oxygen Delivery Method: Nasal cannula Preoxygenation: Pre-oxygenation with 100% oxygen Induction Type: IV induction Placement Confirmation: positive ETCO2 Dental Injury: Teeth and Oropharynx as per pre-operative assessment

## 2020-05-24 ENCOUNTER — Other Ambulatory Visit: Payer: Self-pay | Admitting: Hematology

## 2020-05-24 DIAGNOSIS — Z7901 Long term (current) use of anticoagulants: Secondary | ICD-10-CM

## 2020-05-24 NOTE — Telephone Encounter (Signed)
Refill request

## 2020-05-27 DIAGNOSIS — Z7901 Long term (current) use of anticoagulants: Secondary | ICD-10-CM | POA: Insufficient documentation

## 2020-05-27 NOTE — Telephone Encounter (Signed)
We can address future refills for xarelto.

## 2020-05-29 ENCOUNTER — Other Ambulatory Visit: Payer: Self-pay

## 2020-05-29 ENCOUNTER — Ambulatory Visit (INDEPENDENT_AMBULATORY_CARE_PROVIDER_SITE_OTHER): Payer: Medicare Other | Admitting: Emergency Medicine

## 2020-05-29 DIAGNOSIS — I639 Cerebral infarction, unspecified: Secondary | ICD-10-CM

## 2020-05-29 LAB — CUP PACEART INCLINIC DEVICE CHECK
Date Time Interrogation Session: 20210608092556
Implantable Pulse Generator Implant Date: 20210525

## 2020-05-29 NOTE — Progress Notes (Signed)
ILR wound check in clinic. Steri strips removed prior to visit. Wound well healed. R-waves .12 mV. Home monitor transmitting nightly. No episodes. Questions answered.

## 2020-05-31 LAB — JAK2 (INCLUDING V617F AND EXON 12), MPL,& CALR-NEXT GEN SEQ

## 2020-06-05 ENCOUNTER — Institutional Professional Consult (permissible substitution): Payer: Medicare Other | Admitting: Cardiology

## 2020-06-12 NOTE — Progress Notes (Signed)
Cardiology Office Note:    Date:  06/14/2020   ID:  Nathan Fernandez, DOB [birth year], MRN [id number]  PCP:  Tonia Ghent, MD  Cardiologist:  Jenkins Rouge, MD  Electrophysiologist:  None   Referring MD: Tonia Ghent, MD   Chief Complaint:  No chief complaint on file.    Patient Profile:    Nathan Fernandez is a 79 y.o. 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  Cath 12/2018: Stable disease, medical therapy  Chronic systolic CHF  Echo 05/6439: EF 35-40, GR 1 DD  History of angioedema with ACE inhibitor  Diabetes mellitus  Hypertension  Hyperlipidemia  COPD   Prior CV studies: Echocardiogram 04/05/2020 EF 35-40, GR 1 DD, normal RV SF  Cardiac catheterization 01/21/2019 LM ostial 30 LAD ostial 50, proximal 50 (aneurysmal disease), mid 30; D1 ostial 70 LCx ostial 30; OM2 40 RCA proximal 30, mid 50, distal 30 EF 35-45  Myoview 01/12/2019 EF 32, anterior, anteroseptal, apical septal scar, no ischemia  LHC 04/18/13:  Distal left main 40%, proximal LAD with several aneurysmal segments, proximal LAD 50% prior to and after aneurysmal segments, area does not appear to flow-limiting, proximal diagonal 70%, ostial circumflex 20%, proximal circumflex 20%, OM1 30-40%, proximal RCA 40%, mid RCA 30%, distal RCA 20%, EF 50%.Rx with beta blocker and nitrates   History of Present Illness:    79 y.o. 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  CT was negative for pulmonary embolism.  There was no LV thrombus on echocardiogram.  EF remained stable at 35-40.  He was started on rivaroxaban.  Hematology/oncology recommended 6 months of therapy.  This would be through November unless ILR shows  PAF    TEE 05/15/20 Dr Debara Pickett EF 40-45% stable no LAA thrombus trivial MR negative bubble study Had ILR placed by Dr Rayann Heman same day [facility] 05/29/20 no PAF to date  He is doing well no cardiac complaints   .  Past Medical History:  Diagnosis Date  . 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   Hosp R facial weakness  . CAD (coronary artery disease)    MI, [place], PTCA; [place] 04/18/13: Distal left main 40%, proximal LAD with several aneurysmal segments, proximal LAD 50% prior to and after aneurysmal segments, area does not appear to flow-limiting, proximal diagonal 70%, ostial circumflex 20%, proximal circumflex 20%, OM1 30-40%, proximal RCA 40%, mid RCA 30%, distal RCA 20%, EF 50% => med Rx  . COPD (chronic obstructive pulmonary disease) ([place])   . DM2 (diabetes mellitus, type 2) ([place])   . History of ETT 1998   wnl  . History of MRI 08/04/09   brain- atrophy sm vess dz  . HLD (hyperlipidemia)   . HTN (hypertension)   . OA (osteoarthritis)     Current Medications: Current Meds  Medication Sig  . albuterol (VENTOLIN HFA) 108 (90 Base) MCG/ACT inhaler Inhale 1-2 puffs into the lungs every 6 (six) hours as needed for shortness of breath.  Marland Kitchen aspirin 81 MG EC tablet Take 81 mg by mouth daily.    Marland Kitchen buPROPion (WELLBUTRIN SR) 150 MG 12 hr tablet Take 1 tablet (150 mg total) by mouth in the morning.  Marland Kitchen  carvedilol (COREG) 6.25 MG tablet TAKE 1 TABLET BY MOUTH TWICE A DAY (Patient taking differently: Take 3.125 mg by mouth in the morning and at bedtime. )  . cyclobenzaprine (FLEXERIL) 5 MG tablet Take 1 tablet (5 mg total) by mouth 3 (three) times daily as needed for muscle spasms.  . dorzolamide-timolol (COSOPT) 22.3-6.8 MG/ML ophthalmic solution Place 1 drop into both eyes 2 (two) times daily.  Marland Kitchen gemfibrozil (LOPID) 600 MG tablet TAKE 1 TABLET BY MOUTH TWICE A DAY (Patient taking differently: Take 600 mg by mouth in the morning and at bedtime. )  . hydrALAZINE (APRESOLINE) 10 MG tablet Take 1 tablet (10 mg total)  by mouth every 12 (twelve) hours.  . isosorbide mononitrate (IMDUR) 30 MG 24 hr tablet TAKE 1/2 TABLET BY MOUTH EVERY DAY (Patient taking differently: Take 15 mg by mouth every evening. )  . ketoconazole (NIZORAL) 2 % cream Apply 1 application topically 2 (two) times daily as needed for irritation.  . meclizine (ANTIVERT) 12.5 MG tablet Take 1-2 tablets (12.5-25 mg total) by mouth 3 (three) times daily as needed for dizziness.  Marland Kitchen MYRBETRIQ 50 MG TB24 tablet Take 50 mg by mouth every evening.   . nitroGLYCERIN (NITROSTAT) 0.4 MG SL tablet Place 1 tablet (0.4 mg total) under the tongue every 5 (five) minutes as needed for chest pain. Up to 3 doses  . Polyethyl Glycol-Propyl Glycol (SYSTANE) 0.4-0.3 % GEL ophthalmic gel Place 1 application into the left eye at bedtime.  . pravastatin (PRAVACHOL) 40 MG tablet TAKE 1 TABLET BY MOUTH EVERYDAY AT BEDTIME (Patient taking differently: Take 40 mg by mouth at bedtime. )  . tamsulosin (FLOMAX) 0.4 MG CAPS capsule Take 2 capsules (0.8 mg total) by mouth daily. (Patient taking differently: Take 0.4 mg by mouth in the morning and at bedtime. )  . traZODone (DESYREL) 100 MG tablet TAKE 2 TABLETS (200 MG TOTAL) BY MOUTH AT BEDTIME.  Marland Kitchen XARELTO 20 MG TABS tablet TAKE 1 TABLET (20 MG TOTAL) BY MOUTH DAILY WITH SUPPER.     Allergies:   Ace inhibitors, Angiotensin receptor blockers, and Enalapril   Social History   Tobacco Use  . Smoking status: Former Smoker    Types: Cigars  . Smokeless tobacco: Never Used  . Tobacco comment: cigar occassionally  Vaping Use  . Vaping Use: Never used  Substance Use Topics  . Alcohol use: Not Currently    Alcohol/week: 0.0 standard drinks  . Drug use: No     Family Hx: The patient's family history includes Aneurysm in his mother; Heart disease in his father and son; Hypertension in his father and mother; Stroke in his mother. There is no history of Colon cancer or Prostate cancer.  ROS   EKGs/Labs/Other Test Reviewed:      EKG:  06/14/20 SR rate 67 RBBB no acute changes   Recent Labs: 04/16/2020: ALT 11; BUN 14; Creatinine 0.95; Hemoglobin 16.0; Platelets 329; Potassium 4.5; Sodium 138   Recent Lipid Panel Lab Results  Component Value Date/Time   CHOL 144 08/31/2019 08:56 AM   TRIG 320.0 (H) 08/31/2019 08:56 AM   HDL 28.80 (L) 08/31/2019 08:56 AM   CHOLHDL 5 08/31/2019 08:56 AM   LDLCALC 103 (H) 02/16/2017 11:30 AM   LDLDIRECT 81.0 08/31/2019 08:56 AM    Physical Exam:    VS:  BP 132/88   Pulse 64   Ht 5\' 9"  (1.753 m)   Wt 172 lb (78 kg)   SpO2 97%   BMI  25.40 kg/m     Wt Readings from Last 3 Encounters:  06/14/20 172 lb (78 kg)  05/15/20 171 lb (77.6 kg)  05/02/20 171 lb (77.6 kg)     Constitutional:      Appearance: Healthy appearance. Not in distress.  Neck:     Thyroid: No thyromegaly.     Vascular: JVD normal.  Pulmonary:     Effort: Pulmonary effort is normal.     Breath sounds: No wheezing. No rales.  Cardiovascular:     Normal rate. Regular rhythm. Normal S1. Normal S2.     Murmurs: There is no murmur.  Edema:    Peripheral edema absent.  Abdominal:     Palpations: Abdomen is soft. There is no hepatomegaly.  Skin:    General: Skin is warm and dry.  Neurological:     General: No focal deficit present.     Mental Status: Alert and oriented to person, place and time.     Cranial Nerves: Cranial nerves are intact.      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 Coronary artery disease involving native coronary artery of native heart without angina pectoris Status post remote myocardial infarction in the 1990s.  Cardiac catheterization in January 2020 demonstrated aneurysmal, moderate nonobstructive disease in the LAD as well as moderate nonobstructive disease in the LCx and RCA.  He has been managed  medically.  He is doing well without anginal symptoms.  Continue aspirin, carvedilol, isosorbide, pravastatin.  3. Chronic systolic CHF (congestive heart failure) (HCC) EF 40-45% by TEE 05/14/20   He cannot take ACE inhibitor/ARB or ARNI due to allergy to ACE inhibitor.  Continue current dose of carvedilol, isosorbide, and hydralazine   4. Essential hypertension Well controlled.  Continue current medications and low sodium Dash type diet.    5. Hyperlipidemia, unspecified hyperlipidemia type Continue high intensity statin therapy.    Dispo:  No follow-ups on file.   Medication Adjustments/Labs and Tests Ordered: Current medicines are reviewed at length with the patient today.  Concerns regarding medicines are outlined above.  Tests Ordered: No orders of the defined types were placed in this encounter.  Medication Changes: No orders of the defined types were placed in this encounter.   Signed, Jenkins Rouge, MD  06/14/2020 11:20 AM    [facility] [address], [place], [place]  [address] Phone: [phone]; Fax: [phone]

## 2020-06-14 ENCOUNTER — Ambulatory Visit: Payer: Medicare Other | Admitting: Cardiovascular Disease

## 2020-06-14 ENCOUNTER — Other Ambulatory Visit: Payer: Self-pay

## 2020-06-14 ENCOUNTER — Encounter: Payer: Self-pay | Admitting: Cardiovascular Disease

## 2020-06-14 VITALS — BP 132/88 | HR 64 | Ht 69.0 in | Wt 172.0 lb

## 2020-06-14 DIAGNOSIS — I5022 Chronic systolic (congestive) heart failure: Secondary | ICD-10-CM | POA: Diagnosis not present

## 2020-06-14 DIAGNOSIS — I2510 Atherosclerotic heart disease of native coronary artery without angina pectoris: Secondary | ICD-10-CM | POA: Diagnosis not present

## 2020-06-14 DIAGNOSIS — D735 Infarction of spleen: Secondary | ICD-10-CM | POA: Diagnosis not present

## 2020-06-14 NOTE — Patient Instructions (Addendum)
Medication Instructions:  *If you need a refill on your cardiac medications before your next appointment, please call your pharmacy*  Lab Work: If you have labs (blood work) drawn today and your tests are completely normal, you will receive your results only by: Marland Kitchen MyChart Message (if you have MyChart) OR . A paper copy in the mail If you have any lab test that is abnormal or we need to change your treatment, we will call you to review the results.  Testing/Procedures: None ordered today.   Follow-Up: At [facility], you and your health needs are our priority.  As part of our continuing mission to provide you with exceptional heart care, we have created designated Provider Care Teams.  These Care Teams include your primary Cardiologist (physician) and Advanced Practice Providers (APPs -  Physician Assistants and Nurse Practitioners) who all work together to provide you with the care you need, when you need it.  We recommend signing up for the patient portal called "MyChart".  Sign up information is provided on this After Visit Summary.  MyChart is used to connect with patients for Virtual Visits (Telemedicine).  Patients are able to view lab/test results, encounter notes, upcoming appointments, etc.  Non-urgent messages can be sent to your provider as well.   To learn more about what you can do with MyChart, go to [email or web address].    Your next appointment:   5 month(s)  The format for your next appointment:   In Person  Provider:   You may see Jenkins Rouge, MD or one of the following Advanced Practice Providers on your designated Care Team:    Truitt Merle, NP  Cecilie Kicks, NP  Kathyrn Drown, NP

## 2020-06-18 ENCOUNTER — Telehealth: Payer: Self-pay | Admitting: Family Medicine

## 2020-06-18 ENCOUNTER — Ambulatory Visit (INDEPENDENT_AMBULATORY_CARE_PROVIDER_SITE_OTHER): Payer: Medicare Other | Admitting: *Deleted

## 2020-06-18 DIAGNOSIS — I639 Cerebral infarction, unspecified: Secondary | ICD-10-CM

## 2020-06-18 LAB — CUP PACEART REMOTE DEVICE CHECK
Date Time Interrogation Session: 20210628001113
Implantable Pulse Generator Implant Date: 20210525

## 2020-06-18 NOTE — Telephone Encounter (Signed)
Wife [name] called and left a voicemail for Dr Damita Dunnings to call back today.  Pt is having a Dental procedure tomorrow morning 06/19/20 and was advised to contact his PCP prior to the procedure to stop his Xarelto and ASA 81 -- needs recommendations on stopping and restarting following the procedure.  Requesting a call back tonight as his procedure is tomorrow morning.   Please advise, thanks.

## 2020-06-18 NOTE — Telephone Encounter (Signed)
[name] advised that Dr. Damita Dunnings says that if the dentist feels comfortable doing the procedure with the patient on the medication, it should be fine.  If the dentist is not comfortable with it, we need more information and more time to adjust medication.  Wife [name]) says that he has a tooth broke off and they are wanting to pull it and the tooth beside it for a partial plate.

## 2020-06-19 NOTE — Telephone Encounter (Signed)
Dr. Ferdinand Cava what point could the patient reasonably in temporarily hold Xarelto for a dental procedure?  If he does have to hold Xarelto for the procedure would you recommend 3 days prior to the procedure?

## 2020-06-19 NOTE — Progress Notes (Signed)
Carelink Summary Report / Loop Recorder 

## 2020-06-20 ENCOUNTER — Telehealth: Payer: Self-pay | Admitting: *Deleted

## 2020-06-20 NOTE — Telephone Encounter (Signed)
Late entry for 6/29 @ 1330: Received Telephone Advice fax from after hours [facility] 06/19/20 at 1300 - date/time of call 6/28/214:46 on form. Per faxed message - patient is scheduled for tooth extraction with Dr. Myna Bright. He is on blood thinner and needs approval. Contacted patient, spoke with wife. She stated patient did not take  Xarelto last night (6/27) and had tooth extracted earlier in day - they had contacted PCP and had gone ahead and scheduled procedure. Per wife, the almanac said the signs indicated 6/29 was a good day for dental work to be done.  Wife states patient is doing well at this time and has not experienced any bleeding. Encouraged her/patient to contact office for any concerns or questions.

## 2020-06-20 NOTE — Telephone Encounter (Signed)
If necessary the Xarelto could be held 48h prior to dental procedure. Thx GK

## 2020-06-22 ENCOUNTER — Other Ambulatory Visit: Payer: Self-pay | Admitting: Hematology

## 2020-06-22 NOTE — Telephone Encounter (Signed)
Notify patient.  Okay to hold Xarelto 48 hours prior to the procedure if needed and restart the day after the procedure if not bleeding.  Form done for dental clinic.  Please send the form.  Thanks.

## 2020-06-22 NOTE — Telephone Encounter (Signed)
Left detailed message on voicemail. DPR.  Form faxed.

## 2020-06-30 ENCOUNTER — Other Ambulatory Visit: Payer: Self-pay | Admitting: Family Medicine

## 2020-07-02 NOTE — Telephone Encounter (Signed)
Refill request Pravastatin Last refill 10/06/19 #90/2 Upcoming appointment 09/07/20 See drug warning with Gemfibrozil

## 2020-07-03 ENCOUNTER — Other Ambulatory Visit: Payer: Self-pay | Admitting: Family Medicine

## 2020-07-03 NOTE — Telephone Encounter (Signed)
Sent. Thanks.   

## 2020-07-03 NOTE — Telephone Encounter (Signed)
Okay to continue.  Has tolerated.  Prescription sent.  Thanks.

## 2020-07-03 NOTE — Telephone Encounter (Signed)
Refill request Trazodone Last refill 03/28/20 #180 Last office visit 04/12/20

## 2020-07-19 ENCOUNTER — Other Ambulatory Visit: Payer: Self-pay | Admitting: Hematology

## 2020-07-23 ENCOUNTER — Ambulatory Visit (INDEPENDENT_AMBULATORY_CARE_PROVIDER_SITE_OTHER): Payer: Medicare Other | Admitting: *Deleted

## 2020-07-23 DIAGNOSIS — I639 Cerebral infarction, unspecified: Secondary | ICD-10-CM

## 2020-07-23 LAB — CUP PACEART REMOTE DEVICE CHECK
Date Time Interrogation Session: 20210731001005
Implantable Pulse Generator Implant Date: 20210525

## 2020-07-25 NOTE — Progress Notes (Signed)
Carelink Summary Report / Loop Recorder 

## 2020-08-17 DIAGNOSIS — H524 Presbyopia: Secondary | ICD-10-CM | POA: Diagnosis not present

## 2020-08-17 DIAGNOSIS — Z961 Presence of intraocular lens: Secondary | ICD-10-CM | POA: Diagnosis not present

## 2020-08-17 DIAGNOSIS — H40052 Ocular hypertension, left eye: Secondary | ICD-10-CM | POA: Diagnosis not present

## 2020-08-17 DIAGNOSIS — H02234 Paralytic lagophthalmos left upper eyelid: Secondary | ICD-10-CM | POA: Diagnosis not present

## 2020-08-23 ENCOUNTER — Ambulatory Visit (INDEPENDENT_AMBULATORY_CARE_PROVIDER_SITE_OTHER): Payer: Medicare Other | Admitting: *Deleted

## 2020-08-23 DIAGNOSIS — I639 Cerebral infarction, unspecified: Secondary | ICD-10-CM

## 2020-08-23 LAB — CUP PACEART REMOTE DEVICE CHECK
Date Time Interrogation Session: 20210902001555
Implantable Pulse Generator Implant Date: 20210525

## 2020-08-24 NOTE — Progress Notes (Signed)
Carelink Summary Report / Loop Recorder 

## 2020-08-25 ENCOUNTER — Other Ambulatory Visit: Payer: Self-pay | Admitting: Family Medicine

## 2020-08-29 ENCOUNTER — Other Ambulatory Visit: Payer: Self-pay | Admitting: Family Medicine

## 2020-08-29 ENCOUNTER — Other Ambulatory Visit: Payer: Self-pay | Admitting: Hematology

## 2020-08-29 DIAGNOSIS — E119 Type 2 diabetes mellitus without complications: Secondary | ICD-10-CM

## 2020-08-31 ENCOUNTER — Ambulatory Visit (INDEPENDENT_AMBULATORY_CARE_PROVIDER_SITE_OTHER): Payer: Medicare Other

## 2020-08-31 ENCOUNTER — Other Ambulatory Visit (INDEPENDENT_AMBULATORY_CARE_PROVIDER_SITE_OTHER): Payer: Medicare Other

## 2020-08-31 ENCOUNTER — Other Ambulatory Visit: Payer: Self-pay

## 2020-08-31 DIAGNOSIS — Z0000 Encounter for general adult medical examination without abnormal findings: Secondary | ICD-10-CM | POA: Diagnosis not present

## 2020-08-31 DIAGNOSIS — E119 Type 2 diabetes mellitus without complications: Secondary | ICD-10-CM | POA: Diagnosis not present

## 2020-08-31 LAB — URIC ACID: Uric Acid, Serum: 7.2 mg/dL (ref 4.0–7.8)

## 2020-08-31 LAB — CBC WITH DIFFERENTIAL/PLATELET
Basophils Absolute: 0.1 10*3/uL (ref 0.0–0.1)
Basophils Relative: 0.7 % (ref 0.0–3.0)
Eosinophils Absolute: 0.4 10*3/uL (ref 0.0–0.7)
Eosinophils Relative: 5.9 % — ABNORMAL HIGH (ref 0.0–5.0)
HCT: 43.7 % (ref 39.0–52.0)
Hemoglobin: 14.9 g/dL (ref 13.0–17.0)
Lymphocytes Relative: 30.7 % (ref 12.0–46.0)
Lymphs Abs: 2.3 10*3/uL (ref 0.7–4.0)
MCHC: 34.1 g/dL (ref 30.0–36.0)
MCV: 91.2 fl (ref 78.0–100.0)
Monocytes Absolute: 0.7 10*3/uL (ref 0.1–1.0)
Monocytes Relative: 10 % (ref 3.0–12.0)
Neutro Abs: 3.9 10*3/uL (ref 1.4–7.7)
Neutrophils Relative %: 52.7 % (ref 43.0–77.0)
Platelets: 230 10*3/uL (ref 150.0–400.0)
RBC: 4.79 Mil/uL (ref 4.22–5.81)
RDW: 14.2 % (ref 11.5–15.5)
WBC: 7.4 10*3/uL (ref 4.0–10.5)

## 2020-08-31 LAB — COMPREHENSIVE METABOLIC PANEL
ALT: 10 U/L (ref 0–53)
AST: 14 U/L (ref 0–37)
Albumin: 4 g/dL (ref 3.5–5.2)
Alkaline Phosphatase: 42 U/L (ref 39–117)
BUN: 16 mg/dL (ref 6–23)
CO2: 30 mEq/L (ref 19–32)
Calcium: 9.2 mg/dL (ref 8.4–10.5)
Chloride: 101 mEq/L (ref 96–112)
Creatinine, Ser: 0.97 mg/dL (ref 0.40–1.50)
GFR: 74.47 mL/min (ref >60.00)
Glucose, Bld: 196 mg/dL — ABNORMAL HIGH (ref 70–99)
Potassium: 4.3 mEq/L (ref 3.5–5.1)
Sodium: 136 mEq/L (ref 135–145)
Total Bilirubin: 0.6 mg/dL (ref 0.2–1.2)
Total Protein: 6.7 g/dL (ref 6.0–8.3)

## 2020-08-31 LAB — HEMOGLOBIN A1C: Hgb A1c MFr Bld: 7.3 % — ABNORMAL HIGH (ref 4.6–6.5)

## 2020-08-31 LAB — LDL CHOLESTEROL, DIRECT: Direct LDL: 64 mg/dL

## 2020-08-31 NOTE — Progress Notes (Signed)
Subjective:   Nathan Fernandez is a 79 y.o. male who presents for Medicare Annual/Subsequent preventive examination.  Review of Systems: N/A      I connected with the patient today by telephone and verified that I am speaking with the correct person using two identifiers. Location patient: home Location nurse: work Persons participating in the telephone visit: patient, nurse.   I discussed the limitations, risks, security and privacy concerns of performing an evaluation and management service by telephone and the availability of in person appointments. I also discussed with the patient that there may be a patient responsible charge related to this service. The patient expressed understanding and verbally consented to this telephonic visit.        Cardiac Risk Factors include: advanced age (>90men, >63 women);diabetes mellitus;male gender     Objective:    Today's Vitals   08/31/20 1114  PainSc: 7    There is no height or weight on file to calculate BMI.  Advanced Directives 08/31/2020 05/15/2020 04/16/2020 04/04/2020 08/30/2019 01/21/2019 08/24/2018  Does Patient Have a Medical Advance Directive? No Yes No No Yes Yes Yes  Type of Advance Directive - [place];Living will [place];Living will [place];Living will  Does patient want to make changes to medical advance directive? - - - - - No - Patient declined -  Copy of [place] in Chart? - No - copy requested - - No - copy requested No - copy requested No - copy requested  Would patient like information on creating a medical advance directive? No - Patient declined - No - Patient declined No - Patient declined - - -    Current Medications (verified) Outpatient Encounter Medications as of 08/31/2020  Medication Sig  . albuterol (VENTOLIN HFA) 108 (90 Base) MCG/ACT inhaler Inhale 1-2 puffs into the lungs every 6 (six) hours as  needed for shortness of breath.  Marland Kitchen aspirin 81 MG EC tablet Take 81 mg by mouth daily.    Marland Kitchen buPROPion (WELLBUTRIN SR) 150 MG 12 hr tablet TAKE 1 TABLET BY MOUTH TWICE A DAY  . carvedilol (COREG) 6.25 MG tablet TAKE 1 TABLET BY MOUTH TWICE A DAY (Patient taking differently: Take 3.125 mg by mouth in the morning and at bedtime. )  . cyclobenzaprine (FLEXERIL) 5 MG tablet Take 1 tablet (5 mg total) by mouth 3 (three) times daily as needed for muscle spasms.  . dorzolamide-timolol (COSOPT) 22.3-6.8 MG/ML ophthalmic solution Place 1 drop into both eyes 2 (two) times daily.  Marland Kitchen gemfibrozil (LOPID) 600 MG tablet TAKE 1 TABLET BY MOUTH TWICE A DAY (Patient taking differently: Take 600 mg by mouth in the morning and at bedtime. )  . hydrALAZINE (APRESOLINE) 10 MG tablet Take 1 tablet (10 mg total) by mouth every 12 (twelve) hours.  . isosorbide mononitrate (IMDUR) 30 MG 24 hr tablet TAKE 1/2 TABLET BY MOUTH EVERY DAY (Patient taking differently: Take 15 mg by mouth every evening. )  . ketoconazole (NIZORAL) 2 % cream Apply 1 application topically 2 (two) times daily as needed for irritation.  . meclizine (ANTIVERT) 12.5 MG tablet Take 1-2 tablets (12.5-25 mg total) by mouth 3 (three) times daily as needed for dizziness.  Marland Kitchen MYRBETRIQ 50 MG TB24 tablet Take 50 mg by mouth every evening.   . nitroGLYCERIN (NITROSTAT) 0.4 MG SL tablet Place 1 tablet (0.4 mg total) under the tongue every 5 (five) minutes as  needed for chest pain. Up to 3 doses  . Polyethyl Glycol-Propyl Glycol (SYSTANE) 0.4-0.3 % GEL ophthalmic gel Place 1 application into the left eye at bedtime.  . pravastatin (PRAVACHOL) 40 MG tablet TAKE 1 TABLET BY MOUTH EVERYDAY AT BEDTIME  . tamsulosin (FLOMAX) 0.4 MG CAPS capsule Take 2 capsules (0.8 mg total) by mouth daily. (Patient taking differently: Take 0.4 mg by mouth in the morning and at bedtime. )  . traZODone (DESYREL) 100 MG tablet TAKE 2 TABLETS (200 MG TOTAL) BY MOUTH AT BEDTIME.  Marland Kitchen XARELTO 20  MG TABS tablet TAKE 1 TABLET (20 MG TOTAL) BY MOUTH DAILY WITH SUPPER.   No facility-administered encounter medications on file as of 08/31/2020.    Allergies (verified) Ace inhibitors, Angiotensin receptor blockers, and Enalapril   History: Past Medical History:  Diagnosis Date  . 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   Hosp R facial weakness  . CAD (coronary artery disease)    MI, [employer], PTCA; [place] 04/18/13: Distal left main 40%, proximal LAD with several aneurysmal segments, proximal LAD 50% prior to and after aneurysmal segments, area does not appear to flow-limiting, proximal diagonal 70%, ostial circumflex 20%, proximal circumflex 20%, OM1 30-40%, proximal RCA 40%, mid RCA 30%, distal RCA 20%, EF 50% => med Rx  . COPD (chronic obstructive pulmonary disease) ([place])   . DM2 (diabetes mellitus, type 2) ([place])   . History of ETT 1998   wnl  . History of MRI 08/04/09   brain- atrophy sm vess dz  . HLD (hyperlipidemia)   . HTN (hypertension)   . OA (osteoarthritis)    Past Surgical History:  Procedure Laterality Date  . BUBBLE STUDY  05/15/2020   Procedure: BUBBLE STUDY;  Surgeon: Pixie Casino, MD;  Location: [facility] ENDOSCOPY;  Service: Cardiovascular;;  . cataract surgery  09/06   repair lens which moved  . EYE SURGERY    . LEFT HEART CATH AND CORONARY ANGIOGRAPHY N/A 01/21/2019   Procedure: LEFT HEART CATH AND CORONARY ANGIOGRAPHY;  Surgeon: Burnell Blanks, MD;  Location: [place] CV LAB;  Service: Cardiovascular;  Laterality: N/A;  . LOOP RECORDER INSERTION N/A 05/15/2020   Procedure: LOOP RECORDER INSERTION;  Surgeon: Thompson Grayer, MD;  Location: [place] CV LAB;  Service: Cardiovascular;  Laterality: N/A;  . stress myoview  06/29/06   sm distal anteroseptal & apical infarct  . TEE WITHOUT CARDIOVERSION N/A 05/15/2020   Procedure: TRANSESOPHAGEAL ECHOCARDIOGRAM (TEE);  Surgeon: Pixie Casino, MD;  Location: [facility] ENDOSCOPY;  Service: Cardiovascular;  Laterality: N/A;  .  THYROIDECTOMY, PARTIAL  1967   B9 growth   Family History  Problem Relation Age of Onset  . Hypertension Mother   . Aneurysm Mother   . Stroke Mother   . Hypertension Father   . Heart disease Father        CAD  . Heart disease Son        MI  . Colon cancer Neg Hx   . Prostate cancer Neg Hx    Social History   Socioeconomic History  . Marital status: Married    Spouse name: Not on file  . Number of children: 4  . Years of education: Not on file  . Highest education level: Not on file  Occupational History  . 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 Occupation: retired  Tobacco Use  . Smoking status: Former Smoker    Types: Cigars  .  Smokeless tobacco: Never Used  . Tobacco comment: cigar occassionally  Vaping Use  . Vaping Use: Never used  Substance and Sexual Activity  . Alcohol use: Not Currently    Alcohol/week: 0.0 standard drinks  . Drug use: No  . Sexual activity: Not on file  Other Topics Concern  . 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 Difficulty of Paying Living Expenses: Not hard at all  Food Insecurity: No Food Insecurity  . Worried About Charity fundraiser in the Last Year: Never true  . Ran Out of Food in the Last Year: Never true  Transportation Needs: No Transportation Needs  . Lack of Transportation (Medical): No  . Lack of Transportation (Non-Medical): No  Physical Activity: Inactive  . Days of Exercise per Week: 0 days  . Minutes of Exercise per Session: 0 min  Stress: No Stress Concern Present  . Feeling of Stress : Not at all  Social Connections:   . Frequency of Communication with Friends and Family: Not on file  .  Frequency of Social Gatherings with Friends and Family: Not on file  . Attends Religious Services: Not on file  . Active Member of Clubs or Organizations: Not on file  . Attends Archivist Meetings: Not on file  . Marital Status: Not on file    Tobacco Counseling Counseling given: Not Answered Comment: cigar occassionally   Clinical Intake:  Pre-visit preparation completed: Yes  Pain : 0-10 Pain Score: 7  Pain Type: Chronic pain Pain Location: Back Pain Orientation: Lower Pain Descriptors / Indicators: Aching, Sharp Pain Onset: 1 to 4 weeks ago Pain Frequency: Intermittent Pain Relieving Factors: tylenol, muscle relaxers  Pain Relieving Factors: tylenol, muscle relaxers  Nutritional Risks: None Diabetes: Yes CBG done?: No Did pt. bring in CBG monitor from home?: No  How often do you need to have someone help you when you read instructions, pamphlets, or other written materials from your doctor or pharmacy?: 1 - Never What is the last grade level you completed in school?: 2 years of college  Diabetic: Yes Nutrition Risk Assessment:  Has the patient had any N/V/D within the last 2 months?  No  Does the patient have any non-healing wounds?  No  Has the patient had any unintentional weight loss or weight gain?  No   Diabetes:  Is the patient diabetic?  Yes  If diabetic, was a CBG obtained today?  No  Did the patient bring in their glucometer from home?  No  How often do you monitor your CBG's? When needed.   Financial Strains and Diabetes Management:  Are you having any financial strains with the device, your supplies or your medication? No .  Does the patient want to be seen by Chronic Care Management for management of their diabetes?  No  Would the patient like to be referred to a Nutritionist or for Diabetic Management?  No   Diabetic Exams:  Diabetic Eye Exam: Completed 08/17/2020 Diabetic Foot Exam: Completed 09/06/2019   Interpreter Needed?:  No  Information entered by :: CJohnson, LPN   Activities of Daily Living In your present state of health, do you have any difficulty performing the following activities: 08/31/2020 04/05/2020  Hearing? Y N  Comment some hearing loss noted -  Vision? N N  Difficulty concentrating or making decisions? N N  Walking or climbing stairs? N N  Dressing or bathing? N N  Doing errands, shopping? N N  Preparing Food and eating ? N -  Using the Toilet? N -  In the past six months, have you accidently leaked urine? N -  Do you have problems with loss of bowel control? N -  Managing your Medications? N -  Managing your Finances? N -  Housekeeping or managing your Housekeeping? N -  Some recent data might be hidden    Patient Care Team: Tonia Ghent, MD as PCP - General (Family Medicine) Josue Hector, MD as PCP - Cardiology (Cardiology) Josue Hector, MD as Consulting Physician (Cardiology)  Indicate any recent Medical Services you may have received from other than [facility] providers in the past year (date may be approximate).     Assessment:   This is a routine wellness examination for Nathan Fernandez.  Hearing/Vision screen  Hearing Screening   125Hz  250Hz  500Hz  1000Hz  2000Hz  3000Hz  4000Hz  6000Hz  8000Hz   Right ear:           Left ear:           Vision Screening Comments: Patient gets annual eye exams   Dietary issues and exercise activities discussed: Current Exercise Habits: The patient does not participate in regular exercise at present, Exercise limited by: None identified  Goals    . Eat more fruits and vegetables     Starting 08/24/2018, I will continue to decrease intake of starchy foods and increase intake of fresh fruits and vegetables to 5 servings day.    . Patient Stated     08/30/2019, no goals    . Patient Stated     08/31/2020, I will maintain and continue medications as prescribed.       Depression Screen PHQ 2/9 Scores 08/31/2020 08/30/2019 08/24/2018 02/16/2017 02/08/2016  04/17/2014  PHQ - 2 Score 0 0 0 0 0 2  PHQ- 9 Score 0 0 0 - - -    Fall Risk Fall Risk  08/31/2020 08/30/2019 08/24/2018 02/16/2017 02/08/2016  Falls in the past year? 0 0 No No No  Number falls in past yr: 0 - - - -  Injury with Fall? 0 - - - -  Risk for fall due to : Medication side effect;Impaired balance/gait Medication side effect - - -  Follow up Falls evaluation completed;Falls prevention discussed Falls evaluation completed;Falls prevention discussed - - -    Any stairs in or around the home? Yes  If so, are there any without handrails? No  Home free of loose throw rugs in walkways, pet beds, electrical cords, etc? Yes  Adequate lighting in your home to reduce risk of falls? Yes   ASSISTIVE DEVICES UTILIZED TO PREVENT FALLS:  Life alert? No  Use of a cane, walker or w/c? No  Grab bars in the bathroom? No  Shower chair or bench in shower? No  Elevated toilet seat or a handicapped toilet? No   TIMED UP AND GO:  Was the test performed? N/A, telephonic visit .    Cognitive Function: MMSE - Mini Mental State Exam 08/31/2020 08/30/2019 08/24/2018 02/16/2017 02/08/2016  Orientation to time 5 5 5 5 5   Orientation to Place 5 5 5 5 5   Registration 3 3 3 3 3   Attention/ Calculation 5 5 0 0 5  Recall 3 3  3 1 3   Recall-comments - - - pt was unable to recall 2 of 3 words -  Language- name 2 objects - 0 0 0 -  Language- repeat 1 1 1 1 1   Language- follow 3 step command - 0 3 3 3   Language- read & follow direction - 0 0 0 1  Write a sentence - 0 0 0 -  Copy design - 0 0 0 -  Total score - 22 20 18  -  Mini Cog  Mini-Cog screen was completed. Maximum score is 22. A value of 0 denotes this part of the MMSE was not completed or the patient failed this part of the Mini-Cog screening.       Immunizations Immunization History  Administered Date(s) Administered  . Influenza Split 01/07/2012  . Influenza,inj,Quad PF,6+ Mos 02/08/2016  . PFIZER SARS-COV-2 Vaccination 02/13/2020, 03/05/2020    . Pneumococcal Conjugate-13 02/08/2016  . Td 09/01/2001    TDAP status: declined Flu Vaccine status: Declined, Education has been provided regarding the importance of this vaccine but patient still declined. Advised may receive this vaccine at local pharmacy or Health Dept. Aware to provide a copy of the vaccination record if obtained from local pharmacy or Health Dept. Verbalized acceptance and understanding. Pneumococcal vaccine status: Declined,  Education has been provided regarding the importance of this vaccine but patient still declined. Advised may receive this vaccine at local pharmacy or Health Dept. Aware to provide a copy of the vaccination record if obtained from local pharmacy or Health Dept. Verbalized acceptance and understanding.  Covid-19 vaccine status: Completed vaccines  Qualifies for Shingles Vaccine? Yes   Zostavax completed No   Shingrix Completed?: No.    Education has been provided regarding the importance of this vaccine. Patient has been advised to call insurance company to determine out of pocket expense if they have not yet received this vaccine. Advised may also receive vaccine at local pharmacy or Health Dept. Verbalized acceptance and understanding.  Screening Tests Health Maintenance  Topic Date Due  . Hepatitis C Screening  Never done  . PNA vac Low Risk Adult (2 of 2 - PPSV23) 09/05/2020 (Originally 02/07/2017)  . INFLUENZA VACCINE  03/21/2021 (Originally 07/22/2020)  . TETANUS/TDAP  08/31/2021 (Originally 09/02/2011)  . FOOT EXAM  09/05/2020  . HEMOGLOBIN A1C  10/05/2020  . OPHTHALMOLOGY EXAM  08/17/2021  . COVID-19 Vaccine  Completed    Health Maintenance  Health Maintenance Due  Topic Date Due  . Hepatitis C Screening  Never done    Colorectal cancer screening: No longer required.   Lung Cancer Screening: (Low Dose CT Chest recommended if Age 68-80 years, 30 pack-year currently smoking OR have quit w/in 15 years.) does not qualify.     Additional Screening:  Hepatitis C Screening: does qualify; Completed due  Vision Screening: Recommended annual ophthalmology exams for early detection of glaucoma and other disorders of the eye. Is the patient up to date with their annual eye exam?  Yes  Who is the provider or what is the name of the office in which the patient attends annual eye exams? Mr. Ellie Lunch If pt is not established with a provider, would they like to be referred to a provider to establish care? No .   Dental Screening: Recommended annual dental exams for proper oral hygiene  Community Resource Referral / Chronic Care Management: CRR required this visit?  No   CCM required this visit?  No      Plan:  I have personally reviewed and noted the following in the patient's chart:   . Medical and social history . Use of alcohol, tobacco or illicit drugs  . Current medications and supplements . Functional ability and status . Nutritional status . Physical activity . Advanced directives . List of other physicians . Hospitalizations, surgeries, and ER visits in previous 12 months . Vitals . Screenings to include cognitive, depression, and falls . Referrals and appointments  In addition, I have reviewed and discussed with patient certain preventive protocols, quality metrics, and best practice recommendations. A written personalized care plan for preventive services as well as general preventive health recommendations were provided to patient.    Due to this being a telephonic visit, the after visit summary with patients personalized plan was offered to patient via mail or my-chart. Patient preferred to pick up at office at next visit.    [place]ar[place], [place], LPN   7/32/2025

## 2020-08-31 NOTE — Patient Instructions (Signed)
[name] , Thank you for taking time to come for your Medicare Wellness Visit. I appreciate your ongoing commitment to your health goals. Please review the following plan we discussed and let me know if I can assist you in the future.   Screening recommendations/referrals: Colonoscopy: no longer required Recommended yearly ophthalmology/optometry visit for glaucoma screening and checkup Recommended yearly dental visit for hygiene and checkup  Vaccinations: Influenza vaccine: declined Pneumococcal vaccine: declined Tdap vaccine: declined Shingles vaccine: due, check with your insurance regarding coverage if interested   Covid-19: Completed series  Advanced directives: Advance directive discussed with you today. Even though you declined this today please call our office should you change your mind and we can give you the proper paperwork for you to fill out.  Conditions/risks identified: Diabetes  Next appointment: Follow up in one year for your annual wellness visit.   Preventive Care 80 Years and Older, Male Preventive care refers to lifestyle choices and visits with your health care provider that can promote health and wellness. What does preventive care include?  A yearly physical exam. This is also called an annual well check.  Dental exams once or twice a year.  Routine eye exams. Ask your health care provider how often you should have your eyes checked.  Personal lifestyle choices, including:  Daily care of your teeth and gums.  Regular physical activity.  Eating a healthy diet.  Avoiding tobacco and drug use.  Limiting alcohol use.  Practicing safe sex.  Taking low doses of aspirin every day.  Taking vitamin and mineral supplements as recommended by your health care provider. What happens during an annual well check? The services and screenings done by your health care provider during your annual well check will depend on your age, overall health, lifestyle  risk factors, and family history of disease. Counseling  Your health care provider may ask you questions about your:  Alcohol use.  Tobacco use.  Drug use.  Emotional well-being.  Home and relationship well-being.  Sexual activity.  Eating habits.  History of falls.  Memory and ability to understand (cognition).  Work and work Statistician. Screening  You may have the following tests or measurements:  Height, weight, and BMI.  Blood pressure.  Lipid and cholesterol levels. These may be checked every 5 years, or more frequently if you are over 106 years old.  Skin check.  Lung cancer screening. You may have this screening every year starting at age 29 if you have a 30-pack-year history of smoking and currently smoke or have quit within the past 15 years.  Fecal occult blood test (FOBT) of the stool. You may have this test every year starting at age 93.  Flexible sigmoidoscopy or colonoscopy. You may have a sigmoidoscopy every 5 years or a colonoscopy every 10 years starting at age 21.  Prostate cancer screening. Recommendations will vary depending on your family history and other risks.  Hepatitis C blood test.  Hepatitis B blood test.  Sexually transmitted disease (STD) testing.  Diabetes screening. This is done by checking your blood sugar (glucose) after you have not eaten for a while (fasting). You may have this done every 1-3 years.  Abdominal aortic aneurysm (AAA) screening. You may need this if you are a current or former smoker.  Osteoporosis. You may be screened starting at age 21 if you are at high risk. Talk with your health care provider about your test results, treatment options, and if necessary, the need for more tests.  Vaccines  Your health care provider may recommend certain vaccines, such as:  Influenza vaccine. This is recommended every year.  Tetanus, diphtheria, and acellular pertussis (Tdap, Td) vaccine. You may need a Td booster every 10  years.  Zoster vaccine. You may need this after age 11.  Pneumococcal 13-valent conjugate (PCV13) vaccine. One dose is recommended after age 54.  Pneumococcal polysaccharide (PPSV23) vaccine. One dose is recommended after age 33. Talk to your health care provider about which screenings and vaccines you need and how often you need them. This information is not intended to replace advice given to you by your health care provider. Make sure you discuss any questions you have with your health care provider. Document Released: 01/04/2016 Document Revised: 08/27/2016 Document Reviewed: 10/09/2015 Elsevier Interactive Patient Education  2017 [employer] Prevention in the Home Falls can cause injuries. They can happen to people of all ages. There are many things you can do to make your home safe and to help prevent falls. What can I do on the outside of my home?  Regularly fix the edges of walkways and driveways and fix any cracks.  Remove anything that might make you trip as you walk through a door, such as a raised step or threshold.  Trim any bushes or trees on the path to your home.  Use bright outdoor lighting.  Clear any walking paths of anything that might make someone trip, such as rocks or tools.  Regularly check to see if handrails are loose or broken. Make sure that both sides of any steps have handrails.  Any raised decks and porches should have guardrails on the edges.  Have any leaves, snow, or ice cleared regularly.  Use sand or salt on walking paths during winter.  Clean up any spills in your garage right away. This includes oil or grease spills. What can I do in the bathroom?  Use night lights.  Install grab bars by the toilet and in the tub and shower. Do not use towel bars as grab bars.  Use non-skid mats or decals in the tub or shower.  If you need to sit down in the shower, use a plastic, non-slip stool.  Keep the floor dry. Clean up any water that  spills on the floor as soon as it happens.  Remove soap buildup in the tub or shower regularly.  Attach bath mats securely with double-sided non-slip rug tape.  Do not have throw rugs and other things on the floor that can make you trip. What can I do in the bedroom?  Use night lights.  Make sure that you have a light by your bed that is easy to reach.  Do not use any sheets or blankets that are too big for your bed. They should not hang down onto the floor.  Have a firm chair that has side arms. You can use this for support while you get dressed.  Do not have throw rugs and other things on the floor that can make you trip. What can I do in the kitchen?  Clean up any spills right away.  Avoid walking on wet floors.  Keep items that you use a lot in easy-to-reach places.  If you need to reach something above you, use a strong step stool that has a grab bar.  Keep electrical cords out of the way.  Do not use floor polish or wax that makes floors slippery. If you must use wax, use non-skid floor wax.  Do not have throw rugs and other things on the floor that can make you trip. What can I do with my stairs?  Do not leave any items on the stairs.  Make sure that there are handrails on both sides of the stairs and use them. Fix handrails that are broken or loose. Make sure that handrails are as long as the stairways.  Check any carpeting to make sure that it is firmly attached to the stairs. Fix any carpet that is loose or worn.  Avoid having throw rugs at the top or bottom of the stairs. If you do have throw rugs, attach them to the floor with carpet tape.  Make sure that you have a light switch at the top of the stairs and the bottom of the stairs. If you do not have them, ask someone to add them for you. What else can I do to help prevent falls?  Wear shoes that:  Do not have high heels.  Have rubber bottoms.  Are comfortable and fit you well.  Are closed at the  toe. Do not wear sandals.  If you use a stepladder:  Make sure that it is fully opened. Do not climb a closed stepladder.  Make sure that both sides of the stepladder are locked into place.  Ask someone to hold it for you, if possible.  Clearly mark and make sure that you can see:  Any grab bars or handrails.  First and last steps.  Where the edge of each step is.  Use tools that help you move around (mobility aids) if they are needed. These include:  Canes.  Walkers.  Scooters.  Crutches.  Turn on the lights when you go into a dark area. Replace any light bulbs as soon as they burn out.  Set up your furniture so you have a clear path. Avoid moving your furniture around.  If any of your floors are uneven, fix them.  If there are any pets around you, be aware of where they are.  Review your medicines with your doctor. Some medicines can make you feel dizzy. This can increase your chance of falling. Ask your doctor what other things that you can do to help prevent falls. This information is not intended to replace advice given to you by your health care provider. Make sure you discuss any questions you have with your health care provider. Document Released: 10/04/2009 Document Revised: 05/15/2016 Document Reviewed: 01/12/2015 Elsevier Interactive Patient Education  2017 [employer].

## 2020-08-31 NOTE — Progress Notes (Signed)
PCP notes:  Health Maintenance: Flu- declined Shingrix- declined   Abnormal Screenings: none   Patient concerns: Back pain onset about 3-4 weeks ago  Patient was unaware about the medications listed on his allergy list. Never knew he was allergic to those things. He would like to discuss this with the provider at his physical.    Nurse concerns: none   Next PCP appt.: 09/07/2020 @ 11 am

## 2020-09-03 DIAGNOSIS — H02115 Cicatricial ectropion of left lower eyelid: Secondary | ICD-10-CM | POA: Diagnosis not present

## 2020-09-03 DIAGNOSIS — H02235 Paralytic lagophthalmos left lower eyelid: Secondary | ICD-10-CM | POA: Diagnosis not present

## 2020-09-03 DIAGNOSIS — H04222 Epiphora due to insufficient drainage, left lacrimal gland: Secondary | ICD-10-CM | POA: Diagnosis not present

## 2020-09-07 ENCOUNTER — Encounter: Payer: Self-pay | Admitting: Family Medicine

## 2020-09-07 ENCOUNTER — Ambulatory Visit (INDEPENDENT_AMBULATORY_CARE_PROVIDER_SITE_OTHER): Payer: Medicare Other | Admitting: Family Medicine

## 2020-09-07 ENCOUNTER — Other Ambulatory Visit: Payer: Self-pay

## 2020-09-07 VITALS — BP 122/72 | HR 71 | Temp 97.0°F | Ht 69.0 in | Wt 169.2 lb

## 2020-09-07 DIAGNOSIS — E119 Type 2 diabetes mellitus without complications: Secondary | ICD-10-CM

## 2020-09-07 DIAGNOSIS — Z23 Encounter for immunization: Secondary | ICD-10-CM

## 2020-09-07 DIAGNOSIS — Z0000 Encounter for general adult medical examination without abnormal findings: Secondary | ICD-10-CM

## 2020-09-07 DIAGNOSIS — M545 Low back pain, unspecified: Secondary | ICD-10-CM

## 2020-09-07 DIAGNOSIS — F32A Depression, unspecified: Secondary | ICD-10-CM

## 2020-09-07 DIAGNOSIS — R0789 Other chest pain: Secondary | ICD-10-CM

## 2020-09-07 DIAGNOSIS — E7800 Pure hypercholesterolemia, unspecified: Secondary | ICD-10-CM

## 2020-09-07 DIAGNOSIS — F329 Major depressive disorder, single episode, unspecified: Secondary | ICD-10-CM

## 2020-09-07 DIAGNOSIS — D735 Infarction of spleen: Secondary | ICD-10-CM

## 2020-09-07 DIAGNOSIS — N401 Enlarged prostate with lower urinary tract symptoms: Secondary | ICD-10-CM

## 2020-09-07 DIAGNOSIS — I10 Essential (primary) hypertension: Secondary | ICD-10-CM

## 2020-09-07 DIAGNOSIS — N138 Other obstructive and reflux uropathy: Secondary | ICD-10-CM

## 2020-09-07 DIAGNOSIS — G4700 Insomnia, unspecified: Secondary | ICD-10-CM

## 2020-09-07 MED ORDER — GEMFIBROZIL 600 MG PO TABS
600.0000 mg | ORAL_TABLET | Freq: Two times a day (BID) | ORAL | Status: DC
Start: 2020-09-07 — End: 2020-12-26

## 2020-09-07 MED ORDER — BUPROPION HCL ER (SR) 150 MG PO TB12
150.0000 mg | ORAL_TABLET | Freq: Every day | ORAL | Status: DC
Start: 2020-09-07 — End: 2021-06-21

## 2020-09-07 MED ORDER — CARVEDILOL 6.25 MG PO TABS
3.1250 mg | ORAL_TABLET | Freq: Two times a day (BID) | ORAL | Status: DC
Start: 2020-09-07 — End: 2021-02-14

## 2020-09-07 NOTE — Progress Notes (Signed)
This visit occurred during the SARS-CoV-2 public health emergency.  Safety protocols were in place, including screening questions prior to the visit, additional usage of staff PPE, and extensive cleaning of exam room while observing appropriate contact time as indicated for disinfecting solutions.  L lower back pain.  Going on for about 2 weeks.  Reproducible with ROM, worse leaning forward.  Also with R shoulder pain.  Pain on ROM.  Right upper chest wall tender to palpation locally.  Elevated Cholesterol: Using medications without problems: yes Muscle aches: not likely from statin.   Diet compliance: encouraged.   Exercise: encouraged.   DM2.  No meds.  A1c clearly better.  Labs d/w pt.  He cut back on carbs.  No foot sx.   Hypertension:  Using medication without problems or lightheadedness: yes Chest pain with exertion:no Edema:no Short of breath:no  Still anticoagulated.  No bleeding.  Compliant.    Insomnia.  Still on trazodone.  It helps.  Mood is good.  No SI/HI.  wellbutrin helps.    Has been off mybetriq and urgency is manageable.  Discussed.  Shingles and tetanus d/w pt.  It may be cheaper at pharmacy.   covid 2021 Declined flu shot, d/w pt.  PNA 23 today.    PMH and SH reviewed  Meds, vitals, and allergies reviewed.   ROS: Per HPI unless specifically indicated in ROS section   GEN: nad, alert and oriented HEENT: ncat NECK: supple w/o LA CV: rrr. PULM: ctab, no inc wob ABD: soft, +bs EXT: no edema SKIN: no acute rash L lower back ttp w/o spasms.   Normal right shoulder range of motion on internal/external rotation.  No arm drop.  AC joint not tender to palpation. Right upper anterior chest wall tender to palpation locally.  Diabetic foot exam: Normal inspection No skin breakdown No calluses  Normal DP pulses Normal sensation to light touch and monofilament Nails thickened

## 2020-09-07 NOTE — Patient Instructions (Addendum)
Try using heat on your lower back, carefully.   Don't change your meds for now.   Take care.  Glad to see you. Don't stop your blood thinner yet.  Pneumonia shot today.   Recheck 4 months with labs at the visit.

## 2020-09-09 NOTE — Assessment & Plan Note (Signed)
Improved with Wellbutrin.  No suicidal or homicidal intent.  Continue as is.

## 2020-09-09 NOTE — Assessment & Plan Note (Signed)
Improved with trazodone.  Continue as is.  He agrees.

## 2020-09-09 NOTE — Assessment & Plan Note (Signed)
Would continue pravastatin.

## 2020-09-09 NOTE — Assessment & Plan Note (Signed)
A1c clearly better.  He cut back on carbohydrates.  No foot symptoms.  Continue as is.  Recheck periodically.  He agrees.

## 2020-09-09 NOTE — Assessment & Plan Note (Addendum)
He can try using heat and see if that helps.  He has an overall benign exam.  Able to bear weight.  Still okay for outpatient follow-up.  No new neurologic symptoms.

## 2020-09-09 NOTE — Assessment & Plan Note (Signed)
Has been off Myrbetriq and urgency is manageable.  He will update me as needed.

## 2020-09-09 NOTE — Assessment & Plan Note (Signed)
Benign exam.  Should resolve.

## 2020-09-09 NOTE — Assessment & Plan Note (Signed)
Shingles and tetanus d/w pt.  It may be cheaper at pharmacy.   covid 2021 Declined flu shot, d/w pt.  PNA 23 today.

## 2020-09-09 NOTE — Assessment & Plan Note (Signed)
Still anticoagulated.  No bleeding.  Compliant.  Would continue as is for now with Xarelto.  Rationale discussed with patient.  Hematology had recommended long-term anticoagulation previously.

## 2020-09-09 NOTE — Assessment & Plan Note (Signed)
Blood pressure is reasonable.  Continue carvedilol hydralazine isosorbide

## 2020-09-21 ENCOUNTER — Other Ambulatory Visit: Payer: Self-pay | Admitting: Hematology

## 2020-09-21 NOTE — Telephone Encounter (Signed)
Patient requesting refill. 

## 2020-09-25 ENCOUNTER — Ambulatory Visit (INDEPENDENT_AMBULATORY_CARE_PROVIDER_SITE_OTHER): Payer: Medicare Other

## 2020-09-25 DIAGNOSIS — I639 Cerebral infarction, unspecified: Secondary | ICD-10-CM | POA: Diagnosis not present

## 2020-09-25 LAB — CUP PACEART REMOTE DEVICE CHECK
Date Time Interrogation Session: 20211005001332
Implantable Pulse Generator Implant Date: 20210525

## 2020-09-28 NOTE — Progress Notes (Signed)
Carelink Summary Report / Loop Recorder 

## 2020-10-23 ENCOUNTER — Other Ambulatory Visit: Payer: Self-pay | Admitting: Hematology

## 2020-10-28 LAB — CUP PACEART REMOTE DEVICE CHECK
Date Time Interrogation Session: 20211107011335
Implantable Pulse Generator Implant Date: 20210525

## 2020-10-29 ENCOUNTER — Ambulatory Visit (INDEPENDENT_AMBULATORY_CARE_PROVIDER_SITE_OTHER): Payer: Medicare Other

## 2020-10-29 DIAGNOSIS — I639 Cerebral infarction, unspecified: Secondary | ICD-10-CM

## 2020-10-30 NOTE — Progress Notes (Signed)
Carelink Summary Report / Loop Recorder 

## 2020-11-01 ENCOUNTER — Telehealth: Payer: Self-pay | Admitting: Family Medicine

## 2020-11-01 NOTE — Progress Notes (Signed)
  Chronic Care Management   Outreach Note  11/01/2020 Name: Nathan Fernandez MRN: [id number] DOB: 06-29-1941  Referred by: Tonia Ghent, MD Reason for referral : Chronic Care Management   An unsuccessful telephone outreach was attempted today. The patient was referred to the pharmacist for assistance with care management and care coordination.   Follow Up Plan:   Hilario Quarry  Upstream Scheduler

## 2020-11-01 NOTE — Progress Notes (Signed)
Cardiology Office Note:    Date:  11/12/2020   ID:  ROBERTLEE ROGACKI, DOB 1941-09-06, MRN [id number]  PCP:  Tonia Ghent, MD  Cardiologist:  Jenkins Rouge, MD  Electrophysiologist:  None   Referring MD: Tonia Ghent, MD   Chief Complaint:  No chief complaint on file.    Patient Profile:    Nathan Fernandez is a 79 y.o. 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  Cath 12/2018: Stable disease, medical therapy  Chronic systolic CHF  Echo 08/9241: EF 35-40, GR 1 DD  History of angioedema with ACE inhibitor  Diabetes mellitus  Hypertension  Hyperlipidemia  COPD   Prior CV studies: Echocardiogram 04/05/2020 EF 35-40, GR 1 DD, normal RV SF  Cardiac catheterization 01/21/2019 LM ostial 30 LAD ostial 50, proximal 50 (aneurysmal disease), mid 30; D1 ostial 70 LCx ostial 30; OM2 40 RCA proximal 30, mid 50, distal 30 EF 35-45  Myoview 01/12/2019 EF 32, anterior, anteroseptal, apical septal scar, no ischemia  LHC 04/18/13:  Distal left main 40%, proximal LAD with several aneurysmal segments, proximal LAD 50% prior to and after aneurysmal segments, area does not appear to flow-limiting, proximal diagonal 70%, ostial circumflex 20%, proximal circumflex 20%, OM1 30-40%, proximal RCA 40%, mid RCA 30%, distal RCA 20%, EF 50%.Rx with beta blocker and nitrates   History of Present Illness:    79 y.o. 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  CT was negative for pulmonary embolism.  There was no LV thrombus on echocardiogram.  EF remained stable at 35-40.  He was started on rivaroxaban.  Dr Damita Dunnings primary and Dr Irene Limbo hematology have told him he should be on life long anticoagulation    TEE 05/15/20 Dr Debara Pickett EF 40-45% stable no LAA thrombus trivial MR negative bubble study Had ILR placed by Dr Rayann Heman same day PACEART 05/29/20 no PAF to date  He is doing well no cardiac complaints   Loves his [facility] lab sleeps with him .  Past Medical History:  Diagnosis Date  . 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   Hosp R facial weakness  . CAD (coronary artery disease)    MI, [place], PTCA; [place] 04/18/13: Distal left main 40%, proximal LAD with several aneurysmal segments, proximal LAD 50% prior to and after aneurysmal segments, area does not appear to flow-limiting, proximal diagonal 70%, ostial circumflex 20%, proximal circumflex 20%, OM1 30-40%, proximal RCA 40%, mid RCA 30%, distal RCA 20%, EF 50% => med Rx  . COPD (chronic obstructive pulmonary disease) ([place])   . DM2 (diabetes mellitus, type 2) ([place])   . History of ETT 1998   wnl  . History of MRI 08/04/09   brain- atrophy sm vess dz  . HLD (hyperlipidemia)   . HTN (hypertension)   . OA (osteoarthritis)     Current Medications: Current Meds  Medication Sig  . albuterol (VENTOLIN HFA) 108 (90 Base) MCG/ACT inhaler Inhale 1-2 puffs into the lungs every 6 (six) hours as needed for shortness of breath.  Marland Kitchen aspirin 81 MG EC tablet Take 81 mg by mouth daily.    Marland Kitchen buPROPion (WELLBUTRIN SR) 150 MG 12 hr tablet Take 1 tablet (150 mg  total) by mouth daily.  . carvedilol (COREG) 6.25 MG tablet Take 0.5 tablets (3.125 mg total) by mouth in the morning and at bedtime.  . cyclobenzaprine (FLEXERIL) 5 MG tablet Take 1 tablet (5 mg total) by mouth 3 (three) times daily as needed for muscle spasms.  . dorzolamide-timolol (COSOPT) 22.3-6.8 MG/ML ophthalmic solution Place 1 drop into both eyes 2 (two) times daily.  Marland Kitchen gemfibrozil (LOPID) 600 MG tablet Take 1 tablet (600 mg total) by mouth in the morning and at bedtime.  . hydrALAZINE (APRESOLINE) 10 MG tablet Take 1 tablet (10 mg total) by mouth every 12 (twelve) hours.  Marland Kitchen ibuprofen (ADVIL) 800 MG tablet  Take 800 mg by mouth daily.  . isosorbide mononitrate (IMDUR) 30 MG 24 hr tablet TAKE 1/2 TABLET BY MOUTH EVERY DAY  . ketoconazole (NIZORAL) 2 % cream Apply 1 application topically 2 (two) times daily as needed for irritation.  . meclizine (ANTIVERT) 12.5 MG tablet Take 1-2 tablets (12.5-25 mg total) by mouth 3 (three) times daily as needed for dizziness.  . nitroGLYCERIN (NITROSTAT) 0.4 MG SL tablet Place 1 tablet (0.4 mg total) under the tongue every 5 (five) minutes as needed for chest pain. Up to 3 doses  . Polyethyl Glycol-Propyl Glycol (SYSTANE) 0.4-0.3 % GEL ophthalmic gel Place 1 application into the left eye at bedtime.  . pravastatin (PRAVACHOL) 40 MG tablet TAKE 1 TABLET BY MOUTH EVERYDAY AT BEDTIME  . traZODone (DESYREL) 100 MG tablet TAKE 2 TABLETS (200 MG TOTAL) BY MOUTH AT BEDTIME.  Marland Kitchen XARELTO 20 MG TABS tablet TAKE 1 TABLET (20 MG TOTAL) BY MOUTH DAILY WITH SUPPER.     Allergies:   Ace inhibitors, Angiotensin receptor blockers, and Enalapril   Social History   Tobacco Use  . Smoking status: Former Smoker    Types: Cigars  . Smokeless tobacco: Never Used  . Tobacco comment: cigar occassionally  Vaping Use  . Vaping Use: Never used  Substance Use Topics  . Alcohol use: Not Currently    Alcohol/week: 0.0 standard drinks  . Drug use: No     Family Hx: The patient's family history includes Aneurysm in his mother; Heart disease in his father and son; Hypertension in his father and mother; Stroke in his mother. There is no history of Colon cancer or Prostate cancer.  ROS   EKGs/Labs/Other Test Reviewed:    EKG:  06/14/20 SR rate 67 RBBB no acute changes   Recent Labs: 08/31/2020: ALT 10; BUN 16; Creatinine, Ser 0.97; Hemoglobin 14.9; Platelets 230.0; Potassium 4.3; Sodium 136   Recent Lipid Panel Lab Results  Component Value Date/Time   CHOL 144 08/31/2019 08:56 AM   TRIG 320.0 (H) 08/31/2019 08:56 AM   HDL 28.80 (L) 08/31/2019 08:56 AM   CHOLHDL 5 08/31/2019  08:56 AM   LDLCALC 103 (H) 02/16/2017 11:30 AM   LDLDIRECT 64.0 08/31/2020 09:25 AM    Physical Exam:    VS:  BP 116/78   Pulse 78   Ht 5\' 9"  (1.753 m)   Wt 77.1 kg   SpO2 94%   BMI 25.10 kg/m     Wt Readings from Last 3 Encounters:  11/12/20 77.1 kg  09/07/20 76.8 kg  06/14/20 78 kg    Affect appropriate Healthy:  appears stated age 4: normal Neck supple with no adenopathy JVP normal no bruits no thyromegaly Lungs clear with no wheezing and good diaphragmatic motion Heart:  S1/S2 no murmur, no rub, gallop or click PMI normal Abdomen: benighn,  BS positve, no tenderness, no AAA no bruit.  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 Coronary artery disease involving native coronary artery of native heart without angina pectoris Status post remote myocardial infarction in the 1990s.  Cardiac catheterization in January 2020 demonstrated aneurysmal, moderate nonobstructive disease in the LAD as well as moderate nonobstructive disease in the LCx and RCA.  He has been managed medically.  He is doing well without anginal symptoms.  Continue aspirin, carvedilol, isosorbide, pravastatin.  3. Chronic systolic CHF (congestive heart failure) (HCC) EF 40-45% by TEE 05/14/20   He cannot take ACE inhibitor/ARB or ARNI due to allergy to ACE inhibitor.  Continue current dose of carvedilol, isosorbide, and hydralazine   4. Essential hypertension Well controlled.  Continue current medications and low sodium Dash type diet.    5. Hyperlipidemia, unspecified hyperlipidemia type Continue high intensity statin therapy.   6. Smoking :  Quit 3 years ago still at risk 100 packyear history f/u  CT  Dispo:  F/U in a year   Medication Adjustments/Labs and Tests Ordered: Current medicines are reviewed at length with the patient today.  Concerns regarding medicines are outlined above.  Tests Ordered: Orders Placed This Encounter  Procedures  . CT CHEST LUNG CA SCREEN LOW DOSE W/O CM   Medication Changes: No orders of the defined types were placed in this encounter.   Signed, Jenkins Rouge, MD  11/12/2020 11:35 AM    [address], [place], [place]  [address] Phone: [phone]; Fax: [phone]

## 2020-11-06 ENCOUNTER — Telehealth: Payer: Self-pay | Admitting: Family Medicine

## 2020-11-06 NOTE — Chronic Care Management (AMB) (Signed)
  Chronic Care Management   Note  11/06/2020 Name: Nathan Fernandez MRN: [id number] DOB: Sep 06, 1941  Nathan Fernandez is a 79 y.o. year old male who is a primary care patient of Tonia Ghent, MD. I reached out to Nathan Fernandez by phone today in response to a referral sent by Nathan Fernandez PCP, Tonia Ghent, MD.   Nathan Fernandez was given information about Chronic Care Management services today including:  1. CCM service includes personalized support from designated clinical staff supervised by his physician, including individualized plan of care and coordination with other care providers 2. 24/7 contact phone numbers for assistance for urgent and routine care needs. 3. Service will only be billed when office clinical staff spend 20 minutes or more in a month to coordinate care. 4. Only one practitioner may furnish and bill the service in a calendar month. 5. The patient may stop CCM services at any time (effective at the end of the month) by phone call to the office staff.   BRENDA verbally agreed to assistance and services provided by embedded care coordination/care management team today.  Follow up plan:   [place]

## 2020-11-12 ENCOUNTER — Encounter: Payer: Self-pay | Admitting: Cardiovascular Disease

## 2020-11-12 ENCOUNTER — Other Ambulatory Visit: Payer: Self-pay

## 2020-11-12 ENCOUNTER — Ambulatory Visit: Payer: Medicare Other | Admitting: Cardiovascular Disease

## 2020-11-12 VITALS — BP 116/78 | HR 78 | Ht 69.0 in | Wt 170.0 lb

## 2020-11-12 DIAGNOSIS — I2510 Atherosclerotic heart disease of native coronary artery without angina pectoris: Secondary | ICD-10-CM

## 2020-11-12 DIAGNOSIS — Z87891 Personal history of nicotine dependence: Secondary | ICD-10-CM | POA: Diagnosis not present

## 2020-11-12 NOTE — Patient Instructions (Addendum)
Medication Instructions:  *If you need a refill on your cardiac medications before your next appointment, please call your pharmacy*  Lab Work: If you have labs (blood work) drawn today and your tests are completely normal, you will receive your results only by: Marland Kitchen MyChart Message (if you have MyChart) OR . A paper copy in the mail If you have any lab test that is abnormal or we need to change your treatment, we will call you to review the results.  Testing/Procedures: Non-Cardiac CT scanning for Lung Cancer Screening, (CAT scanning), is a noninvasive, special x-ray that produces cross-sectional images of the body using x-rays and a computer. CT scans help physicians diagnose and treat medical conditions. For some CT exams, a contrast material is used to enhance visibility in the area of the body being studied. CT scans provide greater clarity and reveal more details than regular x-ray exams.  Follow-Up: At [facility], you and your health needs are our priority.  As part of our continuing mission to provide you with exceptional heart care, we have created designated Provider Care Teams.  These Care Teams include your primary Cardiologist (physician) and Advanced Practice Providers (APPs -  Physician Assistants and Nurse Practitioners) who all work together to provide you with the care you need, when you need it.  We recommend signing up for the patient portal called "MyChart".  Sign up information is provided on this After Visit Summary.  MyChart is used to connect with patients for Virtual Visits (Telemedicine).  Patients are able to view lab/test results, encounter notes, upcoming appointments, etc.  Non-urgent messages can be sent to your provider as well.   To learn more about what you can do with MyChart, go to [email or web address].    Your next appointment:   6 month(s)  The format for your next appointment:   In Person  Provider:   You may see Jenkins Rouge, MD or one of the  following Advanced Practice Providers on your designated Care Team:    Truitt Merle, NP  Cecilie Kicks, NP  Kathyrn Drown, NP

## 2020-11-26 ENCOUNTER — Ambulatory Visit (INDEPENDENT_AMBULATORY_CARE_PROVIDER_SITE_OTHER)
Admission: RE | Admit: 2020-11-26 | Discharge: 2020-11-26 | Disposition: A | Discharge status: Home / Self Care | Payer: Medicare Other | Source: Ambulatory Visit | Attending: Cardiovascular Disease | Admitting: Cardiovascular Disease

## 2020-11-26 ENCOUNTER — Other Ambulatory Visit: Payer: Self-pay

## 2020-11-26 DIAGNOSIS — Z87891 Personal history of nicotine dependence: Secondary | ICD-10-CM

## 2020-11-26 DIAGNOSIS — J439 Emphysema, unspecified: Secondary | ICD-10-CM

## 2020-11-26 DIAGNOSIS — I700 Atherosclerosis of aorta: Secondary | ICD-10-CM

## 2020-12-01 LAB — CUP PACEART REMOTE DEVICE CHECK
Date Time Interrogation Session: 20211210003227
Implantable Pulse Generator Implant Date: 20210525

## 2020-12-03 ENCOUNTER — Ambulatory Visit (INDEPENDENT_AMBULATORY_CARE_PROVIDER_SITE_OTHER): Payer: Medicare Other

## 2020-12-03 DIAGNOSIS — I639 Cerebral infarction, unspecified: Secondary | ICD-10-CM

## 2020-12-18 NOTE — Progress Notes (Signed)
Carelink Summary Report / Loop Recorder 

## 2020-12-26 ENCOUNTER — Other Ambulatory Visit: Payer: Self-pay | Admitting: Family Medicine

## 2020-12-26 NOTE — Telephone Encounter (Signed)
Sent. Thanks.   

## 2020-12-26 NOTE — Telephone Encounter (Signed)
Please Advise on this 

## 2020-12-26 NOTE — Telephone Encounter (Signed)
Patients wife states that the patient is out of the following medication: Gemfibrozil Ph[facility]: [facility] [address] [place], [place]

## 2020-12-31 DIAGNOSIS — H02115 Cicatricial ectropion of left lower eyelid: Secondary | ICD-10-CM | POA: Diagnosis not present

## 2020-12-31 DIAGNOSIS — H02235 Paralytic lagophthalmos left lower eyelid: Secondary | ICD-10-CM | POA: Diagnosis not present

## 2020-12-31 DIAGNOSIS — Z09 Encounter for follow-up examination after completed treatment for conditions other than malignant neoplasm: Secondary | ICD-10-CM | POA: Diagnosis not present

## 2020-12-31 DIAGNOSIS — H04222 Epiphora due to insufficient drainage, left lacrimal gland: Secondary | ICD-10-CM | POA: Diagnosis not present

## 2021-01-01 ENCOUNTER — Other Ambulatory Visit: Payer: Self-pay | Admitting: Family Medicine

## 2021-01-01 ENCOUNTER — Other Ambulatory Visit: Payer: Self-pay | Admitting: Hematology

## 2021-01-01 NOTE — Telephone Encounter (Signed)
Please Advise

## 2021-01-02 NOTE — Telephone Encounter (Signed)
Please review request. Last appt with Dr. Irene Limbo 04/30/20- OV note stated to have Dr. Damita Dunnings manage blood thinners

## 2021-01-02 NOTE — Telephone Encounter (Signed)
Sent. Thanks.   

## 2021-01-04 ENCOUNTER — Telehealth: Payer: Self-pay

## 2021-01-04 NOTE — Chronic Care Management (AMB) (Addendum)
Chronic Care Management Pharmacy Assistant   Name: Nathan Fernandez  MRN: [id number] DOB: 1941-12-08  Reason for Encounter: Initial Questions for CCM 01/08/21  Patient did not answer when contacted on 01/04/21 for initial questions prior to CCM visit 01/08/21. Patient returned call 01/07/21. Notified CCM appointment on 01/08/21 would be a telephone visit and to have medications ready for review.  PCP : Tonia Ghent, MD  Allergies:   Allergies  Allergen Reactions   Ace Inhibitors Swelling    Swelling of the tongue   Angiotensin Receptor Blockers Swelling    Tongue swelling   Enalapril Swelling    Tongue swelling    Medications: Outpatient Encounter Medications as of 01/04/2021  Medication Sig   albuterol (VENTOLIN HFA) 108 (90 Base) MCG/ACT inhaler Inhale 1-2 puffs into the lungs every 6 (six) hours as needed for shortness of breath.   aspirin 81 MG EC tablet Take 81 mg by mouth daily.     buPROPion (WELLBUTRIN SR) 150 MG 12 hr tablet Take 1 tablet (150 mg total) by mouth daily.   carvedilol (COREG) 6.25 MG tablet Take 0.5 tablets (3.125 mg total) by mouth in the morning and at bedtime.   cyclobenzaprine (FLEXERIL) 5 MG tablet Take 1 tablet (5 mg total) by mouth 3 (three) times daily as needed for muscle spasms.   dorzolamide-timolol (COSOPT) 22.3-6.8 MG/ML ophthalmic solution Place 1 drop into both eyes 2 (two) times daily.   gemfibrozil (LOPID) 600 MG tablet TAKE 1 TABLET BY MOUTH TWICE A DAY   hydrALAZINE (APRESOLINE) 10 MG tablet Take 1 tablet (10 mg total) by mouth every 12 (twelve) hours.   ibuprofen (ADVIL) 800 MG tablet Take 800 mg by mouth daily.   isosorbide mononitrate (IMDUR) 30 MG 24 hr tablet TAKE 1/2 TABLET BY MOUTH EVERY DAY   ketoconazole (NIZORAL) 2 % cream Apply 1 application topically 2 (two) times daily as needed for irritation.   meclizine (ANTIVERT) 12.5 MG tablet Take 1-2 tablets (12.5-25 mg total) by mouth 3 (three) times daily as needed for dizziness.    nitroGLYCERIN (NITROSTAT) 0.4 MG SL tablet Place 1 tablet (0.4 mg total) under the tongue every 5 (five) minutes as needed for chest pain. Up to 3 doses   Polyethyl Glycol-Propyl Glycol (SYSTANE) 0.4-0.3 % GEL ophthalmic gel Place 1 application into the left eye at bedtime.   pravastatin (PRAVACHOL) 40 MG tablet TAKE 1 TABLET BY MOUTH EVERYDAY AT BEDTIME   traZODone (DESYREL) 100 MG tablet TAKE 2 TABLETS (200 MG TOTAL) BY MOUTH AT BEDTIME.   XARELTO 20 MG TABS tablet TAKE 1 TABLET BY MOUTH DAILY WITH SUPPER.   No facility-administered encounter medications on file as of 01/04/2021.    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   Hemoptysis 07/10/2019   Abnormal cardiovascular stress test    Tremor 09/02/2018   Advance care planning 09/02/2018   Cold intolerance 09/02/2018   Dry mouth 03/12/2018   Shoulder pain 01/05/2018   Hypoxia 12/29/2017   Wrist pain 12/29/2017   Lower back pain 02/18/2017   Lower urinary tract symptoms (LUTS) 02/11/2016   Bilateral knee pain 05/25/2015   Benign paroxysmal positional vertigo 02/18/2015   Abdominal wall symptom 02/18/2015   Gout 05/04/2014   Healthcare maintenance 04/18/2014   COPD (chronic obstructive pulmonary disease) ([place]) 03/25/2013   CAD (coronary artery  disease) 03/03/2013   Other malaise and fatigue 02/22/2013   Chest wall pain 05/31/2012   Heartburn 05/07/2011   Depression 11/28/2010   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

## 2021-01-07 ENCOUNTER — Ambulatory Visit (INDEPENDENT_AMBULATORY_CARE_PROVIDER_SITE_OTHER): Payer: Medicare Other

## 2021-01-07 DIAGNOSIS — I639 Cerebral infarction, unspecified: Secondary | ICD-10-CM | POA: Diagnosis not present

## 2021-01-08 ENCOUNTER — Telehealth: Payer: Self-pay

## 2021-01-08 ENCOUNTER — Other Ambulatory Visit: Payer: Self-pay

## 2021-01-08 ENCOUNTER — Ambulatory Visit: Payer: Medicare Other

## 2021-01-08 LAB — CUP PACEART REMOTE DEVICE CHECK
Date Time Interrogation Session: 20220112001304
Implantable Pulse Generator Implant Date: 20210525

## 2021-01-08 NOTE — Chronic Care Management (AMB) (Signed)
Chronic Care Management Pharmacy  Name: Nathan Fernandez  MRN: [id number] DOB: 1941-07-07  Chief Complaint/ HPI  Nathan Fernandez,  80 y.o. , male presents for their Initial CCM visit with the clinical pharmacist via telephone.  PCP : Tonia Ghent, MD  Their chronic conditions include: DM, HTN, HLD, COPD, BPH, insomnia, depression, chronic CHF (systolic)  Patient concerns: Denies any medication concerns. Reports a lot of congestion and coughing some blood the last few days. No pain or discomfort. Has been tobacco free for the past 4 years. Patient is on Xarelto and aspirin 81 mg. Only takes Tylenol for pain. 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 CAD - He cannot take ACE inhibitor/ARB or ARNI due to allergy. Continue aspirin, carvedilol, isosorbide, pravastatin.  05/02/20: Cardiology - EF 35-40 % 03/2020, Start hydralazine 10 mg BID.  Allergies  Allergen Reactions  . Ace Inhibitors Swelling    Swelling of the tongue  . Angiotensin Receptor Blockers Swelling    Tongue swelling  . Enalapril Swelling    Tongue swelling   Medications: Outpatient Encounter Medications as of 01/08/2021  Medication Sig  . albuterol (VENTOLIN HFA) 108 (90 Base) MCG/ACT inhaler Inhale 1-2 puffs into the lungs every 6 (six) hours as needed for shortness of breath.  Marland Kitchen aspirin 81 MG EC tablet Take 81 mg by mouth daily.  Marland Kitchen buPROPion (WELLBUTRIN SR) 150 MG 12 hr tablet Take 1 tablet (150 mg total) by mouth daily.  . carvedilol (COREG) 6.25 MG tablet Take 0.5 tablets (3.125 mg total) by mouth in the morning and at bedtime.  . dorzolamide-timolol (COSOPT) 22.3-6.8 MG/ML ophthalmic solution Place 1 drop into both eyes  2 (two) times daily.  Marland Kitchen gemfibrozil (LOPID) 600 MG tablet TAKE 1 TABLET BY MOUTH TWICE A DAY  . hydrALAZINE (APRESOLINE) 10 MG tablet Take 1 tablet (10 mg total) by mouth every 12 (twelve) hours.  . isosorbide mononitrate (IMDUR) 30 MG 24 hr tablet TAKE 1/2 TABLET BY MOUTH EVERY DAY  . pravastatin (PRAVACHOL) 40 MG tablet TAKE 1 TABLET BY MOUTH EVERYDAY AT BEDTIME  . traZODone (DESYREL) 100 MG tablet TAKE 2 TABLETS (200 MG TOTAL) BY MOUTH AT BEDTIME.  Marland Kitchen XARELTO 20 MG TABS tablet TAKE 1 TABLET BY MOUTH DAILY WITH SUPPER.  . cyclobenzaprine (FLEXERIL) 5 MG tablet Take 1 tablet (5 mg total) by mouth 3 (three) times daily as needed for muscle spasms.  Marland Kitchen ketoconazole (NIZORAL) 2 % cream Apply 1 application topically 2 (two) times daily as needed for irritation.  . meclizine (ANTIVERT) 12.5 MG tablet Take 1-2 tablets (12.5-25 mg total) by mouth 3 (three) times daily as needed for dizziness. (Patient not taking: Reported on 01/08/2021)  . nitroGLYCERIN (NITROSTAT) 0.4 MG SL tablet Place 1 tablet (0.4 mg total) under the tongue every 5 (five) minutes as needed for chest pain. Up to 3 doses  . Polyethyl Glycol-Propyl Glycol (SYSTANE) 0.4-0.3 % GEL ophthalmic gel Place 1 application into the left eye at bedtime.  . [DISCONTINUED] ibuprofen (ADVIL) 800 MG tablet Take 800 mg by mouth daily.   No facility-administered encounter medications on file as of 01/08/2021.   Current Diagnosis/Assessment: SDOH Interventions   Flowsheet Row  Most Recent Value  SDOH Interventions   Financial Strain Interventions --  [Apply for Xarelto patient assistance]      Goals Addressed            This Visit's Progress   . Pharmacy Care Plan       CARE PLAN ENTRY (see longitudinal plan of care for additional care plan information)  Current Barriers:  . Chronic Disease Management support, education, and care coordination needs related to Hypertension and Hyperlipidemia   Hypertension BP Readings from Last 3  Encounters:  11/12/20 116/78  09/07/20 122/72  06/14/20 132/88   . Pharmacist Clinical Goal(s): o Over the next 3 months, patient will work with PharmD and providers to maintain BP goal <130/80 . Current regimen:   Carvedilol 6.25 mg - 1/2 tablet twice daily  Hydralazine 10 mg - 1 tablet twice daily . Interventions: o Reviewed adherence and tolerance - doing well  o BP in clinic is within goal  . Patient self care activities - Over the next 3 month, patient will: o Continue medications as prescribed  Hyperlipidemia Lab Results  Component Value Date/Time   LDLCALC 103 (H) 02/16/2017 11:30 AM   LDLDIRECT 64.0 08/31/2020 09:25 AM   . Pharmacist Clinical Goal(s): o Over the next 3 months, patient will work with PharmD and providers to maintain LDL goal < 70 . Current regimen:  . Pravastatin 40 mg - 1 tablet daily . Gemfibrozil 600 mg - 1 tablet twice daily . Interventions: o Reviewed drug interactions - recommend switching statin to high intensity and holding gemfibrozil. Repeat lipid panel in 4 weeks. . Patient self care activities - Over the next 3 months, patient will: o Continue current therapy until further contact from clinic  Diabetes Lab Results  Component Value Date/Time   HGBA1C 7.3 (H) 08/31/2020 09:25 AM   HGBA1C 8.3 (H) 04/05/2020 01:59 PM   . Pharmacist Clinical Goal(s): o Over the next 3 months, patient will work with PharmD and providers to achieve A1c goal <7% . Current regimen:  o No pharmacotherapy . Interventions: o Reviewed lifestyle modification - patient is working on continued weight loss . 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 2 clots in splenic artery April 2021 30 day after receiving COVID vaccine per pt report  Last lipids Lab Results  Component Value  Date   CHOL 144 08/31/2019   HDL 28.80 (L) 08/31/2019   LDLCALC 103 (H) 02/16/2017   LDLDIRECT 64.0 08/31/2020   TRIG 320.0 (H) 08/31/2019   CHOLHDL 5 08/31/2019   Hepatic Function Latest Ref Rng & Units 08/31/2020 04/16/2020 04/04/2020  Total Protein 6.0 - 8.3 g/dL 6.7 7.3 7.8  Albumin 3.5 - 5.2 g/dL 4.0 3.2(L) 4.0  AST 0 - 37 U/L 14 14(L) 18  ALT 0 - 53 U/L $Remo'10 11 15  'kvhQO$ Alk Phosphatase 39 - 117 U/L 42 63 57  Total Bilirubin 0.2 - 1.2 mg/dL 0.6 0.5 0.7  Bilirubin, Direct 0.0 - 0.3 mg/dL - - -    The ASCVD Risk score Mikey Bussing DC Jr., et al., 2013) failed to calculate for the following reasons:   The 2013 ASCVD risk score is only valid for ages 74 to 42   The patient has a prior MI or stroke diagnosis   Patient has failed  these meds in past: none reported Patient is currently controlled on the following medications:  . Pravastatin 40 mg - 1 tablet daily . Gemfibrozil 600 mg - 1 tablet BID . Aspirin 81 mg - 1 tablet daily . Isosorbide mononitrate 30 mg - 1/2 tablet daily  We discussed: Patient has been on gemfibrozil since 2008 - TG were 700s. Started pravastatin 40 mg 2014 and has been off and on due to concern for muscle aches. Currently doing well on both medications despite risk X avoid combination drug interaction due to increased risk of rhabdomyolysis. Discussed potential to trial off gemfibrozil and switch statin to high intensity. Patient is interested to reduce medications. Will consult PCP.   Plan: Continue current medications; Consult PCP - recommend switch statin to rosuvastatin 20 mg, trial off gemfibrozil, monitor Lipids in 4 weeks.   Hypertension   CMP Latest Ref Rng & Units 01/09/2021 08/31/2020 04/16/2020  Glucose 65 - 99 mg/dL 149(H) 196(H) 163(H)  BUN 8 - 27 mg/dL $Remove'15 16 14  'grrpcvQ$ Creatinine 0.76 - 1.27 mg/dL 1.03 0.97 0.95  Sodium 134 - 144 mmol/L 138 136 138  Potassium 3.5 - 5.2 mmol/L 4.7 4.3 4.5  Chloride 96 - 106 mmol/L 101 101 103  CO2 20 - 29 mmol/L $RemoveB'23 30 27  'gsgmxyQW$ Calcium  8.6 - 10.2 mg/dL 9.3 9.2 9.7  Total Protein 6.0 - 8.3 g/dL - 6.7 7.3  Total Bilirubin 0.2 - 1.2 mg/dL - 0.6 0.5  Alkaline Phos 39 - 117 U/L - 42 63  AST 0 - 37 U/L - 14 14(L)  ALT 0 - 53 U/L - 10 11   Office blood pressures are: BP Readings from Last 3 Encounters:  01/09/21 (!) 155/95  11/12/20 116/78  09/07/20 122/72   BP goal < 130/80 mmHg due to cardiac history Patient has failed these meds in the past: ACEI allergy  Patient checks BP at home infrequently (wrist monitor) Patient home BP readings are ranging: none reported  Patient is currently controlled on the following medications:   Carvedilol 6.25 mg - 1/2 tablet BID (no refill history - has been on 1/2 tablet for a while)  Hydralazine 10 mg - 1 tablet  BID  We discussed: BP controlled in clinic. Pt also has HFrEF. Patient confirms taking carvedilol 1/2 tablet BID. No refill history. Hydralazine refills timely - started 04/2020. Managed by Richardson Dopp, [facility]. No history of amlodipine or thiazide use. Unable to tolerate ACE/ARB due to allergy.   Plan: Continue current medications  Diabetes   Recent Relevant Labs: Lab Results  Component Value Date/Time   HGBA1C 7.3 (H) 08/31/2020 09:25 AM   HGBA1C 8.3 (H) 04/05/2020 01:59 PM   MICROALBUR 4.7 (H) 11/25/2010 09:12 AM   MICROALBUR 2.9 (H) 01/31/2008 10:39 AM    A1c goal < 7%  Checking BG: none, does not have testing supplies Denies any symptoms of hypoglycemia   Patient has failed these meds in past: metformin (2010-2013), no problems  Patient is currently uncontrolled on the following medications:   No pharmacotherapy  Eye Exam - Reports up to date  Foot exam - 09/07/20  We discussed: Patient reports he has lost 40 lbs in the past 6-8 months due to cutting back on portions. Continues to work on healthy lifestyle for diabetic control.   Plan: Continue control with diet and exercise   Depression/Insomnia    Patient has failed these meds in past: none  reported Patient is currently controlled on the following medications:  Bupropion 150 mg - 1 tablet daily  Trazodone 100 mg - 2 tablets at bedtime  Update 01/08/21: Patient reports started bupropion for smoking cessation originally. Denies any problems with mood. Patient would like to keep taking for now to complete supply. Patient will discuss stopping at next PCP visit. Reports trazodone is helping with his sleep.  Plan: Continue current medications  Osteoarthritis   Reports pain is in left lower back primarily  Describes as sharp pain, worst in the morning Patient has failed these meds in past: none  Patient is currently controlled on the following medications:   Tylenol 500 mg - 2 tablets every morning   We discussed: Also using heating pad, no topical creams or OTC NSAIDs. Discussed trying lidocaine patches for uncontrolled pain.  Plan: Continue current medications; May try lidocaine patches OTC.   Medication Management   Misc:  Using albuterol very rarely - a couple times a year  Dorzolamide/timolol eye drops - 1 drop BID (pt only using once a day - eye doctor aware)  OTC:  Vitamin D3 5000 IU - 1 daily  Super C Immune Complex with Zinc ([employer])  Elderberry 1250 mg daily  Patient's preferred pharmacy is:   [id number] [address], [place] [address] Phone: [phone] Fax: [phone]  Uses pill box? Yes - morning meds only, puts night meds at computer desk Pt endorses 100% compliance - misses a dose less than once a month  Reports Xarelto cost can be high, but able to afford --> would like to apply for patient assistance Xarelto  Damita Dunnings plans to prescribe going forward - Mail application to patient   We discussed: Discussed benefits of medication synchronization, packaging and delivery as well as enhanced pharmacist oversight with [employer].  Plan  Continue current medication management  strategy  Follow up: 3 month phone visit; Coordinate Xarelto patient assistance  Debbora Dus, PharmD Clinical Pharmacist [facility] at [facility] [phone]

## 2021-01-08 NOTE — Telephone Encounter (Signed)
Patient reports a lot of congestion and coughing blood the last few days. Denies any blood in stool, urine, or other bleeding. No pain or discomfort. Has been tobacco free for the past 4 years. Patient is on Xarelto and aspirin 81 mg. No other blood thinners. Uses  Tylenol only for pain, denies NSAIDs.  Debbora Dus, PharmD Clinical Pharmacist [facility] at [facility] [phone]

## 2021-01-08 NOTE — Telephone Encounter (Addendum)
Unable to reach pt or pts wife on any contact #s; left v/m requesting cb on all available v/ms for pt and his wife except pts wife cell has no v/m set up. Will try again later. Sending note to [facility] LPN and Dr Damita Dunnings as Juluis Rainier. Per med list Xarelto is prescribed by Dr Brunetta Genera hematologist and oncologist with [facility]. I spoke with pt; pt said he has had prod cough with white phlegm for some time and had made Dr Damita Dunnings aware of that. But starting on 01/05/21 pt continues to cough up white phlegm that is either blood tinged or pt coughs up like a blood clot sized from dime size to quarter sized;; pt said sometimes is bright red and sometimes is dark red or combination of both. Pt said last time coughed up blood was combo of bright red and dark phlegm the size of a quarter; may not as much blood this time more blood tinged about 30' ago. Pt has no SOB or CP or abd pain. Pt took Baby asa this morning but has not taken xarelto today; pt usually takes xarelto after supper. I spoke with Dr Damita Dunnings and he said to hold xarelto until evaluated, labs done and possible xrays. Pt will also hold ASA in the morning until seen at [facility] at [facility] UC & ED precautions given and pt voiced understanding. Sending note to Dr Damita Dunnings and Dr Irene Limbo. pts wife called back to ask if hemp extract gummies could cause bleeding. Pt has been taking hemp extract gummies bid for about one wk; last taken on 01/07/21. Dr Damita Dunnings advised not to take any more hemp extract gummies. Mrs Altschuler voiced understanding and will not give pt any med OTC without approval from a doctor. FYI to Dr Damita Dunnings and Dr Irene Limbo.

## 2021-01-08 NOTE — Telephone Encounter (Signed)
Noted. Thanks. Please triage patient re: blood/sputum.

## 2021-01-09 ENCOUNTER — Other Ambulatory Visit: Payer: Self-pay

## 2021-01-09 ENCOUNTER — Ambulatory Visit
Admission: EM | Admit: 2021-01-09 | Discharge: 2021-01-09 | Disposition: A | Discharge status: Home / Self Care | Payer: Medicare Other | Attending: Emergency Medicine | Admitting: Emergency Medicine

## 2021-01-09 ENCOUNTER — Encounter: Payer: Self-pay | Admitting: Emergency Medicine

## 2021-01-09 ENCOUNTER — Ambulatory Visit (INDEPENDENT_AMBULATORY_CARE_PROVIDER_SITE_OTHER): Payer: Medicare Other

## 2021-01-09 DIAGNOSIS — R42 Dizziness and giddiness: Secondary | ICD-10-CM | POA: Diagnosis not present

## 2021-01-09 DIAGNOSIS — J441 Chronic obstructive pulmonary disease with (acute) exacerbation: Secondary | ICD-10-CM

## 2021-01-09 DIAGNOSIS — J439 Emphysema, unspecified: Secondary | ICD-10-CM | POA: Diagnosis not present

## 2021-01-09 DIAGNOSIS — R0602 Shortness of breath: Secondary | ICD-10-CM | POA: Diagnosis not present

## 2021-01-09 DIAGNOSIS — R042 Hemoptysis: Secondary | ICD-10-CM

## 2021-01-09 MED ORDER — ALBUTEROL SULFATE HFA 108 (90 BASE) MCG/ACT IN AERS
2.0000 | INHALATION_SPRAY | Freq: Once | RESPIRATORY_TRACT | Status: AC
  Administered 2021-01-09: 2 via RESPIRATORY_TRACT

## 2021-01-09 MED ORDER — BENZONATATE 200 MG PO CAPS: 200.0000 mg | ORAL_CAPSULE | Freq: Three times a day (TID) | ORAL | 0 refills | Status: AC | PRN

## 2021-01-09 MED ORDER — PREDNISONE 10 MG PO TABS: ORAL_TABLET | ORAL | 0 refills | Status: DC

## 2021-01-09 NOTE — ED Provider Notes (Addendum)
[facility]    CSN: [id number] Arrival date & time: 01/09/21  1149      History   Chief Complaint Chief Complaint  Patient presents with  . Hemoptysis    HPI Nathan Fernandez is a 80 y.o. male history of CAD, COPD, DM type II, hypertension, presenting today for evaluation of a cough.  Reports that he has had a cough for approximately 1 year, over the past 4 days he has had blood-tinged sputum intermittently.  He denies any increased shortness of breath from baseline.  Did recently have CT scan on 12/6 for cancer screening due to prior tobacco use.  Quit approximately 3 to 4 years ago.  Has known emphysema noted on prior imaging.  Reports checking O2 at home and is typically around 94, reports that it was 94% this morning.  Reports hemoptysis intermittent, will come and go.  Denies worsening cough from baseline.  He also reports increased lightheadedness of recently.  Has felt very fatigued.  HPI  Past Medical History:  Diagnosis Date  . 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   Hosp R facial weakness  . CAD (coronary artery disease)    MI, Hosp 1993, PTCA; LHC 04/18/13: Distal left main 40%, proximal LAD with several aneurysmal segments, proximal LAD 50% prior to and after aneurysmal segments, area does not appear to flow-limiting, proximal diagonal 70%, ostial circumflex 20%, proximal circumflex 20%, OM1 30-40%, proximal RCA 40%, mid RCA 30%, distal RCA 20%, EF 50% => med Rx  . COPD (chronic obstructive pulmonary disease) (HCC)   . DM2 (diabetes mellitus, type 2) (HCC)   . History of ETT 1998   wnl  . History of MRI 08/04/09   brain- atrophy sm vess dz  . HLD (hyperlipidemia)   . HTN (hypertension)   . OA (osteoarthritis)     Patient Active Problem List   Diagnosis Date Noted  . Long term (current) use of anticoagulants 05/27/2020  . 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 Hemoptysis 07/10/2019  . Abnormal cardiovascular stress test   .  Tremor 09/02/2018  . Advance care planning 09/02/2018  . Cold intolerance 09/02/2018  . Dry mouth 03/12/2018  . Shoulder pain 01/05/2018  . Hypoxia 12/29/2017  . Wrist pain 12/29/2017  . Lower back pain 02/18/2017  . Lower urinary tract symptoms (LUTS) 02/11/2016  . Bilateral knee pain 05/25/2015  . Benign paroxysmal positional vertigo 02/18/2015  . Abdominal wall symptom 02/18/2015  . Gout 05/04/2014  . Healthcare maintenance 04/18/2014  . COPD (chronic obstructive pulmonary disease) (HCC) 03/25/2013  . CAD (coronary artery disease) 03/03/2013  . Other malaise and fatigue 02/22/2013  . Chest wall pain 05/31/2012  . Heartburn 05/07/2011  . Depression 11/28/2010  . 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 Diabetes mellitus (HCC) 03/18/2010  . 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 HYPERCHOLESTEROLEMIA 10/19/2007  . 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 HYPERTENSION, BENIGN 10/19/2007  . ROSACEA 10/19/2007  . Osteoarthritis 10/19/2007  . Insomnia 10/19/2007    Past Surgical History:  Procedure Laterality Date  . BUBBLE STUDY  05/15/2020   Procedure: BUBBLE STUDY;  Surgeon: Chrystie Nose, MD;  Location: [facility] ENDOSCOPY;  Service: Cardiovascular;;  . cataract surgery  09/06   repair lens which moved  . EYE SURGERY    . LEFT HEART CATH AND CORONARY ANGIOGRAPHY N/A 01/21/2019   Procedure: LEFT HEART CATH AND CORONARY ANGIOGRAPHY;  Surgeon: Kathleene Hazel, MD;  Location:  [place] INVASIVE CV LAB;  Service: Cardiovascular;  Laterality: N/A;  . LOOP RECORDER INSERTION N/A 05/15/2020   Procedure: LOOP RECORDER INSERTION;  Surgeon: Thompson Grayer, MD;  Location: [place] CV [facility];  Service: Cardiovascular;  Laterality: N/A;  . stress myoview  06/29/06   sm distal anteroseptal & apical infarct  . TEE WITHOUT CARDIOVERSION N/A 05/15/2020   Procedure: TRANSESOPHAGEAL ECHOCARDIOGRAM (TEE);  Surgeon: Pixie Casino, MD;  Location: [facility]  ENDOSCOPY;  Service: Cardiovascular;  Laterality: N/A;  . THYROIDECTOMY, PARTIAL  1967   B9 growth       Home Medications    Prior to Admission medications   Medication Sig Start Date End Date Taking? Authorizing Provider  benzonatate (TESSALON) 200 MG capsule Take 1 capsule (200 mg total) by mouth 3 (three) times daily as needed for up to 7 days for cough. 01/09/21 01/16/21 Yes Cobey Raineri C, PA-C  predniSONE (DELTASONE) 10 MG tablet Begin with 6 tabs on day 1, 5 tab on day 2, 4 tab on day 3, 3 tab on day 4, 2 tab on day 5, 1 tab on day 6-take with food 01/09/21  Yes Quintasia Theroux C, PA-C  albuterol (VENTOLIN HFA) 108 (90 Base) MCG/ACT inhaler Inhale 1-2 puffs into the lungs every 6 (six) hours as needed for shortness of breath. 09/06/19   Tonia Ghent, MD  aspirin 81 MG EC tablet Take 81 mg by mouth daily.    [provider]  buPROPion (WELLBUTRIN SR) 150 MG 12 hr tablet Take 1 tablet (150 mg total) by mouth daily. 09/07/20   Tonia Ghent, MD  carvedilol (COREG) 6.25 MG tablet Take 0.5 tablets (3.125 mg total) by mouth in the morning and at bedtime. 09/07/20   Tonia Ghent, MD  cyclobenzaprine (FLEXERIL) 5 MG tablet Take 1 tablet (5 mg total) by mouth 3 (three) times daily as needed for muscle spasms. 07/07/19   Tonia Ghent, MD  dorzolamide-timolol (COSOPT) 22.3-6.8 MG/ML ophthalmic solution Place 1 drop into both eyes 2 (two) times daily. 02/13/20   [provider]  gemfibrozil (LOPID) 600 MG tablet TAKE 1 TABLET BY MOUTH TWICE A DAY 12/26/20   Tonia Ghent, MD  hydrALAZINE (APRESOLINE) 10 MG tablet Take 1 tablet (10 mg total) by mouth every 12 (twelve) hours. 05/02/20 05/02/21  Richardson Dopp T, PA-C  isosorbide mononitrate (IMDUR) 30 MG 24 hr tablet TAKE 1/2 TABLET BY MOUTH EVERY DAY 05/07/20   Josue Hector, MD  ketoconazole (NIZORAL) 2 % cream Apply 1 application topically 2 (two) times daily as needed for irritation. 03/11/18   Tonia Ghent, MD   meclizine (ANTIVERT) 12.5 MG tablet Take 1-2 tablets (12.5-25 mg total) by mouth 3 (three) times daily as needed for dizziness. Patient not taking: Reported on 01/08/2021 02/16/15   Tonia Ghent, MD  nitroGLYCERIN (NITROSTAT) 0.4 MG SL tablet Place 1 tablet (0.4 mg total) under the tongue every 5 (five) minutes as needed for chest pain. Up to 3 doses 02/11/19   Josue Hector, MD  Polyethyl Glycol-Propyl Glycol (SYSTANE) 0.4-0.3 % GEL ophthalmic gel Place 1 application into the left eye at bedtime.    [provider]  pravastatin (PRAVACHOL) 40 MG tablet TAKE 1 TABLET BY MOUTH EVERYDAY AT BEDTIME 07/03/20   Tonia Ghent, MD  traZODone (DESYREL) 100 MG tablet TAKE 2 TABLETS (200 MG TOTAL) BY MOUTH AT BEDTIME. 01/02/21   Tonia Ghent, MD  XARELTO 20 MG TABS tablet TAKE 1 TABLET  BY MOUTH DAILY WITH SUPPER. 01/03/21   Brunetta Genera, MD    Family History Family History  Problem Relation Age of Onset  . Hypertension Mother   . Aneurysm Mother   . Stroke Mother   . Hypertension Father   . Heart disease Father        CAD  . Heart disease Son        MI  . Colon cancer Neg Hx   . Prostate cancer Neg Hx     Social History Social History   Tobacco Use  . Smoking status: Former Smoker    Types: Cigars  . Smokeless tobacco: Never Used  . Tobacco comment: cigar occassionally  Vaping Use  . Vaping Use: Never used  Substance Use Topics  . Alcohol use: Not Currently    Alcohol/week: 0.0 standard drinks  . Drug use: No     Allergies   Ace inhibitors, Angiotensin receptor blockers, and Enalapril   Review of Systems Review of Systems  Constitutional: Negative for activity change, appetite change, chills, fatigue and fever.  HENT: Positive for congestion. Negative for ear pain, rhinorrhea, sinus pressure, sore throat and trouble swallowing.   Eyes: Negative for discharge and redness.  Respiratory: Positive for cough. Negative for chest tightness and shortness of  breath.   Cardiovascular: Negative for chest pain.  Gastrointestinal: Negative for abdominal pain, diarrhea, nausea and vomiting.  Musculoskeletal: Negative for myalgias.  Skin: Negative for rash.  Neurological: Positive for light-headedness. Negative for dizziness and headaches.     Physical Exam Triage Vital Signs ED Triage Vitals  Enc Vitals Group     BP 01/09/21 1546 (!) 155/95     Pulse Rate 01/09/21 1546 90     Resp 01/09/21 1546 20     Temp 01/09/21 1546 98 F (36.7 C)     Temp Source 01/09/21 1546 Oral     SpO2 01/09/21 1546 91 %     Weight --      Height --      Head Circumference --      Peak Flow --      Pain Score 01/09/21 1547 0     Pain Loc --      Pain Edu? --      Excl. in [place]? --    No data found.  Updated Vital Signs BP (!) 155/95 (BP Location: Left Arm)   Pulse 90   Temp 98 F (36.7 C) (Oral)   Resp 20   SpO2 91%   Visual Acuity Right Eye Distance:   Left Eye Distance:   Bilateral Distance:    Right Eye Near:   Left Eye Near:    Bilateral Near:     Physical Exam Vitals and nursing note reviewed.  Constitutional:      Appearance: He is well-developed and well-nourished.     Comments: No acute distress  HENT:     Head: Normocephalic and atraumatic.     Ears:     Comments: Bilateral ears without tenderness to palpation of external auricle, tragus and mastoid, EAC's without erythema or swelling, TM's with good bony landmarks and cone of light. Non erythematous.     Nose: Nose normal.     Mouth/Throat:     Comments: Oral mucosa pink and moist, no tonsillar enlargement or exudate. Posterior pharynx patent and nonerythematous, no uvula deviation or swelling. Normal phonation. Eyes:     Conjunctiva/sclera: Conjunctivae normal.  Cardiovascular:     Rate and Rhythm: Normal  rate.  Pulmonary:     Effort: Pulmonary effort is normal. No respiratory distress.     Comments: Breathing comfortably at rest, CTABL, no wheezing, rales or other  adventitious sounds auscultated Abdominal:     General: There is no distension.  Musculoskeletal:        General: Normal range of motion.     Cervical back: Neck supple.  Skin:    General: Skin is warm and dry.  Neurological:     Mental Status: He is alert and oriented to person, place, and time.  Psychiatric:        Mood and Affect: Mood and affect normal.      UC Treatments / Results  Labs (all labs ordered are listed, but only abnormal results are displayed) Labs Reviewed  BASIC METABOLIC PANEL  CBC WITH DIFFERENTIAL/PLATELET    EKG   Radiology DG Chest 2 View  Result Date: 01/09/2021 CLINICAL DATA:  Shortness of breath and hemoptysis. EXAM: CHEST - 2 VIEW COMPARISON:  Chest CT 11/26/2020 FINDINGS: A left-sided loop recorder is noted. Cardiac silhouette, mediastinal and hilar contours are within normal limits and stable. Moderate tortuosity and mild calcification of the thoracic aorta. The lungs are clear of an acute process. No pulmonary lesions or pleural effusions. Stable severe emphysematous changes and pulmonary scarring. IMPRESSION: Stable emphysematous changes and pulmonary scarring. No acute cardiopulmonary findings. Electronically Signed   By: Marijo Sanes M.D.   On: 01/09/2021 16:06    Procedures Procedures (including critical care time)  Medications Ordered in UC Medications  albuterol (VENTOLIN HFA) 108 (90 Base) MCG/ACT inhaler 2 puff (2 puffs Inhalation Given 01/09/21 1628)    Initial Impression / Assessment and Plan / UC Course  I have reviewed the triage vital signs and the nursing notes.  Pertinent labs & imaging results that were available during my care of the patient were reviewed by me and considered in my medical decision making (see chart for details).     Chest x-ray stable with continued emphysema changes, treating for COPD exacerbation with prednisone and albuterol inhaler, Tessalon for cough, suspect likely hemoptysis may be slightly  related to Xarelto but likely inflammation within chest.  Advised to continue this, risk of clot more concerning than blood-tinged mucus.  Checking blood work and basic labs given reported lightheadedness to ensure hemoglobin, electrolytes and kidney function at baseline.  Discussed O2 in clinic 91/92%, slightly lower than baseline, advised to continue to monitor at home and if oxygen saturation is dropping into 80s to go to the emergency room.  Discussed strict return precautions. Patient verbalized understanding and is agreeable with plan.  Final Clinical Impressions(s) / UC Diagnoses   Final diagnoses:  Lightheadedness  Hemoptysis     Discharge Instructions     Chest x-ray without any signs of pneumonia Continue regular medicines as prescribed Albuterol inhaler 1 to 2 puffs every 4-6 hours for the first 24 to 48 hours then go to as needed for any shortness of breath chest tightness and wheezing Prednisone taper over the next 6 days-begin with 6 tablets, decrease by 1 tablet each day until complete-6, 5, 4, 3, 2, 1-take with food in the morning if able Tessalon every 8 hours for cough  Blood work pending-I will call if abnormal  Follow-up in the emergency room if symptoms progressing or worsening, follow-up with primary care as well    ED Prescriptions    Medication Sig Dispense Auth. Provider   predniSONE (DELTASONE) 10 MG tablet Begin with  6 tabs on day 1, 5 tab on day 2, 4 tab on day 3, 3 tab on day 4, 2 tab on day 5, 1 tab on day 6-take with food 21 tablet Ulysses Alper C, PA-C   benzonatate (TESSALON) 200 MG capsule Take 1 capsule (200 mg total) by mouth 3 (three) times daily as needed for up to 7 days for cough. 28 capsule Eryx Zane, [place] C, PA-C     PDMP not reviewed this encounter.   Janith Lima, PA-C 01/09/21 1744    Janith Lima, PA-C 01/09/21 1744

## 2021-01-09 NOTE — Discharge Instructions (Addendum)
Chest x-ray without any signs of pneumonia Continue regular medicines as prescribed Albuterol inhaler 1 to 2 puffs every 4-6 hours for the first 24 to 48 hours then go to as needed for any shortness of breath chest tightness and wheezing Prednisone taper over the next 6 days-begin with 6 tablets, decrease by 1 tablet each day until complete-6, 5, 4, 3, 2, 1-take with food in the morning if able Tessalon every 8 hours for cough  Blood work pending-I will call if abnormal  Follow-up in the emergency room if symptoms progressing or worsening, follow-up with primary care as well

## 2021-01-09 NOTE — Telephone Encounter (Signed)
Agreed.  I will await the consult note.  Thanks.  Please get update on patient tomorrow/Thursday.

## 2021-01-09 NOTE — ED Triage Notes (Signed)
Pt with cough x 1 year with blood tinged sputum x 4 days intermittently; denies increased SOB but has hx of COPD

## 2021-01-10 LAB — BASIC METABOLIC PANEL
BUN/Creatinine Ratio: 15 (ref 10–24)
BUN: 15 mg/dL (ref 8–27)
CO2: 23 mmol/L (ref 20–29)
Calcium: 9.3 mg/dL (ref 8.6–10.2)
Chloride: 101 mmol/L (ref 96–106)
Creatinine, Ser: 1.03 mg/dL (ref 0.76–1.27)
GFR calc Af Amer: 79 mL/min/{1.73_m2} (ref >59)
GFR calc non Af Amer: 68 mL/min/{1.73_m2} (ref >59)
Glucose: 149 mg/dL — ABNORMAL HIGH (ref 65–99)
Potassium: 4.7 mmol/L (ref 3.5–5.2)
Sodium: 138 mmol/L (ref 134–144)

## 2021-01-10 LAB — CBC WITH DIFFERENTIAL/PLATELET
Basophils Absolute: 0.1 10*3/uL (ref 0.0–0.2)
Basos: 1 %
EOS (ABSOLUTE): 0.2 10*3/uL (ref 0.0–0.4)
Eos: 3 %
Hematocrit: 45.7 % (ref 37.5–51.0)
Hemoglobin: 16.2 g/dL (ref 13.0–17.7)
Immature Grans (Abs): 0 10*3/uL (ref 0.0–0.1)
Immature Granulocytes: 0 %
Lymphocytes Absolute: 1.4 10*3/uL (ref 0.7–3.1)
Lymphs: 22 %
MCH: 31.2 pg (ref 26.6–33.0)
MCHC: 35.4 g/dL (ref 31.5–35.7)
MCV: 88 fL (ref 79–97)
Monocytes Absolute: 0.7 10*3/uL (ref 0.1–0.9)
Monocytes: 11 %
Neutrophils Absolute: 4.1 10*3/uL (ref 1.4–7.0)
Neutrophils: 63 %
Platelets: 164 10*3/uL (ref 150–450)
RBC: 5.19 x10E6/uL (ref 4.14–5.80)
RDW: 12.7 % (ref 11.6–15.4)
WBC: 6.4 10*3/uL (ref 3.4–10.8)

## 2021-01-10 NOTE — Telephone Encounter (Signed)
Spoke with patient and advised him on below message. Patient had already started back on both meds but will stop them again until sputum is clear again. Patient is scheduled to come in for f/u on 01/18/21 at 11:30 am.

## 2021-01-10 NOTE — Telephone Encounter (Signed)
Spoke with patient this morning. He is doing much better today; went to UC yesterday. Patient was given Prednisone and benzonatate caps. He has not had any dizziness or SOB today but has coughed up some blood. Patient states its become less and less each time.

## 2021-01-10 NOTE — Telephone Encounter (Signed)
Once he doesn't have any blood in sputum for 24 hours, then I would restart his baseline meds (aspirin and xarelto) and please schedule f/u here next week.    If he has return of blood in sputum, then hold both meds again.  Thanks.

## 2021-01-10 NOTE — Telephone Encounter (Signed)
Called home number; no answer and VM was full. Called cell number and LMTCB

## 2021-01-16 ENCOUNTER — Telehealth: Payer: Self-pay

## 2021-01-16 DIAGNOSIS — E7800 Pure hypercholesterolemia, unspecified: Secondary | ICD-10-CM

## 2021-01-16 DIAGNOSIS — E119 Type 2 diabetes mellitus without complications: Secondary | ICD-10-CM

## 2021-01-16 MED ORDER — ROSUVASTATIN CALCIUM 20 MG PO TABS
20.0000 mg | ORAL_TABLET | Freq: Every day | ORAL | 3 refills | Status: DC
Start: 2021-01-16 — End: 2022-02-09

## 2021-01-16 NOTE — Telephone Encounter (Signed)
This is reasonable to try.  I had not changed his medications before because his LDL had been reasonable and he had tolerated the combination, but I get the point about decreasing the total number of medications.  I sent the prescription for Crestor/rosuvastatin.  Please have him stop gemfibrozil and pravastatin.  Please get him set up for a lab visit in about 6 weeks.  I put in the orders.  If he has new muscle aches on rosuvastatin then let me know.  Thanks.  =============== For charting purposes, he has follow-up in the near future regarding previous mild hemoptysis.

## 2021-01-16 NOTE — Patient Instructions (Addendum)
Dear Nathan Fernandez,  It was a pleasure meeting you during our initial appointment on January 08, 2021. Below is a summary of the goals we discussed and components of chronic care management. Please contact me anytime with questions or concerns.   Visit Information  Goals Addressed            This Visit's Progress   . Pharmacy Care Plan       CARE PLAN ENTRY (see longitudinal plan of care for additional care plan information)  Current Barriers:  . Chronic Disease Management support, education, and care coordination needs related to Hypertension and Hyperlipidemia   Hypertension BP Readings from Last 3 Encounters:  11/12/20 116/78  09/07/20 122/72  06/14/20 132/88   . Pharmacist Clinical Goal(s): o Over the next 3 months, patient will work with PharmD and providers to maintain BP goal <130/80 . Current regimen:   Carvedilol 6.25 mg - 1/2 tablet twice daily  Hydralazine 10 mg - 1 tablet twice daily . Interventions: o Reviewed adherence and tolerance - doing well  o BP in clinic is within goal  . Patient self care activities - Over the next 3 month, patient will: o Continue medications as prescribed  Hyperlipidemia Lab Results  Component Value Date/Time   LDLCALC 103 (H) 02/16/2017 11:30 AM   LDLDIRECT 64.0 08/31/2020 09:25 AM   . Pharmacist Clinical Goal(s): o Over the next 3 months, patient will work with PharmD and providers to maintain LDL goal < 70 . Current regimen:  . Pravastatin 40 mg - 1 tablet daily . Gemfibrozil 600 mg - 1 tablet twice daily . Interventions: o Reviewed drug interactions - recommend switching statin to high intensity and holding gemfibrozil. Repeat lipid panel in 4 weeks. . Patient self care activities - Over the next 3 months, patient will: o Continue current therapy until further contact from clinic  Diabetes Lab Results  Component Value Date/Time   HGBA1C 7.3 (H) 08/31/2020 09:25 AM   HGBA1C 8.3 (H) 04/05/2020 01:59 PM    . Pharmacist Clinical Goal(s): o Over the next 3 months, patient will work with PharmD and providers to achieve A1c goal <7% . Current regimen:  o No pharmacotherapy . Interventions: o Reviewed lifestyle modification - patient is working on continued weight loss . Patient self care activities - Over the next 3 months, patient will: o Contact provider with any episodes of hypoglycemia  Initial goal documentation       Nathan Fernandez was given information about Chronic Care Management services today including:  1. CCM service includes personalized support from designated clinical staff supervised by his physician, including individualized plan of care and coordination with other care providers 2. 24/7 contact phone numbers for assistance for urgent and routine care needs. 3. Standard insurance, coinsurance, copays and deductibles apply for chronic care management only during months in which we provide at least 20 minutes of these services. Most insurances cover these services at 100%, however patients may be responsible for any copay, coinsurance and/or deductible if applicable. This service may help you avoid the need for more expensive face-to-face services. 4. Only one practitioner may furnish and bill the service in a calendar month. 5. The patient may stop CCM services at any time (effective at the end of the month) by phone call to the office staff.  Patient agreed to services and verbal consent obtained.   The patient verbalized understanding of instructions, educational materials, and care plan provided today and agreed to receive a mailed  copy of patient instructions, educational materials, and care plan.  Telephone follow up appointment with pharmacy team member scheduled for: April 15, 2021 at 8:30 AM PHONE VISIT  Phil Dopp, PharmD Clinical Pharmacist [facility] at [facility] [phone]   Health Maintenance After Age 84 After age 4, you are at a higher  risk for certain long-term diseases and infections as well as injuries from falls. Falls are a major cause of broken bones and head injuries in people who are older than age 3. Getting regular preventive care can help to keep you healthy and well. Preventive care includes getting regular testing and making lifestyle changes as recommended by your health care provider. Talk with your health care provider about:  Which screenings and tests you should have. A screening is a test that checks for a disease when you have no symptoms.  A diet and exercise plan that is right for you. What should I know about screenings and tests to prevent falls? Screening and testing are the best ways to find a health problem early. Early diagnosis and treatment give you the best chance of managing medical conditions that are common after age 101. Certain conditions and lifestyle choices may make you more likely to have a fall. Your health care provider may recommend:  Regular vision checks. Poor vision and conditions such as cataracts can make you more likely to have a fall. If you wear glasses, make sure to get your prescription updated if your vision changes.  Medicine review. Work with your health care provider to regularly review all of the medicines you are taking, including over-the-counter medicines. Ask your health care provider about any side effects that may make you more likely to have a fall. Tell your health care provider if any medicines that you take make you feel dizzy or sleepy.  Osteoporosis screening. Osteoporosis is a condition that causes the bones to get weaker. This can make the bones weak and cause them to break more easily.  Blood pressure screening. Blood pressure changes and medicines to control blood pressure can make you feel dizzy.  Strength and balance checks. Your health care provider may recommend certain tests to check your strength and balance while standing, walking, or changing  positions.  Foot health exam. Foot pain and numbness, as well as not wearing proper footwear, can make you more likely to have a fall.  Depression screening. You may be more likely to have a fall if you have a fear of falling, feel emotionally low, or feel unable to do activities that you used to do.  Alcohol use screening. Using too much alcohol can affect your balance and may make you more likely to have a fall. What actions can I take to lower my risk of falls? General instructions  Talk with your health care provider about your risks for falling. Tell your health care provider if: ? You fall. Be sure to tell your health care provider about all falls, even ones that seem minor. ? You feel dizzy, sleepy, or off-balance.  Take over-the-counter and prescription medicines only as told by your health care provider. These include any supplements.  Eat a healthy diet and maintain a healthy weight. A healthy diet includes low-fat dairy products, low-fat (lean) meats, and fiber from whole grains, beans, and lots of fruits and vegetables. Home safety  Remove any tripping hazards, such as rugs, cords, and clutter.  Install safety equipment such as grab bars in bathrooms and safety rails on stairs.  Keep rooms and walkways well-lit. Activity  Follow a regular exercise program to stay fit. This will help you maintain your balance. Ask your health care provider what types of exercise are appropriate for you.  If you need a cane or walker, use it as recommended by your health care provider.  Wear supportive shoes that have nonskid soles.   Lifestyle  Do not drink alcohol if your health care provider tells you not to drink.  If you drink alcohol, limit how much you have: ? 0-1 drink a day for women. ? 0-2 drinks a day for men.  Be aware of how much alcohol is in your drink. In the [place] one drink equals one typical bottle of beer (12 oz), one-half glass of wine (5 oz), or one shot of hard  liquor (1 oz).  Do not use any products that contain nicotine or tobacco, such as cigarettes and e-cigarettes. If you need help quitting, ask your health care provider. Summary  Having a healthy lifestyle and getting preventive care can help to protect your health and wellness after age 41.  Screening and testing are the best way to find a health problem early and help you avoid having a fall. Early diagnosis and treatment give you the best chance for managing medical conditions that are more common for people who are older than age 69.  Falls are a major cause of broken bones and head injuries in people who are older than age 66. Take precautions to prevent a fall at home.  Work with your health care provider to learn what changes you can make to improve your health and wellness and to prevent falls. This information is not intended to replace advice given to you by your health care provider. Make sure you discuss any questions you have with your health care provider. Document Revised: 03/31/2019 Document Reviewed: 10/21/2017 Elsevier Patient Education  2021 [employer].

## 2021-01-16 NOTE — Telephone Encounter (Signed)
PCP consult regarding CCM 01/16/21:  Reviewed drug interactions (risk X - gemfibrozil and pravastatin) and CV history with patient. Would like to lower his number of meds if possible. Recommend switching pravastatin to high intensity - rosuvastatin 20 mg and trial off gemfibrozil. He does have history of TG > 500 so would recommend rechecking lipid panel in 4-12 weeks.  Let me know your thoughts, thanks!  Debbora Dus, PharmD Clinical Pharmacist [facility] at [facility] [phone]

## 2021-01-17 NOTE — Telephone Encounter (Signed)
Patient aware of medication changes and has f/u in office tomorrow. He will call back to make 6 wk lab appt

## 2021-01-18 ENCOUNTER — Encounter: Payer: Self-pay | Admitting: Family Medicine

## 2021-01-18 ENCOUNTER — Other Ambulatory Visit: Payer: Self-pay

## 2021-01-18 ENCOUNTER — Ambulatory Visit (INDEPENDENT_AMBULATORY_CARE_PROVIDER_SITE_OTHER): Payer: Medicare Other | Admitting: Family Medicine

## 2021-01-18 DIAGNOSIS — E7800 Pure hypercholesterolemia, unspecified: Secondary | ICD-10-CM

## 2021-01-18 DIAGNOSIS — R042 Hemoptysis: Secondary | ICD-10-CM

## 2021-01-18 MED ORDER — BENZONATATE 200 MG PO CAPS: 200.0000 mg | ORAL_CAPSULE | Freq: Three times a day (TID) | ORAL | 1 refills | Status: DC | PRN

## 2021-01-18 MED ORDER — AMOXICILLIN-POT CLAVULANATE 875-125 MG PO TABS: 1.0000 | ORAL_TABLET | Freq: Two times a day (BID) | ORAL | 0 refills | Status: DC

## 2021-01-18 NOTE — Patient Instructions (Addendum)
Okay to start crestor but if you have aches, then stop it and let me know.   Use inhaler and tessalon if needed for the cough.    Start augmentin.   Restart xarelto and aspirin when bleeding is resolved.    If still with any blood seen next week, then let me know.    Take care.  Glad to see you.

## 2021-01-18 NOTE — Progress Notes (Signed)
This visit occurred during the SARS-CoV-2 public health emergency.  Safety protocols were in place, including screening questions prior to the visit, additional usage of staff PPE, and extensive cleaning of exam room while observing appropriate contact time as indicated for disinfecting solutions.  Hemoptysis follow-up.  We talked about CBD gummies.  Unclear if that was causative, but I had advised to stop it for now.    He isn't back on aspirin and xarelto yet.  No more sputum that is frankly bloody.  He still has some brown sputum but it is less than prev.  No bright red blood.    He feels "pretty good."  He clearly felt better after prednisone use, his back pain improved on med.    He had prev CT scan w/o mass seen 11/2020.  CXR w/o sig change 12/2020.  Tesslon helped cough.  He has used albuterol, noted improvement with use.  He has more sinus pressure in the last few days but is still overall improved from a cough standpoint.    He isn't yet on crestor.  Statin cautions d/w pt.  Pharmacy didn't have med ready for pick up yet.  Discussed.  Meds, vitals, and allergies reviewed.   ROS: Per HPI unless specifically indicated in ROS section   GEN: nad, alert and oriented HEENT: ncat, R max sinus mildly ttp but other sinuses not tender x3.  Right nostril with superficial irritation internally with a small area of old clotted blood noted.  No stigmata of bleeding in the left nostril. NECK: supple w/o LA CV: rrr.  PULM: ctab, no inc wob ABD: soft, +bs EXT: no edema SKIN: No acute rash.  30 minutes were devoted to patient care in this encounter (this includes time spent reviewing the patient's file/history, interviewing and examining the patient, counseling/reviewing plan with patient).

## 2021-01-20 NOTE — Assessment & Plan Note (Signed)
He will start Crestor but if any aches he will update me.

## 2021-01-20 NOTE — Assessment & Plan Note (Signed)
Discussed options.  He has recent imaging that did not have any lung mass noted.  This is reassuring.  He could have had hemoptysis related to postnasal drip from a benign lesion in his nasal passages.  See exam above.  At this point, would use inhaler and tessalon if needed for the cough.  Start augmentin given the sinus tenderness.  Restart xarelto and aspirin when bleeding is completely resolved.  Unclear if some of the recent brown sputum had old blood in it.  If still with any blood seen next week, then he will let me know.  He agrees with plan.

## 2021-01-21 NOTE — Progress Notes (Signed)
Carelink Summary Report / Loop Recorder 

## 2021-01-29 ENCOUNTER — Ambulatory Visit: Payer: Self-pay

## 2021-01-29 NOTE — Chronic Care Management (AMB) (Signed)
Provider portion of Xarelto assistance application has been faxed to [employer] 01/29/21. Patient portion mailed to patient 01/17/21.

## 2021-02-06 ENCOUNTER — Telehealth: Payer: Self-pay

## 2021-02-06 LAB — CUP PACEART REMOTE DEVICE CHECK
Date Time Interrogation Session: 20220214000803
Implantable Pulse Generator Implant Date: 20210525

## 2021-02-06 NOTE — Chronic Care Management (AMB) (Addendum)
Chronic Care Management Pharmacy Assistant   Name: Nathan Fernandez  MRN: [id number] DOB: [birth year]  Reason for Encounter: Patient Assistance Xarelto and Statin-Medication Follow Up   PCP : Nathan Ghent, MD  Allergies:   Allergies  Allergen Reactions   Ace Inhibitors Swelling    Swelling of the tongue   Angiotensin Receptor Blockers Swelling    Tongue swelling   Enalapril Swelling    Tongue swelling    Medications: Outpatient Encounter Medications as of 02/06/2021  Medication Sig   albuterol (VENTOLIN HFA) 108 (90 Base) MCG/ACT inhaler Inhale 1-2 puffs into the lungs every 6 (six) hours as needed for shortness of breath.   amoxicillin-clavulanate (AUGMENTIN) 875-125 MG tablet Take 1 tablet by mouth 2 (two) times daily.   aspirin 81 MG EC tablet Take 81 mg by mouth daily.   benzonatate (TESSALON) 200 MG capsule Take 1 capsule (200 mg total) by mouth 3 (three) times daily as needed.   buPROPion (WELLBUTRIN SR) 150 MG 12 hr tablet Take 1 tablet (150 mg total) by mouth daily.   carvedilol (COREG) 6.25 MG tablet Take 0.5 tablets (3.125 mg total) by mouth in the morning and at bedtime.   cyclobenzaprine (FLEXERIL) 5 MG tablet Take 1 tablet (5 mg total) by mouth 3 (three) times daily as needed for muscle spasms.   dorzolamide-timolol (COSOPT) 22.3-6.8 MG/ML ophthalmic solution Place 1 drop into both eyes 2 (two) times daily.   hydrALAZINE (APRESOLINE) 10 MG tablet Take 1 tablet (10 mg total) by mouth every 12 (twelve) hours.   isosorbide mononitrate (IMDUR) 30 MG 24 hr tablet TAKE 1/2 TABLET BY MOUTH EVERY DAY   ketoconazole (NIZORAL) 2 % cream Apply 1 application topically 2 (two) times daily as needed for irritation.   meclizine (ANTIVERT) 12.5 MG tablet Take 1-2 tablets (12.5-25 mg total) by mouth 3 (three) times daily as needed for dizziness.   nitroGLYCERIN (NITROSTAT) 0.4 MG SL tablet Place 1 tablet (0.4 mg total) under the tongue every 5 (five) minutes as needed for chest  pain. Up to 3 doses   Polyethyl Glycol-Propyl Glycol (SYSTANE) 0.4-0.3 % GEL ophthalmic gel Place 1 application into the left eye at bedtime.   rosuvastatin (CRESTOR) 20 MG tablet Take 1 tablet (20 mg total) by mouth daily.   traZODone (DESYREL) 100 MG tablet TAKE 2 TABLETS (200 MG TOTAL) BY MOUTH AT BEDTIME.   XARELTO 20 MG TABS tablet TAKE 1 TABLET BY MOUTH DAILY WITH SUPPER.   No facility-administered encounter medications on file as of 02/06/2021.    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   Hemoptysis 07/10/2019   Abnormal cardiovascular stress test    Tremor 09/02/2018   Advance care planning 09/02/2018   Cold intolerance 09/02/2018   Dry mouth 03/12/2018   Shoulder pain 01/05/2018   Hypoxia 12/29/2017   Wrist pain 12/29/2017   Lower back pain 02/18/2017   Lower urinary tract symptoms (LUTS) 02/11/2016   Bilateral knee pain 05/25/2015   Benign paroxysmal positional vertigo 02/18/2015   Abdominal wall symptom 02/18/2015   Gout 05/04/2014   Healthcare maintenance 04/18/2014   COPD (chronic obstructive pulmonary disease) ([place]) 03/25/2013   CAD (coronary artery disease) 03/03/2013   Other malaise and fatigue 02/22/2013   Chest wall pain 05/31/2012   Heartburn 05/07/2011   Depression 11/28/2010   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  Also wanted to follow up on Xarelto application for  patient assistance . Application was faxed to [facility] and [facility] 01/29/21. Wanted to see if patient has heard from [place] and [place]. Patient did not answer 02/06/21, left message to return call.  Contacted patient again on 02/12/21 to attempted to discuss above items and wife answered. She stated she took patient to ED last night for suspected MI. Patient was admitted as ED felt like he was having heart attack as well. He is scheduled for heart cath tomorrow 02/13/21. Patient also tested positive for Covid-19 while in the ED. Patient's wife states he is tolerating the rosuvastatin well. He was tired the first few days of taking it but has started to gain more energy. She is not aware of patient assistance application status at this time for Xarelto. Patients wife also asked for Dr. Damita Dunnings to refill the nitroglycerin as the bottle they had at home was old. Informed her I would relay the request.   Follow-Up:  Patient Assistance Coordination and Pharmacist Review  Debbora Dus, CPP notified  Margaretmary Dys, [place] Assistant [phone]  I have reviewed the care management and care coordination activities outlined in this encounter and I am certifying that I agree with the content of this note.   Debbora Dus, PharmD Clinical Pharmacist [facility] at [facility] [phone]

## 2021-02-11 ENCOUNTER — Emergency Department (HOSPITAL_BASED_OUTPATIENT_CLINIC_OR_DEPARTMENT_OTHER): Payer: Medicare Other

## 2021-02-11 ENCOUNTER — Encounter (HOSPITAL_BASED_OUTPATIENT_CLINIC_OR_DEPARTMENT_OTHER): Payer: Self-pay | Admitting: Emergency Medicine

## 2021-02-11 ENCOUNTER — Other Ambulatory Visit: Payer: Self-pay

## 2021-02-11 ENCOUNTER — Inpatient Hospital Stay (HOSPITAL_BASED_OUTPATIENT_CLINIC_OR_DEPARTMENT_OTHER)
Admission: EM | Admit: 2021-02-11 | Discharge: 2021-02-14 | DRG: 280 | Disposition: A | Discharge status: Home / Self Care | Payer: Medicare Other | Attending: Cardiology | Admitting: Cardiology

## 2021-02-11 ENCOUNTER — Ambulatory Visit (INDEPENDENT_AMBULATORY_CARE_PROVIDER_SITE_OTHER): Payer: Medicare Other

## 2021-02-11 DIAGNOSIS — I25110 Atherosclerotic heart disease of native coronary artery with unstable angina pectoris: Secondary | ICD-10-CM | POA: Diagnosis present

## 2021-02-11 DIAGNOSIS — Z7901 Long term (current) use of anticoagulants: Secondary | ICD-10-CM | POA: Diagnosis not present

## 2021-02-11 DIAGNOSIS — Z888 Allergy status to other drugs, medicaments and biological substances status: Secondary | ICD-10-CM | POA: Diagnosis not present

## 2021-02-11 DIAGNOSIS — E119 Type 2 diabetes mellitus without complications: Secondary | ICD-10-CM | POA: Diagnosis not present

## 2021-02-11 DIAGNOSIS — I252 Old myocardial infarction: Secondary | ICD-10-CM

## 2021-02-11 DIAGNOSIS — R079 Chest pain, unspecified: Secondary | ICD-10-CM | POA: Diagnosis not present

## 2021-02-11 DIAGNOSIS — E7800 Pure hypercholesterolemia, unspecified: Secondary | ICD-10-CM | POA: Diagnosis not present

## 2021-02-11 DIAGNOSIS — Z7982 Long term (current) use of aspirin: Secondary | ICD-10-CM | POA: Diagnosis not present

## 2021-02-11 DIAGNOSIS — Z87891 Personal history of nicotine dependence: Secondary | ICD-10-CM

## 2021-02-11 DIAGNOSIS — Z8249 Family history of ischemic heart disease and other diseases of the circulatory system: Secondary | ICD-10-CM

## 2021-02-11 DIAGNOSIS — R0789 Other chest pain: Secondary | ICD-10-CM | POA: Diagnosis not present

## 2021-02-11 DIAGNOSIS — Z823 Family history of stroke: Secondary | ICD-10-CM

## 2021-02-11 DIAGNOSIS — I639 Cerebral infarction, unspecified: Secondary | ICD-10-CM | POA: Diagnosis not present

## 2021-02-11 DIAGNOSIS — U071 COVID-19: Secondary | ICD-10-CM | POA: Diagnosis present

## 2021-02-11 DIAGNOSIS — Z79899 Other long term (current) drug therapy: Secondary | ICD-10-CM | POA: Diagnosis not present

## 2021-02-11 DIAGNOSIS — E785 Hyperlipidemia, unspecified: Secondary | ICD-10-CM | POA: Diagnosis present

## 2021-02-11 DIAGNOSIS — I110 Hypertensive heart disease with heart failure: Secondary | ICD-10-CM | POA: Diagnosis present

## 2021-02-11 DIAGNOSIS — I5042 Chronic combined systolic (congestive) and diastolic (congestive) heart failure: Secondary | ICD-10-CM | POA: Diagnosis present

## 2021-02-11 DIAGNOSIS — I2510 Atherosclerotic heart disease of native coronary artery without angina pectoris: Secondary | ICD-10-CM | POA: Diagnosis not present

## 2021-02-11 DIAGNOSIS — I214 Non-ST elevation (NSTEMI) myocardial infarction: Principal | ICD-10-CM | POA: Diagnosis present

## 2021-02-11 DIAGNOSIS — Z8673 Personal history of transient ischemic attack (TIA), and cerebral infarction without residual deficits: Secondary | ICD-10-CM | POA: Diagnosis not present

## 2021-02-11 DIAGNOSIS — I10 Essential (primary) hypertension: Secondary | ICD-10-CM | POA: Diagnosis not present

## 2021-02-11 DIAGNOSIS — J449 Chronic obstructive pulmonary disease, unspecified: Secondary | ICD-10-CM | POA: Diagnosis not present

## 2021-02-11 DIAGNOSIS — J439 Emphysema, unspecified: Secondary | ICD-10-CM | POA: Diagnosis not present

## 2021-02-11 LAB — COMPREHENSIVE METABOLIC PANEL
ALT: 21 U/L (ref 0–44)
AST: 26 U/L (ref 15–41)
Albumin: 3.5 g/dL (ref 3.5–5.0)
Alkaline Phosphatase: 53 U/L (ref 38–126)
Anion gap: 9 (ref 5–15)
BUN: 12 mg/dL (ref 8–23)
CO2: 27 mmol/L (ref 22–32)
Calcium: 8.5 mg/dL — ABNORMAL LOW (ref 8.9–10.3)
Chloride: 99 mmol/L (ref 98–111)
Creatinine, Ser: 0.88 mg/dL (ref 0.61–1.24)
GFR, Estimated: >60 mL/min (ref >60)
Glucose, Bld: 233 mg/dL — ABNORMAL HIGH (ref 70–99)
Potassium: 4.3 mmol/L (ref 3.5–5.1)
Sodium: 135 mmol/L (ref 135–145)
Total Bilirubin: 0.3 mg/dL (ref 0.3–1.2)
Total Protein: 6.7 g/dL (ref 6.5–8.1)

## 2021-02-11 LAB — CBC WITH DIFFERENTIAL/PLATELET
Abs Immature Granulocytes: 0.03 10*3/uL (ref 0.00–0.07)
Basophils Absolute: 0.1 10*3/uL (ref 0.0–0.1)
Basophils Relative: 1 %
Eosinophils Absolute: 0.4 10*3/uL (ref 0.0–0.5)
Eosinophils Relative: 4 %
HCT: 45.7 % (ref 39.0–52.0)
Hemoglobin: 15.3 g/dL (ref 13.0–17.0)
Immature Granulocytes: 0 %
Lymphocytes Relative: 22 %
Lymphs Abs: 2 10*3/uL (ref 0.7–4.0)
MCH: 30.6 pg (ref 26.0–34.0)
MCHC: 33.5 g/dL (ref 30.0–36.0)
MCV: 91.4 fL (ref 80.0–100.0)
Monocytes Absolute: 0.7 10*3/uL (ref 0.1–1.0)
Monocytes Relative: 8 %
Neutro Abs: 5.7 10*3/uL (ref 1.7–7.7)
Neutrophils Relative %: 65 %
Platelets: 203 10*3/uL (ref 150–400)
RBC: 5 MIL/uL (ref 4.22–5.81)
RDW: 13 % (ref 11.5–15.5)
WBC: 8.9 10*3/uL (ref 4.0–10.5)
nRBC: 0 % (ref 0.0–0.2)

## 2021-02-11 LAB — TROPONIN I (HIGH SENSITIVITY)
Troponin I (High Sensitivity): 19 ng/L — ABNORMAL HIGH (ref <18)
Troponin I (High Sensitivity): 718 ng/L (ref <18)

## 2021-02-11 NOTE — ED Triage Notes (Signed)
Pt c/o intermittent chest pain since 1700. Denies any shob. Pt reports episode of nausea.

## 2021-02-11 NOTE — ED Provider Notes (Addendum)
Nursing notes and vitals signs, including pulse oximetry, reviewed.  Summary of this visit's results, reviewed by myself:  EKG:  EKG Interpretation  Date/Time:  Monday February 11 2021 20:54:03 EST Ventricular Rate:  77 PR Interval:    QRS Duration: 148 QT Interval:  439 QTC Calculation: 497 R Axis:   52 Text Interpretation: Sinus rhythm Prolonged PR interval Right bundle branch block Confirmed by Madalyn Rob [phone]) on 02/11/2021 9:01:42 PM       Labs:  Results for orders placed or performed during the hospital encounter of 02/11/21 (from the past 24 hour(s))  CBC with Differential     Status: None   Collection Time: 02/11/21  8:52 PM  Result Value Ref Range   WBC 8.9 4.0 - 10.5 K/uL   RBC 5.00 4.22 - 5.81 MIL/uL   Hemoglobin 15.3 13.0 - 17.0 g/dL   HCT 45.7 39.0 - 52.0 %   MCV 91.4 80.0 - 100.0 fL   MCH 30.6 26.0 - 34.0 pg   MCHC 33.5 30.0 - 36.0 g/dL   RDW 13.0 11.5 - 15.5 %   Platelets 203 150 - 400 K/uL   nRBC 0.0 0.0 - 0.2 %   Neutrophils Relative % 65 %   Neutro Abs 5.7 1.7 - 7.7 K/uL   Lymphocytes Relative 22 %   Lymphs Abs 2.0 0.7 - 4.0 K/uL   Monocytes Relative 8 %   Monocytes Absolute 0.7 0.1 - 1.0 K/uL   Eosinophils Relative 4 %   Eosinophils Absolute 0.4 0.0 - 0.5 K/uL   Basophils Relative 1 %   Basophils Absolute 0.1 0.0 - 0.1 K/uL   Immature Granulocytes 0 %   Abs Immature Granulocytes 0.03 0.00 - 0.07 K/uL  Comprehensive metabolic panel     Status: Abnormal   Collection Time: 02/11/21  8:52 PM  Result Value Ref Range   Sodium 135 135 - 145 mmol/L   Potassium 4.3 3.5 - 5.1 mmol/L   Chloride 99 98 - 111 mmol/L   CO2 27 22 - 32 mmol/L   Glucose, Bld 233 (H) 70 - 99 mg/dL   BUN 12 8 - 23 mg/dL   Creatinine, Ser 0.88 0.61 - 1.24 mg/dL   Calcium 8.5 (L) 8.9 - 10.3 mg/dL   Total Protein 6.7 6.5 - 8.1 g/dL   Albumin 3.5 3.5 - 5.0 g/dL   AST 26 15 - 41 U/L   ALT 21 0 - 44 U/L   Alkaline Phosphatase 53 38 - 126 U/L   Total Bilirubin 0.3 0.3 -  1.2 mg/dL   GFR, Estimated >60 >60 mL/min   Anion gap 9 5 - 15  Troponin I (High Sensitivity)     Status: Abnormal   Collection Time: 02/11/21  8:52 PM  Result Value Ref Range   Troponin I (High Sensitivity) 19 (H) <18 ng/L  Troponin I (High Sensitivity)     Status: Abnormal   Collection Time: 02/11/21 11:07 PM  Result Value Ref Range   Troponin I (High Sensitivity) 718 (HH) <18 ng/L  Resp Panel by RT-PCR (Flu A&B, Covid) Nasopharyngeal Swab     Status: Abnormal   Collection Time: 02/12/21 12:19 AM   Specimen: Nasopharyngeal Swab; Nasopharyngeal(NP) swabs in vial transport medium  Result Value Ref Range   SARS Coronavirus 2 by RT PCR POSITIVE (A) NEGATIVE   Influenza A by PCR NEGATIVE NEGATIVE   Influenza B by PCR NEGATIVE NEGATIVE    Imaging Studies: DG Chest Portable 1 View  Result Date:  02/11/2021 CLINICAL DATA:  Chest pain EXAM: PORTABLE CHEST 1 VIEW COMPARISON:  12/31/2020, 11/26/2020 FINDINGS: Emphysematous disease and bronchitic changes. No consolidation or effusion. Stable cardiomediastinal silhouette. Recording device over left chest. No pneumothorax. IMPRESSION: Emphysematous disease and bronchitic changes. Electronically Signed   By: Donavan Foil M.D.   On: 02/11/2021 21:38   12:14 AM Dr. Rondel Baton accepts for admission to cardiology service. Heparin bolus and drip ordered.  1:05 AM Pharmacy recommends against heparin as patient is already on Xarelto.   CRITICAL CARE Performed by: Karen Chafe Keyontay Stolz Total critical care time: 30 minutes Critical care time was exclusive of separately billable procedures and treating other patients. Critical care was necessary to treat or prevent imminent or life-threatening deterioration. Critical care was time spent personally by me on the following activities: development of treatment plan with patient and/or surrogate as well as nursing, discussions with consultants, evaluation of patient's response to treatment, examination of patient,  obtaining history from patient or surrogate, ordering and performing treatments and interventions, ordering and review of laboratory studies, ordering and review of radiographic studies, pulse oximetry and re-evaluation of patient's condition.   Shanon Rosser, MD 02/12/21 0014    Shanon Rosser, MD 02/12/21 0105    Shanon Rosser, MD 02/12/21 [phone]

## 2021-02-11 NOTE — ED Provider Notes (Signed)
[facility] Provider Note   CSN: [id number] Arrival date & time: 02/11/21  2042     History Chief Complaint  Patient presents with  . Chest Pain    Nathan Fernandez is a 80 y.o. male.  History of coronary artery disease, catheter and 2020 showed stable disease and recommending medical therapy.  Also has history of hypertension, diabetes, hyperlipidemia, COPD.  He reports he was in his normal state of health, asymptomatic throughout the day today.  Around 5:00 or so he started having central chest discomfort, described as indigestion, moderate in severity.  Radiated up to his jaw and down towards his stomach.  This lasted for couple hours but by the time he checked into the ER it had completely resolved.  He has no symptoms at present.  There was no associated shortness of breath, abdominal pain, vomiting or fever.  Per cath report Complex CAD as described above with no clear change in anatomy since cath in 2014. I do not see any focal targets for PCI. I would recommend medical management of his CAD.    HPI     Past Medical History:  Diagnosis Date  . 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   Hosp R facial weakness  . CAD (coronary artery disease)    MI, [place], PTCA; [place] 04/18/13: Distal left main 40%, proximal LAD with several aneurysmal segments, proximal LAD 50% prior to and after aneurysmal segments, area does not appear to flow-limiting, proximal diagonal 70%, ostial circumflex 20%, proximal circumflex 20%, OM1 30-40%, proximal RCA 40%, mid RCA 30%, distal RCA 20%, EF 50% => med Rx  . COPD (chronic obstructive pulmonary disease) ([place])   . DM2 (diabetes mellitus, type 2) ([place])   . History of ETT 1998   wnl  . History of MRI 08/04/09   brain- atrophy sm vess dz  . HLD (hyperlipidemia)   . HTN (hypertension)   . OA (osteoarthritis)     Patient Active Problem List   Diagnosis Date Noted  . Long term (current) use of anticoagulants 05/27/2020  . 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 Hemoptysis 07/10/2019  . Abnormal cardiovascular stress test   . Tremor 09/02/2018  . Advance care planning 09/02/2018  . Cold intolerance 09/02/2018  . Dry mouth 03/12/2018  . Shoulder pain 01/05/2018  . Hypoxia 12/29/2017  . Wrist pain 12/29/2017  . Lower back pain 02/18/2017  . Lower urinary tract symptoms (LUTS) 02/11/2016  . Bilateral knee pain 05/25/2015  . Benign paroxysmal positional vertigo 02/18/2015  . Abdominal wall symptom 02/18/2015  . Gout 05/04/2014  . Healthcare maintenance 04/18/2014  . COPD (chronic obstructive pulmonary disease) ([place]) 03/25/2013  . CAD (coronary artery disease) 03/03/2013  . Other malaise and fatigue 02/22/2013  . Chest wall pain 05/31/2012  . Heartburn 05/07/2011  . Depression 11/28/2010  . 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 Diabetes mellitus ([place]) 03/18/2010  . 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 HYPERCHOLESTEROLEMIA 10/19/2007  . 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 HYPERTENSION, BENIGN 10/19/2007  . ROSACEA 10/19/2007  . Osteoarthritis 10/19/2007  . Insomnia 10/19/2007    Past Surgical History:  Procedure Laterality Date  . BUBBLE STUDY  05/15/2020   Procedure: BUBBLE STUDY;  Surgeon: Pixie Casino, MD;  Location: [facility] ENDOSCOPY;  Service: Cardiovascular;;  . cataract surgery  09/06   repair lens which moved  . EYE SURGERY    . LEFT HEART CATH  AND CORONARY ANGIOGRAPHY N/A 01/21/2019   Procedure: LEFT HEART CATH AND CORONARY ANGIOGRAPHY;  Surgeon: Burnell Blanks, MD;  Location: [facility] [facility] LAB;  Service: Cardiovascular;  Laterality: N/A;  . LOOP RECORDER INSERTION N/A 05/15/2020   Procedure: LOOP RECORDER INSERTION;  Surgeon: Thompson Grayer, MD;  Location: [place] [facility] LAB;  Service: Cardiovascular;  Laterality: N/A;  . stress myoview  06/29/06   sm distal anteroseptal & apical infarct  . TEE  WITHOUT CARDIOVERSION N/A 05/15/2020   Procedure: TRANSESOPHAGEAL ECHOCARDIOGRAM (TEE);  Surgeon: Pixie Casino, MD;  Location: [facility] ENDOSCOPY;  Service: Cardiovascular;  Laterality: N/A;  . THYROIDECTOMY, PARTIAL  1967   B9 growth       Family History  Problem Relation Age of Onset  . Hypertension Mother   . Aneurysm Mother   . Stroke Mother   . Hypertension Father   . Heart disease Father        CAD  . Heart disease Son        MI  . Colon cancer Neg Hx   . Prostate cancer Neg Hx     Social History   Tobacco Use  . Smoking status: Former Smoker    Types: Cigars  . Smokeless tobacco: Never Used  . Tobacco comment: cigar occassionally  Vaping Use  . Vaping Use: Never used  Substance Use Topics  . Alcohol use: Not Currently    Alcohol/week: 0.0 standard drinks  . Drug use: No    Home Medications Prior to Admission medications   Medication Sig Start Date End Date Taking? Authorizing Provider  albuterol (VENTOLIN HFA) 108 (90 Base) MCG/ACT inhaler Inhale 1-2 puffs into the lungs every 6 (six) hours as needed for shortness of breath. 09/06/19   Tonia Ghent, MD  amoxicillin-clavulanate (AUGMENTIN) 875-125 MG tablet Take 1 tablet by mouth 2 (two) times daily. 01/18/21   Tonia Ghent, MD  aspirin 81 MG EC tablet Take 81 mg by mouth daily.    [provider]  benzonatate (TESSALON) 200 MG capsule Take 1 capsule (200 mg total) by mouth 3 (three) times daily as needed. 01/18/21   Tonia Ghent, MD  buPROPion [facility] SR) 150 MG 12 hr tablet Take 1 tablet (150 mg total) by mouth daily. 09/07/20   Tonia Ghent, MD  carvedilol (COREG) 6.25 MG tablet Take 0.5 tablets (3.125 mg total) by mouth in the morning and at bedtime. 09/07/20   Tonia Ghent, MD  cyclobenzaprine (FLEXERIL) 5 MG tablet Take 1 tablet (5 mg total) by mouth 3 (three) times daily as needed for muscle spasms. 07/07/19   Tonia Ghent, MD  dorzolamide-timolol (COSOPT) 22.3-6.8 MG/ML  ophthalmic solution Place 1 drop into both eyes 2 (two) times daily. 02/13/20   [provider]  gemfibrozil (LOPID) 600 MG tablet  01/23/21   [provider]  hydrALAZINE (APRESOLINE) 10 MG tablet Take 1 tablet (10 mg total) by mouth every 12 (twelve) hours. 05/02/20 05/02/21  Richardson Dopp T, PA-C  isosorbide mononitrate (IMDUR) 30 MG 24 hr tablet TAKE 1/2 TABLET BY MOUTH EVERY DAY 05/07/20   Josue Hector, MD  ketoconazole (NIZORAL) 2 % cream Apply 1 application topically 2 (two) times daily as needed for irritation. 03/11/18   Tonia Ghent, MD  meclizine (ANTIVERT) 12.5 MG tablet Take 1-2 tablets (12.5-25 mg total) by mouth 3 (three) times daily as needed for dizziness. 02/16/15   Tonia Ghent, MD  nitroGLYCERIN (NITROSTAT) 0.4 MG SL tablet  Place 1 tablet (0.4 mg total) under the tongue every 5 (five) minutes as needed for chest pain. Up to 3 doses 02/11/19   Josue Hector, MD  Polyethyl Glycol-Propyl Glycol (SYSTANE) 0.4-0.3 % GEL ophthalmic gel Place 1 application into the left eye at bedtime.    [provider]  rosuvastatin (CRESTOR) 20 MG tablet Take 1 tablet (20 mg total) by mouth daily. 01/16/21   Tonia Ghent, MD  traZODone (DESYREL) 100 MG tablet TAKE 2 TABLETS (200 MG TOTAL) BY MOUTH AT BEDTIME. 01/02/21   Tonia Ghent, MD  XARELTO 20 MG TABS tablet TAKE 1 TABLET BY MOUTH DAILY WITH SUPPER. 01/03/21   Brunetta Genera, MD    Allergies    Ace inhibitors, Angiotensin receptor blockers, and Enalapril  Review of Systems   Review of Systems  Constitutional: Negative for chills and fever.  HENT: Negative for ear pain and sore throat.   Eyes: Negative for pain and visual disturbance.  Respiratory: Positive for chest tightness. Negative for cough and shortness of breath.   Cardiovascular: Positive for chest pain. Negative for palpitations.  Gastrointestinal: Negative for abdominal pain and vomiting.  Genitourinary: Negative for dysuria and  hematuria.  Musculoskeletal: Negative for arthralgias and back pain.  Skin: Negative for color change and rash.  Neurological: Negative for seizures and syncope.  All other systems reviewed and are negative.   Physical Exam Updated Vital Signs BP (!) 148/84 (BP Location: Left Arm)   Pulse 69   Temp 97.9 F (36.6 C) (Oral)   Resp 19   Ht 5\' 9"  (1.753 m)   Wt 74.8 kg   SpO2 92%   BMI 24.37 kg/m   Physical Exam Vitals and nursing note reviewed.  Constitutional:      Appearance: He is well-developed and well-nourished.  HENT:     Head: Normocephalic and atraumatic.  Eyes:     Conjunctiva/sclera: Conjunctivae normal.  Cardiovascular:     Rate and Rhythm: Normal rate and regular rhythm.     Heart sounds: No murmur heard.   Pulmonary:     Effort: Pulmonary effort is normal. No respiratory distress.     Breath sounds: Normal breath sounds.  Abdominal:     Palpations: Abdomen is soft.     Tenderness: There is no abdominal tenderness.  Musculoskeletal:        General: No edema.     Cervical back: Neck supple.     Right lower leg: No edema.     Left lower leg: No edema.  Skin:    General: Skin is warm and dry.     Capillary Refill: Capillary refill takes less than 2 seconds.  Neurological:     General: No focal deficit present.     Mental Status: He is alert and oriented to person, place, and time.  Psychiatric:        Mood and Affect: Mood and affect normal.     ED Results / Procedures / Treatments   Labs (all labs ordered are listed, but only abnormal results are displayed) Labs Reviewed  COMPREHENSIVE METABOLIC PANEL - Abnormal; Notable for the following components:      Result Value   Glucose, Bld 233 (*)    Calcium 8.5 (*)    All other components within normal limits  TROPONIN I (HIGH SENSITIVITY) - Abnormal; Notable for the following components:   Troponin I (High Sensitivity) 19 (*)    All other components within normal limits  CBC WITH  DIFFERENTIAL/PLATELET  TROPONIN I (HIGH SENSITIVITY)    EKG EKG Interpretation  Date/Time:  Monday February 11 2021 20:54:03 EST Ventricular Rate:  77 PR Interval:    QRS Duration: 148 QT Interval:  439 QTC Calculation: 497 R Axis:   52 Text Interpretation: Sinus rhythm Prolonged PR interval Right bundle branch block Confirmed by Madalyn Rob [phone]) on 02/11/2021 9:01:42 PM   Radiology DG Chest Portable 1 View  Result Date: 02/11/2021 CLINICAL DATA:  Chest pain EXAM: PORTABLE CHEST 1 VIEW COMPARISON:  12/31/2020, 11/26/2020 FINDINGS: Emphysematous disease and bronchitic changes. No consolidation or effusion. Stable cardiomediastinal silhouette. Recording device over left chest. No pneumothorax. IMPRESSION: Emphysematous disease and bronchitic changes. Electronically Signed   By: Donavan Foil M.D.   On: 02/11/2021 21:38    Procedures Procedures   Medications Ordered in ED Medications - No data to display  ED Course  I have reviewed the triage vital signs and the nursing notes.  Pertinent labs & imaging results that were available during my care of the patient were reviewed by me and considered in my medical decision making (see chart for details).    MDM Rules/Calculators/A&P                         80 year old male with history of coronary artery disease presents to ER with concern for episode of chest pain.  On exam, patient is well-appearing and has no ongoing symptoms.  His EKG does not have any acute ischemic change.  His initial troponin was 19.  CXR negative for acute process.    Will need second troponin.  If troponin increasing, raised concern for ACS.  If troponin is unchanged and patient remains symptom-free, believe he can be discharged with plan for continued medical management and follow-up with his primary doctor and cardiologist.  While awaiting second troponin and further observation, signed out to Dr. Florina Ou.   Final Clinical Impression(s) / ED  Diagnoses Final diagnoses:  Chest pain, unspecified type    Rx / DC Orders ED Discharge Orders    None       Lucrezia Starch, MD 02/11/21 2336

## 2021-02-12 ENCOUNTER — Observation Stay (HOSPITAL_BASED_OUTPATIENT_CLINIC_OR_DEPARTMENT_OTHER): Payer: Medicare Other

## 2021-02-12 ENCOUNTER — Telehealth: Payer: Self-pay

## 2021-02-12 DIAGNOSIS — J449 Chronic obstructive pulmonary disease, unspecified: Secondary | ICD-10-CM | POA: Diagnosis not present

## 2021-02-12 DIAGNOSIS — I110 Hypertensive heart disease with heart failure: Secondary | ICD-10-CM | POA: Diagnosis not present

## 2021-02-12 DIAGNOSIS — E785 Hyperlipidemia, unspecified: Secondary | ICD-10-CM | POA: Diagnosis not present

## 2021-02-12 DIAGNOSIS — Z7901 Long term (current) use of anticoagulants: Secondary | ICD-10-CM | POA: Diagnosis not present

## 2021-02-12 DIAGNOSIS — Z87891 Personal history of nicotine dependence: Secondary | ICD-10-CM | POA: Diagnosis not present

## 2021-02-12 DIAGNOSIS — Z823 Family history of stroke: Secondary | ICD-10-CM | POA: Diagnosis not present

## 2021-02-12 DIAGNOSIS — Z8249 Family history of ischemic heart disease and other diseases of the circulatory system: Secondary | ICD-10-CM | POA: Diagnosis not present

## 2021-02-12 DIAGNOSIS — R079 Chest pain, unspecified: Secondary | ICD-10-CM

## 2021-02-12 DIAGNOSIS — I252 Old myocardial infarction: Secondary | ICD-10-CM | POA: Diagnosis not present

## 2021-02-12 DIAGNOSIS — E119 Type 2 diabetes mellitus without complications: Secondary | ICD-10-CM | POA: Diagnosis not present

## 2021-02-12 DIAGNOSIS — Z7982 Long term (current) use of aspirin: Secondary | ICD-10-CM | POA: Diagnosis not present

## 2021-02-12 DIAGNOSIS — E7800 Pure hypercholesterolemia, unspecified: Secondary | ICD-10-CM

## 2021-02-12 DIAGNOSIS — I10 Essential (primary) hypertension: Secondary | ICD-10-CM | POA: Diagnosis not present

## 2021-02-12 DIAGNOSIS — U071 COVID-19: Secondary | ICD-10-CM | POA: Diagnosis present

## 2021-02-12 DIAGNOSIS — Z8673 Personal history of transient ischemic attack (TIA), and cerebral infarction without residual deficits: Secondary | ICD-10-CM | POA: Diagnosis not present

## 2021-02-12 DIAGNOSIS — I5042 Chronic combined systolic (congestive) and diastolic (congestive) heart failure: Secondary | ICD-10-CM | POA: Diagnosis not present

## 2021-02-12 DIAGNOSIS — I25110 Atherosclerotic heart disease of native coronary artery with unstable angina pectoris: Secondary | ICD-10-CM | POA: Diagnosis not present

## 2021-02-12 DIAGNOSIS — Z888 Allergy status to other drugs, medicaments and biological substances status: Secondary | ICD-10-CM | POA: Diagnosis not present

## 2021-02-12 DIAGNOSIS — Z79899 Other long term (current) drug therapy: Secondary | ICD-10-CM | POA: Diagnosis not present

## 2021-02-12 DIAGNOSIS — I214 Non-ST elevation (NSTEMI) myocardial infarction: Secondary | ICD-10-CM | POA: Diagnosis present

## 2021-02-12 LAB — TROPONIN I (HIGH SENSITIVITY)
Troponin I (High Sensitivity): 3329 ng/L (ref <18)
Troponin I (High Sensitivity): 3350 ng/L (ref <18)

## 2021-02-12 LAB — ECHOCARDIOGRAM COMPLETE
Area-P 1/2: 3.45 cm2
Calc EF: 44 %
Height: 69 in
S' Lateral: 3.5 cm
Single Plane A2C EF: 36 %
Single Plane A4C EF: 55.7 %
Weight: 2640 oz

## 2021-02-12 LAB — RESP PANEL BY RT-PCR (FLU A&B, COVID) ARPGX2
Influenza A by PCR: NEGATIVE
Influenza B by PCR: NEGATIVE
SARS Coronavirus 2 by RT PCR: POSITIVE — AB

## 2021-02-12 LAB — GLUCOSE, CAPILLARY
Glucose-Capillary: 146 mg/dL — ABNORMAL HIGH (ref 70–99)
Glucose-Capillary: 143 mg/dL — ABNORMAL HIGH (ref 70–99)
Glucose-Capillary: 208 mg/dL — ABNORMAL HIGH (ref 70–99)
Glucose-Capillary: 167 mg/dL — ABNORMAL HIGH (ref 70–99)

## 2021-02-12 LAB — BASIC METABOLIC PANEL
Anion gap: 11 (ref 5–15)
BUN: 9 mg/dL (ref 8–23)
CO2: 23 mmol/L (ref 22–32)
Calcium: 8.7 mg/dL — ABNORMAL LOW (ref 8.9–10.3)
Chloride: 104 mmol/L (ref 98–111)
Creatinine, Ser: 0.75 mg/dL (ref 0.61–1.24)
GFR, Estimated: >60 mL/min (ref >60)
Glucose, Bld: 166 mg/dL — ABNORMAL HIGH (ref 70–99)
Potassium: 4.1 mmol/L (ref 3.5–5.1)
Sodium: 138 mmol/L (ref 135–145)

## 2021-02-12 LAB — HEMOGLOBIN A1C
Hgb A1c MFr Bld: 7.9 % — ABNORMAL HIGH (ref 4.8–5.6)
Mean Plasma Glucose: 180.03 mg/dL

## 2021-02-12 LAB — TSH: TSH: 3.054 u[IU]/mL (ref 0.350–4.500)

## 2021-02-12 LAB — FIBRINOGEN: Fibrinogen: 381 mg/dL (ref 210–475)

## 2021-02-12 LAB — APTT: aPTT: 37 seconds — ABNORMAL HIGH (ref 24–36)

## 2021-02-12 LAB — C-REACTIVE PROTEIN: CRP: 0.7 mg/dL (ref <1.0)

## 2021-02-12 LAB — LACTATE DEHYDROGENASE: LDH: 171 U/L (ref 98–192)

## 2021-02-12 LAB — HEPARIN LEVEL (UNFRACTIONATED): Heparin Unfractionated: >2.2 IU/mL — ABNORMAL HIGH (ref 0.30–0.70)

## 2021-02-12 LAB — MAGNESIUM: Magnesium: 1.7 mg/dL (ref 1.7–2.4)

## 2021-02-12 LAB — FERRITIN: Ferritin: 847 ng/mL — ABNORMAL HIGH (ref 24–336)

## 2021-02-12 LAB — PROCALCITONIN: Procalcitonin: <0.1 ng/mL

## 2021-02-12 LAB — D-DIMER, QUANTITATIVE: D-Dimer, Quant: 1.08 ug/mL-FEU — ABNORMAL HIGH (ref 0.00–0.50)

## 2021-02-12 MED ORDER — ASPIRIN EC 81 MG PO TBEC
81.0000 mg | DELAYED_RELEASE_TABLET | Freq: Every day | ORAL | Status: DC
  Administered 2021-02-12 – 2021-02-14 (×2): 81 mg via ORAL
  Filled 2021-02-12 (×2): qty 1

## 2021-02-12 MED ORDER — ACETAMINOPHEN 325 MG PO TABS: 650.0000 mg | ORAL_TABLET | ORAL | Status: DC | PRN

## 2021-02-12 MED ORDER — NITROGLYCERIN 0.4 MG SL SUBL: 0.4000 mg | SUBLINGUAL_TABLET | SUBLINGUAL | Status: DC | PRN

## 2021-02-12 MED ORDER — SODIUM CHLORIDE 0.9 % IV SOLN
200.0000 mg | Freq: Once | INTRAVENOUS | Status: AC
  Administered 2021-02-12: 200 mg via INTRAVENOUS
  Filled 2021-02-12: qty 40

## 2021-02-12 MED ORDER — BUPROPION HCL ER (SR) 150 MG PO TB12
150.0000 mg | ORAL_TABLET | Freq: Every day | ORAL | Status: DC
  Administered 2021-02-12 – 2021-02-14 (×3): 150 mg via ORAL
  Filled 2021-02-12 (×3): qty 1

## 2021-02-12 MED ORDER — NITROGLYCERIN 0.4 MG SL SUBL: 0.4000 mg | SUBLINGUAL_TABLET | SUBLINGUAL | 3 refills | Status: DC | PRN

## 2021-02-12 MED ORDER — ROSUVASTATIN CALCIUM 20 MG PO TABS: 20.0000 mg | ORAL_TABLET | Freq: Every day | ORAL | Status: DC

## 2021-02-12 MED ORDER — ROSUVASTATIN CALCIUM 20 MG PO TABS
40.0000 mg | ORAL_TABLET | Freq: Every day | ORAL | Status: DC
  Administered 2021-02-12: 40 mg via ORAL
  Filled 2021-02-12: qty 2

## 2021-02-12 MED ORDER — POLYVINYL ALCOHOL 1.4 % OP SOLN
1.0000 [drp] | Freq: Every day | OPHTHALMIC | Status: DC
  Administered 2021-02-12 – 2021-02-13 (×2): 1 [drp] via OPHTHALMIC
  Filled 2021-02-12 (×2): qty 15

## 2021-02-12 MED ORDER — TRAZODONE HCL 100 MG PO TABS
200.0000 mg | ORAL_TABLET | Freq: Every day | ORAL | Status: DC
  Administered 2021-02-12 – 2021-02-13 (×2): 200 mg via ORAL
  Filled 2021-02-12: qty 4
  Filled 2021-02-12: qty 2

## 2021-02-12 MED ORDER — ALBUTEROL SULFATE HFA 108 (90 BASE) MCG/ACT IN AERS
1.0000 | INHALATION_SPRAY | Freq: Four times a day (QID) | RESPIRATORY_TRACT | Status: DC | PRN
  Filled 2021-02-12: qty 6.7

## 2021-02-12 MED ORDER — INSULIN ASPART 100 UNIT/ML ~~LOC~~ SOLN
0.0000 [IU] | Freq: Three times a day (TID) | SUBCUTANEOUS | Status: DC
  Administered 2021-02-13: 1 [IU] via SUBCUTANEOUS

## 2021-02-12 MED ORDER — BENZONATATE 100 MG PO CAPS: 200.0000 mg | ORAL_CAPSULE | Freq: Three times a day (TID) | ORAL | Status: DC | PRN

## 2021-02-12 MED ORDER — ASPIRIN 81 MG PO CHEW
81.0000 mg | CHEWABLE_TABLET | ORAL | Status: AC
  Administered 2021-02-13: 81 mg via ORAL
  Filled 2021-02-12: qty 1

## 2021-02-12 MED ORDER — SODIUM CHLORIDE 0.9 % IV SOLN: 250.0000 mL | INTRAVENOUS | Status: DC | PRN

## 2021-02-12 MED ORDER — INSULIN ASPART 100 UNIT/ML ~~LOC~~ SOLN: 0.0000 [IU] | Freq: Every day | SUBCUTANEOUS | Status: DC

## 2021-02-12 MED ORDER — ONDANSETRON HCL 4 MG/2ML IJ SOLN: 4.0000 mg | Freq: Four times a day (QID) | INTRAMUSCULAR | Status: DC | PRN

## 2021-02-12 MED ORDER — ISOSORBIDE MONONITRATE ER 30 MG PO TB24
15.0000 mg | ORAL_TABLET | Freq: Every day | ORAL | Status: DC
  Administered 2021-02-12 – 2021-02-14 (×3): 15 mg via ORAL
  Filled 2021-02-12 (×3): qty 1

## 2021-02-12 MED ORDER — DORZOLAMIDE HCL-TIMOLOL MAL 2-0.5 % OP SOLN
1.0000 [drp] | Freq: Two times a day (BID) | OPHTHALMIC | Status: DC
  Administered 2021-02-12 – 2021-02-14 (×5): 1 [drp] via OPHTHALMIC
  Filled 2021-02-12 (×2): qty 10

## 2021-02-12 MED ORDER — HYDRALAZINE HCL 10 MG PO TABS
10.0000 mg | ORAL_TABLET | Freq: Two times a day (BID) | ORAL | Status: DC
  Administered 2021-02-12 – 2021-02-14 (×5): 10 mg via ORAL
  Filled 2021-02-12 (×5): qty 1

## 2021-02-12 MED ORDER — SODIUM CHLORIDE 0.9% FLUSH: 3.0000 mL | INTRAVENOUS | Status: DC | PRN

## 2021-02-12 MED ORDER — SODIUM CHLORIDE 0.9% FLUSH
3.0000 mL | Freq: Two times a day (BID) | INTRAVENOUS | Status: DC
  Administered 2021-02-12 – 2021-02-14 (×5): 3 mL via INTRAVENOUS

## 2021-02-12 MED ORDER — GEMFIBROZIL 600 MG PO TABS
600.0000 mg | ORAL_TABLET | Freq: Two times a day (BID) | ORAL | Status: DC
  Administered 2021-02-12 – 2021-02-13 (×2): 600 mg via ORAL
  Filled 2021-02-12 (×7): qty 1

## 2021-02-12 MED ORDER — CARVEDILOL 3.125 MG PO TABS
3.1250 mg | ORAL_TABLET | Freq: Two times a day (BID) | ORAL | Status: DC
  Administered 2021-02-12 – 2021-02-14 (×5): 3.125 mg via ORAL
  Filled 2021-02-12 (×5): qty 1

## 2021-02-12 MED ORDER — HEPARIN (PORCINE) 25000 UT/250ML-% IV SOLN
1700.0000 [IU]/h | INTRAVENOUS | Status: DC
  Administered 2021-02-12: 1400 [IU]/h via INTRAVENOUS
  Administered 2021-02-13: 1700 [IU]/h via INTRAVENOUS
  Filled 2021-02-12 (×3): qty 250

## 2021-02-12 MED ORDER — SODIUM CHLORIDE 0.9 % IV SOLN: INTRAVENOUS | Status: DC

## 2021-02-12 MED ORDER — SODIUM CHLORIDE 0.9 % IV SOLN
100.0000 mg | Freq: Every day | INTRAVENOUS | Status: DC
  Administered 2021-02-13: 100 mg via INTRAVENOUS
  Filled 2021-02-12 (×2): qty 20

## 2021-02-12 NOTE — Plan of Care (Signed)
  Problem: Education: Goal: Knowledge of risk factors and measures for prevention of condition will improve Outcome: Progressing   Problem: Respiratory: Goal: Will maintain a patent airway Outcome: Progressing   Problem: Education: Goal: Knowledge of General Education information will improve Description: Including pain rating scale, medication(s)/side effects and non-pharmacologic comfort measures Outcome: Progressing   Problem: Clinical Measurements: Goal: Diagnostic test results will improve Outcome: Progressing

## 2021-02-12 NOTE — Telephone Encounter (Signed)
-----   Message from [place] sent at 02/12/2021  3:43 PM EST ----- Regarding: Refill Contact: [phone] Spoke with patient's wife. Patient currently in hospital for suspected MI. Patient's wife asked if Dr. Damita Dunnings would refill nitroglycerin as the bottle they have at home is very old.  Thank you, Margaretmary Dys.

## 2021-02-12 NOTE — Consult Note (Signed)
Medical Consultation   Nathan Fernandez  ZOX:[id number]  DOB: 11-27-1941  DOA: 02/11/2021  PCP: Tonia Ghent, MD   Outpatient Specialists:  Johnsie Cancel - cardiology; Irene Limbo - oncology   Requesting physician: Cardiology  Reason for consultation: NSTEMI, incidentally COVID positive.  Seen at UC last month with cough and chest congestion, not tested for COVID.  Symptoms improved since and no recent fever or infectious complaints or sick contacts.  Cath tomorrow.  History of Present Illness: Nathan Fernandez is an 80 y.o. male with h/o HTN; HLD; DM; cryptogenic CVA; CAD; and COPD who presented to [facility] with chest pain, admitted to cardiology for NSTEMI.  He reports that yesterday afternoon he developed substernal chest pressure that was similar to his prior index symptoms.  He is vaccinated but not boosted and denies any COVID symptoms.  He quit smoking 3 years ago and has chronic cough.  He was seen at [facility] for hemoptysis on 01/09/21 and had a clear CXR, treated as COPD exacerbation with prednisone and Albuterol/Tessalon (not COVID tested).  His cough resolved with the Tessalon and he has had none since.    Review of Systems:  ROS As per HPI otherwise 10 point review of systems negative.    Past Medical History: Past Medical History:  Diagnosis Date  . 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   Hosp R facial weakness  . CAD (coronary artery disease)    MI, [place], PTCA; [place] 04/18/13: Distal left main 40%, proximal LAD with several aneurysmal segments, proximal LAD 50% prior to and after aneurysmal segments, area does not appear to flow-limiting, proximal diagonal 70%, ostial circumflex 20%, proximal circumflex 20%, OM1 30-40%, proximal RCA 40%, mid RCA 30%, distal RCA 20%, EF 50% => med Rx  . COPD (chronic obstructive pulmonary disease) ([place])   . DM2 (diabetes mellitus, type 2) ([place])   . History of ETT 1998   wnl  . History of MRI 08/04/09   brain- atrophy sm vess dz  . HLD (hyperlipidemia)    . HTN (hypertension)   . OA (osteoarthritis)     Past Surgical History: Past Surgical History:  Procedure Laterality Date  . BUBBLE STUDY  05/15/2020   Procedure: BUBBLE STUDY;  Surgeon: Pixie Casino, MD;  Location: [facility] ENDOSCOPY;  Service: Cardiovascular;;  . cataract surgery  09/06   repair lens which moved  . EYE SURGERY    . LEFT HEART CATH AND CORONARY ANGIOGRAPHY N/A 01/21/2019   Procedure: LEFT HEART CATH AND CORONARY ANGIOGRAPHY;  Surgeon: Burnell Blanks, MD;  Location: [place] [facility] LAB;  Service: Cardiovascular;  Laterality: N/A;  . LOOP RECORDER INSERTION N/A 05/15/2020   Procedure: LOOP RECORDER INSERTION;  Surgeon: Thompson Grayer, MD;  Location: [place] CV LAB;  Service: Cardiovascular;  Laterality: N/A;  . stress myoview  06/29/06   sm distal anteroseptal & apical infarct  . TEE WITHOUT CARDIOVERSION N/A 05/15/2020   Procedure: TRANSESOPHAGEAL ECHOCARDIOGRAM (TEE);  Surgeon: Pixie Casino, MD;  Location: [facility] ENDOSCOPY;  Service: Cardiovascular;  Laterality: N/A;  . THYROIDECTOMY, PARTIAL  1967   B9 growth     Allergies:   Allergies  Allergen Reactions  . Ace Inhibitors Swelling    Swelling of the tongue  . Angiotensin Receptor Blockers Swelling    Tongue swelling  . Enalapril Swelling    Tongue swelling     Social History:  reports that he has quit smoking.  His smoking use included cigars. He has never used smokeless tobacco. He reports previous alcohol use. He reports that he does not use drugs.   Family History: Family History  Problem Relation Age of Onset  . Hypertension Mother   . Aneurysm Mother   . Stroke Mother   . Hypertension Father   . Heart disease Father        CAD  . Heart disease Son        MI  . Colon cancer Neg Hx   . Prostate cancer Neg Hx       Physical Exam: Vitals:   02/12/21 0400 02/12/21 0737 02/12/21 1100 02/12/21 1657  BP: (!) 154/92 (!) 142/101 (!) 152/105 111/79  Pulse: 65 67 73 76  Resp: 18 18 17  18   Temp: 97.9 F (36.6 C) 97.6 F (36.4 C) 98.1 F (36.7 C) 98.2 F (36.8 C)  TempSrc: Oral Oral Oral Oral  SpO2: 90% 91% 90% 91%  Weight:      Height:        Constitutional: Alert and awake, oriented x3, not in any acute distress. Eyes: EOMI, irises appear normal, anicteric sclera,  ENMT: external ears and nose appear normal, normal hearing, Lips appear normal, oropharynx mucosa, tongue appear normal  Neck: neck appears normal, no masses, normal ROM CVS: S1-S2 clear, no murmur rubs or gallops, no LE edema, normal pedal pulses  Respiratory:  clear to auscultation bilaterally, no wheezing, rales or rhonchi. Respiratory effort normal. No accessory muscle use.  Abdomen: soft nontender, nondistended, normal bowel sounds, no hepatosplenomegaly, no hernias  Musculoskeletal: : no cyanosis, clubbing or edema noted bilaterally Neuro: Cranial nerves II-XII intact, strength Psych: judgement and insight appear normal, stable mood and affect, mental status Skin: no rashes or lesions or ulcers, no induration or nodules    Data reviewed:  I have personally reviewed the recent labs and imaging studies  Pertinent Labs:   Glucose 233 HS troponin 19, 718 Normal CBC COVID POSITIVE   Inpatient Medications:   Scheduled Meds: . aspirin EC  81 mg Oral Daily  . buPROPion  150 mg Oral Daily  . carvedilol  3.125 mg Oral BID WC  . dorzolamide-timolol  1 drop Both Eyes BID  . gemfibrozil  600 mg Oral BID AC  . hydrALAZINE  10 mg Oral Q12H  . insulin aspart  0-5 Units Subcutaneous QHS  . insulin aspart  0-9 Units Subcutaneous TID WC  . isosorbide mononitrate  15 mg Oral Daily  . polyvinyl alcohol  1 drop Left Eye QHS  . rosuvastatin  40 mg Oral QHS  . sodium chloride flush  3 mL Intravenous Q12H  . traZODone  200 mg Oral QHS   Continuous Infusions: . heparin 1,400 Units/hr (02/12/21 1743)  . [START ON 02/13/2021] remdesivir 100 mg in NS 100 mL       Radiological Exams on Admission: DG  Chest Portable 1 View  Result Date: 02/11/2021 CLINICAL DATA:  Chest pain EXAM: PORTABLE CHEST 1 VIEW COMPARISON:  12/31/2020, 11/26/2020 FINDINGS: Emphysematous disease and bronchitic changes. No consolidation or effusion. Stable cardiomediastinal silhouette. Recording device over left chest. No pneumothorax. IMPRESSION: Emphysematous disease and bronchitic changes. Electronically Signed   By: Donavan Foil M.D.   On: 02/11/2021 21:38    Impression/Recommendations Principal Problem:   NSTEMI (non-ST elevated myocardial infarction) ([place]) Active Problems:   COVID-19 virus infection   NSTEMI -Patient with substernal chest pain that came on acutely at rest, radiating into jaw; resolved spontaneously -  Similar to prior index symptoms -CXR unremarkable.   -Initial HS troponin negative but marked elevation in repeats, c/w NSTEMI -EKG with apparent anterolateral ST depression of uncertain duration, no apparent STEMI. -Admitted by cardiology with plan for cath tomorrow  Incidental COVID -Patient with 2 immunizations, but positive today -No apparent symptoms -No O2 requirement -COVID POSITIVE -The patient has comorbidities which may increase the risk for ARDS/MODS including:  HTN, DM, CAD, CHF, COPD -Pertinent labs concerning for COVID include normal WBC; increased ferritin; low procalcitonin; markedly elevated D-dimer (>1) -Will order Remdesivir (pharmacy consult) given +COVID test for incidental COVID infection     Thank you for this consultation.  Our [facility] hospitalist team will follow the patient with you.   Time Spent: 47 minutes  Karmen Bongo M.D. [facility] 02/12/2021, 6:04 PM

## 2021-02-12 NOTE — Progress Notes (Signed)
  Echocardiogram 2D Echocardiogram has been performed.  Nathan Fernandez 02/12/2021, 3:17 PM

## 2021-02-12 NOTE — Progress Notes (Signed)
ANTICOAGULATION CONSULT NOTE - Initial Consult  Pharmacy Consult for heparin Indication: r/o ACS and h/o splenic infarct  Allergies  Allergen Reactions  . Ace Inhibitors Swelling    Swelling of the tongue  . Angiotensin Receptor Blockers Swelling    Tongue swelling  . Enalapril Swelling    Tongue swelling    Patient Measurements: Height: 5\' 9"  (175.3 cm) Weight: 74.8 kg (165 lb) IBW/kg (Calculated) : 70.7  Vital Signs: Temp: 97.9 F (36.6 C) (02/21 2300) Temp Source: Oral (02/21 2300) BP: 146/96 (02/22 0030) Pulse Rate: 69 (02/22 0030)  Labs: Recent Labs    02/11/21 2052 02/11/21 2307  HGB 15.3  --   HCT 45.7  --   PLT 203  --   CREATININE 0.88  --   TROPONINIHS 19* 718*    Estimated Creatinine Clearance: 67 mL/min (by C-G formula based on SCr of 0.88 mg/dL).   Medical History: Past Medical History:  Diagnosis Date  . 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   Hosp R facial weakness  . CAD (coronary artery disease)    MI, [place], PTCA; [place] 04/18/13: Distal left main 40%, proximal LAD with several aneurysmal segments, proximal LAD 50% prior to and after aneurysmal segments, area does not appear to flow-limiting, proximal diagonal 70%, ostial circumflex 20%, proximal circumflex 20%, OM1 30-40%, proximal RCA 40%, mid RCA 30%, distal RCA 20%, EF 50% => med Rx  . COPD (chronic obstructive pulmonary disease) ([place])   . DM2 (diabetes mellitus, type 2) ([place])   . History of ETT 1998   wnl  . History of MRI 08/04/09   brain- atrophy sm vess dz  . HLD (hyperlipidemia)   . HTN (hypertension)   . OA (osteoarthritis)     Assessment: 80yo male c/o sudden onset of central chest discomfort radiating to jaw, now resolved, to transition from Xarelto to UFH while awaiting possible procedures; last dose of Eliquis 2/21 ~1830.  Goal of Therapy:  Heparin level 0.3-0.7 units/ml aPTT 66-102 seconds Monitor platelets by anticoagulation protocol: Yes   Plan:  Will begin heparin gtt at 1400  units/hr at 1800 this evening and monitor heparin levels, aPTT (while Xarelto affecting anti-Xa), and CBC.  Wynona Neat, PharmD, BCPS  02/12/2021,2:29 AM

## 2021-02-12 NOTE — H&P (Signed)
Cardiology Admission History and Physical:   Patient ID: [name] MRN: [id number]; DOB: January 10, 1941   Admission date: 02/11/2021  PCP:  Tonia Ghent, MD   [place]  Cardiologist:  Jenkins Rouge, MD  Advanced Practice Provider:  No care team member to display Electrophysiologist:  None   Chief Complaint:  Chest pain  Patient Profile:   Nathan Fernandez is a 80 y.o. 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  History of Present Illness:   Nathan Fernandez was in his usual state of health until around 5pm 02/11/21 when he experienced sudden onset chest pain. He described central chest discomfort radiating to his jaw and down into his stomach. He had associated nausea but no vomiting, SOB, diaphoresis, dizziness, lightheadedness, syncope, or palpitations. He reported taking 2 SL nitro at home without relief of symptoms, though states his nitro pills were 27+ years old. Symptoms lasted for a couple hours before resolving spontaneously by the time he checked into the ER. He was found to have elevated HsTrop and transferred to [facility] for further evaluation.  He was last evaluated by cardiology at an outpatient visit with Dr. Johnsie Cancel 11/12/20, at which time he was doing fine from a cardiac standpoint. He had a TEE 04/2020 to evaluate renal/splenic infarct which showed EF 40-45%, global hypokinesis, no LA/LAA thrombus, and no significant valvular abnormalities. His last ischemic evaluation was a LHC 12/2018 which showed 30% dLM stenosis with moderate disease in p/m/dRCA, ostial LCx, OM2, p/mLAD, with 70% D1 stenosis which has been medically managed. He had a loop recorder placed following his embolic event which was last interrogated 02/04/21 and has not showed any atrial fibrillation or  tachy/brady/pause episodes.   Contacted the patient via telephone. He has remained chest pain free since arrival to the ED. He reports this is his first episode of chest pain in years. He states the chest pain felt similar to prior MI. In the past several weeks/months he has had no issues with SOB, DOE, orthopnea, PND, LE edema, palpitations, dizziness, lightheadedness, or syncope. He reports having a cough   ED course: hypertensive, otherwise VSS. Labs notable for electrolytes wnl, Cr 0.88, CBC wnl, HsTrop 19>718. EKG showed sinus rhythm with first degree AV block, rate 77bpm, RBBB, no STE/D, no significant change from previous. CXr showed emphysematous disease without acute findings. He was found to be COVID-19 positive. He was started on a heparin gtt and transferred to [facility] for admission to cardiology.  Past Medical History:  Diagnosis Date  . 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   Hosp R facial weakness  . CAD (coronary artery disease)    MI, [place], PTCA; [place] 04/18/13: Distal left main 40%, proximal LAD with several aneurysmal segments, proximal LAD 50% prior to and after aneurysmal segments, area does not appear to flow-limiting, proximal diagonal 70%, ostial circumflex 20%, proximal circumflex 20%, OM1 30-40%, proximal RCA 40%, mid RCA 30%, distal RCA 20%, EF 50% => med Rx  . COPD (chronic obstructive pulmonary disease) ([place])   . DM2 (diabetes mellitus, type 2) ([place])   . History of ETT 1998   wnl  . History of MRI 08/04/09   brain- atrophy sm vess dz  . HLD (hyperlipidemia)   . HTN (hypertension)   . OA (osteoarthritis)  Past Surgical History:  Procedure Laterality Date  . BUBBLE STUDY  05/15/2020   Procedure: BUBBLE STUDY;  Surgeon: Pixie Casino, MD;  Location: [facility] ENDOSCOPY;  Service: Cardiovascular;;  . cataract surgery  09/06   repair lens which moved  . EYE SURGERY    . LEFT HEART CATH AND CORONARY ANGIOGRAPHY N/A 01/21/2019   Procedure: LEFT HEART CATH AND CORONARY ANGIOGRAPHY;   Surgeon: Burnell Blanks, MD;  Location: [place] CV LAB;  Service: Cardiovascular;  Laterality: N/A;  . LOOP RECORDER INSERTION N/A 05/15/2020   Procedure: LOOP RECORDER INSERTION;  Surgeon: Thompson Grayer, MD;  Location: [place] CV LAB;  Service: Cardiovascular;  Laterality: N/A;  . stress myoview  06/29/06   sm distal anteroseptal & apical infarct  . TEE WITHOUT CARDIOVERSION N/A 05/15/2020   Procedure: TRANSESOPHAGEAL ECHOCARDIOGRAM (TEE);  Surgeon: Pixie Casino, MD;  Location: [facility] ENDOSCOPY;  Service: Cardiovascular;  Laterality: N/A;  . THYROIDECTOMY, PARTIAL  1967   B9 growth     Medications Prior to Admission: Prior to Admission medications   Medication Sig Start Date End Date Taking? Authorizing Provider  albuterol (VENTOLIN HFA) 108 (90 Base) MCG/ACT inhaler Inhale 1-2 puffs into the lungs every 6 (six) hours as needed for shortness of breath. 09/06/19   Tonia Ghent, MD  amoxicillin-clavulanate (AUGMENTIN) 875-125 MG tablet Take 1 tablet by mouth 2 (two) times daily. 01/18/21   Tonia Ghent, MD  aspirin 81 MG EC tablet Take 81 mg by mouth daily.    [provider]  benzonatate (TESSALON) 200 MG capsule Take 1 capsule (200 mg total) by mouth 3 (three) times daily as needed. 01/18/21   Tonia Ghent, MD  buPROPion [facility] SR) 150 MG 12 hr tablet Take 1 tablet (150 mg total) by mouth daily. 09/07/20   Tonia Ghent, MD  carvedilol (COREG) 6.25 MG tablet Take 0.5 tablets (3.125 mg total) by mouth in the morning and at bedtime. 09/07/20   Tonia Ghent, MD  cyclobenzaprine (FLEXERIL) 5 MG tablet Take 1 tablet (5 mg total) by mouth 3 (three) times daily as needed for muscle spasms. 07/07/19   Tonia Ghent, MD  dorzolamide-timolol (COSOPT) 22.3-6.8 MG/ML ophthalmic solution Place 1 drop into both eyes 2 (two) times daily. 02/13/20   [provider]  gemfibrozil (LOPID) 600 MG tablet  01/23/21   [provider]  hydrALAZINE  (APRESOLINE) 10 MG tablet Take 1 tablet (10 mg total) by mouth every 12 (twelve) hours. 05/02/20 05/02/21  Richardson Dopp T, PA-C  isosorbide mononitrate (IMDUR) 30 MG 24 hr tablet TAKE 1/2 TABLET BY MOUTH EVERY DAY 05/07/20   Josue Hector, MD  ketoconazole (NIZORAL) 2 % cream Apply 1 application topically 2 (two) times daily as needed for irritation. 03/11/18   Tonia Ghent, MD  meclizine (ANTIVERT) 12.5 MG tablet Take 1-2 tablets (12.5-25 mg total) by mouth 3 (three) times daily as needed for dizziness. 02/16/15   Tonia Ghent, MD  nitroGLYCERIN (NITROSTAT) 0.4 MG SL tablet Place 1 tablet (0.4 mg total) under the tongue every 5 (five) minutes as needed for chest pain. Up to 3 doses 02/11/19   Josue Hector, MD  Polyethyl Glycol-Propyl Glycol (SYSTANE) 0.4-0.3 % GEL ophthalmic gel Place 1 application into the left eye at bedtime.    [provider]  rosuvastatin (CRESTOR) 20 MG tablet Take 1 tablet (20 mg total) by mouth daily. 01/16/21   Tonia Ghent, MD  traZODone (DESYREL) 100  MG tablet TAKE 2 TABLETS (200 MG TOTAL) BY MOUTH AT BEDTIME. 01/02/21   Tonia Ghent, MD  XARELTO 20 MG TABS tablet TAKE 1 TABLET BY MOUTH DAILY WITH SUPPER. 01/03/21   Brunetta Genera, MD     Allergies:    Allergies  Allergen Reactions  . Ace Inhibitors Swelling    Swelling of the tongue  . Angiotensin Receptor Blockers Swelling    Tongue swelling  . Enalapril Swelling    Tongue swelling    Social History:   Social History   Socioeconomic History  . Marital status: Married    Spouse name: Not on file  . Number of children: 4  . Years of education: Not on file  . Highest education level: Not on file  Occupational History  . 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 Occupation: retired  Tobacco Use  . Smoking status: Former Smoker    Types: Cigars  . Smokeless tobacco: Never Used  . Tobacco comment: cigar  occassionally  Vaping Use  . Vaping Use: Never used  Substance and Sexual Activity  . Alcohol use: Not Currently    Alcohol/week: 0.0 standard drinks  . Drug use: No  . Sexual activity: Not on file  Other Topics Concern  . Not on file  Social History Narrative   Divorced, lives with partner [name])- married 09/2015   3 living children, had a daughter who died at age 17 in 11-Nov-2023   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, retired as of 2019   Social Determinants of Health   Financial Resource Strain: High Risk  . Difficulty of Paying Living Expenses: Hard  Food Insecurity: No Food Insecurity  . Worried About Charity fundraiser in the Last Year: Never true  . Ran Out of Food in the Last Year: Never true  Transportation Needs: No Transportation Needs  . Lack of Transportation (Medical): No  . Lack of Transportation (Non-Medical): No  Physical Activity: Inactive  . Days of Exercise per Week: 0 days  . Minutes of Exercise per Session: 0 min  Stress: No Stress Concern Present  . Feeling of Stress : Not at all  Social Connections: Not on file  Intimate Partner Violence: Not At Risk  . Fear of Current or Ex-Partner: No  . Emotionally Abused: No  . Physically Abused: No  . Sexually Abused: No    Family History:   The patient's family history includes Aneurysm in his mother; Heart disease in his father and son; Hypertension in his father and mother; Stroke in his mother. There is no history of Colon cancer or Prostate cancer.    ROS:  Please see the history of present illness.  All other ROS reviewed and negative.     Physical Exam/Data:   Vitals:   02/12/21 0030 02/12/21 0300 02/12/21 0400 02/12/21 0737  BP: (!) 146/96 (!) 150/92 (!) 154/92 (!) 142/101  Pulse: 69 68 65 67  Resp: 14 18 18 18   Temp:   97.9 F (36.6 C) 97.6 F (36.4 C)  TempSrc:   Oral Oral  SpO2: 92% 90% 90% 91%  Weight:      Height:        Intake/Output Summary (Last 24 hours) at 02/12/2021 0951 Last data  filed at 02/12/2021 0948 Gross per 24 hour  Intake 240 ml  Output --  Net 240 ml   Last 3 Weights 02/11/2021 01/18/2021 11/12/2020  Weight (lbs)  165 lb 168 lb 170 lb  Weight (kg) 74.844 kg 76.204 kg 77.111 kg     Body mass index is 24.37 kg/m.  General:  In NAD over the phone Cardiac:  No LE edema Lungs:  Speaking in full sentences without SOB Psych:  Normal affect    EKG: sinus rhythm with first degree AV block, rate 77bpm, RBBB, no STE/D, no significant change from previous.   Relevant CV Studies: TEE 04/2020: 1. Left ventricular ejection fraction, by estimation, is 40 to 45%. The  left ventricle has mildly decreased function. The left ventricle  demonstrates global hypokinesis.  2. Right ventricular systolic function is normal. The right ventricular  size is normal.  3. Bilobed atrial appendage.. No left atrial/left atrial appendage  thrombus was detected.  4. The mitral valve is normal in structure. Trivial mitral valve  regurgitation.  5. The aortic valve is tricuspid. Aortic valve regurgitation is not  visualized.  6. There is mild (Grade II) layered plaque involving the transverse and  descending aorta.  7. Agitated saline contrast bubble study was negative, with no evidence  of any interatrial shunt.   LHC 12/2018:  Prox RCA to Mid RCA lesion is 50% stenosed.  Dist RCA lesion is 30% stenosed.  2nd Mrg lesion is 40% stenosed.  Ost Cx to Prox Cx lesion is 30% stenosed.  Ost LAD to Prox LAD lesion is 50% stenosed.  Prox LAD lesion is 50% stenosed.  Ost 1st Diag lesion is 70% stenosed.  Mid LAD lesion is 30% stenosed.  Ost LM to Mid LM lesion is 30% stenosed.  There is mild to moderate left ventricular systolic dysfunction.  The left ventricular ejection fraction is 35-45% by visual estimate.  There is no mitral valve regurgitation.  LV end diastolic pressure is normal.  Prox RCA lesion is 30% stenosed.   1. The left main has a mild distal  stenosis 2. The LAD courses to the apex. 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 There is a 50% stenosis in the proximal LAD followed by an aneurysmal segment. There is another 50% stenosis followed by an aneurysmal segment. There is a small diagonal that arises from the aneurysmal segment with severe ostial stenosis (too small for PCI). The LAD overall appears unchanged from his last cath in 2014. The proximal lesions do not appear to be flow limiting.  3. The Circumflex is a large caliber vessel with mild ostial stenosis, non-obstructive disease in the obtuse marginal branch.  4. The RCA is a large dominant vessel with mild proximal disease followed by an aneurysmal segment. There appears to be a healed plaque in the mid RCA just beyond the first aneurysmal segment. The mid vessel has a 50% stenosis followed by another aneurysmal segment. This is grossly unchanged from his cath in 2014. The lesions do not appear to be flow limiting.  5. Moderate segmental LV systolic dysfunction  Recommendations: Complex CAD as described above with no clear change in anatomy since cath in 2014. I do not see any focal targets for PCI. I would recommend medical management of his CAD.   Diagnostic Dominance: Right       Laboratory Data:  High Sensitivity Troponin:   Recent Labs  Lab 02/11/21 2052 02/11/21 2307  TROPONINIHS 19* 718*      Chemistry Recent Labs  Lab 02/11/21 2052  NA 135  K 4.3  CL 99  CO2 27  GLUCOSE 233*  BUN 12  CREATININE 0.88  CALCIUM 8.5*  GFRNONAA >60  ANIONGAP 9    Recent Labs  Lab 02/11/21 2052  PROT 6.7  ALBUMIN 3.5  AST 26  ALT 21  ALKPHOS 53  BILITOT 0.3   Hematology Recent Labs  Lab 02/11/21 2052  WBC 8.9  RBC 5.00  HGB 15.3  HCT 45.7  MCV 91.4  MCH 30.6  MCHC 33.5  RDW 13.0  PLT 203   BNPNo results for input(s): BNP, PROBNP in the last 168 hours.  DDimer No results for input(s): DDIMER in the last 168  hours.   Radiology/Studies:  DG Chest Portable 1 View  Result Date: 02/11/2021 CLINICAL DATA:  Chest pain EXAM: PORTABLE CHEST 1 VIEW COMPARISON:  12/31/2020, 11/26/2020 FINDINGS: Emphysematous disease and bronchitic changes. No consolidation or effusion. Stable cardiomediastinal silhouette. Recording device over left chest. No pneumothorax. IMPRESSION: Emphysematous disease and bronchitic changes. Electronically Signed   By: Donavan Foil M.D.   On: 02/11/2021 21:38     Assessment and Plan:   1. 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 He reports this felt similar to prior MI, though patient has no history of PCI in the past. He has known diffuse mild-moderate CAD with 70% D1 stenosis which has been medically managed. HsTrop 19>718. EKG is non-ischemic. He was started on a heparin gtt and transferred to [facility] for admission. Incidentally COVID-19 positive. Symptoms c/f unstable angina. He took his last dose of xarelto around 6pm 02/11/21.  - Continue heparin gtt - Will plan for [facility] 02/13/21.  - Continue aspirin and statin - Continue imdur and carvedilol  2. Chronic combined CHF: no complaints of volume overload. EF 40-45% on TEE 04/2020. Noted to have allergy to ACEi so ARB/ARNI have been avoided. CXR without acute findings - Continue carvedilol, hydralazine, and imdur - Monitor volume status closely with strict I&Os and daily weights  3. HTN: BP elevated this admission. Suspect due to missing medications overnight - Will resume home carvedilol, hydralazine, and imdur - Adjust dose as needed  4. HLD: no recent lipids on file, though LDL 81 and Triglycerides 320 08/2019 - Will increase crestro to 40mg  daily - Continue gemfibrazole - Check FLP in AM  5. 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 Does not appear to be on medications at home.  - Will check A1C - Would consider adding SGLT2-inhibitor this  admission  6. COVID-19 infection: found to be positive for COVID-19 this admission. He was treated with antibiotics, albuterol inhaler, and tessalon pearls for a cough in January 2022, though was not tested for COVID-19 at that time. Reports cough has since resolved. No complaints of fevers, rhinorrhea, loss of taste/smell, or diarrhea recently.  - Will consult IM for input on management, though suspect this is not an active infection given recent symptoms c/f COVID infection 12/2020.   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 He has been on xarelto since that time with anticipated lifelong treatment. He had ILR placed to monitor for underlying atrial fibrillation, though has not had any afib occurrence on routine monitoring (last check 02/04/21) - Continue heparin gtt in anticipation of LHC with plans to restart xarelto afterwards   Risk Assessment/Risk Scores:     TIMI Risk Score for Unstable Angina or Non-ST Elevation MI:   The patient's TIMI risk score is 5, which indicates a 26% risk of all cause mortality, new or recurrent myocardial infarction or need for urgent  revascularization in the next 14 days.  New York Heart Association (NYHA) Functional Class NYHA Class II     Severity of Illness: The appropriate patient status for this patient is OBSERVATION. Observation status is judged to be reasonable and necessary in order to provide the required intensity of service to ensure the patient's safety. The patient's presenting symptoms, physical exam findings, and initial radiographic and laboratory data in the context of their medical condition is felt to place them at decreased risk for further clinical deterioration. Furthermore, it is anticipated that the patient will be medically stable for discharge from the hospital within 2 midnights of admission. The following factors support the patient status of observation.   " The patient's presenting symptoms  include chest pain. " The physical exam findings include no conversational SOB. " The initial radiographic and laboratory data are elevated HsTroponin.     For questions or updates, please contact [place] Please consult [email or web address] for contact info under     Signed, Abigail Butts, PA-C  02/12/2021 9:51 AM

## 2021-02-13 ENCOUNTER — Encounter (HOSPITAL_COMMUNITY)
Admission: EM | Disposition: A | Discharge status: Home / Self Care | Payer: Self-pay | Source: Home / Self Care | Attending: Cardiology

## 2021-02-13 DIAGNOSIS — I110 Hypertensive heart disease with heart failure: Secondary | ICD-10-CM | POA: Diagnosis not present

## 2021-02-13 DIAGNOSIS — Z79899 Other long term (current) drug therapy: Secondary | ICD-10-CM | POA: Diagnosis not present

## 2021-02-13 DIAGNOSIS — U071 COVID-19: Secondary | ICD-10-CM | POA: Diagnosis present

## 2021-02-13 DIAGNOSIS — I214 Non-ST elevation (NSTEMI) myocardial infarction: Principal | ICD-10-CM

## 2021-02-13 DIAGNOSIS — J449 Chronic obstructive pulmonary disease, unspecified: Secondary | ICD-10-CM | POA: Diagnosis present

## 2021-02-13 DIAGNOSIS — I2510 Atherosclerotic heart disease of native coronary artery without angina pectoris: Secondary | ICD-10-CM | POA: Diagnosis not present

## 2021-02-13 DIAGNOSIS — Z823 Family history of stroke: Secondary | ICD-10-CM | POA: Diagnosis not present

## 2021-02-13 DIAGNOSIS — I252 Old myocardial infarction: Secondary | ICD-10-CM | POA: Diagnosis not present

## 2021-02-13 DIAGNOSIS — E119 Type 2 diabetes mellitus without complications: Secondary | ICD-10-CM | POA: Diagnosis not present

## 2021-02-13 DIAGNOSIS — Z7901 Long term (current) use of anticoagulants: Secondary | ICD-10-CM | POA: Diagnosis not present

## 2021-02-13 DIAGNOSIS — I25110 Atherosclerotic heart disease of native coronary artery with unstable angina pectoris: Secondary | ICD-10-CM | POA: Diagnosis not present

## 2021-02-13 DIAGNOSIS — I5042 Chronic combined systolic (congestive) and diastolic (congestive) heart failure: Secondary | ICD-10-CM | POA: Diagnosis not present

## 2021-02-13 DIAGNOSIS — Z8673 Personal history of transient ischemic attack (TIA), and cerebral infarction without residual deficits: Secondary | ICD-10-CM | POA: Diagnosis not present

## 2021-02-13 DIAGNOSIS — E785 Hyperlipidemia, unspecified: Secondary | ICD-10-CM | POA: Diagnosis not present

## 2021-02-13 DIAGNOSIS — Z87891 Personal history of nicotine dependence: Secondary | ICD-10-CM | POA: Diagnosis not present

## 2021-02-13 DIAGNOSIS — Z7982 Long term (current) use of aspirin: Secondary | ICD-10-CM | POA: Diagnosis not present

## 2021-02-13 DIAGNOSIS — Z8249 Family history of ischemic heart disease and other diseases of the circulatory system: Secondary | ICD-10-CM | POA: Diagnosis not present

## 2021-02-13 DIAGNOSIS — Z888 Allergy status to other drugs, medicaments and biological substances status: Secondary | ICD-10-CM | POA: Diagnosis not present

## 2021-02-13 HISTORY — PX: LEFT HEART CATH AND CORONARY ANGIOGRAPHY: CATH118249

## 2021-02-13 LAB — CBC WITH DIFFERENTIAL/PLATELET
Abs Immature Granulocytes: 0.02 10*3/uL (ref 0.00–0.07)
Basophils Absolute: 0 10*3/uL (ref 0.0–0.1)
Basophils Relative: 1 %
Eosinophils Absolute: 0.3 10*3/uL (ref 0.0–0.5)
Eosinophils Relative: 4 %
HCT: 40.8 % (ref 39.0–52.0)
Hemoglobin: 13.8 g/dL (ref 13.0–17.0)
Immature Granulocytes: 0 %
Lymphocytes Relative: 38 %
Lymphs Abs: 2.8 10*3/uL (ref 0.7–4.0)
MCH: 30.5 pg (ref 26.0–34.0)
MCHC: 33.8 g/dL (ref 30.0–36.0)
MCV: 90.3 fL (ref 80.0–100.0)
Monocytes Absolute: 0.7 10*3/uL (ref 0.1–1.0)
Monocytes Relative: 10 %
Neutro Abs: 3.4 10*3/uL (ref 1.7–7.7)
Neutrophils Relative %: 47 %
Platelets: 178 10*3/uL (ref 150–400)
RBC: 4.52 MIL/uL (ref 4.22–5.81)
RDW: 13 % (ref 11.5–15.5)
WBC: 7.3 10*3/uL (ref 4.0–10.5)
nRBC: 0 % (ref 0.0–0.2)

## 2021-02-13 LAB — HEPARIN LEVEL (UNFRACTIONATED): Heparin Unfractionated: 0.42 IU/mL (ref 0.30–0.70)

## 2021-02-13 LAB — COMPREHENSIVE METABOLIC PANEL
ALT: 18 U/L (ref 0–44)
AST: 25 U/L (ref 15–41)
Albumin: 2.8 g/dL — ABNORMAL LOW (ref 3.5–5.0)
Alkaline Phosphatase: 41 U/L (ref 38–126)
Anion gap: 8 (ref 5–15)
BUN: 15 mg/dL (ref 8–23)
CO2: 27 mmol/L (ref 22–32)
Calcium: 8.8 mg/dL — ABNORMAL LOW (ref 8.9–10.3)
Chloride: 102 mmol/L (ref 98–111)
Creatinine, Ser: 0.86 mg/dL (ref 0.61–1.24)
GFR, Estimated: >60 mL/min (ref >60)
Glucose, Bld: 132 mg/dL — ABNORMAL HIGH (ref 70–99)
Potassium: 4.2 mmol/L (ref 3.5–5.1)
Sodium: 137 mmol/L (ref 135–145)
Total Bilirubin: 0.3 mg/dL (ref 0.3–1.2)
Total Protein: 5.5 g/dL — ABNORMAL LOW (ref 6.5–8.1)

## 2021-02-13 LAB — D-DIMER, QUANTITATIVE: D-Dimer, Quant: 0.98 ug/mL-FEU — ABNORMAL HIGH (ref 0.00–0.50)

## 2021-02-13 LAB — FERRITIN: Ferritin: 786 ng/mL — ABNORMAL HIGH (ref 24–336)

## 2021-02-13 LAB — LIPID PANEL
Cholesterol: 87 mg/dL (ref 0–200)
HDL: 33 mg/dL — ABNORMAL LOW (ref >40)
LDL Cholesterol: 29 mg/dL (ref 0–99)
Total CHOL/HDL Ratio: 2.6 RATIO
Triglycerides: 126 mg/dL (ref <150)
VLDL: 25 mg/dL (ref 0–40)

## 2021-02-13 LAB — C-REACTIVE PROTEIN: CRP: 0.8 mg/dL (ref <1.0)

## 2021-02-13 LAB — GLUCOSE, CAPILLARY
Glucose-Capillary: 124 mg/dL — ABNORMAL HIGH (ref 70–99)
Glucose-Capillary: 129 mg/dL — ABNORMAL HIGH (ref 70–99)
Glucose-Capillary: 123 mg/dL — ABNORMAL HIGH (ref 70–99)
Glucose-Capillary: 184 mg/dL — ABNORMAL HIGH (ref 70–99)

## 2021-02-13 LAB — SURGICAL PCR SCREEN
MRSA, PCR: NEGATIVE
Staphylococcus aureus: NEGATIVE

## 2021-02-13 LAB — PHOSPHORUS: Phosphorus: 3.4 mg/dL (ref 2.5–4.6)

## 2021-02-13 LAB — APTT: aPTT: 39 seconds — ABNORMAL HIGH (ref 24–36)

## 2021-02-13 LAB — MAGNESIUM: Magnesium: 1.7 mg/dL (ref 1.7–2.4)

## 2021-02-13 SURGERY — LEFT HEART CATH AND CORONARY ANGIOGRAPHY
Anesthesia: LOCAL

## 2021-02-13 MED ORDER — ROSUVASTATIN CALCIUM 20 MG PO TABS
20.0000 mg | ORAL_TABLET | Freq: Every day | ORAL | Status: DC
  Administered 2021-02-13: 20 mg via ORAL
  Filled 2021-02-13: qty 1

## 2021-02-13 MED ORDER — SODIUM CHLORIDE 0.9% FLUSH: 3.0000 mL | INTRAVENOUS | Status: DC | PRN

## 2021-02-13 MED ORDER — SODIUM CHLORIDE 0.9 % IV SOLN: 250.0000 mL | INTRAVENOUS | Status: DC | PRN

## 2021-02-13 MED ORDER — SODIUM CHLORIDE 0.9 % IV SOLN: INTRAVENOUS | Status: AC

## 2021-02-13 MED ORDER — FENTANYL CITRATE (PF) 100 MCG/2ML IJ SOLN
INTRAMUSCULAR | Status: AC
  Filled 2021-02-13: qty 2

## 2021-02-13 MED ORDER — VERAPAMIL HCL 2.5 MG/ML IV SOLN
INTRAVENOUS | Status: DC | PRN
  Administered 2021-02-13: 10 mL via INTRA_ARTERIAL

## 2021-02-13 MED ORDER — SODIUM CHLORIDE 0.9% FLUSH
3.0000 mL | Freq: Two times a day (BID) | INTRAVENOUS | Status: DC
  Administered 2021-02-14: 3 mL via INTRAVENOUS

## 2021-02-13 MED ORDER — HEPARIN (PORCINE) IN NACL 1000-0.9 UT/500ML-% IV SOLN
INTRAVENOUS | Status: AC
  Filled 2021-02-13: qty 1000

## 2021-02-13 MED ORDER — IOHEXOL 350 MG/ML SOLN
INTRAVENOUS | Status: DC | PRN
  Administered 2021-02-13: 65 mL

## 2021-02-13 MED ORDER — MIDAZOLAM HCL 2 MG/2ML IJ SOLN
INTRAMUSCULAR | Status: AC
  Filled 2021-02-13: qty 2

## 2021-02-13 MED ORDER — FENTANYL CITRATE (PF) 100 MCG/2ML IJ SOLN
INTRAMUSCULAR | Status: DC | PRN
  Administered 2021-02-13: 25 ug via INTRAVENOUS

## 2021-02-13 MED ORDER — VERAPAMIL HCL 2.5 MG/ML IV SOLN
INTRAVENOUS | Status: AC
  Filled 2021-02-13: qty 2

## 2021-02-13 MED ORDER — HEPARIN SODIUM (PORCINE) 1000 UNIT/ML IJ SOLN
INTRAMUSCULAR | Status: AC
  Filled 2021-02-13: qty 1

## 2021-02-13 MED ORDER — HEPARIN SODIUM (PORCINE) 1000 UNIT/ML IJ SOLN
INTRAMUSCULAR | Status: DC | PRN
  Administered 2021-02-13: 4000 [IU] via INTRAVENOUS

## 2021-02-13 MED ORDER — HEPARIN (PORCINE) 25000 UT/250ML-% IV SOLN
1700.0000 [IU]/h | INTRAVENOUS | Status: DC
  Administered 2021-02-13: 23:00:00 1700 [IU]/h via INTRAVENOUS
  Filled 2021-02-13: qty 250

## 2021-02-13 MED ORDER — MIDAZOLAM HCL 2 MG/2ML IJ SOLN
INTRAMUSCULAR | Status: DC | PRN
  Administered 2021-02-13: 1 mg via INTRAVENOUS

## 2021-02-13 MED ORDER — LIDOCAINE HCL (PF) 1 % IJ SOLN
INTRAMUSCULAR | Status: DC | PRN
  Administered 2021-02-13: 2 mL

## 2021-02-13 MED ORDER — LIDOCAINE HCL (PF) 1 % IJ SOLN
INTRAMUSCULAR | Status: AC
  Filled 2021-02-13: qty 30

## 2021-02-13 MED ORDER — HEPARIN (PORCINE) IN NACL 1000-0.9 UT/500ML-% IV SOLN
INTRAVENOUS | Status: DC | PRN
  Administered 2021-02-13 (×2): 500 mL

## 2021-02-13 SURGICAL SUPPLY — 12 items
BAG SNAP BAND KOVER 36X36 (MISCELLANEOUS) ×1 IMPLANT
CATH INFINITI 5FR ANG PIGTAIL (CATHETERS) ×1 IMPLANT
CATH INFINITI 5FR JK (CATHETERS) ×1 IMPLANT
COVER DOME SNAP 22 D (MISCELLANEOUS) ×1 IMPLANT
DEVICE RAD COMP TR BAND LRG (VASCULAR PRODUCTS) ×1 IMPLANT
GLIDESHEATH SLEND SS 6F .021 (SHEATH) ×1 IMPLANT
GUIDEWIRE INQWIRE 1.5J.035X260 (WIRE) IMPLANT
INQWIRE 1.5J .035X260CM (WIRE) ×2
KIT HEART LEFT (KITS) ×2 IMPLANT
PACK CARDIAC CATHETERIZATION (CUSTOM PROCEDURE TRAY) ×2 IMPLANT
TRANSDUCER W/STOPCOCK (MISCELLANEOUS) ×2 IMPLANT
TUBING CIL FLEX 10 FLL-RA (TUBING) ×2 IMPLANT

## 2021-02-13 NOTE — Progress Notes (Signed)
Progress Note  Patient Name: Nathan Fernandez Date of Encounter: 02/13/2021  [facility] Cardiologist: Charlton Haws, MD   Subjective   No further chest pain. No dyspnea or cough.  Inpatient Medications    Scheduled Meds: . aspirin EC  81 mg Oral Daily  . buPROPion  150 mg Oral Daily  . carvedilol  3.125 mg Oral BID WC  . dorzolamide-timolol  1 drop Both Eyes BID  . gemfibrozil  600 mg Oral BID AC  . hydrALAZINE  10 mg Oral Q12H  . insulin aspart  0-5 Units Subcutaneous QHS  . insulin aspart  0-9 Units Subcutaneous TID WC  . isosorbide mononitrate  15 mg Oral Daily  . polyvinyl alcohol  1 drop Left Eye QHS  . rosuvastatin  40 mg Oral QHS  . sodium chloride flush  3 mL Intravenous Q12H  . traZODone  200 mg Oral QHS   Continuous Infusions: . sodium chloride    . sodium chloride    . heparin 1,700 Units/hr (02/13/21 0318)  . remdesivir 100 mg in NS 100 mL     PRN Meds: sodium chloride, acetaminophen, albuterol, benzonatate, nitroGLYCERIN, ondansetron (ZOFRAN) IV, sodium chloride flush   Vital Signs    Vitals:   02/12/21 1953 02/12/21 2319 02/13/21 0330 02/13/21 0750  BP: 117/75 117/78 103/72 (!) 148/93  Pulse: 76 70 66 74  Resp: 18 19 17 18   Temp: 98.3 F (36.8 C) 98.3 F (36.8 C) 98.3 F (36.8 C) 97.6 F (36.4 C)  TempSrc: Axillary Axillary Axillary Oral  SpO2: 92% 90% 91% 90%  Weight:      Height:        Intake/Output Summary (Last 24 hours) at 02/13/2021 1002 Last data filed at 02/12/2021 1906 Gross per 24 hour  Intake 787.7 ml  Output --  Net 787.7 ml   Last 3 Weights 02/11/2021 01/18/2021 11/12/2020  Weight (lbs) 165 lb 168 lb 170 lb  Weight (kg) 74.844 kg 76.204 kg 77.111 kg      Telemetry    NSR - Personally Reviewed  ECG    NSR with RBBB- Personally Reviewed  Physical Exam   GEN: No acute distress.   Neck: No JVD Cardiac: RRR, no murmurs, rubs, or gallops.  Respiratory: Clear to auscultation bilaterally. GI: Soft, nontender,  non-distended  MS: No edema; No deformity. Neuro:  Nonfocal  Psych: Normal affect   Labs    High Sensitivity Troponin:   Recent Labs  Lab 02/11/21 2052 02/11/21 2307 02/12/21 0622 02/12/21 1058  TROPONINIHS 19* 718* 3,329* 3,350*      Chemistry Recent Labs  Lab 02/11/21 2052 02/12/21 0622 02/13/21 0149  NA 135 138 137  K 4.3 4.1 4.2  CL 99 104 102  CO2 27 23 27   GLUCOSE 233* 166* 132*  BUN 12 9 15   CREATININE 0.88 0.75 0.86  CALCIUM 8.5* 8.7* 8.8*  PROT 6.7  --  5.5*  ALBUMIN 3.5  --  2.8*  AST 26  --  25  ALT 21  --  18  ALKPHOS 53  --  41  BILITOT 0.3  --  0.3  GFRNONAA >60 >60 >60  ANIONGAP 9 11 8      Hematology Recent Labs  Lab 02/11/21 2052 02/13/21 0149  WBC 8.9 7.3  RBC 5.00 4.52  HGB 15.3 13.8  HCT 45.7 40.8  MCV 91.4 90.3  MCH 30.6 30.5  MCHC 33.5 33.8  RDW 13.0 13.0  PLT 203 178    BNPNo  results for input(s): BNP, PROBNP in the last 168 hours.   DDimer  Recent Labs  Lab 02/12/21 1236 02/13/21 0149  DDIMER 1.08* 0.98*     Radiology    DG Chest Portable 1 View  Result Date: 02/11/2021 CLINICAL DATA:  Chest pain EXAM: PORTABLE CHEST 1 VIEW COMPARISON:  12/31/2020, 11/26/2020 FINDINGS: Emphysematous disease and bronchitic changes. No consolidation or effusion. Stable cardiomediastinal silhouette. Recording device over left chest. No pneumothorax. IMPRESSION: Emphysematous disease and bronchitic changes. Electronically Signed   By: Jasmine Pang M.D.   On: 02/11/2021 21:38   ECHOCARDIOGRAM COMPLETE  Result Date: 02/12/2021    ECHOCARDIOGRAM REPORT   Patient Name:   Nathan Fernandez Date of Exam: 02/12/2021 Medical Rec #:  [id number]     Height:       69.0 in Accession #:    [id number]    Weight:       165.0 lb Date of Birth:  05-13-1941     BSA:          1.904 m Patient Age:    79 years      BP:           152/105 mmHg Patient Gender: M             HR:           81 bpm. Exam Location:  Inpatient Procedure: 2D Echo, 3D Echo, Cardiac Doppler and  Color Doppler Indications:    R07.9* Chest pain, unspecified  History:        Patient has prior history of Echocardiogram examinations, most                 recent 05/15/2020. Acute MI and CAD, Abnormal ECG, COPD,                 Arrythmias:Atrial Fibrillation and RBBB, Signs/Symptoms:Chest                 Pain; Risk Factors:Current Smoker, Dyslipidemia and                 Hypertension. Covid positive. Hypoxia.  Sonographer:    Sheralyn Boatman RDCS Referring Phys: [id number] Beatriz Stallion  Sonographer Comments: Technically difficult study due to poor echo windows. IMPRESSIONS  1. Left ventricular ejection fraction, by estimation, is 45 to 50%. The left ventricle has mildly decreased function. The left ventricle demonstrates global hypokinesis. There is mild concentric left ventricular hypertrophy. Left ventricular diastolic parameters are indeterminate.  2. Right ventricular systolic function is low normal. The right ventricular size is mildly enlarged.  3. The mitral valve is normal in structure. Trivial mitral valve regurgitation. No evidence of mitral stenosis.  4. The aortic valve is tricuspid. Aortic valve regurgitation is not visualized. No aortic stenosis is present.  5. The inferior vena cava is normal in size with greater than 50% respiratory variability, suggesting right atrial pressure of 3 mmHg. Comparison(s): Changes from prior study are noted. EF improved slightly from prior. FINDINGS  Left Ventricle: Left ventricular ejection fraction, by estimation, is 45 to 50%. The left ventricle has mildly decreased function. The left ventricle demonstrates global hypokinesis. The left ventricular internal cavity size was normal in size. There is  mild concentric left ventricular hypertrophy. Left ventricular diastolic parameters are indeterminate. Right Ventricle: The right ventricular size is mildly enlarged. Right vetricular wall thickness was not well visualized. Right ventricular systolic function is low normal.  Left Atrium: Left atrial size was normal in size. Right Atrium: Right  atrial size was normal in size. Pericardium: There is no evidence of pericardial effusion. Presence of pericardial fat pad. Mitral Valve: The mitral valve is normal in structure. Trivial mitral valve regurgitation. No evidence of mitral valve stenosis. Tricuspid Valve: The tricuspid valve is normal in structure. Tricuspid valve regurgitation is trivial. No evidence of tricuspid stenosis. Aortic Valve: The aortic valve is tricuspid. Aortic valve regurgitation is not visualized. No aortic stenosis is present. Pulmonic Valve: The pulmonic valve was not well visualized. Pulmonic valve regurgitation is not visualized. Aorta: The aortic root, ascending aorta and aortic arch are all structurally normal, with no evidence of dilitation or obstruction. Venous: The inferior vena cava is normal in size with greater than 50% respiratory variability, suggesting right atrial pressure of 3 mmHg. IAS/Shunts: The atrial septum is grossly normal.  LEFT VENTRICLE PLAX 2D LVIDd:         4.60 cm     Diastology LVIDs:         3.50 cm     LV e' medial:    6.00 cm/s LV PW:         1.40 cm     LV E/e' medial:  7.3 LV IVS:        1.30 cm     LV e' lateral:   6.08 cm/s LVOT diam:     2.10 cm     LV E/e' lateral: 7.2 LV SV:         41 LV SV Index:   21 LVOT Area:     3.46 cm  LV Volumes (MOD) LV vol d, MOD A2C: 50.8 ml LV vol d, MOD A4C: 58.3 ml LV vol s, MOD A2C: 32.5 ml LV vol s, MOD A4C: 25.8 ml LV SV MOD A2C:     18.2 ml LV SV MOD A4C:     58.3 ml LV SV MOD BP:      24.6 ml RIGHT VENTRICLE             IVC RV S prime:     15.60 cm/s  IVC diam: 1.90 cm TAPSE (M-mode): 1.7 cm LEFT ATRIUM             Index       RIGHT ATRIUM           Index LA diam:        3.70 cm 1.94 cm/m  RA Area:     11.20 cm LA Vol (A2C):   36.0 ml 18.91 ml/m RA Volume:   22.30 ml  11.71 ml/m LA Vol (A4C):   38.9 ml 20.43 ml/m LA Biplane Vol: 40.6 ml 21.33 ml/m  AORTIC VALVE LVOT Vmax:   71.10  cm/s LVOT Vmean:  47.700 cm/s LVOT VTI:    0.117 m  AORTA Ao Root diam: 4.20 cm Ao Asc diam:  3.70 cm MITRAL VALVE MV Area (PHT): 3.45 cm    SHUNTS MV Decel Time: 220 msec    Systemic VTI:  0.12 m MV E velocity: 43.70 cm/s  Systemic Diam: 2.10 cm MV A velocity: 69.80 cm/s MV E/A ratio:  0.63 Jodelle Red MD Electronically signed by Jodelle Red MD Signature Date/Time: 02/12/2021/6:50:48 PM    Final     Cardiac Studies   TEE 04/2020: 1. Left ventricular ejection fraction, by estimation, is 40 to 45%. The  left ventricle has mildly decreased function. The left ventricle  demonstrates global hypokinesis.  2. Right ventricular systolic function is normal. The right ventricular  size is normal.  3. Bilobed atrial appendage.. No left atrial/left atrial appendage  thrombus was detected.  4. The mitral valve is normal in structure. Trivial mitral valve  regurgitation.  5. The aortic valve is tricuspid. Aortic valve regurgitation is not  visualized.  6. There is mild (Grade II) layered plaque involving the transverse and  descending aorta.  7. Agitated saline contrast bubble study was negative, with no evidence  of any interatrial shunt.   LHC 12/2018:  Prox RCA to Mid RCA lesion is 50% stenosed.  Dist RCA lesion is 30% stenosed.  2nd Mrg lesion is 40% stenosed.  Ost Cx to Prox Cx lesion is 30% stenosed.  Ost LAD to Prox LAD lesion is 50% stenosed.  Prox LAD lesion is 50% stenosed.  Ost 1st Diag lesion is 70% stenosed.  Mid LAD lesion is 30% stenosed.  Ost LM to Mid LM lesion is 30% stenosed.  There is mild to moderate left ventricular systolic dysfunction.  The left ventricular ejection fraction is 35-45% by visual estimate.  There is no mitral valve regurgitation.  LV end diastolic pressure is normal.  Prox RCA lesion is 30% stenosed.  1. The left main has a mild distal stenosis 2. The LAD courses to the apex. The proximal vessel has complex  aneurysmal disease. There is a 50% stenosis in the proximal LAD followed by an aneurysmal segment. There is another 50% stenosis followed by an aneurysmal segment. There is a small diagonal that arises from the aneurysmal segment with severe ostial stenosis (too small for PCI). The LAD overall appears unchanged from his last cath in 2014. The proximal lesions do not appear to be flow limiting.  3. The Circumflex is a large caliber vessel with mild ostial stenosis, non-obstructive disease in the obtuse marginal branch.  4. The RCA is a large dominant vessel with mild proximal disease followed by an aneurysmal segment. There appears to be a healed plaque in the mid RCA just beyond the first aneurysmal segment. The mid vessel has a 50% stenosis followed by another aneurysmal segment. This is grossly unchanged from his cath in 2014. The lesions do not appear to be flow limiting.  5. Moderate segmental LV systolic dysfunction  Recommendations: Complex CAD as described above with no clear change in anatomy since cath in 2014. I do not see any focal targets for PCI. I would recommend medical management of his CAD.   Diagnostic Dominance: Right      Echo IMPRESSIONS    1. Left ventricular ejection fraction, by estimation, is 45 to 50%. The  left ventricle has mildly decreased function. The left ventricle  demonstrates global hypokinesis. There is mild concentric left ventricular  hypertrophy. Left ventricular diastolic  parameters are indeterminate.  2. Right ventricular systolic function is low normal. The right  ventricular size is mildly enlarged.  3. The mitral valve is normal in structure. Trivial mitral valve  regurgitation. No evidence of mitral stenosis.  4. The aortic valve is tricuspid. Aortic valve regurgitation is not  visualized. No aortic stenosis is present.  5. The inferior vena cava is normal in size with greater than 50%  respiratory variability, suggesting right  atrial pressure of 3 mmHg.   Comparison(s): Changes from prior study are noted. EF improved slightly  from prior.   Patient Profile     80 y.o. male with history of CAD, CHF, renal and splenic embolic infarcts - on chronic Xarelto. Presents with prolonged chest pain and elevated troponins c/w NSTEMI  Assessment &  Plan    1. 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 He reports this felt similar to prior MI, though patient has no history of PCI in the past. He has known diffuse mild-moderate CAD with 70% D1 stenosis which has been medically managed. HsTrop [id number] . EKG is non-ischemic. He was started on a heparin gtt and transferred to [facility] for admission. Incidentally COVID-19 positive. Symptoms c/w NSTEMI. He took his last dose of xarelto around 6pm 02/11/21.  - Continue heparin gtt - Will plan for LHC today - Continue aspirin and statin - Continue imdur and carvedilol  2. Chronic combined CHF: no complaints of volume overload. EF 40-45% on TEE 04/2020. Echo yesterday slightly better with EF 45-50%. Noted to have allergy to ACEi so ARB/ARNI have been avoided. CXR without acute findings - Continue carvedilol, hydralazine, and imdur - Monitor volume status closely with strict I&Os and daily weights  3. HTN: BP elevated on  admission. Improved now - Will resume home carvedilol, hydralazine, and imdur - Adjust dose as needed  4. HLD: LDL 29 here.  - On crestor 20 mg daily. - Continue gemfibrazole - Check FLP in AM  5. 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 Now 7.9%. Does not appear to be on medications at home.  - Would consider adding SGLT2-inhibitor this admission  6. COVID-19 infection: found to be positive for COVID-19 this admission. He was treated with antibiotics, albuterol inhaler, and tessalon pearls for a cough in January 2022, though was not tested for COVID-19 at that time. Reports cough has  since resolved. No complaints of fevers, rhinorrhea, loss of taste/smell, or diarrhea recently.  - Will consult IM for input on management, though suspect this is not an active infection given recent symptoms c/f COVID infection 12/2020.   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 He has been on xarelto since that time with anticipated lifelong treatment. He had ILR placed to monitor for underlying atrial fibrillation, though has not had any afib occurrence on routine monitoring (last check 02/04/21) - Continue heparin gtt in anticipation of LHC with plans to restart xarelto afterwards   For questions or updates, please contact [facility] Please consult [email or web address] for contact info under        Signed, Leelan Rajewski [place], MD  02/13/2021, 10:02 AM

## 2021-02-13 NOTE — H&P (View-Only) (Signed)
Progress Note  Patient Name: Nathan Fernandez Date of Encounter: 02/13/2021  [facility] Cardiologist: Charlton Haws, MD   Subjective   No further chest pain. No dyspnea or cough.  Inpatient Medications    Scheduled Meds: . aspirin EC  81 mg Oral Daily  . buPROPion  150 mg Oral Daily  . carvedilol  3.125 mg Oral BID WC  . dorzolamide-timolol  1 drop Both Eyes BID  . gemfibrozil  600 mg Oral BID AC  . hydrALAZINE  10 mg Oral Q12H  . insulin aspart  0-5 Units Subcutaneous QHS  . insulin aspart  0-9 Units Subcutaneous TID WC  . isosorbide mononitrate  15 mg Oral Daily  . polyvinyl alcohol  1 drop Left Eye QHS  . rosuvastatin  40 mg Oral QHS  . sodium chloride flush  3 mL Intravenous Q12H  . traZODone  200 mg Oral QHS   Continuous Infusions: . sodium chloride    . sodium chloride    . heparin 1,700 Units/hr (02/13/21 0318)  . remdesivir 100 mg in NS 100 mL     PRN Meds: sodium chloride, acetaminophen, albuterol, benzonatate, nitroGLYCERIN, ondansetron (ZOFRAN) IV, sodium chloride flush   Vital Signs    Vitals:   02/12/21 1953 02/12/21 2319 02/13/21 0330 02/13/21 0750  BP: 117/75 117/78 103/72 (!) 148/93  Pulse: 76 70 66 74  Resp: 18 19 17 18   Temp: 98.3 F (36.8 C) 98.3 F (36.8 C) 98.3 F (36.8 C) 97.6 F (36.4 C)  TempSrc: Axillary Axillary Axillary Oral  SpO2: 92% 90% 91% 90%  Weight:      Height:        Intake/Output Summary (Last 24 hours) at 02/13/2021 1002 Last data filed at 02/12/2021 1906 Gross per 24 hour  Intake 787.7 ml  Output --  Net 787.7 ml   Last 3 Weights 02/11/2021 01/18/2021 11/12/2020  Weight (lbs) 165 lb 168 lb 170 lb  Weight (kg) 74.844 kg 76.204 kg 77.111 kg      Telemetry    NSR - Personally Reviewed  ECG    NSR with RBBB- Personally Reviewed  Physical Exam   GEN: No acute distress.   Neck: No JVD Cardiac: RRR, no murmurs, rubs, or gallops.  Respiratory: Clear to auscultation bilaterally. GI: Soft, nontender,  non-distended  MS: No edema; No deformity. Neuro:  Nonfocal  Psych: Normal affect   Labs    High Sensitivity Troponin:   Recent Labs  Lab 02/11/21 2052 02/11/21 2307 02/12/21 0622 02/12/21 1058  TROPONINIHS 19* 718* 3,329* 3,350*      Chemistry Recent Labs  Lab 02/11/21 2052 02/12/21 0622 02/13/21 0149  NA 135 138 137  K 4.3 4.1 4.2  CL 99 104 102  CO2 27 23 27   GLUCOSE 233* 166* 132*  BUN 12 9 15   CREATININE 0.88 0.75 0.86  CALCIUM 8.5* 8.7* 8.8*  PROT 6.7  --  5.5*  ALBUMIN 3.5  --  2.8*  AST 26  --  25  ALT 21  --  18  ALKPHOS 53  --  41  BILITOT 0.3  --  0.3  GFRNONAA >60 >60 >60  ANIONGAP 9 11 8      Hematology Recent Labs  Lab 02/11/21 2052 02/13/21 0149  WBC 8.9 7.3  RBC 5.00 4.52  HGB 15.3 13.8  HCT 45.7 40.8  MCV 91.4 90.3  MCH 30.6 30.5  MCHC 33.5 33.8  RDW 13.0 13.0  PLT 203 178    BNPNo  results for input(s): BNP, PROBNP in the last 168 hours.   DDimer  Recent Labs  Lab 02/12/21 1236 02/13/21 0149  DDIMER 1.08* 0.98*     Radiology    DG Chest Portable 1 View  Result Date: 02/11/2021 CLINICAL DATA:  Chest pain EXAM: PORTABLE CHEST 1 VIEW COMPARISON:  12/31/2020, 11/26/2020 FINDINGS: Emphysematous disease and bronchitic changes. No consolidation or effusion. Stable cardiomediastinal silhouette. Recording device over left chest. No pneumothorax. IMPRESSION: Emphysematous disease and bronchitic changes. Electronically Signed   By: Jasmine Pang M.D.   On: 02/11/2021 21:38   ECHOCARDIOGRAM COMPLETE  Result Date: 02/12/2021    ECHOCARDIOGRAM REPORT   Patient Name:   Nathan Fernandez Date of Exam: 02/12/2021 Medical Rec #:  [id number]     Height:       69.0 in Accession #:    [id number]    Weight:       165.0 lb Date of Birth:  05-13-1941     BSA:          1.904 m Patient Age:    79 years      BP:           152/105 mmHg Patient Gender: M             HR:           81 bpm. Exam Location:  Inpatient Procedure: 2D Echo, 3D Echo, Cardiac Doppler and  Color Doppler Indications:    R07.9* Chest pain, unspecified  History:        Patient has prior history of Echocardiogram examinations, most                 recent 05/15/2020. Acute MI and CAD, Abnormal ECG, COPD,                 Arrythmias:Atrial Fibrillation and RBBB, Signs/Symptoms:Chest                 Pain; Risk Factors:Current Smoker, Dyslipidemia and                 Hypertension. Covid positive. Hypoxia.  Sonographer:    Sheralyn Boatman RDCS Referring Phys: [id number] Beatriz Stallion  Sonographer Comments: Technically difficult study due to poor echo windows. IMPRESSIONS  1. Left ventricular ejection fraction, by estimation, is 45 to 50%. The left ventricle has mildly decreased function. The left ventricle demonstrates global hypokinesis. There is mild concentric left ventricular hypertrophy. Left ventricular diastolic parameters are indeterminate.  2. Right ventricular systolic function is low normal. The right ventricular size is mildly enlarged.  3. The mitral valve is normal in structure. Trivial mitral valve regurgitation. No evidence of mitral stenosis.  4. The aortic valve is tricuspid. Aortic valve regurgitation is not visualized. No aortic stenosis is present.  5. The inferior vena cava is normal in size with greater than 50% respiratory variability, suggesting right atrial pressure of 3 mmHg. Comparison(s): Changes from prior study are noted. EF improved slightly from prior. FINDINGS  Left Ventricle: Left ventricular ejection fraction, by estimation, is 45 to 50%. The left ventricle has mildly decreased function. The left ventricle demonstrates global hypokinesis. The left ventricular internal cavity size was normal in size. There is  mild concentric left ventricular hypertrophy. Left ventricular diastolic parameters are indeterminate. Right Ventricle: The right ventricular size is mildly enlarged. Right vetricular wall thickness was not well visualized. Right ventricular systolic function is low normal.  Left Atrium: Left atrial size was normal in size. Right Atrium: Right  atrial size was normal in size. Pericardium: There is no evidence of pericardial effusion. Presence of pericardial fat pad. Mitral Valve: The mitral valve is normal in structure. Trivial mitral valve regurgitation. No evidence of mitral valve stenosis. Tricuspid Valve: The tricuspid valve is normal in structure. Tricuspid valve regurgitation is trivial. No evidence of tricuspid stenosis. Aortic Valve: The aortic valve is tricuspid. Aortic valve regurgitation is not visualized. No aortic stenosis is present. Pulmonic Valve: The pulmonic valve was not well visualized. Pulmonic valve regurgitation is not visualized. Aorta: The aortic root, ascending aorta and aortic arch are all structurally normal, with no evidence of dilitation or obstruction. Venous: The inferior vena cava is normal in size with greater than 50% respiratory variability, suggesting right atrial pressure of 3 mmHg. IAS/Shunts: The atrial septum is grossly normal.  LEFT VENTRICLE PLAX 2D LVIDd:         4.60 cm     Diastology LVIDs:         3.50 cm     LV e' medial:    6.00 cm/s LV PW:         1.40 cm     LV E/e' medial:  7.3 LV IVS:        1.30 cm     LV e' lateral:   6.08 cm/s LVOT diam:     2.10 cm     LV E/e' lateral: 7.2 LV SV:         41 LV SV Index:   21 LVOT Area:     3.46 cm  LV Volumes (MOD) LV vol d, MOD A2C: 50.8 ml LV vol d, MOD A4C: 58.3 ml LV vol s, MOD A2C: 32.5 ml LV vol s, MOD A4C: 25.8 ml LV SV MOD A2C:     18.2 ml LV SV MOD A4C:     58.3 ml LV SV MOD BP:      24.6 ml RIGHT VENTRICLE             IVC RV S prime:     15.60 cm/s  IVC diam: 1.90 cm TAPSE (M-mode): 1.7 cm LEFT ATRIUM             Index       RIGHT ATRIUM           Index LA diam:        3.70 cm 1.94 cm/m  RA Area:     11.20 cm LA Vol (A2C):   36.0 ml 18.91 ml/m RA Volume:   22.30 ml  11.71 ml/m LA Vol (A4C):   38.9 ml 20.43 ml/m LA Biplane Vol: 40.6 ml 21.33 ml/m  AORTIC VALVE LVOT Vmax:   71.10  cm/s LVOT Vmean:  47.700 cm/s LVOT VTI:    0.117 m  AORTA Ao Root diam: 4.20 cm Ao Asc diam:  3.70 cm MITRAL VALVE MV Area (PHT): 3.45 cm    SHUNTS MV Decel Time: 220 msec    Systemic VTI:  0.12 m MV E velocity: 43.70 cm/s  Systemic Diam: 2.10 cm MV A velocity: 69.80 cm/s MV E/A ratio:  0.63 Jodelle Red MD Electronically signed by Jodelle Red MD Signature Date/Time: 02/12/2021/6:50:48 PM    Final     Cardiac Studies   TEE 04/2020: 1. Left ventricular ejection fraction, by estimation, is 40 to 45%. The  left ventricle has mildly decreased function. The left ventricle  demonstrates global hypokinesis.  2. Right ventricular systolic function is normal. The right ventricular  size is normal.  3. Bilobed atrial appendage.. No left atrial/left atrial appendage  thrombus was detected.  4. The mitral valve is normal in structure. Trivial mitral valve  regurgitation.  5. The aortic valve is tricuspid. Aortic valve regurgitation is not  visualized.  6. There is mild (Grade II) layered plaque involving the transverse and  descending aorta.  7. Agitated saline contrast bubble study was negative, with no evidence  of any interatrial shunt.   LHC 12/2018:  Prox RCA to Mid RCA lesion is 50% stenosed.  Dist RCA lesion is 30% stenosed.  2nd Mrg lesion is 40% stenosed.  Ost Cx to Prox Cx lesion is 30% stenosed.  Ost LAD to Prox LAD lesion is 50% stenosed.  Prox LAD lesion is 50% stenosed.  Ost 1st Diag lesion is 70% stenosed.  Mid LAD lesion is 30% stenosed.  Ost LM to Mid LM lesion is 30% stenosed.  There is mild to moderate left ventricular systolic dysfunction.  The left ventricular ejection fraction is 35-45% by visual estimate.  There is no mitral valve regurgitation.  LV end diastolic pressure is normal.  Prox RCA lesion is 30% stenosed.  1. The left main has a mild distal stenosis 2. The LAD courses to the apex. The proximal vessel has complex  aneurysmal disease. There is a 50% stenosis in the proximal LAD followed by an aneurysmal segment. There is another 50% stenosis followed by an aneurysmal segment. There is a small diagonal that arises from the aneurysmal segment with severe ostial stenosis (too small for PCI). The LAD overall appears unchanged from his last cath in 2014. The proximal lesions do not appear to be flow limiting.  3. The Circumflex is a large caliber vessel with mild ostial stenosis, non-obstructive disease in the obtuse marginal branch.  4. The RCA is a large dominant vessel with mild proximal disease followed by an aneurysmal segment. There appears to be a healed plaque in the mid RCA just beyond the first aneurysmal segment. The mid vessel has a 50% stenosis followed by another aneurysmal segment. This is grossly unchanged from his cath in 2014. The lesions do not appear to be flow limiting.  5. Moderate segmental LV systolic dysfunction  Recommendations: Complex CAD as described above with no clear change in anatomy since cath in 2014. I do not see any focal targets for PCI. I would recommend medical management of his CAD.   Diagnostic Dominance: Right      Echo IMPRESSIONS    1. Left ventricular ejection fraction, by estimation, is 45 to 50%. The  left ventricle has mildly decreased function. The left ventricle  demonstrates global hypokinesis. There is mild concentric left ventricular  hypertrophy. Left ventricular diastolic  parameters are indeterminate.  2. Right ventricular systolic function is low normal. The right  ventricular size is mildly enlarged.  3. The mitral valve is normal in structure. Trivial mitral valve  regurgitation. No evidence of mitral stenosis.  4. The aortic valve is tricuspid. Aortic valve regurgitation is not  visualized. No aortic stenosis is present.  5. The inferior vena cava is normal in size with greater than 50%  respiratory variability, suggesting right  atrial pressure of 3 mmHg.   Comparison(s): Changes from prior study are noted. EF improved slightly  from prior.   Patient Profile     80 y.o. male with history of CAD, CHF, renal and splenic embolic infarcts - on chronic Xarelto. Presents with prolonged chest pain and elevated troponins c/w NSTEMI  Assessment &  Plan    1. 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 He reports this felt similar to prior MI, though patient has no history of PCI in the past. He has known diffuse mild-moderate CAD with 70% D1 stenosis which has been medically managed. HsTrop [id number] . EKG is non-ischemic. He was started on a heparin gtt and transferred to [facility] for admission. Incidentally COVID-19 positive. Symptoms c/w NSTEMI. He took his last dose of xarelto around 6pm 02/11/21.  - Continue heparin gtt - Will plan for LHC today - Continue aspirin and statin - Continue imdur and carvedilol  2. Chronic combined CHF: no complaints of volume overload. EF 40-45% on TEE 04/2020. Echo yesterday slightly better with EF 45-50%. Noted to have allergy to ACEi so ARB/ARNI have been avoided. CXR without acute findings - Continue carvedilol, hydralazine, and imdur - Monitor volume status closely with strict I&Os and daily weights  3. HTN: BP elevated on  admission. Improved now - Will resume home carvedilol, hydralazine, and imdur - Adjust dose as needed  4. HLD: LDL 29 here.  - On crestor 20 mg daily. - Continue gemfibrazole - Check FLP in AM  5. 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 Now 7.9%. Does not appear to be on medications at home.  - Would consider adding SGLT2-inhibitor this admission  6. COVID-19 infection: found to be positive for COVID-19 this admission. He was treated with antibiotics, albuterol inhaler, and tessalon pearls for a cough in January 2022, though was not tested for COVID-19 at that time. Reports cough has  since resolved. No complaints of fevers, rhinorrhea, loss of taste/smell, or diarrhea recently.  - Will consult IM for input on management, though suspect this is not an active infection given recent symptoms c/f COVID infection 12/2020.   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 He has been on xarelto since that time with anticipated lifelong treatment. He had ILR placed to monitor for underlying atrial fibrillation, though has not had any afib occurrence on routine monitoring (last check 02/04/21) - Continue heparin gtt in anticipation of LHC with plans to restart xarelto afterwards   For questions or updates, please contact [facility] Please consult [email or web address] for contact info under        Signed, Leelan Rajewski [place], MD  02/13/2021, 10:02 AM

## 2021-02-13 NOTE — Progress Notes (Signed)
ANTICOAGULATION CONSULT NOTE - Follow Up Consult  Pharmacy Consult for heparin Indication: NSTEMI and h/o splenic infarct  Labs: Recent Labs    02/11/21 2052 02/11/21 2307 02/12/21 0622 02/12/21 1058 02/13/21 0149 02/13/21 0150  HGB 15.3  --   --   --  13.8  --   HCT 45.7  --   --   --  40.8  --   PLT 203  --   --   --  178  --   APTT  --   --  37*  --   --  39*  HEPARINUNFRC  --   --  >2.20*  --  0.42  --   CREATININE 0.88  --  0.75  --  0.86  --   TROPONINIHS 19* 718* 3,329* 3,350*  --   --     Assessment: 79yo male subtherapeutic on heparin with initial dosing while Xarelto on hold; no gtt issues or signs of bleeding per RN though IV is in [facility] and pump beeps frequently.  S/p cardiac cath today 2/23, found significant 2vessel CAD involving LAD and RCA.  Cardiologist recommends adding ASA to Xarelto, and to restart post cath 6 hr after sheath removal (sheath removed 16:20).  Cards plans to transition to Xarelto tomorrow 2/24. At  22:30 tonight will  restart Heparin at 1700 units / hr   Goal of Therapy:  aPTT 66-102 seconds   Plan:  At  22:30 tonight  restart Heparin infusion at 1700 units / hr Check PTT, HL and CBC at 0500 with daily labs.      Nicole Cella, RPh Clinical Pharmacist Please check AMION for all [place] phone numbers After 10:00 PM, call [place] [phone] 02/13/2021,5:17 PM

## 2021-02-13 NOTE — Progress Notes (Signed)
ANTICOAGULATION CONSULT NOTE - Follow Up Consult  Pharmacy Consult for heparin Indication: NSTEMI and h/o splenic infarct  Labs: Recent Labs    02/11/21 2052 02/11/21 2307 02/12/21 0622 02/12/21 1058 02/13/21 0149 02/13/21 0150  HGB 15.3  --   --   --  13.8  --   HCT 45.7  --   --   --  40.8  --   PLT 203  --   --   --  178  --   APTT  --   --  37*  --   --  39*  HEPARINUNFRC  --   --  >2.20*  --  0.42  --   CREATININE 0.88  --  0.75  --  0.86  --   TROPONINIHS 19* 718* 3,329* 3,350*  --   --     Assessment: 80yo male subtherapeutic on heparin with initial dosing while Xarelto on hold; no gtt issues or signs of bleeding per RN though IV is in [facility] and pump beeps frequently.  Goal of Therapy:  aPTT 66-102 seconds   Plan:  Will increase heparin gtt by 4 units/kg/hr to 1700 units/hr and check PTT in 8 hours.    Wynona Neat, PharmD, BCPS  02/13/2021,3:16 AM

## 2021-02-13 NOTE — Progress Notes (Signed)
[facility] hospitalist service follow-up progress note  Subjective:  Denies any cough-on room air.  Objective: Blood pressure 128/85, pulse 69, temperature 97.8 F (36.6 C), temperature source Oral, resp. rate 17, height 5\' 9"  (1.753 m), weight 74.8 kg, SpO2 91 %.  Chest: Clear to auscultation CVS: S1-S2 regular no obvious murmurs Abdomen: Soft nontender Extremity: No edema Neuro: Nonfocal  CBC Latest Ref Rng & Units 02/13/2021 02/11/2021 01/09/2021  WBC 4.0 - 10.5 K/uL 7.3 8.9 6.4  Hemoglobin 13.0 - 17.0 g/dL 13.8 15.3 16.2  Hematocrit 39.0 - 52.0 % 40.8 45.7 45.7  Platelets 150 - 400 K/uL 178 203 164   BMP Latest Ref Rng & Units 02/13/2021 02/12/2021 02/11/2021  Glucose 70 - 99 mg/dL 132(H) 166(H) 233(H)  BUN 8 - 23 mg/dL 15 9 12   Creatinine 0.61 - 1.24 mg/dL 0.86 0.75 0.88  BUN/Creat Ratio 10 - 24 - - -  Sodium 135 - 145 mmol/L 137 138 135  Potassium 3.5 - 5.1 mmol/L 4.2 4.1 4.3  Chloride 98 - 111 mmol/L 102 104 99  CO2 22 - 32 mmol/L 27 23 27   Calcium 8.9 - 10.3 mg/dL 8.8(L) 8.7(L) 8.5(L)    Assessment and plan: Incidental COVID-19 infection: Fully vaccinated including booster-asymptomatic-apart from Remdesivir x3 days does not require any further intervention.  If remains inpatient-requires 10 days of isolation.  Non-STEMI: Defer to primary service  [facility] will sign off-please reconsult if needed.

## 2021-02-13 NOTE — Progress Notes (Signed)
ANTICOAGULATION CONSULT NOTE - Follow Up Consult  Pharmacy Consult for heparin Indication: NSTEMI and h/o splenic infarct  Labs: Recent Labs    02/11/21 2052 02/11/21 2307 02/12/21 0622 02/12/21 1058 02/13/21 0149 02/13/21 0150  HGB 15.3  --   --   --  13.8  --   HCT 45.7  --   --   --  40.8  --   PLT 203  --   --   --  178  --   APTT  --   --  37*  --   --  39*  HEPARINUNFRC  --   --  >2.20*  --  0.42  --   CREATININE 0.88  --  0.75  --  0.86  --   TROPONINIHS 19* 718* 3,329* 3,350*  --   --     Assessment: 80yo male on heparin with initial dosing while Xarelto on hold.   Now s/p cardiac cath today 2/23, found significant 2vessel CAD involving LAD and RCA.  Cardiologist recommends adding ASA to Xarelto, and to restart post cath 6 hr after sheath removal (sheath removed 16:20).  Cards plans to transition to Xarelto tomorrow 2/24. At  22:30 tonight will  restart Heparin at 1700 units / hr   Goal of Therapy:  aPTT 66-102 seconds   Plan:  At  22:30 tonight  restart Heparin infusion at 1700 units / hr Check PTT, HL and CBC at 0500 with daily labs.      Nicole Cella, RPh Clinical Pharmacist Please check AMION for all [place] phone numbers After 10:00 PM, call [place] [phone] 02/13/2021,5:22 PM

## 2021-02-13 NOTE — Interval H&P Note (Signed)
Cath Lab Visit (complete for each Cath Lab visit)  Clinical Evaluation Leading to the Procedure:   ACS: Yes.    Non-ACS:   N/a    History and Physical Interval Note:  02/13/2021 3:50 PM  Nathan Fernandez  has presented today for surgery, with the diagnosis of nstemi.  The various methods of treatment have been discussed with the patient and family. After consideration of risks, benefits and other options for treatment, the patient has consented to  Procedure(s): LEFT HEART CATH AND CORONARY ANGIOGRAPHY (N/A) as a surgical intervention.  The patient's history has been reviewed, patient examined, no change in status, stable for surgery.  I have reviewed the patient's chart and labs.  Questions were answered to the patient's satisfaction.     Kathlyn [place]

## 2021-02-14 ENCOUNTER — Encounter (HOSPITAL_COMMUNITY): Payer: Self-pay | Admitting: Cardiovascular Disease

## 2021-02-14 ENCOUNTER — Telehealth: Payer: Self-pay

## 2021-02-14 DIAGNOSIS — U071 COVID-19: Secondary | ICD-10-CM | POA: Diagnosis not present

## 2021-02-14 DIAGNOSIS — I214 Non-ST elevation (NSTEMI) myocardial infarction: Secondary | ICD-10-CM | POA: Diagnosis not present

## 2021-02-14 LAB — CBC WITH DIFFERENTIAL/PLATELET
Abs Immature Granulocytes: 0.03 10*3/uL (ref 0.00–0.07)
Basophils Absolute: 0.1 10*3/uL (ref 0.0–0.1)
Basophils Relative: 1 %
Eosinophils Absolute: 0.4 10*3/uL (ref 0.0–0.5)
Eosinophils Relative: 6 %
HCT: 43 % (ref 39.0–52.0)
Hemoglobin: 14.5 g/dL (ref 13.0–17.0)
Immature Granulocytes: 0 %
Lymphocytes Relative: 34 %
Lymphs Abs: 2.6 10*3/uL (ref 0.7–4.0)
MCH: 30.2 pg (ref 26.0–34.0)
MCHC: 33.7 g/dL (ref 30.0–36.0)
MCV: 89.6 fL (ref 80.0–100.0)
Monocytes Absolute: 0.8 10*3/uL (ref 0.1–1.0)
Monocytes Relative: 10 %
Neutro Abs: 3.7 10*3/uL (ref 1.7–7.7)
Neutrophils Relative %: 49 %
Platelets: 182 10*3/uL (ref 150–400)
RBC: 4.8 MIL/uL (ref 4.22–5.81)
RDW: 13.1 % (ref 11.5–15.5)
WBC: 7.6 10*3/uL (ref 4.0–10.5)
nRBC: 0 % (ref 0.0–0.2)

## 2021-02-14 LAB — PHOSPHORUS: Phosphorus: 3.2 mg/dL (ref 2.5–4.6)

## 2021-02-14 LAB — COMPREHENSIVE METABOLIC PANEL
ALT: 17 U/L (ref 0–44)
AST: 21 U/L (ref 15–41)
Albumin: 2.9 g/dL — ABNORMAL LOW (ref 3.5–5.0)
Alkaline Phosphatase: 47 U/L (ref 38–126)
Anion gap: 11 (ref 5–15)
BUN: 13 mg/dL (ref 8–23)
CO2: 24 mmol/L (ref 22–32)
Calcium: 8.6 mg/dL — ABNORMAL LOW (ref 8.9–10.3)
Chloride: 105 mmol/L (ref 98–111)
Creatinine, Ser: 0.97 mg/dL (ref 0.61–1.24)
GFR, Estimated: >60 mL/min (ref >60)
Glucose, Bld: 122 mg/dL — ABNORMAL HIGH (ref 70–99)
Potassium: 4.1 mmol/L (ref 3.5–5.1)
Sodium: 140 mmol/L (ref 135–145)
Total Bilirubin: 0.4 mg/dL (ref 0.3–1.2)
Total Protein: 5.7 g/dL — ABNORMAL LOW (ref 6.5–8.1)

## 2021-02-14 LAB — APTT: aPTT: 76 seconds — ABNORMAL HIGH (ref 24–36)

## 2021-02-14 LAB — GLUCOSE, CAPILLARY: Glucose-Capillary: 109 mg/dL — ABNORMAL HIGH (ref 70–99)

## 2021-02-14 LAB — MAGNESIUM: Magnesium: 1.6 mg/dL — ABNORMAL LOW (ref 1.7–2.4)

## 2021-02-14 LAB — HEPARIN LEVEL (UNFRACTIONATED): Heparin Unfractionated: 0.58 IU/mL (ref 0.30–0.70)

## 2021-02-14 LAB — D-DIMER, QUANTITATIVE: D-Dimer, Quant: 1.03 ug/mL-FEU — ABNORMAL HIGH (ref 0.00–0.50)

## 2021-02-14 LAB — C-REACTIVE PROTEIN: CRP: 0.7 mg/dL (ref <1.0)

## 2021-02-14 LAB — FERRITIN: Ferritin: 853 ng/mL — ABNORMAL HIGH (ref 24–336)

## 2021-02-14 MED ORDER — GEMFIBROZIL 600 MG PO TABS: 600.0000 mg | ORAL_TABLET | Freq: Two times a day (BID) | ORAL | 6 refills | Status: DC

## 2021-02-14 MED ORDER — RIVAROXABAN 20 MG PO TABS: 20.0000 mg | ORAL_TABLET | Freq: Every day | ORAL | Status: DC

## 2021-02-14 MED ORDER — CARVEDILOL 6.25 MG PO TABS: 6.2500 mg | ORAL_TABLET | Freq: Two times a day (BID) | ORAL | 6 refills | Status: DC

## 2021-02-14 MED ORDER — CARVEDILOL 6.25 MG PO TABS
6.2500 mg | ORAL_TABLET | Freq: Two times a day (BID) | ORAL | Status: DC
Start: 2021-02-14 — End: 2021-02-14

## 2021-02-14 MED ORDER — FENOFIBRATE 48 MG PO TABS: 48.0000 mg | ORAL_TABLET | Freq: Every day | ORAL | 6 refills | Status: DC

## 2021-02-14 NOTE — Progress Notes (Signed)
Patient given discharge instructions and stated understanding. 

## 2021-02-14 NOTE — Plan of Care (Signed)
  Problem: Education: Goal: Knowledge of risk factors and measures for prevention of condition will improve Outcome: Adequate for Discharge   

## 2021-02-14 NOTE — Telephone Encounter (Signed)
Update on Xarelto PAP -  Will will need 2 documents - a print off from his pharmacy of his spending for 2022 and his proof of income (tax form or social security). Must spend 3% of income on prescriptions to be approved. If they have met these requirements, we can proceed with application.  Patient has lab appointment on 02/26/21. Please see if they would like to sign Xarelto paperwork that day. If so, I will have it ready for them at front desk. Application was mailed to patient in January. If they have this copy they can also fill it out and bring it in that day.  Debbora Dus, PharmD Clinical Pharmacist [facility] at [facility] [phone]

## 2021-02-14 NOTE — Chronic Care Management (AMB) (Addendum)
Called patient to explain necessary documents needed for Xarelto patient assistance approval. Spoke with wife. She states they have not met the 3% requirement but will follow up when they get close.   Follow-Up:  Patient Assistance Coordination and Pharmacist Review  Debbora Dus, CPP notified  Margaretmary Dys, [place] [facility] Assistant [phone]

## 2021-02-14 NOTE — Discharge Summary (Signed)
Discharge Summary    Patient ID: Nathan Fernandez MRN: [id number]; DOB: 1941-07-16  Admit date: 02/11/2021 Discharge date: 02/14/2021  Primary Care Provider: Tonia Ghent, MD  Primary Cardiologist: Jenkins Rouge, MD  Primary Electrophysiologist:  None   Discharge Diagnoses    Principal Problem:   NSTEMI (non-ST elevated myocardial infarction) [facility]) Active Problems:   COVID-19 virus infection   Allergies Allergies  Allergen Reactions  . Ace Inhibitors Swelling    Swelling of the tongue  . Angiotensin Receptor Blockers Swelling    Tongue swelling  . Enalapril Swelling    Tongue swelling    Diagnostic Studies/Procedures    CARDIAC CATH: 02/13/2021  Prox RCA lesion is 50% stenosed.  Prox RCA to Mid RCA lesion is 60% stenosed.  Dist RCA lesion is 20% stenosed.  Ost LM to Mid LM lesion is 30% stenosed.  Ost Cx to Prox Cx lesion is 30% stenosed.  Mid Cx to Dist Cx lesion is 40% stenosed.  Prox LAD to Mid LAD lesion is 70% stenosed.  Mid LM to Dist LM lesion is 30% stenosed.  1st Diag lesion is 60% stenosed.   1.  Significant two-vessel coronary artery disease involving LAD and RCA.  The LAD stenosis is between 2 large aneurysmal segments and thus its difficult to estimate stenosis severity.  The coronary arteries are overall heavily calcified. 2.  Normal left ventricular end-diastolic pressure.  Left ventricular angiography was not performed.  EF was mildly reduced by echo.  Recommendations: I do not see significant change in coronary anatomy since 2020.  It is hard to know the culprit for non-ST elevation myocardial infarction.  Recommend adding aspirin to Xarelto. Resume heparin drip 6 hours after sheath pull and transition to Xarelto tomorrow. The LAD is not suitable for PCI.  ECHO: 02/12/2021 1. Left ventricular ejection fraction, by estimation, is 45 to 50%. The  left ventricle has mildly decreased function. The left ventricle  demonstrates global  hypokinesis. There is mild concentric left ventricular  hypertrophy. Left ventricular diastolic  parameters are indeterminate.  2. Right ventricular systolic function is low normal. The right  ventricular size is mildly enlarged.  3. The mitral valve is normal in structure. Trivial mitral valve  regurgitation. No evidence of mitral stenosis.  4. The aortic valve is tricuspid. Aortic valve regurgitation is not  visualized. No aortic stenosis is present.  5. The inferior vena cava is normal in size with greater than 50%  respiratory variability, suggesting right atrial pressure of 3 mmHg.   Comparison(s): Changes from prior study are noted. EF improved slightly  from prior.  _____________   History of Present Illness     Nathan Fernandez is a 80 y.o. 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 Presented 02/22 from [facility] with prolonged chest pain and elevated troponins c/w NSTEMI.   Hospital Course     Consultants: IM  An IM consult was called to evaluate him because he was COVID +. He was seen at [facility] for hemoptysis on 01/09/21 and had a clear CXR, treated as COPD exacerbation with prednisone and Albuterol/Tessalon (not COVID tested).  His cough resolved with the Tessalon and he has had none since. The COVID was felt to be incidental, he has had 2 immunizations and no sx. WBCs ok, no fever but ferritin and d-dimer were elevated. Procalitonin was low. He was given remdesivir. He is to be on isolation for 10 days.  He was pain-free on IV heparin, ASA, nitrates. Coreg was increased. Echo w/ EF 45-50%, global HK. This is unchanged from 04/2020.   Cardiac cath performed 02/23, results above. Diffuse multi-vessel disease, no good PCI targets. If sx not controlled on medical therapy, may need to be referred for CABG.  His A1c is elevated above previous levels. He will be given information on a diabetic diet and is to f/u with his PCP. Consider  SGLT-2 inhibitor.  On 02/24, he was seen by Dr Martinique and all data reviewed. He was pain-free on current therapy, including ASA, BB (increased), high-dose statin and nitrates. Due to concern for drug interactions, Lopid was changed to Tricor.  No further inpatient workup indicated and he is considered stable for discharge, to follow up as an outpt once Covid isolation completed.  Did the patient have an acute coronary syndrome (MI, NSTEMI, STEMI, etc) this admission?:  Yes                               AHA/ACC Clinical Performance & Quality Measures: 1. Aspirin prescribed? - Yes 2. ADP Receptor Inhibitor (Plavix/Clopidogrel, Brilinta/Ticagrelor or Effient/Prasugrel) prescribed (includes medically managed patients)? - No - Xarelto 3. Beta Blocker prescribed? - Yes 4. High Intensity Statin (Lipitor 40-80mg  or Crestor 20-40mg ) prescribed? - Yes 5. EF assessed during THIS hospitalization? - Yes 6. For EF <40%, was ACEI/ARB prescribed? - Not Applicable (EF >/= 29%) 7. For EF <40%, Aldosterone Antagonist (Spironolactone or Eplerenone) prescribed? - Not Applicable (EF >/= 79%) 8. Cardiac Rehab Phase II ordered (including medically managed patients)? - Yes   _____________  Discharge Vitals Blood pressure (!) 148/92, pulse 80, temperature (!) 97.5 F (36.4 C), temperature source Oral, resp. rate 19, height 5\' 9"  (1.753 m), weight 75.1 kg, SpO2 93 %.  Filed Weights   02/11/21 2048 02/14/21 0558  Weight: 74.8 kg 75.1 kg    Labs & Radiologic Studies    CBC Recent Labs    02/13/21 0149 02/14/21 0253  WBC 7.3 7.6  NEUTROABS 3.4 3.7  HGB 13.8 14.5  HCT 40.8 43.0  MCV 90.3 89.6  PLT 178 892   Basic Metabolic Panel Recent Labs    02/13/21 0149 02/14/21 0253  NA 137 140  K 4.2 4.1  CL 102 105  CO2 27 24  GLUCOSE 132* 122*  BUN 15 13  CREATININE 0.86 0.97  CALCIUM 8.8* 8.6*  MG 1.7 1.6*  PHOS 3.4 3.2   Liver Function Tests Recent Labs    02/13/21 0149 02/14/21 0253  AST 25  21  ALT 18 17  ALKPHOS 41 47  BILITOT 0.3 0.4  PROT 5.5* 5.7*  ALBUMIN 2.8* 2.9*   No results for input(s): LIPASE, AMYLASE in the last 72 hours. High Sensitivity Troponin:   Recent Labs  Lab 02/11/21 2052 02/11/21 2307 02/12/21 0622 02/12/21 1058  TROPONINIHS 19* 718* 3,329* 3,350*    D-Dimer Recent Labs    02/13/21 0149 02/14/21 0253  DDIMER 0.98* 1.03*   Hemoglobin A1C Recent Labs    02/12/21 0622  HGBA1C 7.9*   Fasting Lipid Panel Recent Labs    02/13/21 0149  CHOL 87  HDL 33*  LDLCALC 29  TRIG 126  CHOLHDL 2.6   Thyroid Function Tests Recent Labs    02/12/21 0622  TSH 3.054   _____________  CARDIAC CATHETERIZATION  Result Date: 02/14/2021  Prox RCA lesion is 50% stenosed.  Prox RCA to Mid  RCA lesion is 60% stenosed.  Dist RCA lesion is 20% stenosed.  Ost LM to Mid LM lesion is 30% stenosed.  Ost Cx to Prox Cx lesion is 30% stenosed.  Mid Cx to Dist Cx lesion is 40% stenosed.  Prox LAD to Mid LAD lesion is 70% stenosed.  Mid LM to Dist LM lesion is 30% stenosed.  1st Diag lesion is 60% stenosed.  1.  Significant two-vessel coronary artery disease involving LAD and RCA.  The LAD stenosis is between 2 large aneurysmal segments and thus its difficult to estimate stenosis severity.  The coronary arteries are overall heavily calcified. 2.  Normal left ventricular end-diastolic pressure.  Left ventricular angiography was not performed.  EF was mildly reduced by echo. Recommendations: I do not see significant change in coronary anatomy since 2020.  It is hard to know the culprit for non-ST elevation myocardial infarction.  Recommend adding aspirin to Xarelto. Resume heparin drip 6 hours after sheath pull and transition to Xarelto tomorrow. The LAD is not suitable for PCI.   DG Chest Portable 1 View  Result Date: 02/11/2021 CLINICAL DATA:  Chest pain EXAM: PORTABLE CHEST 1 VIEW COMPARISON:  12/31/2020, 11/26/2020 FINDINGS: Emphysematous disease and bronchitic  changes. No consolidation or effusion. Stable cardiomediastinal silhouette. Recording device over left chest. No pneumothorax. IMPRESSION: Emphysematous disease and bronchitic changes. Electronically Signed   By: Donavan Foil M.D.   On: 02/11/2021 21:38   ECHOCARDIOGRAM COMPLETE  Result Date: 02/12/2021    ECHOCARDIOGRAM REPORT   Patient Name:   Nathan Fernandez Date of Exam: 02/12/2021 Medical Rec #:  [id number]     Height:       69.0 in Accession #:    [id number]    Weight:       165.0 lb Date of Birth:  06/30/1941     BSA:          1.904 m Patient Age:    46 years      BP:           152/105 mmHg Patient Gender: M             HR:           81 bpm. Exam Location:  Inpatient Procedure: 2D Echo, 3D Echo, Cardiac Doppler and Color Doppler Indications:    R07.9* Chest pain, unspecified  History:        Patient has prior history of Echocardiogram examinations, most                 recent 05/15/2020. Acute MI and CAD, Abnormal ECG, COPD,                 Arrythmias:Atrial Fibrillation and RBBB, Signs/Symptoms:Chest                 Pain; Risk Factors:Current Smoker, Dyslipidemia and                 Hypertension. Covid positive. Hypoxia.  Sonographer:    [place] Referring Phys: [id number] Abigail Butts  Sonographer Comments: Technically difficult study due to poor echo windows. IMPRESSIONS  1. Left ventricular ejection fraction, by estimation, is 45 to 50%. The left ventricle has mildly decreased function. The left ventricle demonstrates global hypokinesis. There is mild concentric left ventricular hypertrophy. Left ventricular diastolic parameters are indeterminate.  2. Right ventricular systolic function is low normal. The right ventricular size is mildly enlarged.  3. The mitral valve is normal in structure. Trivial mitral valve  regurgitation. No evidence of mitral stenosis.  4. The aortic valve is tricuspid. Aortic valve regurgitation is not visualized. No aortic stenosis is present.  5. The inferior vena cava is  normal in size with greater than 50% respiratory variability, suggesting right atrial pressure of 3 mmHg. Comparison(s): Changes from prior study are noted. EF improved slightly from prior. FINDINGS  Left Ventricle: Left ventricular ejection fraction, by estimation, is 45 to 50%. The left ventricle has mildly decreased function. The left ventricle demonstrates global hypokinesis. The left ventricular internal cavity size was normal in size. There is  mild concentric left ventricular hypertrophy. Left ventricular diastolic parameters are indeterminate. Right Ventricle: The right ventricular size is mildly enlarged. Right vetricular wall thickness was not well visualized. Right ventricular systolic function is low normal. Left Atrium: Left atrial size was normal in size. Right Atrium: Right atrial size was normal in size. Pericardium: There is no evidence of pericardial effusion. Presence of pericardial fat pad. Mitral Valve: The mitral valve is normal in structure. Trivial mitral valve regurgitation. No evidence of mitral valve stenosis. Tricuspid Valve: The tricuspid valve is normal in structure. Tricuspid valve regurgitation is trivial. No evidence of tricuspid stenosis. Aortic Valve: The aortic valve is tricuspid. Aortic valve regurgitation is not visualized. No aortic stenosis is present. Pulmonic Valve: The pulmonic valve was not well visualized. Pulmonic valve regurgitation is not visualized. Aorta: The aortic root, ascending aorta and aortic arch are all structurally normal, with no evidence of dilitation or obstruction. Venous: The inferior vena cava is normal in size with greater than 50% respiratory variability, suggesting right atrial pressure of 3 mmHg. IAS/Shunts: The atrial septum is grossly normal.  LEFT VENTRICLE PLAX 2D LVIDd:         4.60 cm     Diastology LVIDs:         3.50 cm     LV e' medial:    6.00 cm/s LV PW:         1.40 cm     LV E/e' medial:  7.3 LV IVS:        1.30 cm     LV e' lateral:    6.08 cm/s LVOT diam:     2.10 cm     LV E/e' lateral: 7.2 LV SV:         41 LV SV Index:   21 LVOT Area:     3.46 cm  LV Volumes (MOD) LV vol d, MOD A2C: 50.8 ml LV vol d, MOD A4C: 58.3 ml LV vol s, MOD A2C: 32.5 ml LV vol s, MOD A4C: 25.8 ml LV SV MOD A2C:     18.2 ml LV SV MOD A4C:     58.3 ml LV SV MOD BP:      24.6 ml RIGHT VENTRICLE             IVC RV S prime:     15.60 cm/s  IVC diam: 1.90 cm TAPSE (M-mode): 1.7 cm LEFT ATRIUM             Index       RIGHT ATRIUM           Index LA diam:        3.70 cm 1.94 cm/m  RA Area:     11.20 cm LA Vol (A2C):   36.0 ml 18.91 ml/m RA Volume:   22.30 ml  11.71 ml/m LA Vol (A4C):   38.9 ml 20.43 ml/m LA Biplane Vol: 40.6 ml 21.33  ml/m  AORTIC VALVE LVOT Vmax:   71.10 cm/s LVOT Vmean:  47.700 cm/s LVOT VTI:    0.117 m  AORTA Ao Root diam: 4.20 cm Ao Asc diam:  3.70 cm MITRAL VALVE MV Area (PHT): 3.45 cm    SHUNTS MV Decel Time: 220 msec    Systemic VTI:  0.12 m MV E velocity: 43.70 cm/s  Systemic Diam: 2.10 cm MV A velocity: 69.80 cm/s MV E/A ratio:  0.63 Buford Dresser MD Electronically signed by Buford Dresser MD Signature Date/Time: 02/12/2021/6:50:48 PM    Final    CUP PACEART REMOTE DEVICE CHECK  Result Date: 02/06/2021 ILR summary report received. Battery status OK. Normal device function. No new symptom, tachy, brady, or pause episodes. No new AF episodes. Monthly summary reports and ROV/PRN Kathy Breach, RN, CCDS, [employer]  Disposition   Pt is being discharged home today in improved condition.  Follow-up Plans & Appointments     Follow-up Information    Schedule an appointment as soon as possible for a visit with Tonia Ghent, MD.   Specialty: Family Medicine Why: Your blood sugars are elevated, Dr Damita Dunnings needs to treat this. See him ASAP. Contact information: [place] [address] [phone]        Josue Hector, MD.   Specialty: Cardiology Contact information: [phone] [place] [address] [phone]              Discharge Instructions    (HEART FAILURE PATIENTS) Call MD:  Anytime you have any of the following symptoms: 1) 3 pound weight gain in 24 hours or 5 pounds in 1 week 2) shortness of breath, with or without a dry hacking cough 3) swelling in the hands, feet or stomach 4) if you have to sleep on extra pillows at night in order to breathe.   Complete by: As directed    Call MD for:  redness, tenderness, or signs of infection (pain, swelling, redness, odor or green/yellow discharge around incision site)   Complete by: As directed    Diet - low sodium heart healthy   Complete by: As directed    Diet Carb Modified   Complete by: As directed    Increase activity slowly   Complete by: As directed       Discharge Medications   Allergies as of 02/14/2021      Reactions   Ace Inhibitors Swelling   Swelling of the tongue   Angiotensin Receptor Blockers Swelling   Tongue swelling   Enalapril Swelling   Tongue swelling      Medication List    STOP taking these medications   amoxicillin-clavulanate 875-125 MG tablet Commonly known as: Augmentin   gemfibrozil 600 MG tablet Commonly known as: LOPID     TAKE these medications   albuterol 108 (90 Base) MCG/ACT inhaler Commonly known as: VENTOLIN HFA Inhale 1-2 puffs into the lungs every 6 (six) hours as needed for shortness of breath.   aspirin 81 MG EC tablet Take 81 mg by mouth daily. What changed: Another medication with the same name was removed. Continue taking this medication, and follow the directions you see here.   benzonatate 200 MG capsule Commonly known as: TESSALON Take 1 capsule (200 mg total) by mouth 3 (three) times daily as needed. What changed: reasons to take this   buPROPion 150 MG 12 hr tablet Commonly known as: WELLBUTRIN SR Take 1 tablet (150 mg total)  by mouth daily.   carvedilol 6.25 MG tablet Commonly known as: COREG Take 1 tablet  (6.25 mg total) by mouth in the morning and at bedtime. What changed: how much to take   cyclobenzaprine 5 MG tablet Commonly known as: FLEXERIL Take 1 tablet (5 mg total) by mouth 3 (three) times daily as needed for muscle spasms.   dorzolamide-timolol 22.3-6.8 MG/ML ophthalmic solution Commonly known as: COSOPT Place 1 drop into both eyes 2 (two) times daily.   ELDERBERRY PO Take 1,250 mg by mouth daily.   fenofibrate 48 MG tablet Commonly known as: Tricor Take 1 tablet (48 mg total) by mouth daily.   hydrALAZINE 10 MG tablet Commonly known as: APRESOLINE Take 1 tablet (10 mg total) by mouth every 12 (twelve) hours.   isosorbide mononitrate 30 MG 24 hr tablet Commonly known as: IMDUR TAKE 1/2 TABLET BY MOUTH EVERY DAY   nitroGLYCERIN 0.4 MG SL tablet Commonly known as: NITROSTAT Place 1 tablet (0.4 mg total) under the tongue every 5 (five) minutes as needed for chest pain. Up to 3 doses   rosuvastatin 20 MG tablet Commonly known as: Crestor Take 1 tablet (20 mg total) by mouth daily.   Systane 0.4-0.3 % Gel ophthalmic gel Generic drug: Polyethyl Glycol-Propyl Glycol Place 1 application into the left eye at bedtime.   traZODone 100 MG tablet Commonly known as: DESYREL TAKE 2 TABLETS (200 MG TOTAL) BY MOUTH AT BEDTIME.   vitamin C 1000 MG tablet Take 500 mg by mouth daily.   Vitamin D 125 MCG (5000 UT) Caps Take 5,000 Units by mouth daily.   Xarelto 20 MG Tabs tablet Generic drug: rivaroxaban TAKE 1 TABLET BY MOUTH DAILY WITH SUPPER. What changed: how much to take   zinc gluconate 50 MG tablet Take 50 mg by mouth daily.          Outstanding Labs/Studies   None  Duration of Discharge Encounter   Greater than 30 minutes including physician time.  Signed, Rosaria Ferries, PA-C 02/14/2021, 10:18 AM

## 2021-02-14 NOTE — Discharge Instructions (Signed)
WEIGH DAILY, every am, wearing the same amount of clothing Record weights, and bring it to office appointments Contact Jenkins Rouge, MD for weight gain of 3 lbs in a day or 5 lbs in a week Limit sodium to 500 mg per meal, total 2000 mg per day Limit all liquids to 1.5-2 liters/quarts per day    If you have any return of chest pain, abdominal pain, vomiting or other new concerning symptom, return to ER for reassessment.  PLEASE REMEMBER TO BRING ALL OF YOUR MEDICATIONS TO EACH OF YOUR FOLLOW-UP OFFICE VISITS.  PLEASE ATTEND ALL SCHEDULED FOLLOW-UP APPOINTMENTS.   Activity: Increase activity slowly as tolerated. You may shower, but no soaking baths (or swimming) for 1 week. No driving for 1 week. No lifting over 5 lbs for 2 weeks. No sexual activity for 1 week.   You May Return to Work: in 3 weeks (if applicable)  Wound Care: You may wash cath site gently with soap and water. Keep cath site clean and dry. If you notice pain, swelling, bleeding or pus at your cath site, please call [phone].    Cardiac Cath Site Care Refer to this sheet in the next few weeks. These instructions provide you with information on caring for yourself after your procedure. Your caregiver may also give you more specific instructions. Your treatment has been planned according to current medical practices, but problems sometimes occur. Call your caregiver if you have any problems or questions after your procedure. HOME CARE INSTRUCTIONS  You may shower 24 hours after the procedure. Remove the bandage (dressing) and gently wash the site with plain soap and water. Gently pat the site dry.   Do not apply powder or lotion to the site.   Do not sit in a bathtub, swimming pool, or whirlpool for 5 to 7 days.   No bending, squatting, or lifting anything over 10 pounds (4.5 kg) as directed by your caregiver.   Inspect the site at least twice daily.   Do not drive home if you are discharged the same day of the  procedure. Have someone else drive you.   You may drive 24 hours after the procedure unless otherwise instructed by your caregiver.  What to expect:  Any bruising will usually fade within 1 to 2 weeks.   Blood that collects in the tissue (hematoma) may be painful to the touch. It should usually decrease in size and tenderness within 1 to 2 weeks.  SEEK IMMEDIATE MEDICAL CARE IF:  You have unusual pain at the site or down the affected limb.   You have redness, warmth, swelling, or pain at the site.   You have drainage (other than a small amount of blood on the dressing).   You have chills.   You have a fever or persistent symptoms for more than 72 hours.   You have a fever and your symptoms suddenly get worse.   Your leg becomes pale, cool, tingly, or numb.   You have heavy bleeding from the site. Hold pressure on the site.  Document Released: 01/10/2011 Document Revised: 11/27/2011 Document Reviewed:

## 2021-02-14 NOTE — Progress Notes (Signed)
Progress Note  Patient Name: Nathan Fernandez Date of Encounter: 02/14/2021  [facility] Cardiologist: Jenkins Rouge, MD   Subjective   No further chest pain. No dyspnea or cough.  Inpatient Medications    Scheduled Meds: . aspirin EC  81 mg Oral Daily  . buPROPion  150 mg Oral Daily  . carvedilol  6.25 mg Oral BID WC  . dorzolamide-timolol  1 drop Both Eyes BID  . gemfibrozil  600 mg Oral BID AC  . hydrALAZINE  10 mg Oral Q12H  . insulin aspart  0-5 Units Subcutaneous QHS  . insulin aspart  0-9 Units Subcutaneous TID WC  . isosorbide mononitrate  15 mg Oral Daily  . polyvinyl alcohol  1 drop Left Eye QHS  . rivaroxaban  20 mg Oral Q supper  . rosuvastatin  20 mg Oral QHS  . sodium chloride flush  3 mL Intravenous Q12H  . sodium chloride flush  3 mL Intravenous Q12H  . traZODone  200 mg Oral QHS   Continuous Infusions: . sodium chloride    . heparin 1,700 Units/hr (02/13/21 2250)  . remdesivir 100 mg in NS 100 mL 100 mg (02/13/21 1048)   PRN Meds: sodium chloride, acetaminophen, albuterol, benzonatate, nitroGLYCERIN, ondansetron (ZOFRAN) IV, sodium chloride flush   Vital Signs    Vitals:   02/13/21 2247 02/13/21 2248 02/14/21 0250 02/14/21 0558  BP: 131/88 131/88  133/81  Pulse:  80  78  Resp:  17  16  Temp:    98 F (36.7 C)  TempSrc:    Oral  SpO2:  (!) 89% (!) 88% 93%  Weight:    75.1 kg  Height:        Intake/Output Summary (Last 24 hours) at 02/14/2021 0947 Last data filed at 02/14/2021 0559 Gross per 24 hour  Intake 13 ml  Output 475 ml  Net -462 ml   Last 3 Weights 02/14/2021 02/11/2021 01/18/2021  Weight (lbs) 165 lb 9.6 oz 165 lb 168 lb  Weight (kg) 75.116 kg 74.844 kg 76.204 kg      Telemetry    NSR - Personally Reviewed  ECG    NSR with RBBB- Personally Reviewed  Physical Exam   GEN: No acute distress.   Neck: No JVD Cardiac: RRR, no murmurs, rubs, or gallops.  Respiratory: Clear to auscultation bilaterally. GI: Soft, nontender,  non-distended  MS: No edema; No deformity. Neuro:  Nonfocal  Psych: Normal affect   Labs    High Sensitivity Troponin:   Recent Labs  Lab 02/11/21 2052 02/11/21 2307 02/12/21 0622 02/12/21 1058  TROPONINIHS 19* 718* 3,329* 3,350*      Chemistry Recent Labs  Lab 02/11/21 2052 02/12/21 0622 02/13/21 0149 02/14/21 0253  NA 135 138 137 140  K 4.3 4.1 4.2 4.1  CL 99 104 102 105  CO2 27 23 27 24   GLUCOSE 233* 166* 132* 122*  BUN 12 9 15 13   CREATININE 0.88 0.75 0.86 0.97  CALCIUM 8.5* 8.7* 8.8* 8.6*  PROT 6.7  --  5.5* 5.7*  ALBUMIN 3.5  --  2.8* 2.9*  AST 26  --  25 21  ALT 21  --  18 17  ALKPHOS 53  --  41 47  BILITOT 0.3  --  0.3 0.4  GFRNONAA >60 >60 >60 >60  ANIONGAP 9 11 8 11      Hematology Recent Labs  Lab 02/11/21 2052 02/13/21 0149 02/14/21 0253  WBC 8.9 7.3 7.6  RBC 5.00 4.52  4.80  HGB 15.3 13.8 14.5  HCT 45.7 40.8 43.0  MCV 91.4 90.3 89.6  MCH 30.6 30.5 30.2  MCHC 33.5 33.8 33.7  RDW 13.0 13.0 13.1  PLT 203 178 182    BNPNo results for input(s): BNP, PROBNP in the last 168 hours.   DDimer  Recent Labs  Lab 02/12/21 1236 02/13/21 0149 02/14/21 0253  DDIMER 1.08* 0.98* 1.03*     Radiology    CARDIAC CATHETERIZATION  Result Date: 02/14/2021  Prox RCA lesion is 50% stenosed.  Prox RCA to Mid RCA lesion is 60% stenosed.  Dist RCA lesion is 20% stenosed.  Ost LM to Mid LM lesion is 30% stenosed.  Ost Cx to Prox Cx lesion is 30% stenosed.  Mid Cx to Dist Cx lesion is 40% stenosed.  Prox LAD to Mid LAD lesion is 70% stenosed.  Mid LM to Dist LM lesion is 30% stenosed.  1st Diag lesion is 60% stenosed.  1.  Significant two-vessel coronary artery disease involving LAD and RCA.  The LAD stenosis is between 2 large aneurysmal segments and thus its difficult to estimate stenosis severity.  The coronary arteries are overall heavily calcified. 2.  Normal left ventricular end-diastolic pressure.  Left ventricular angiography was not performed.   EF was mildly reduced by echo. Recommendations: I do not see significant change in coronary anatomy since 2020.  It is hard to know the culprit for non-ST elevation myocardial infarction.  Recommend adding aspirin to Xarelto. Resume heparin drip 6 hours after sheath pull and transition to Xarelto tomorrow. The LAD is not suitable for PCI.   ECHOCARDIOGRAM COMPLETE  Result Date: 02/12/2021    ECHOCARDIOGRAM REPORT   Patient Name:   Nathan Fernandez Date of Exam: 02/12/2021 Medical Rec #:  [id number]     Height:       69.0 in Accession #:    [id number]    Weight:       165.0 lb Date of Birth:  Dec 03, 1941     BSA:          1.904 m Patient Age:    80 years      BP:           152/105 mmHg Patient Gender: M             HR:           81 bpm. Exam Location:  Inpatient Procedure: 2D Echo, 3D Echo, Cardiac Doppler and Color Doppler Indications:    R07.9* Chest pain, unspecified  History:        Patient has prior history of Echocardiogram examinations, most                 recent 05/15/2020. Acute MI and CAD, Abnormal ECG, COPD,                 Arrythmias:Atrial Fibrillation and RBBB, Signs/Symptoms:Chest                 Pain; Risk Factors:Current Smoker, Dyslipidemia and                 Hypertension. Covid positive. Hypoxia.  Sonographer:    [place] Referring Phys: [id number] Abigail Butts  Sonographer Comments: Technically difficult study due to poor echo windows. IMPRESSIONS  1. Left ventricular ejection fraction, by estimation, is 45 to 50%. The left ventricle has mildly decreased function. The left ventricle demonstrates global hypokinesis. There is mild concentric left ventricular hypertrophy. Left ventricular diastolic parameters  are indeterminate.  2. Right ventricular systolic function is low normal. The right ventricular size is mildly enlarged.  3. The mitral valve is normal in structure. Trivial mitral valve regurgitation. No evidence of mitral stenosis.  4. The aortic valve is tricuspid. Aortic valve  regurgitation is not visualized. No aortic stenosis is present.  5. The inferior vena cava is normal in size with greater than 50% respiratory variability, suggesting right atrial pressure of 3 mmHg. Comparison(s): Changes from prior study are noted. EF improved slightly from prior. FINDINGS  Left Ventricle: Left ventricular ejection fraction, by estimation, is 45 to 50%. The left ventricle has mildly decreased function. The left ventricle demonstrates global hypokinesis. The left ventricular internal cavity size was normal in size. There is  mild concentric left ventricular hypertrophy. Left ventricular diastolic parameters are indeterminate. Right Ventricle: The right ventricular size is mildly enlarged. Right vetricular wall thickness was not well visualized. Right ventricular systolic function is low normal. Left Atrium: Left atrial size was normal in size. Right Atrium: Right atrial size was normal in size. Pericardium: There is no evidence of pericardial effusion. Presence of pericardial fat pad. Mitral Valve: The mitral valve is normal in structure. Trivial mitral valve regurgitation. No evidence of mitral valve stenosis. Tricuspid Valve: The tricuspid valve is normal in structure. Tricuspid valve regurgitation is trivial. No evidence of tricuspid stenosis. Aortic Valve: The aortic valve is tricuspid. Aortic valve regurgitation is not visualized. No aortic stenosis is present. Pulmonic Valve: The pulmonic valve was not well visualized. Pulmonic valve regurgitation is not visualized. Aorta: The aortic root, ascending aorta and aortic arch are all structurally normal, with no evidence of dilitation or obstruction. Venous: The inferior vena cava is normal in size with greater than 50% respiratory variability, suggesting right atrial pressure of 3 mmHg. IAS/Shunts: The atrial septum is grossly normal.  LEFT VENTRICLE PLAX 2D LVIDd:         4.60 cm     Diastology LVIDs:         3.50 cm     LV e' medial:    6.00  cm/s LV PW:         1.40 cm     LV E/e' medial:  7.3 LV IVS:        1.30 cm     LV e' lateral:   6.08 cm/s LVOT diam:     2.10 cm     LV E/e' lateral: 7.2 LV SV:         41 LV SV Index:   21 LVOT Area:     3.46 cm  LV Volumes (MOD) LV vol d, MOD A2C: 50.8 ml LV vol d, MOD A4C: 58.3 ml LV vol s, MOD A2C: 32.5 ml LV vol s, MOD A4C: 25.8 ml LV SV MOD A2C:     18.2 ml LV SV MOD A4C:     58.3 ml LV SV MOD BP:      24.6 ml RIGHT VENTRICLE             IVC RV S prime:     15.60 cm/s  IVC diam: 1.90 cm TAPSE (M-mode): 1.7 cm LEFT ATRIUM             Index       RIGHT ATRIUM           Index LA diam:        3.70 cm 1.94 cm/m  RA Area:     11.20 cm LA Vol (A2C):  36.0 ml 18.91 ml/m RA Volume:   22.30 ml  11.71 ml/m LA Vol (A4C):   38.9 ml 20.43 ml/m LA Biplane Vol: 40.6 ml 21.33 ml/m  AORTIC VALVE LVOT Vmax:   71.10 cm/s LVOT Vmean:  47.700 cm/s LVOT VTI:    0.117 m  AORTA Ao Root diam: 4.20 cm Ao Asc diam:  3.70 cm MITRAL VALVE MV Area (PHT): 3.45 cm    SHUNTS MV Decel Time: 220 msec    Systemic VTI:  0.12 m MV E velocity: 43.70 cm/s  Systemic Diam: 2.10 cm MV A velocity: 69.80 cm/s MV E/A ratio:  0.63 Buford Dresser MD Electronically signed by Buford Dresser MD Signature Date/Time: 02/12/2021/6:50:48 PM    Final     Cardiac Studies   TEE 04/2020: 1. Left ventricular ejection fraction, by estimation, is 40 to 45%. The  left ventricle has mildly decreased function. The left ventricle  demonstrates global hypokinesis.  2. Right ventricular systolic function is normal. The right ventricular  size is normal.  3. Bilobed atrial appendage.. No left atrial/left atrial appendage  thrombus was detected.  4. The mitral valve is normal in structure. Trivial mitral valve  regurgitation.  5. The aortic valve is tricuspid. Aortic valve regurgitation is not  visualized.  6. There is mild (Grade II) layered plaque involving the transverse and  descending aorta.  7. Agitated saline contrast bubble  study was negative, with no evidence  of any interatrial shunt.   LHC 12/2018:  Prox RCA to Mid RCA lesion is 50% stenosed.  Dist RCA lesion is 30% stenosed.  2nd Mrg lesion is 40% stenosed.  Ost Cx to Prox Cx lesion is 30% stenosed.  Ost LAD to Prox LAD lesion is 50% stenosed.  Prox LAD lesion is 50% stenosed.  Ost 1st Diag lesion is 70% stenosed.  Mid LAD lesion is 30% stenosed.  Ost LM to Mid LM lesion is 30% stenosed.  There is mild to moderate left ventricular systolic dysfunction.  The left ventricular ejection fraction is 35-45% by visual estimate.  There is no mitral valve regurgitation.  LV end diastolic pressure is normal.  Prox RCA lesion is 30% stenosed.  1. The left main has a mild distal stenosis 2. The LAD courses to the apex. 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 There is a 50% stenosis in the proximal LAD followed by an aneurysmal segment. There is another 50% stenosis followed by an aneurysmal segment. There is a small diagonal that arises from the aneurysmal segment with severe ostial stenosis (too small for PCI). The LAD overall appears unchanged from his last cath in 2014. The proximal lesions do not appear to be flow limiting.  3. The Circumflex is a large caliber vessel with mild ostial stenosis, non-obstructive disease in the obtuse marginal branch.  4. The RCA is a large dominant vessel with mild proximal disease followed by an aneurysmal segment. There appears to be a healed plaque in the mid RCA just beyond the first aneurysmal segment. The mid vessel has a 50% stenosis followed by another aneurysmal segment. This is grossly unchanged from his cath in 2014. The lesions do not appear to be flow limiting.  5. Moderate segmental LV systolic dysfunction  Recommendations: Complex CAD as described above with no clear change in anatomy since cath in 2014. I do not see any focal targets for PCI. I would recommend medical management of his CAD.    Diagnostic Dominance: Right      Echo IMPRESSIONS  1. Left ventricular ejection fraction, by estimation, is 45 to 50%. The  left ventricle has mildly decreased function. The left ventricle  demonstrates global hypokinesis. There is mild concentric left ventricular  hypertrophy. Left ventricular diastolic  parameters are indeterminate.  2. Right ventricular systolic function is low normal. The right  ventricular size is mildly enlarged.  3. The mitral valve is normal in structure. Trivial mitral valve  regurgitation. No evidence of mitral stenosis.  4. The aortic valve is tricuspid. Aortic valve regurgitation is not  visualized. No aortic stenosis is present.  5. The inferior vena cava is normal in size with greater than 50%  respiratory variability, suggesting right atrial pressure of 3 mmHg.   Comparison(s): Changes from prior study are noted. EF improved slightly  from prior.   LEFT HEART CATH AND CORONARY ANGIOGRAPHY    Conclusion    Prox RCA lesion is 50% stenosed.  Prox RCA to Mid RCA lesion is 60% stenosed.  Dist RCA lesion is 20% stenosed.  Ost LM to Mid LM lesion is 30% stenosed.  Ost Cx to Prox Cx lesion is 30% stenosed.  Mid Cx to Dist Cx lesion is 40% stenosed.  Prox LAD to Mid LAD lesion is 70% stenosed.  Mid LM to Dist LM lesion is 30% stenosed.  1st Diag lesion is 60% stenosed.   1.  Significant two-vessel coronary artery disease involving LAD and RCA.  The LAD stenosis is between 2 large aneurysmal segments and thus its difficult to estimate stenosis severity.  The coronary arteries are overall heavily calcified. 2.  Normal left ventricular end-diastolic pressure.  Left ventricular angiography was not performed.  EF was mildly reduced by echo.  Recommendations: I do not see significant change in coronary anatomy since 2020.  It is hard to know the culprit for non-ST elevation myocardial infarction.  Recommend adding aspirin to  Xarelto. Resume heparin drip 6 hours after sheath pull and transition to Xarelto tomorrow. The LAD is not suitable for PCI.    Patient Profile     80 y.o. male with history of CAD, CHF, renal and splenic embolic infarcts - on chronic Xarelto. Presents with prolonged chest pain and elevated troponins c/w NSTEMI  Assessment & Plan    1. 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 He reports this felt similar to prior MI, though patient has no history of PCI in the past. He has known diffuse mild-moderate CAD with 70% D1 stenosis which has been medically managed. HsTrop [id number] . EKG is non-ischemic. He was started on a heparin gtt and transferred to [facility] for admission. Incidentally COVID-19 positive. Symptoms c/w NSTEMI.  - Cardiac cath shows moderate 3 vessel CAD with significant vessel tortuosity, calcification, and aneurysmal disease. This is really unchanged angiographically since 2019.  - Will continue medical therapy. Increase Coreg dose. Resume Xarelto - Continue aspirin and statin - Continue imdur  - Plan DC home today. If patient has refractory angina despite medical therapy would need to be considered for CABG since he has no suitable targets for PCI.   2. Chronic combined CHF: no complaints of volume overload. EF 40-45% on TEE 04/2020. Echo yesterday slightly better with EF 45-50%. Noted to have allergy to ACEi so ARB/ARNI have been avoided. CXR without acute findings - Continue carvedilol, hydralazine, and imdur - Monitor volume status closely with strict I&Os and daily weights  3. HTN: BP elevated on  admission. Improved now  4. HLD: LDL 29 here.  - On crestor 20 mg daily. - Continue gemfibrazole   5. 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 Now 7.9%. Does not appear to be on medications at home.  - dietary therapy and follow up with PCP - Would consider adding SGLT2-inhibitor  6.  COVID-19 infection: found to be positive for COVID-19 this admission. He was treated with antibiotics, albuterol inhaler, and tessalon pearls for a cough in January 2022, though was not tested for COVID-19 at that time. Asymptomatic now.   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 Resume Xarelto.    For questions or updates, please contact [place] Please consult [email or web address] for contact info under        Signed, Peter Martinique, MD  02/14/2021, 9:47 AM

## 2021-02-14 NOTE — Progress Notes (Signed)
ANTICOAGULATION CONSULT NOTE - Follow Up Consult  Pharmacy Consult for heparin Indication: NSTEMI and h/o splenic infarct  Labs: Recent Labs    02/11/21 2052 02/11/21 2307 02/12/21 0622 02/12/21 1058 02/13/21 0149 02/13/21 0150 02/14/21 0253  HGB 15.3  --   --   --  13.8  --  14.5  HCT 45.7  --   --   --  40.8  --  43.0  PLT 203  --   --   --  178  --  182  APTT  --   --  37*  --   --  39* 76*  HEPARINUNFRC  --   --  >2.20*  --  0.42  --  0.58  CREATININE 0.88  --  0.75  --  0.86  --   --   TROPONINIHS 19* 718* 3,329* 3,350*  --   --   --     Assessment: 80yo male on heparin with initial dosing while Xarelto on hold.   Now s/p cardiac cath today 2/23, found significant 2vessel CAD involving LAD and RCA.  Cardiologist recommends adding ASA to Xarelto, and to restart post cath 6 hr after sheath removal (sheath removed 16:20).  Cards plans to transition to Xarelto tomorrow 2/24. At  22:30 tonight will  restart Heparin at 1700 units / hr   2/24 AM update:  Heparin level and aPTT therapeutic  Goal of Therapy:  aPTT 66-102 seconds   Plan:  Cont heparin 1700 units/hr 1200 heparin level and aPTT     Narda Bonds, PharmD, BCPS Clinical Pharmacist Phone: [phone]

## 2021-02-14 NOTE — Telephone Encounter (Signed)
Transition Care Management Unsuccessful Follow-up Telephone Call  Date of discharge and from where:  02/14/2021, Nathan Fernandez   Attempts:  1st Attempt  Reason for unsuccessful TCM follow-up call:  Left voice message

## 2021-02-15 ENCOUNTER — Telehealth: Payer: Self-pay | Admitting: Cardiovascular Disease

## 2021-02-15 NOTE — Telephone Encounter (Signed)
Transition Care Management Follow-up Telephone Call  Date of discharge and from where: [facility]/14/2021[facility]  How have you been since you were released from the hospital? Patient had some chest pain last night that was corrected with nitroglycerin. He is resting now and doing okay  Any questions or concerns? No  Items Reviewed:  Did the pt receive and understand the discharge instructions provided? Yes   Medications obtained and verified? Yes   Other? No   Any new allergies since your discharge? No   Dietary orders reviewed? Yes  Do you have support at home? Yes   Home Care and Equipment/Supplies: Were home health services ordered? not applicable If so, what is the name of the agency? N/A  Has the agency set up a time to come to the patient's home? not applicable Were any new equipment or medical supplies ordered?  No What is the name of the medical supply agency? N/A Were you able to get the supplies/equipment? not applicable Do you have any questions related to the use of the equipment or supplies? No  Functional Questionnaire: (I = Independent and D = Dependent) ADLs: I  Bathing/Dressing- I  Meal Prep- I  Eating- I  Maintaining continence- I  Transferring/Ambulation- I  Managing Meds- I  Follow up appointments reviewed:   PCP Hospital f/u appt confirmed? Yes  Scheduled to see Dr. Damita Dunnings on 02/21/2021 @ 3:30 pm.  [facility] f/u appt confirmed? follow up with cardiology   Are transportation arrangements needed? No   If their condition worsens, is the pt aware to call PCP or go to the Emergency Dept.? Yes  Was the patient provided with contact information for the PCP's office or ED? Yes  Was to pt encouraged to call back with questions or concerns? Yes

## 2021-02-15 NOTE — Telephone Encounter (Signed)
LINQ II transmission from 02/15/21 did not show any events since 08/15/20. Wife of patient reports patient walked from living room to kitchen table and developed substernal non-radiating CP relieved by 3 SL Nitro, no nausea or diaphoresis. Patient felt " light headed" after taking 3rd SL Nitro.

## 2021-02-15 NOTE — Telephone Encounter (Signed)
Pt c/o of Chest Pain: STAT if CP now or developed within 24 hours  1. Are you having CP right now? No. Per wife patient is sleeping   2. Are you experiencing any other symptoms (ex. SOB, nausea, vomiting, sweating)? A little lightheaded   3. How long have you been experiencing CP? Wife said it started last night   4. Is your CP continuous or coming and going? Continuous   5. Have you taken Nitroglycerin? Per wife patient took 3 last night before it resolved  ?  Wife said patient was d/c from the hospital yesterday afternoon but had some chest pain last night. Wife wanted to know if anything would have shown up on the report from his loop recorder

## 2021-02-15 NOTE — Telephone Encounter (Signed)
Was just cathed no PaF on monitor

## 2021-02-15 NOTE — Telephone Encounter (Signed)
Nathan Fernandez that Dr. Johnsie Cancel has reviewed implanted loop recorder report and aware of chest pain. Continue as planned with aspirin/Xarelto and nitro PRN as needed.  Follow up on March 3 with Dr. Sharlene Motts and Coletta Memos NP at [place] office on 3/17 for cardiology. ED precautions given.   Nathan Fernandez verbalized understanding.

## 2021-02-15 NOTE — Telephone Encounter (Signed)
Transition Care Management Unsuccessful Follow-up Telephone Call  Date of discharge and from where:  02/14/2021, [facility] Fernandez  Attempts:  2nd Attempt  Reason for unsuccessful TCM follow-up call:  Left voice message

## 2021-02-15 NOTE — Telephone Encounter (Signed)
Cath by Fletcher Anon 02/13/21 Recommendations: I do not see significant change in coronary anatomy since 2020.  It is hard to know the culprit for non-ST elevation myocardial infarction.  Recommend adding aspirin to Xarelto. Resume heparin drip 6 hours after sheath pull and transition to Xarelto tomorrow. The LAD is not suitable for PCI.

## 2021-02-18 NOTE — Progress Notes (Signed)
Carelink Summary Report / Loop Recorder 

## 2021-02-20 DIAGNOSIS — H401131 Primary open-angle glaucoma, bilateral, mild stage: Secondary | ICD-10-CM | POA: Diagnosis not present

## 2021-02-21 ENCOUNTER — Encounter: Payer: Self-pay | Admitting: Family Medicine

## 2021-02-21 ENCOUNTER — Other Ambulatory Visit: Payer: Self-pay

## 2021-02-21 ENCOUNTER — Ambulatory Visit (INDEPENDENT_AMBULATORY_CARE_PROVIDER_SITE_OTHER): Payer: Medicare Other | Admitting: Family Medicine

## 2021-02-21 VITALS — BP 102/66 | HR 67 | Temp 97.8°F | Ht 69.0 in | Wt 167.0 lb

## 2021-02-21 DIAGNOSIS — I2510 Atherosclerotic heart disease of native coronary artery without angina pectoris: Secondary | ICD-10-CM

## 2021-02-21 DIAGNOSIS — M545 Low back pain, unspecified: Secondary | ICD-10-CM

## 2021-02-21 MED ORDER — ACETAMINOPHEN 325 MG PO TABS: 650.0000 mg | ORAL_TABLET | Freq: Two times a day (BID) | ORAL | Status: DC | PRN

## 2021-02-21 NOTE — Patient Instructions (Addendum)
Go to the lab on the way out.   If you have mychart we'll likely use that to update you.     Try tylenol twice day.  Use ice as needed on your back for up to 5 minutes at a time.   Let me see about options for pain and for diabetes in the meantime.    Take care.  Glad to see you.

## 2021-02-21 NOTE — Progress Notes (Signed)
This visit occurred during the SARS-CoV-2 public health emergency.  Safety protocols were in place, including screening questions prior to the visit, additional usage of staff PPE, and extensive cleaning of exam room while observing appropriate contact time as indicated for disinfecting solutions.  Inpatient f/u for NSTEMI with medical mgmt after cath.  Inpatient course and rationale discussed with patient.  He had NTG use one time after discharge, not since.  No CP.  Not SOB.    Cough got better with tessalon.  No blood in sputum.  That resolved.  No sx in the meantime.   His balance isn't as good as prev, he attributed to back pain.  Noted when getting up from the bed in the AM.  L lower back pain, near the L SI joint.  Tylenol helped a little.  Has taken flexeril with variable relief.    Meds, vitals, and allergies reviewed.   ROS: Per HPI unless specifically indicated in ROS section   GEN: nad, alert and oriented HEENT: ncat NECK: supple w/o LA CV: rrr. PULM: ctab, no inc wob ABD: soft, +bs EXT: no edema SKIN: no acute rash Normal left hip flexion but he has decreased external rotation of left hip.  Left lower back slightly tender to palpation.  35 minutes were devoted to patient care in this encounter (this includes time spent reviewing the patient's file/history, interviewing and examining the patient, counseling/reviewing plan with patient).

## 2021-02-22 LAB — BASIC METABOLIC PANEL
BUN: 12 mg/dL (ref 6–23)
CO2: 30 mEq/L (ref 19–32)
Calcium: 8.8 mg/dL (ref 8.4–10.5)
Chloride: 103 mEq/L (ref 96–112)
Creatinine, Ser: 0.84 mg/dL (ref 0.40–1.50)
GFR: 82.4 mL/min (ref >60.00)
Glucose, Bld: 150 mg/dL — ABNORMAL HIGH (ref 70–99)
Potassium: 4.2 mEq/L (ref 3.5–5.1)
Sodium: 139 mEq/L (ref 135–145)

## 2021-02-22 LAB — CBC WITH DIFFERENTIAL/PLATELET
Basophils Absolute: 0.1 10*3/uL (ref 0.0–0.1)
Basophils Relative: 1 % (ref 0.0–3.0)
Eosinophils Absolute: 0.3 10*3/uL (ref 0.0–0.7)
Eosinophils Relative: 4.2 % (ref 0.0–5.0)
HCT: 41.3 % (ref 39.0–52.0)
Hemoglobin: 14.1 g/dL (ref 13.0–17.0)
Lymphocytes Relative: 31.5 % (ref 12.0–46.0)
Lymphs Abs: 2.3 10*3/uL (ref 0.7–4.0)
MCHC: 34 g/dL (ref 30.0–36.0)
MCV: 90.5 fl (ref 78.0–100.0)
Monocytes Absolute: 0.7 10*3/uL (ref 0.1–1.0)
Monocytes Relative: 10 % (ref 3.0–12.0)
Neutro Abs: 3.8 10*3/uL (ref 1.4–7.7)
Neutrophils Relative %: 53.3 % (ref 43.0–77.0)
Platelets: 207 10*3/uL (ref 150.0–400.0)
RBC: 4.57 Mil/uL (ref 4.22–5.81)
RDW: 13.7 % (ref 11.5–15.5)
WBC: 7.2 10*3/uL (ref 4.0–10.5)

## 2021-02-23 ENCOUNTER — Other Ambulatory Visit: Payer: Self-pay | Admitting: Family Medicine

## 2021-02-23 MED ORDER — DAPAGLIFLOZIN PROPANEDIOL 5 MG PO TABS: 5.0000 mg | ORAL_TABLET | Freq: Every day | ORAL | 5 refills | Status: DC

## 2021-02-23 MED ORDER — TRAMADOL HCL 50 MG PO TABS
50.0000 mg | ORAL_TABLET | Freq: Three times a day (TID) | ORAL | 0 refills | Status: DC | PRN
Start: 2021-02-23 — End: 2021-04-16

## 2021-02-23 NOTE — Assessment & Plan Note (Signed)
With NSTEMI, with plan for medical management at the catheterization.  No chest pain.  Not short of breath.  Still on carvedilol, fenofibrate, hydralazine, isosorbide, Crestor.  Already anticoagulated with Xarelto.  Reasonable to continue those medications for now.  Recheck labs today.  We talked about a possible SGLT-2 inhibitor start but I want to check his labs first.  He agrees.

## 2021-02-23 NOTE — Assessment & Plan Note (Signed)
He could also have left SI joint pain contributing.  He may be able to tolerate tramadol but only check his labs first.  See notes on follow-up labs.

## 2021-02-26 ENCOUNTER — Other Ambulatory Visit: Payer: Medicare Other

## 2021-02-27 ENCOUNTER — Ambulatory Visit: Payer: Medicare Other | Admitting: Medical

## 2021-03-06 NOTE — Progress Notes (Signed)
Cardiology Clinic Note   Patient Name: Nathan Fernandez Date of Encounter: 03/07/2021  Primary Care Provider:  Tonia Ghent, MD Primary Cardiologist:  Jenkins Rouge, MD  Patient Profile    Nathan Fernandez 80 year old male presents to the clinic today for follow-up evaluation of his coronary artery disease status post NSTEMI.  Past Medical History    Past Medical History:  Diagnosis Date  . 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   Hosp R facial weakness  . CAD (coronary artery disease)    MI, [place], PTCA; [place] 04/18/13: Distal left main 40%, proximal LAD with several aneurysmal segments, proximal LAD 50% prior to and after aneurysmal segments, area does not appear to flow-limiting, proximal diagonal 70%, ostial circumflex 20%, proximal circumflex 20%, OM1 30-40%, proximal RCA 40%, mid RCA 30%, distal RCA 20%, EF 50% => med Rx  . COPD (chronic obstructive pulmonary disease) ([place])   . DM2 (diabetes mellitus, type 2) ([place])   . History of ETT 1998   wnl  . History of MRI 08/04/09   brain- atrophy sm vess dz  . HLD (hyperlipidemia)   . HTN (hypertension)   . OA (osteoarthritis)    Past Surgical History:  Procedure Laterality Date  . BUBBLE STUDY  05/15/2020   Procedure: BUBBLE STUDY;  Surgeon: Pixie Casino, MD;  Location: [facility] ENDOSCOPY;  Service: Cardiovascular;;  . cataract surgery  09/06   repair lens which moved  . EYE SURGERY    . LEFT HEART CATH AND CORONARY ANGIOGRAPHY N/A 01/21/2019   Procedure: LEFT HEART CATH AND CORONARY ANGIOGRAPHY;  Surgeon: Burnell Blanks, MD;  Location: [place] CV LAB;  Service: Cardiovascular;  Laterality: N/A;  . LEFT HEART CATH AND CORONARY ANGIOGRAPHY N/A 02/13/2021   Procedure: LEFT HEART CATH AND CORONARY ANGIOGRAPHY;  Surgeon: Wellington Hampshire, MD;  Location: [place] [facility] LAB;  Service: Cardiovascular;  Laterality: N/A;  . LOOP RECORDER INSERTION N/A 05/15/2020   Procedure: LOOP RECORDER INSERTION;  Surgeon: Thompson Grayer, MD;  Location:  [place] [facility] LAB;  Service: Cardiovascular;  Laterality: N/A;  . stress myoview  06/29/06   sm distal anteroseptal & apical infarct  . TEE WITHOUT CARDIOVERSION N/A 05/15/2020   Procedure: TRANSESOPHAGEAL ECHOCARDIOGRAM (TEE);  Surgeon: Pixie Casino, MD;  Location: [facility] ENDOSCOPY;  Service: Cardiovascular;  Laterality: N/A;  . THYROIDECTOMY, PARTIAL  1967   B9 growth    Allergies  Allergies  Allergen Reactions  . Ace Inhibitors Swelling    Swelling of the tongue  . Angiotensin Receptor Blockers Swelling    Tongue swelling  . 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  He was admitted to the hospital on 02/12/2021 and discharged on 02/14/2021.  He presented with prolonged chest pain and elevated troponins consistent with NSTEMI.  He underwent cardiac catheterization on 02/13/2021 which showed proximal RCA 50% stenosis mid RCA 60% stenosis, mid LM 30% stenosis, proximal circumflex 30% stenosis, distal circumflex 40% stenosis, mid LAD 70% stenosis, and first diagonal 60% stenosis.  There was no change in his coronary anatomy from his 2020 cardiac catheterization medical management was recommended.  If no relief from symptoms referral for CABG recommended.  Aspirin was added to his Xarelto regimen.  It was noted that his LAD was not suitable  for PCI.  His echocardiogram 02/12/2021 showed an EF of 45-50% left ventricle global hypokinesis, mild LVH, intermediate diastolic parameters and no significant valvular abnormalities.  He presents to the office today for follow-up evaluation states he feels well.  He is looking forward to warmer weather so that he may get outside and do some yard work.  He reports that at the current time he is not very physically  active.  He reports that on the day of discharge from the hospital he had one episode of chest discomfort.  He has not had any further episodes of chest discomfort or arm, neck or throat pain.  He reports compliance with his medications.  He does state that he has some difficulty remembering his medications but uses his wife to help with his medication dosing.  We reviewed sublingual nitroglycerin dosing and reviewed isosorbide mononitrate.  We also reviewed his angiography.  They expressed understanding.  I will have him return in 3 months, give the salty 6 diet sheet, and increase his physical activity as tolerated.  Today he denies chest pain, shortness of breath, lower extremity edema, fatigue, palpitations, melena, hematuria, hemoptysis, diaphoresis, weakness, presyncope, syncope, orthopnea, and PND.    Home Medications    Prior to Admission medications   Medication Sig Start Date End Date Taking? Authorizing Provider  acetaminophen (TYLENOL) 325 MG tablet Take 2 tablets (650 mg total) by mouth 2 (two) times daily as needed. 02/21/21   Tonia Ghent, MD  albuterol (VENTOLIN HFA) 108 (90 Base) MCG/ACT inhaler Inhale 1-2 puffs into the lungs every 6 (six) hours as needed for shortness of breath. 09/06/19   Tonia Ghent, MD  Ascorbic Acid (VITAMIN C) 1000 MG tablet Take 500 mg by mouth daily.    [provider]  aspirin 81 MG EC tablet Take 81 mg by mouth daily.    [provider]  benzonatate (TESSALON) 200 MG capsule Take 1 capsule (200 mg total) by mouth 3 (three) times daily as needed. 01/18/21   Tonia Ghent, MD  buPROPion [facility] SR) 150 MG 12 hr tablet Take 1 tablet (150 mg total) by mouth daily. 09/07/20   Tonia Ghent, MD  carvedilol (COREG) 6.25 MG tablet Take 1 tablet (6.25 mg total) by mouth in the morning and at bedtime. 02/14/21   Barrett, Evelene Croon, PA-C  Cholecalciferol (VITAMIN D) 125 MCG (5000 UT) CAPS Take 5,000 Units by mouth daily.    [provider]  cyclobenzaprine (FLEXERIL) 5 MG tablet Take 1 tablet (5 mg total) by mouth 3 (three) times daily as needed for muscle spasms. 07/07/19   Tonia Ghent, MD  dapagliflozin propanediol (FARXIGA) 5 MG TABS tablet Take 1 tablet (5 mg total) by mouth daily before breakfast. 02/23/21   Tonia Ghent, MD  dorzolamide-timolol (COSOPT) 22.3-6.8 MG/ML ophthalmic solution Place 1 drop into both eyes 2 (two) times daily. 02/13/20   [provider]  ELDERBERRY PO Take 1,250 mg by mouth daily.    [provider]  fenofibrate (TRICOR) 48 MG tablet Take 1 tablet (48 mg total) by mouth daily. 02/14/21   Barrett, Evelene Croon, PA-C  hydrALAZINE (APRESOLINE) 10 MG tablet Take 1 tablet (10 mg total) by mouth every 12 (twelve) hours. 05/02/20 05/02/21  Richardson Dopp T, PA-C  isosorbide mononitrate (IMDUR) 30 MG 24 hr tablet TAKE 1/2 TABLET BY MOUTH EVERY DAY 05/07/20   Josue Hector, MD  nitroGLYCERIN (NITROSTAT) 0.4 MG SL tablet Place 1 tablet (0.4  mg total) under the tongue every 5 (five) minutes as needed for chest pain. Up to 3 doses 02/12/21   Tonia Ghent, MD  Polyethyl Glycol-Propyl Glycol (SYSTANE) 0.4-0.3 % GEL ophthalmic gel Place 1 application into the left eye at bedtime.    [provider]  rosuvastatin (CRESTOR) 20 MG tablet Take 1 tablet (20 mg total) by mouth daily. 01/16/21   Tonia Ghent, MD  traMADol (ULTRAM) 50 MG tablet Take 1 tablet (50 mg total) by mouth every 8 (eight) hours as needed (For pain not relieved by Tylenol). 02/23/21   Tonia Ghent, MD  traZODone (DESYREL) 100 MG tablet TAKE 2 TABLETS (200 MG TOTAL) BY MOUTH AT BEDTIME. 01/02/21   Tonia Ghent, MD  XARELTO 20 MG TABS tablet TAKE 1 TABLET BY MOUTH DAILY WITH SUPPER. 01/03/21   Brunetta Genera, MD  zinc gluconate 50 MG tablet Take 50 mg by mouth daily.    [provider]    Family History    Family History  Problem Relation Age of Onset  . Hypertension Mother   .  Aneurysm Mother   . Stroke Mother   . Hypertension Father   . Heart disease Father        CAD  . Heart disease Son        MI  . Colon cancer Neg Hx   . Prostate cancer Neg Hx    He indicated that his mother is deceased. He indicated that his father is deceased. He indicated that the status of his brother is unknown and reported the following: unknown, minimal contact. He indicated that his daughter is deceased. He indicated that his son is alive. He indicated that the status of his neg hx is unknown.  Social History    Social History   Socioeconomic History  . Marital status: Married    Spouse name: Not on file  . Number of children: 4  . Years of education: Not on file  . Highest education level: Not on file  Occupational History  . 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 Occupation: retired  Tobacco Use  . Smoking status: Former Smoker    Types: Cigars  . Smokeless tobacco: Never Used  . Tobacco comment: cigar occassionally  Vaping Use  . Vaping Use: Never used  Substance and Sexual Activity  . Alcohol use: Not Currently    Alcohol/week: 0.0 standard drinks  . Drug use: No  . Sexual activity: Not on file  Other Topics Concern  . 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 Difficulty of Paying Living Expenses: Hard  Food Insecurity: No Food Insecurity  . Worried About Charity fundraiser in the Last Year: Never true  . Ran Out of Food in the Last Year: Never true  Transportation Needs: No Transportation Needs  . Lack of Transportation (Medical): No  . Lack of Transportation (Non-Medical): No  Physical Activity: Inactive  . Days of Exercise per Week:  0 days  . Minutes of Exercise per Session: 0 min  Stress: No Stress Concern Present  . Feeling of Stress : Not at all  Social  Connections: Not on file  Intimate Partner Violence: Not At Risk  . Fear of Current or Ex-Partner: No  . Emotionally Abused: No  . Physically Abused: No  . Sexually Abused: No     Review of Systems    General:  No chills, fever, night sweats or weight changes.  Cardiovascular:  No chest pain, dyspnea on exertion, edema, orthopnea, palpitations, paroxysmal nocturnal dyspnea. Dermatological: No rash, lesions/masses Respiratory: No cough, dyspnea Urologic: No hematuria, dysuria Abdominal:   No nausea, vomiting, diarrhea, bright red blood per rectum, melena, or hematemesis Neurologic:  No visual changes, wkns, changes in mental status. All other systems reviewed and are otherwise negative except as noted above.  Physical Exam    VS:  BP 110/64   Pulse 72   Ht 5\' 9"  (1.753 m)   Wt 171 lb 3.2 oz (77.7 kg)   SpO2 92%   BMI 25.28 kg/m  , BMI Body mass index is 25.28 kg/m. GEN: Well nourished, well developed, in no acute distress. HEENT: normal. Neck: Supple, no JVD, carotid bruits, or masses. Cardiac: RRR, no murmurs, rubs, or gallops. No clubbing, cyanosis, edema.  Radials/DP/PT 2+ and equal bilaterally.  Respiratory:  Respirations regular and unlabored, clear to auscultation bilaterally. GI: Soft, nontender, nondistended, BS + x 4. MS: no deformity or atrophy. Skin: warm and dry, no rash. Neuro:  Strength and sensation are intact. Psych: Normal affect.  Accessory Clinical Findings    Recent Labs: 02/12/2021: TSH 3.054 02/14/2021: ALT 17; Magnesium 1.6 02/21/2021: BUN 12; Creatinine, Ser 0.84; Hemoglobin 14.1; Platelets 207.0; Potassium 4.2; Sodium 139   Recent Lipid Panel    Component Value Date/Time   CHOL 87 02/13/2021 0149   TRIG 126 02/13/2021 0149   HDL 33 (L) 02/13/2021 0149   CHOLHDL 2.6 02/13/2021 0149   VLDL 25 02/13/2021 0149    LDLCALC 29 02/13/2021 0149   LDLDIRECT 64.0 08/31/2020 0925    ECG personally reviewed by me today-none today.  Echocardiogram 02/12/2021 1. Left ventricular ejection fraction, by estimation, is 45 to 50%. The  left ventricle has mildly decreased function. The left ventricle  demonstrates global hypokinesis. There is mild concentric left ventricular  hypertrophy. Left ventricular diastolic  parameters are indeterminate.  2. Right ventricular systolic function is low normal. The right  ventricular size is mildly enlarged.  3. The mitral valve is normal in structure. Trivial mitral valve  regurgitation. No evidence of mitral stenosis.  4. The aortic valve is tricuspid. Aortic valve regurgitation is not  visualized. No aortic stenosis is present.  5. The inferior vena cava is normal in size with greater than 50%  respiratory variability, suggesting right atrial pressure of 3 mmHg.   Comparison(s): Changes from prior study are noted. EF improved slightly  from prior.   Cardiac catheterization 02/13/2021  Prox RCA lesion is 50% stenosed.  Prox RCA to Mid RCA lesion is 60% stenosed.  Dist RCA lesion is 20% stenosed.  Ost LM to Mid LM lesion is 30% stenosed.  Ost Cx to Prox Cx lesion is 30% stenosed.  Mid Cx to Dist Cx lesion is 40% stenosed.  Prox LAD to Mid LAD lesion is 70% stenosed.  Mid LM to Dist LM lesion is 30% stenosed.  1st Diag lesion is 60% stenosed.   Diagnostic Dominance: Right    Intervention    Assessment & Plan   1.  Nonobstructive coronary artery disease-status post NSTEMI 02/12/2021.  Underwent cardiac catheterization 02/13/2021 which showed no changes in coronary anatomy  from 2020 study.  Medical management recommended.  Aspirin added to Xarelto.  Refer for CABG if no relief in symptoms. Continue carvedilol, hydralazine, Imdur Heart healthy low-sodium diet-salty 6 given Increase physical activity as tolerated  Chronic combined systolic and  diastolic CHF-no increased DOE or activity tolerance today.  Echocardiogram 02/12/2021 showed LVEF of 45-50%, intermediate diastolic parameters.  Allergy to ACE.  Not a candidate for ACE ARB [place]. Continue carvedilol, hydralazine, Imdur Heart healthy low-sodium diet-salty 6 given Increase physical activity as tolerated Daily weights-contact office with a weight increase of 3 pounds overnight or 5 pounds in 1 week.  Hyperlipidemia-02/13/2021: Cholesterol 87; HDL 33; LDL Cholesterol 29; Triglycerides 126; VLDL 25 Continue rosuvastatin, fenofibrate Heart healthy low-sodium high-fiber diet Increase physical activity as tolerated  Essential hypertension-BP today 110/64.  Well-controlled at home. Continue hydralazine, Imdur, carvedilol, Heart healthy low-sodium diet-salty 6 given Increase physical activity as tolerated   Disposition: Follow-up with Dr.Nishan in 3 months.  Jossie Ng. Katey Barrie NP-C    03/07/2021, 11:34 AM [place] [address] Office [phone] Fax [phone]  Notice: This dictation was prepared with Dragon dictation along with smaller phrase technology. Any transcriptional errors that result from this process are unintentional and may not be corrected upon review.  I spent 12 minutes examining this patient, reviewing medications, and using patient centered shared decision making involving her cardiac care.  Prior to her visit I spent greater than 20 minutes reviewing her past medical history,  medications, and prior cardiac tests.

## 2021-03-07 ENCOUNTER — Other Ambulatory Visit: Payer: Self-pay

## 2021-03-07 ENCOUNTER — Encounter: Payer: Self-pay | Admitting: General Practice

## 2021-03-07 ENCOUNTER — Ambulatory Visit (INDEPENDENT_AMBULATORY_CARE_PROVIDER_SITE_OTHER): Payer: Medicare Other | Admitting: General Practice

## 2021-03-07 VITALS — BP 110/64 | HR 72 | Ht 69.0 in | Wt 171.2 lb

## 2021-03-07 DIAGNOSIS — I2510 Atherosclerotic heart disease of native coronary artery without angina pectoris: Secondary | ICD-10-CM

## 2021-03-07 DIAGNOSIS — E785 Hyperlipidemia, unspecified: Secondary | ICD-10-CM

## 2021-03-07 DIAGNOSIS — I5022 Chronic systolic (congestive) heart failure: Secondary | ICD-10-CM

## 2021-03-07 DIAGNOSIS — I10 Essential (primary) hypertension: Secondary | ICD-10-CM

## 2021-03-07 NOTE — Patient Instructions (Signed)
Medication Instructions:  The current medical regimen is effective;  continue present plan and medications as directed. Please refer to the Current Medication list given to you today.  *If you need a refill on your cardiac medications before your next appointment, please call your pharmacy*  Lab Work:   Testing/Procedures:  NONE    NONE  Special Instructions PLEASE READ AND FOLLOW SALTY 6-ATTACHED-1,800mg  daily  PLEASE INCREASE PHYSICAL ACTIVITY AS TOLERATED  Follow-Up: Your next appointment:  3 month(s) In Person with Jenkins Rouge, MD OR IF UNAVAILABLE Devol, FNP-C  At [facility], you and your health needs are our priority.  As part of our continuing mission to provide you with exceptional heart care, we have created designated Provider Care Teams.  These Care Teams include your primary Cardiologist (physician) and Advanced Practice Providers (APPs -  Physician Assistants and Nurse Practitioners) who all work together to provide you with the care you need, when you need it.            6 SALTY THINGS TO AVOID     1,800MG  DAILY

## 2021-03-11 ENCOUNTER — Other Ambulatory Visit: Payer: Self-pay | Admitting: Family Medicine

## 2021-03-12 ENCOUNTER — Other Ambulatory Visit: Payer: Self-pay | Admitting: Hematology

## 2021-03-12 NOTE — Telephone Encounter (Signed)
Last seen May of 2021, he was referred by you to PCP (Dr. Damita Dunnings) for management of this med. However, you did refill this med 2 months ago. Please review for refill.

## 2021-03-12 NOTE — Telephone Encounter (Signed)
Hi Beth, Would recommend patient f/u with PCP for continued Xarelto refills since he would not be actively continuing to f/u with [place] at this time and the decision for long term anticoagulation has already been made. Thanks Brewer

## 2021-03-16 LAB — CUP PACEART REMOTE DEVICE CHECK
Date Time Interrogation Session: 20220319000959
Implantable Pulse Generator Implant Date: 20210525

## 2021-03-18 ENCOUNTER — Ambulatory Visit (INDEPENDENT_AMBULATORY_CARE_PROVIDER_SITE_OTHER): Payer: Medicare Other

## 2021-03-18 DIAGNOSIS — I639 Cerebral infarction, unspecified: Secondary | ICD-10-CM | POA: Diagnosis not present

## 2021-03-20 ENCOUNTER — Telehealth: Payer: Self-pay

## 2021-03-20 NOTE — Chronic Care Management (AMB) (Addendum)
    Chronic Care Management Pharmacy Assistant   Name: Nathan Fernandez  MRN: [id number] DOB: 1941-06-03   Reason for Encounter: Patient Assistance Follow-up- Xarelto     Medications: Outpatient Encounter Medications as of 03/20/2021  Medication Sig   acetaminophen (TYLENOL) 325 MG tablet Take 2 tablets (650 mg total) by mouth 2 (two) times daily as needed.   albuterol (VENTOLIN HFA) 108 (90 Base) MCG/ACT inhaler Inhale 1-2 puffs into the lungs every 6 (six) hours as needed for shortness of breath.   Ascorbic Acid (VITAMIN C) 1000 MG tablet Take 500 mg by mouth daily.   aspirin 81 MG EC tablet Take 81 mg by mouth daily.   benzonatate (TESSALON) 200 MG capsule Take 1 capsule (200 mg total) by mouth 3 (three) times daily as needed.   buPROPion (WELLBUTRIN SR) 150 MG 12 hr tablet Take 1 tablet (150 mg total) by mouth daily.   carvedilol (COREG) 6.25 MG tablet Take 1 tablet (6.25 mg total) by mouth in the morning and at bedtime.   Cholecalciferol (VITAMIN D) 125 MCG (5000 UT) CAPS Take 5,000 Units by mouth daily.   cyclobenzaprine (FLEXERIL) 5 MG tablet Take 1 tablet (5 mg total) by mouth 3 (three) times daily as needed for muscle spasms.   dapagliflozin propanediol (FARXIGA) 5 MG TABS tablet Take 1 tablet (5 mg total) by mouth daily before breakfast.   dorzolamide-timolol (COSOPT) 22.3-6.8 MG/ML ophthalmic solution Place 1 drop into both eyes 2 (two) times daily.   ELDERBERRY PO Take 1,250 mg by mouth daily.   fenofibrate (TRICOR) 48 MG tablet Take 1 tablet (48 mg total) by mouth daily.   hydrALAZINE (APRESOLINE) 10 MG tablet Take 1 tablet (10 mg total) by mouth every 12 (twelve) hours.   isosorbide mononitrate (IMDUR) 30 MG 24 hr tablet TAKE 1/2 TABLET BY MOUTH EVERY DAY   nitroGLYCERIN (NITROSTAT) 0.4 MG SL tablet Place 1 tablet (0.4 mg total) under the tongue every 5 (five) minutes as needed for chest pain. Up to 3 doses   Polyethyl Glycol-Propyl Glycol (SYSTANE) 0.4-0.3 % GEL ophthalmic gel  Place 1 application into the left eye at bedtime.   rosuvastatin (CRESTOR) 20 MG tablet Take 1 tablet (20 mg total) by mouth daily.   traMADol (ULTRAM) 50 MG tablet Take 1 tablet (50 mg total) by mouth every 8 (eight) hours as needed (For pain not relieved by Tylenol).   traZODone (DESYREL) 100 MG tablet TAKE 2 TABLETS (200 MG TOTAL) BY MOUTH AT BEDTIME.   XARELTO 20 MG TABS tablet TAKE 1 TABLET BY MOUTH DAILY WITH SUPPER.   zinc gluconate 50 MG tablet Take 50 mg by mouth daily.   No facility-administered encounter medications on file as of 03/20/2021.    Contacted [employer]  to follow up on patient assistance application for Xarelto. Per representative at [place] states they have not received any portion of the application.   Will follow up next month.  Follow-Up:  Patient Assistance Coordination and Pharmacist Review  Debbora Dus, CPP notified  Margaretmary Dys, [place] [phone]  Total time spent for month: CPA 20

## 2021-03-25 NOTE — Telephone Encounter (Signed)
Pt decided last month they had not reached 4% out of pocket requirement for Xarelto assistance program. They will call when they have reached this. Provider portion of application was faxed to [employer] on 01/29/21  to [phone]. Transmission successful. I have the application on file for resubmission once patient is ready.  Debbora Dus, PharmD Clinical Pharmacist [facility] at [facility] [phone]

## 2021-03-26 ENCOUNTER — Telehealth: Payer: Self-pay | Admitting: Family Medicine

## 2021-03-26 ENCOUNTER — Other Ambulatory Visit: Payer: Self-pay | Admitting: Cardiovascular Disease

## 2021-03-26 ENCOUNTER — Other Ambulatory Visit: Payer: Self-pay | Admitting: Physician Assistant

## 2021-03-26 NOTE — Telephone Encounter (Signed)
Patient needs refill on Flexeril 5 mg tablets.   LOV - 03/12/21 Next OV -  not scheduled Last RF - 07/08/2019

## 2021-03-26 NOTE — Telephone Encounter (Signed)
  LAST APPOINTMENT DATE: 03/12/2021   NEXT APPOINTMENT DATE:@Visit  date not found  MEDICATION: flexeril(would have to call),  xarelto  PH[facility]ACY: [facility]  [address]  Let patient know to contact pharmacy at the end of the day to make sure medication is ready.  Please notify patient to allow 48-72 hours to process  Encourage patient to contact the pharmacy for refills or they can request refills through [place]:   LAST REFILL:  QTY:  REFILL DATE:    OTHER COMMENTS:    Okay for refill?  Please advise

## 2021-03-27 MED ORDER — CYCLOBENZAPRINE HCL 5 MG PO TABS: 5.0000 mg | ORAL_TABLET | Freq: Three times a day (TID) | ORAL | 2 refills | Status: DC | PRN

## 2021-03-27 NOTE — Telephone Encounter (Signed)
Sent. Thanks.   

## 2021-03-28 NOTE — Progress Notes (Signed)
Carelink Summary Report / Loop Recorder 

## 2021-04-02 ENCOUNTER — Telehealth: Payer: Self-pay

## 2021-04-02 DIAGNOSIS — M545 Low back pain, unspecified: Secondary | ICD-10-CM

## 2021-04-02 NOTE — Telephone Encounter (Signed)
Notified patients wife that referral was placed.

## 2021-04-02 NOTE — Telephone Encounter (Signed)
Pts wife (DPR signed)left v/m that pt has had back pain for several months and Dr Damita Dunnings has been treating pt for back pain and pts back pain is worsening and not getting any better. pts wife request either MRI or referral to orthopedics. Pt last seen 02/21/21 and please see 02/21/21 lab results.pts wife request cb after DR Damita Dunnings reviews the note.

## 2021-04-02 NOTE — Telephone Encounter (Signed)
I think it makes sense to see ortho.  I put in the referral.  Thanks.

## 2021-04-03 ENCOUNTER — Ambulatory Visit: Payer: Medicare Other | Admitting: Surgery

## 2021-04-03 ENCOUNTER — Ambulatory Visit: Payer: Self-pay

## 2021-04-03 ENCOUNTER — Encounter: Payer: Self-pay | Admitting: Surgery

## 2021-04-03 VITALS — BP 126/75 | HR 60 | Ht 69.0 in | Wt 171.2 lb

## 2021-04-03 DIAGNOSIS — G8929 Other chronic pain: Secondary | ICD-10-CM

## 2021-04-03 DIAGNOSIS — M4726 Other spondylosis with radiculopathy, lumbar region: Secondary | ICD-10-CM

## 2021-04-03 DIAGNOSIS — M545 Low back pain, unspecified: Secondary | ICD-10-CM

## 2021-04-03 NOTE — Progress Notes (Signed)
Office Visit Note   Patient: Nathan Fernandez           Date of Birth: 05/22/1941           MRN: [id number] Visit Date: 04/03/2021              Requested by: Tonia Ghent, MD [address] [place],  [place] [address] PCP: Tonia Ghent, MD   Assessment & Plan: Visit Diagnoses:  1. Chronic left-sided low back pain without sciatica   2. Other spondylosis with radiculopathy, lumbar region     Plan: Since patient has continued to have ongoing chronic low back pain that is failed conservative treatment I recommend getting a lumbar MRI to rule out HNP/stenosis.  We will need to see if he can proceed with having a lumbar MRI with the loop recorder insertion that was done May 2021.  There is also question of a possible cardiac stent that was placed in the mid 1990s.  He can continue medication management per Dr. Damita Dunnings.  With his history of diabetes I did not want to give a Medrol Dosepak taper. Advised him to avoid any specific activities that aggravate his pain which includes using the riding lawn more.  Follow-up with Dr. Louanne Skye in 3 weeks to review lumbar MRI and discuss treatment options.  All questions answered.  Follow-Up Instructions: Return in about 3 weeks (around 04/24/2021) for with dr Louanne Skye to review lumbar mri scan.   Orders:  Orders Placed This Encounter  Procedures  . XR Lumbar Spine 2-3 Views  . MR Lumbar Spine w/o contrast   No orders of the defined types were placed in this encounter.     Procedures: No procedures performed   Clinical Data: No additional findings.   Subjective: Chief Complaint  Patient presents with  . Lower Back - Pain    HPI 80 year old white male who is new patient to clinic comes in today with complaints of worsening low back pain.  Patient states that his primary care physician Dr. Elsie Stain has been treating him for his low back pain off and on for the last couple years.  He has had conservative management with tramadol,  Flexeril, Tylenol.  patient takes Xarelto for history of blood clots so he is unable to take oral NSAIDs.  States that his back pain is constant at times but aggravated with activity.  He recently use his riding lawn more and had consider amount of discomfort in the left side of his low back after this.  Not describing any neurogenic claudication symptoms.  No complaints of numbness and tingling in the legs.  He has had intermittent episodes of pain in the left buttock that goes down to just above the knee but this is not frequent.  Patient has an implanted loop recorder performed by Dr. Rayann Heman May 15, 2020.  Cardiologist Dr. Johnsie Cancel.   Review of Systems No current cardiac pulmonary GI GU  Objective: Vital Signs: BP 126/75   Pulse 60   Ht 5\' 9"  (1.753 m)   Wt 171 lb 3.2 oz (77.7 kg)   BMI 25.28 kg/m   Physical Exam HENT:     Head: Normocephalic.  Eyes:     Extraocular Movements: Extraocular movements intact.  Pulmonary:     Effort: No respiratory distress.  Musculoskeletal:     Comments: Gait is normal.  Patient has mild left lumbar paraspinal tenderness/spasm.  Positive left-sided notch tenderness.  Negative logroll bilateral hips.  Nontender  over the bilateral SI joints.  Pain with left straight leg raise.  No focal motor deficits.  Neurological:     General: No focal deficit present.     Mental Status: He is alert and oriented to person, place, and time.  Psychiatric:        Mood and Affect: Mood normal.     Ortho Exam  Specialty Comments:  No specialty comments available.  Imaging: No results found.   PMFS History: Patient Active Problem List   Diagnosis Date Noted  . Non-ST elevation MI (NSTEMI) ([place]) 02/12/2021  . NSTEMI (non-ST elevated myocardial infarction) ([place]) 02/12/2021  . COVID-19 virus infection 02/12/2021  . Long term (current) use of anticoagulants 05/27/2020  . 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 Hemoptysis  07/10/2019  . Abnormal cardiovascular stress test   . Tremor 09/02/2018  . Advance care planning 09/02/2018  . Cold intolerance 09/02/2018  . Dry mouth 03/12/2018  . Shoulder pain 01/05/2018  . Hypoxia 12/29/2017  . Wrist pain 12/29/2017  . Lower back pain 02/18/2017  . Lower urinary tract symptoms (LUTS) 02/11/2016  . Bilateral knee pain 05/25/2015  . Benign paroxysmal positional vertigo 02/18/2015  . Abdominal wall symptom 02/18/2015  . Gout 05/04/2014  . Healthcare maintenance 04/18/2014  . COPD (chronic obstructive pulmonary disease) ([place]) 03/25/2013  . CAD (coronary artery disease) 03/03/2013  . Other malaise and fatigue 02/22/2013  . Chest wall pain 05/31/2012  . Heartburn 05/07/2011  . Depression 11/28/2010  . 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 Diabetes mellitus ([place]) 03/18/2010  . 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 HYPERCHOLESTEROLEMIA 10/19/2007  . 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 HYPERTENSION, BENIGN 10/19/2007  . ROSACEA 10/19/2007  . Osteoarthritis 10/19/2007  . Insomnia 10/19/2007   Past Medical History:  Diagnosis Date  . 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   Hosp R facial weakness  . CAD (coronary artery disease)    MI, [place], PTCA; [place] 04/18/13: Distal left main 40%, proximal LAD with several aneurysmal segments, proximal LAD 50% prior to and after aneurysmal segments, area does not appear to flow-limiting, proximal diagonal 70%, ostial circumflex 20%, proximal circumflex 20%, OM1 30-40%, proximal RCA 40%, mid RCA 30%, distal RCA 20%, EF 50% => med Rx  . COPD (chronic obstructive pulmonary disease) ([place])   . DM2 (diabetes mellitus, type 2) ([place])   . History of ETT 1998   wnl  . History of MRI 08/04/09   brain- atrophy sm vess dz  . HLD (hyperlipidemia)   . HTN (hypertension)   . OA (osteoarthritis)     Family History  Problem Relation Age of Onset  . Hypertension Mother   . Aneurysm Mother   .  Stroke Mother   . Hypertension Father   . Heart disease Father        CAD  . Heart disease Son        MI  . Colon cancer Neg Hx   . Prostate cancer Neg Hx     Past Surgical History:  Procedure Laterality Date  . BUBBLE STUDY  05/15/2020   Procedure: BUBBLE STUDY;  Surgeon: Pixie Casino, MD;  Location: [facility]COPY;  Service: Cardiovascular;;  . cataract surgery  09/06   repair lens which moved  . EYE SURGERY    . LEFT HEART CATH AND CORONARY ANGIOGRAPHY N/A 01/21/2019   Procedure: LEFT HEART CATH AND CORONARY ANGIOGRAPHY;  Surgeon: Burnell Blanks, MD;  Location: [facility] LAB;  Service: Cardiovascular;  Laterality: N/A;  . LEFT HEART CATH AND CORONARY ANGIOGRAPHY N/A 02/13/2021   Procedure: LEFT HEART CATH AND CORONARY ANGIOGRAPHY;  Surgeon: Wellington Hampshire, MD;  Location: [facility];  Service: Cardiovascular;  Laterality: N/A;  . LOOP RECORDER INSERTION N/A 05/15/2020   Procedure: LOOP RECORDER INSERTION;  Surgeon: Thompson Grayer, MD;  Location: [place] [facility];  Service: Cardiovascular;  Laterality: N/A;  . stress myoview  06/29/06   sm distal anteroseptal & apical infarct  . TEE WITHOUT CARDIOVERSION N/A 05/15/2020   Procedure: TRANSESOPHAGEAL ECHOCARDIOGRAM (TEE);  Surgeon: Pixie Casino, MD;  Location: [facility] ENDOSCOPY;  Service: Cardiovascular;  Laterality: N/A;  . THYROIDECTOMY, PARTIAL  1967   B9 growth   Social History   Occupational History  . 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 Occupation: retired  Tobacco Use  . Smoking status: Former Smoker    Types: Cigars  . Smokeless tobacco: Never Used  . Tobacco comment: cigar occassionally  Vaping Use  . Vaping Use: Never used  Substance and Sexual Activity  . Alcohol use: Not Currently    Alcohol/week: 0.0 standard drinks  . Drug use: No  . Sexual activity: Not on file

## 2021-04-09 ENCOUNTER — Ambulatory Visit (HOSPITAL_COMMUNITY)
Admission: RE | Admit: 2021-04-09 | Discharge: 2021-04-09 | Disposition: A | Discharge status: Home / Self Care | Payer: Medicare Other | Source: Ambulatory Visit | Attending: Surgery | Admitting: Surgery

## 2021-04-09 ENCOUNTER — Other Ambulatory Visit: Payer: Self-pay

## 2021-04-09 DIAGNOSIS — G8929 Other chronic pain: Secondary | ICD-10-CM | POA: Diagnosis not present

## 2021-04-09 DIAGNOSIS — M545 Low back pain, unspecified: Secondary | ICD-10-CM | POA: Diagnosis not present

## 2021-04-10 ENCOUNTER — Telehealth: Payer: Self-pay

## 2021-04-10 NOTE — Chronic Care Management (AMB) (Addendum)
Chronic Care Management Pharmacy Assistant   Name: Nathan Fernandez  MRN: [id number] DOB: 1941-09-07  Reason for Encounter - Reminder call   Recent office visits:  02/21/21 Dr.Graham Damita Dunnings, PCP - Added Acetaminophen 650mg  - 1 tablet 2 times a day PRN 01/18/21  Dr.Graham Damita Dunnings, PCP -  Added Amoxicillin 875-125 mg 1 tablet 2 times daily and benzonatate 200mg  1 tablet 3 times a day PRN  Recent consult visits:  04/03/21 Benjiman Core PA, Orthopedics 03/07/21 Coletta Memos  NP, Cardiology 01/09/21 Debara Pickett PA, [facility]  - Added Benzonatate 200mg  1 tablet 3 times daily PRN and prednisone 10mg  six day taper   Hospital visits:  Admitted to the hospital on 02/11/21 due to Myocardial Infarction and Covid -19 Discharge date was 02/14/21.  Discharged from [facility].    Medications: Outpatient Encounter Medications as of 04/10/2021  Medication Sig   acetaminophen (TYLENOL) 325 MG tablet Take 2 tablets (650 mg total) by mouth 2 (two) times daily as needed.   albuterol (VENTOLIN HFA) 108 (90 Base) MCG/ACT inhaler Inhale 1-2 puffs into the lungs every 6 (six) hours as needed for shortness of breath.   Ascorbic Acid (VITAMIN C) 1000 MG tablet Take 500 mg by mouth daily.   aspirin 81 MG EC tablet Take 81 mg by mouth daily.   benzonatate (TESSALON) 200 MG capsule Take 1 capsule (200 mg total) by mouth 3 (three) times daily as needed.   buPROPion (WELLBUTRIN SR) 150 MG 12 hr tablet Take 1 tablet (150 mg total) by mouth daily.   carvedilol (COREG) 6.25 MG tablet Take 1 tablet (6.25 mg total) by mouth in the morning and at bedtime.   Cholecalciferol (VITAMIN D) 125 MCG (5000 UT) CAPS Take 5,000 Units by mouth daily.   cyclobenzaprine (FLEXERIL) 5 MG tablet Take 1 tablet (5 mg total) by mouth 3 (three) times daily as needed for muscle spasms.   dapagliflozin propanediol (FARXIGA) 5 MG TABS tablet Take 1 tablet (5 mg total) by mouth daily before breakfast.   dorzolamide-timolol  (COSOPT) 22.3-6.8 MG/ML ophthalmic solution Place 1 drop into both eyes 2 (two) times daily.   ELDERBERRY PO Take 1,250 mg by mouth daily.   fenofibrate (TRICOR) 48 MG tablet Take 1 tablet (48 mg total) by mouth daily.   hydrALAZINE (APRESOLINE) 10 MG tablet TAKE 1 TABLET (10 MG TOTAL) BY MOUTH EVERY 12 (TWELVE) HOURS.   isosorbide mononitrate (IMDUR) 30 MG 24 hr tablet TAKE 1/2 TABLET BY MOUTH EVERY DAY   nitroGLYCERIN (NITROSTAT) 0.4 MG SL tablet Place 1 tablet (0.4 mg total) under the tongue every 5 (five) minutes as needed for chest pain. Up to 3 doses   Polyethyl Glycol-Propyl Glycol (SYSTANE) 0.4-0.3 % GEL ophthalmic gel Place 1 application into the left eye at bedtime.   rosuvastatin (CRESTOR) 20 MG tablet Take 1 tablet (20 mg total) by mouth daily.   traMADol (ULTRAM) 50 MG tablet Take 1 tablet (50 mg total) by mouth every 8 (eight) hours as needed (For pain not relieved by Tylenol).   traZODone (DESYREL) 100 MG tablet TAKE 2 TABLETS (200 MG TOTAL) BY MOUTH AT BEDTIME.   XARELTO 20 MG TABS tablet TAKE 1 TABLET BY MOUTH DAILY WITH SUPPER   zinc gluconate 50 MG tablet Take 50 mg by mouth daily.   No facility-administered encounter medications on file as of 04/10/2021.    Marthann Schiller was contacted to remind him of his upcoming telephone visit with Debbora Dus  on 04/15/2021 at 8:30AM. Unable to reach and voicemail box was full.  Star Rating Drugs: Medication:  Last Fill: Day Supply Farxiga 5mg .  03/25/21  30ds Rosuvastatin 20mg .  03/13/21             30ds  Debbora Dus, CPP notified  Avel Sensor, [place] Assistant [phone]  I have reviewed the care management and care coordination activities outlined in this encounter and I am certifying that I agree with the content of this note. No further action required.  Debbora Dus, PharmD Clinical Pharmacist [facility] at [facility] [phone]

## 2021-04-15 ENCOUNTER — Other Ambulatory Visit: Payer: Self-pay

## 2021-04-15 ENCOUNTER — Ambulatory Visit (INDEPENDENT_AMBULATORY_CARE_PROVIDER_SITE_OTHER): Payer: Medicare Other

## 2021-04-15 ENCOUNTER — Telehealth: Payer: Self-pay

## 2021-04-15 DIAGNOSIS — E119 Type 2 diabetes mellitus without complications: Secondary | ICD-10-CM

## 2021-04-15 DIAGNOSIS — I10 Essential (primary) hypertension: Secondary | ICD-10-CM | POA: Diagnosis not present

## 2021-04-15 DIAGNOSIS — E7800 Pure hypercholesterolemia, unspecified: Secondary | ICD-10-CM

## 2021-04-15 NOTE — Telephone Encounter (Signed)
Patient requested refills on the following:  Albuterol inhaler - reports using once/week due to allergies and congestion  Benzonatate - cough mostly resolved, still using for residual tickle in throat   Ketoconazole 2% cream - reports he ran out recently and uses PRN for irritation   Uses [facility] -  [place],  [place] [address]   Kindly let him know to request from pharmacy in the future per policy.  Debbora Dus, PharmD Clinical Pharmacist [facility] at [facility] [phone]

## 2021-04-15 NOTE — Patient Instructions (Addendum)
Dear Marthann Schiller,  Below is a summary of the goals we discussed during our follow up appointment on April 15, 2021. Please contact me anytime with questions or concerns.   Visit Information  Patient Care Plan: CCM Pharmacy Care Plan    Problem Identified: [facility] AMB "PATIENT-SPECIFIC PROBLEM"     Long-Range Goal: [place]   Start Date: 04/15/2021  Priority: High  Note:   Current Barriers:  . Pain uncontrolled  Pharmacist Clinical Goal(s):  Marland Kitchen Patient will contact provider office for questions/concerns as evidenced notation of same in electronic health record through collaboration with PharmD and provider.   Interventions: . 1:1 collaboration with Tonia Ghent, MD regarding development and update of comprehensive plan of care as evidenced by provider attestation and co-signature . Inter-disciplinary care team collaboration (see longitudinal plan of care) . Comprehensive medication review performed; medication list updated in electronic medical record  Hypertension/CAD (BP goal <140/90) -Controlled - per home and clinic readings -Current treatment:  Carvedilol 6.25 mg - 1/2 tablet twice daily  Hydralazine 10 mg - 1 tablet twice daily   Isosorbide mononitrate 30 mg - 1/2 tablet daily -Medications previously tried: ACE/ARB allergy -Current home readings: checks occasionally, last check 130/80, usually mid 120s/70s.  -Current dietary habits: No changes, pt reports he is eating smaller portions, maintained weight loss over the past 9 months, current weight 165 lbs to 170 lbs, reports he does not use a whole lot of salt. Drinks sprite daily.  -Current exercise habits: minimal, just walking around the house. Limited by back pain -Denies chest pain -Denies hypotensive/hypertensive symptoms -Educated on BP goals and benefits of medications for prevention of heart attack, stroke and kidney damage; Limit salty six  -Recommend monitor BP at home monthly, document, and  provide log at future appointments -Recommended to continue current medication  Hyperlipidemia/CAD: (LDL goal < 100) -Controlled - LDL 23 -Current treatment: . Rosuvastatin 20 mg - 1 tablet daily  . Fenofibrate 48 mg - 1 tablet daily  . Aspirin 81 mg - 1 tablet daily  -Medications previously tried: pt confirmed off pravastatin and gemfibrozil   -Recommended to continue current medication  Diabetes (A1c goal <8%) -Controlled - A1c 7.9% (Farxiga started after last A1c) -Current medications:  Farxiga 5 mg - 1 tablet daily  -Medications previously tried: metformin (2010-2013)  -Denies any side effects with [place]. Does not have a BG meter -Current home glucose readings:  . fasting glucose: none . post prandial glucose: none -Denies hypoglycemic/hyperglycemic symptoms -Educated on A1c and blood sugar goals; Prevention and management of hypoglycemic episodes; -Counseled to check feet daily and get yearly eye exams -Recommended to continue current medication  COPD (Goal: control symptoms and prevent exacerbations) -Controlled - per patient report -Current treatment  . Albuterol - 1-2 puffs every 6 hours PRN . Benzonatate 200 mg - 1 capsule TID PRN . Azelastine - PRN -Medications previously tried: none -Pulmonary function testing: none per chart -Exacerbations requiring treatment in last 6 months: none -Frequency of rescue inhaler use: once/week - increased lately due to allergies -Pt reports cough improved but still having some congestion/post nasal drip.  -Counseled on When to use rescue inhaler -Recommended to continue current medication; Request refills - benzonatate, albuterol  Pain (Goal: Control symptoms) -Uncontrolled - reports back pain is not helped by tramadol -Current treatment  . Tramadol 50 mg - 1 tablet every 4-6 hours as needed . Tylenol Arthritis 8 hour - 2 tablets in morning and 2 at bedtime .  Flexeril 5 mg - 1 tablet up to three times a day  -Medications  previously tried: none, did not try lidocaine patch -Non-pharm: Uses heat -Appointment next Friday with ortho Apr 24, 2021 -Takes 2 Tylenol around 6 AM, this helps some. Reports tramadol does not help.  -Reports pain is all day long -Scale: first thing in the morning 9/10, pain improves some once moved around. -Recommended to continue current medication; May take an additional 1000 mg Tylenol around lunch. Consider lidocaine patch - will consult PCP for alternatives.  Embolism Splenic Artery Occurred 2021 - indefinite anticoagulant recommended GFR > 60, CBC Stable -Current treatment   Xarelto 20 mg - 1 tablet daily  -Recommended to continue current medication -Patient to provide out of pocket expense report for prescriptions to proceed with PAP   Patient Goals/Self-Care Activities . Patient will:  - collaborate with provider on medication access solutions  Follow Up Plan: Telephone follow up appointment with care management team member scheduled for: 6 months      The patient verbalized understanding of instructions, educational materials, and care plan provided today and agreed to receive a mailed copy of patient instructions, educational materials, and care plan.   Debbora Dus, PharmD Clinical Pharmacist [place] [facility] at [facility] [phone]  Basics of Medicine Management Taking your medicines correctly is an important part of managing or preventing medical problems. Make sure you know what disease or condition your medicine is treating, and how and when to take it. If you do not take your medicine correctly, it may not work well and may cause unpleasant side effects, including serious health problems. What should I do when I am taking medicines?  Read all the labels and inserts that come with your medicines. Review the information often.  Talk with your pharmacist if you get a refill and notice a change in the size, color, or shape of your medicines.  Know the  potential side effects for each medicine that you take.  Try to get all your medicines from the same pharmacy. The pharmacist will have all your information and will understand how your medicines will affect each other (interact).  Tell your health care provider about all your medicines, including over-the-counter medicines, vitamins, and herbal or dietary supplements. He or she will make sure that nothing will interact with any of your prescribed medicines.   How can I take my medicines safely?  Take medicines only as told by your health care provider. ? Do not take more of your medicine than instructed. ? Do not take anyone else's medicines. ? Do not share your medicines with others. ? Do not stop taking your medicines unless your health care provider tells you to do so. ? You may need to avoid alcohol or certain foods or liquids when taking certain medicines. Follow your health care provider's instructions.  Do not split, mash, or chew your medicines unless your health care provider tells you to do so. Tell your health care provider if you have trouble swallowing your medicines.  For liquid medicine, use the dosing container that was provided. How should I organize my medicines? Know your medicines  Know what each of your medicines looks like. This includes size, color, and shape. Tell your health care provider if you are having trouble recognizing all the medicines that you are taking.  If you cannot tell your medicines apart because they look similar, keep them in original bottles.  If you cannot read the labels on the bottles, tell  your pharmacist to put your medicines in containers with large print.  Review your medicines and your schedule with family members, a friend, or a caregiver. Use a pill organizer  Use a tool to organize your medicine schedule. Tools include a weekly pillbox, a written chart, a notebook, or a calendar.  Your tool should help you remember the following  things about each medicine: ? The name of the medicine. ? The amount (dose) to take. ? The schedule. This is the day and time the medicine should be taken. ? The appearance. This includes color, shape, size, and stamp. ? How to take your medicines. This includes instructions to take them with food, without food, with fluids, or with other medicines.  Create reminders for taking your medicines. Use sticky notes, or alarms on your watch, mobile device, or phone calendar.  You may choose to use a more advanced management system. These systems have storage, alarms, and visual and audio prompts.  Some medicines can be taken on an "as-needed" basis. These include medicines for nausea or pain. If you take an as-needed medicine, write down the name and dose, as well as the date and time that you took it.   How should I plan for travel?  Take your pillbox, medicines, and organization system with you when traveling.  Have your medicines refilled before you travel. This will ensure that you do not run out of your medicines while you are away from home.  Always carry an updated list of your medicines with you. If there is an emergency, a first responder can quickly see what medicines you are taking.  Do not pack your medicines in checked luggage in case your luggage is lost or delayed.  If any of your medicines is considered a controlled substance, make sure you bring a letter from your health care provider with you. How should I store and discard my medicines? For safe storage:  Store medicines in a cool, dry area away from light, or as directed by your health care provider. Do not store medicines in the bathroom. Heat and humidity will affect them.  Do not store your medicines with other chemicals, or with medicines for pets or other household members.  Keep medicines away from children and pets. Do not leave them on counters or bedside tables. Store them in high cabinets or on high shelves. For  safe disposal:  Check expiration dates regularly. Do not take expired medicines. Discard medicines that are older than the expiration date.  Learn a safe way to dispose of your medicines. You may: ? Use a local government, hospital, or pharmacy medicine-take-back program. ? Mix the medicines with inedible substances, put them in a sealed bag or empty container, and throw them in the trash. What should I remember?  Tell your health care provider if you: ? Experience side effects. ? Have new symptoms. ? Have other concerns about taking your medicines.  Review your medicines regularly with your health care provider. Other medicines, diet, medical conditions, weight changes, and daily habits can all affect how medicines work. Ask if you need to continue taking each medicine, and discuss how well each one is working.  Refill your medicines early to avoid running out of them.  In case of an accidental overdose, call your local [place] at [phone] or visit your local emergency department immediately. This is important. Summary  Taking your medicines correctly is an important part of managing or preventing medical problems.  You need to  make sure that you understand what you are taking a medicine for, as well as how and when you need to take it.  Know your medicines and use a pill organizer to help you take your medicines correctly.  In case of an accidental overdose, call your local [place] at [phone] or visit your local emergency department immediately. This is important. This information is not intended to replace advice given to you by your health care provider. Make sure you discuss any questions you have with your health care provider. Document Revised: 12/03/2017 Document Reviewed: 12/03/2017 Elsevier Patient Education  2021 [employer].

## 2021-04-15 NOTE — Progress Notes (Addendum)
Chronic Care Management Pharmacy Note  04/15/2021 Name:  Nathan Fernandez MRN:  [id number] DOB:  [birth year]  Subjective: Nathan Fernandez is an 80 y.o. year old male who is a primary patient of Damita Dunnings, Elveria Rising, MD.  The CCM team was consulted for assistance with disease management and care coordination needs.    Engaged with patient by telephone for follow up visit in response to provider referral for pharmacy case management and/or care coordination services.   Consent to Services:  The patient was given information about Chronic Care Management services, agreed to services, and gave verbal consent prior to initiation of services.  Please see initial visit note for detailed documentation.   Patient Care Team: Tonia Ghent, MD as PCP - General (Family Medicine) Josue Hector, MD as PCP - Cardiology (Cardiology) Josue Hector, MD as Consulting Physician (Cardiology) Debbora Dus, [facility] as Pharmacist (Pharmacist)  Office Visits:  02/21/21: PCP - NSTEMI, with plan for medical management at the catheterization.  No chest pain.  Not short of breath.  Still on carvedilol, fenofibrate, hydralazine, isosorbide, Crestor.  Already anticoagulated with Xarelto.  Reasonable to continue those medications for now.  Start tramadol for pain. 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 Continue aspirin, carvedilol, isosorbide, pravastatin.  05/02/20: Cardiology - EF 35-40 % 03/2020, Start hydralazine 10 mg  BID.  Hospital visits:  02/11/21-02/14/21 - NSTEMI, left heart cath 02/13/21, aspirin added to Xarelto regimen, started Tricor  01/09/21 - Lightheadedness, hemoptyosis  Objective:  Lab Results  Component Value Date   CREATININE 0.84 02/21/2021   BUN 12 02/21/2021   GFR 82.40 02/21/2021   GFRNONAA >60 02/14/2021   GFRAA 79 01/09/2021   NA 139 02/21/2021   K 4.2 02/21/2021   CALCIUM 8.8 02/21/2021   CO2 30 02/21/2021   GLUCOSE 150 (H) 02/21/2021    Lab Results  Component Value Date/Time   HGBA1C 7.9 (H) 02/12/2021 06:22 AM   HGBA1C 7.3 (H) 08/31/2020 09:25 AM   GFR 82.40 02/21/2021 04:42 PM   GFR 74.47 08/31/2020 09:25 AM   MICROALBUR 4.7 (H) 11/25/2010 09:12 AM   MICROALBUR 2.9 (H) 01/31/2008 10:39 AM    Last diabetic Eye exam: none per chart  Last diabetic Foot exam: 09/17/20     Lab Results  Component Value Date   CHOL 87 02/13/2021   HDL 33 (L) 02/13/2021   LDLCALC 29 02/13/2021   LDLDIRECT 64.0 08/31/2020   TRIG 126 02/13/2021   CHOLHDL 2.6 02/13/2021    Hepatic Function Latest Ref Rng & Units 02/14/2021 02/13/2021 02/11/2021  Total Protein 6.5 - 8.1 g/dL 5.7(L) 5.5(L) 6.7  Albumin 3.5 - 5.0 g/dL 2.9(L) 2.8(L) 3.5  AST 15 - 41 U/L _0 ALT 0 - 44 U/L _1 Alk Phosphatase 38 - 126 U/L 47 41 53  Total Bilirubin 0.3 - 1.2 mg/dL 0.4 0.3 0.3  Bilirubin, Direct 0.0 - 0.3 mg/dL - - -    Lab Results  Component Value Date/Time   TSH 3.054 02/12/2021 06:22 AM   TSH 2.39 08/31/2018 11:56 AM   TSH 1.90 02/24/2013 08:26 AM    CBC Latest Ref Rng & Units 02/21/2021 02/14/2021 02/13/2021  WBC 4.0 - 10.5 K/uL 7.2 7.6 7.3  Hemoglobin 13.0 - 17.0 g/dL 14.1 14.5 13.8  Hematocrit 39.0 - 52.0 % 41.3 43.0 40.8  Platelets 150.0 - 400.0 K/uL 207.0 182 178    Lab Results  Component Value Date/Time   VD25OH 49 11/25/2010 09:38 PM    Clinical ASCVD: Yes  The ASCVD Risk score Mikey Bussing DC Jr., et al., 2013) failed to calculate for the following reasons:   The 2013 ASCVD  risk score is only valid for ages 70 to 39   The patient has a prior MI or stroke diagnosis    Depression screen [facility] 2/9 08/31/2020 08/30/2019 08/24/2018  Decreased Interest 0 0 0  Down, Depressed, Hopeless 0 0 0  PHQ - 2 Score 0 0 0  Altered sleeping 0 0 0  Tired, decreased energy 0 0 0  Change in appetite 0 0 0  Feeling bad or failure about yourself  0 0 0  Trouble concentrating 0 0 0  Moving slowly or fidgety/restless 0 0 0  Suicidal thoughts 0 0 0  PHQ-9 Score 0 0 0  Difficult doing work/chores Not difficult at all - Not difficult at all    Social History   Tobacco Use  Smoking Status Former Smoker  . Types: Cigars  Smokeless Tobacco Never Used  Tobacco Comment   cigar occassionally   BP Readings from Last 3 Encounters:  04/03/21 126/75  03/07/21 110/64  02/21/21 102/66   Pulse Readings from Last 3 Encounters:  04/03/21 60  03/07/21 72  02/21/21 67   Wt Readings from Last 3 Encounters:  04/03/21 171 lb 3.2 oz (77.7 kg)  03/07/21 171 lb 3.2 oz (77.7 kg)  02/21/21 167 lb (75.8 kg)   BMI Readings from Last 3 Encounters:  04/03/21 25.28 kg/m  03/07/21 25.28 kg/m  02/21/21 24.66 kg/m   Echocardiogram 02/12/2021 - LVEF of 45-50%  Assessment/Interventions: Review of patient past medical history, allergies, medications, health status, including review of consultants reports, laboratory and other test data, was performed as part of comprehensive evaluation and provision of chronic care management services.   SDOH:  (Social Determinants of Health) assessments and interventions performed: Yes   SDOH Interventions   Flowsheet Row Most Recent Value  SDOH Interventions   Financial Strain Interventions Other (Comment)  [Working on Xarelto assistance program application]     SDOH Screenings   Alcohol Screen: Low Risk   . Last Alcohol Screening Score (AUDIT): 0  Depression (PHQ2-9): Low Risk   . PHQ-2 Score: 0  Financial Resource Strain: High Risk  . Difficulty of  Paying Living Expenses: Hard  Food Insecurity: No Food Insecurity  . Worried About Charity fundraiser in the Last Year: Never true  . Ran Out of Food in the Last Year: Never true  Housing: Low Risk   . Last Housing Risk Score: 0  Physical Activity: Inactive  . Days of Exercise per Week: 0 days  . Minutes of Exercise per Session: 0 min  Social Connections: Not on file  Stress: No Stress Concern Present  . Feeling of Stress : Not at all  Tobacco Use: Medium Risk  . Smoking Tobacco Use: Former Smoker  . Smokeless Tobacco Use: Never Used  Transportation Needs: No Transportation Needs  .  Lack of Transportation (Medical): No  . Lack of Transportation (Non-Medical): No    CCM Care Plan  Allergies  Allergen Reactions  . Ace Inhibitors Swelling    Swelling of the tongue  . Angiotensin Receptor Blockers Swelling    Tongue swelling  . Enalapril Swelling    Tongue swelling    Medications Reviewed Today    Reviewed by Debbora Dus, [facility] (Pharmacist) on 04/15/21 at 1000  Med List Status: <None>  Medication Order Taking? Sig Documenting Provider Last Dose Status Informant  acetaminophen (TYLENOL) 325 MG tablet [id number] Yes Take 2 tablets (650 mg total) by mouth 2 (two) times daily as needed. Tonia Ghent, MD Taking Active   albuterol (VENTOLIN HFA) 108 [phone] Base) MCG/ACT inhaler [id number] Yes Inhale 1-2 puffs into the lungs every 6 (six) hours as needed for shortness of breath. Tonia Ghent, MD Taking Active Spouse/Significant Other  Ascorbic Acid (VITAMIN C) 1000 MG tablet [id number]  Take 500 mg by mouth daily. [provider]  Active Spouse/Significant Other  aspirin 81 MG EC tablet [id number] Yes Take 81 mg by mouth daily. [provider] Taking Active Spouse/Significant Other  benzonatate (TESSALON) 200 MG capsule [id number] Yes Take 1 capsule (200 mg total) by mouth 3 (three) times daily as needed. Tonia Ghent, MD Taking Active Spouse/Significant Other   buPROPion [facility] SR) 150 MG 12 hr tablet [id number]  Take 1 tablet (150 mg total) by mouth daily. Tonia Ghent, MD  Active Spouse/Significant Other  carvedilol (COREG) 6.25 MG tablet [id number]  Take 1 tablet (6.25 mg total) by mouth in the morning and at bedtime. Barrett, Evelene Croon, PA-C  Active   Cholecalciferol (VITAMIN D) 125 MCG (5000 UT) CAPS [id number]  Take 5,000 Units by mouth daily. [provider]  Active Spouse/Significant Other  cyclobenzaprine (FLEXERIL) 5 MG tablet [id number] Yes Take 1 tablet (5 mg total) by mouth 3 (three) times daily as needed for muscle spasms. Tonia Ghent, MD Taking Active   dapagliflozin propanediol (FARXIGA) 5 MG TABS tablet [id number] Yes Take 1 tablet (5 mg total) by mouth daily before breakfast. Tonia Ghent, MD Taking Active   dorzolamide-timolol (COSOPT) 22.3-6.8 MG/ML ophthalmic solution [id number]  Place 1 drop into both eyes 2 (two) times daily. [provider]  Active Spouse/Significant Other  ELDERBERRY PO [id number]  Take 1,250 mg by mouth daily. [provider]  Active Spouse/Significant Other  fenofibrate (TRICOR) 48 MG tablet [id number]  Take 1 tablet (48 mg total) by mouth daily. Barrett, Evelene Croon, PA-C  Active   hydrALAZINE (APRESOLINE) 10 MG tablet [id number]  TAKE 1 TABLET (10 MG TOTAL) BY MOUTH EVERY 12 (TWELVE) HOURS. Richardson Dopp T, PA-C  Active   isosorbide mononitrate (IMDUR) 30 MG 24 hr tablet [id number]  TAKE 1/2 TABLET BY MOUTH EVERY DAY Josue Hector, MD  Active   nitroGLYCERIN (NITROSTAT) 0.4 MG SL tablet [id number]  Place 1 tablet (0.4 mg total) under the tongue every 5 (five) minutes as needed for chest pain. Up to 3 doses Tonia Ghent, MD  Active   Polyethyl Glycol-Propyl Glycol (SYSTANE) 0.4-0.3 % GEL ophthalmic gel [id number]  Place 1 application into the left eye at bedtime. [provider]  Active   rosuvastatin (CRESTOR) 20 MG tablet [id number]  Take 1 tablet (20 mg total) by mouth  daily. Tonia Ghent, MD  Active Spouse/Significant Other  traMADol Veatrice Bourbon) 50 MG tablet [id number] Yes Take 1 tablet (50 mg total) by mouth every 8 (eight)  hours as needed (For pain not relieved by Tylenol). Tonia Ghent, MD Taking Active   traZODone (DESYREL) 100 MG tablet [id number]  TAKE 2 TABLETS (200 MG TOTAL) BY MOUTH AT BEDTIME. Tonia Ghent, MD  Active Spouse/Significant Other  XARELTO 20 MG TABS tablet [id number]  TAKE 1 TABLET BY MOUTH DAILY WITH SUPPER Tonia Ghent, MD  Active   zinc gluconate 50 MG tablet [id number]  Take 50 mg by mouth daily. [provider]  Active Spouse/Significant Other          Patient Active Problem List   Diagnosis Date Noted  . Non-ST elevation MI (NSTEMI) ([place]) 02/12/2021  . NSTEMI (non-ST elevated myocardial infarction) ([place]) 02/12/2021  . COVID-19 virus infection 02/12/2021  . Long term (current) use of anticoagulants 05/27/2020  . 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 Hemoptysis 07/10/2019  . Abnormal cardiovascular stress test   . Tremor 09/02/2018  . Advance care planning 09/02/2018  . Cold intolerance 09/02/2018  . Dry mouth 03/12/2018  . Shoulder pain 01/05/2018  . Hypoxia 12/29/2017  . Wrist pain 12/29/2017  . Lower back pain 02/18/2017  . Lower urinary tract symptoms (LUTS) 02/11/2016  . Bilateral knee pain 05/25/2015  . Benign paroxysmal positional vertigo 02/18/2015  . Abdominal wall symptom 02/18/2015  . Gout 05/04/2014  . Healthcare maintenance 04/18/2014  . COPD (chronic obstructive pulmonary disease) ([place]) 03/25/2013  . CAD (coronary artery disease) 03/03/2013  . Other malaise and fatigue 02/22/2013  . Chest wall pain 05/31/2012  . Heartburn 05/07/2011  . Depression 11/28/2010  . 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 Diabetes mellitus ([place]) 03/18/2010  . 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  HYPERCHOLESTEROLEMIA 10/19/2007  . 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 HYPERTENSION, BENIGN 10/19/2007  . ROSACEA 10/19/2007  . Osteoarthritis 10/19/2007  . Insomnia 10/19/2007    Immunization History  Administered Date(s) Administered  . Influenza Split 01/07/2012  . Influenza,inj,Quad PF,6+ Mos 02/08/2016  . PFIZER(Purple Top)SARS-COV-2 Vaccination 02/13/2020, 03/05/2020  . Pneumococcal Conjugate-13 02/08/2016  . Pneumococcal Polysaccharide-23 09/07/2020  . Td 09/01/2001    Conditions to be addressed/monitored:  Hypertension, Hyperlipidemia and Diabetes  Care Plan : [place]  Updates made by Debbora Dus, [facility] since 04/15/2021 12:00 AM    Problem: CHL AMB "PATIENT-SPECIFIC PROBLEM"     Long-Range Goal: [place]   Start Date: 04/15/2021  Priority: High  Note:   Current Barriers:  . Pain uncontrolled  Pharmacist Clinical Goal(s):  Marland Kitchen Patient will contact provider office for questions/concerns as evidenced notation of same in electronic health record through collaboration with PharmD and provider.   Interventions: . 1:1 collaboration with Tonia Ghent, MD regarding development and update of comprehensive plan of care as evidenced by provider attestation and co-signature . Inter-disciplinary care team collaboration (see longitudinal plan of care) . Comprehensive medication review performed; medication list updated in electronic medical record  Hypertension/CAD (BP goal <140/90) -Controlled - per home and clinic readings -Current treatment:  Carvedilol 6.25 mg - 1/2 tablet twice daily  Hydralazine 10 mg - 1 tablet twice daily   Isosorbide mononitrate 30 mg - 1/2 tablet daily -Medications previously tried: ACE/ARB allergy -Current home readings: checks occasionally, last check 130/80, usually mid 120s/70s.  -Current dietary habits: No changes, pt reports he is eating smaller portions, maintained weight loss over the past 9 months, current  weight 165 lbs to 170 lbs, reports he does  not use a whole lot of salt. Drinks sprite daily.  -Current exercise habits: minimal, just walking around the house. Limited by back pain -Denies chest pain -Denies hypotensive/hypertensive symptoms -Educated on BP goals and benefits of medications for prevention of heart attack, stroke and kidney damage; Limit salty six  -Recommend monitor BP at home monthly, document, and provide log at future appointments -Recommended to continue current medication  Hyperlipidemia/CAD: (LDL goal < 100) -Controlled - LDL 23 -Current treatment: . Rosuvastatin 20 mg - 1 tablet daily  . Fenofibrate 48 mg - 1 tablet daily  . Aspirin 81 mg - 1 tablet daily  -Medications previously tried: pt confirmed off pravastatin and gemfibrozil   -Recommended to continue current medication  Diabetes (A1c goal <8%) -Controlled - A1c 7.9% (Farxiga started after last A1c) -Current medications:  Farxiga 5 mg - 1 tablet daily  -Medications previously tried: metformin (2010-2013)  -Denies any side effects with [place]. Does not have a BG meter -Current home glucose readings:  . fasting glucose: none . post prandial glucose: none -Denies hypoglycemic/hyperglycemic symptoms -Educated on A1c and blood sugar goals; Prevention and management of hypoglycemic episodes; -Counseled to check feet daily and get yearly eye exams -Recommended to continue current medication  COPD (Goal: control symptoms and prevent exacerbations) -Controlled - per patient report -Current treatment  . Albuterol - 1-2 puffs every 6 hours PRN . Benzonatate 200 mg - 1 capsule TID PRN . Azelastine - PRN -Medications previously tried: none -Pulmonary function testing: none per chart -Exacerbations requiring treatment in last 6 months: none -Frequency of rescue inhaler use: once/week - increased lately due to allergies -Pt reports cough improved but still having some congestion/post nasal drip.  -Counseled  on When to use rescue inhaler -Recommended to continue current medication; Request refills - benzonatate, albuterol  Pain (Goal: Control symptoms) -Uncontrolled - reports back pain is not helped by tramadol -Current treatment  . Tramadol 50 mg - 1 tablet every 4-6 hours as needed . Tylenol Arthritis 8 hour - 2 tablets in morning and 2 at bedtime . Flexeril 5 mg - 1 tablet up to three times a day  -Medications previously tried: none, did not try lidocaine patch -Non-pharm: Uses heat -Appointment next Friday with ortho Apr 24, 2021 -Takes 2 Tylenol around 6 AM, this helps some. Reports tramadol does not help.  -Reports pain is all day long -Scale: first thing in the morning 9/10, pain improves some once moved around. -Recommended to continue current medication; May take an additional 1000 mg Tylenol around lunch. Consider lidocaine patch - will consult PCP for alternatives.  Embolism Splenic Artery Occurred 2021 - indefinite anticoagulant recommended GFR > 60, CBC Stable -Current treatment   Xarelto 20 mg - 1 tablet daily  -Recommended to continue current medication -Patient to provide out of pocket expense report for prescriptions to proceed with PAP   Patient Goals/Self-Care Activities . Patient will:  - collaborate with provider on medication access solutions  Follow Up Plan: Telephone follow up appointment with care management team member scheduled for: 6 months     Medication Assistance: Application for Xarelto  medication assistance program. in process.  Anticipated assistance start date TBD - pending patient reaching 4% out of pocket requirement.  See plan of care for additional detail.  Star Rating Drugs: Medication:                Last Fill:         Day Supply Farxiga 3m.  03/25/21              30ds Rosuvastatin 35m.   03/13/21  30ds  Patient's [id number] [id number]: [phone]: [phone] pill box? Yes - wife helps fill box Pt endorses 100% compliance  We discussed: Benefits of medication synchronization, packaging and delivery as well as enhanced pharmacist oversight with [employer]. Patient decided to: Continue current medication management strategy  Care Plan and Follow Up Patient Decision:  Patient agrees to Care Plan and Follow-up.  MiDebbora DusPharmD Clinical Pharmacist [phone] details: CCM Time Spent      Value Time User   Time spent with patient (minutes)  75 04/15/2021  1:34 PM AdDebbora DusRPWestern [facility] Center Time spent performing Chart review  30 04/15/2021  1:34 PM AdDebbora DusRPSaint [facility] Total time (minutes)  105 04/15/2021  1:34 PM AdDebbora DusRPH     Moderate to High Complex Decision Making      Value Time User   Moderate to High complex decision making  Yes 04/15/2021  1:34 PM AdDebbora DusRPRay [facility]   CCM Services: This encounter meets complex CCM services and moderate to high decision making.  Prior to outreach and patient consent for Chronic Care Management, I referred this patient for services after reviewing the nominated patient list or from a personal encounter with the patient.  I have personally reviewed this encounter including the documentation in this note and have collaborated with the care management provider regarding care management and care coordination activities to include development and update of the comprehensive care plan. I am certifying that I agree with the content of this note and encounter as supervising physician.

## 2021-04-15 NOTE — Telephone Encounter (Signed)
Patient reports back pain is 9/10 and unable to see ortho again until 04/24/21.  He is taking tylenol 1000 mg BID and Flexeril 5 mg TID. Tramadol ineffective, not taking. I told him he could take an additional 1000 mg Tylenol mid-day. If unresolved, try lidocaine patch 4% over the counter. Apply to back once daily and may leave on for 12 hours. Please let me know if you have any other recommendations.  Debbora Dus, PharmD Clinical Pharmacist [facility] [phone]

## 2021-04-16 MED ORDER — GABAPENTIN 100 MG PO CAPS: 100.0000 mg | ORAL_CAPSULE | Freq: Three times a day (TID) | ORAL | 1 refills | Status: DC | PRN

## 2021-04-16 MED ORDER — ALBUTEROL SULFATE HFA 108 (90 BASE) MCG/ACT IN AERS
1.0000 | INHALATION_SPRAY | Freq: Four times a day (QID) | RESPIRATORY_TRACT | 5 refills | Status: DC | PRN
Start: 2021-04-16 — End: 2021-06-22

## 2021-04-16 MED ORDER — BENZONATATE 200 MG PO CAPS: 200.0000 mg | ORAL_CAPSULE | Freq: Three times a day (TID) | ORAL | 2 refills | Status: DC | PRN

## 2021-04-16 MED ORDER — KETOCONAZOLE 2 % EX CREA: 1.0000 "application " | TOPICAL_CREAM | Freq: Every day | CUTANEOUS | 5 refills | Status: DC | PRN

## 2021-04-16 NOTE — Telephone Encounter (Signed)
Sent. Thanks.   

## 2021-04-16 NOTE — Telephone Encounter (Signed)
Spoke with patients wife (okay per DPR) about MRI report. Notified wife about gabapentin rx to try and advised on directions and sedation caution. Advised message has also been sent to ortho for input and advised if theres any updates or changes from ortho that we would call back but if not to just follow up with them at the appt on 04/24/21.

## 2021-04-16 NOTE — Telephone Encounter (Signed)
Please call pt.    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  If no help with lidocaine and tylenol, would try gabapentin 100mg  1-2 tabs up to 3 times a day if needed for pain.  Sedation caution.  Would start on 1 tab at a time and gradually inc if needed/tolerated.  rx sent.  I need ortho clinic input.  I routed this message ortho in the meantime.  I thank all involved.

## 2021-04-22 ENCOUNTER — Ambulatory Visit (INDEPENDENT_AMBULATORY_CARE_PROVIDER_SITE_OTHER): Payer: Medicare Other

## 2021-04-22 DIAGNOSIS — I639 Cerebral infarction, unspecified: Secondary | ICD-10-CM

## 2021-04-23 LAB — CUP PACEART REMOTE DEVICE CHECK
Date Time Interrogation Session: 20220430230059
Implantable Pulse Generator Implant Date: 20210525

## 2021-04-24 ENCOUNTER — Ambulatory Visit: Payer: Medicare Other | Admitting: Specialist

## 2021-04-24 ENCOUNTER — Encounter: Payer: Self-pay | Admitting: Specialist

## 2021-04-24 VITALS — BP 137/84 | HR 69 | Ht 69.0 in | Wt 171.2 lb

## 2021-04-24 DIAGNOSIS — M4726 Other spondylosis with radiculopathy, lumbar region: Secondary | ICD-10-CM

## 2021-04-24 DIAGNOSIS — M48062 Spinal stenosis, lumbar region with neurogenic claudication: Secondary | ICD-10-CM

## 2021-04-24 DIAGNOSIS — M5416 Radiculopathy, lumbar region: Secondary | ICD-10-CM | POA: Diagnosis not present

## 2021-04-24 NOTE — Progress Notes (Signed)
Office Visit Note   Patient: Nathan Fernandez           Date of Birth: [birth year]           MRN: [id number] Visit Date: 04/24/2021              Requested by: Tonia Ghent, MD [address] [place],  [place] [address] PCP: Tonia Ghent, MD   Assessment & Plan: Visit Diagnoses:  1. Spinal stenosis of lumbar region with neurogenic claudication   2. Other spondylosis with radiculopathy, lumbar region   3. Lumbar radiculopathy     Plan: Avoid bending, stooping and avoid lifting weights greater than 10 lbs. Avoid prolong standing and walking. Avoid frequent bending and stooping  No lifting greater than 10 lbs. May use ice or moist heat for pain. Weight loss is of benefit. Handicap license is approved. Start Physical therapy program  If the pain worsens then we would contact.Dr. Sharmon Revere office at [facility] will call to arrange for epidural steroid injection   Follow-Up Instructions: Return in about 4 weeks (around 05/22/2021).   Orders:  No orders of the defined types were placed in this encounter.  No orders of the defined types were placed in this encounter.     Procedures: No procedures performed   Clinical Data: No additional findings.   Subjective: Chief Complaint  Patient presents with  . Lower Back - Follow-up    MRI Review of the Lumbar spine    21 yeaar old male with history of onset of left low back pain and radiation into the left lateral thigh. 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 Pain is sharp and improved with the use of gabapentin. 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 No bowel or bladder incontinence but has urgency and feeling like he is not emptying. No been walking much with onset of this 1.5 years ago and it has gotten gradually worse.    Review of Systems  Constitutional: Negative.   HENT: Positive for congestion, rhinorrhea, sinus pressure and sinus pain. Negative for facial swelling.   Eyes:  Negative.   Respiratory: Negative.   Cardiovascular: Negative.   Gastrointestinal: Negative.   Endocrine: Negative.   Genitourinary: Negative.   Musculoskeletal: Positive for back pain and gait problem.  Skin: Negative.   Allergic/Immunologic: Negative.   Neurological: Positive for weakness and numbness.  Hematological: Negative.   Psychiatric/Behavioral: Negative.      Objective: Vital Signs: BP 137/84 (BP Location: Left Arm, Patient Position: Sitting)   Pulse 69   Ht 5\' 9"  (1.753 m)   Wt 171 lb 3.2 oz (77.7 kg)   BMI 25.28 kg/m   Physical Exam Constitutional:      Appearance: He is well-developed.  HENT:     Head: Normocephalic and atraumatic.  Eyes:     Pupils: Pupils are equal, round, and reactive to light.  Pulmonary:     Effort: Pulmonary effort is normal.     Breath sounds: Normal breath sounds.  Abdominal:     General: Bowel sounds are normal.     Palpations: Abdomen is soft.  Musculoskeletal:        General: Normal range of motion.     Cervical back: Normal range of motion and neck supple.  Skin:    General: Skin is warm and dry.  Neurological:     Mental Status: He is alert and oriented to person, place, and time.  Psychiatric:  Behavior: Behavior normal.        Thought Content: Thought content normal.        Judgment: Judgment normal.     Ortho Exam  Specialty Comments:  No specialty comments available.  Imaging: No results found.   PMFS History: Patient Active Problem List   Diagnosis Date Noted  . Non-ST elevation MI (NSTEMI) ([place]) 02/12/2021  . NSTEMI (non-ST elevated myocardial infarction) ([place]) 02/12/2021  . COVID-19 virus infection 02/12/2021  . Long term (current) use of anticoagulants 05/27/2020  . 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 Hemoptysis 07/10/2019  . Abnormal cardiovascular stress test   . Tremor 09/02/2018  . Advance care planning 09/02/2018  . Cold intolerance 09/02/2018   . Dry mouth 03/12/2018  . Shoulder pain 01/05/2018  . Hypoxia 12/29/2017  . Wrist pain 12/29/2017  . Lower back pain 02/18/2017  . Lower urinary tract symptoms (LUTS) 02/11/2016  . Bilateral knee pain 05/25/2015  . Benign paroxysmal positional vertigo 02/18/2015  . Abdominal wall symptom 02/18/2015  . Gout 05/04/2014  . Healthcare maintenance 04/18/2014  . COPD (chronic obstructive pulmonary disease) ([place]) 03/25/2013  . CAD (coronary artery disease) 03/03/2013  . Other malaise and fatigue 02/22/2013  . Chest wall pain 05/31/2012  . Heartburn 05/07/2011  . Depression 11/28/2010  . 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 Diabetes mellitus ([place]) 03/18/2010  . 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 HYPERCHOLESTEROLEMIA 10/19/2007  . 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 HYPERTENSION, BENIGN 10/19/2007  . ROSACEA 10/19/2007  . Osteoarthritis 10/19/2007  . Insomnia 10/19/2007   Past Medical History:  Diagnosis Date  . 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   Hosp R facial weakness  . CAD (coronary artery disease)    MI, [place], PTCA; [place] 04/18/13: Distal left main 40%, proximal LAD with several aneurysmal segments, proximal LAD 50% prior to and after aneurysmal segments, area does not appear to flow-limiting, proximal diagonal 70%, ostial circumflex 20%, proximal circumflex 20%, OM1 30-40%, proximal RCA 40%, mid RCA 30%, distal RCA 20%, EF 50% => med Rx  . COPD (chronic obstructive pulmonary disease) ([place])   . DM2 (diabetes mellitus, type 2) ([place])   . History of ETT 1998   wnl  . History of MRI 08/04/09   brain- atrophy sm vess dz  . HLD (hyperlipidemia)   . HTN (hypertension)   . OA (osteoarthritis)     Family History  Problem Relation Age of Onset  . Hypertension Mother   . Aneurysm Mother   . Stroke Mother   . Hypertension Father   . Heart disease Father        CAD  . Heart disease Son        MI  . Colon cancer Neg Hx   .  Prostate cancer Neg Hx     Past Surgical History:  Procedure Laterality Date  . BUBBLE STUDY  05/15/2020   Procedure: BUBBLE STUDY;  Surgeon: Pixie Casino, MD;  Location: [facility] ENDOSCOPY;  Service: Cardiovascular;;  . cataract surgery  09/06   repair lens which moved  . EYE SURGERY    . LEFT HEART CATH AND CORONARY ANGIOGRAPHY N/A 01/21/2019   Procedure: LEFT HEART CATH AND CORONARY ANGIOGRAPHY;  Surgeon: Burnell Blanks, MD;  Location: [place] CV LAB;  Service: Cardiovascular;  Laterality: N/A;  . LEFT HEART CATH AND CORONARY ANGIOGRAPHY N/A 02/13/2021   Procedure: LEFT HEART CATH AND CORONARY ANGIOGRAPHY;  Surgeon: Fletcher Anon,  Mertie Clause, MD;  Location: [place] CV LAB;  Service: Cardiovascular;  Laterality: N/A;  . LOOP RECORDER INSERTION N/A 05/15/2020   Procedure: LOOP RECORDER INSERTION;  Surgeon: Thompson Grayer, MD;  Location: [place] CV LAB;  Service: Cardiovascular;  Laterality: N/A;  . stress myoview  06/29/06   sm distal anteroseptal & apical infarct  . TEE WITHOUT CARDIOVERSION N/A 05/15/2020   Procedure: TRANSESOPHAGEAL ECHOCARDIOGRAM (TEE);  Surgeon: Pixie Casino, MD;  Location: [facility] ENDOSCOPY;  Service: Cardiovascular;  Laterality: N/A;  . THYROIDECTOMY, PARTIAL  1967   B9 growth   Social History   Occupational History  . 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 Occupation: retired  Tobacco Use  . Smoking status: Former Smoker    Types: Cigars  . Smokeless tobacco: Never Used  . Tobacco comment: cigar occassionally  Vaping Use  . Vaping Use: Never used  Substance and Sexual Activity  . Alcohol use: Not Currently    Alcohol/week: 0.0 standard drinks  . Drug use: No  . Sexual activity: Not on file

## 2021-04-24 NOTE — Patient Instructions (Addendum)
Avoid bending, stooping and avoid lifting weights greater than 10 lbs. Avoid prolong standing and walking. Avoid frequent bending and stooping  No lifting greater than 10 lbs. May use ice or moist heat for pain. Weight loss is of benefit. Handicap license is approved. Start Physical therapy program  If the pain worsens then call us and ask for [place] we would contact.Dr. Sharmon Revere office at [facility] will call to arrange for epidural steroid injection

## 2021-05-07 NOTE — Progress Notes (Signed)
Cardiology Clinic Note   Patient Name: Nathan Fernandez Date of Encounter: 05/13/2021  Primary Care Provider:  Tonia Ghent, MD Primary Cardiologist:  Jenkins Rouge, MD  Patient Profile    79 y.o. f/u for CAD   Past Medical History    Past Medical History:  Diagnosis Date  . 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   Hosp R facial weakness  . CAD (coronary artery disease)    MI, [place], PTCA; [place] 04/18/13: Distal left main 40%, proximal LAD with several aneurysmal segments, proximal LAD 50% prior to and after aneurysmal segments, area does not appear to flow-limiting, proximal diagonal 70%, ostial circumflex 20%, proximal circumflex 20%, OM1 30-40%, proximal RCA 40%, mid RCA 30%, distal RCA 20%, EF 50% => med Rx  . COPD (chronic obstructive pulmonary disease) ([place])   . DM2 (diabetes mellitus, type 2) ([place])   . History of ETT 1998   wnl  . History of MRI 08/04/09   brain- atrophy sm vess dz  . HLD (hyperlipidemia)   . HTN (hypertension)   . OA (osteoarthritis)    Past Surgical History:  Procedure Laterality Date  . BUBBLE STUDY  05/15/2020   Procedure: BUBBLE STUDY;  Surgeon: Pixie Casino, MD;  Location[facility] ENDOSCOPY;  Service: Cardiovascular;;  . cataract surgery  09/06   repair lens which moved  . EYE SURGERY    . LEFT HEART CATH AND CORONARY ANGIOGRAPHY N/A 01/21/2019   Procedure: LEFT HEART CATH AND CORONARY ANGIOGRAPHY;  Surgeon: Burnell Blanks, MD;  Location: [place] CV LAB;  Service: Cardiovascular;  Laterality: N/A;  . LEFT HEART CATH AND CORONARY ANGIOGRAPHY N/A 02/13/2021   Procedure: LEFT HEART CATH AND CORONARY ANGIOGRAPHY;  Surgeon: Wellington Hampshire, MD;  Location: [place] CV LAB;  Service: Cardiovascular;  Laterality: N/A;  . LOOP RECORDER INSERTION N/A 05/15/2020   Procedure: LOOP RECORDER INSERTION;  Surgeon: Thompson Grayer, MD;  Location: [place] CV LAB;  Service: Cardiovascular;  Laterality: N/A;  . stress myoview  06/29/06   sm distal  anteroseptal & apical infarct  . TEE WITHOUT CARDIOVERSION N/A 05/15/2020   Procedure: TRANSESOPHAGEAL ECHOCARDIOGRAM (TEE);  Surgeon: Pixie Casino, MD;  Location: [facility] ENDOSCOPY;  Service: Cardiovascular;  Laterality: N/A;  . THYROIDECTOMY, PARTIAL  1967   B9 growth    Allergies  Allergies  Allergen Reactions  . Ace Inhibitors Swelling    Swelling of the tongue  . Angiotensin Receptor Blockers Swelling    Tongue swelling  . Enalapril Swelling    Tongue swelling    History of Present Illness    80 y.o. 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 Medical Rx recommended LAD not suitable for PCI TTE 02/12/21 showed EF 45-50% He has been compliant with meds with no recurrent angina   Primary issue is back pain Unable to golf any more Saw Dr Louanne Skye but not happy with him and want 2 nd opinion Needs new nitro No angina Compliant with meds    Home Medications    Prior to Admission medications   Medication Sig Start Date End Date Taking? Authorizing Provider  acetaminophen (TYLENOL) 325 MG tablet Take 2 tablets (650 mg total) by mouth 2 (two) times daily as needed. 02/21/21   Tonia Ghent, MD  albuterol (VENTOLIN HFA) 108 (90 Base) MCG/ACT inhaler Inhale  1-2 puffs into the lungs every 6 (six) hours as needed for shortness of breath. 09/06/19   Joaquim Nam, MD  Ascorbic Acid (VITAMIN C) 1000 MG tablet Take 500 mg by mouth daily.    [provider]  aspirin 81 MG EC tablet Take 81 mg by mouth daily.    [provider]  benzonatate (TESSALON) 200 MG capsule Take 1 capsule (200 mg total) by mouth 3 (three) times daily as needed. 01/18/21   Joaquim Nam, MD  buPROPion [facility] SR) 150 MG 12 hr tablet Take 1 tablet (150 mg total) by mouth daily. 09/07/20   Joaquim Nam, MD  carvedilol  (COREG) 6.25 MG tablet Take 1 tablet (6.25 mg total) by mouth in the morning and at bedtime. 02/14/21   Barrett, Joline Salt, PA-C  Cholecalciferol (VITAMIN D) 125 MCG (5000 UT) CAPS Take 5,000 Units by mouth daily.    [provider]  cyclobenzaprine (FLEXERIL) 5 MG tablet Take 1 tablet (5 mg total) by mouth 3 (three) times daily as needed for muscle spasms. 07/07/19   Joaquim Nam, MD  dapagliflozin propanediol (FARXIGA) 5 MG TABS tablet Take 1 tablet (5 mg total) by mouth daily before breakfast. 02/23/21   Joaquim Nam, MD  dorzolamide-timolol (COSOPT) 22.3-6.8 MG/ML ophthalmic solution Place 1 drop into both eyes 2 (two) times daily. 02/13/20   [provider]  ELDERBERRY PO Take 1,250 mg by mouth daily.    [provider]  fenofibrate (TRICOR) 48 MG tablet Take 1 tablet (48 mg total) by mouth daily. 02/14/21   Barrett, Joline Salt, PA-C  hydrALAZINE (APRESOLINE) 10 MG tablet Take 1 tablet (10 mg total) by mouth every 12 (twelve) hours. 05/02/20 05/02/21  Tereso Newcomer T, PA-C  isosorbide mononitrate (IMDUR) 30 MG 24 hr tablet TAKE 1/2 TABLET BY MOUTH EVERY DAY 05/07/20   Wendall Stade, MD  nitroGLYCERIN (NITROSTAT) 0.4 MG SL tablet Place 1 tablet (0.4 mg total) under the tongue every 5 (five) minutes as needed for chest pain. Up to 3 doses 02/12/21   Joaquim Nam, MD  Polyethyl Glycol-Propyl Glycol (SYSTANE) 0.4-0.3 % GEL ophthalmic gel Place 1 application into the left eye at bedtime.    [provider]  rosuvastatin (CRESTOR) 20 MG tablet Take 1 tablet (20 mg total) by mouth daily. 01/16/21   Joaquim Nam, MD  traMADol (ULTRAM) 50 MG tablet Take 1 tablet (50 mg total) by mouth every 8 (eight) hours as needed (For pain not relieved by Tylenol). 02/23/21   Joaquim Nam, MD  traZODone (DESYREL) 100 MG tablet TAKE 2 TABLETS (200 MG TOTAL) BY MOUTH AT BEDTIME. 01/02/21   Joaquim Nam, MD  XARELTO 20 MG TABS tablet TAKE 1 TABLET BY MOUTH DAILY WITH SUPPER.  01/03/21   Johney Maine, MD  zinc gluconate 50 MG tablet Take 50 mg by mouth daily.    [provider]    Family History    Family History  Problem Relation Age of Onset  . Hypertension Mother   . Aneurysm Mother   . Stroke Mother   . Hypertension Father   . Heart disease Father        CAD  . Heart disease Son        MI  . Colon cancer Neg Hx   . Prostate cancer Neg Hx    He indicated that his mother is deceased. He indicated that his father is deceased. He indicated that the  status of his brother is unknown and reported the following: unknown, minimal contact. He indicated that his daughter is deceased. He indicated that his son is alive. He indicated that the status of his neg hx is unknown.  Social History    Social History   Socioeconomic History  . Marital status: Married    Spouse name: Not on file  . Number of children: 4  . Years of education: Not on file  . Highest education level: Not on file  Occupational History  . 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 Occupation: retired  Tobacco Use  . Smoking status: Former Smoker    Types: Cigars  . Smokeless tobacco: Never Used  . Tobacco comment: cigar occassionally  Vaping Use  . Vaping Use: Never used  Substance and Sexual Activity  . Alcohol use: Not Currently    Alcohol/week: 0.0 standard drinks  . Drug use: No  . Sexual activity: Not on file  Other Topics Concern  . Not on file  Social History Narrative   Divorced, lives with partner [name])- married 09/2015   3 living children, had a daughter who died at age 48 in Nov 17, 2023   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, retired as of 2019   Social Determinants of Health   Financial Resource Strain: High Risk  . Difficulty of Paying Living Expenses: Hard  Food Insecurity: No Food Insecurity  . Worried About Charity fundraiser in the Last Year: Never true  . Ran Out of Food  in the Last Year: Never true  Transportation Needs: No Transportation Needs  . Lack of Transportation (Medical): No  . Lack of Transportation (Non-Medical): No  Physical Activity: Inactive  . Days of Exercise per Week: 0 days  . Minutes of Exercise per Session: 0 min  Stress: No Stress Concern Present  . Feeling of Stress : Not at all  Social Connections: Not on file  Intimate Partner Violence: Not At Risk  . Fear of Current or Ex-Partner: No  . Emotionally Abused: No  . Physically Abused: No  . Sexually Abused: No     Review of Systems    General:  No chills, fever, night sweats or weight changes.  Cardiovascular:  No chest pain, dyspnea on exertion, edema, orthopnea, palpitations, paroxysmal nocturnal dyspnea. Dermatological: No rash, lesions/masses Respiratory: No cough, dyspnea Urologic: No hematuria, dysuria Abdominal:   No nausea, vomiting, diarrhea, bright red blood per rectum, melena, or hematemesis Neurologic:  No visual changes, wkns, changes in mental status. All other systems reviewed and are otherwise negative except as noted above.  Physical Exam    VS:  There were no vitals taken for this visit. , BMI There is no height or weight on file to calculate BMI. Affect appropriate Elderly white male  HEENT: normal Neck supple with no adenopathy JVP normal no bruits no thyromegaly Lungs clear with no wheezing and good diaphragmatic motion Heart:  S1/S2 no murmur, no rub, gallop or click PMI normal Abdomen: benighn, BS positve, no tenderness, no AAA no bruit.  No HSM or HJR Distal pulses intact with no bruits No edema Neuro non-focal Skin warm and dry No muscular weakness   Accessory Clinical Findings    Recent Labs: 02/12/2021: TSH 3.054 02/14/2021: ALT 17; Magnesium 1.6 02/21/2021: BUN 12; Creatinine, Ser 0.84; Hemoglobin 14.1; Platelets 207.0; Potassium 4.2; Sodium 139   Recent Lipid Panel    Component Value Date/Time  CHOL 87 02/13/2021 0149   TRIG  126 02/13/2021 0149   HDL 33 (L) 02/13/2021 0149   CHOLHDL 2.6 02/13/2021 0149   VLDL 25 02/13/2021 0149   LDLCALC 29 02/13/2021 0149   LDLDIRECT 64.0 08/31/2020 0925    ECG personally reviewed by me today-none today.  Echocardiogram 02/12/2021 1. Left ventricular ejection fraction, by estimation, is 45 to 50%. The  left ventricle has mildly decreased function. The left ventricle  demonstrates global hypokinesis. There is mild concentric left ventricular  hypertrophy. Left ventricular diastolic  parameters are indeterminate.  2. Right ventricular systolic function is low normal. The right  ventricular size is mildly enlarged.  3. The mitral valve is normal in structure. Trivial mitral valve  regurgitation. No evidence of mitral stenosis.  4. The aortic valve is tricuspid. Aortic valve regurgitation is not  visualized. No aortic stenosis is present.  5. The inferior vena cava is normal in size with greater than 50%  respiratory variability, suggesting right atrial pressure of 3 mmHg.   Comparison(s): Changes from prior study are noted. EF improved slightly  from prior.   Cardiac catheterization 02/13/2021  Prox RCA lesion is 50% stenosed.  Prox RCA to Mid RCA lesion is 60% stenosed.  Dist RCA lesion is 20% stenosed.  Ost LM to Mid LM lesion is 30% stenosed.  Ost Cx to Prox Cx lesion is 30% stenosed.  Mid Cx to Dist Cx lesion is 40% stenosed.  Prox LAD to Mid LAD lesion is 70% stenosed.  Mid LM to Dist LM lesion is 30% stenosed.  1st Diag lesion is 60% stenosed.   Diagnostic Dominance: Right    Intervention    Assessment & Plan   1.  Nonobstructive coronary artery disease-status post NSTEMI 02/12/2021.  Underwent cardiac catheterization 02/13/2021 which showed no changes in coronary anatomy from 2020 study.  Medical management recommended.  ASA low dose New nitro called in   Chronic combined systolic and diastolic CHF-no increased DOE or activity tolerance  today.  Echocardiogram 02/12/2021 showed LVEF of 45-50%, allergic to ACE/ARB Continue carvedilol, hydralazine, Imdur Heart healthy low-sodium diet-salty 6 given  Hyperlipidemia-02/13/2021: Cholesterol 87; HDL 33; LDL Cholesterol 29; Triglycerides 126; VLDL 25 Continue crestor no escalation of dose given age   Essential hypertension -Well controlled.  Continue current medications and low sodium Dash type diet.    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

## 2021-05-10 NOTE — Progress Notes (Signed)
Carelink Summary Report / Loop Recorder 

## 2021-05-13 ENCOUNTER — Encounter: Payer: Self-pay | Admitting: Cardiovascular Disease

## 2021-05-13 ENCOUNTER — Other Ambulatory Visit: Payer: Self-pay | Admitting: *Deleted

## 2021-05-13 ENCOUNTER — Other Ambulatory Visit: Payer: Self-pay

## 2021-05-13 ENCOUNTER — Ambulatory Visit: Payer: Medicare Other | Admitting: Cardiovascular Disease

## 2021-05-13 VITALS — BP 128/60 | HR 70 | Ht 69.0 in | Wt 175.0 lb

## 2021-05-13 DIAGNOSIS — I5022 Chronic systolic (congestive) heart failure: Secondary | ICD-10-CM | POA: Diagnosis not present

## 2021-05-13 DIAGNOSIS — D735 Infarction of spleen: Secondary | ICD-10-CM

## 2021-05-13 DIAGNOSIS — I2510 Atherosclerotic heart disease of native coronary artery without angina pectoris: Secondary | ICD-10-CM | POA: Diagnosis not present

## 2021-05-13 DIAGNOSIS — I10 Essential (primary) hypertension: Secondary | ICD-10-CM

## 2021-05-13 DIAGNOSIS — M4805 Spinal stenosis, thoracolumbar region: Secondary | ICD-10-CM

## 2021-05-13 DIAGNOSIS — M48061 Spinal stenosis, lumbar region without neurogenic claudication: Secondary | ICD-10-CM

## 2021-05-13 MED ORDER — NITROGLYCERIN 0.4 MG SL SUBL: 0.4000 mg | SUBLINGUAL_TABLET | SUBLINGUAL | 3 refills | Status: DC | PRN

## 2021-05-13 NOTE — Patient Instructions (Addendum)
Medication Instructions:  No changes *If you need a refill on your cardiac medications before your next appointment, please call your pharmacy*   Lab Work: NONE If you have labs (blood work) drawn today and your tests are completely normal, you will receive your results only by: Marland Kitchen MyChart Message (if you have MyChart) OR . A paper copy in the mail If you have any lab test that is abnormal or we need to change your treatment, we will call you to review the results.   Testing/Procedures: NONE   Follow-Up: At [facility], you and your health needs are our priority.  As part of our continuing mission to provide you with exceptional heart care, we have created designated Provider Care Teams.  These Care Teams include your primary Cardiologist (physician) and Advanced Practice Providers (APPs -  Physician Assistants and Nurse Practitioners) who all work together to provide you with the care you need, when you need it.  We recommend signing up for the patient portal called "MyChart".  Sign up information is provided on this After Visit Summary.  MyChart is used to connect with patients for Virtual Visits (Telemedicine).  Patients are able to view lab/test results, encounter notes, upcoming appointments, etc.  Non-urgent messages can be sent to your provider as well.   To learn more about what you can do with MyChart, go to [email or web address].    Your next appointment:   6 month(s)  The format for your next appointment:   In Person  Provider:   Jenkins Rouge, MD   Other Instructions REFERRAL TO DR Brandon

## 2021-05-23 DIAGNOSIS — M48062 Spinal stenosis, lumbar region with neurogenic claudication: Secondary | ICD-10-CM | POA: Diagnosis not present

## 2021-05-23 DIAGNOSIS — D0439 Carcinoma in situ of skin of other parts of face: Secondary | ICD-10-CM | POA: Diagnosis not present

## 2021-05-23 DIAGNOSIS — L820 Inflamed seborrheic keratosis: Secondary | ICD-10-CM | POA: Diagnosis not present

## 2021-05-23 DIAGNOSIS — C44319 Basal cell carcinoma of skin of other parts of face: Secondary | ICD-10-CM | POA: Diagnosis not present

## 2021-05-23 DIAGNOSIS — L821 Other seborrheic keratosis: Secondary | ICD-10-CM | POA: Diagnosis not present

## 2021-05-26 LAB — CUP PACEART REMOTE DEVICE CHECK
Date Time Interrogation Session: 20220602230433
Implantable Pulse Generator Implant Date: 20210525

## 2021-05-27 ENCOUNTER — Ambulatory Visit: Payer: Medicare Other | Admitting: Specialist

## 2021-05-27 ENCOUNTER — Ambulatory Visit (INDEPENDENT_AMBULATORY_CARE_PROVIDER_SITE_OTHER): Payer: Medicare Other

## 2021-05-27 DIAGNOSIS — I639 Cerebral infarction, unspecified: Secondary | ICD-10-CM

## 2021-05-31 ENCOUNTER — Telehealth: Payer: Self-pay

## 2021-05-31 ENCOUNTER — Other Ambulatory Visit: Payer: Self-pay | Admitting: Neurosurgery

## 2021-05-31 NOTE — Telephone Encounter (Signed)
   [facility] Pre-operative Risk Assessment    Patient Name: Nathan Fernandez  DOB: 07-Jul-1941  MRN: [id number]   HEARTCARE STAFF: - Please ensure there is not already an duplicate clearance open for this procedure. - Under Visit Info/Reason for Call, type in Other and utilize the format Clearance MM/DD/YY or Clearance TBD. Do not use dashes or single digits. - If request is for dental extraction, please clarify the # of teeth to be extracted. - If the patient is currently at the dentist's office, call Pre-Op APP to address. If the patient is not currently in the dentist office, please route to the Pre-Op pool  Request for surgical clearance:  What type of surgery is being performed? L2-3, L3-4, L4-5 Lumbar Laminectomy    When is this surgery scheduled? 06/16/2021   What type of clearance is required (medical clearance vs. Pharmacy clearance to hold med vs. Both)? BOTH  Are there any medications that need to be held prior to surgery and how long? Xarelto, Aspirin  Practice name and name of physician performing surgery? [facility] - Dr.Gary Saintclair Halsted   What is the office phone number? [phone]   7.   What is the office fax number? [phone]  8.   Anesthesia type (None, local, MAC, general) ? GENERAL   Ena Dawley 05/31/2021, 4:25 PM  _________________________________________________________________   (provider comments below)

## 2021-06-03 NOTE — Telephone Encounter (Signed)
Would recommend holding 2 doses of xarelto prior to surgery and the day of surgery.    If surgery scheduled 06/16/21, would hold med on 06/14/21 and 06/15/21.  Last dose would be on 06/13/21.  Restart of xarelto is at discretion of surgery team.    I thank all involved.  Janett Billow- please update patient about holding xarelto as above.

## 2021-06-03 NOTE — Telephone Encounter (Signed)
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    Procedure:  L2-3, L3-4, L4-5 Lumbar Laminectomy   Date of procedure: 06/16/21  Would typically hold 3 days for any spinal procedure. Xarelto is managed by PCP. I will defer to Dr. Damita Dunnings.

## 2021-06-03 NOTE — Telephone Encounter (Signed)
Dr. Johnsie Cancel   This patient is to undergo a L2-3 through L4-5 lumbar laminectomy. You last saw the patient 05/12/21 at which time he was doing very well from a CV standpoint. He is on ASA therefore surgical team asking to hold prior to surgery. 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 Pharm will review holding Xarelto given hx of splenic embolism>>likely will send to heme.   Please send your recommendations to the pre op pool   Thank you

## 2021-06-04 NOTE — Telephone Encounter (Signed)
Dr. Johnsie Cancel   Are you ok with the patient holding ASA 3-5 days prior to procedure?

## 2021-06-04 NOTE — Telephone Encounter (Signed)
    Nathan Fernandez DOB:  [birth year]  MRN:  [id number]   Primary Cardiologist: Jenkins Rouge, MD  Chart reviewed as part of pre-operative protocol coverage. Given past medical history and time since last visit, based on ACC/AHA guidelines, [name] would be at acceptable but higher risk for the planned procedure given hx as described below.   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     Procedure:  L2-3, L3-4, L4-5 Lumbar Laminectomy   Date of procedure: 06/16/21   Would typically hold 3 days for any spinal procedure however final decision was deferred to PCP given Dr. Damita Dunnings manages this. Plan is to hold 2 doses of xarelto prior to surgery and the day of surgery. If surgery scheduled 06/16/21, would hold med on 06/14/21 and 06/15/21.  Last dose would be on 06/13/21.  Restart of xarelto is at discretion of surgery team.    Per Dr. Johnsie Cancel, patient may hold ASA 3-5 days prior to procedure and resume once stable from a bleeding standpoint.    The patient was advised that if he develops new symptoms prior to surgery to contact our office to arrange for a follow-up visit, and he verbalized understanding.  I will route this recommendation to the requesting party via Epic fax function and remove from pre-op pool.  Please call with questions.  Kathyrn Drown, NP 06/04/2021, 3:15 PM

## 2021-06-14 NOTE — Pre-Procedure Instructions (Signed)
Surgical Instructions    Your procedure is scheduled on Wednesday June 29th.  Report to [facility] Main Entrance "A" at 07:30 A.M., then check in with the Admitting office.  Call this number if you have problems the morning of surgery:  [phone]   If you have any questions prior to your surgery date call [phone]: Open Monday-Friday 8am-4pm    Remember:  Do not eat or drink after midnight the night before your surgery     Take these medicines the morning of surgery with A SIP OF WATER   acetaminophen (TYLENOL)- If needed  albuterol (VENTOLIN HFA)- If needed  benzonatate (TESSALON)- If needed  cyclobenzaprine (FLEXERIL)- If needed  gabapentin (NEURONTIN)- If needed  nitroGLYCERIN (NITROSTAT)- If needed  buPROPion (WELLBUTRIN SR)  carvedilol (COREG)  fenofibrate (TRICOR)  isosorbide mononitrate (IMDUR)   rosuvastatin (CRESTOR)  dorzolamide-timolol (COSOPT)    As of today, STOP taking any Aspirin (unless otherwise instructed by your surgeon) Aleve, Naproxen, Ibuprofen, Motrin, Advil, Goody's, BC's, all herbal medications, fish oil, and all vitamins.  Please follow your physician's instructions on when to stop taking Xarelto. If you have not received instructions then please contact your physician's office.     WHAT DO I DO ABOUT MY DIABETES MEDICATION?   Do not take oral diabetes medicines (pills) the morning of surgery. DO NOT TAKE dapagliflozin propanediol (FARXIGA) the day before surgery or the morning of surgery.   HOW TO MANAGE YOUR DIABETES BEFORE AND AFTER SURGERY  Why is it important to control my blood sugar before and after surgery? Improving blood sugar levels before and after surgery helps healing and can limit problems. A way of improving blood sugar control is eating a healthy diet by:  Eating less sugar and carbohydrates  Increasing activity/exercise  Talking with your doctor about reaching your blood sugar goals High blood sugars (greater than  180 mg/dL) can raise your risk of infections and slow your recovery, so you will need to focus on controlling your diabetes during the weeks before surgery. Make sure that the doctor who takes care of your diabetes knows about your planned surgery including the date and location.  How do I manage my blood sugar before surgery? Check your blood sugar at least 4 times a day, starting 2 days before surgery, to make sure that the level is not too high or low.  Check your blood sugar the morning of your surgery when you wake up and every 2 hours until you get to the Short Stay unit.  If your blood sugar is less than 70 mg/dL, you will need to treat for low blood sugar: Do not take insulin. Treat a low blood sugar (less than 70 mg/dL) with  cup of clear juice (cranberry or apple), 4 glucose tablets, OR glucose gel. Recheck blood sugar in 15 minutes after treatment (to make sure it is greater than 70 mg/dL). If your blood sugar is not greater than 70 mg/dL on recheck, call [phone] for further instructions. Report your blood sugar to the short stay nurse when you get to Short Stay.  If you are admitted to the hospital after surgery: Your blood sugar will be checked by the staff and you will probably be given insulin after surgery (instead of oral diabetes medicines) to make sure you have good blood sugar levels. The goal for blood sugar control after surgery is 80-180 mg/dL.            Do NOT Smoke (Tobacco/Vaping) or drink Alcohol 24  hours prior to your procedure.  If you use a CPAP at night, you may bring all equipment for your overnight stay.   Contacts, glasses, piercing's, hearing aid's, dentures or partials may not be worn into surgery, please bring cases for these belongings.    For patients admitted to the hospital, discharge time will be determined by your treatment team.   Patients discharged the day of surgery will not be allowed to drive home, and someone needs to stay with them  for 24 hours.  ONLY 1 SUPPORT PERSON MAY BE PRESENT WHILE YOU ARE IN SURGERY. IF YOU ARE TO BE ADMITTED ONCE YOU ARE IN YOUR ROOM YOU WILL BE ALLOWED TWO (2) VISITORS.  Minor children may have two parents present. Special consideration for safety and communication needs will be reviewed on a case by case basis.   Special instructions:   [facility]- Preparing For Surgery  Before surgery, you can play an important role. Because skin is not sterile, your skin needs to be as free of germs as possible. You can reduce the number of germs on your skin by washing with CHG (chlorahexidine gluconate) Soap before surgery.  CHG is an antiseptic cleaner which kills germs and bonds with the skin to continue killing germs even after washing.    Oral Hygiene is also important to reduce your risk of infection.  Remember - BRUSH YOUR TEETH THE MORNING OF SURGERY WITH YOUR REGULAR TOOTHPASTE  Please do not use if you have an allergy to CHG or antibacterial soaps. If your skin becomes reddened/irritated stop using the CHG.  Do not shave (including legs and underarms) for at least 48 hours prior to first CHG shower. It is OK to shave your face.  Please follow these instructions carefully.   Shower the NIGHT BEFORE SURGERY and the MORNING OF SURGERY  If you chose to wash your hair, wash your hair first as usual with your normal shampoo.  After you shampoo, rinse your hair and body thoroughly to remove the shampoo.  Use CHG Soap as you would any other liquid soap. You can apply CHG directly to the skin and wash gently with a scrungie or a clean washcloth.   Apply the CHG Soap to your body ONLY FROM THE NECK DOWN.  Do not use on open wounds or open sores. Avoid contact with your eyes, ears, mouth and genitals (private parts). Wash Face and genitals (private parts)  with your normal soap.   Wash thoroughly, paying special attention to the area where your surgery will be performed.  Thoroughly rinse your body  with warm water from the neck down.  DO NOT shower/wash with your normal soap after using and rinsing off the CHG Soap.  Pat yourself dry with a CLEAN TOWEL.  Wear CLEAN PAJAMAS to bed the night before surgery  Place CLEAN SHEETS on your bed the night before your surgery  DO NOT SLEEP WITH PETS.   Day of Surgery: Shower with CHG soap. Do not wear jewelry, make up, nail polish, gel polish, artificial nails, or any other type of covering on natural nails including finger and toenails. If patients have artificial nails, gel coating, etc. that need to be removed by a nail salon please have this removed prior to surgery. Surgery may need to be canceled/delayed if the surgeon/ anesthesia feels like the patient is unable to be adequately monitored. Do not wear lotions, powders, perfumes/colognes, or deodorant. Do not shave 48 hours prior to surgery.  Men may  shave face and neck. Do not bring valuables to the hospital. [facility] is not responsible for any belongings or valuables. Wear Clean/Comfortable clothing the morning of surgery Remember to brush your teeth WITH YOUR REGULAR TOOTHPASTE.   Please read over the following fact sheets that you were given.

## 2021-06-17 ENCOUNTER — Encounter (HOSPITAL_COMMUNITY): Payer: Self-pay

## 2021-06-17 ENCOUNTER — Encounter (HOSPITAL_COMMUNITY)
Admission: RE | Admit: 2021-06-17 | Discharge: 2021-06-17 | Disposition: A | Discharge status: Home / Self Care | Payer: Medicare Other | Source: Ambulatory Visit | Attending: Neurosurgery | Admitting: Neurosurgery

## 2021-06-17 ENCOUNTER — Other Ambulatory Visit: Payer: Self-pay

## 2021-06-17 DIAGNOSIS — Z20822 Contact with and (suspected) exposure to covid-19: Secondary | ICD-10-CM | POA: Diagnosis not present

## 2021-06-17 DIAGNOSIS — Z01812 Encounter for preprocedural laboratory examination: Secondary | ICD-10-CM | POA: Insufficient documentation

## 2021-06-17 HISTORY — DX: Personal history of other diseases of the digestive system: Z87.19

## 2021-06-17 HISTORY — DX: Acute myocardial infarction, unspecified: I21.9

## 2021-06-17 HISTORY — DX: Malignant (primary) neoplasm, unspecified: C80.1

## 2021-06-17 LAB — GLUCOSE, CAPILLARY: Glucose-Capillary: 146 mg/dL — ABNORMAL HIGH (ref 70–99)

## 2021-06-17 LAB — CBC
HCT: 47.5 % (ref 39.0–52.0)
Hemoglobin: 16.3 g/dL (ref 13.0–17.0)
MCH: 31.1 pg (ref 26.0–34.0)
MCHC: 34.3 g/dL (ref 30.0–36.0)
MCV: 90.6 fL (ref 80.0–100.0)
Platelets: 168 10*3/uL (ref 150–400)
RBC: 5.24 MIL/uL (ref 4.22–5.81)
RDW: 12.9 % (ref 11.5–15.5)
WBC: 7.2 10*3/uL (ref 4.0–10.5)
nRBC: 0 % (ref 0.0–0.2)

## 2021-06-17 LAB — BASIC METABOLIC PANEL
Anion gap: 7 (ref 5–15)
BUN: 16 mg/dL (ref 8–23)
CO2: 27 mmol/L (ref 22–32)
Calcium: 9.1 mg/dL (ref 8.9–10.3)
Chloride: 106 mmol/L (ref 98–111)
Creatinine, Ser: 0.87 mg/dL (ref 0.61–1.24)
GFR, Estimated: >60 mL/min (ref >60)
Glucose, Bld: 154 mg/dL — ABNORMAL HIGH (ref 70–99)
Potassium: 4.4 mmol/L (ref 3.5–5.1)
Sodium: 140 mmol/L (ref 135–145)

## 2021-06-17 LAB — SURGICAL PCR SCREEN
MRSA, PCR: NEGATIVE
Staphylococcus aureus: NEGATIVE

## 2021-06-17 LAB — SARS CORONAVIRUS 2 (TAT 6-24 HRS): SARS Coronavirus 2: NEGATIVE

## 2021-06-17 NOTE — Progress Notes (Signed)
Carelink Summary Report / Loop Recorder 

## 2021-06-17 NOTE — Progress Notes (Addendum)
PCP - Elsie Stain, MD Cardiologist - Jenkins Rouge, MD  PPM/ICD - denies Device Orders - N/A Rep Notified - N/A Patient has Loop Recorder  Chest x-ray - 02/11/2021 EKG - 02/14/2021 Stress Test - denies ECHO - 02/12/2021 Cardiac Cath - 02/14/2021  Sleep Study - denies CPAP - N/A  Fasting Blood Sugar - patient is not checking CBG CBG on 06/17/2021 - 146 A1C on 06/17/2026  Blood Thinner Instructions: Per patient and his wife, last day of Aspirin and Xarelto was 06/14/2021  Aspirin Instructions: patient was instructed: As of today, STOP taking any Aspirin (unless otherwise instructed by your surgeon) Aleve, Naproxen, Ibuprofen, Motrin, Advil, Goody's, BC's, all herbal medications, fish oil, and all vitamins.  ERAS Protcol - No PRE-SURGERY Ensure or G2- N/A  COVID TEST- 06/17/2021   Anesthesia review: yes; cardiac clearance; Loop Recorder  Patient denies shortness of breath, fever, cough and chest pain at PAT appointment   All instructions explained to the patient, with a verbal understanding of the material. Patient agrees to go over the instructions while at home for a better understanding. Patient also instructed to self quarantine after being tested for COVID-19. The opportunity to ask questions was provided.

## 2021-06-18 ENCOUNTER — Encounter (HOSPITAL_COMMUNITY): Payer: Self-pay

## 2021-06-18 LAB — HEMOGLOBIN A1C
Hgb A1c MFr Bld: 7.7 % — ABNORMAL HIGH (ref 4.8–5.6)
Mean Plasma Glucose: 174 mg/dL

## 2021-06-18 NOTE — Progress Notes (Signed)
Anesthesia Chart Review:  Case: [id number] Date/Time: 06/19/21 0915   Procedure: Laminectomy and Foraminotomy - L2-L3 - L3-L4 - L4-L5 (Back)   Anesthesia type: General   Pre-op diagnosis: Stenosis   Location: [facility] OR ROOM 18 / [facility] OR   Surgeons: Kary Kos, MD       DISCUSSION: Patient is an 80 year old male scheduled for the above procedure.  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  Cardiology preoperative input outlined by Kathyrn Drown, NP on 06/04/21,  "Chart reviewed as part of pre-operative protocol coverage. Given past medical history and time since last visit, based on ACC/AHA guidelines, Nathan Fernandez would be at acceptable but higher risk for the planned procedure given hx as described below.    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     Procedure:  L2-3, L3-4, L4-5 Lumbar Laminectomy   Date of procedure: 06/16/21   Would typically hold 3 days for any spinal procedure however final decision was deferred to PCP given Dr. Damita Dunnings manages this. Plan is to hold 2 doses of xarelto prior to surgery and the day of surgery. If surgery scheduled 06/16/21, would hold med on 06/14/21 and 06/15/21.  Last dose would be on 06/13/21.  Restart of xarelto is at discretion of surgery team.     Per Dr. Johnsie Cancel, patient may hold ASA 3-5 days prior to procedure and resume once stable from a bleeding standpoint." Reported Last ASA and Xarelto 06/14/21.     06/17/2021 presurgical COVID-19 test negative.  Anesthesia team to evaluate on the day of surgery.   VS: BP (!) 142/89   Pulse 70   Temp 36.4 C (Oral)   Resp 17   Ht 5\' 9"  (1.753 m)   Wt 80.5 kg   SpO2 93%   BMI 26.21 kg/m    PROVIDERS: Tonia Ghent, MD is PCP  - Jenkins Rouge, MD is cardiologist. Last visit 05/13/21. Referral made to Dr. Saintclair Halsted for second opinion regarding lumbago.  Continue medical therapy for CAD.  No PAF on ILR. Six month follow-up planned.  Sullivan Lone, MD is hematologist. Last visit 04/30/20 with as needed follow-up. Long-term Xarelto planned due to no known provoking events causing splenic and left renal infarct. Discussed 04/16/20 "Lupus anticoagulant may be falsely positive as he is currently on Xarelto. To confirm, would have to wait 3 months and repeat test after holding Xarelto for 48 hours. Advised pt that no matter the outcome of this test life-long blood thinners would still be recommended."     LABS: Labs reviewed: Acceptable for surgery. (all labs ordered are listed, but only abnormal results are displayed)  Labs Reviewed  HEMOGLOBIN A1C - Abnormal; Notable for the following components:      Result Value   Hgb A1c MFr Bld 7.7 (*)    All other components within normal limits  BASIC METABOLIC PANEL - Abnormal; Notable for the following components:   Glucose, Bld 154 (*)    All other components within normal limits  GLUCOSE, CAPILLARY -  Abnormal; Notable for the following components:   Glucose-Capillary 146 (*)    All other components within normal limits  SURGICAL PCR SCREEN  SARS CORONAVIRUS 2 (TAT 6-24 HRS)  CBC     IMAGES: MRI L-spine 04/09/21: IMPRESSION: 1. 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 There is mass effect on the cauda equina particularly at the L4-5 level. 2. Moderate L3-4 spinal canal stenosis and moderate left neural foraminal stenosis.  1V PCXR  02/11/21: FINDINGS: Emphysematous disease and bronchitic changes. No consolidation or effusion. Stable cardiomediastinal silhouette. Recording device over left chest. No pneumothorax. IMPRESSION: Emphysematous disease and bronchitic changes.  CT Chest Lung cancer screen 11/26/20: IMPRESSION: - Lung-RADS 1, negative. Continue annual screening with low-dose chest CT without contrast in 12 months. - Aortic Atherosclerosis (ICD10-I70.0) and Emphysema (ICD10-J43.9).    EKG: 02/14/21 08:46:32: Sinus rhythm with 1st degree A-V block Right bundle branch block Abnormal ECG No significant change from 06:09 hours Confirmed by Adrian Prows ([id number]) on 02/14/2021 11:15:11 PM   CV: Cardiac cath 02/13/21 Fletcher Anon, Rogue Jury, MD): Prox RCA lesion is 50% stenosed. Prox RCA to Mid RCA lesion is 60% stenosed. Dist RCA lesion is 20% stenosed. Ost LM to Mid LM lesion is 30% stenosed. Ost Cx to Prox Cx lesion is 30% stenosed. Mid Cx to Dist Cx lesion is 40% stenosed. Prox LAD to Mid LAD lesion is 70% stenosed. Mid LM to Dist LM lesion is 30% stenosed. 1st Diag lesion is 60% stenosed.   1.  Significant two-vessel coronary artery disease involving LAD and RCA.  The LAD stenosis is between 2 large aneurysmal segments and thus its difficult to estimate stenosis severity.  The coronary arteries are overall heavily calcified. 2.  Normal left ventricular end-diastolic pressure.  Left ventricular angiography was not performed.  EF was mildly reduced by echo.   Recommendations: I do not see significant change in coronary anatomy since 2020.  It is hard to know the culprit for non-ST elevation myocardial infarction.  Recommend adding aspirin to Xarelto. Resume heparin drip 6 hours after sheath pull and transition to Xarelto tomorrow. The LAD is not suitable for PCI.   Echo 02/12/21: IMPRESSIONS   1. Left ventricular ejection fraction, by estimation, is 45 to 50%. The  left ventricle has mildly decreased function.  The left ventricle  demonstrates global hypokinesis. There is mild concentric left ventricular  hypertrophy. Left ventricular diastolic  parameters are indeterminate.   2. Right ventricular systolic function is low normal. The right  ventricular size is mildly enlarged.   3. The mitral valve is normal in structure. Trivial mitral valve  regurgitation. No evidence of mitral stenosis.   4. The aortic valve is tricuspid. Aortic valve regurgitation is not  visualized. No aortic stenosis is present.   5. The inferior vena cava is normal in size with greater than 50%  respiratory variability, suggesting right atrial pressure of 3 mmHg.  - Comparison(s): Changes from prior study are noted. EF improved slightly  from prior. [LVEF 35-40% 04/05/20 TTE; 40-45% 05/15/20 TEE]   TEE 05/15/20: IMPRESSIONS   1. Left ventricular ejection fraction, by estimation, is 40 to 45%. The  left ventricle has mildly decreased function. The left ventricle  demonstrates global hypokinesis.   2. Right ventricular systolic function is normal. The right ventricular  size is normal.   3. Bilobed atrial appendage. No left atrial/left atrial appendage  thrombus was detected.   4. The mitral valve is  normal in structure. Trivial mitral valve  regurgitation.   5. The aortic valve is tricuspid. Aortic valve regurgitation is not  visualized.   6. There is mild (Grade II) layered plaque involving the transverse and  descending aorta.   7. Agitated saline contrast bubble study was negative, with no evidence  of any interatrial shunt.  - Conclusion(s)/Recommendation(s): No cardiac source of emboli noted. 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   Hosp R facial weakness   CAD (coronary artery disease)    MI, [place], PTCA; [place] 04/18/13: Distal left main 40%, proximal LAD with several aneurysmal segments, proximal LAD  50% prior to and after aneurysmal segments, area does not appear to flow-limiting, proximal diagonal 70%, ostial circumflex 20%, proximal circumflex 20%, OM1 30-40%, proximal RCA 40%, mid RCA 30%, distal RCA 20%, EF 50% => med Rx   Cancer ([place])    skin cancer - 2020   COPD (chronic obstructive pulmonary disease) (HCC)    DM2 (diabetes mellitus, type 2) ([place])    History of ETT 1998   wnl   History of hiatal hernia    History of MRI 08/04/2009   brain- atrophy sm vess dz   HLD (hyperlipidemia)    HTN (hypertension)    Myocardial infarction ([place])    OA (osteoarthritis)    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 2/63/78    Past Surgical History:  Procedure Laterality Date   BUBBLE STUDY  05/15/2020   Procedure: BUBBLE STUDY;  Surgeon: Pixie Casino, MD;  Location: [facility];  Service: Cardiovascular;;   CARDIAC CATHETERIZATION     cataract surgery  08/2005   repair lens which moved   EYE SURGERY     LEFT HEART CATH AND CORONARY ANGIOGRAPHY N/A 01/21/2019   Procedure: LEFT HEART CATH AND CORONARY ANGIOGRAPHY;  Surgeon: Burnell Blanks, MD;  Location: [facility] CV LAB;  Service: Cardiovascular;  Laterality: N/A;   LEFT HEART CATH AND CORONARY ANGIOGRAPHY N/A 02/13/2021   Procedure: LEFT HEART CATH AND CORONARY ANGIOGRAPHY;  Surgeon: Wellington Hampshire, MD;  Location: [facility] CV LAB;  Service: Cardiovascular;  Laterality: N/A;   LOOP RECORDER INSERTION N/A 05/15/2020   Procedure: LOOP RECORDER INSERTION;  Surgeon: Thompson Grayer, MD;  Location: [facility] CV LAB;  Service: Cardiovascular;  Laterality: N/A;   stress myoview  06/29/2006   sm distal anteroseptal & apical infarct   TEE WITHOUT CARDIOVERSION N/A 05/15/2020   Procedure: TRANSESOPHAGEAL ECHOCARDIOGRAM (TEE);  Surgeon: Pixie Casino, MD;  Location: [facility] ENDOSCOPY;  Service: Cardiovascular;  Laterality: N/A;   THYROIDECTOMY, PARTIAL  1967   B9 growth   TONSILLECTOMY       MEDICATIONS:  acetaminophen (TYLENOL) 325 MG tablet   albuterol (VENTOLIN HFA) 108 (90 Base) MCG/ACT inhaler   Ascorbic Acid (VITAMIN C) 1000 MG tablet   aspirin 81 MG EC tablet   benzonatate (TESSALON) 200 MG capsule   buPROPion (WELLBUTRIN SR) 150 MG 12 hr tablet   carvedilol (COREG) 6.25 MG tablet   Cholecalciferol (VITAMIN D) 125 MCG (5000 UT) CAPS   cyclobenzaprine (FLEXERIL) 5 MG tablet   dapagliflozin propanediol (FARXIGA) 5 MG TABS tablet   dorzolamide-timolol (COSOPT) 22.3-6.8 MG/ML ophthalmic solution   ELDERBERRY PO   fenofibrate (TRICOR) 48 MG tablet   gabapentin (NEURONTIN) 100 MG capsule   hydrALAZINE (APRESOLINE) 10 MG tablet  isosorbide mononitrate (IMDUR) 30 MG 24 hr tablet   ketoconazole (NIZORAL) 2 % cream   nitroGLYCERIN (NITROSTAT) 0.4 MG SL tablet   Polyethyl Glycol-Propyl Glycol (SYSTANE) 0.4-0.3 % GEL ophthalmic gel   rosuvastatin (CRESTOR) 20 MG tablet   traZODone (DESYREL) 100 MG tablet   XARELTO 20 MG TABS tablet   No current facility-administered medications for this encounter.    Myra Gianotti, PA-C Surgical Short Stay/Anesthesiology [facility] Phone [phone] [facility] Phone [phone] 06/18/2021 2:57 PM

## 2021-06-19 ENCOUNTER — Ambulatory Visit (HOSPITAL_COMMUNITY): Payer: Medicare Other | Admitting: Vascular Surgery

## 2021-06-19 ENCOUNTER — Observation Stay (HOSPITAL_COMMUNITY)
Admission: RE | Admit: 2021-06-19 | Discharge: 2021-06-26 | Disposition: A | Discharge status: Home / Self Care | Payer: Medicare Other | Source: Ambulatory Visit | Attending: Neurosurgery | Admitting: Neurosurgery

## 2021-06-19 ENCOUNTER — Encounter (HOSPITAL_COMMUNITY): Payer: Self-pay | Admitting: Neurosurgery

## 2021-06-19 ENCOUNTER — Other Ambulatory Visit: Payer: Self-pay

## 2021-06-19 ENCOUNTER — Encounter (HOSPITAL_COMMUNITY)
Admission: RE | Disposition: A | Discharge status: Home / Self Care | Payer: Self-pay | Source: Ambulatory Visit | Attending: Neurosurgery

## 2021-06-19 ENCOUNTER — Ambulatory Visit (HOSPITAL_COMMUNITY): Payer: Medicare Other

## 2021-06-19 DIAGNOSIS — M48061 Spinal stenosis, lumbar region without neurogenic claudication: Secondary | ICD-10-CM | POA: Diagnosis not present

## 2021-06-19 DIAGNOSIS — M549 Dorsalgia, unspecified: Secondary | ICD-10-CM | POA: Diagnosis present

## 2021-06-19 DIAGNOSIS — I2510 Atherosclerotic heart disease of native coronary artery without angina pectoris: Secondary | ICD-10-CM | POA: Insufficient documentation

## 2021-06-19 DIAGNOSIS — Z87891 Personal history of nicotine dependence: Secondary | ICD-10-CM | POA: Diagnosis not present

## 2021-06-19 DIAGNOSIS — I10 Essential (primary) hypertension: Secondary | ICD-10-CM | POA: Insufficient documentation

## 2021-06-19 DIAGNOSIS — Z85828 Personal history of other malignant neoplasm of skin: Secondary | ICD-10-CM | POA: Insufficient documentation

## 2021-06-19 DIAGNOSIS — Z7901 Long term (current) use of anticoagulants: Secondary | ICD-10-CM | POA: Diagnosis not present

## 2021-06-19 DIAGNOSIS — M48062 Spinal stenosis, lumbar region with neurogenic claudication: Principal | ICD-10-CM | POA: Insufficient documentation

## 2021-06-19 DIAGNOSIS — J449 Chronic obstructive pulmonary disease, unspecified: Secondary | ICD-10-CM | POA: Diagnosis not present

## 2021-06-19 DIAGNOSIS — Z7984 Long term (current) use of oral hypoglycemic drugs: Secondary | ICD-10-CM | POA: Diagnosis not present

## 2021-06-19 DIAGNOSIS — Z9889 Other specified postprocedural states: Secondary | ICD-10-CM | POA: Diagnosis not present

## 2021-06-19 DIAGNOSIS — Z7982 Long term (current) use of aspirin: Secondary | ICD-10-CM | POA: Insufficient documentation

## 2021-06-19 DIAGNOSIS — Z79899 Other long term (current) drug therapy: Secondary | ICD-10-CM | POA: Insufficient documentation

## 2021-06-19 DIAGNOSIS — E7800 Pure hypercholesterolemia, unspecified: Secondary | ICD-10-CM | POA: Diagnosis not present

## 2021-06-19 DIAGNOSIS — Z419 Encounter for procedure for purposes other than remedying health state, unspecified: Secondary | ICD-10-CM

## 2021-06-19 DIAGNOSIS — E119 Type 2 diabetes mellitus without complications: Secondary | ICD-10-CM | POA: Insufficient documentation

## 2021-06-19 DIAGNOSIS — M5416 Radiculopathy, lumbar region: Secondary | ICD-10-CM | POA: Insufficient documentation

## 2021-06-19 DIAGNOSIS — Z20822 Contact with and (suspected) exposure to covid-19: Secondary | ICD-10-CM | POA: Diagnosis not present

## 2021-06-19 HISTORY — PX: LUMBAR LAMINECTOMY/DECOMPRESSION MICRODISCECTOMY: SHX5026

## 2021-06-19 LAB — GLUCOSE, CAPILLARY
Glucose-Capillary: 150 mg/dL — ABNORMAL HIGH (ref 70–99)
Glucose-Capillary: 157 mg/dL — ABNORMAL HIGH (ref 70–99)

## 2021-06-19 SURGERY — LUMBAR LAMINECTOMY/DECOMPRESSION MICRODISCECTOMY 3 LEVELS
Anesthesia: General | Site: Spine Lumbar

## 2021-06-19 MED ORDER — ALUM & MAG HYDROXIDE-SIMETH 200-200-20 MG/5ML PO SUSP: 30.0000 mL | Freq: Four times a day (QID) | ORAL | Status: DC | PRN

## 2021-06-19 MED ORDER — BUPROPION HCL ER (SR) 150 MG PO TB12
150.0000 mg | ORAL_TABLET | Freq: Every day | ORAL | Status: DC
  Administered 2021-06-20 – 2021-06-26 (×7): 150 mg via ORAL
  Filled 2021-06-19 (×9): qty 1

## 2021-06-19 MED ORDER — ACETAMINOPHEN 500 MG PO TABS
500.0000 mg | ORAL_TABLET | Freq: Two times a day (BID) | ORAL | Status: DC
  Administered 2021-06-19 – 2021-06-26 (×15): 500 mg via ORAL
  Filled 2021-06-19 (×16): qty 1

## 2021-06-19 MED ORDER — PHENYLEPHRINE HCL-NACL 10-0.9 MG/250ML-% IV SOLN
INTRAVENOUS | Status: DC | PRN
  Administered 2021-06-19: 30 ug/min via INTRAVENOUS

## 2021-06-19 MED ORDER — ONDANSETRON HCL 4 MG/2ML IJ SOLN: 4.0000 mg | Freq: Four times a day (QID) | INTRAMUSCULAR | Status: DC | PRN

## 2021-06-19 MED ORDER — DEXAMETHASONE SODIUM PHOSPHATE 10 MG/ML IJ SOLN
10.0000 mg | Freq: Once | INTRAMUSCULAR | Status: AC
  Administered 2021-06-19: 10 mg via INTRAVENOUS

## 2021-06-19 MED ORDER — VITAMIN D 25 MCG (1000 UNIT) PO TABS
5000.0000 [IU] | ORAL_TABLET | Freq: Every day | ORAL | Status: DC
  Administered 2021-06-20 – 2021-06-26 (×7): 5000 [IU] via ORAL
  Filled 2021-06-19 (×7): qty 5

## 2021-06-19 MED ORDER — THROMBIN 20000 UNITS EX SOLR
CUTANEOUS | Status: AC
  Filled 2021-06-19: qty 20000

## 2021-06-19 MED ORDER — LIDOCAINE-EPINEPHRINE 1 %-1:100000 IJ SOLN
INTRAMUSCULAR | Status: AC
  Filled 2021-06-19: qty 1

## 2021-06-19 MED ORDER — CYCLOBENZAPRINE HCL 10 MG PO TABS: 10.0000 mg | ORAL_TABLET | Freq: Three times a day (TID) | ORAL | Status: DC | PRN

## 2021-06-19 MED ORDER — CEFAZOLIN SODIUM-DEXTROSE 2-4 GM/100ML-% IV SOLN
2.0000 g | Freq: Three times a day (TID) | INTRAVENOUS | Status: AC
  Administered 2021-06-19 – 2021-06-21 (×6): 2 g via INTRAVENOUS
  Filled 2021-06-19 (×6): qty 100

## 2021-06-19 MED ORDER — SUCCINYLCHOLINE CHLORIDE 200 MG/10ML IV SOSY: PREFILLED_SYRINGE | INTRAVENOUS | Status: DC | PRN

## 2021-06-19 MED ORDER — ROCURONIUM BROMIDE 10 MG/ML (PF) SYRINGE
PREFILLED_SYRINGE | INTRAVENOUS | Status: AC
  Filled 2021-06-19: qty 10

## 2021-06-19 MED ORDER — ONDANSETRON HCL 4 MG/2ML IJ SOLN
INTRAMUSCULAR | Status: DC | PRN
  Administered 2021-06-19: 4 mg via INTRAVENOUS

## 2021-06-19 MED ORDER — THROMBIN 20000 UNITS EX SOLR: CUTANEOUS | Status: DC | PRN

## 2021-06-19 MED ORDER — CARVEDILOL 6.25 MG PO TABS
6.2500 mg | ORAL_TABLET | Freq: Two times a day (BID) | ORAL | Status: DC
  Administered 2021-06-19 – 2021-06-26 (×15): 6.25 mg via ORAL
  Filled 2021-06-19 (×15): qty 1

## 2021-06-19 MED ORDER — ALBUTEROL SULFATE (2.5 MG/3ML) 0.083% IN NEBU: 3.0000 mL | INHALATION_SOLUTION | Freq: Four times a day (QID) | RESPIRATORY_TRACT | Status: DC | PRN

## 2021-06-19 MED ORDER — SODIUM CHLORIDE 0.9 % IV SOLN
250.0000 mL | INTRAVENOUS | Status: DC
  Administered 2021-06-19: 250 mL via INTRAVENOUS

## 2021-06-19 MED ORDER — CHLORHEXIDINE GLUCONATE CLOTH 2 % EX PADS: 6.0000 | MEDICATED_PAD | Freq: Once | CUTANEOUS | Status: DC

## 2021-06-19 MED ORDER — HEMOSTATIC AGENTS (NO CHARGE) OPTIME
TOPICAL | Status: DC | PRN
  Administered 2021-06-19: 1 via TOPICAL

## 2021-06-19 MED ORDER — LIDOCAINE 2% (20 MG/ML) 5 ML SYRINGE
INTRAMUSCULAR | Status: DC | PRN
  Administered 2021-06-19: 100 mg via INTRAVENOUS

## 2021-06-19 MED ORDER — OXYCODONE HCL 5 MG/5ML PO SOLN: 5.0000 mg | Freq: Once | ORAL | Status: DC | PRN

## 2021-06-19 MED ORDER — LACTATED RINGERS IV SOLN: INTRAVENOUS | Status: DC

## 2021-06-19 MED ORDER — BUPIVACAINE HCL (PF) 0.25 % IJ SOLN
INTRAMUSCULAR | Status: AC
  Filled 2021-06-19: qty 30

## 2021-06-19 MED ORDER — PANTOPRAZOLE SODIUM 40 MG IV SOLR
40.0000 mg | Freq: Every day | INTRAVENOUS | Status: DC
  Administered 2021-06-19: 40 mg via INTRAVENOUS
  Filled 2021-06-19: qty 40

## 2021-06-19 MED ORDER — LIDOCAINE 2% (20 MG/ML) 5 ML SYRINGE
INTRAMUSCULAR | Status: AC
  Filled 2021-06-19: qty 5

## 2021-06-19 MED ORDER — ONDANSETRON HCL 4 MG PO TABS: 4.0000 mg | ORAL_TABLET | Freq: Four times a day (QID) | ORAL | Status: DC | PRN

## 2021-06-19 MED ORDER — ROSUVASTATIN CALCIUM 20 MG PO TABS
20.0000 mg | ORAL_TABLET | Freq: Every day | ORAL | Status: DC
  Administered 2021-06-20 – 2021-06-26 (×7): 20 mg via ORAL
  Filled 2021-06-19 (×7): qty 1

## 2021-06-19 MED ORDER — ORAL CARE MOUTH RINSE: 15.0000 mL | Freq: Once | OROMUCOSAL | Status: AC

## 2021-06-19 MED ORDER — OXYCODONE HCL 5 MG PO TABS
10.0000 mg | ORAL_TABLET | ORAL | Status: DC | PRN
  Administered 2021-06-19 – 2021-06-21 (×7): 10 mg via ORAL
  Filled 2021-06-19 (×7): qty 2

## 2021-06-19 MED ORDER — PROPOFOL 10 MG/ML IV BOLUS
INTRAVENOUS | Status: DC | PRN
  Administered 2021-06-19: 90 mg via INTRAVENOUS

## 2021-06-19 MED ORDER — DORZOLAMIDE HCL-TIMOLOL MAL 2-0.5 % OP SOLN
1.0000 [drp] | Freq: Two times a day (BID) | OPHTHALMIC | Status: DC
  Administered 2021-06-19 – 2021-06-26 (×15): 1 [drp] via OPHTHALMIC
  Filled 2021-06-19: qty 10

## 2021-06-19 MED ORDER — NITROGLYCERIN 0.4 MG SL SUBL: 0.4000 mg | SUBLINGUAL_TABLET | SUBLINGUAL | Status: DC | PRN

## 2021-06-19 MED ORDER — ASCORBIC ACID 500 MG PO TABS
1000.0000 mg | ORAL_TABLET | Freq: Every day | ORAL | Status: DC
  Administered 2021-06-20 – 2021-06-26 (×7): 1000 mg via ORAL
  Filled 2021-06-19 (×7): qty 2

## 2021-06-19 MED ORDER — DEXAMETHASONE SODIUM PHOSPHATE 10 MG/ML IJ SOLN
INTRAMUSCULAR | Status: AC
  Filled 2021-06-19: qty 1

## 2021-06-19 MED ORDER — BUPIVACAINE LIPOSOME 1.3 % IJ SUSP
INTRAMUSCULAR | Status: AC
  Filled 2021-06-19: qty 20

## 2021-06-19 MED ORDER — CYCLOBENZAPRINE HCL 5 MG PO TABS: 5.0000 mg | ORAL_TABLET | Freq: Three times a day (TID) | ORAL | Status: DC | PRN

## 2021-06-19 MED ORDER — ASPIRIN EC 81 MG PO TBEC
81.0000 mg | DELAYED_RELEASE_TABLET | Freq: Every day | ORAL | Status: DC
  Administered 2021-06-20 – 2021-06-26 (×7): 81 mg via ORAL
  Filled 2021-06-19 (×7): qty 1

## 2021-06-19 MED ORDER — CHLORHEXIDINE GLUCONATE 0.12 % MT SOLN
15.0000 mL | Freq: Once | OROMUCOSAL | Status: AC
  Administered 2021-06-19: 15 mL via OROMUCOSAL
  Filled 2021-06-19: qty 15

## 2021-06-19 MED ORDER — THROMBIN 5000 UNITS EX SOLR: OROMUCOSAL | Status: DC | PRN

## 2021-06-19 MED ORDER — FENTANYL CITRATE (PF) 250 MCG/5ML IJ SOLN
INTRAMUSCULAR | Status: AC
  Filled 2021-06-19: qty 5

## 2021-06-19 MED ORDER — PROPOFOL 10 MG/ML IV BOLUS
INTRAVENOUS | Status: AC
  Filled 2021-06-19: qty 20

## 2021-06-19 MED ORDER — ISOSORBIDE MONONITRATE ER 30 MG PO TB24
15.0000 mg | ORAL_TABLET | Freq: Every day | ORAL | Status: DC
  Administered 2021-06-19 – 2021-06-26 (×8): 15 mg via ORAL
  Filled 2021-06-19 (×8): qty 1

## 2021-06-19 MED ORDER — SUGAMMADEX SODIUM 200 MG/2ML IV SOLN
INTRAVENOUS | Status: DC | PRN
  Administered 2021-06-19: 200 mg via INTRAVENOUS

## 2021-06-19 MED ORDER — ACETAMINOPHEN 650 MG RE SUPP: 650.0000 mg | RECTAL | Status: DC | PRN

## 2021-06-19 MED ORDER — PHENOL 1.4 % MT LIQD: 1.0000 | OROMUCOSAL | Status: DC | PRN

## 2021-06-19 MED ORDER — HYDRALAZINE HCL 10 MG PO TABS
10.0000 mg | ORAL_TABLET | Freq: Two times a day (BID) | ORAL | Status: DC
  Administered 2021-06-19 – 2021-06-26 (×15): 10 mg via ORAL
  Filled 2021-06-19 (×16): qty 1

## 2021-06-19 MED ORDER — GABAPENTIN 100 MG PO CAPS
100.0000 mg | ORAL_CAPSULE | Freq: Two times a day (BID) | ORAL | Status: DC
  Administered 2021-06-19 – 2021-06-26 (×15): 100 mg via ORAL
  Filled 2021-06-19 (×16): qty 1

## 2021-06-19 MED ORDER — FENTANYL CITRATE (PF) 250 MCG/5ML IJ SOLN
INTRAMUSCULAR | Status: DC | PRN
  Administered 2021-06-19 (×4): 50 ug via INTRAVENOUS

## 2021-06-19 MED ORDER — POLYETHYL GLYCOL-PROPYL GLYCOL 0.4-0.3 % OP GEL: 1.0000 "application " | Freq: Every day | OPHTHALMIC | Status: DC | PRN

## 2021-06-19 MED ORDER — DAPAGLIFLOZIN PROPANEDIOL 5 MG PO TABS
5.0000 mg | ORAL_TABLET | Freq: Every day | ORAL | Status: DC
  Administered 2021-06-20 – 2021-06-26 (×7): 5 mg via ORAL
  Filled 2021-06-19 (×8): qty 1

## 2021-06-19 MED ORDER — FENTANYL CITRATE (PF) 100 MCG/2ML IJ SOLN
25.0000 ug | INTRAMUSCULAR | Status: DC | PRN
  Administered 2021-06-19: 25 ug via INTRAVENOUS

## 2021-06-19 MED ORDER — PHENYLEPHRINE 40 MCG/ML (10ML) SYRINGE FOR IV PUSH (FOR BLOOD PRESSURE SUPPORT)
PREFILLED_SYRINGE | INTRAVENOUS | Status: DC | PRN
  Administered 2021-06-19 (×4): 80 ug via INTRAVENOUS

## 2021-06-19 MED ORDER — TRAZODONE HCL 50 MG PO TABS
200.0000 mg | ORAL_TABLET | Freq: Every day | ORAL | Status: DC
  Administered 2021-06-19 – 2021-06-26 (×8): 200 mg via ORAL
  Filled 2021-06-19 (×9): qty 4

## 2021-06-19 MED ORDER — MENTHOL 3 MG MT LOZG: 1.0000 | LOZENGE | OROMUCOSAL | Status: DC | PRN

## 2021-06-19 MED ORDER — SODIUM CHLORIDE 0.9% FLUSH
3.0000 mL | Freq: Two times a day (BID) | INTRAVENOUS | Status: DC
  Administered 2021-06-19 – 2021-06-22 (×5): 3 mL via INTRAVENOUS

## 2021-06-19 MED ORDER — OXYCODONE HCL 5 MG PO TABS: 5.0000 mg | ORAL_TABLET | Freq: Once | ORAL | Status: DC | PRN

## 2021-06-19 MED ORDER — THROMBIN 5000 UNITS EX SOLR
CUTANEOUS | Status: AC
  Filled 2021-06-19: qty 5000

## 2021-06-19 MED ORDER — CEFAZOLIN SODIUM-DEXTROSE 2-4 GM/100ML-% IV SOLN
2.0000 g | INTRAVENOUS | Status: AC
  Administered 2021-06-19: 2 g via INTRAVENOUS
  Filled 2021-06-19: qty 100

## 2021-06-19 MED ORDER — SODIUM CHLORIDE 0.9% FLUSH: 3.0000 mL | INTRAVENOUS | Status: DC | PRN

## 2021-06-19 MED ORDER — KETOCONAZOLE 2 % EX CREA: 1.0000 "application " | TOPICAL_CREAM | Freq: Every day | CUTANEOUS | Status: DC | PRN

## 2021-06-19 MED ORDER — ACETAMINOPHEN 325 MG PO TABS
650.0000 mg | ORAL_TABLET | ORAL | Status: DC | PRN
  Administered 2021-06-22: 650 mg via ORAL
  Filled 2021-06-19: qty 2

## 2021-06-19 MED ORDER — BUPIVACAINE LIPOSOME 1.3 % IJ SUSP
INTRAMUSCULAR | Status: DC | PRN
  Administered 2021-06-19: 20 mL

## 2021-06-19 MED ORDER — 0.9 % SODIUM CHLORIDE (POUR BTL) OPTIME
TOPICAL | Status: DC | PRN
  Administered 2021-06-19: 1000 mL

## 2021-06-19 MED ORDER — ROCURONIUM BROMIDE 10 MG/ML (PF) SYRINGE
PREFILLED_SYRINGE | INTRAVENOUS | Status: DC | PRN
  Administered 2021-06-19: 30 mg via INTRAVENOUS
  Administered 2021-06-19: 50 mg via INTRAVENOUS
  Administered 2021-06-19 (×2): 20 mg via INTRAVENOUS

## 2021-06-19 MED ORDER — FENOFIBRATE 54 MG PO TABS
54.0000 mg | ORAL_TABLET | Freq: Every day | ORAL | Status: DC
  Administered 2021-06-19 – 2021-06-26 (×8): 54 mg via ORAL
  Filled 2021-06-19 (×8): qty 1

## 2021-06-19 MED ORDER — FENTANYL CITRATE (PF) 100 MCG/2ML IJ SOLN
INTRAMUSCULAR | Status: AC
  Filled 2021-06-19: qty 2

## 2021-06-19 MED ORDER — LIDOCAINE-EPINEPHRINE 1 %-1:100000 IJ SOLN
INTRAMUSCULAR | Status: DC | PRN
  Administered 2021-06-19: 10 mL

## 2021-06-19 MED ORDER — HYDROMORPHONE HCL 1 MG/ML IJ SOLN: 0.5000 mg | INTRAMUSCULAR | Status: DC | PRN

## 2021-06-19 MED ORDER — ELDERBERRY 575 MG/5ML PO SYRP: 1250.0000 mg | ORAL_SOLUTION | Freq: Two times a day (BID) | ORAL | Status: DC

## 2021-06-19 MED ORDER — ONDANSETRON HCL 4 MG/2ML IJ SOLN
INTRAMUSCULAR | Status: AC
  Filled 2021-06-19: qty 2

## 2021-06-19 SURGICAL SUPPLY — 51 items
ADH SKN CLS APL DERMABOND .7 (GAUZE/BANDAGES/DRESSINGS) ×1
APL SKNCLS STERI-STRIP NONHPOA (GAUZE/BANDAGES/DRESSINGS) ×1
BAG COUNTER SPONGE SURGICOUNT (BAG) ×2 IMPLANT
BAG SPNG CNTER NS LX DISP (BAG) ×1
BENZOIN TINCTURE PRP APPL 2/3 (GAUZE/BANDAGES/DRESSINGS) ×2 IMPLANT
BLADE SURG 11 STRL SS (BLADE) ×2 IMPLANT
BUR CUTTER 7.0 ROUND (BURR) ×2 IMPLANT
BUR MATCHSTICK NEURO 3.0 LAGG (BURR) ×2 IMPLANT
CANISTER SUCT 3000ML PPV (MISCELLANEOUS) ×2 IMPLANT
CARTRIDGE OIL MAESTRO DRILL (MISCELLANEOUS) ×1 IMPLANT
DERMABOND ADVANCED (GAUZE/BANDAGES/DRESSINGS) ×1
DERMABOND ADVANCED .7 DNX12 (GAUZE/BANDAGES/DRESSINGS) ×1 IMPLANT
DIFFUSER DRILL AIR PNEUMATIC (MISCELLANEOUS) ×2 IMPLANT
DRAPE HALF SHEET 40X57 (DRAPES) IMPLANT
DRAPE LAPAROTOMY 100X72X124 (DRAPES) ×2 IMPLANT
DRAPE SURG 17X23 STRL (DRAPES) ×2 IMPLANT
DRSG OPSITE 6X11 MED (GAUZE/BANDAGES/DRESSINGS) ×1 IMPLANT
DRSG OPSITE POSTOP 4X8 (GAUZE/BANDAGES/DRESSINGS) ×1 IMPLANT
DURAPREP 26ML APPLICATOR (WOUND CARE) ×2 IMPLANT
ELECT REM PT RETURN 9FT ADLT (ELECTROSURGICAL) ×2
ELECTRODE REM PT RTRN 9FT ADLT (ELECTROSURGICAL) ×1 IMPLANT
GAUZE 4X4 16PLY ~~LOC~~+RFID DBL (SPONGE) ×2 IMPLANT
GAUZE SPONGE 4X4 12PLY STRL (GAUZE/BANDAGES/DRESSINGS) ×1 IMPLANT
GLOVE SURG ENC MOIS LTX SZ7 (GLOVE) ×2 IMPLANT
GLOVE SURG ENC MOIS LTX SZ8 (GLOVE) ×3 IMPLANT
GLOVE SURG LTX SZ7.5 (GLOVE) ×2 IMPLANT
GLOVE SURG UNDER LTX SZ8.5 (GLOVE) ×3 IMPLANT
GOWN STRL REUS W/ TWL LRG LVL3 (GOWN DISPOSABLE) ×1 IMPLANT
GOWN STRL REUS W/ TWL XL LVL3 (GOWN DISPOSABLE) ×2 IMPLANT
GOWN STRL REUS W/TWL 2XL LVL3 (GOWN DISPOSABLE) IMPLANT
GOWN STRL REUS W/TWL LRG LVL3 (GOWN DISPOSABLE) ×4
GOWN STRL REUS W/TWL XL LVL3 (GOWN DISPOSABLE) ×4
HEMOSTAT POWDER KIT SURGIFOAM (HEMOSTASIS) ×1 IMPLANT
KIT BASIN OR (CUSTOM PROCEDURE TRAY) ×2 IMPLANT
KIT TURNOVER KIT B (KITS) ×2 IMPLANT
NDL SPNL 22GX3.5 QUINCKE BK (NEEDLE) ×1 IMPLANT
NEEDLE HYPO 22GX1.5 SAFETY (NEEDLE) ×2 IMPLANT
NEEDLE SPNL 22GX3.5 QUINCKE BK (NEEDLE) ×2 IMPLANT
NS IRRIG 1000ML POUR BTL (IV SOLUTION) ×2 IMPLANT
OIL CARTRIDGE MAESTRO DRILL (MISCELLANEOUS) ×2
PACK LAMINECTOMY NEURO (CUSTOM PROCEDURE TRAY) ×2 IMPLANT
SEALANT ADHERUS EXTEND TIP (MISCELLANEOUS) ×1 IMPLANT
SPONGE SURGIFOAM ABS GEL 100 (HEMOSTASIS) ×1 IMPLANT
STRIP CLOSURE SKIN 1/2X4 (GAUZE/BANDAGES/DRESSINGS) ×2 IMPLANT
SUT VIC AB 0 CT1 18XCR BRD8 (SUTURE) ×1 IMPLANT
SUT VIC AB 0 CT1 8-18 (SUTURE) ×2
SUT VIC AB 2-0 CT1 18 (SUTURE) ×2 IMPLANT
SUT VIC AB 4-0 PS2 27 (SUTURE) ×2 IMPLANT
TOWEL GREEN STERILE (TOWEL DISPOSABLE) ×2 IMPLANT
TOWEL GREEN STERILE FF (TOWEL DISPOSABLE) ×2 IMPLANT
WATER STERILE IRR 1000ML POUR (IV SOLUTION) ×2 IMPLANT

## 2021-06-19 NOTE — Op Note (Signed)
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  Work-up revealed severe thecal sac compression with a virtual complete block at L4-5 and severe foraminal stenosis at all levels at L2-3 L3-4 and L4-5.  And due to the patient's progression of clinical syndrome imaging findings and failed conservative treatment I recommended decompressive laminectomies foraminotomies at those levels.  I extensively reviewed the risks and benefits of the operation with him as well as perioperative course expectations of outcome and alternatives of surgery and he understood and agreed to proceed forward.  Operative procedure: Patient was brought into the OR was induced under general anesthesia positioned prone on the Wilson frame his back was prepped and draped in routine sterile fashion utilizing anatomical landmarks an incision was drawn out and infiltrated with 10 cc lidocaine with epi and a midline incision was made and Bovie electrocautery was used take down the subcutaneous tissue and subperiosteal dissection was carried lamina from L2 down to L5.  Intraoperative x-ray confirmed defecation appropriate level.  So then the spinous process of L3 and L4 removed in their entirety the inferior aspect the spinous process at L2 and superior aspect spinous process at L3 was removed central disc decompression was begun at L3-4 marching up the entire lamina  of L3 was removed centrally the inferior one third of L2 was removed there was severe hourglass compression of thecal sac at 2 3 and 3 4 marching inferiorly I also then unroofed and remove the lamina of L4 down to the superior aspect and remove the superior one third of lamina of L5.  Utilizing high-speed drill the lateral lamina was drilled away and then under biting the medial facets helped decompress the central canal large osteophytes and severe marked hourglass compression of thecal sac was appreciated primarily at L3-4 and L4-5 and this was all teased off of the dura removed in piecemeal fashion with a 2 and 3 mm Kerrison punch.  Foraminotomies were performed at the L3, L4 and L5 nerve roots bilaterally I inspected the disc base bilaterally especially on the left at L3-4 and felt that this was not significantly compressive.  So after adequate central and foraminal decompression the wound was copiously irrigated meticulous hemostasis was maintained while I was exploring the flap foramina with a coronary dilator there was some clear fluid that egressed from underneath the residual lamina of 2 towards the left side out that foramen.  I did not see any inadvertent dural tear nor did the spinal fluid leak when I was not pushing the dura away with a coronary dilator.  I did elect to put a piece of Gelfoam at the very top of the laminotomy defect and squirted the green fibrin glue over that area as well then laid Gelfoam and Surgifoam along the gutters and because there was no active spinal fluid leaking when I was manipulating the dura and because it was a wide decompressive laminectomy over multiple levels I did elect to leave a Hemovac drain with the idea that we take that out first thing in  the morning.  So injected Exparel in the fascia closed wound in layers with Vicryl and a running 4 subcuticular Dermabond benzoin Steri-Strips and sterile dressing was applied patient recovery in stable condition.  At the end  the case all needle counts and sponge counts were correct.

## 2021-06-19 NOTE — Transfer of Care (Signed)
Immediate Anesthesia Transfer of Care Note  Patient: Nathan Fernandez  Procedure(s) Performed: Lumbar Two-Three, Lumbar Three-Four, Lumbar Four-Five Laminectomy and Foraminotomy (Spine Lumbar)  Patient Location: PACU  Anesthesia Type:General  Level of Consciousness: awake, alert  and oriented  Airway & Oxygen Therapy: Patient Spontanous Breathing and Patient connected to face mask oxygen  Post-op Assessment: Report given to RN, Post -op Vital signs reviewed and stable and Patient moving all extremities  Post vital signs: Reviewed and stable  Last Vitals:  Vitals Value Taken Time  BP 148/100 06/19/21 1231  Temp    Pulse 82 06/19/21 1236  Resp 17 06/19/21 1236  SpO2 98 % 06/19/21 1236  Vitals shown include unvalidated device data.  Last Pain:  Vitals:   06/19/21 0804  TempSrc:   PainSc: 2       Patients Stated Pain Goal: 0 (18/28/83 3744)  Complications: No notable events documented.

## 2021-06-19 NOTE — Anesthesia Procedure Notes (Signed)
Procedure Name: Intubation Date/Time: 06/19/2021 9:47 AM Performed by: Janace Litten, CRNA Pre-anesthesia Checklist: Patient identified, Emergency Drugs available, Suction available and Patient being monitored Patient Re-evaluated:Patient Re-evaluated prior to induction Oxygen Delivery Method: Circle System Utilized Preoxygenation: Pre-oxygenation with 100% oxygen Induction Type: IV induction Ventilation: Mask ventilation without difficulty Laryngoscope Size: Mac and 4 Grade View: Grade I Tube type: Oral Tube size: 7.5 mm Number of attempts: 1 Airway Equipment and Method: Stylet and Oral airway Placement Confirmation: ETT inserted through vocal cords under direct vision, positive ETCO2 and breath sounds checked- equal and bilateral Secured at: 22 cm Tube secured with: Tape Dental Injury: Teeth and Oropharynx as per pre-operative assessment

## 2021-06-19 NOTE — Anesthesia Preprocedure Evaluation (Signed)
Anesthesia Evaluation  Patient identified by MRN, date of birth, ID band Patient awake    Reviewed: Allergy & Precautions, H&P , NPO status , Patient's Chart, lab work & pertinent test results  Airway Mallampati: II   Neck ROM: full    Dental   Pulmonary COPD, former smoker,    breath sounds clear to auscultation       Cardiovascular hypertension, + CAD and + Past MI   Rhythm:regular Rate:Normal     Neuro/Psych PSYCHIATRIC DISORDERS Depression  Neuromuscular disease    GI/Hepatic hiatal hernia,   Endo/Other  diabetes, Type 2  Renal/GU      Musculoskeletal  (+) Arthritis ,   Abdominal   Peds  Hematology   Anesthesia Other Findings   Reproductive/Obstetrics                             Anesthesia Physical Anesthesia Plan  ASA: 3  Anesthesia Plan: General   Post-op Pain Management:    Induction: Intravenous  PONV Risk Score and Plan: 2 and Ondansetron, Dexamethasone and Treatment may vary due to age or medical condition  Airway Management Planned: Oral ETT  Additional Equipment:   Intra-op Plan:   Post-operative Plan: Extubation in OR  Informed Consent: I have reviewed the patients History and Physical, chart, labs and discussed the procedure including the risks, benefits and alternatives for the proposed anesthesia with the patient or authorized representative who has indicated his/her understanding and acceptance.     Dental advisory given  Plan Discussed with: CRNA, Anesthesiologist and Surgeon  Anesthesia Plan Comments:         Anesthesia Quick Evaluation

## 2021-06-19 NOTE — H&P (Signed)
Nathan Fernandez is an 80 y.o. male.   Chief Complaint: Back and left greater than right leg pain HPI: 80 year old gentleman with longstanding back bilateral hip and leg pain neurogenic claudication worse on the left.  Work-up is revealed severe spinal stenosis L2-3 L3-4 L4-5.  Due to the patient's progressive clinical syndrome imaging findings failed conservative treatment I recommended decompressive laminectomies bilaterally at L2-3, L3-4 L4-5.  We will inspect the disc bases.  We have gone extensively over the risks and benefits of the operation with the patient as well as perioperative course expectations of outcome and alternatives to surgery and he understands and agrees to proceed forward.  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   Hosp R facial weakness   CAD (coronary artery disease)    MI, [place], PTCA; [place] 04/18/13: Distal left main 40%, proximal LAD with several aneurysmal segments, proximal LAD 50% prior to and after aneurysmal segments, area does not appear to flow-limiting, proximal diagonal 70%, ostial circumflex 20%, proximal circumflex 20%, OM1 30-40%, proximal RCA 40%, mid RCA 30%, distal RCA 20%, EF 50% => med Rx   Cancer ([place])    skin cancer - 2020   COPD (chronic obstructive pulmonary disease) (HCC)    DM2 (diabetes mellitus, type 2) ([place])    History of ETT 1998   wnl   History of hiatal hernia    History of MRI 08/04/2009   brain- atrophy sm vess dz   HLD (hyperlipidemia)    HTN (hypertension)    Myocardial infarction ([place])    OA (osteoarthritis)    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 3/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    Past Surgical History:  Procedure Laterality Date   BUBBLE STUDY  05/15/2020   Procedure: BUBBLE STUDY;  Surgeon: Pixie Casino, MD;  Location: [facility];  Service: Cardiovascular;;   CARDIAC CATHETERIZATION     cataract surgery  08/2005   repair lens which moved   EYE SURGERY     LEFT HEART CATH  AND CORONARY ANGIOGRAPHY N/A 01/21/2019   Procedure: LEFT HEART CATH AND CORONARY ANGIOGRAPHY;  Surgeon: Burnell Blanks, MD;  Location: [facility];  Service: Cardiovascular;  Laterality: N/A;   LEFT HEART CATH AND CORONARY ANGIOGRAPHY N/A 02/13/2021   Procedure: LEFT HEART CATH AND CORONARY ANGIOGRAPHY;  Surgeon: Wellington Hampshire, MD;  Location: [facility];  Service: Cardiovascular;  Laterality: N/A;   LOOP RECORDER INSERTION N/A 05/15/2020   Procedure: LOOP RECORDER INSERTION;  Surgeon: Thompson Grayer, MD;  Location: [facility];  Service: Cardiovascular;  Laterality: N/A;   stress myoview  06/29/2006   sm distal anteroseptal & apical infarct   TEE WITHOUT CARDIOVERSION N/A 05/15/2020   Procedure: TRANSESOPHAGEAL ECHOCARDIOGRAM (TEE);  Surgeon: Pixie Casino, MD;  Location: [facility];  Service: Cardiovascular;  Laterality: N/A;   THYROIDECTOMY, PARTIAL  1967   B9 growth   TONSILLECTOMY      Family History  Problem Relation Age of Onset   Hypertension Mother    Aneurysm Mother    Stroke Mother    Hypertension Father    Heart disease Father        CAD   Heart disease Son        MI   Colon cancer Neg Hx    Prostate cancer Neg Hx    Social History:  reports that he has quit smoking. His smoking use included cigars. He has  never used smokeless tobacco. He reports current alcohol use. He reports that he does not use drugs.  Allergies:  Allergies  Allergen Reactions   Ace Inhibitors Swelling    Swelling of the tongue   Angiotensin Receptor Blockers Swelling    Tongue swelling   Enalapril Swelling    Tongue swelling    Medications Prior to Admission  Medication Sig Dispense Refill   acetaminophen (TYLENOL) 325 MG tablet Take 2 tablets (650 mg total) by mouth 2 (two) times daily as needed. (Patient taking differently: Take 500 mg by mouth 2 (two) times daily.)     albuterol (VENTOLIN HFA) 108 (90 Base) MCG/ACT inhaler Inhale 1-2 puffs into the  lungs every 6 (six) hours as needed for shortness of breath. (Patient taking differently: Inhale 1-2 puffs into the lungs every 6 (six) hours as needed (COPD).) 18 g 5   Ascorbic Acid (VITAMIN C) 1000 MG tablet Take 1,000 mg by mouth daily.     aspirin 81 MG EC tablet Take 81 mg by mouth daily.     buPROPion (WELLBUTRIN SR) 150 MG 12 hr tablet Take 1 tablet (150 mg total) by mouth daily. (Patient taking differently: Take 150 mg by mouth at bedtime.)     carvedilol (COREG) 6.25 MG tablet Take 1 tablet (6.25 mg total) by mouth in the morning and at bedtime. 60 tablet 6   Cholecalciferol (VITAMIN D) 125 MCG (5000 UT) CAPS Take 5,000 Units by mouth daily.     cyclobenzaprine (FLEXERIL) 5 MG tablet Take 1 tablet (5 mg total) by mouth 3 (three) times daily as needed for muscle spasms. 30 tablet 2   dapagliflozin propanediol (FARXIGA) 5 MG TABS tablet Take 1 tablet (5 mg total) by mouth daily before breakfast. 30 tablet 5   dorzolamide-timolol (COSOPT) 22.3-6.8 MG/ML ophthalmic solution Place 1 drop into both eyes 2 (two) times daily.     ELDERBERRY PO Take 1,250 mg by mouth 2 (two) times daily.     fenofibrate (TRICOR) 48 MG tablet Take 1 tablet (48 mg total) by mouth daily. 30 tablet 6   gabapentin (NEURONTIN) 100 MG capsule Take 1-2 capsules (100-200 mg total) by mouth 3 (three) times daily as needed (for pain.  sedation caution). (Patient taking differently: Take 100 mg by mouth 2 (two) times daily.) 60 capsule 1   hydrALAZINE (APRESOLINE) 10 MG tablet TAKE 1 TABLET (10 MG TOTAL) BY MOUTH EVERY 12 (TWELVE) HOURS. 180 tablet 3   isosorbide mononitrate (IMDUR) 30 MG 24 hr tablet TAKE 1/2 TABLET BY MOUTH EVERY DAY (Patient taking differently: Take 15 mg by mouth daily.) 45 tablet 3   ketoconazole (NIZORAL) 2 % cream Apply 1 application topically daily as needed for irritation. 30 g 5   nitroGLYCERIN (NITROSTAT) 0.4 MG SL tablet Place 1 tablet (0.4 mg total) under the tongue every 5 (five) minutes as needed  for chest pain. Up to 3 doses 25 tablet 3   Polyethyl Glycol-Propyl Glycol (SYSTANE) 0.4-0.3 % GEL ophthalmic gel Place 1 application into the left eye daily as needed (Dry eyes).     rosuvastatin (CRESTOR) 20 MG tablet Take 1 tablet (20 mg total) by mouth daily. 90 tablet 3   traZODone (DESYREL) 100 MG tablet TAKE 2 TABLETS (200 MG TOTAL) BY MOUTH AT BEDTIME. 180 tablet 1   XARELTO 20 MG TABS tablet TAKE 1 TABLET BY MOUTH DAILY WITH SUPPER (Patient taking differently: Take 20 mg by mouth daily with supper.) 30 tablet 2   benzonatate (TESSALON)  200 MG capsule Take 1 capsule (200 mg total) by mouth 3 (three) times daily as needed for cough. (Patient not taking: Reported on 06/18/2021) 30 capsule 2    Results for orders placed or performed during the hospital encounter of 06/19/21 (from the past 48 hour(s))  Glucose, capillary     Status: Abnormal   Collection Time: 06/19/21  7:55 AM  Result Value Ref Range   Glucose-Capillary 150 (H) 70 - 99 mg/dL    Comment: Glucose reference range applies only to samples taken after fasting for at least 8 hours.   No results found.  Review of Systems  Musculoskeletal:  Positive for back pain.  Neurological:  Positive for numbness.   Blood pressure (!) 174/97, pulse 80, temperature 97.6 F (36.4 C), temperature source Oral, resp. rate 18, height 5\' 9"  (1.753 m), weight 79.4 kg, SpO2 95 %. Physical Exam HENT:     Head: Normocephalic.     Nose: Nose normal.  Eyes:     Pupils: Pupils are equal, round, and reactive to light.  Cardiovascular:     Rate and Rhythm: Normal rate.  Pulmonary:     Effort: Pulmonary effort is normal.  Abdominal:     General: Abdomen is flat.  Musculoskeletal:        General: Normal range of motion.  Skin:    General: Skin is warm.  Neurological:     General: No focal deficit present.     Mental Status: He is alert.     Assessment/Plan 80 year old gentleman presents for decompressive laminectomies L2-3, L3-4,  L4-5.  Elaina Hoops, MD 06/19/2021, 9:34 AM

## 2021-06-20 ENCOUNTER — Encounter (HOSPITAL_COMMUNITY): Payer: Self-pay | Admitting: Neurosurgery

## 2021-06-20 DIAGNOSIS — Z20822 Contact with and (suspected) exposure to covid-19: Secondary | ICD-10-CM | POA: Diagnosis not present

## 2021-06-20 DIAGNOSIS — M48062 Spinal stenosis, lumbar region with neurogenic claudication: Secondary | ICD-10-CM | POA: Diagnosis not present

## 2021-06-20 DIAGNOSIS — Z7982 Long term (current) use of aspirin: Secondary | ICD-10-CM | POA: Diagnosis not present

## 2021-06-20 DIAGNOSIS — Z7984 Long term (current) use of oral hypoglycemic drugs: Secondary | ICD-10-CM | POA: Diagnosis not present

## 2021-06-20 DIAGNOSIS — Z87891 Personal history of nicotine dependence: Secondary | ICD-10-CM | POA: Diagnosis not present

## 2021-06-20 DIAGNOSIS — Z7901 Long term (current) use of anticoagulants: Secondary | ICD-10-CM | POA: Diagnosis not present

## 2021-06-20 DIAGNOSIS — Z85828 Personal history of other malignant neoplasm of skin: Secondary | ICD-10-CM | POA: Diagnosis not present

## 2021-06-20 DIAGNOSIS — I2510 Atherosclerotic heart disease of native coronary artery without angina pectoris: Secondary | ICD-10-CM | POA: Diagnosis not present

## 2021-06-20 DIAGNOSIS — M5416 Radiculopathy, lumbar region: Secondary | ICD-10-CM | POA: Diagnosis not present

## 2021-06-20 DIAGNOSIS — Z79899 Other long term (current) drug therapy: Secondary | ICD-10-CM | POA: Diagnosis not present

## 2021-06-20 DIAGNOSIS — J449 Chronic obstructive pulmonary disease, unspecified: Secondary | ICD-10-CM | POA: Diagnosis not present

## 2021-06-20 DIAGNOSIS — E119 Type 2 diabetes mellitus without complications: Secondary | ICD-10-CM | POA: Diagnosis not present

## 2021-06-20 DIAGNOSIS — I10 Essential (primary) hypertension: Secondary | ICD-10-CM | POA: Diagnosis not present

## 2021-06-20 MED ORDER — PANTOPRAZOLE SODIUM 40 MG PO TBEC
40.0000 mg | DELAYED_RELEASE_TABLET | Freq: Every day | ORAL | Status: DC
  Administered 2021-06-20 – 2021-06-26 (×7): 40 mg via ORAL
  Filled 2021-06-20 (×7): qty 1

## 2021-06-20 NOTE — Evaluation (Signed)
Occupational Therapy Evaluation Patient Details Name: Nathan Fernandez MRN: [id number] DOB: 12/22/1941 Today's Date: 06/20/2021    History of Present Illness 80 yo male with onset of BLE and back pain with failed conservative tx was taken to OR for decompressive laminectomies from L2-3, L3-4, and L4-5 with partial medial facetectomies and foraminotomies of L3, L4 and L5 nerve roots.  PMHx:  bells palsy, COPD, DM, CAD, splenic infarct, MI, OA   Clinical Impression   Pt presents with decline in function and safety with ADLs and ADL mobility with impaired strength, balance and endurance. PTA pt lived at home with his wife and was Ind with ADLs/selfcare, mobility (used cane when outside). Pt currently requires mod A with LB ADLs, mod A with toileting tasks, min A with mobility using RW. Pt would benefit from acute OT services to address impairments to maximize level of function and safety    Follow Up Recommendations  Home health OT    Equipment Recommendations  3 in 1 bedside commode;Other (comment);Tub/shower seat (RW, reacher, LH bath sponge, sock aid)    Recommendations for Other Services       Precautions / Restrictions Precautions Precautions: Fall;Back Precaution Comments: reviewd back precautions Splint/Cast: no back brace ordered by MD Restrictions Weight Bearing Restrictions: No      Mobility Bed Mobility Overal bed mobility: Needs Assistance Bed Mobility: Rolling;Sidelying to Sit;Sit to Sidelying Rolling: Min guard Sidelying to sit: Min assist     Sit to sidelying: Min assist      Transfers Overall transfer level: Needs assistance Equipment used: Rolling walker (2 wheeled);1 person hand held assist Transfers: Sit to/from Stand Sit to Stand: Min assist              Balance Overall balance assessment: Needs assistance Sitting-balance support: Feet supported Sitting balance-Leahy Scale: Fair     Standing balance support: Bilateral upper extremity  supported;During functional activity Standing balance-Leahy Scale: Poor                             ADL either performed or assessed with clinical judgement   ADL Overall ADL's : Needs assistance/impaired Eating/Feeding: Independent;Sitting   Grooming: Wash/dry hands;Wash/dry face;Min guard;Standing   Upper Body Bathing: Set up;Supervision/ safety;Sitting   Lower Body Bathing: Moderate assistance   Upper Body Dressing : Set up;Supervision/safety;Sitting   Lower Body Dressing: Moderate assistance   Toilet Transfer: Minimal assistance;Ambulation;RW   Toileting- Clothing Manipulation and Hygiene: Minimal assistance;Sit to/from stand       Functional mobility during ADLs: Minimal assistance;Rolling walker;Cueing for safety       Vision Patient Visual Report: No change from baseline       Perception     Praxis      Pertinent Vitals/Pain Pain Assessment: Faces Faces Pain Scale: Hurts even more Pain Descriptors / Indicators: Operative site guarding;Grimacing;Stabbing Pain Intervention(s): Limited activity within patient's tolerance;Monitored during session;Premedicated before session;Repositioned     Hand Dominance Right   Extremity/Trunk Assessment Upper Extremity Assessment Upper Extremity Assessment: Generalized weakness   Lower Extremity Assessment Lower Extremity Assessment: Defer to PT evaluation   Cervical / Trunk Assessment Cervical / Trunk Assessment: Kyphotic Cervical / Trunk Exceptions: spinal surgery   Communication Communication Communication: No difficulties   Cognition Arousal/Alertness: Awake/alert Behavior During Therapy: WFL for tasks assessed/performed Overall Cognitive Status: Within Functional Limits for tasks assessed  General Comments       Exercises     Shoulder Instructions      Home Living Family/patient expects to be discharged to:: Private residence Living  Arrangements: Spouse/significant other Available Help at Discharge: Family;Available 24 hours/day Type of Home: House Home Access: Stairs to enter [employer] of Steps: 4 Entrance Stairs-Rails: None Home Layout: One level     Bathroom Shower/Tub: Tub/shower unit;Walk-in shower   Bathroom Toilet: Standard     Home Equipment: Cane - single point          Prior Functioning/Environment Level of Independence: Independent        Comments: Ind with ADLs/selfcare, was mainly using SPC to go out per pt        OT Problem List: Decreased strength;Impaired balance (sitting and/or standing);Pain;Decreased activity tolerance;Decreased knowledge of use of DME or AE      OT Treatment/Interventions: Self-care/ADL training;Patient/family education;Balance training;Therapeutic activities;DME and/or AE instruction    OT Goals(Current goals can be found in the care plan section) Acute Rehab OT Goals Patient Stated Goal: to hurt less, go home OT Goal Formulation: With patient/family Time For Goal Achievement: 07/04/21 Potential to Achieve Goals: Good ADL Goals Pt Will Perform Grooming: with supervision;with set-up;standing;with caregiver independent in assisting Pt Will Perform Upper Body Bathing: with set-up;with modified independence;sitting;with caregiver independent in assisting Pt Will Perform Lower Body Bathing: with min assist;with adaptive equipment;with caregiver independent in assisting Pt Will Perform Upper Body Dressing: with set-up;with modified independence;sitting;with caregiver independent in assisting Pt Will Perform Lower Body Dressing: with min assist;with adaptive equipment;with caregiver independent in assisting Pt Will Transfer to Toilet: with min guard assist;with supervision;ambulating Pt Will Perform Toileting - Clothing Manipulation and hygiene: with min guard assist;with supervision;sit to/from stand;with caregiver independent in assisting Pt Will  Perform Tub/Shower Transfer: with min assist;with min guard assist;ambulating  OT Frequency: Min 2X/week   Barriers to D/C:            Co-evaluation              AM-PAC OT "6 Clicks" Daily Activity     Outcome Measure Help from another person eating meals?: None Help from another person taking care of personal grooming?: A Little Help from another person toileting, which includes using toliet, bedpan, or urinal?: A Lot Help from another person bathing (including washing, rinsing, drying)?: A Lot Help from another person to put on and taking off regular upper body clothing?: None Help from another person to put on and taking off regular lower body clothing?: A Lot 6 Click Score: 17   End of Session Equipment Utilized During Treatment: Gait belt;Rolling walker;Other (comment) (3 in 1)  Activity Tolerance: Patient limited by pain Patient left: in bed;with call bell/phone within reach;with family/visitor present;Other (comment) (sitting EOB)  OT Visit Diagnosis: Unsteadiness on feet (R26.81);Other abnormalities of gait and mobility (R26.89);Muscle weakness (generalized) (M62.81);Pain Pain - part of body:  (back)                Time: 1456-1530 OT Time Calculation (min): 34 min Charges:  OT General Charges $OT Visit: 1 Visit OT Evaluation $OT Eval Moderate Complexity: 1 Mod OT Treatments $Self Care/Home Management : 8-22 mins    Britt Bottom 06/20/2021, 4:06 PM

## 2021-06-20 NOTE — TOC Progression Note (Signed)
Transition of Care [facility]) - Progression Note    Patient Details  Name: Nathan Fernandez MRN: [id number] Date of Birth: Sep 07, 1941  Transition of Care [facility]) CM/SW Contact  Milinda Antis, [place] Phone Number: 06/20/2021, 8:35 AM  Clinical Narrative:     Patient has Bell Palsy.  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  Lind Covert, MSW, LCSWA         Expected Discharge Plan and Services                                                 Social Determinants of Health (SDOH) Interventions    Readmission Risk Interventions No flowsheet data found.

## 2021-06-20 NOTE — Anesthesia Postprocedure Evaluation (Signed)
Anesthesia Post Note  Patient: Nathan Fernandez  Procedure(s) Performed: Lumbar Two-Three, Lumbar Three-Four, Lumbar Four-Five Laminectomy and Foraminotomy (Spine Lumbar)     Patient location during evaluation: PACU Anesthesia Type: General Level of consciousness: awake and alert Pain management: pain level controlled Vital Signs Assessment: post-procedure vital signs reviewed and stable Respiratory status: spontaneous breathing, nonlabored ventilation, respiratory function stable and patient connected to nasal cannula oxygen Cardiovascular status: blood pressure returned to baseline and stable Postop Assessment: no apparent nausea or vomiting Anesthetic complications: no   No notable events documented.  Last Vitals:  Vitals:   06/20/21 0637 06/20/21 0700  BP: 102/61 107/63  Pulse:  79  Resp: 18 18  Temp: 36.5 C (!) 36.4 C  SpO2: 91% 90%    Last Pain:  Vitals:   06/20/21 1103  TempSrc:   PainSc: 4                  Kyron Schlitt S

## 2021-06-20 NOTE — Plan of Care (Signed)

## 2021-06-20 NOTE — Progress Notes (Signed)
RN from [facility] came to pull pt hemovac drain out.

## 2021-06-20 NOTE — Progress Notes (Signed)
PT Cancellation Note  Patient Details Name: Nathan Fernandez MRN: [id number] DOB: Jan 15, 1941   Cancelled Treatment:    Reason Eval/Treat Not Completed: Other (comment).  Pt is on the phone and has not been available to work with PT.  Will try to see as pt and time allow.   Ramond Dial 06/20/2021, 10:15 AM  Mee Hives, PT MS Acute Rehab Dept. Number: [place] and [place]

## 2021-06-20 NOTE — Progress Notes (Signed)
Subjective: Patient reports  significant provement preoperative leg pain condition of back soreness but manageable no headache  Objective: Vital signs in last 24 hours: Temp:  [97.5 F (36.4 C)-98.1 F (36.7 C)] 97.5 F (36.4 C) (06/30 0700) Pulse Rate:  [77-94] 79 (06/30 0700) Resp:  [12-24] 18 (06/30 0700) BP: (101-162)/(61-102) 107/63 (06/30 0700) SpO2:  [89 %-100 %] 90 % (06/30 0700)  Intake/Output from previous day: 06/29 0701 - 06/30 0700 In: 1708 [I.V.:1708] Out: 1760 [Urine:1300; Drains:260; Blood:200] Intake/Output this shift: No intake/output data recorded.  Strength 5 out of 5 wound clean dry and intact  Lab Results: Recent Labs    06/17/21 1041  WBC 7.2  HGB 16.3  HCT 47.5  PLT 168   BMET Recent Labs    06/17/21 1041  NA 140  K 4.4  CL 106  CO2 27  GLUCOSE 154*  BUN 16  CREATININE 0.87  CALCIUM 9.1    Studies/Results: DG Lumbar Spine 1 View  Result Date: 06/19/2021 CLINICAL DATA:  Localization. EXAM: LUMBAR SPINE - 1 VIEW COMPARISON:  MRI of April 09, 2021. FINDINGS: Single intraoperative cross-table lateral projection was obtained of the lumbar spine. Surgical retractors are seen posterior to the L3-4 and L4-5 disc spaces. Surgical probe is directed toward posterior elements of L5. IMPRESSION: Surgical localization as described above. Electronically Signed   By: Marijo Conception M.D.   On: 06/19/2021 15:21    Assessment/Plan: Postop day 1 decompressive laminectomy.  Will DC Hemovac mobilize with physical Occupational Therapy.  LOS: 0 days     Elaina Hoops 06/20/2021, 8:00 AM

## 2021-06-20 NOTE — Evaluation (Signed)
Physical Therapy Evaluation Patient Details Name: Nathan Fernandez MRN: [id number] DOB: 1941-03-24 Today's Date: 06/20/2021   History of Present Illness  80 yo male with onset of BLE and back pain with failed conservative tx was taken to OR for decompressive laminectomies from L2-3, L3-4, and L4-5 with partial medial facetectomies and foraminotomies of L3, L4 and L5 nerve roots.  PMHx:  bells palsy, COPD, DM, CAD, splenic infarct, MI, OA  Clinical Impression  Pt was seen for initiation of mobility with struggle due to pain complaints.  He was premedicated and then remedicated, enough to walk a short trip and sit up in chair.  Pt has been provided back instructions, and will reinforce these with him.  Will need to have one more session to cover stairs, and then hopefully will be able to get home.  Has not been able to tolerate much activity today.  Reinforced with staff a time limit to sit OOB in chair.    Follow Up Recommendations Home health PT;Supervision for mobility/OOB    Equipment Recommendations  Rolling walker with 5" wheels    Recommendations for Other Services       Precautions / Restrictions Precautions Precautions: Fall;Back Precaution Booklet Issued: Yes (comment) Precaution Comments: unfamiliar with precautions Required Braces or Orthoses:  (NO SPINAL BRACE) Restrictions Weight Bearing Restrictions: No      Mobility  Bed Mobility Overal bed mobility: Needs Assistance Bed Mobility: Supine to Sit     Supine to sit: Min assist     General bed mobility comments: min assist to roll and assist scooting out to EOB    Transfers Overall transfer level: Needs assistance Equipment used: Rolling walker (2 wheeled);1 person hand held assist Transfers: Sit to/from Stand Sit to Stand: Min assist         General transfer comment: mn assist to control balance and steady initial standing  Ambulation/Gait Ambulation/Gait assistance: Min assist Gait Distance (Feet): 6  Feet Assistive device: Rolling walker (2 wheeled);1 person hand held assist Gait Pattern/deviations: Step-to pattern;Step-through pattern;Decreased stride length;Trunk flexed Gait velocity: reduced   General Gait Details: cued postural correction, pt in pain and was unable to tolerate more walking  Stairs Stairs:  (deferred due to pt refusal)          Wheelchair Mobility    Modified Rankin (Stroke Patients Only)       Balance Overall balance assessment: Needs assistance Sitting-balance support: Feet supported Sitting balance-Leahy Scale: Fair     Standing balance support: Bilateral upper extremity supported;During functional activity Standing balance-Leahy Scale: Poor                               Pertinent Vitals/Pain Pain Assessment: 0-10 Pain Score: 9  Pain Location: back Pain Descriptors / Indicators: Operative site guarding;Grimacing;Stabbing Pain Intervention(s): Limited activity within patient's tolerance;Monitored during session;Premedicated before session;Repositioned;RN gave pain meds during session    [place] expects to be discharged to:: Private residence Living Arrangements: Spouse/significant other Available Help at Discharge: Family;Available 24 hours/day Type of Home: House Home Access: Stairs to enter Entrance Stairs-Rails: None Entrance Stairs-Number of Steps: 4 Home Layout: One level Home Equipment: Cane - single point Additional Comments: reports regret he did not set up to be accessible    Prior Function Level of Independence: Independent with assistive device(s);Independent         Comments: was mainly using SPC to go out per pt  Hand Dominance   Dominant Hand: Right    Extremity/Trunk Assessment   Upper Extremity Assessment Upper Extremity Assessment: Defer to OT evaluation    Lower Extremity Assessment Lower Extremity Assessment: Generalized weakness    Cervical / Trunk  Assessment Cervical / Trunk Assessment: Kyphotic;Other exceptions (new laminectomies L2-L5)  Communication   Communication: No difficulties  Cognition Arousal/Alertness: Awake/alert Behavior During Therapy: WFL for tasks assessed/performed;Anxious Overall Cognitive Status: Within Functional Limits for tasks assessed                                        General Comments General comments (skin integrity, edema, etc.): Pt has an intact drain on back, not removed yet. Pt is in some pain but was remedicated by nursing to add to dose from earlier, able to sit in a chair with pillow support    Exercises     Assessment/Plan    PT Assessment Patient needs continued PT services  PT Problem List Decreased strength;Decreased range of motion;Decreased activity tolerance;Decreased balance;Decreased mobility;Decreased knowledge of use of DME;Pain;Decreased skin integrity       PT Treatment Interventions DME instruction;Gait training;Stair training;Functional mobility training;Therapeutic exercise;Therapeutic activities;Balance training;Neuromuscular re-education;Patient/family education    PT Goals (Current goals can be found in the Care Plan section)  Acute Rehab PT Goals Patient Stated Goal: to hurt less PT Goal Formulation: With patient Time For Goal Achievement: 06/28/21 Potential to Achieve Goals: Good    Frequency Min 5X/week   Barriers to discharge Inaccessible home environment;Decreased caregiver support home with wife who is older and with stairs no rails to enter    Co-evaluation               AM-PAC PT "6 Clicks" Mobility  Outcome Measure Help needed turning from your back to your side while in a flat bed without using bedrails?: A Little Help needed moving from lying on your back to sitting on the side of a flat bed without using bedrails?: A Little Help needed moving to and from a bed to a chair (including a wheelchair)?: A Little Help needed  standing up from a chair using your arms (e.g., wheelchair or bedside chair)?: A Little Help needed to walk in hospital room?: A Little Help needed climbing 3-5 steps with a railing? : A Lot 6 Click Score: 17    End of Session Equipment Utilized During Treatment: Gait belt Activity Tolerance: Patient limited by lethargy;Patient limited by pain Patient left: in chair;with call bell/phone within reach;with chair alarm set Nurse Communication: Mobility status;Patient requests pain meds PT Visit Diagnosis: Unsteadiness on feet (R26.81);Muscle weakness (generalized) (M62.81);Pain Pain - Right/Left:  (center of back) Pain - part of body:  (back)    Time: 1020-1056 PT Time Calculation (min) (ACUTE ONLY): 36 min   Charges:   PT Evaluation $PT Eval Moderate Complexity: 1 Mod PT Treatments $Gait Training: 8-22 mins       Ramond Dial 06/20/2021, 12:43 PM  Mee Hives, PT MS Acute Rehab Dept. Number: [place] and [place]

## 2021-06-21 DIAGNOSIS — I10 Essential (primary) hypertension: Secondary | ICD-10-CM | POA: Diagnosis not present

## 2021-06-21 DIAGNOSIS — M48062 Spinal stenosis, lumbar region with neurogenic claudication: Secondary | ICD-10-CM | POA: Diagnosis not present

## 2021-06-21 DIAGNOSIS — Z79899 Other long term (current) drug therapy: Secondary | ICD-10-CM | POA: Diagnosis not present

## 2021-06-21 DIAGNOSIS — Z7984 Long term (current) use of oral hypoglycemic drugs: Secondary | ICD-10-CM | POA: Diagnosis not present

## 2021-06-21 DIAGNOSIS — Z7982 Long term (current) use of aspirin: Secondary | ICD-10-CM | POA: Diagnosis not present

## 2021-06-21 DIAGNOSIS — E119 Type 2 diabetes mellitus without complications: Secondary | ICD-10-CM | POA: Diagnosis not present

## 2021-06-21 DIAGNOSIS — I2510 Atherosclerotic heart disease of native coronary artery without angina pectoris: Secondary | ICD-10-CM | POA: Diagnosis not present

## 2021-06-21 DIAGNOSIS — Z87891 Personal history of nicotine dependence: Secondary | ICD-10-CM | POA: Diagnosis not present

## 2021-06-21 DIAGNOSIS — Z20822 Contact with and (suspected) exposure to covid-19: Secondary | ICD-10-CM | POA: Diagnosis not present

## 2021-06-21 DIAGNOSIS — Z7901 Long term (current) use of anticoagulants: Secondary | ICD-10-CM | POA: Diagnosis not present

## 2021-06-21 DIAGNOSIS — Z85828 Personal history of other malignant neoplasm of skin: Secondary | ICD-10-CM | POA: Diagnosis not present

## 2021-06-21 DIAGNOSIS — M5416 Radiculopathy, lumbar region: Secondary | ICD-10-CM | POA: Diagnosis not present

## 2021-06-21 DIAGNOSIS — J449 Chronic obstructive pulmonary disease, unspecified: Secondary | ICD-10-CM | POA: Diagnosis not present

## 2021-06-21 MED ORDER — OXYCODONE HCL 5 MG PO TABS
5.0000 mg | ORAL_TABLET | Freq: Four times a day (QID) | ORAL | Status: DC | PRN
  Administered 2021-06-21 – 2021-06-26 (×11): 5 mg via ORAL
  Filled 2021-06-21 (×11): qty 1

## 2021-06-21 MED ORDER — OXYCODONE HCL 5 MG PO TABS: 5.0000 mg | ORAL_TABLET | Freq: Four times a day (QID) | ORAL | 0 refills | Status: DC | PRN

## 2021-06-21 MED ORDER — ISOSORBIDE MONONITRATE ER 30 MG PO TB24: 15.0000 mg | ORAL_TABLET | Freq: Every day | ORAL | 3 refills | Status: DC

## 2021-06-21 MED ORDER — BUPROPION HCL ER (SR) 150 MG PO TB12: 150.0000 mg | ORAL_TABLET | Freq: Every day | ORAL | Status: DC

## 2021-06-21 MED ORDER — ACETAMINOPHEN 325 MG PO TABS: 500.0000 mg | ORAL_TABLET | Freq: Two times a day (BID) | ORAL | Status: DC

## 2021-06-21 MED ORDER — GABAPENTIN 100 MG PO CAPS: 100.0000 mg | ORAL_CAPSULE | Freq: Two times a day (BID) | ORAL | 3 refills | Status: DC

## 2021-06-21 NOTE — Progress Notes (Signed)
Subjective: Patient reports  doing well back back pain but no leg pain  Objective: Vital signs in last 24 hours: Temp:  [97.7 F (36.5 C)-98.4 F (36.9 C)] 98.4 F (36.9 C) (07/01 0731) Pulse Rate:  [74-99] 89 (07/01 0731) Resp:  [16-18] 18 (07/01 0731) BP: (100-148)/(62-93) 109/62 (07/01 0731) SpO2:  [90 %-94 %] 90 % (07/01 0543)  Intake/Output from previous day: 06/30 0701 - 07/01 0700 In: 1109.8 [P.O.:600; I.V.:209.8; IV Piggyback:300] Out: 1900 [Urine:1900] Intake/Output this shift: No intake/output data recorded.  Arouse from sleep speech a little slurred patient seems to be overmedicated potentially strength 5 out of 5 incision clean dry and intact  Lab Results: No results for input(s): WBC, HGB, HCT, PLT in the last 72 hours. BMET No results for input(s): NA, K, CL, CO2, GLUCOSE, BUN, CREATININE, CALCIUM in the last 72 hours.  Studies/Results: DG Lumbar Spine 1 View  Result Date: 06/19/2021 CLINICAL DATA:  Localization. EXAM: LUMBAR SPINE - 1 VIEW COMPARISON:  MRI of April 09, 2021. FINDINGS: Single intraoperative cross-table lateral projection was obtained of the lumbar spine. Surgical retractors are seen posterior to the L3-4 and L4-5 disc spaces. Surgical probe is directed toward posterior elements of L5. IMPRESSION: Surgical localization as described above. Electronically Signed   By: Marijo Conception M.D.   On: 06/19/2021 15:21    Assessment/Plan: Postop day 2 decompressive laminectomy 3 levels physical therapy is recommending home health patient does not feel like he is ready go home I do think the patient has a potentially being stable for discharge this afternoon however patient is requesting tomorrow morning.  We will work with physical therapy leave the option open  LOS: 0 days     Elaina Hoops 06/21/2021, 7:47 AM

## 2021-06-21 NOTE — Progress Notes (Signed)
Physical Therapy Treatment Patient Details Name: TAEVEON KEESLING MRN: [id number] DOB: 1941/08/04 Today's Date: 06/21/2021    History of Present Illness 80 yo male with onset of BLE and back pain with failed conservative tx was taken to OR for decompressive laminectomies from L2-3, L3-4, and L4-5 with partial medial facetectomies and foraminotomies of L3, L4 and L5 nerve roots.  PMHx:  bells palsy, COPD, DM, CAD, splenic infarct, MI, OA    PT Comments    Pt was seen for mobility on RW to take a short walk, then was able to climb steps with buckling appearance on RLE.  Pt requires a lot of assist to sit down, with dense cues for sequence and the posture of back and legs to flex hips into the activity.   Pt is noted by MD earlier today to be very medicated, possibly the reason for his appearance.  Recommending he go to rehab now, but pt is going to refuse this.  Follow along with as much care as possible since pt is going to want to go directly home.  Would benefit from one more trip on the stairs tomorrow.  Follow Up Recommendations  Supervision for mobility/OOB;SNF     Equipment Recommendations  Rolling walker with 5" wheels    Recommendations for Other Services       Precautions / Restrictions Precautions Precautions: Fall;Back Precaution Booklet Issued: Yes (comment) Splint/Cast: no back brace ordered by MD Restrictions Weight Bearing Restrictions: No    Mobility  Bed Mobility Overal bed mobility: Needs Assistance             General bed mobility comments: in chair when PT arrived    Transfers Overall transfer level: Needs assistance Equipment used: Rolling walker (2 wheeled);1 person hand held assist Transfers: Sit to/from Stand Sit to Stand: Min assist         General transfer comment: min assist to power up  Ambulation/Gait Ambulation/Gait assistance: Min assist Gait Distance (Feet): 12 Feet Assistive device: Rolling walker (2 wheeled);1 person hand held  assist Gait Pattern/deviations: Step-to pattern;Step-through pattern;Decreased stride length;Wide base of support Gait velocity: reduced   General Gait Details: pt requires cues for posture, for navigation to sit   Stairs Stairs: Yes Stairs assistance: Min assist Stair Management: One rail Right;One rail Left;Forwards Number of Stairs: 10 General stair comments: requires help to navigate up and down, trying to go in a reciprocal pattern but encouraged him to go one step at a time.  Buckling a bit on RLE coming down the second time   Wheelchair Mobility    Modified Rankin (Stroke Patients Only)       Balance Overall balance assessment: Needs assistance Sitting-balance support: Feet supported Sitting balance-Leahy Scale: Fair     Standing balance support: Bilateral upper extremity supported;During functional activity Standing balance-Leahy Scale: Poor                              Cognition Arousal/Alertness: Awake/alert Behavior During Therapy: WFL for tasks assessed/performed Overall Cognitive Status: Within Functional Limits for tasks assessed                                 General Comments: mild cognitive changes but can follow directions with cues      Exercises      General Comments General comments (skin integrity, edema, etc.): Pt is not as independent  as was hoped, now requiring help with all mobilty.  Recommend change to SNFbut pt is going to refuse      Pertinent Vitals/Pain Pain Assessment: Faces Faces Pain Scale: Hurts even more Pain Location: back Pain Descriptors / Indicators: Grimacing;Guarding Pain Intervention(s): Limited activity within patient's tolerance;Monitored during session;Premedicated before session;Repositioned    Home Living                      Prior Function            PT Goals (current goals can now be found in the care plan section) Acute Rehab PT Goals Patient Stated Goal: to hurt less,  go home Progress towards PT goals: Progressing toward goals    Frequency    Min 5X/week      PT Plan Discharge plan needs to be updated    Co-evaluation PT/OT/SLP Co-Evaluation/Treatment: Yes Reason for Co-Treatment: For patient/therapist safety PT goals addressed during session: Mobility/safety with mobility;Proper use of DME        AM-PAC PT "6 Clicks" Mobility   Outcome Measure  Help needed turning from your back to your side while in a flat bed without using bedrails?: A Little Help needed moving from lying on your back to sitting on the side of a flat bed without using bedrails?: A Little Help needed moving to and from a bed to a chair (including a wheelchair)?: A Little Help needed standing up from a chair using your arms (e.g., wheelchair or bedside chair)?: A Little Help needed to walk in hospital room?: A Little Help needed climbing 3-5 steps with a railing? : A Lot 6 Click Score: 17    End of Session Equipment Utilized During Treatment: Gait belt Activity Tolerance: Patient limited by lethargy;Patient limited by pain Patient left: in chair;with call bell/phone within reach;with chair alarm set Nurse Communication: Mobility status;Patient requests pain meds PT Visit Diagnosis: Unsteadiness on feet (R26.81);Muscle weakness (generalized) (M62.81);Pain;Other abnormalities of gait and mobility (R26.89) Pain - Right/Left:  (back) Pain - part of body:  (back)     Time: 5093-2671 PT Time Calculation (min) (ACUTE ONLY): 34 min  Charges:  $Gait Training: 8-22 mins                   Ramond Dial 06/21/2021, 1:02 PM  Mee Hives, PT MS Acute Rehab Dept. Number: [place] and [place]

## 2021-06-21 NOTE — Progress Notes (Signed)
Occupational Therapy Treatment Patient Details Name: Nathan Fernandez MRN: [id number] DOB: 19-Feb-1941 Today's Date: 06/21/2021    History of present illness 80 yo male with onset of BLE and back pain with failed conservative tx was taken to OR for decompressive laminectomies from L2-3, L3-4, and L4-5 with partial medial facetectomies and foraminotomies of L3, L4 and L5 nerve roots.  PMHx:  bells palsy, COPD, DM, CAD, splenic infarct, MI, OA   OT comments  Pt making progress with functional goals. Session focused on functional mobility/ADL mobility safety using RW. Pt requires a lot of assist for stand- sit transitions, with heavy verbal and physical cues for sequence and the posture of back and legs to flex hips into the activity.  Pt is noted by MD earlier today to be very medicated, possibly the reason for his cognition.  Recommending he go to post acute rehab now, but pt is most likely going to refuse this. OT will continue to follow acutely to maximize level of function and safety  Follow Up Recommendations  Supervision/Assistance - 24 hour;SNF (pt most likely will refuse SNF)    Equipment Recommendations  3 in 1 bedside commode;Other (comment);Tub/shower seat (RW, reacher, sock aid, LH bath sponge)    Recommendations for Other Services      Precautions / Restrictions Precautions Precautions: Fall;Back Precaution Booklet Issued: Yes (comment) Precaution Comments: reviewd back precautions Splint/Cast: no back brace ordered by MD Restrictions Weight Bearing Restrictions: No       Mobility Bed Mobility Overal bed mobility: Needs Assistance             General bed mobility comments: pt up with PT upon arrival    Transfers Overall transfer level: Needs assistance Equipment used: Rolling walker (2 wheeled);1 person hand held assist Transfers: Sit to/from Stand Sit to Stand: Min assist         General transfer comment: min assist to power up    Balance Overall balance  assessment: Needs assistance Sitting-balance support: Feet supported Sitting balance-Leahy Scale: Fair     Standing balance support: Bilateral upper extremity supported;During functional activity Standing balance-Leahy Scale: Poor                             ADL either performed or assessed with clinical judgement   ADL Overall ADL's : Needs assistance/impaired     Grooming: Wash/dry hands;Wash/dry face;Min guard;Standing       Lower Body Bathing: Moderate assistance;Minimal assistance;Sitting/lateral leans Lower Body Bathing Details (indicate cue type and reason): simulated seated on 3 in 1 Upper Body Dressing : Set up;Sitting   Lower Body Dressing: Moderate assistance   Toilet Transfer: Minimal assistance;Ambulation;RW;Comfort height toilet;Cueing for safety;Cueing for sequencing   Toileting- Clothing Manipulation and Hygiene: Minimal assistance;Sit to/from stand;Cueing for safety       Functional mobility during ADLs: Minimal assistance;Rolling walker;Cueing for safety       Vision Patient Visual Report: No change from baseline     Perception     Praxis      Cognition Arousal/Alertness: Awake/alert Behavior During Therapy: WFL for tasks assessed/performed Overall Cognitive Status: Within Functional Limits for tasks assessed                                 General Comments: mild cognitive changes but can follow directions with cues        Exercises  Shoulder Instructions       General Comments Pt is not as independent as was hoped, now requiring help with all mobilty.  Recommend change to SNFbut pt is going to refuse    Pertinent Vitals/ Pain       Pain Assessment: Faces Faces Pain Scale: Hurts even more Pain Location: back Pain Descriptors / Indicators: Grimacing;Guarding Pain Intervention(s): Limited activity within patient's tolerance;Monitored during session;Repositioned  Home Living                                           Prior Functioning/Environment              Frequency           Progress Toward Goals  OT Goals(current goals can now be found in the care plan section)  Progress towards OT goals: Progressing toward goals  Acute Rehab OT Goals Patient Stated Goal: to hurt less, go home  Plan Discharge plan needs to be updated;Frequency remains appropriate    Co-evaluation    PT/OT/SLP Co-Evaluation/Treatment: Yes Reason for Co-Treatment: For patient/therapist safety;To address functional/ADL transfers PT goals addressed during session: Mobility/safety with mobility;Proper use of DME        AM-PAC OT "6 Clicks" Daily Activity     Outcome Measure   Help from another person eating meals?: None Help from another person taking care of personal grooming?: A Little Help from another person toileting, which includes using toliet, bedpan, or urinal?: A Lot Help from another person bathing (including washing, rinsing, drying)?: A Lot Help from another person to put on and taking off regular upper body clothing?: None Help from another person to put on and taking off regular lower body clothing?: A Lot 6 Click Score: 17    End of Session Equipment Utilized During Treatment: Gait belt;Rolling walker;Other (comment) (3 in 1)  OT Visit Diagnosis: Unsteadiness on feet (R26.81);Other abnormalities of gait and mobility (R26.89);Muscle weakness (generalized) (M62.81);Pain Pain - part of body:  (back)   Activity Tolerance Patient limited by pain;Patient limited by fatigue   Patient Left in bed;with call bell/phone within reach;with family/visitor present;Other (comment)   Nurse Communication          Time: 1537-9432 OT Time Calculation (min): 20 min  Charges: OT General Charges $OT Visit: 1 Visit OT Treatments $Therapeutic Activity: 8-22 mins     Britt Bottom 06/21/2021, 2:41 PM

## 2021-06-22 DIAGNOSIS — Z87891 Personal history of nicotine dependence: Secondary | ICD-10-CM | POA: Diagnosis not present

## 2021-06-22 DIAGNOSIS — J449 Chronic obstructive pulmonary disease, unspecified: Secondary | ICD-10-CM | POA: Diagnosis not present

## 2021-06-22 DIAGNOSIS — M48062 Spinal stenosis, lumbar region with neurogenic claudication: Secondary | ICD-10-CM | POA: Diagnosis not present

## 2021-06-22 DIAGNOSIS — Z7982 Long term (current) use of aspirin: Secondary | ICD-10-CM | POA: Diagnosis not present

## 2021-06-22 DIAGNOSIS — Z20822 Contact with and (suspected) exposure to covid-19: Secondary | ICD-10-CM | POA: Diagnosis not present

## 2021-06-22 DIAGNOSIS — M5416 Radiculopathy, lumbar region: Secondary | ICD-10-CM | POA: Diagnosis not present

## 2021-06-22 DIAGNOSIS — Z85828 Personal history of other malignant neoplasm of skin: Secondary | ICD-10-CM | POA: Diagnosis not present

## 2021-06-22 DIAGNOSIS — I10 Essential (primary) hypertension: Secondary | ICD-10-CM | POA: Diagnosis not present

## 2021-06-22 DIAGNOSIS — Z7984 Long term (current) use of oral hypoglycemic drugs: Secondary | ICD-10-CM | POA: Diagnosis not present

## 2021-06-22 DIAGNOSIS — I2510 Atherosclerotic heart disease of native coronary artery without angina pectoris: Secondary | ICD-10-CM | POA: Diagnosis not present

## 2021-06-22 DIAGNOSIS — E119 Type 2 diabetes mellitus without complications: Secondary | ICD-10-CM | POA: Diagnosis not present

## 2021-06-22 DIAGNOSIS — Z7901 Long term (current) use of anticoagulants: Secondary | ICD-10-CM | POA: Diagnosis not present

## 2021-06-22 DIAGNOSIS — Z79899 Other long term (current) drug therapy: Secondary | ICD-10-CM | POA: Diagnosis not present

## 2021-06-22 MED ORDER — RIVAROXABAN 20 MG PO TABS: 20.0000 mg | ORAL_TABLET | Freq: Every day | ORAL | 2 refills | Status: DC

## 2021-06-22 MED ORDER — ALBUTEROL SULFATE HFA 108 (90 BASE) MCG/ACT IN AERS: 1.0000 | INHALATION_SPRAY | Freq: Four times a day (QID) | RESPIRATORY_TRACT | Status: DC | PRN

## 2021-06-22 NOTE — Progress Notes (Signed)
Patient informed this nurse that because of a phone conversation he just had with his wife, he has decided to change his mind and discharge to a SNF instead of home with OT/PT.  Answering service called.  Awaiting return call.

## 2021-06-22 NOTE — Progress Notes (Addendum)
Occupational Therapy Treatment Patient Details Name: Nathan Fernandez MRN: [id number] DOB: 23-Mar-1941 Today's Date: 06/22/2021    History of present illness 80 yo male with onset of BLE and back pain with failed conservative tx was taken to OR for decompressive laminectomies from L2-3, L3-4, and L4-5 with partial medial facetectomies and foraminotomies of L3, L4 and L5 nerve roots.  PMHx:  bells palsy, COPD, DM, CAD, splenic infarct, MI, OA   OT comments  Pt presents awake and alert, agreeable to session but concerns of d/c to post acute setting, reports interest in additional rehab as opposed to returning to home setting. SW advised. Pt able to recall 3/3 spinal precautions this session. Decreased physical assistance for bed mobility from supine to sit with HOB elevated to prepare for ADL engagement. Pt demonstrated doffing of sock with reacher mod I, education and demonstration of donning sock with sock aide, supervision with cues for sequencing. Pt still required cues for hand placement with sit to stand transition with RW, however, improved stand to sit transition this session. Less VC's for reaching for seated surface. Pt will benefit from continued skilled OT prior to returning home to ensure increased independence and safety with ADLs prior to DC.     Follow Up Recommendations  Supervision/Assistance - 24 hour;SNF (pt most likely will refuse SNF)    Equipment Recommendations  3 in 1 bedside commode;Other (comment);Tub/shower seat (RW, reacher, sock aid, LH bath sponge)    Recommendations for Other Services      Precautions / Restrictions Precautions Precautions: Fall;Back Precaution Booklet Issued: Yes (comment) Precaution Comments: reviewd back precautions, pt able to recall 3/3 spinal precautions. Required Braces or Orthoses:  (NO SPINAL BRACE) Splint/Cast: no back brace ordered by MD Restrictions Weight Bearing Restrictions: No       Mobility Bed Mobility Overal bed mobility:  Needs Assistance Bed Mobility: Rolling;Sidelying to Sit;Sit to Sidelying     Supine to sit: Min guard     General bed mobility comments: HOB reclined, did not perform log roll but able to spine alined to present to sitting up right. No physical assistance required.    Transfers Overall transfer level: Needs assistance Equipment used: Rolling walker (2 wheeled);1 person hand held assist Transfers: Sit to/from Stand Sit to Stand: Min assist         General transfer comment: min assist mostly for safety and cueing, decreased physical assistance to power up this session.    Balance Overall balance assessment: Needs assistance Sitting-balance support: Feet supported Sitting balance-Leahy Scale: Fair     Standing balance support: Bilateral upper extremity supported;During functional activity Standing balance-Leahy Scale: Poor                             ADL either performed or assessed with clinical judgement   ADL Overall ADL's : Needs assistance/impaired                     Lower Body Dressing: Supervision/safety Lower Body Dressing Details (indicate cue type and reason): Pt educated on use of AE ( reacher and sock aide for LB dressing). Pt able to demonstrate doffing of sock with reacher, educated of sequencing with sock aide, able to demonstrate with demonstration and VC's as needed. Toilet Transfer: Minimal assistance;Ambulation;RW;Cueing for safety;Cueing for sequencing Toilet Transfer Details (indicate cue type and reason): simulated toilet transfer from bed to recliner with min A cues for hand placement and RW management.  Decreased cues for stand to sit, however, CGA for safety with transition.         Functional mobility during ADLs: Minimal assistance;Rolling walker;Cueing for safety       Vision       Perception     Praxis      Cognition Arousal/Alertness: Awake/alert Behavior During Therapy: WFL for tasks assessed/performed Overall  Cognitive Status: Within Functional Limits for tasks assessed                                 General Comments: mild cognitive changes but can follow directions with cues        Exercises     Shoulder Instructions       General Comments Pt is currently interested in SNF setting for post acute rehab reported to RN and SW    Pertinent Vitals/ Pain       Pain Assessment: Faces Faces Pain Scale: Hurts little more Pain Location: back Pain Descriptors / Indicators: Grimacing;Guarding Pain Intervention(s): Monitored during session;Repositioned;Limited activity within patient's tolerance  Home Living                                          Prior Functioning/Environment              Frequency  Min 2X/week        Progress Toward Goals  OT Goals(current goals can now be found in the care plan section)  Progress towards OT goals: Progressing toward goals  Acute Rehab OT Goals Patient Stated Goal: to hurt less, go home OT Goal Formulation: With patient/family Time For Goal Achievement: 07/04/21 Potential to Achieve Goals: Good ADL Goals Pt Will Perform Grooming: with supervision;with set-up;standing;with caregiver independent in assisting Pt Will Perform Upper Body Bathing: with set-up;with modified independence;sitting;with caregiver independent in assisting Pt Will Perform Lower Body Bathing: with min assist;with adaptive equipment;with caregiver independent in assisting Pt Will Perform Upper Body Dressing: with set-up;with modified independence;sitting;with caregiver independent in assisting Pt Will Perform Lower Body Dressing: with min assist;with adaptive equipment;with caregiver independent in assisting Pt Will Transfer to Toilet: with min guard assist;with supervision;ambulating Pt Will Perform Toileting - Clothing Manipulation and hygiene: with min guard assist;with supervision;sit to/from stand;with caregiver independent in  assisting Pt Will Perform Tub/Shower Transfer: with min assist;with min guard assist;ambulating  Plan Discharge plan needs to be updated;Frequency remains appropriate    Co-evaluation                 AM-PAC OT "6 Clicks" Daily Activity     Outcome Measure   Help from another person eating meals?: None Help from another person taking care of personal grooming?: A Little Help from another person toileting, which includes using toliet, bedpan, or urinal?: A Lot Help from another person bathing (including washing, rinsing, drying)?: A Lot Help from another person to put on and taking off regular upper body clothing?: None Help from another person to put on and taking off regular lower body clothing?: A Little 6 Click Score: 18    End of Session Equipment Utilized During Treatment: Gait belt;Rolling walker;Other (comment) (3 in 1)  OT Visit Diagnosis: Unsteadiness on feet (R26.81);Other abnormalities of gait and mobility (R26.89);Muscle weakness (generalized) (M62.81);Pain Pain - part of body:  (back)   Activity Tolerance Patient limited by pain;Patient limited by  fatigue   Patient Left with call bell/phone within reach;in chair;with chair alarm set   Nurse Communication          Time: [phone] OT Time Calculation (min): 18 min  Charges: OT General Charges $OT Visit: 1 Visit OT Treatments $Self Care/Home Management : 8-22 mins  Minus Breeding, MSOT, OTR/L  Supplemental Rehabilitation Services  [phone]    Marius Ditch 06/22/2021, 3:23 PM

## 2021-06-22 NOTE — NC FL2 (Addendum)
[place] LEVEL OF CARE SCREENING TOOL     IDENTIFICATION  Patient Name: Nathan Fernandez Birthdate: [birth year] Sex: male Admission Date (Current Location): 06/19/2021  [facility] and [place] Number:  Herbalist and Address:  [facility] [place] [address] [place], [place] [address]      Provider Number: [id number]  Attending Physician Name and Address:  Kary Kos, MD  Relative Name and Phone Number:  Bartow, Zylstra (Spouse)   [phone]    Current Level of Care: Hospital Recommended Level of Care: [place] Prior Approval Number:    Date Approved/Denied:   PASRR Number:  [id number]   Discharge Plan: SNF    Current Diagnoses: Patient Active Problem List   Diagnosis Date Noted   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/19/2021   Non-ST elevation MI (NSTEMI) ([place]) 02/12/2021   NSTEMI (non-ST elevated myocardial infarction) ([place]) 02/12/2021   COVID-19 virus infection 02/12/2021   Long term (current) use of anticoagulants 05/27/2020   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   Hemoptysis 07/10/2019   Abnormal cardiovascular stress test    Tremor 09/02/2018   Advance care planning 09/02/2018   Cold intolerance 09/02/2018   Dry mouth 03/12/2018   Shoulder pain 01/05/2018   Hypoxia 12/29/2017   Wrist pain 12/29/2017   Lower back pain 02/18/2017   Lower urinary tract symptoms (LUTS) 02/11/2016   Bilateral knee pain 05/25/2015   Benign paroxysmal positional vertigo 02/18/2015   Abdominal wall symptom 02/18/2015   Gout 05/04/2014   Healthcare maintenance 04/18/2014   COPD (chronic obstructive pulmonary disease) ([place]) 03/25/2013   CAD (coronary artery disease) 03/03/2013   Other malaise and fatigue 02/22/2013   Chest wall pain 05/31/2012   Heartburn 05/07/2011   Depression 11/28/2010   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   NICOTINE ADDICTION 06/04/2010    Diabetes mellitus ([place]) 03/18/2010   SCIATICA, 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   HYPERCHOLESTEROLEMIA 10/19/2007   ERECTILE DYSFUNCTION 10/19/2007   HYPERTENSION, BENIGN 10/19/2007   ROSACEA 10/19/2007   Osteoarthritis 10/19/2007   Insomnia 10/19/2007    Orientation RESPIRATION BLADDER Height & Weight     Self, Time, Situation, Place  Normal Continent Weight: 175 lb (79.4 kg) Height:  5\' 9"  (175.3 cm)  BEHAVIORAL SYMPTOMS/MOOD NEUROLOGICAL BOWEL NUTRITION STATUS      Continent Diet (See DC Summary)  AMBULATORY STATUS COMMUNICATION OF NEEDS Skin   Limited Assist Verbally Surgical wounds (Incision/Skin Closed)                       Personal Care Assistance Level of Assistance  Bathing, Feeding Bathing Assistance: Limited assistance Feeding assistance: Independent Dressing Assistance: Limited assistance     Functional Limitations Info  Sight, Hearing, Speech Sight Info: Adequate Hearing Info: Adequate Speech Info: Adequate    SPECIAL CARE FACTORS FREQUENCY  PT (By licensed PT), OT (By licensed OT)     PT Frequency: 5x a week OT Frequency: 5  week            Contractures Contractures Info: Not present    Additional Factors Info  Code Status, Allergies Code Status Info: Full Allergies Info: Ace Inhibitors   Angiotensin Receptor Blockers   Enalapril           Current Medications (06/22/2021):  This is the current hospital active medication list Current Facility-Administered Medications  Medication Dose Route Frequency Provider Last Rate Last Admin   acetaminophen (TYLENOL) tablet  650 mg  650 mg Oral Q4H PRN Kary Kos, MD       Or   acetaminophen (TYLENOL) suppository 650 mg  650 mg Rectal Q4H PRN Kary Kos, MD       acetaminophen (TYLENOL) tablet 500 mg  500 mg Oral BID Kary Kos, MD   500 mg at 06/22/21 1010   albuterol (PROVENTIL) (2.5 MG/3ML) 0.083% nebulizer solution 3 mL  3 mL Inhalation Q6H PRN Kary Kos, MD       alum & mag hydroxide-simeth (MAALOX/MYLANTA)  200-200-20 MG/5ML suspension 30 mL  30 mL Oral Q6H PRN Kary Kos, MD       ascorbic acid (VITAMIN C) tablet 1,000 mg  1,000 mg Oral Daily Kary Kos, MD   1,000 mg at 06/22/21 1010   aspirin EC tablet 81 mg  81 mg Oral Daily Kary Kos, MD   81 mg at 06/22/21 1014   buPROPion (WELLBUTRIN SR) 12 hr tablet 150 mg  150 mg Oral QHS Kary Kos, MD   150 mg at 06/21/21 2026   carvedilol (COREG) tablet 6.25 mg  6.25 mg Oral BID WC Kary Kos, MD   6.25 mg at 06/22/21 1013   cholecalciferol (VITAMIN D3) tablet 5,000 Units  5,000 Units Oral Daily Kary Kos, MD   5,000 Units at 06/22/21 1011   dapagliflozin propanediol (FARXIGA) tablet 5 mg  5 mg Oral QAC breakfast Kary Kos, MD   5 mg at 06/22/21 1013   dorzolamide-timolol (COSOPT) 22.3-6.8 MG/ML ophthalmic solution 1 drop  1 drop Both Eyes BID Kary Kos, MD   1 drop at 06/22/21 1015   fenofibrate tablet 54 mg  54 mg Oral Daily Kary Kos, MD   54 mg at 06/22/21 1012   gabapentin (NEURONTIN) capsule 100 mg  100 mg Oral BID Kary Kos, MD   100 mg at 06/22/21 1014   hydrALAZINE (APRESOLINE) tablet 10 mg  10 mg Oral Q12H Kary Kos, MD   10 mg at 06/22/21 1012   isosorbide mononitrate (IMDUR) 24 hr tablet 15 mg  15 mg Oral Daily Kary Kos, MD   15 mg at 06/22/21 1012   ketoconazole (NIZORAL) 2 % cream 1 application  1 application Topical Daily PRN Kary Kos, MD       menthol-cetylpyridinium (CEPACOL) lozenge 3 mg  1 lozenge Oral PRN Kary Kos, MD       Or   phenol (CHLORASEPTIC) mouth spray 1 spray  1 spray Mouth/Throat PRN Kary Kos, MD       nitroGLYCERIN (NITROSTAT) SL tablet 0.4 mg  0.4 mg Sublingual Q5 min PRN Kary Kos, MD       ondansetron [facility]) tablet 4 mg  4 mg Oral Q6H PRN Kary Kos, MD       Or   ondansetron [facility]) injection 4 mg  4 mg Intravenous Q6H PRN Kary Kos, MD       oxyCODONE (Oxy IR/ROXICODONE) immediate release tablet 5 mg  5 mg Oral Q6H PRN Kary Kos, MD   5 mg at 06/22/21 1011   pantoprazole (PROTONIX) EC tablet 40  mg  40 mg Oral Q supper Alvira Philips, RPH   40 mg at 06/21/21 1700   polyethylene glycol 0.4% and propylene glycol 0.3% (SYSTANE) ophthalmic gel  1 application Left Eye Daily PRN Kary Kos, MD       rosuvastatin (CRESTOR) tablet 20 mg  20 mg Oral Daily Kary Kos, MD   20 mg at 06/22/21 1013   traZODone (DESYREL)  tablet 200 mg  200 mg Oral QHS Kary Kos, MD   200 mg at 06/21/21 2027     Discharge Medications: Please see discharge summary for a list of discharge medications.  Relevant Imaging Results:  Relevant Lab Results:   Additional Information SSN: [id number]; [place] COVID-19 Vaccine 03/05/2020 , 02/13/2020  Reece Agar, LCSWA

## 2021-06-22 NOTE — Progress Notes (Signed)
Spoke with surgeon and conveyed patient's desire to go to SNF, as recommended by PT/OT, and wife.  See NO

## 2021-06-22 NOTE — Progress Notes (Signed)
  NEUROSURGERY PROGRESS NOTE   No issues overnight. Pt cont to c/o back pain, says legs are "doing good." Ambulating with rolling walker.  EXAM:  BP 103/70 (BP Location: Right Arm)   Pulse 87   Temp 98.5 F (36.9 C) (Oral)   Resp 20   Ht 5\' 9"  (1.753 m)   Wt 79.4 kg   SpO2 91%   BMI 25.84 kg/m   Awake, alert, oriented  Speech fluent CN grossly intact  5/5 BUE/BLE   IMPRESSION:  80 y.o. male POD# 4 s/p lumbar laminectomy, remains stable  PLAN: - Pt prefers d/c home today rather than SNF placement. Will d/c with home health PT/OT   Consuella Lose, MD [facility]

## 2021-06-22 NOTE — Progress Notes (Signed)
Physical Therapy Treatment Patient Details Name: Nathan Fernandez MRN: [id number] DOB: Dec 28, 1940 Today's Date: 06/22/2021    History of Present Illness 80 yo male with onset of BLE and back pain with failed conservative tx was taken to OR for decompressive laminectomies from L2-3, L3-4, and L4-5 with partial medial facetectomies and foraminotomies of L3, L4 and L5 nerve roots.  PMHx:  bells palsy, COPD, DM, CAD, splenic infarct, MI, OA    PT Comments    Patient progressing with mobility.  Patient with some mild balance deficits and pain limiting mobility.  Patient currently for SNF which is appropriate, but may progress to Home with [facility] is he continues to improve.  Will benefit from continued PT to progress mobility and independence.      Follow Up Recommendations  Supervision for mobility/OOB;SNF     Equipment Recommendations  Rolling walker with 5" wheels    Recommendations for Other Services       Precautions / Restrictions Precautions Precautions: Fall;Back Precaution Booklet Issued: Yes (comment) Precaution Comments: reviewd back precautions, pt able to recall 3/3 spinal precautions. Required Braces or Orthoses:  (NO SPINAL BRACE) Splint/Cast: no back brace ordered by MD Restrictions Weight Bearing Restrictions: No    Mobility  Bed Mobility Overal bed mobility: Needs Assistance Bed Mobility: Rolling;Sidelying to Sit;Sit to Sidelying     Supine to sit: Min guard     General bed mobility comments: in recliner upon arrival    Transfers Overall transfer level: Needs assistance Equipment used: Rolling walker (2 wheeled) Transfers: Sit to/from Stand Sit to Stand: Min assist         General transfer comment: min assist mostly for safety and cueing, decreased physical assistance to power up this session.  Ambulation/Gait Ambulation/Gait assistance: Min guard Gait Distance (Feet): 100 Feet Assistive device: Rolling walker (2 wheeled) Gait Pattern/deviations:  Step-through pattern;Decreased stride length Gait velocity: reduced Gait velocity interpretation: <1.31 ft/sec, indicative of household ambulator General Gait Details: cueing for upright posture   Stairs             Wheelchair Mobility    Modified Rankin (Stroke Patients Only)       Balance Overall balance assessment: Needs assistance Sitting-balance support: No upper extremity supported;Feet supported Sitting balance-Leahy Scale: Good     Standing balance support: Bilateral upper extremity supported;During functional activity Standing balance-Leahy Scale: Poor Standing balance comment: reliant on RW                            Cognition Arousal/Alertness: Awake/alert Behavior During Therapy: WFL for tasks assessed/performed Overall Cognitive Status: Within Functional Limits for tasks assessed                                 General Comments: mild cognitive changes but can follow directions with cues      Exercises      General Comments General comments (skin integrity, edema, etc.): Pt is currently interested in SNF setting for post acute rehab report RN and SW      Pertinent Vitals/Pain Pain Assessment: 0-10 Pain Score: 3  Faces Pain Scale: Hurts little more Pain Location: back Pain Descriptors / Indicators: Discomfort;Operative site guarding Pain Intervention(s): Limited activity within patient's tolerance;Monitored during session    Home Living  Prior Function            PT Goals (current goals can now be found in the care plan section) Acute Rehab PT Goals Patient Stated Goal: to hurt less, go home Progress towards PT goals: Progressing toward goals    Frequency    Min 5X/week      PT Plan Current plan remains appropriate    Co-evaluation              AM-PAC PT "6 Clicks" Mobility   Outcome Measure  Help needed turning from your back to your side while in a flat bed  without using bedrails?: A Little Help needed moving from lying on your back to sitting on the side of a flat bed without using bedrails?: A Little Help needed moving to and from a bed to a chair (including a wheelchair)?: A Little Help needed standing up from a chair using your arms (e.g., wheelchair or bedside chair)?: A Little Help needed to walk in hospital room?: A Little Help needed climbing 3-5 steps with a railing? : A Lot 6 Click Score: 17    End of Session Equipment Utilized During Treatment: Gait belt Activity Tolerance: Patient tolerated treatment well Patient left: in chair;with call bell/phone within reach;with chair alarm set   PT Visit Diagnosis: Unsteadiness on feet (R26.81);Muscle weakness (generalized) (M62.81);Other abnormalities of gait and mobility (R26.89)     Time: 8403-7543 PT Time Calculation (min) (ACUTE ONLY): 15 min  Charges:  $Gait Training: 8-22 mins                     06/22/2021 Margie, PT Acute Rehabilitation Services Pager:  [phone] Office:  [phone]    Shanna Cisco 06/22/2021, 3:56 PM

## 2021-06-22 NOTE — Discharge Summary (Addendum)
Physician Discharge Summary  Patient ID: DIONTRE HARPS MRN: [id number] DOB/AGE: [birth year] 80 y.o.  Admit date: 06/19/2021 Discharge date: 06/26/2021  Admission Diagnoses:  Spinal stenosis, lumbar  Discharge Diagnoses:  Same Active Problems:   Spinal stenosis of lumbar region   Discharged Condition: Stable  Hospital Course:  Pacific DUCRE is a 80 y.o. male admitted after lumbar laminectomy. He had a largely uncomplicated hospital course, was seen by PT/OT. While SNF was recommended, pt preferred d/c home. He was ambulating with a rolling walker, voiding normally, with pain controlled with oral medication.  Treatments: Surgery - Lumbar laminectomy, L2-L5  Discharge Exam: Blood pressure (!) 135/93, pulse 79, temperature 98.6 F (37 C), temperature source Oral, resp. rate 16, height 5\' 9"  (1.753 m), weight 79.4 kg, SpO2 93 %. Awake, alert, oriented Speech fluent, appropriate CN grossly intact 5/5 BUE/BLE Wound c/d/i  Disposition: Discharge disposition: 03-Skilled Nursing Facility      Discharge Instructions     Call MD for:  difficulty breathing, headache or visual disturbances   Complete by: As directed    Call MD for:  hives   Complete by: As directed    Call MD for:  persistant nausea and vomiting   Complete by: As directed    Call MD for:  redness, tenderness, or signs of infection (pain, swelling, redness, odor or green/yellow discharge around incision site)   Complete by: As directed    Call MD for:  redness, tenderness, or signs of infection (pain, swelling, redness, odor or green/yellow discharge around incision site)   Complete by: As directed    Call MD for:  severe uncontrolled pain   Complete by: As directed    Call MD for:  temperature >100.4   Complete by: As directed    Call MD for:  temperature >100.4   Complete by: As directed    Diet - low sodium heart healthy   Complete by: As directed    Diet - low sodium heart healthy   Complete by: As  directed    Discharge instructions   Complete by: As directed    Walk at home as much as possible, at least 4 times / day   Increase activity slowly   Complete by: As directed    Increase activity slowly   Complete by: As directed    Lifting restrictions   Complete by: As directed    No lifting > 10 lbs   May shower / Bathe   Complete by: As directed    48 hours after surgery   May walk up steps   Complete by: As directed    No dressing needed   Complete by: As directed    Other Restrictions   Complete by: As directed    No bending/twisting at waist   Remove dressing in 24 hours   Complete by: As directed       Allergies as of 06/26/2021       Reactions   Ace Inhibitors Swelling   Swelling of the tongue   Angiotensin Receptor Blockers Swelling   Tongue swelling   Enalapril Swelling   Tongue swelling        Medication List     STOP taking these medications    benzonatate 200 MG capsule Commonly known as: TESSALON   cyclobenzaprine 5 MG tablet Commonly known as: FLEXERIL   ELDERBERRY PO       TAKE these medications    acetaminophen 325 MG tablet Commonly known as:  Tylenol Take 1.5 tablets (487.5 mg total) by mouth 2 (two) times daily. What changed:  how much to take when to take this reasons to take this   albuterol 108 (90 Base) MCG/ACT inhaler Commonly known as: VENTOLIN HFA Inhale 1-2 puffs into the lungs every 6 (six) hours as needed (COPD).   aspirin 81 MG EC tablet Take 81 mg by mouth daily.   buPROPion 150 MG 12 hr tablet Commonly known as: WELLBUTRIN SR Take 1 tablet (150 mg total) by mouth at bedtime.   carvedilol 6.25 MG tablet Commonly known as: COREG Take 1 tablet (6.25 mg total) by mouth in the morning and at bedtime.   dapagliflozin propanediol 5 MG Tabs tablet Commonly known as: Farxiga Take 1 tablet (5 mg total) by mouth daily before breakfast.   dorzolamide-timolol 22.3-6.8 MG/ML ophthalmic solution Commonly known as:  COSOPT Place 1 drop into both eyes 2 (two) times daily.   fenofibrate 48 MG tablet Commonly known as: Tricor Take 1 tablet (48 mg total) by mouth daily.   gabapentin 100 MG capsule Commonly known as: NEURONTIN Take 1 capsule (100 mg total) by mouth 2 (two) times daily.   hydrALAZINE 10 MG tablet Commonly known as: APRESOLINE TAKE 1 TABLET (10 MG TOTAL) BY MOUTH EVERY 12 (TWELVE) HOURS.   isosorbide mononitrate 30 MG 24 hr tablet Commonly known as: IMDUR Take 0.5 tablets (15 mg total) by mouth daily.   ketoconazole 2 % cream Commonly known as: NIZORAL Apply 1 application topically daily as needed for irritation.   nitroGLYCERIN 0.4 MG SL tablet Commonly known as: NITROSTAT Place 1 tablet (0.4 mg total) under the tongue every 5 (five) minutes as needed for chest pain. Up to 3 doses   oxyCODONE 5 MG immediate release tablet Commonly known as: Oxy IR/ROXICODONE Take 1 tablet (5 mg total) by mouth every 6 (six) hours as needed for severe pain ((score 7 to 10)).   rivaroxaban 20 MG Tabs tablet Commonly known as: Xarelto Take 1 tablet (20 mg total) by mouth daily with supper.   rosuvastatin 20 MG tablet Commonly known as: Crestor Take 1 tablet (20 mg total) by mouth daily.   Systane 0.4-0.3 % Gel ophthalmic gel Generic drug: Polyethyl Glycol-Propyl Glycol Place 1 application into the left eye daily as needed (Dry eyes).   traZODone 100 MG tablet Commonly known as: DESYREL TAKE 2 TABLETS (200 MG TOTAL) BY MOUTH AT BEDTIME.   vitamin C 1000 MG tablet Take 1,000 mg by mouth daily.   Vitamin D 125 MCG (5000 UT) Caps Take 5,000 Units by mouth daily.               Durable Medical Equipment  (From admission, onward)           Start     Ordered   06/22/21 1436  For home use only DME 3 n 1  Once        06/22/21 1436   06/22/21 1424  For home use only DME Walker rolling  Once       Question Answer Comment  Walker: With 5 Inch Wheels   Patient needs a walker to  treat with the following condition Decreased functional mobility and endurance      06/22/21 1423   06/21/21 0751  Heart failure home health orders  (Heart failure home health orders / Face to face)  Once       Comments: Heart Failure Follow-up Care:  Verify follow-up appointments per Patient Discharge Instructions.  Confirm transportation arranged. Reconcile home medications with discharge medication list. Remove discontinued medications from use. Assist patient/caregiver to manage medications using pill box. Reinforce low sodium food selection Assessments: Vital signs and oxygen saturation at each visit. Assess home environment for safety concerns, caregiver support and availability of low-sodium foods. Consult Education officer, museum, PT/OT, Dietitian, and CNA based on assessments. Perform comprehensive cardiopulmonary assessment. Notify MD for any change in condition or weight gain of 3 pounds in one day or 5 pounds in one week with symptoms. Daily Weights and Symptom Monitoring: Ensure patient has access to scales. Teach patient/caregiver to weigh daily before breakfast and after voiding using same scale and record.    Teach patient/caregiver to track weight and symptoms and when to notify Provider. Activity: Develop individualized activity plan with patient/caregiver.   Question Answer Comment  Heart Failure Follow-up Care Or per Doctor (see comments)   Obtain the following labs Basic Metabolic Panel   Lab frequency Weekly   Fax lab results to [facility] at [phone]   Diet Low Sodium Heart Healthy   Fluid restrictions: See other comments      06/21/21 0752              Discharge Care Instructions  (From admission, onward)           Start     Ordered   06/22/21 0000  No dressing needed        06/22/21 1328            Follow-up Information     Kary Kos, MD. Call in 2 week(s).   Specialty: Neurosurgery Why: For wound re-check, Clinical Follow-up Contact  information: [address] [place] [address] [phone]         [facility], [place] Follow up.   Specialty: [place] Why: the office will call with to schedule home health visits Contact information: [place] [address] [phone]                 Signed: Ocie Cornfield [facility] 06/26/2021, 1:32 PM

## 2021-06-22 NOTE — TOC Initial Note (Signed)
Transition of Care [facility]) - Initial/Assessment Note    Patient Details  Name: Nathan Fernandez MRN: [id number] Date of Birth: 1941/12/06  Transition of Care [facility]) CM/SW Contact:    Bartholomew Crews, RN Phone Number: [phone] 06/22/2021, 3:20 PM  Clinical Narrative:                  Spoke with patient on his mobile device. PTA home with spouse. Discussed [place] arrangements for PT, OT with [facility]. Does not have DME at home, but is agreeable to 3N1 and RW through [employer]. Patient not wanting to go home first, but would prefer to go to [facility] preferrably, but is open to other facilities if needed. Stated that he would benefit from some rehab before returning home with his wife. Discussed SNF process and requested CSW to assist. TOC following for transition needs.   Expected Discharge Plan: Skilled Nursing Facility Barriers to Discharge: Continued Medical Work up   Patient Goals and CMS Choice Patient states their goals for this hospitalization and ongoing recovery are:: wants to go to [facility] preferrably first then return home with wife CMS Medicare.gov Compare Post Acute Care list provided to:: Patient Choice offered to / list presented to : Patient  Expected Discharge Plan and Services Expected Discharge Plan: [place] In-house Referral: Clinical Social Work Discharge Planning Services: CM Consult Post Acute Care Choice: [place], Durable Medical Equipment Living arrangements for the past 2 months: [place] Expected Discharge Date: 06/22/21               DME Arranged: Berta Minor DME Agency: [employer] Date DME Agency Contacted: 06/22/21 Time DME Agency Contacted: [phone] Representative spoke with at DME Agency: [place]: PT, OT [place] Agency: [place] Date [place]: 06/22/21 Time [place]: 1519 Representative spoke with at [place]: Eleanor  Prior Living  Arrangements/Services Living arrangements for the past 2 months: [place] with:: Self, Spouse Patient language and need for interpreter reviewed:: Yes Do you feel safe going back to the place where you live?: Yes      Need for Family Participation in Patient Care: Yes (Comment) Care giver support system in place?: Yes (comment)   Criminal Activity/Legal Involvement Pertinent to Current Situation/Hospitalization: No - Comment as needed  Activities of Daily Living Home Assistive Devices/Equipment: Eyeglasses ADL Screening (condition at time of admission) Patient's cognitive ability adequate to safely complete daily activities?: Yes Is the patient deaf or have difficulty hearing?: Yes Does the patient have difficulty seeing, even when wearing glasses/contacts?: No Does the patient have difficulty concentrating, remembering, or making decisions?: No Patient able to express need for assistance with ADLs?: Yes Does the patient have difficulty dressing or bathing?: No Independently performs ADLs?: Yes (appropriate for developmental age) Does the patient have difficulty walking or climbing stairs?: Yes Weakness of Legs: Left Weakness of Arms/Hands: None  Permission Sought/Granted                  Emotional Assessment Appearance:: Appears stated age Attitude/Demeanor/Rapport: Engaged Affect (typically observed): Accepting Orientation: : Oriented to Self, Oriented to  Time, Oriented to Place, Oriented to Situation Alcohol / Substance Use: Not Applicable Psych Involvement: No (comment)  Admission diagnosis:  Spinal stenosis of lumbar region [M48.061] Patient Active Problem List   Diagnosis Date Noted   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/19/2021   Non-ST elevation MI (NSTEMI) ([place]) 02/12/2021   NSTEMI (non-ST elevated myocardial infarction) (  [place]) 02/12/2021   COVID-19 virus infection 02/12/2021   Long term (current) use of anticoagulants 05/27/2020   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   Hemoptysis 07/10/2019   Abnormal cardiovascular stress test    Tremor 09/02/2018   Advance care planning 09/02/2018   Cold intolerance 09/02/2018   Dry mouth 03/12/2018   Shoulder pain 01/05/2018   Hypoxia 12/29/2017   Wrist pain 12/29/2017   Lower back pain 02/18/2017   Lower urinary tract symptoms (LUTS) 02/11/2016   Bilateral knee pain 05/25/2015   Benign paroxysmal positional vertigo 02/18/2015   Abdominal wall symptom 02/18/2015   Gout 05/04/2014   Healthcare maintenance 04/18/2014   COPD (chronic obstructive pulmonary disease) ([place]) 03/25/2013   CAD (coronary artery disease) 03/03/2013   Other malaise and fatigue 02/22/2013   Chest wall pain 05/31/2012   Heartburn 05/07/2011   Depression 11/28/2010   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 [place] [address] [place] [address] Phone: [phone] Fax: [phone]  [id number] [place], [place] [address] [place] [address] Phone: [phone] Fax: [phone]     Social Determinants of Health (SDOH) Interventions    Readmission Risk Interventions No flowsheet data found.

## 2021-06-22 NOTE — TOC Initial Note (Signed)
Transition of Care [facility]) - Initial/Assessment Note    Patient Details  Name: Nathan Fernandez MRN: [id number] Date of Birth: 12-12-1941  Transition of Care [facility]) CM/SW Contact:    Tresa Endo Phone Number: 06/22/2021, 3:24 PM  Clinical Narrative:                 1522: CSW spoke with pt about final DC plan, he said after speaking with his wife he thinks he now needs SNF and would like to go to [facility] bc it is closest to his home. CSW agreed to fax pt out and explained that if [facility] does not have availability we can go ahead and fax out to other [place] locations for a back up. Pt agreed and asked CSW to follow up when he is able to DC.  Expected Discharge Plan: Skilled Nursing Facility Barriers to Discharge: Continued Medical Work up   Patient Goals and CMS Choice Patient states their goals for this hospitalization and ongoing recovery are:: wants to go to [facility] preferrably first then return home with wife CMS Medicare.gov Compare Post Acute Care list provided to:: Patient Choice offered to / list presented to : Patient  Expected Discharge Plan and Services Expected Discharge Plan: [place] In-house Referral: Clinical Social Work Discharge Planning Services: CM Consult Post Acute Care Choice: [place], Durable Medical Equipment Living arrangements for the past 2 months: [place] Expected Discharge Date: 06/22/21               DME Arranged: Berta Minor DME Agency: [employer] Date DME Agency Contacted: 06/22/21 Time DME Agency Contacted: [phone] Representative spoke with at DME Agency: [place]: PT, OT [place] Agency: [place] Date [place]: 06/22/21 Time [place]: 1519 Representative spoke with at [place]: [place]  Prior Living Arrangements/Services Living arrangements for the past 2 months: [place] with:: Self, Spouse Patient language and need for  interpreter reviewed:: Yes Do you feel safe going back to the place where you live?: Yes      Need for Family Participation in Patient Care: Yes (Comment) Care giver support system in place?: Yes (comment)   Criminal Activity/Legal Involvement Pertinent to Current Situation/Hospitalization: No - Comment as needed  Activities of Daily Living Home Assistive Devices/Equipment: Eyeglasses ADL Screening (condition at time of admission) Patient's cognitive ability adequate to safely complete daily activities?: Yes Is the patient deaf or have difficulty hearing?: Yes Does the patient have difficulty seeing, even when wearing glasses/contacts?: No Does the patient have difficulty concentrating, remembering, or making decisions?: No Patient able to express need for assistance with ADLs?: Yes Does the patient have difficulty dressing or bathing?: No Independently performs ADLs?: Yes (appropriate for developmental age) Does the patient have difficulty walking or climbing stairs?: Yes Weakness of Legs: Left Weakness of Arms/Hands: None  Permission Sought/Granted                  Emotional Assessment Appearance:: Appears stated age Attitude/Demeanor/Rapport: Engaged Affect (typically observed): Accepting Orientation: : Oriented to Self, Oriented to  Time, Oriented to Place, Oriented to Situation Alcohol / Substance Use: Not Applicable Psych Involvement: No (comment)  Admission diagnosis:  Spinal stenosis of lumbar region [M48.061] Patient Active Problem List   Diagnosis Date Noted   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/19/2021   Non-ST elevation MI (NSTEMI) ([place]) 02/12/2021   NSTEMI (non-ST elevated myocardial infarction) ([place]) 02/12/2021   COVID-19 virus  infection 02/12/2021   Long term (current) use of anticoagulants 05/27/2020   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   Embolism of splenic artery ([employer]) 04/04/2020   Hemoptysis 07/10/2019   Abnormal cardiovascular stress test    Tremor  09/02/2018   Advance care planning 09/02/2018   Cold intolerance 09/02/2018   Dry mouth 03/12/2018   Shoulder pain 01/05/2018   Hypoxia 12/29/2017   Wrist pain 12/29/2017   Lower back pain 02/18/2017   Lower urinary tract symptoms (LUTS) 02/11/2016   Bilateral knee pain 05/25/2015   Benign paroxysmal positional vertigo 02/18/2015   Abdominal wall symptom 02/18/2015   Gout 05/04/2014   Healthcare maintenance 04/18/2014   COPD (chronic obstructive pulmonary disease) ([place]) 03/25/2013   CAD (coronary artery disease) 03/03/2013   Other malaise and fatigue 02/22/2013   Chest wall pain 05/31/2012   Heartburn 05/07/2011   Depression 11/28/2010   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 [place] [address] Phone: [phone] Fax: [phone]     Social Determinants of Health (SDOH) Interventions    Readmission Risk Interventions No flowsheet data found.

## 2021-06-23 DIAGNOSIS — Z85828 Personal history of other malignant neoplasm of skin: Secondary | ICD-10-CM | POA: Diagnosis not present

## 2021-06-23 DIAGNOSIS — M48062 Spinal stenosis, lumbar region with neurogenic claudication: Secondary | ICD-10-CM | POA: Diagnosis not present

## 2021-06-23 DIAGNOSIS — I10 Essential (primary) hypertension: Secondary | ICD-10-CM | POA: Diagnosis not present

## 2021-06-23 DIAGNOSIS — Z79899 Other long term (current) drug therapy: Secondary | ICD-10-CM | POA: Diagnosis not present

## 2021-06-23 DIAGNOSIS — Z7984 Long term (current) use of oral hypoglycemic drugs: Secondary | ICD-10-CM | POA: Diagnosis not present

## 2021-06-23 DIAGNOSIS — J449 Chronic obstructive pulmonary disease, unspecified: Secondary | ICD-10-CM | POA: Diagnosis not present

## 2021-06-23 DIAGNOSIS — E119 Type 2 diabetes mellitus without complications: Secondary | ICD-10-CM | POA: Diagnosis not present

## 2021-06-23 DIAGNOSIS — Z87891 Personal history of nicotine dependence: Secondary | ICD-10-CM | POA: Diagnosis not present

## 2021-06-23 DIAGNOSIS — I2510 Atherosclerotic heart disease of native coronary artery without angina pectoris: Secondary | ICD-10-CM | POA: Diagnosis not present

## 2021-06-23 DIAGNOSIS — M5416 Radiculopathy, lumbar region: Secondary | ICD-10-CM | POA: Diagnosis not present

## 2021-06-23 DIAGNOSIS — Z7982 Long term (current) use of aspirin: Secondary | ICD-10-CM | POA: Diagnosis not present

## 2021-06-23 DIAGNOSIS — Z20822 Contact with and (suspected) exposure to covid-19: Secondary | ICD-10-CM | POA: Diagnosis not present

## 2021-06-23 DIAGNOSIS — Z7901 Long term (current) use of anticoagulants: Secondary | ICD-10-CM | POA: Diagnosis not present

## 2021-06-23 MED ORDER — ENOXAPARIN SODIUM 40 MG/0.4ML IJ SOSY
40.0000 mg | PREFILLED_SYRINGE | INTRAMUSCULAR | Status: DC
  Administered 2021-06-23 – 2021-06-26 (×4): 40 mg via SUBCUTANEOUS
  Filled 2021-06-23 (×4): qty 0.4

## 2021-06-23 NOTE — Progress Notes (Signed)
Physical Therapy Treatment Patient Details Name: Nathan Fernandez MRN: [id number] DOB: [birth year] Today's Date: 06/23/2021    History of Present Illness Pt is an 80 y/o male with onset of bilateral LE and back pain with failed conservative treatment was taken to OR for decompressive laminectomies from L2-3, L3-4, and L4-5 with partial medial facetectomies and foraminotomies of L3, L4 and L5 nerve roots.  PMHx:  bells palsy, COPD, DM, CAD, splenic infarct, MI, OA    PT Comments    Pt continuing to make excellent progress overall with functional mobility. He ambulated in the hallway with use of RW and min guard for safety without any balance deficits or LOB noted. Additionally, he participated in stair training again this session with min guard and cueing. PT updated d/c recommendations to reflect pt's progress. However, pt continues to wish to pursue SNF placement. PT will continue to f/u with pt acutely to progress mobility as tolerated.     Follow Up Recommendations  Home health PT;Supervision for mobility/OOB     Equipment Recommendations  Rolling walker with 5" wheels    Recommendations for Other Services       Precautions / Restrictions Precautions Precautions: Fall;Back Precaution Comments: reviewed back precautions with pt in regards to functional mobility Restrictions Weight Bearing Restrictions: No    Mobility  Bed Mobility               General bed mobility comments: pt seated upright at EOB upon PT arrival    Transfers Overall transfer level: Needs assistance Equipment used: Rolling walker (2 wheeled) Transfers: Sit to/from Stand Sit to Stand: Min guard         General transfer comment: cueing for safe hand placement and guarding for overall safety and stability with transitional movement  Ambulation/Gait Ambulation/Gait assistance: Min guard Gait Distance (Feet): 400 Feet Assistive device: Rolling walker (2 wheeled) Gait Pattern/deviations: Step-through  pattern;Decreased stride length Gait velocity: reduced   General Gait Details: cueing for relaxed shoulder position and upright posture; mild instability but no overt LOB or need for physical assistance   Stairs Stairs: Yes Stairs assistance: Min guard Stair Management: Two rails;Forwards;Step to pattern Number of Stairs: 2 General stair comments: guarding for safety, no instability or LOB   Wheelchair Mobility    Modified Rankin (Stroke Patients Only)       Balance Overall balance assessment: Needs assistance Sitting-balance support: No upper extremity supported;Feet supported Sitting balance-Leahy Scale: Good     Standing balance support: Bilateral upper extremity supported;During functional activity Standing balance-Leahy Scale: Poor                              Cognition Arousal/Alertness: Awake/alert Behavior During Therapy: WFL for tasks assessed/performed Overall Cognitive Status: Within Functional Limits for tasks assessed                                        Exercises      General Comments        Pertinent Vitals/Pain Pain Assessment: Faces Faces Pain Scale: Hurts a little bit Pain Location: back Pain Descriptors / Indicators: Aching Pain Intervention(s): Monitored during session    Home Living                      Prior Function  PT Goals (current goals can now be found in the care plan section) Acute Rehab PT Goals PT Goal Formulation: With patient Time For Goal Achievement: 06/28/21 Potential to Achieve Goals: Good Progress towards PT goals: Progressing toward goals    Frequency    Min 5X/week      PT Plan Discharge plan needs to be updated    Co-evaluation              AM-PAC PT "6 Clicks" Mobility   Outcome Measure  Help needed turning from your back to your side while in a flat bed without using bedrails?: None Help needed moving from lying on your back to sitting on  the side of a flat bed without using bedrails?: None Help needed moving to and from a bed to a chair (including a wheelchair)?: None Help needed standing up from a chair using your arms (e.g., wheelchair or bedside chair)?: None Help needed to walk in hospital room?: None Help needed climbing 3-5 steps with a railing? : A Little 6 Click Score: 23    End of Session Equipment Utilized During Treatment: Gait belt Activity Tolerance: Patient tolerated treatment well Patient left: in bed;with call bell/phone within reach;Other (comment) (seated upright at EOB) Nurse Communication: Mobility status PT Visit Diagnosis: Unsteadiness on feet (R26.81);Muscle weakness (generalized) (M62.81);Other abnormalities of gait and mobility (R26.89) Pain - part of body:  (back)     Time: 5396-7289 PT Time Calculation (min) (ACUTE ONLY): 18 min  Charges:  $Gait Training: 8-22 mins                     Anastasio Champion, DPT  Acute Rehabilitation Services Office [place] 06/23/2021, 12:41 PM

## 2021-06-23 NOTE — Progress Notes (Signed)
PT Cancellation Note  Patient Details Name: Nathan Fernandez MRN: [id number] DOB: [birth year]   Cancelled Treatment:    Reason Eval/Treat Not Completed: Patient declined, no reason specified. Pt reporting having just returned to bed from walking to the bathroom. Stated that he had "been up twice in the past 45 minutes". PT will return later today for session.    [place] 06/23/2021, 8:45 AM

## 2021-06-23 NOTE — Progress Notes (Signed)
Occupational Therapy Treatment Patient Details Name: Nathan Fernandez MRN: [id number] DOB: September 06, 1941 Today's Date: 06/23/2021    History of present illness Pt is an 80 y/o male with onset of bilateral LE and back pain with failed conservative treatment was taken to OR for decompressive laminectomies from L2-3, L3-4, and L4-5 with partial medial facetectomies and foraminotomies of L3, L4 and L5 nerve roots.  PMHx:  bells palsy, COPD, DM, CAD, splenic infarct, MI, OA   OT comments  Pt is progressing well towards his acute OT goals. Pt is min guard for all mobility this session with vc for safe hand placement and log rolling. Pt recalled 1/3 back precautions, increased time reviewing precautions and demonstrating compensatory techniques for ADL to maintain back precautions. Pt is requesting to have a short SNF stay to progress his rehab, strength and indep prior to d/c home, his wife reports that she will be working in the afternoon in the coming weeks. Pt continues to benefit from continued OT acutely to progress function in ADLs and mobility. D/c recommendation remains appropriate.    Follow Up Recommendations  Supervision/Assistance - 24 hour;SNF    Equipment Recommendations  3 in 1 bedside commode;Other (comment);Tub/shower seat       Precautions / Restrictions Precautions Precautions: Fall;Back Precaution Comments: pt recalled 1/3 bacl precautions - verbally reviewed & demonstrated compensatory techniques       Mobility Bed Mobility Overal bed mobility: Needs Assistance Bed Mobility: Rolling;Sidelying to Sit;Sit to Sidelying Rolling: Min guard Sidelying to sit: Min guard Supine to sit: Min guard     General bed mobility comments: vc for log rolling    Transfers Overall transfer level: Needs assistance Equipment used: Rolling walker (2 wheeled) Transfers: Sit to/from Stand Sit to Stand: Min guard         General transfer comment: cueing for safe hand placement, min guard  for safety. no apparent LOB    Balance Overall balance assessment: Needs assistance Sitting-balance support: No upper extremity supported;Feet supported Sitting balance-Leahy Scale: Good     Standing balance support: Bilateral upper extremity supported;During functional activity Standing balance-Leahy Scale: Poor                             ADL either performed or assessed with clinical judgement   ADL Overall ADL's : Needs assistance/impaired Eating/Feeding: Independent;Sitting                   Lower Body Dressing: Supervision/safety;Sit to/from stand Lower Body Dressing Details (indicate cue type and reason): pt able to bring bilat feet up to complete lower body dressing while maintaining back precautions             Functional mobility during ADLs: Min guard;Rolling walker General ADL Comments: reviewed all compensatory techniques for ADLs to maintain back precautions      Cognition Arousal/Alertness: Awake/alert Behavior During Therapy: WFL for tasks assessed/performed                    General Comments Wife present for session & supportive    Pertinent Vitals/ Pain       Pain Assessment: Faces Faces Pain Scale: Hurts a little bit Pain Location: back Pain Descriptors / Indicators: Aching Pain Intervention(s): Monitored during session  Frequency  Min 2X/week        Progress Toward Goals  OT Goals(current goals can now be found in the care plan section)  Progress towards OT  goals: Progressing toward goals  Acute Rehab OT Goals Patient Stated Goal: go to rehab to get stronger, then go home OT Goal Formulation: With patient/family Time For Goal Achievement: 07/04/21 Potential to Achieve Goals: Good ADL Goals Pt Will Perform Grooming: with supervision;with set-up;standing;with caregiver independent in assisting Pt Will Perform Upper Body Bathing: with set-up;with modified independence;sitting;with caregiver independent in  assisting Pt Will Perform Lower Body Bathing: with min assist;with adaptive equipment;with caregiver independent in assisting Pt Will Perform Upper Body Dressing: with set-up;with modified independence;sitting;with caregiver independent in assisting Pt Will Perform Lower Body Dressing: with min assist;with adaptive equipment;with caregiver independent in assisting Pt Will Transfer to Toilet: with min guard assist;with supervision;ambulating Pt Will Perform Toileting - Clothing Manipulation and hygiene: with min guard assist;with supervision;sit to/from stand;with caregiver independent in assisting Pt Will Perform Tub/Shower Transfer: with min assist;with min guard assist;ambulating  Plan Discharge plan needs to be updated;Frequency remains appropriate       AM-PAC OT "6 Clicks" Daily Activity     Outcome Measure   Help from another person eating meals?: None Help from another person taking care of personal grooming?: A Little Help from another person toileting, which includes using toliet, bedpan, or urinal?: A Little Help from another person bathing (including washing, rinsing, drying)?: A Lot Help from another person to put on and taking off regular upper body clothing?: None Help from another person to put on and taking off regular lower body clothing?: A Little 6 Click Score: 19    End of Session Equipment Utilized During Treatment: Rolling walker  OT Visit Diagnosis: Unsteadiness on feet (R26.81);Other abnormalities of gait and mobility (R26.89);Muscle weakness (generalized) (M62.81);Pain   Activity Tolerance Patient tolerated treatment well   Patient Left in bed;with call bell/phone within reach;with family/visitor present   Nurse Communication Mobility status        Time: 8469-6295 OT Time Calculation (min): 19 min  Charges: OT General Charges $OT Visit: 1 Visit OT Treatments $Self Care/Home Management : 8-22 mins     Tuana Hoheisel A Cerena Baine 06/23/2021, 5:03 PM

## 2021-06-23 NOTE — Progress Notes (Signed)
Subjective: Patient reports pain well-controlled  Objective: Vital signs in last 24 hours: Temp:  [98.2 F (36.8 C)-98.7 F (37.1 C)] 98.4 F (36.9 C) (07/03 0838) Pulse Rate:  [76-88] 88 (07/03 0838) Resp:  [16-20] 18 (07/03 0838) BP: (103-155)/(70-96) 155/96 (07/03 0838) SpO2:  [90 %-93 %] 93 % (07/03 0838)  Intake/Output from previous day: 07/02 0701 - 07/03 0700 In: 1280 [P.O.:1280] Out: -  Intake/Output this shift: No intake/output data recorded.  Awake, alert, oriented Speech fluent CN grossly intact 5/5 BUE/BLE  Lab Results: No results for input(s): WBC, HGB, HCT, PLT in the last 72 hours. BMET No results for input(s): NA, K, CL, CO2, GLUCOSE, BUN, CREATININE, CALCIUM in the last 72 hours.  Studies/Results: No results found.  Assessment/Plan: 80 yo M s/p lumbar decompression, preferring d/c to SNF now - awaiting SNF placement - DVT ppx   Vallarie Mare 06/23/2021, 11:36 AM

## 2021-06-24 DIAGNOSIS — I10 Essential (primary) hypertension: Secondary | ICD-10-CM | POA: Diagnosis not present

## 2021-06-24 DIAGNOSIS — M5416 Radiculopathy, lumbar region: Secondary | ICD-10-CM | POA: Diagnosis not present

## 2021-06-24 DIAGNOSIS — Z79899 Other long term (current) drug therapy: Secondary | ICD-10-CM | POA: Diagnosis not present

## 2021-06-24 DIAGNOSIS — Z7984 Long term (current) use of oral hypoglycemic drugs: Secondary | ICD-10-CM | POA: Diagnosis not present

## 2021-06-24 DIAGNOSIS — E119 Type 2 diabetes mellitus without complications: Secondary | ICD-10-CM | POA: Diagnosis not present

## 2021-06-24 DIAGNOSIS — Z85828 Personal history of other malignant neoplasm of skin: Secondary | ICD-10-CM | POA: Diagnosis not present

## 2021-06-24 DIAGNOSIS — Z87891 Personal history of nicotine dependence: Secondary | ICD-10-CM | POA: Diagnosis not present

## 2021-06-24 DIAGNOSIS — I2510 Atherosclerotic heart disease of native coronary artery without angina pectoris: Secondary | ICD-10-CM | POA: Diagnosis not present

## 2021-06-24 DIAGNOSIS — Z7901 Long term (current) use of anticoagulants: Secondary | ICD-10-CM | POA: Diagnosis not present

## 2021-06-24 DIAGNOSIS — Z20822 Contact with and (suspected) exposure to covid-19: Secondary | ICD-10-CM | POA: Diagnosis not present

## 2021-06-24 DIAGNOSIS — J449 Chronic obstructive pulmonary disease, unspecified: Secondary | ICD-10-CM | POA: Diagnosis not present

## 2021-06-24 DIAGNOSIS — M48062 Spinal stenosis, lumbar region with neurogenic claudication: Secondary | ICD-10-CM | POA: Diagnosis not present

## 2021-06-24 DIAGNOSIS — Z7982 Long term (current) use of aspirin: Secondary | ICD-10-CM | POA: Diagnosis not present

## 2021-06-24 NOTE — Progress Notes (Signed)
Subjective: Patient reports some back discomfort  Objective: Vital signs in last 24 hours: Temp:  [98.3 F (36.8 C)-98.9 F (37.2 C)] 98.7 F (37.1 C) (07/04 0725) Pulse Rate:  [71-87] 71 (07/04 0725) Resp:  [18-20] 18 (07/04 0725) BP: (112-158)/(77-85) 158/77 (07/04 0725) SpO2:  [87 %-94 %] 91 % (07/04 0750)  Intake/Output from previous day: 07/03 0701 - 07/04 0700 In: 716 [P.O.:716] Out: 350 [Urine:350] Intake/Output this shift: No intake/output data recorded. NAD Awake, alert Incision c/d FC x 4  Lab Results: No results for input(s): WBC, HGB, HCT, PLT in the last 72 hours. BMET No results for input(s): NA, K, CL, CO2, GLUCOSE, BUN, CREATININE, CALCIUM in the last 72 hours.  Studies/Results: No results found.  Assessment/Plan: 80 yo s/p lumbar decompression - awaiting SNF   Nathan Fernandez 06/24/2021, 11:48 AM

## 2021-06-24 NOTE — Progress Notes (Signed)
Physical Therapy Treatment Patient Details Name: Nathan Fernandez MRN: [id number] DOB: 30-Jul-1941 Today's Date: 06/24/2021    History of Present Illness Pt is an 80 y/o male with onset of bilateral LE and back pain with failed conservative treatment was taken to OR for decompressive laminectomies from L2-3, L3-4, and L4-5 with partial medial facetectomies and foraminotomies of L3, L4 and L5 nerve roots.  PMHx:  bells palsy, COPD, DM, CAD, splenic infarct, MI, OA    PT Comments    Pt reporting moderate post-operative back pain, but agreeable to hallway ambulation. Pt up in bathroom upon PT arrival to room without AD, pt presents with poor safety awareness and decreased recall of spinal precautions during mobility. Pt is frustrated about d/c plan, and is still hopeful for ST-SNF. Will continue follow acutely.    Follow Up Recommendations  Home health PT;Supervision for mobility/OOB (vs ST-SNF for safety and mobility progression)     Equipment Recommendations  Rolling walker with 5" wheels    Recommendations for Other Services       Precautions / Restrictions Precautions Precautions: Fall;Back Precaution Comments: recalls 2/3 back precautions, cues to avoid twisting spine during functional mobility Required Braces or Orthoses: Other Brace Other Brace: no brace needed, per orders Restrictions Weight Bearing Restrictions: No    Mobility  Bed Mobility Overal bed mobility: Needs Assistance             General bed mobility comments: up in bathroom    Transfers Overall transfer level: Needs assistance Equipment used: None Transfers: Sit to/from Stand Sit to Stand: Min guard         General transfer comment: for safety, reaching for environment to self-steady.  Ambulation/Gait Ambulation/Gait assistance: Min guard Gait Distance (Feet): 150 Feet Assistive device: Rolling walker (2 wheeled) Gait Pattern/deviations: Step-through pattern;Decreased stride length;Trunk  flexed;Drifts right/left Gait velocity: decr   General Gait Details: close guard for safety, verbal cuing for upright posture, navigating RW especially during directional changes. Pt initially ambulatory without AD, but reaching for environment and attempting to hold onto one handle of RW that PT brought along with in case of pt need.   Stairs             Wheelchair Mobility    Modified Rankin (Stroke Patients Only)       Balance Overall balance assessment: Needs assistance Sitting-balance support: No upper extremity supported;Feet supported Sitting balance-Leahy Scale: Good     Standing balance support: Bilateral upper extremity supported;During functional activity Standing balance-Leahy Scale: Poor                              Cognition Arousal/Alertness: Awake/alert Behavior During Therapy: WFL for tasks assessed/performed Overall Cognitive Status: No family/caregiver present to determine baseline cognitive functioning                                 General Comments: Upon PT arrival to room, pt standing in his bathroom without UE support (unsafely) with his head through an arm hole of his gown. Pt states "I need another gown, this one isn't right" but does not acknowledge he incorrectly donned gown. Poor safety awareness during mobility, reaching for environment when no AD present but still states he does not need RW.      Exercises      General Comments        Pertinent Vitals/Pain  Pain Assessment: Faces Faces Pain Scale: Hurts little more Pain Location: back Pain Descriptors / Indicators: Aching;Sore;Sharp Pain Intervention(s): Limited activity within patient's tolerance;Monitored during session;Patient requesting pain meds-RN notified    Home Living                      Prior Function            PT Goals (current goals can now be found in the care plan section) Acute Rehab PT Goals PT Goal Formulation: With  patient Time For Goal Achievement: 06/28/21 Potential to Achieve Goals: Good Progress towards PT goals: Progressing toward goals    Frequency    Min 5X/week      PT Plan Current plan remains appropriate    Co-evaluation              AM-PAC PT "6 Clicks" Mobility   Outcome Measure  Help needed turning from your back to your side while in a flat bed without using bedrails?: None Help needed moving from lying on your back to sitting on the side of a flat bed without using bedrails?: None Help needed moving to and from a bed to a chair (including a wheelchair)?: None Help needed standing up from a chair using your arms (e.g., wheelchair or bedside chair)?: A Little Help needed to walk in hospital room?: A Little Help needed climbing 3-5 steps with a railing? : A Little 6 Click Score: 21    End of Session Equipment Utilized During Treatment: Gait belt Activity Tolerance: Patient tolerated treatment well Patient left: with call bell/phone within reach;in chair;with chair alarm set Nurse Communication: Mobility status PT Visit Diagnosis: Unsteadiness on feet (R26.81);Muscle weakness (generalized) (M62.81);Other abnormalities of gait and mobility (R26.89) Pain - part of body:  (back)     Time: 8453-6468 PT Time Calculation (min) (ACUTE ONLY): 17 min  Charges:  $Gait Training: 8-22 mins                    Stacie Glaze, PT DPT Acute Rehabilitation Services Pager [phone]  Office [phone]    [place] 06/24/2021, 3:58 PM

## 2021-06-24 NOTE — TOC Progression Note (Addendum)
Transition of Care [facility]) - Progression Note    Patient Details  Name: Nathan Fernandez MRN: [id number] Date of Birth: [birth year]  Transition of Care [facility]) CM/SW [place], [place] Phone Number: 06/24/2021, 12:13 PM  Clinical Narrative:     Met with pt bedside and included his wife on speaker phone. Discussed offers; they choose [facility] as it is close to home. Pt received 2 covid vaccinations. He reports he was informed by his PCP that he shouldn't take a booster due to blood clots. CSW notified [facility]; they can accept once [name] is received. CSW started insurance auth. Ref# [id number]  Expected Discharge Plan: Skilled Nursing Facility Barriers to Discharge: Continued Medical Work up  Expected Discharge Plan and Services Expected Discharge Plan: [place] In-house Referral: Clinical Social Work Discharge Planning Services: CM Consult Post Acute Care Choice: [place], Durable Medical Equipment Living arrangements for the past 2 months: Single Family Home Expected Discharge Date: 06/22/21               DME Arranged: Berta Minor DME Agency: [employer] Date DME Agency Contacted: 06/22/21 Time DME Agency Contacted: [phone] Representative spoke with at DME Agency: [place]: PT, OT [place] Agency: [place] Date [place]: 06/22/21 Time [place]: 1519 Representative spoke with at [place]: [place] ([place]) Interventions    Readmission Risk Interventions No flowsheet data found.

## 2021-06-25 DIAGNOSIS — E119 Type 2 diabetes mellitus without complications: Secondary | ICD-10-CM | POA: Diagnosis not present

## 2021-06-25 DIAGNOSIS — Z20822 Contact with and (suspected) exposure to covid-19: Secondary | ICD-10-CM | POA: Diagnosis not present

## 2021-06-25 DIAGNOSIS — Z7982 Long term (current) use of aspirin: Secondary | ICD-10-CM | POA: Diagnosis not present

## 2021-06-25 DIAGNOSIS — I2510 Atherosclerotic heart disease of native coronary artery without angina pectoris: Secondary | ICD-10-CM | POA: Diagnosis not present

## 2021-06-25 DIAGNOSIS — Z85828 Personal history of other malignant neoplasm of skin: Secondary | ICD-10-CM | POA: Diagnosis not present

## 2021-06-25 DIAGNOSIS — Z79899 Other long term (current) drug therapy: Secondary | ICD-10-CM | POA: Diagnosis not present

## 2021-06-25 DIAGNOSIS — Z7901 Long term (current) use of anticoagulants: Secondary | ICD-10-CM | POA: Diagnosis not present

## 2021-06-25 DIAGNOSIS — M5416 Radiculopathy, lumbar region: Secondary | ICD-10-CM | POA: Diagnosis not present

## 2021-06-25 DIAGNOSIS — I10 Essential (primary) hypertension: Secondary | ICD-10-CM | POA: Diagnosis not present

## 2021-06-25 DIAGNOSIS — M48062 Spinal stenosis, lumbar region with neurogenic claudication: Secondary | ICD-10-CM | POA: Diagnosis not present

## 2021-06-25 DIAGNOSIS — Z7984 Long term (current) use of oral hypoglycemic drugs: Secondary | ICD-10-CM | POA: Diagnosis not present

## 2021-06-25 DIAGNOSIS — Z87891 Personal history of nicotine dependence: Secondary | ICD-10-CM | POA: Diagnosis not present

## 2021-06-25 DIAGNOSIS — J449 Chronic obstructive pulmonary disease, unspecified: Secondary | ICD-10-CM | POA: Diagnosis not present

## 2021-06-25 LAB — SARS CORONAVIRUS 2 (TAT 6-24 HRS): SARS Coronavirus 2: NEGATIVE

## 2021-06-25 NOTE — Plan of Care (Signed)
  Problem: Education: Goal: Knowledge of General Education information will improve Description: Including pain rating scale, medication(s)/side effects and non-pharmacologic comfort measures Outcome: Progressing   Problem: Activity: Goal: Risk for activity intolerance will decrease Outcome: Progressing   Problem: Nutrition: Goal: Adequate nutrition will be maintained Outcome: Progressing   

## 2021-06-25 NOTE — TOC Progression Note (Addendum)
Transition of Care [facility]) - Progression Note    Patient Details  Name: Nathan Fernandez MRN: [id number] Date of Birth: 05-Sep-1941  Transition of Care [facility]) CM/SW [place], [place] Phone Number: 06/25/2021, 10:33 AM  Clinical Narrative:     [name] is approved from 7/5 -- 7/7.  [name] ID: [id number] Ref# [id number]  Notified [facility], they now cannot take pt until tomorrow.   1215: CSW contacted all other offers to see if they would be able to take pt today. Facility would need to be changed on SNF authorization.   Notified pt the [facility] could accept tomorrow. Explained other facilities may be able to accept today. Pt maintains choice for [facility].   9458: [facility] cannot accept because pt is not boosted and they don't have quarantine bed currently.   1420: Currently unclear if other offers can accept today versus tomorrow. At this time covid has not resulted and auth would require "rebuild" with new facility once identified. Will plan for [facility] tomorrow as that is the facility that pt wants and will likely be unable to make another facility work for today.   Expected Discharge Plan: Skilled Nursing Facility Barriers to Discharge: Continued Medical Work up  Expected Discharge Plan and Services Expected Discharge Plan: [place] In-house Referral: Clinical Social Work Discharge Planning Services: CM Consult Post Acute Care Choice: [place], Durable Medical Equipment Living arrangements for the past 2 months: Single Family Home Expected Discharge Date: 06/22/21               DME Arranged: Berta Minor DME Agency: [employer] Date DME Agency Contacted: 06/22/21 Time DME Agency Contacted: [phone] Representative spoke with at DME Agency: [place]: PT, OT [place] Agency: [place] Date [place]: 06/22/21 Time [place]: 1519 Representative spoke with at [place]: [place] ([place]) Interventions    Readmission Risk Interventions No flowsheet data found.

## 2021-06-25 NOTE — Plan of Care (Signed)
Patient here S/P spinal surgery. No acute events overnight.   Problem: Education: Goal: Knowledge of General Education information will improve Description: Including pain rating scale, medication(s)/side effects and non-pharmacologic comfort measures Outcome: Progressing   Problem: Health Behavior/Discharge Planning: Goal: Ability to manage health-related needs will improve Outcome: Progressing   Problem: Clinical Measurements: Goal: Ability to maintain clinical measurements within normal limits will improve Outcome: Progressing Goal: Will remain free from infection Outcome: Progressing Goal: Diagnostic test results will improve Outcome: Progressing Goal: Respiratory complications will improve Outcome: Progressing Goal: Cardiovascular complication will be avoided Outcome: Progressing   Problem: Activity: Goal: Risk for activity intolerance will decrease Outcome: Progressing   Problem: Nutrition: Goal: Adequate nutrition will be maintained Outcome: Progressing   Problem: Coping: Goal: Level of anxiety will decrease Outcome: Progressing   Problem: Elimination: Goal: Will not experience complications related to bowel motility Outcome: Progressing Goal: Will not experience complications related to urinary retention Outcome: Progressing   Problem: Pain Managment: Goal: General experience of comfort will improve Outcome: Progressing   Problem: Safety: Goal: Ability to remain free from injury will improve Outcome: Progressing   Problem: Skin Integrity: Goal: Risk for impaired skin integrity will decrease Outcome: Progressing

## 2021-06-25 NOTE — Progress Notes (Signed)
Physical Therapy Treatment Patient Details Name: Nathan Fernandez MRN: [id number] DOB: 01/12/1941 Today's Date: 06/25/2021    History of Present Illness Pt is an 80 y/o male with onset of bilateral LE and back pain with failed conservative treatment was taken to OR for decompressive laminectomies from L2-3, L3-4, and L4-5 with partial medial facetectomies and foraminotomies of L3, L4 and L5 nerve roots on 06/19/21.  PMHx:  bells palsy, COPD, DM, CAD, splenic infarct, MI, OA    PT Comments    Pt making good progress.  He wanted to attempt ambulation without RW, but had increased antalgic pattern and instability.  Recommend continued use of RW.  He is only able to recall 1/3 back precautions even after multiple methods of reinforcement; however, he does follow them with basic transfers.  From therapy perspective could return home with family support, but pt wanting SNF.      Follow Up Recommendations  Home health PT;Supervision for mobility/OOB (Pt wants SNF and has been approved)     Equipment Recommendations  Rolling walker with 5" wheels    Recommendations for Other Services       Precautions / Restrictions Precautions Precautions: Fall;Back Precaution Booklet Issued: Yes (comment) Precaution Comments: Provided handout Splint/Cast: no back brace ordered by MD    Mobility  Bed Mobility Overal bed mobility: Needs Assistance Bed Mobility: Rolling;Sidelying to Sit Rolling: Supervision Sidelying to sit: Supervision       General bed mobility comments: Log roll technique with rails without cues    Transfers Overall transfer level: Needs assistance Equipment used: None Transfers: Sit to/from Stand Sit to Stand: Supervision         General transfer comment: Performed x 2  Ambulation/Gait Ambulation/Gait assistance: Min guard Gait Distance (Feet): 100 Feet Assistive device:  (hand rail) Gait Pattern/deviations: Step-through pattern;Antalgic;Decreased stride length Gait  velocity: decr   General Gait Details: Pt wanting to attempt without RW as he has been ambulating in room without AD.  However, he needed handrail and when handrail not available provided HHA.  Unsteady without RW and more antalgic pattern.  Recommended to continue using RW for safety, stability, and pain control   Stairs             Wheelchair Mobility    Modified Rankin (Stroke Patients Only)       Balance Overall balance assessment: Needs assistance Sitting-balance support: No upper extremity supported;Feet supported Sitting balance-Leahy Scale: Good     Standing balance support: Single extremity supported;Bilateral upper extremity supported;No upper extremity supported Standing balance-Leahy Scale: Fair Standing balance comment: Pt did take a few steps and could static stand without support; however, without RW with gait was unsteady requiring increased guarding                            Cognition Arousal/Alertness: Awake/alert Behavior During Therapy: WFL for tasks assessed/performed Overall Cognitive Status: No family/caregiver present to determine baseline cognitive functioning                                 General Comments: Pt does have difficulty recalling back precautions even after just 1-2 mins; however, does demonstrate correctly majority of the time.  Pt states "my memory is not what it used to be"      Exercises      General Comments General comments (skin integrity, edema, etc.): Spent increased time  on education.  Provided another 2 copies of back precautions and educated.  Educated on "BLT" meaning at beginning of session and pt only able to recall 1/3 after 2 mins. Educated again and performed gait.  At end of session still only recalling 1/3; however, did fair with following precautions with basic transfers and activities.  Educated on precautions with transfers, dressing, car transfers, ADLs.      Pertinent Vitals/Pain  Pain Assessment: 0-10 Pain Score: 3  Pain Location: back Pain Descriptors / Indicators: Discomfort ("aggravating and constant") Pain Intervention(s): Limited activity within patient's tolerance;Monitored during session;Premedicated before session    Home Living                      Prior Function            PT Goals (current goals can now be found in the care plan section) Acute Rehab PT Goals Patient Stated Goal: go to rehab to get stronger, then go home PT Goal Formulation: With patient Time For Goal Achievement: 06/28/21 Potential to Achieve Goals: Good Progress towards PT goals: Progressing toward goals    Frequency    Min 5X/week      PT Plan Current plan remains appropriate    Co-evaluation              AM-PAC PT "6 Clicks" Mobility   Outcome Measure  Help needed turning from your back to your side while in a flat bed without using bedrails?: None Help needed moving from lying on your back to sitting on the side of a flat bed without using bedrails?: A Little Help needed moving to and from a bed to a chair (including a wheelchair)?: A Little Help needed standing up from a chair using your arms (e.g., wheelchair or bedside chair)?: A Little Help needed to walk in hospital room?: A Little Help needed climbing 3-5 steps with a railing? : A Little 6 Click Score: 19    End of Session Equipment Utilized During Treatment: Gait belt Activity Tolerance: Patient tolerated treatment well Patient left: with call bell/phone within reach;in chair;with chair alarm set Nurse Communication: Mobility status PT Visit Diagnosis: Unsteadiness on feet (R26.81);Muscle weakness (generalized) (M62.81);Other abnormalities of gait and mobility (R26.89)     Time: 6195-0932 PT Time Calculation (min) (ACUTE ONLY): 23 min  Charges:  $Gait Training: 8-22 mins $Therapeutic Activity: 8-22 mins                     Abran Richard, PT Acute Rehab Services Pager [phone] [facility] [place] 06/25/2021, 11:34 AM

## 2021-06-25 NOTE — Progress Notes (Signed)
Occupational Therapy Treatment Patient Details Name: Nathan Fernandez MRN: [id number] DOB: 1941/10/01 Today's Date: 06/25/2021    History of present illness Pt is an 80 y/o male with onset of bilateral LE and back pain with failed conservative treatment was taken to OR for decompressive laminectomies from L2-3, L3-4, and L4-5 with partial medial facetectomies and foraminotomies of L3, L4 and L5 nerve roots on 06/19/21.  PMHx:  bells palsy, COPD, DM, CAD, splenic infarct, MI, OA   OT comments  Nathan Fernandez was progressing well. Pt declined need for ADLs but agreeable to short walk with distant min guard over all. Pt noted with minor LOB with rw, able to self correct, and required verbal cues for safety. Pt completed toilet transfer and toileting with min guard over all. He refused to use the walker in the bathroom despite education. Pt continues to benefit from OT acutely. Functionally, pt is safe enough to return home, however he and the family are requesting a short SNF stay to get stronger prior to d/c home.    Follow Up Recommendations  Supervision/Assistance - 24 hour;SNF    Equipment Recommendations  3 in 1 bedside commode;Other (comment);Tub/shower seat          Precautions / Restrictions Precautions Precautions: Fall;Back Precaution Booklet Issued: Yes (comment) Precaution Comments: reviewed all precautions Splint/Cast: no back brace ordered by MD Restrictions Weight Bearing Restrictions: No       Mobility Bed Mobility Overal bed mobility: Needs Assistance Bed Mobility: Rolling;Sidelying to Sit Rolling: Supervision Sidelying to sit: Supervision       General bed mobility comments: pt sitting in chair upon arrival    Transfers Overall transfer level: Needs assistance Equipment used: None Transfers: Sit to/from Stand Sit to Stand: Supervision         General transfer comment: Performed x 2    Balance Overall balance assessment: Needs assistance Sitting-balance  support: No upper extremity supported;Feet supported Sitting balance-Leahy Scale: Good     Standing balance support: Single extremity supported;During functional activity Standing balance-Leahy Scale: Fair Standing balance comment: pt ambulated in the bathroom without rw - close min guard for safety.                           ADL either performed or assessed with clinical judgement   ADL Overall ADL's : Needs assistance/impaired                         Toilet Transfer: Supervision/safety;Comfort height toilet;Grab bars Toilet Transfer Details (indicate cue type and reason): Pt refused rw during toilet transfer - supervision for safety Toileting- Clothing Manipulation and Hygiene: Supervision/safety;Sit to/from stand       Functional mobility during ADLs: Supervision/safety;Rolling walker;Cueing for safety General ADL Comments: Pt refused to use RW in bathroom, and reported taht he has been going to the bathroom by himself "since thursday." adn became frustrated when therapist attempted to educate on icnrased safety with rw      Cognition Arousal/Alertness: Awake/alert Behavior During Therapy: WFL for tasks assessed/performed Overall Cognitive Status: Within Functional Limits for tasks assessed         General Comments: Pt recalled 2/3 precautions. He stated "i can walk better without that thing" in regard to his RW. Pt become frustrated when asked to use RW in bathroom and pushed the RW away. Pt's wife said that this is normal behavior for him.  General Comments spent increased time going over back precautions. pt only recalling 2/3 at this time, however demonstrated good adherence throguhout functional activity. pt reported that he has been walking to the bathroom by hisself without the rw. Attempted to educate on increased stability, balance and safety with rw however pt became frustrated and was not accepting of advise or education     Pertinent Vitals/ Pain       Pain Assessment: Faces Pain Score: 0-No pain Pain Location: back Pain Descriptors / Indicators: Discomfort ("aggravating and constant") Pain Intervention(s): Limited activity within patient's tolerance;Monitored during session   Frequency  Min 2X/week        Progress Toward Goals  OT Goals(current goals can now be found in the care plan section)  Progress towards OT goals: Progressing toward goals  Acute Rehab OT Goals Patient Stated Goal: go to rehab to get stronger, then go home OT Goal Formulation: With patient/family Time For Goal Achievement: 07/04/21 Potential to Achieve Goals: Good ADL Goals Pt Will Perform Grooming: with supervision;with set-up;standing;with caregiver independent in assisting Pt Will Perform Upper Body Bathing: with set-up;with modified independence;sitting;with caregiver independent in assisting Pt Will Perform Lower Body Bathing: with min assist;with adaptive equipment;with caregiver independent in assisting Pt Will Perform Upper Body Dressing: with set-up;with modified independence;sitting;with caregiver independent in assisting Pt Will Perform Lower Body Dressing: with min assist;with adaptive equipment;with caregiver independent in assisting Pt Will Transfer to Toilet: with min guard assist;with supervision;ambulating Pt Will Perform Toileting - Clothing Manipulation and hygiene: with min guard assist;with supervision;sit to/from stand;with caregiver independent in assisting Pt Will Perform Tub/Shower Transfer: with min assist;with min guard assist;ambulating  Plan Discharge plan needs to be updated;Frequency remains appropriate       AM-PAC OT "6 Clicks" Daily Activity     Outcome Measure   Help from another person eating meals?: None Help from another person taking care of personal grooming?: A Little Help from another person toileting, which includes using toliet, bedpan, or urinal?: A Little Help from  another person bathing (including washing, rinsing, drying)?: A Little Help from another person to put on and taking off regular upper body clothing?: None Help from another person to put on and taking off regular lower body clothing?: A Little 6 Click Score: 20    End of Session Equipment Utilized During Treatment: Gait belt;Rolling walker  OT Visit Diagnosis: Unsteadiness on feet (R26.81);Other abnormalities of gait and mobility (R26.89);Muscle weakness (generalized) (M62.81);Pain   Activity Tolerance Patient tolerated treatment well   Patient Left in chair;with call bell/phone within reach;with chair alarm set;with family/visitor present   Nurse Communication Mobility status        Time: 6144-3154 OT Time Calculation (min): 15 min  Charges: OT General Charges $OT Visit: 1 Visit OT Treatments $Self Care/Home Management : 8-22 mins    Devian Bartolomei A Braddock [place] 06/25/2021, 2:29 PM

## 2021-06-26 DIAGNOSIS — I5022 Chronic systolic (congestive) heart failure: Secondary | ICD-10-CM | POA: Diagnosis not present

## 2021-06-26 DIAGNOSIS — Z48811 Encounter for surgical aftercare following surgery on the nervous system: Secondary | ICD-10-CM | POA: Diagnosis not present

## 2021-06-26 DIAGNOSIS — M5430 Sciatica, unspecified side: Secondary | ICD-10-CM | POA: Diagnosis not present

## 2021-06-26 DIAGNOSIS — M48061 Spinal stenosis, lumbar region without neurogenic claudication: Secondary | ICD-10-CM | POA: Diagnosis not present

## 2021-06-26 DIAGNOSIS — M48062 Spinal stenosis, lumbar region with neurogenic claudication: Secondary | ICD-10-CM | POA: Diagnosis not present

## 2021-06-26 DIAGNOSIS — I639 Cerebral infarction, unspecified: Secondary | ICD-10-CM | POA: Diagnosis not present

## 2021-06-26 DIAGNOSIS — J449 Chronic obstructive pulmonary disease, unspecified: Secondary | ICD-10-CM | POA: Diagnosis not present

## 2021-06-26 DIAGNOSIS — G4700 Insomnia, unspecified: Secondary | ICD-10-CM | POA: Diagnosis not present

## 2021-06-26 DIAGNOSIS — Z7984 Long term (current) use of oral hypoglycemic drugs: Secondary | ICD-10-CM | POA: Diagnosis not present

## 2021-06-26 DIAGNOSIS — E46 Unspecified protein-calorie malnutrition: Secondary | ICD-10-CM | POA: Diagnosis not present

## 2021-06-26 DIAGNOSIS — Z85828 Personal history of other malignant neoplasm of skin: Secondary | ICD-10-CM | POA: Diagnosis not present

## 2021-06-26 DIAGNOSIS — E1169 Type 2 diabetes mellitus with other specified complication: Secondary | ICD-10-CM | POA: Diagnosis not present

## 2021-06-26 DIAGNOSIS — M4326 Fusion of spine, lumbar region: Secondary | ICD-10-CM | POA: Diagnosis not present

## 2021-06-26 DIAGNOSIS — Z86718 Personal history of other venous thrombosis and embolism: Secondary | ICD-10-CM | POA: Diagnosis not present

## 2021-06-26 DIAGNOSIS — I10 Essential (primary) hypertension: Secondary | ICD-10-CM | POA: Diagnosis not present

## 2021-06-26 DIAGNOSIS — Z79899 Other long term (current) drug therapy: Secondary | ICD-10-CM | POA: Diagnosis not present

## 2021-06-26 DIAGNOSIS — E119 Type 2 diabetes mellitus without complications: Secondary | ICD-10-CM | POA: Diagnosis not present

## 2021-06-26 DIAGNOSIS — I2510 Atherosclerotic heart disease of native coronary artery without angina pectoris: Secondary | ICD-10-CM | POA: Diagnosis not present

## 2021-06-26 DIAGNOSIS — H409 Unspecified glaucoma: Secondary | ICD-10-CM | POA: Diagnosis not present

## 2021-06-26 DIAGNOSIS — M6281 Muscle weakness (generalized): Secondary | ICD-10-CM | POA: Diagnosis not present

## 2021-06-26 DIAGNOSIS — M549 Dorsalgia, unspecified: Secondary | ICD-10-CM | POA: Diagnosis not present

## 2021-06-26 DIAGNOSIS — Z20822 Contact with and (suspected) exposure to covid-19: Secondary | ICD-10-CM | POA: Diagnosis not present

## 2021-06-26 DIAGNOSIS — Z87891 Personal history of nicotine dependence: Secondary | ICD-10-CM | POA: Diagnosis not present

## 2021-06-26 DIAGNOSIS — Z7982 Long term (current) use of aspirin: Secondary | ICD-10-CM | POA: Diagnosis not present

## 2021-06-26 DIAGNOSIS — Z9889 Other specified postprocedural states: Secondary | ICD-10-CM | POA: Diagnosis not present

## 2021-06-26 DIAGNOSIS — M1990 Unspecified osteoarthritis, unspecified site: Secondary | ICD-10-CM | POA: Diagnosis not present

## 2021-06-26 DIAGNOSIS — Z743 Need for continuous supervision: Secondary | ICD-10-CM | POA: Diagnosis not present

## 2021-06-26 DIAGNOSIS — E785 Hyperlipidemia, unspecified: Secondary | ICD-10-CM | POA: Diagnosis not present

## 2021-06-26 DIAGNOSIS — M5416 Radiculopathy, lumbar region: Secondary | ICD-10-CM | POA: Diagnosis not present

## 2021-06-26 DIAGNOSIS — Z7901 Long term (current) use of anticoagulants: Secondary | ICD-10-CM | POA: Diagnosis not present

## 2021-06-26 DIAGNOSIS — I252 Old myocardial infarction: Secondary | ICD-10-CM | POA: Diagnosis not present

## 2021-06-26 MED ORDER — OXYCODONE HCL 5 MG PO TABS: 5.0000 mg | ORAL_TABLET | Freq: Four times a day (QID) | ORAL | 0 refills | Status: DC | PRN

## 2021-06-26 NOTE — Progress Notes (Signed)
Physical Therapy Treatment Patient Details Name: Nathan Fernandez MRN: [id number] DOB: 01-10-1941 Today's Date: 06/26/2021    History of Present Illness Pt is an 80 y/o male with onset of bilateral LE and back pain with failed conservative treatment was taken to OR for decompressive laminectomies from L2-3, L3-4, and L4-5 with partial medial facetectomies and foraminotomies of L3, L4 and L5 nerve roots on 06/19/21.  PMHx:  bells palsy, COPD, DM, CAD, splenic infarct, MI, OA    PT Comments    Pt was seen for mobility on RW and to re-instruct his follow through on back precautions.  Pt is expecting to go to SNF, but has made progress since his time in hosp.  Follow up with his discharge expected today and will see tomorrow if dc is delayed.  Continue to instruct back precautions to elicit more consistent compliance.  Follow Up Recommendations  SNF;Home health PT     Equipment Recommendations  Rolling walker with 5" wheels    Recommendations for Other Services       Precautions / Restrictions Precautions Precautions: Fall;Back Precaution Booklet Issued: Yes (comment) Splint/Cast: no back brace ordered by MD Restrictions Weight Bearing Restrictions: No    Mobility  Bed Mobility Overal bed mobility: Needs Assistance Bed Mobility: Supine to Sit Rolling: Supervision Sidelying to sit: Supervision            Transfers Overall transfer level: Needs assistance Equipment used: Rolling walker (2 wheeled) Transfers: Sit to/from Stand Sit to Stand: Supervision            Ambulation/Gait Ambulation/Gait assistance: Min guard Gait Distance (Feet): 150 Feet Assistive device: Rolling walker (2 wheeled) Gait Pattern/deviations: Step-through pattern;Decreased stride length;Narrow base of support Gait velocity: reduced Gait velocity interpretation: <1.31 ft/sec, indicative of household ambulator General Gait Details: discussed using the walker with PT and agreed his back needed the  support   Stairs             Wheelchair Mobility    Modified Rankin (Stroke Patients Only)       Balance Overall balance assessment: Needs assistance Sitting-balance support: Feet supported Sitting balance-Leahy Scale: Good     Standing balance support: Bilateral upper extremity supported Standing balance-Leahy Scale: Fair                              Cognition Arousal/Alertness: Awake/alert Behavior During Therapy: WFL for tasks assessed/performed Overall Cognitive Status: Within Functional Limits for tasks assessed                                        Exercises      General Comments General comments (skin integrity, edema, etc.): pt is partially aware of the back precautions, not following consistently      Pertinent Vitals/Pain Pain Assessment: Faces Faces Pain Scale: No hurt    Home Living                      Prior Function            PT Goals (current goals can now be found in the care plan section) Progress towards PT goals: Progressing toward goals    Frequency           PT Plan Current plan remains appropriate    Co-evaluation  AM-PAC PT "6 Clicks" Mobility   Outcome Measure  Help needed turning from your back to your side while in a flat bed without using bedrails?: None Help needed moving from lying on your back to sitting on the side of a flat bed without using bedrails?: None Help needed moving to and from a bed to a chair (including a wheelchair)?: A Little Help needed standing up from a chair using your arms (e.g., wheelchair or bedside chair)?: A Little Help needed to walk in hospital room?: A Little Help needed climbing 3-5 steps with a railing? : A Little 6 Click Score: 20    End of Session Equipment Utilized During Treatment: Gait belt Activity Tolerance: Patient tolerated treatment well Patient left: with call bell/phone within reach;in chair;with chair alarm  set Nurse Communication: Mobility status PT Visit Diagnosis: Unsteadiness on feet (R26.81);Muscle weakness (generalized) (M62.81);Other abnormalities of gait and mobility (R26.89)     Time: 4961-1643 PT Time Calculation (min) (ACUTE ONLY): 28 min  Charges:  $Gait Training: 8-22 mins $Therapeutic Activity: 8-22 mins        Ramond Dial 06/26/2021, 8:35 PM  Mee Hives, PT MS Acute Rehab Dept. Number: [place]

## 2021-06-26 NOTE — TOC Transition Note (Signed)
Transition of Care [facility]) - CM/SW Discharge Note   Patient Details  Name: Nathan Fernandez MRN: [id number] Date of Birth: [birth year]  Transition of [facility]) CM/SW Contact:  Bethann Berks[place], [place] Phone Number: 06/26/2021, 2:17 PM   Clinical Narrative:     Patient will DC to: [facility] Anticipated DC date: 06/26/21 Transport by: Corey Harold   Per MD patient ready for DC to [facility]. RN, patient, and facility notified of DC. Discharge Summary and FL2 sent to facility. RN to call report prior to discharge [phone] room 123). DC packet on chart. Ambulance transport requested for patient.   CSW will sign off for now as social work intervention is no longer needed. Please consult [place] again if new needs arise.    Final next level of care: Skilled Nursing Facility Barriers to Discharge: No Barriers Identified   Patient Goals and CMS Choice Patient states their goals for this hospitalization and ongoing recovery are:: wants to go to [place] preferrably first then return home with wife CMS Medicare.gov Compare Post Acute Care list provided to:: Patient Choice offered to / list presented to : Patient  Discharge Placement              Patient [facility] bed at: [facility] Patient to be transferred to facility by: [facility]   Patient and family notified of of transfer: 06/26/21  Discharge Plan and Services In-house Referral: Clinical Social Work Discharge Planning Services: [employer] Consult Post Acute Care Choice: [place], Durable Medical Equipment          DME Arranged: 3-N-1, [place] Rile DME Agency: [employer] Date DME Agency Contacted: 06/22/21 Time DME Agency Contacted: [phone] Representative spoke with at DME Agency: [place]: PT, OT [place] Agency: [place] Date [place]: 06/22/21 Time [place]: 1519 Representative spoke with at [place]: [place] ([place]) Interventions      Readmission Risk Interventions No flowsheet data found.

## 2021-06-26 NOTE — Progress Notes (Signed)
[place] [facility] office was called to get in touch with Dr to put discharge orders in. Message was left. RN called me back stating she would get in touch with PA who will put the discharge orders in.

## 2021-06-26 NOTE — Progress Notes (Signed)
Pt was given his AVS discharge summary and went over with him. One was also placed on chart for PT[facility]. Report was given to RN at [facility]. [facility] was called for transportation.

## 2021-06-26 NOTE — Plan of Care (Signed)
  Problem: Education: Goal: Knowledge of General Education information will improve Description: Including pain rating scale, medication(s)/side effects and non-pharmacologic comfort measures Outcome: Adequate for Discharge   Problem: Health Behavior/Discharge Planning: Goal: Ability to manage health-related needs will improve Outcome: Adequate for Discharge   Problem: Clinical Measurements: Goal: Ability to maintain clinical measurements within normal limits will improve Outcome: Adequate for Discharge Goal: Will remain free from infection Outcome: Adequate for Discharge Goal: Diagnostic test results will improve Outcome: Adequate for Discharge Goal: Respiratory complications will improve Outcome: Adequate for Discharge Goal: Cardiovascular complication will be avoided Outcome: Adequate for Discharge   Problem: Activity: Goal: Risk for activity intolerance will decrease Outcome: Adequate for Discharge   Problem: Nutrition: Goal: Adequate nutrition will be maintained Outcome: Adequate for Discharge   Problem: Coping: Goal: Level of anxiety will decrease Outcome: Adequate for Discharge   Problem: Elimination: Goal: Will not experience complications related to bowel motility Outcome: Adequate for Discharge Goal: Will not experience complications related to urinary retention Outcome: Adequate for Discharge   Problem: Pain Managment: Goal: General experience of comfort will improve Outcome: Adequate for Discharge   Problem: Safety: Goal: Ability to remain free from injury will improve Outcome: Adequate for Discharge   Problem: Skin Integrity: Goal: Risk for impaired skin integrity will decrease Outcome: Adequate for Discharge   Problem: Acute Rehab PT Goals(only PT should resolve) Goal: Pt Will Go Supine/Side To Sit Outcome: Adequate for Discharge Goal: Pt Will Transfer Bed To Chair/Chair To Bed Outcome: Adequate for Discharge Goal: Pt Will Perform Standing Balance  Or Pre-Gait Outcome: Adequate for Discharge Goal: Pt Will Ambulate Outcome: Adequate for Discharge Goal: Pt Will Go Up/Down Stairs Outcome: Adequate for Discharge   Problem: Acute Rehab OT Goals (only OT should resolve) Goal: Pt. Will Perform Grooming Outcome: Adequate for Discharge Goal: Pt. Will Perform Upper Body Bathing Outcome: Adequate for Discharge Goal: Pt. Will Perform Lower Body Bathing Outcome: Adequate for Discharge Goal: Pt. Will Perform Upper Body Dressing Outcome: Adequate for Discharge Goal: Pt. Will Perform Lower Body Dressing Outcome: Adequate for Discharge Goal: Pt. Will Transfer To Toilet Outcome: Adequate for Discharge Goal: Pt. Will Perform Toileting-Clothing Manipulation Outcome: Adequate for Discharge Goal: Pt. Will Perform Tub/Shower Transfer Outcome: Adequate for Discharge

## 2021-06-26 NOTE — TOC Progression Note (Signed)
Transition of Care [facility]) - Progression Note    Patient Details  Name: Nathan Fernandez MRN: [id number] Date of Birth: 11-Feb-1941  [facility]) CM/SW [place], [place] Phone Number: 06/26/2021, 12:53 PM  Clinical Narrative:     Facility is ready to accept pt. [name] is approved. Waiting on DC summary and hand signed narcotic prescriptions. Will arrange PTAR once prescriptions and DC summary are completed   Expected Discharge Plan: [place] Barriers to Discharge: Continued Medical Work up  Expected Discharge Plan and Services Expected Discharge Plan: [place] In-house Referral: Clinical Social Work Discharge Planning Services: CM Consult Post Acute Care Choice: [place] arrangements for the past 2 months: Single Family Home Expected Discharge Date: 06/22/21               DME Arranged: Berta Minor DME Agency: [employer] Date DME Agency Contacted: 06/22/21 Time DME Agency Contacted: [phone] Representative spoke with at DME Agency: [place]: PT, OT [place] Agency: [place] Date [place]: 06/22/21 Time [place]: 1519 Representative spoke with at [place]: [place] ([place]) Interventions    Readmission Risk Interventions No flowsheet data found.

## 2021-06-27 DIAGNOSIS — I10 Essential (primary) hypertension: Secondary | ICD-10-CM | POA: Diagnosis not present

## 2021-06-27 DIAGNOSIS — E1169 Type 2 diabetes mellitus with other specified complication: Secondary | ICD-10-CM | POA: Diagnosis not present

## 2021-06-27 DIAGNOSIS — Z9889 Other specified postprocedural states: Secondary | ICD-10-CM | POA: Diagnosis not present

## 2021-06-30 LAB — CUP PACEART REMOTE DEVICE CHECK
Date Time Interrogation Session: 20220705230920
Implantable Pulse Generator Implant Date: 20210525

## 2021-07-01 ENCOUNTER — Ambulatory Visit (INDEPENDENT_AMBULATORY_CARE_PROVIDER_SITE_OTHER): Payer: Medicare Other

## 2021-07-01 DIAGNOSIS — I10 Essential (primary) hypertension: Secondary | ICD-10-CM | POA: Diagnosis not present

## 2021-07-01 DIAGNOSIS — I639 Cerebral infarction, unspecified: Secondary | ICD-10-CM | POA: Diagnosis not present

## 2021-07-01 DIAGNOSIS — E119 Type 2 diabetes mellitus without complications: Secondary | ICD-10-CM | POA: Diagnosis not present

## 2021-07-04 DIAGNOSIS — M4326 Fusion of spine, lumbar region: Secondary | ICD-10-CM | POA: Diagnosis not present

## 2021-07-04 DIAGNOSIS — I10 Essential (primary) hypertension: Secondary | ICD-10-CM | POA: Diagnosis not present

## 2021-07-04 DIAGNOSIS — I5022 Chronic systolic (congestive) heart failure: Secondary | ICD-10-CM | POA: Diagnosis not present

## 2021-07-04 DIAGNOSIS — M6281 Muscle weakness (generalized): Secondary | ICD-10-CM | POA: Diagnosis not present

## 2021-07-04 DIAGNOSIS — J449 Chronic obstructive pulmonary disease, unspecified: Secondary | ICD-10-CM | POA: Diagnosis not present

## 2021-07-04 DIAGNOSIS — M48061 Spinal stenosis, lumbar region without neurogenic claudication: Secondary | ICD-10-CM | POA: Diagnosis not present

## 2021-07-04 DIAGNOSIS — M1990 Unspecified osteoarthritis, unspecified site: Secondary | ICD-10-CM | POA: Diagnosis not present

## 2021-07-15 ENCOUNTER — Telehealth: Payer: Self-pay

## 2021-07-15 NOTE — Progress Notes (Addendum)
Chronic Care Management Pharmacy Assistant   Name: Nathan Fernandez  MRN: [id number] DOB: [birth year]   Reason for Encounter: Disease State HTN    Recent office visits:  None since the last CCM contact  Recent consult visits:  05/23/21 - Neurosurgery - no data found 05/13/21 - Cardiology no medication changes 04/24/21 - Orthopedic Surgery no medication changes  Hospital visits:  06/19/21 thru 06/26/21 - Neurosurgery Laminectomy    Medications: Outpatient Encounter Medications as of 07/15/2021  Medication Sig Note   acetaminophen (TYLENOL) 325 MG tablet Take 1.5 tablets (487.5 mg total) by mouth 2 (two) times daily.    albuterol (VENTOLIN HFA) 108 (90 Base) MCG/ACT inhaler Inhale 1-2 puffs into the lungs every 6 (six) hours as needed (COPD).    Ascorbic Acid (VITAMIN C) 1000 MG tablet Take 1,000 mg by mouth daily.    aspirin 81 MG EC tablet Take 81 mg by mouth daily. 06/18/2021: Hold   buPROPion (WELLBUTRIN SR) 150 MG 12 hr tablet Take 1 tablet (150 mg total) by mouth at bedtime.    carvedilol (COREG) 6.25 MG tablet Take 1 tablet (6.25 mg total) by mouth in the morning and at bedtime.    Cholecalciferol (VITAMIN D) 125 MCG (5000 UT) CAPS Take 5,000 Units by mouth daily.    dapagliflozin propanediol (FARXIGA) 5 MG TABS tablet Take 1 tablet (5 mg total) by mouth daily before breakfast.    dorzolamide-timolol (COSOPT) 22.3-6.8 MG/ML ophthalmic solution Place 1 drop into both eyes 2 (two) times daily.    fenofibrate (TRICOR) 48 MG tablet Take 1 tablet (48 mg total) by mouth daily.    gabapentin (NEURONTIN) 100 MG capsule Take 1 capsule (100 mg total) by mouth 2 (two) times daily.    hydrALAZINE (APRESOLINE) 10 MG tablet TAKE 1 TABLET (10 MG TOTAL) BY MOUTH EVERY 12 (TWELVE) HOURS.    isosorbide mononitrate (IMDUR) 30 MG 24 hr tablet Take 0.5 tablets (15 mg total) by mouth daily.    ketoconazole (NIZORAL) 2 % cream Apply 1 application topically daily as needed for irritation.    nitroGLYCERIN  (NITROSTAT) 0.4 MG SL tablet Place 1 tablet (0.4 mg total) under the tongue every 5 (five) minutes as needed for chest pain. Up to 3 doses    oxyCODONE (OXY IR/ROXICODONE) 5 MG immediate release tablet Take 1 tablet (5 mg total) by mouth every 6 (six) hours as needed for severe pain ((score 7 to 10)).    Polyethyl Glycol-Propyl Glycol (SYSTANE) 0.4-0.3 % GEL ophthalmic gel Place 1 application into the left eye daily as needed (Dry eyes).    rivaroxaban (XARELTO) 20 MG TABS tablet Take 1 tablet (20 mg total) by mouth daily with supper.    rosuvastatin (CRESTOR) 20 MG tablet Take 1 tablet (20 mg total) by mouth daily.    traZODone (DESYREL) 100 MG tablet TAKE 2 TABLETS (200 MG TOTAL) BY MOUTH AT BEDTIME.    No facility-administered encounter medications on file as of 07/15/2021.     Recent Office Vitals: BP Readings from Last 3 Encounters:  06/26/21 (!) 141/95  06/17/21 (!) 142/89  05/13/21 128/60   Pulse Readings from Last 3 Encounters:  06/26/21 74  06/17/21 70  05/13/21 70    Wt Readings from Last 3 Encounters:  06/19/21 175 lb (79.4 kg)  06/17/21 177 lb 8 oz (80.5 kg)  05/13/21 175 lb (79.4 kg)     Kidney Function Lab Results  Component Value Date/Time   CREATININE 0.87 06/17/2021 10:41 AM  CREATININE 0.84 02/21/2021 04:42 PM   CREATININE 0.95 04/16/2020 12:29 PM   GFR 82.40 02/21/2021 04:42 PM   GFRNONAA >60 06/17/2021 10:41 AM   GFRNONAA >60 04/16/2020 12:29 PM   GFRAA 79 01/09/2021 04:34 PM   GFRAA >60 04/16/2020 12:29 PM    BMP Latest Ref Rng & Units 06/17/2021 02/21/2021 02/14/2021  Glucose 70 - 99 mg/dL 154(H) 150(H) 122(H)  BUN 8 - 23 mg/dL '16 12 13  '$ Creatinine 0.61 - 1.24 mg/dL 0.87 0.84 0.97  BUN/Creat Ratio 10 - 24 - - -  Sodium 135 - 145 mmol/L 140 139 140  Potassium 3.5 - 5.1 mmol/L 4.4 4.2 4.1  Chloride 98 - 111 mmol/L 106 103 105  CO2 22 - 32 mmol/L '27 30 24  '$ Calcium 8.9 - 10.3 mg/dL 9.1 8.8 8.6(L)     Attempted contact with Marthann Schiller 3 times on  07/15/21,07/17/21,07/18/21. Unsuccessful outreach. Will attempt contact next month.   Current antihypertensive regimen:  Carvedilol 6.25 mg - 1/2 tablet twice daily Hydralazine 10 mg - 1 tablet twice daily Isosorbide mononitrate 30 mg - 1/2 tablet daily    Any recent hospitalizations or ED visits since last visit with CPP? Yes  06/19/21 thru 06/26/21  Neurosurgery Laminectomy and foraminotomy   Adherence Review: Is the patient currently on ACE/ARB medication? No Does the patient have >5 day gap between last estimated fill dates? No   Star Rating Drugs: Medication:  Last Fill: Day Supply Farxiga '5mg'$   06/27/21  - Rosuvastatin '20mg'$  07/04/21 30   Care Gaps: Last annual wellness visit: 09/07/2020   CCM appointment on 10/15/2021   Debbora Dus, CPP notified  Avel Sensor, [facility] Assistant [phone]  I have reviewed the care management and care coordination activities outlined in this encounter and I am certifying that I agree with the content of this note. No further action required.  Debbora Dus, PharmD Clinical Pharmacist [facility] at [facility] [phone]

## 2021-07-24 NOTE — Progress Notes (Signed)
Carelink Summary Report / Loop Recorder 

## 2021-08-02 ENCOUNTER — Other Ambulatory Visit: Payer: Self-pay | Admitting: Family Medicine

## 2021-08-05 ENCOUNTER — Ambulatory Visit (INDEPENDENT_AMBULATORY_CARE_PROVIDER_SITE_OTHER): Payer: Medicare Other

## 2021-08-05 DIAGNOSIS — I639 Cerebral infarction, unspecified: Secondary | ICD-10-CM

## 2021-08-05 LAB — CUP PACEART REMOTE DEVICE CHECK
Date Time Interrogation Session: 20220807230737
Implantable Pulse Generator Implant Date: 20210525

## 2021-08-15 ENCOUNTER — Other Ambulatory Visit: Payer: Self-pay | Admitting: Physician Assistant

## 2021-08-15 ENCOUNTER — Other Ambulatory Visit: Payer: Self-pay | Admitting: Family Medicine

## 2021-08-23 NOTE — Progress Notes (Signed)
Carelink Summary Report / Loop Recorder 

## 2021-08-28 ENCOUNTER — Telehealth: Payer: Self-pay | Admitting: Family Medicine

## 2021-08-29 NOTE — Telephone Encounter (Signed)
Pharmacy requesting 90 day supply instead of 30.  Also, when would you like patient to come back in for an office visit?

## 2021-08-30 NOTE — Telephone Encounter (Signed)
Can he f/u re: DM2 in about 2 months?  Rx sent.  Thanks.

## 2021-09-03 NOTE — Telephone Encounter (Signed)
Please call and schedule patient DM2 f/u in 2 months

## 2021-09-03 NOTE — Telephone Encounter (Signed)
lvmtcb

## 2021-09-09 ENCOUNTER — Encounter: Payer: Self-pay | Admitting: Family Medicine

## 2021-09-09 ENCOUNTER — Ambulatory Visit (INDEPENDENT_AMBULATORY_CARE_PROVIDER_SITE_OTHER): Payer: Medicare Other

## 2021-09-09 DIAGNOSIS — I639 Cerebral infarction, unspecified: Secondary | ICD-10-CM

## 2021-09-09 NOTE — Telephone Encounter (Signed)
LMTCB to schedule appt also mychart was sent

## 2021-09-10 LAB — CUP PACEART REMOTE DEVICE CHECK
Date Time Interrogation Session: 20220909230532
Implantable Pulse Generator Implant Date: 20210525

## 2021-09-13 NOTE — Progress Notes (Signed)
Carelink Summary Report / Loop Recorder 

## 2021-10-07 ENCOUNTER — Ambulatory Visit (INDEPENDENT_AMBULATORY_CARE_PROVIDER_SITE_OTHER): Payer: Medicare Other

## 2021-10-07 DIAGNOSIS — I639 Cerebral infarction, unspecified: Secondary | ICD-10-CM

## 2021-10-08 ENCOUNTER — Telehealth: Payer: Self-pay

## 2021-10-08 LAB — CUP PACEART REMOTE DEVICE CHECK
Date Time Interrogation Session: 20221012230447
Implantable Pulse Generator Implant Date: 20210525

## 2021-10-08 NOTE — Progress Notes (Signed)
    Chronic Care Management Pharmacy Assistant   Name: Nathan Fernandez  MRN: [id number] DOB: 1941-09-04  Reason for Encounter: CCM (Reminder Call)  Medications: Outpatient Encounter Medications as of 10/08/2021  Medication Sig Note   acetaminophen (TYLENOL) 325 MG tablet Take 1.5 tablets (487.5 mg total) by mouth 2 (two) times daily.    albuterol (VENTOLIN HFA) 108 (90 Base) MCG/ACT inhaler Inhale 1-2 puffs into the lungs every 6 (six) hours as needed (COPD).    Ascorbic Acid (VITAMIN C) 1000 MG tablet Take 1,000 mg by mouth daily.    aspirin 81 MG EC tablet Take 81 mg by mouth daily. 06/18/2021: Hold   buPROPion (WELLBUTRIN SR) 150 MG 12 hr tablet Take 1 tablet (150 mg total) by mouth at bedtime.    carvedilol (COREG) 6.25 MG tablet Take 1 tablet (6.25 mg total) by mouth in the morning and at bedtime.    Cholecalciferol (VITAMIN D) 125 MCG (5000 UT) CAPS Take 5,000 Units by mouth daily.    dapagliflozin propanediol (FARXIGA) 5 MG TABS tablet Take 1 tablet (5 mg total) by mouth daily before breakfast.    dorzolamide-timolol (COSOPT) 22.3-6.8 MG/ML ophthalmic solution Place 1 drop into both eyes 2 (two) times daily.    fenofibrate (TRICOR) 48 MG tablet TAKE 1 TABLET BY MOUTH EVERY DAY    gabapentin (NEURONTIN) 100 MG capsule Take 1 capsule (100 mg total) by mouth 2 (two) times daily.    hydrALAZINE (APRESOLINE) 10 MG tablet TAKE 1 TABLET (10 MG TOTAL) BY MOUTH EVERY 12 (TWELVE) HOURS.    isosorbide mononitrate (IMDUR) 30 MG 24 hr tablet Take 0.5 tablets (15 mg total) by mouth daily.    ketoconazole (NIZORAL) 2 % cream Apply 1 application topically daily as needed for irritation.    nitroGLYCERIN (NITROSTAT) 0.4 MG SL tablet Place 1 tablet (0.4 mg total) under the tongue every 5 (five) minutes as needed for chest pain. Up to 3 doses    oxyCODONE (OXY IR/ROXICODONE) 5 MG immediate release tablet Take 1 tablet (5 mg total) by mouth every 6 (six) hours as needed for severe pain ((score 7 to 10)).     Polyethyl Glycol-Propyl Glycol (SYSTANE) 0.4-0.3 % GEL ophthalmic gel Place 1 application into the left eye daily as needed (Dry eyes).    rivaroxaban (XARELTO) 20 MG TABS tablet Take 1 tablet (20 mg total) by mouth daily with supper.    rosuvastatin (CRESTOR) 20 MG tablet Take 1 tablet (20 mg total) by mouth daily.    traZODone (DESYREL) 100 MG tablet TAKE 2 TABLETS BY MOUTH AT BEDTIME    No facility-administered encounter medications on file as of 10/08/2021.    Unsuccessful attempt to contact Nathan Fernandez to remind him of his upcoming telephone visit with Debbora Dus on 10/15/2021 at 8:30 AM. A message was left reminding him to have all medications, supplements and any blood glucose and blood pressure readings available for review at appointment.  Star Rating Drugs: Medication:  Last Fill: Day Supply Rosuvastatin 20mg  10/01/2021 30   Farxiga 5mg   06/27/2021 Quantity 1 - Verified with [facility]       Debbora Dus, CPP notified  Marijean Niemann, [place]  Time Spent: 10 Minutes

## 2021-10-15 ENCOUNTER — Telehealth: Payer: Medicare Other

## 2021-10-15 NOTE — Progress Notes (Deleted)
Chronic Care Management Pharmacy Note  10/15/2021 Name:  Nathan Fernandez MRN:  [id number] DOB:  Nov 27, 1941  Subjective: Nathan Fernandez is an 80 y.o. year old male who is a primary patient of Damita Dunnings, Elveria Rising, MD.  The CCM team was consulted for assistance with disease management and care coordination needs.    Engaged with patient by telephone for follow up visit in response to provider referral for pharmacy case management and/or care coordination services.   Consent to Services:  The patient was given information about Chronic Care Management services, agreed to services, and gave verbal consent prior to initiation of services.  Please see initial visit note for detailed documentation.   Patient Care Team: Tonia Ghent, MD as PCP - General (Family Medicine) Josue Hector, MD as PCP - Cardiology (Cardiology) Josue Hector, MD as Consulting Physician (Cardiology) Debbora Dus, [facility] as Pharmacist (Pharmacist)  Office Visits: 02/21/21: PCP - NSTEMI, with plan for medical management at the catheterization.  No chest pain.  Not short of breath.  Still on carvedilol, fenofibrate, hydralazine, isosorbide, Crestor.  Already anticoagulated with Xarelto.  Reasonable to continue those medications for now.  Start tramadol for pain. 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 CAD - He cannot take ACE inhibitor/ARB or ARNI due to allergy. Continue aspirin, carvedilol, isosorbide, pravastatin. 05/02/20: Cardiology - EF 35-40 % 03/2020, Start hydralazine 10 mg BID.  Hospital  visits: 02/11/21-02/14/21 - NSTEMI, left heart cath 02/13/21, aspirin added to Xarelto regimen, started Tricor 01/09/21 - Lightheadedness, hemoptyosis  Objective:  Lab Results  Component Value Date   CREATININE 0.87 06/17/2021   BUN 16 06/17/2021   GFR 82.40 02/21/2021   GFRNONAA >60 06/17/2021   GFRAA 79 01/09/2021   NA 140 06/17/2021   K 4.4 06/17/2021   CALCIUM 9.1 06/17/2021   CO2 27 06/17/2021   GLUCOSE 154 (H) 06/17/2021    Lab Results  Component Value Date/Time   HGBA1C 7.7 (H) 06/17/2021 10:41 AM   HGBA1C 7.9 (H) 02/12/2021 06:22 AM   GFR 82.40 02/21/2021 04:42 PM   GFR 74.47 08/31/2020 09:25 AM   MICROALBUR 4.7 (H) 11/25/2010 09:12 AM   MICROALBUR 2.9 (H) 01/31/2008 10:39 AM    Last diabetic Eye exam: none per chart  Last diabetic Foot exam: 09/17/20     Lab Results  Component Value Date   CHOL 87 02/13/2021   HDL 33 (L) 02/13/2021   LDLCALC 29 02/13/2021   LDLDIRECT 64.0 08/31/2020   TRIG 126 02/13/2021   CHOLHDL 2.6 02/13/2021    Hepatic Function Latest Ref Rng & Units 02/14/2021 02/13/2021 02/11/2021  Total Protein 6.5 - 8.1 g/dL 5.7(L) 5.5(L) 6.7  Albumin 3.5 - 5.0 g/dL 2.9(L) 2.8(L) 3.5  AST 15 - 41 U/L _0 ALT 0 - 44 U/L _1 Alk Phosphatase 38 - 126 U/L 47 41 53  Total Bilirubin 0.3 - 1.2 mg/dL 0.4 0.3 0.3  Bilirubin, Direct 0.0 - 0.3 mg/dL - - -    Lab Results  Component Value Date/Time  TSH 3.054 02/12/2021 06:22 AM   TSH 2.39 08/31/2018 11:56 AM   TSH 1.90 02/24/2013 08:26 AM    CBC Latest Ref Rng & Units 06/17/2021 02/21/2021 02/14/2021  WBC 4.0 - 10.5 K/uL 7.2 7.2 7.6  Hemoglobin 13.0 - 17.0 g/dL 16.3 14.1 14.5  Hematocrit 39.0 - 52.0 % 47.5 41.3 43.0  Platelets 150 - 400 K/uL 168 207.0 182    Lab Results  Component Value Date/Time   VD25OH 49 11/25/2010 09:38 PM    Clinical ASCVD: Yes  The ASCVD Risk score (Arnett DK, et al., 2019) failed to calculate for the following reasons:   The 2019 ASCVD risk score is only valid  for ages 62 to 62   The patient has a prior MI or stroke diagnosis    Depression screen [facility] 2/9 08/31/2020 08/30/2019 08/24/2018  Decreased Interest 0 0 0  Down, Depressed, Hopeless 0 0 0  PHQ - 2 Score 0 0 0  Altered sleeping 0 0 0  Tired, decreased energy 0 0 0  Change in appetite 0 0 0  Feeling bad or failure about yourself  0 0 0  Trouble concentrating 0 0 0  Moving slowly or fidgety/restless 0 0 0  Suicidal thoughts 0 0 0  PHQ-9 Score 0 0 0  Difficult doing work/chores Not difficult at all - Not difficult at all    Social History   Tobacco Use  Smoking Status Former   Types: Cigars  Smokeless Tobacco Never  Tobacco Comments   cigar occassionally   BP Readings from Last 3 Encounters:  06/26/21 (!) 141/95  06/17/21 (!) 142/89  05/13/21 128/60   Pulse Readings from Last 3 Encounters:  06/26/21 74  06/17/21 70  05/13/21 70   Wt Readings from Last 3 Encounters:  06/19/21 175 lb (79.4 kg)  06/17/21 177 lb 8 oz (80.5 kg)  05/13/21 175 lb (79.4 kg)   BMI Readings from Last 3 Encounters:  06/19/21 25.84 kg/m  06/17/21 26.21 kg/m  05/13/21 25.84 kg/m   Echocardiogram 02/12/2021 - LVEF of 45-50%  Assessment/Interventions: Review of patient past medical history, allergies, medications, health status, including review of consultants reports, laboratory and other test data, was performed as part of comprehensive evaluation and provision of chronic care management services.   SDOH:  (Social Determinants of Health) assessments and interventions performed: Yes     SDOH Screenings   Alcohol Screen: Not on file  Depression (PHQ2-9): Not on file  Financial Resource Strain: High Risk   Difficulty of Paying Living Expenses: Hard  Food Insecurity: Not on file  Housing: Not on file  Physical Activity: Not on file  Social Connections: Not on file  Stress: Not on file  Tobacco Use: Medium Risk   Smoking Tobacco Use: Former   Smokeless Tobacco Use: Never   Passive Exposure:  Not on file  Transportation Needs: Not on file    [place]  Allergies  Allergen Reactions   Ace Inhibitors Swelling    Swelling of the tongue   Angiotensin Receptor Blockers Swelling    Tongue swelling   Enalapril Swelling    Tongue swelling    Medications Reviewed Today     Reviewed by Horatio Pel, RN (Registered Nurse) on 06/19/21 at [place] List Status: Complete   Medication Order Taking? Sig Documenting Provider Last Dose Status Informant  acetaminophen (TYLENOL) 325 MG tablet [id number] Yes Take 2 tablets (650 mg total) by mouth 2 (two) times daily as needed.  Patient taking differently: Take 500 mg by mouth 2 (two) times daily.   Tonia Ghent, MD Past Month Active Spouse/Significant Other  albuterol (VENTOLIN HFA) 108 (90 Base) MCG/ACT inhaler [id number] Yes Inhale 1-2 puffs into the lungs every 6 (six) hours as needed for shortness of breath.  Patient taking differently: Inhale 1-2 puffs into the lungs every 6 (six) hours as needed (COPD).   Tonia Ghent, MD Past Month Active   Ascorbic Acid (VITAMIN C) 1000 MG tablet [id number] Yes Take 1,000 mg by mouth daily. [provider] Past Week Active Spouse/Significant Other  aspirin 81 MG EC tablet [id number] Yes Take 81 mg by mouth daily. [provider] Past Week Active Spouse/Significant Other           Med Note Gentry Roch   Tue Jun 18, 2021 11:26 AM) Hold  benzonatate (TESSALON) 200 MG capsule [id number] No Take 1 capsule (200 mg total) by mouth 3 (three) times daily as needed for cough.  Patient not taking: Reported on 06/18/2021   Tonia Ghent, MD Not Taking Consider Medication Status and Discontinue (Patient Preference) Spouse/Significant Other  buPROPion [facility] SR) 150 MG 12 hr tablet [id number] Yes Take 1 tablet (150 mg total) by mouth daily.  Patient taking differently: Take 150 mg by mouth at bedtime.   Tonia Ghent, MD Past Week Active   carvedilol (COREG) 6.25 MG  tablet [id number] Yes Take 1 tablet (6.25 mg total) by mouth in the morning and at bedtime. Lonn Georgia, PA-C 06/19/2021 Active Spouse/Significant Other  Cholecalciferol (VITAMIN D) 125 MCG (5000 UT) CAPS [id number] Yes Take 5,000 Units by mouth daily. [provider] Past Week Active Spouse/Significant Other  cyclobenzaprine (FLEXERIL) 5 MG tablet [id number] Yes Take 1 tablet (5 mg total) by mouth 3 (three) times daily as needed for muscle spasms. Tonia Ghent, MD 06/18/2021 Active Spouse/Significant Other  dapagliflozin propanediol (FARXIGA) 5 MG TABS tablet [id number] Yes Take 1 tablet (5 mg total) by mouth daily before breakfast. Tonia Ghent, MD 06/18/2021 Active Spouse/Significant Other  dorzolamide-timolol (COSOPT) 22.3-6.8 MG/ML ophthalmic solution [id number] Yes Place 1 drop into both eyes 2 (two) times daily. [provider] 06/18/2021 Active Spouse/Significant Other  ELDERBERRY PO [id number] Yes Take 1,250 mg by mouth 2 (two) times daily. [provider] Past Week Active Spouse/Significant Other  fenofibrate (TRICOR) 48 MG tablet [id number] Yes Take 1 tablet (48 mg total) by mouth daily. Barrett, Felisa Bonier 06/18/2021 Active Spouse/Significant Other  gabapentin (NEURONTIN) 100 MG capsule [id number] Yes Take 1-2 capsules (100-200 mg total) by mouth 3 (three) times daily as needed (for pain.  sedation caution).  Patient taking differently: Take 100 mg by mouth 2 (two) times daily.   Tonia Ghent, MD 06/18/2021 Active Spouse/Significant Other  hydrALAZINE (APRESOLINE) 10 MG tablet [id number] Yes TAKE 1 TABLET (10 MG TOTAL) BY MOUTH EVERY 12 (TWELVE) HOURS. Richardson Dopp T, [place] 06/18/2021 Active Spouse/Significant Other  isosorbide mononitrate (IMDUR) 30 MG 24 hr tablet [id number] Yes TAKE 1/2 TABLET BY MOUTH EVERY DAY  Patient taking differently: Take 15 mg by mouth daily.   Josue Hector, MD 06/18/2021 Active Spouse/Significant Other  ketoconazole (NIZORAL) 2  % cream [id number] Yes Apply 1 application topically daily as needed for irritation. Tonia Ghent, MD Past Month Active Spouse/Significant Other  nitroGLYCERIN (NITROSTAT) 0.4 MG SL tablet [id number] Yes Place 1 tablet (0.4 mg total) under the tongue every 5 (five) minutes as needed for chest pain. Up to 3 doses  Josue Hector, MD  Active Spouse/Significant Other  Polyethyl Glycol-Propyl Glycol (SYSTANE) 0.4-0.3 % GEL ophthalmic gel [id number] Yes Place 1 application into the left eye daily as needed (Dry eyes). [provider] Past Week Active Spouse/Significant Other  rosuvastatin (CRESTOR) 20 MG tablet [id number] Yes Take 1 tablet (20 mg total) by mouth daily. Tonia Ghent, MD 06/19/2021 Active Spouse/Significant Other  traZODone (DESYREL) 100 MG tablet [id number] Yes TAKE 2 TABLETS (200 MG TOTAL) BY MOUTH AT BEDTIME. Tonia Ghent, MD 06/18/2021 Active Spouse/Significant Other  XARELTO 20 MG TABS tablet [id number] Yes TAKE 1 TABLET BY MOUTH DAILY WITH SUPPER  Patient taking differently: Take 20 mg by mouth daily with supper.   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   Hemoptysis 07/10/2019   Abnormal cardiovascular stress test    Tremor 09/02/2018   Advance care planning 09/02/2018   Cold intolerance 09/02/2018   Dry mouth 03/12/2018   Shoulder pain 01/05/2018   Hypoxia 12/29/2017   Wrist pain 12/29/2017   Lower back pain 02/18/2017   Lower urinary tract symptoms (LUTS) 02/11/2016    Bilateral knee pain 05/25/2015   Benign paroxysmal positional vertigo 02/18/2015   Abdominal wall symptom 02/18/2015   Gout 05/04/2014   Healthcare maintenance 04/18/2014   COPD (chronic obstructive pulmonary disease) ([place]) 03/25/2013   CAD (coronary artery disease) 03/03/2013   Other malaise and fatigue 02/22/2013   Chest wall pain 05/31/2012   Heartburn 05/07/2011   Depression 11/28/2010   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   NICOTINE ADDICTION 06/04/2010   Diabetes mellitus ([place]) 03/18/2010   SCIATICA, 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   HYPERCHOLESTEROLEMIA 10/19/2007   ERECTILE DYSFUNCTION 10/19/2007   HYPERTENSION, BENIGN 10/19/2007   ROSACEA 10/19/2007   Osteoarthritis 10/19/2007   Insomnia 10/19/2007    Immunization History  Administered Date(s) Administered   Influenza Split 01/07/2012   Influenza,inj,Quad PF,6+ Mos 02/08/2016   PFIZER(Purple Top)SARS-COV-2 Vaccination 02/13/2020, 03/05/2020   Pneumococcal Conjugate-13 02/08/2016   Pneumococcal Polysaccharide-23 09/07/2020   Td 09/01/2001    Conditions to be addressed/monitored:  Hypertension, Hyperlipidemia and Diabetes  Patient Care Plan: CCM Pharmacy Care Plan     Problem Identified: CHL AMB "PATIENT-SPECIFIC PROBLEM"      Long-Range Goal: [place]   Start Date: 04/15/2021  Priority: High  Note:   Current Barriers:  Pain uncontrolled  Pharmacist Clinical Goal(s):  Patient will contact provider office for questions/concerns as evidenced notation of same in electronic health record through collaboration with PharmD and provider.   Interventions: 1:1 collaboration with Tonia Ghent, MD regarding development and update of comprehensive plan of care as evidenced by provider attestation and co-signature Inter-disciplinary care team collaboration (see longitudinal plan of care) Comprehensive medication review performed; medication list updated in electronic medical  record  Hypertension/CAD (BP goal <140/90) -Controlled - per home and clinic readings -Current treatment: Carvedilol 6.25 mg - 1/2 tablet twice daily Hydralazine 10  mg - 1 tablet twice daily  Isosorbide mononitrate 30 mg - 1/2 tablet daily -Medications previously tried: ACE/ARB allergy -Current home readings: checks occasionally, last check 130/80, usually mid 120s/70s.  -Current dietary habits: No changes, pt reports he is eating smaller portions, maintained weight loss over the past 9 months, current weight 165 lbs to 170 lbs, reports he does not use a whole lot of salt. Drinks sprite daily.  -Current exercise habits: minimal, just walking around the house. Limited by back pain -Denies chest pain -Denies hypotensive/hypertensive symptoms -Educated on BP goals and benefits of medications for prevention of heart attack, stroke and kidney damage; Limit salty six  -Recommend monitor BP at home monthly, document, and provide log at future appointments -Recommended to continue current medication  Hyperlipidemia/CAD: (LDL goal < 100) -Controlled - LDL 23 -Current treatment: Rosuvastatin 20 mg - 1 tablet daily  Fenofibrate 48 mg - 1 tablet daily  Aspirin 81 mg - 1 tablet daily  -Medications previously tried: pt confirmed off pravastatin and gemfibrozil   -Recommended to continue current medication  Diabetes (A1c goal <8%) -Controlled - A1c 7.9% (Farxiga started after last A1c) -Current medications: Farxiga 5 mg - 1 tablet daily  -Medications previously tried: metformin (2010-2013)  -Denies any side effects with [place]. Does not have a BG meter -Current home glucose readings:  fasting glucose: none post prandial glucose: none -Denies hypoglycemic/hyperglycemic symptoms -Educated on A1c and blood sugar goals; Prevention and management of hypoglycemic episodes; -Counseled to check feet daily and get yearly eye exams -Recommended to continue current medication  COPD (Goal: control  symptoms and prevent exacerbations) -Controlled - per patient report -Current treatment  Albuterol - 1-2 puffs every 6 hours PRN Benzonatate 200 mg - 1 capsule TID PRN Azelastine - PRN -Medications previously tried: none -Pulmonary function testing: none per chart -Exacerbations requiring treatment in last 6 months: none -Frequency of rescue inhaler use: once/week - increased lately due to allergies -Pt reports cough improved but still having some congestion/post nasal drip.  -Counseled on When to use rescue inhaler -Recommended to continue current medication; Request refills - benzonatate, albuterol  Pain (Goal: Control symptoms) -Uncontrolled - reports back pain is not helped by tramadol -Current treatment  Tramadol 50 mg - 1 tablet every 4-6 hours as needed Tylenol Arthritis 8 hour - 2 tablets in morning and 2 at bedtime Flexeril 5 mg - 1 tablet up to three times a day  -Medications previously tried: none, did not try lidocaine patch -Non-pharm: Uses heat -Appointment next Friday with ortho Apr 24, 2021 -Takes 2 Tylenol around 6 AM, this helps some. Reports tramadol does not help.  -Reports pain is all day long -Scale: first thing in the morning 9/10, pain improves some once moved around. -Recommended to continue current medication; May take an additional 1000 mg Tylenol around lunch. Consider lidocaine patch - will consult PCP for alternatives.  Embolism Splenic Artery Occurred 2021 - indefinite anticoagulant recommended GFR > 60, CBC Stable -Current treatment  Xarelto 20 mg - 1 tablet daily  -Recommended to continue current medication -Patient to provide out of pocket expense report for prescriptions to proceed with PAP   Patient Goals/Self-Care Activities Patient will:  - collaborate with provider on medication access solutions  Follow Up Plan: Telephone follow up appointment with care management team member scheduled for: 6 months      There are no care plans that  you recently modified to display for this patient.   Medication Assistance: Application for Xarelto  medication assistance program. in process.  Anticipated assistance start date TBD - pending patient reaching 4% out of pocket requirement.  See plan of care for additional detail.  Star Rating Drugs: Medication:                Last Fill:         Day Supply Farxiga 44m.                03/25/21              30ds Rosuvastatin 23m   03/13/21  30ds  [id number] [id number]: [phone]: [phone] pill box? Yes - wife helps fill box Pt endorses 100% compliance  We discussed: Benefits of medication synchronization, packaging and delivery as well as enhanced pharmacist oversight with [employer]. Patient decided to: Continue current medication management strategy  Care Plan and Follow Up Patient Decision:  Patient agrees to Care Plan and Follow-up.  MicDebbora DusharmD Clinical Pharmacist [phone]

## 2021-10-15 NOTE — Progress Notes (Signed)
Carelink Summary Report / Loop Recorder 

## 2021-10-20 ENCOUNTER — Other Ambulatory Visit: Payer: Self-pay | Admitting: Cardiovascular Disease

## 2021-10-20 ENCOUNTER — Other Ambulatory Visit: Payer: Self-pay | Admitting: Family Medicine

## 2021-10-22 NOTE — Telephone Encounter (Signed)
Received a call from patients spouse, ok per DPR, requesting a refill on Coreg.   Chart review.   Rx(s) sent to pharmacy electronically.

## 2021-10-23 ENCOUNTER — Other Ambulatory Visit: Payer: Self-pay | Admitting: Cardiovascular Disease

## 2021-10-28 ENCOUNTER — Telehealth: Payer: Self-pay

## 2021-10-28 ENCOUNTER — Other Ambulatory Visit: Payer: Self-pay | Admitting: Family Medicine

## 2021-10-28 MED ORDER — CARVEDILOL 6.25 MG PO TABS: 6.2500 mg | ORAL_TABLET | Freq: Two times a day (BID) | ORAL | 2 refills | Status: DC

## 2021-10-28 NOTE — Telephone Encounter (Signed)
Received a call from patients wife stating she called last week requesting a refill on Coreg.   Reviewed chart.   Rx was sent but did not go through.   Rx(s) resent to pharmacy electronically.

## 2021-10-30 ENCOUNTER — Other Ambulatory Visit: Payer: Self-pay

## 2021-10-30 ENCOUNTER — Telehealth: Payer: Self-pay | Admitting: Family Medicine

## 2021-10-30 MED ORDER — CYCLOBENZAPRINE HCL 5 MG PO TABS: 5.0000 mg | ORAL_TABLET | Freq: Three times a day (TID) | ORAL | 2 refills | Status: DC | PRN

## 2021-10-30 NOTE — Telephone Encounter (Signed)
Sent. Thanks.  Have him update [place] if not better.

## 2021-10-30 NOTE — Telephone Encounter (Signed)
Patient requesting refill on cyclobenzaprine 5 mg tablets; rx in chart under historical.  LOV - 02/21/21 NOV - not scheduled RF -  looks like given on 08/04/21?

## 2021-10-30 NOTE — Telephone Encounter (Signed)
  Encourage patient to contact the pharmacy for refills or they can request refills through [place]:  Please schedule appointment if longer than 1 year  NEXT APPOINTMENT DATE:  MEDICATION:cyclobenzaprine 5mg   Is the patient out of medication? yes  [facility]  [address]  Let patient know to contact pharmacy at the end of the day to make sure medication is ready.  Please notify patient to allow 48-72 hours to process  CLINICAL FILLS OUT ALL BELOW:   LAST REFILL:  QTY:  REFILL DATE:    OTHER COMMENTS:    Okay for refill?  Please advise

## 2021-10-30 NOTE — Addendum Note (Signed)
Addended by: Tonia Ghent on: 10/30/2021 06:21 PM   Modules accepted: Orders

## 2021-10-31 NOTE — Telephone Encounter (Signed)
Left message on VM that Rx was sent.

## 2021-11-11 ENCOUNTER — Ambulatory Visit (INDEPENDENT_AMBULATORY_CARE_PROVIDER_SITE_OTHER): Payer: Medicare Other

## 2021-11-11 DIAGNOSIS — I639 Cerebral infarction, unspecified: Secondary | ICD-10-CM | POA: Diagnosis not present

## 2021-11-12 LAB — CUP PACEART REMOTE DEVICE CHECK
Date Time Interrogation Session: 20221114230346
Implantable Pulse Generator Implant Date: 20210525

## 2021-11-19 NOTE — Progress Notes (Signed)
Carelink Summary Report / Loop Recorder 

## 2021-12-10 LAB — CUP PACEART REMOTE DEVICE CHECK
Date Time Interrogation Session: 20221217230615
Implantable Pulse Generator Implant Date: 20210525

## 2021-12-17 ENCOUNTER — Ambulatory Visit (INDEPENDENT_AMBULATORY_CARE_PROVIDER_SITE_OTHER): Payer: Medicare Other

## 2021-12-17 ENCOUNTER — Telehealth: Payer: Self-pay | Admitting: Family Medicine

## 2021-12-17 DIAGNOSIS — I639 Cerebral infarction, unspecified: Secondary | ICD-10-CM

## 2021-12-17 MED ORDER — BUPROPION HCL ER (SR) 150 MG PO TB12: 150.0000 mg | ORAL_TABLET | Freq: Every day | ORAL | 2 refills | Status: DC

## 2021-12-17 NOTE — Telephone Encounter (Signed)
Erx sent

## 2021-12-17 NOTE — Telephone Encounter (Signed)
°  Encourage patient to contact the pharmacy for refills or they can request refills through [place]:  Please schedule appointment if longer than 1 year  NEXT APPOINTMENT DATE:  MEDICATION:buPROPion (WELLBUTRIN SR) 150 MG 12 hr tablet   Is the patient out of medication?   PHARMACY:Preferred Pharmacies     [id number] [place] [place] [facility] [address]      Let patient know to contact pharmacy at the end of the day to make sure medication is ready.  Please notify patient to allow 48-72 hours to process  CLINICAL FILLS OUT ALL BELOW:   LAST REFILL:  QTY:  REFILL DATE:    OTHER COMMENTS:    Okay for refill?  Please advise

## 2021-12-22 DIAGNOSIS — I82409 Acute embolism and thrombosis of unspecified deep veins of unspecified lower extremity: Secondary | ICD-10-CM

## 2021-12-22 HISTORY — DX: Acute embolism and thrombosis of unspecified deep veins of unspecified lower extremity: I82.409

## 2021-12-26 NOTE — Progress Notes (Signed)
Carelink Summary Report / Loop Recorder 

## 2022-01-04 ENCOUNTER — Other Ambulatory Visit: Payer: Self-pay

## 2022-01-04 ENCOUNTER — Emergency Department (HOSPITAL_BASED_OUTPATIENT_CLINIC_OR_DEPARTMENT_OTHER)
Admission: EM | Admit: 2022-01-04 | Discharge: 2022-01-04 | Disposition: A | Discharge status: Home / Self Care | Payer: Medicare Other | Attending: Emergency Medicine | Admitting: Emergency Medicine

## 2022-01-04 ENCOUNTER — Encounter (HOSPITAL_BASED_OUTPATIENT_CLINIC_OR_DEPARTMENT_OTHER): Payer: Self-pay | Admitting: Emergency Medicine

## 2022-01-04 ENCOUNTER — Emergency Department (HOSPITAL_BASED_OUTPATIENT_CLINIC_OR_DEPARTMENT_OTHER): Payer: Medicare Other

## 2022-01-04 ENCOUNTER — Other Ambulatory Visit: Payer: Self-pay | Admitting: Family Medicine

## 2022-01-04 DIAGNOSIS — M79662 Pain in left lower leg: Secondary | ICD-10-CM | POA: Diagnosis not present

## 2022-01-04 DIAGNOSIS — Z7982 Long term (current) use of aspirin: Secondary | ICD-10-CM | POA: Insufficient documentation

## 2022-01-04 DIAGNOSIS — M79605 Pain in left leg: Secondary | ICD-10-CM

## 2022-01-04 DIAGNOSIS — I82412 Acute embolism and thrombosis of left femoral vein: Secondary | ICD-10-CM

## 2022-01-04 DIAGNOSIS — Z7901 Long term (current) use of anticoagulants: Secondary | ICD-10-CM | POA: Insufficient documentation

## 2022-01-04 MED ORDER — RIVAROXABAN (XARELTO) VTE STARTER PACK (15 & 20 MG): ORAL_TABLET | ORAL | 0 refills | Status: DC

## 2022-01-04 MED ORDER — OXYCODONE-ACETAMINOPHEN 5-325 MG PO TABS: 1.0000 | ORAL_TABLET | Freq: Four times a day (QID) | ORAL | 0 refills | Status: DC | PRN

## 2022-01-04 NOTE — ED Provider Notes (Signed)
[facility] EMERGENCY DEPARTMENT Provider Note   CSN: [id number] Arrival date & time: 01/04/22  1309     History  Chief Complaint  Patient presents with   Leg Pain    Nathan Fernandez is a 81 y.o. male.  He is here with a complaint of left lower leg pain that started 2 days ago.  He does not recall any trauma.  Been getting progressively worse and keeping him up at night.  No chest pain or shortness of breath.  No numbness or weakness.  He is not on any blood thinners other than a baby aspirin.  The history is provided by the patient.  Leg Pain Location:  Leg Time since incident:  2 days Injury: no   Leg location:  L lower leg Pain details:    Quality:  Aching   Radiates to:  Does not radiate   Severity:  Severe   Onset quality:  Gradual   Duration:  2 days   Timing:  Constant   Progression:  Worsening Chronicity:  New Relieved by:  Nothing Worsened by:  Bearing weight Ineffective treatments:  Acetaminophen and muscle relaxant Associated symptoms: no fever, no neck pain, no numbness and no tingling       Home Medications Prior to Admission medications   Medication Sig Start Date End Date Taking? Authorizing Provider  acetaminophen (TYLENOL) 325 MG tablet Take 1.5 tablets (487.5 mg total) by mouth 2 (two) times daily. 06/21/21   Kary Kos, MD  albuterol (VENTOLIN HFA) 108 (90 Base) MCG/ACT inhaler Inhale 1-2 puffs into the lungs every 6 (six) hours as needed (COPD). 06/22/21   Consuella Lose, MD  Ascorbic Acid (VITAMIN C) 1000 MG tablet Take 1,000 mg by mouth daily.    [provider]  aspirin 81 MG EC tablet Take 81 mg by mouth daily.    [provider]  buPROPion (WELLBUTRIN SR) 150 MG 12 hr tablet Take 1 tablet (150 mg total) by mouth at bedtime. 12/17/21   Tonia Ghent, MD  carvedilol (COREG) 6.25 MG tablet Take 1 tablet (6.25 mg total) by mouth 2 (two) times daily. 10/28/21   Josue Hector, MD  Cholecalciferol (VITAMIN D) 125 MCG  (5000 UT) CAPS Take 5,000 Units by mouth daily.    [provider]  cyclobenzaprine (FLEXERIL) 5 MG tablet Take 1 tablet (5 mg total) by mouth 3 (three) times daily as needed. 10/30/21   Tonia Ghent, MD  dapagliflozin propanediol (FARXIGA) 5 MG TABS tablet Take 1 tablet (5 mg total) by mouth daily before breakfast. 02/23/21   Tonia Ghent, MD  dorzolamide-timolol (COSOPT) 22.3-6.8 MG/ML ophthalmic solution Place 1 drop into both eyes 2 (two) times daily. 02/13/20   [provider]  fenofibrate (TRICOR) 48 MG tablet TAKE 1 TABLET BY MOUTH EVERY DAY 08/15/21   Josue Hector, MD  gabapentin (NEURONTIN) 100 MG capsule Take 1 capsule (100 mg total) by mouth 2 (two) times daily. 06/21/21   Kary Kos, MD  hydrALAZINE (APRESOLINE) 10 MG tablet TAKE 1 TABLET (10 MG TOTAL) BY MOUTH EVERY 12 (TWELVE) HOURS. 03/27/21 03/27/22  Richardson Dopp T, PA-C  isosorbide mononitrate (IMDUR) 30 MG 24 hr tablet Take 0.5 tablets (15 mg total) by mouth daily. 06/21/21   Kary Kos, MD  ketoconazole (NIZORAL) 2 % cream Apply 1 application topically daily as needed for irritation. 04/16/21   Tonia Ghent, MD  nitroGLYCERIN (NITROSTAT) 0.4 MG SL tablet Place 1 tablet (0.4 mg total)  under the tongue every 5 (five) minutes as needed for chest pain. Up to 3 doses 05/13/21   Josue Hector, MD  oxyCODONE (OXY IR/ROXICODONE) 5 MG immediate release tablet Take 1 tablet (5 mg total) by mouth every 6 (six) hours as needed for severe pain ((score 7 to 10)). 06/26/21   Meyran, Ocie Cornfield, NP  Polyethyl Glycol-Propyl Glycol (SYSTANE) 0.4-0.3 % GEL ophthalmic gel Place 1 application into the left eye daily as needed (Dry eyes).    [provider]  rivaroxaban (XARELTO) 20 MG TABS tablet Take 1 tablet (20 mg total) by mouth daily with supper. 06/26/21   Consuella Lose, MD  rosuvastatin (CRESTOR) 20 MG tablet Take 1 tablet (20 mg total) by mouth daily. 01/16/21   Tonia Ghent, MD  traZODone (DESYREL) 100 MG  tablet TAKE 2 TABLETS BY MOUTH AT BEDTIME 08/30/21   Tonia Ghent, MD      Allergies    Ace inhibitors, Angiotensin receptor blockers, and Enalapril    Review of Systems   Review of Systems  Constitutional:  Negative for fever.  HENT:  Negative for sore throat.   Eyes:  Negative for visual disturbance.  Respiratory:  Negative for shortness of breath.   Cardiovascular:  Negative for chest pain.  Gastrointestinal:  Negative for abdominal pain.  Genitourinary:  Negative for dysuria.  Musculoskeletal:  Negative for neck pain.  Skin:  Negative for rash.  Neurological:  Negative for headaches.   Physical Exam Updated Vital Signs BP (!) 141/107    Pulse 77    Temp 97.9 F (36.6 C) (Oral)    Resp 18    Ht 5\' 9"  (1.753 m)    Wt 74.8 kg    SpO2 92%    BMI 24.37 kg/m  Physical Exam Vitals and nursing note reviewed.  Constitutional:      Appearance: Normal appearance. He is well-developed.  HENT:     Head: Normocephalic and atraumatic.  Eyes:     Conjunctiva/sclera: Conjunctivae normal.  Cardiovascular:     Rate and Rhythm: Normal rate and regular rhythm.  Pulmonary:     Effort: Pulmonary effort is normal.  Abdominal:     Tenderness: There is no abdominal tenderness. There is no guarding or rebound.  Musculoskeletal:        General: Tenderness present. Normal range of motion.     Cervical back: Neck supple.     Comments: He has tenderness in his lateral lower leg compartment.  It is soft and there is no overlying erythema or wounds.  Distal pulses intact.  Minimal calf tenderness.  No popliteal tenderness or proximal leg tenderness.  Skin:    General: Skin is warm and dry.  Neurological:     General: No focal deficit present.     Mental Status: He is alert.     GCS: GCS eye subscore is 4. GCS verbal subscore is 5. GCS motor subscore is 6.     Sensory: No sensory deficit.     Motor: No weakness.    ED Results / Procedures / Treatments   Labs (all labs ordered are listed,  but only abnormal results are displayed) Labs Reviewed - No data to display  EKG None  Radiology US Venous Img Lower Unilateral Left  Result Date: 01/04/2022 CLINICAL DATA:  Pain EXAM: Left LOWER EXTREMITY VENOUS DOPPLER ULTRASOUND TECHNIQUE: Gray-scale sonography with compression, as well as color and duplex ultrasound, were performed to evaluate the deep venous system(s) from the level  of the common femoral vein through the popliteal and proximal calf veins. COMPARISON:  None. FINDINGS: VENOUS Nonocclusive DVT is noted in the cephalad course of left femoral vein and in the deep femoral vein. Rest of the major deep veins in the left lower extremity are patent. Limited views of the contralateral common femoral vein are unremarkable. OTHER There is 5.5 x 3.3 x 2.9 cm complex mixed echogenic structure in the left popliteal fossa. This may suggest a complex popliteal cyst or a hematoma. There is enlarged lymph node with fatty hilum in the left inguinal region, possibly suggesting reactive hyperplasia of lymph node. Limitations: none IMPRESSION: Nonocclusive DVT is noted in the cephalad course of left femoral vein and in the left deep femoral vein. Rest of the major deep veins appear patent. There is 5.5 x 3.3 x 2.9 cm complex structure in the left popliteal fossa in subcutaneous plane. This may suggest complex popliteal cyst or hematoma. Electronically Signed   By: Elmer Picker M.D.   On: 01/04/2022 16:16    Procedures Procedures    Medications Ordered in ED Medications - No data to display  ED Course/ Medical Decision Making/ A&P Clinical Course as of 01/05/22 0940  Sat Jan 04, 2022  1631 Due to patient having nonocclusive DVT discussed with Dr. Stanford Breed from vascular surgery.  He did recommend starting the patient on a DOAC.  Would need treatment for 3 months.  Patient can follow-up with him or his primary care doctor. [MB]    Clinical Course User Index [MB] Hayden Rasmussen, MD                            Medical Decision Making This patient complains of left lower leg pain; this involves an extensive number of treatment Options and is a complaint that carries with it a high risk of complications and Morbidity. The differential includes musculoskeletal pain, compartment syndrome, muscle strain, hematoma, DVT  I ordered imaging studies which included ultrasound left lower leg and I independently    visualized and interpreted imaging which showed nonocclusive DVT and complex cyst or hematoma popliteal Additional history obtained from patient's wife Previous records obtained and reviewed in epic, patient was prior on Xarelto I consulted Dr. Stanford Breed vascular surgery and discussed lab and imaging findings  Critical Interventions: None  After the interventions stated above, I reevaluated the patient and found patient to be fairly asymptomatic at rest.  Reviewed recommendations from vascular with him.  We will restart on DOAC.  Recommended close follow-up with PCP.  Also given prescription for pain medication.  Counseled on side effects.  Return instructions discussed          Final Clinical Impression(s) / ED Diagnoses Final diagnoses:  Left leg pain  Acute deep vein thrombosis (DVT) of femoral vein of left lower extremity ([place])    Rx / DC Orders ED Discharge Orders          Ordered    RIVAROXABAN (XARELTO) VTE STARTER PACK (15 & 20 MG)        01/04/22 1639    oxyCODONE-acetaminophen (PERCOCET/ROXICET) 5-325 MG tablet  Every 6 hours PRN        01/04/22 1639              Hayden Rasmussen, MD 01/05/22 [phone]

## 2022-01-04 NOTE — ED Notes (Signed)
ED Provider at bedside. 

## 2022-01-04 NOTE — ED Notes (Signed)
Patient transported to Ultrasound. Unable to get vitals.

## 2022-01-04 NOTE — ED Notes (Signed)
Currently in US

## 2022-01-04 NOTE — Discharge Instructions (Signed)
You were seen in the emergency department for left calf pain.  You had an ultrasound that showed a blood clot in your left upper leg.  This is likely not a cause of your pain but he will need to go on blood thinners.  We are also prescribing some pain medication for your leg pain.  You can also try ice or heat to the affected area and gentle stretching.  Follow-up with your primary care doctor and Dr. Stanford Breed vascular surgery

## 2022-01-04 NOTE — ED Triage Notes (Signed)
Pt reports LLE pain since Thurs pm; no injury; pt denies swelling

## 2022-01-05 ENCOUNTER — Telehealth: Payer: Self-pay | Admitting: Family Medicine

## 2022-01-05 NOTE — Telephone Encounter (Signed)
Please check on patient.  Needs ER f/u when possible re: DVT.  Thanks.

## 2022-01-06 ENCOUNTER — Encounter: Payer: Self-pay | Admitting: Family Medicine

## 2022-01-06 ENCOUNTER — Other Ambulatory Visit: Payer: Self-pay

## 2022-01-06 ENCOUNTER — Telehealth: Payer: Self-pay

## 2022-01-06 ENCOUNTER — Ambulatory Visit (INDEPENDENT_AMBULATORY_CARE_PROVIDER_SITE_OTHER): Payer: Medicare Other | Admitting: Family Medicine

## 2022-01-06 VITALS — BP 112/62 | HR 72 | Temp 98.2°F | Ht 69.0 in | Wt 175.1 lb

## 2022-01-06 DIAGNOSIS — I82412 Acute embolism and thrombosis of left femoral vein: Secondary | ICD-10-CM | POA: Diagnosis not present

## 2022-01-06 MED ORDER — RIVAROXABAN 20 MG PO TABS: 20.0000 mg | ORAL_TABLET | Freq: Every day | ORAL | 1 refills | Status: DC

## 2022-01-06 NOTE — Telephone Encounter (Signed)
[facility] called, patient needs to be seen in 4 hours, experience left foot pain

## 2022-01-06 NOTE — Progress Notes (Signed)
Nathan Penza T. Kehinde Totzke, MD, CAQ Sports Medicine [facility] [address] [place], [address]  Phone: [phone]   FAX: [phone]  Nathan Fernandez - 81 y.o. male   MRN [id number]   Date of Birth: 04/23/1941  Date: 01/06/2022   PCP: Nathan Nam, MD   Referral: Nathan Nam, MD  Chief Complaint  Patient presents with   Foot Pain    Left-seen in ER on 01/04/22 for DVT in Groin   Leg Pain    Lower Left     This visit occurred during the SARS-CoV-2 public health emergency.  Safety protocols were in place, including screening questions prior to the visit, additional usage of staff PPE, and extensive cleaning of exam room while observing appropriate contact time as indicated for disinfecting solutions.   Subjective:   Nathan Fernandez is a 81 y.o. very pleasant male patient with Body mass index is 25.86 kg/m. who presents with the following:  Left groin and foot pain with known DVT on the L side dx 01/04/2022 in the ER.  ER follow-up, acute status post ER visit on January 04, 2022.  Patient presents with some ongoing leg swelling and pain after L femoral and a deep femoral vein DVT.  He continues to have pain and some swelling little bit more in the soft anterior of the lower extremity, and in the ankle.  History significant for prior clot around the kidney, and per the wife 2 total clots within the last year.    There is no recent COVID-vaccine or infection.  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   Hemoptysis 07/10/2019   Abnormal  cardiovascular stress test    Tremor 09/02/2018   Advance care planning 09/02/2018   Cold intolerance 09/02/2018   Dry mouth 03/12/2018   Shoulder pain 01/05/2018   Hypoxia 12/29/2017   Wrist pain 12/29/2017   Lower back pain 02/18/2017   Lower urinary tract symptoms (LUTS) 02/11/2016   Bilateral knee pain 05/25/2015   Benign paroxysmal positional vertigo 02/18/2015   Abdominal wall symptom 02/18/2015   Gout 05/04/2014   Healthcare maintenance 04/18/2014   COPD (chronic obstructive pulmonary disease) (HCC) 03/25/2013   CAD (coronary artery disease) 03/03/2013   Other malaise and fatigue 02/22/2013   Chest wall pain 05/31/2012   Heartburn 05/07/2011   Depression 11/28/2010   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   NICOTINE ADDICTION 06/04/2010   Diabetes mellitus (HCC) 03/18/2010   SCIATICA, 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   HYPERCHOLESTEROLEMIA 10/19/2007   ERECTILE DYSFUNCTION 10/19/2007   HYPERTENSION, BENIGN 10/19/2007   ROSACEA 10/19/2007   Osteoarthritis 10/19/2007   Insomnia 10/19/2007    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   Hosp R facial weakness   CAD (coronary artery disease)    MI, Hosp 1993, PTCA; LHC 04/18/13: Distal left main 40%, proximal LAD with several aneurysmal segments, proximal LAD 50% prior to and after aneurysmal segments, area does not appear to flow-limiting, proximal diagonal 70%, ostial circumflex 20%, proximal circumflex 20%, OM1 30-40%, proximal RCA 40%, mid RCA  30%, distal RCA 20%, EF 50% => med Rx   Cancer [facility])    skin cancer - 2020   COPD (chronic obstructive pulmonary disease) (HCC)    DM2 (diabetes mellitus, type 2) (HCC)    History of ETT 1998   wnl   History of hiatal hernia    History of MRI 08/04/2009   brain- atrophy sm vess dz   HLD (hyperlipidemia)    HTN (hypertension)    Myocardial infarction (HCC)    OA (osteoarthritis)    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    Past Surgical History:  Procedure Laterality Date   BUBBLE STUDY  05/15/2020   Procedure: BUBBLE STUDY;  Surgeon: Chrystie Nose, MD;  Location: [facility] ENDOSCOPY;  Service: Cardiovascular;;   CARDIAC CATHETERIZATION     cataract surgery  08/2005   repair lens which moved   EYE SURGERY     LEFT HEART CATH AND CORONARY ANGIOGRAPHY N/A 01/21/2019   Procedure: LEFT HEART CATH AND CORONARY ANGIOGRAPHY;  Surgeon: Kathleene Hazel, MD;  Location: [facility] INVASIVE CV LAB;  Service: Cardiovascular;  Laterality: N/A;   LEFT HEART CATH AND CORONARY ANGIOGRAPHY N/A 02/13/2021   Procedure: LEFT HEART CATH AND CORONARY ANGIOGRAPHY;  Surgeon: Iran Ouch, MD;  Location: [facility] INVASIVE CV LAB;  Service: Cardiovascular;  Laterality: N/A;   LOOP RECORDER INSERTION N/A 05/15/2020   Procedure: LOOP RECORDER INSERTION;  Surgeon: Hillis Range, MD;  Location: [facility] INVASIVE CV LAB;  Service: Cardiovascular;  Laterality: N/A;   LUMBAR LAMINECTOMY/DECOMPRESSION MICRODISCECTOMY N/A 06/19/2021   Procedure: Lumbar Two-Three, Lumbar Three-Four, Lumbar Four-Five Laminectomy and Foraminotomy;  Surgeon: Donalee Citrin, MD;  Location: [facility] OR;  Service: Neurosurgery;  Laterality: N/A;   stress myoview  06/29/2006   sm distal anteroseptal & apical infarct   TEE WITHOUT CARDIOVERSION N/A 05/15/2020   Procedure: TRANSESOPHAGEAL ECHOCARDIOGRAM (TEE);  Surgeon: Chrystie Nose, MD;  Location: [facility] ENDOSCOPY;  Service: Cardiovascular;  Laterality: N/A;   THYROIDECTOMY, PARTIAL  1967   B9 growth   TONSILLECTOMY      Family History  Problem Relation Age of Onset   Hypertension Mother    Aneurysm Mother    Stroke Mother    Hypertension Father    Heart disease Father        CAD   Heart disease Son        MI   Colon cancer Neg Hx    Prostate cancer Neg Hx      Objective:   BP 112/62    Pulse 72    Temp 98.2 F (36.8 C) (Temporal)    Ht 5\' 9"  (1.753 m)    Wt 175 lb 2 oz (79.4 kg)    SpO2 93%     BMI 25.86 kg/m   GEN: No acute distress; alert,appropriate. PULM: Breathing comfortably in no respiratory distress PSYCH: Normally interactive.    There is a popliteal cyst on the left.  It is nontender.     Laboratory and Imaging Data:  CLINICAL DATA:  Pain   EXAM: Left LOWER EXTREMITY VENOUS DOPPLER ULTRASOUND   TECHNIQUE: Gray-scale sonography with compression, as well as color and duplex ultrasound, were performed to evaluate the deep venous system(s) from the level of the common femoral vein through the popliteal and proximal calf veins.   COMPARISON:  None.   FINDINGS: VENOUS   Nonocclusive DVT is noted in the cephalad course of left femoral vein and in the  deep femoral vein. Rest of the major deep veins in the left lower extremity are patent.   Limited views of the contralateral common femoral vein are unremarkable.   OTHER   There is 5.5 x 3.3 x 2.9 cm complex mixed echogenic structure in the left popliteal fossa. This may suggest a complex popliteal cyst or a hematoma. There is enlarged lymph node with fatty hilum in the left inguinal region, possibly suggesting reactive hyperplasia of lymph node.   Limitations: none   IMPRESSION: Nonocclusive DVT is noted in the cephalad course of left femoral vein and in the left deep femoral vein. Rest of the major deep veins appear patent.   There is 5.5 x 3.3 x 2.9 cm complex structure in the left popliteal fossa in subcutaneous plane. This may suggest complex popliteal cyst or hematoma.     Electronically Signed   By: Ernie Avena M.D.   On: 01/04/2022 16:16    Assessment and Plan:     ICD-10-CM   1. Acute deep vein thrombosis (DVT) of femoral vein of left lower extremity (HCC)  I82.412 rivaroxaban (XARELTO) 20 MG TABS tablet     Total encounter time: 36 minutes. This includes total time spent on the day of encounter.  This includes fairly extensive chart review.  I discussed DVT in general  with the patient and his wife.  We reviewed the pathology and the vascular anatomy.  Baker's cyst is nontender, and inconsequential in this case.  Primary care doctor and other specialists will need to determine length of anticoagulation.  For now, I did refill his Xarelto so that he will have enough to last until their follow-up visits.  I will send a carbon copy of this note to his primary care doctor, and additional follow-up will be needed.  Meds ordered this encounter  Medications   rivaroxaban (XARELTO) 20 MG TABS tablet    Sig: Take 1 tablet (20 mg total) by mouth daily with supper.    Dispense:  30 tablet    Refill:  1    Refills after starter Xarelto pack   There are no discontinued medications. No orders of the defined types were placed in this encounter.   Follow-up: Return in about 2 weeks (around 01/20/2022).  Dragon [employer] One speech-to-text software was used for transcription in this dictation.  Possible transcriptional errors can occur using Animal nutritionist.   Signed,  Elpidio Galea. Treana Lacour, MD   Outpatient Encounter Medications as of 01/06/2022  Medication Sig   acetaminophen (TYLENOL) 325 MG tablet Take 1.5 tablets (487.5 mg total) by mouth 2 (two) times daily.   albuterol (VENTOLIN HFA) 108 (90 Base) MCG/ACT inhaler Inhale 1-2 puffs into the lungs every 6 (six) hours as needed (COPD).   Ascorbic Acid (VITAMIN C) 1000 MG tablet Take 1,000 mg by mouth daily.   aspirin 81 MG EC tablet Take 81 mg by mouth daily.   buPROPion (WELLBUTRIN SR) 150 MG 12 hr tablet Take 1 tablet (150 mg total) by mouth at bedtime.   carvedilol (COREG) 6.25 MG tablet Take 1 tablet (6.25 mg total) by mouth 2 (two) times daily.   Cholecalciferol (VITAMIN D) 125 MCG (5000 UT) CAPS Take 5,000 Units by mouth daily.   cyclobenzaprine (FLEXERIL) 5 MG tablet Take 1 tablet (5 mg total) by mouth 3 (three) times daily as needed.   dapagliflozin propanediol (FARXIGA) 5 MG TABS tablet Take 1 tablet (5  mg total) by mouth daily before breakfast.   dorzolamide-timolol (COSOPT) 22.3-6.8  MG/ML ophthalmic solution Place 1 drop into both eyes 2 (two) times daily.   fenofibrate (TRICOR) 48 MG tablet TAKE 1 TABLET BY MOUTH EVERY DAY   gabapentin (NEURONTIN) 100 MG capsule Take 1 capsule (100 mg total) by mouth 2 (two) times daily.   hydrALAZINE (APRESOLINE) 10 MG tablet TAKE 1 TABLET (10 MG TOTAL) BY MOUTH EVERY 12 (TWELVE) HOURS.   isosorbide mononitrate (IMDUR) 30 MG 24 hr tablet Take 0.5 tablets (15 mg total) by mouth daily.   ketoconazole (NIZORAL) 2 % cream Apply 1 application topically daily as needed for irritation.   nitroGLYCERIN (NITROSTAT) 0.4 MG SL tablet Place 1 tablet (0.4 mg total) under the tongue every 5 (five) minutes as needed for chest pain. Up to 3 doses   oxyCODONE (OXY IR/ROXICODONE) 5 MG immediate release tablet Take 1 tablet (5 mg total) by mouth every 6 (six) hours as needed for severe pain ((score 7 to 10)).   oxyCODONE-acetaminophen (PERCOCET/ROXICET) 5-325 MG tablet Take 1 tablet by mouth every 6 (six) hours as needed for severe pain.   Polyethyl Glycol-Propyl Glycol (SYSTANE) 0.4-0.3 % GEL ophthalmic gel Place 1 application into the left eye daily as needed (Dry eyes).   rivaroxaban (XARELTO) 20 MG TABS tablet Take 1 tablet (20 mg total) by mouth daily with supper.   RIVAROXABAN (XARELTO) VTE STARTER PACK (15 & 20 MG) Follow package directions: Take one 15mg  tablet by mouth twice a day. On day 22, switch to one 20mg  tablet once a day. Take with food.   rosuvastatin (CRESTOR) 20 MG tablet Take 1 tablet (20 mg total) by mouth daily.   traZODone (DESYREL) 100 MG tablet TAKE 2 TABLETS BY MOUTH AT BEDTIME   No facility-administered encounter medications on file as of 01/06/2022.

## 2022-01-06 NOTE — Telephone Encounter (Signed)
Pt has appt with Dr. Lorelei Fernandez, today 01/06/22, at [facility] note to PCP as an [place].    [facility] Day - Client TELEPHONE ADVICE RECORD [facility] Patient Name: Nathan Fernandez [place] Gender: Male DOB: 1941/07/24 Age: 81 Y 3 M 26 D Return Phone Number: [phone] (Primary), [phone] (Secondary) Address: City/ State/ Zip: [place]  [address] Client [place] Day - Client Client Site [facility] - Day Provider Nathan Fernandez - MD Contact Type Call Who Is Calling Patient / Member / Family / Caregiver Call Type Triage / Clinical Caller Name Nathan Fernandez Relationship To Patient Spouse Return Phone Number [phone] (Primary) Chief Complaint Leg Swelling And Edema Reason for Call Symptomatic / Request for [place] husband has a blood clot by his groin area and now his foot is swollen and has leg pain as well. Has a cyst on the back of his leg. Translation No Nurse Assessment Nurse: Nathan Lemmings, RN, Nathan Fernandez Date/Time Nathan Fernandez Time): 01/06/2022 12:00:03 PM Confirm and document reason for call. If symptomatic, describe symptoms. ---Callers husband has a blood clot by his groin area and now his foot is swollen and has leg pain as well, knee down to foot. Has a cyst on the back of his leg. He is back on the xarelto. Seen on Saturday. Dx with small clot to groin, US done in ER. Rx of pain meds, oxycodone. Does the patient have any new or worsening symptoms? ---Yes Will a triage be completed? ---Yes Related visit to physician within the last 2 weeks? ---Yes Does the PT have any chronic conditions? (i.e. diabetes, asthma, this includes High risk factors for pregnancy, etc.) ---Yes List chronic conditions. 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 Is this a behavioral health or substance abuse call?  ---No Guidelines Guideline Title Affirmed Question Affirmed Notes Nurse Date/Time Nathan Fernandez Time) Leg Swelling and Edema [1] Thigh or calf pain AND [2] only 1 side AND [3] present > 1 hour Etter Sjogren 01/06/2022 12:05:39 PM PLEASE NOTE: All timestamps contained within this report are represented as [place] Standard Time. CONFIDENTIALTY NOTICE: This fax transmission is intended only for the addressee. It contains information that is legally privileged, confidential or otherwise protected from use or disclosure. If you are not the intended recipient, you are strictly prohibited from reviewing, disclosing, copying using or disseminating any of this information or taking any action in reliance on or regarding this information. If you have received this fax in error, please notify us immediately by telephone so that we can arrange for its return to [place]. Phone: [phone], Toll-Free: [phone], Fax: [phone] Page: 2 of 2 Call Id: [id number] [place]. Time Nathan Fernandez Time) Disposition Final User 01/06/2022 12:09:55 PM See HCP within 4 Hours (or PCP triage) Yes Nathan Lemmings, RN, Lenon Oms Disagree/Comply Comply Caller Understands Yes PreDisposition Call Doctor Care Advice Given Per Guideline SEE HCP (OR PCP TRIAGE) WITHIN 4 HOURS: * IF OFFICE WILL BE OPEN: You need to be seen within the next 3 or 4 hours. Call your doctor (or NP/PA) now or as soon as the office opens. CALL EMS IF: * Chest pain or shortness of breath occurs. CARE ADVICE given per Leg Swelling and Edema (Adult) guideline. Referrals REFERRED TO PCP OFFIC

## 2022-01-06 NOTE — Telephone Encounter (Signed)
[facility] called, patient needs to be seen in 4 hours, experience left foot pain, no redness, no streaks, painful with and w/o touch  Scheduled with [facility] 1.16.23 at 2pm

## 2022-01-06 NOTE — Telephone Encounter (Signed)
Will await note from Dr. Lorelei Pont.  Thanks.

## 2022-01-08 ENCOUNTER — Encounter: Payer: Self-pay | Admitting: Family Medicine

## 2022-01-08 ENCOUNTER — Telehealth: Payer: Self-pay | Admitting: Family Medicine

## 2022-01-08 DIAGNOSIS — I82409 Acute embolism and thrombosis of unspecified deep veins of unspecified lower extremity: Secondary | ICD-10-CM

## 2022-01-08 NOTE — Telephone Encounter (Signed)
Please check with patient.  I saw the note from Dr. Lorelei Pont.  I think it makes sense to continue with the blood thinner for now assuming he is not having any bleeding.  If he needs a refill, then let me know.  Please set an office visit for [facility] next month, after we return to our old clinic.  I would not change anything else in the meantime based on the most recent note.  Thanks.

## 2022-01-09 NOTE — Telephone Encounter (Signed)
Okay to keep as is.  If he prefers [facility], then I would push it back to allow for that.  Thanks.

## 2022-01-09 NOTE — Telephone Encounter (Signed)
Patient has appt already set on 01/20/22; do you still want that moved?

## 2022-01-09 NOTE — Telephone Encounter (Signed)
Mail box full not able to leave message.

## 2022-01-10 ENCOUNTER — Other Ambulatory Visit: Payer: Self-pay | Admitting: Family Medicine

## 2022-01-13 ENCOUNTER — Other Ambulatory Visit: Payer: Self-pay | Admitting: Family Medicine

## 2022-01-13 DIAGNOSIS — E119 Type 2 diabetes mellitus without complications: Secondary | ICD-10-CM

## 2022-01-13 DIAGNOSIS — E7800 Pure hypercholesterolemia, unspecified: Secondary | ICD-10-CM

## 2022-01-13 DIAGNOSIS — M109 Gout, unspecified: Secondary | ICD-10-CM

## 2022-01-15 NOTE — Telephone Encounter (Signed)
Mail box full not able to leave message.

## 2022-01-17 DIAGNOSIS — D3132 Benign neoplasm of left choroid: Secondary | ICD-10-CM | POA: Diagnosis not present

## 2022-01-17 DIAGNOSIS — D3131 Benign neoplasm of right choroid: Secondary | ICD-10-CM | POA: Diagnosis not present

## 2022-01-17 DIAGNOSIS — H401131 Primary open-angle glaucoma, bilateral, mild stage: Secondary | ICD-10-CM | POA: Diagnosis not present

## 2022-01-17 DIAGNOSIS — H52203 Unspecified astigmatism, bilateral: Secondary | ICD-10-CM | POA: Diagnosis not present

## 2022-01-20 ENCOUNTER — Other Ambulatory Visit: Payer: Self-pay

## 2022-01-20 ENCOUNTER — Encounter: Payer: Self-pay | Admitting: Family Medicine

## 2022-01-20 ENCOUNTER — Ambulatory Visit (INDEPENDENT_AMBULATORY_CARE_PROVIDER_SITE_OTHER): Payer: Medicare Other

## 2022-01-20 ENCOUNTER — Ambulatory Visit (INDEPENDENT_AMBULATORY_CARE_PROVIDER_SITE_OTHER): Payer: Medicare Other | Admitting: Family Medicine

## 2022-01-20 DIAGNOSIS — I82409 Acute embolism and thrombosis of unspecified deep veins of unspecified lower extremity: Secondary | ICD-10-CM | POA: Diagnosis not present

## 2022-01-20 DIAGNOSIS — I639 Cerebral infarction, unspecified: Secondary | ICD-10-CM

## 2022-01-20 DIAGNOSIS — M109 Gout, unspecified: Secondary | ICD-10-CM

## 2022-01-20 DIAGNOSIS — E7800 Pure hypercholesterolemia, unspecified: Secondary | ICD-10-CM

## 2022-01-20 DIAGNOSIS — E119 Type 2 diabetes mellitus without complications: Secondary | ICD-10-CM | POA: Diagnosis not present

## 2022-01-20 LAB — CBC WITH DIFFERENTIAL/PLATELET
Basophils Absolute: 0.1 10*3/uL (ref 0.0–0.1)
Basophils Relative: 0.7 % (ref 0.0–3.0)
Eosinophils Absolute: 0.4 10*3/uL (ref 0.0–0.7)
Eosinophils Relative: 5.7 % — ABNORMAL HIGH (ref 0.0–5.0)
HCT: 44.9 % (ref 39.0–52.0)
Hemoglobin: 15 g/dL (ref 13.0–17.0)
Lymphocytes Relative: 32.5 % (ref 12.0–46.0)
Lymphs Abs: 2.2 10*3/uL (ref 0.7–4.0)
MCHC: 33.4 g/dL (ref 30.0–36.0)
MCV: 92.5 fl (ref 78.0–100.0)
Monocytes Absolute: 0.7 10*3/uL (ref 0.1–1.0)
Monocytes Relative: 9.8 % (ref 3.0–12.0)
Neutro Abs: 3.5 10*3/uL (ref 1.4–7.7)
Neutrophils Relative %: 51.3 % (ref 43.0–77.0)
Platelets: 202 10*3/uL (ref 150.0–400.0)
RBC: 4.85 Mil/uL (ref 4.22–5.81)
RDW: 13.1 % (ref 11.5–15.5)
WBC: 6.8 10*3/uL (ref 4.0–10.5)

## 2022-01-20 LAB — LIPID PANEL
Cholesterol: 94 mg/dL (ref 0–200)
HDL: 35.1 mg/dL — ABNORMAL LOW (ref >39.00)
LDL Cholesterol: 24 mg/dL (ref 0–99)
NonHDL: 59.2
Total CHOL/HDL Ratio: 3
Triglycerides: 174 mg/dL — ABNORMAL HIGH (ref 0.0–149.0)
VLDL: 34.8 mg/dL (ref 0.0–40.0)

## 2022-01-20 LAB — COMPREHENSIVE METABOLIC PANEL
ALT: 16 U/L (ref 0–53)
AST: 19 U/L (ref 0–37)
Albumin: 3.7 g/dL (ref 3.5–5.2)
Alkaline Phosphatase: 37 U/L — ABNORMAL LOW (ref 39–117)
BUN: 16 mg/dL (ref 6–23)
CO2: 32 mEq/L (ref 19–32)
Calcium: 9.1 mg/dL (ref 8.4–10.5)
Chloride: 104 mEq/L (ref 96–112)
Creatinine, Ser: 1.05 mg/dL (ref 0.40–1.50)
GFR: 66.64 mL/min (ref >60.00)
Glucose, Bld: 155 mg/dL — ABNORMAL HIGH (ref 70–99)
Potassium: 4.7 mEq/L (ref 3.5–5.1)
Sodium: 139 mEq/L (ref 135–145)
Total Bilirubin: 0.5 mg/dL (ref 0.2–1.2)
Total Protein: 6.5 g/dL (ref 6.0–8.3)

## 2022-01-20 LAB — CUP PACEART REMOTE DEVICE CHECK
Date Time Interrogation Session: 20230129230556
Implantable Pulse Generator Implant Date: 20210525

## 2022-01-20 LAB — HEMOGLOBIN A1C: Hgb A1c MFr Bld: 7.2 % — ABNORMAL HIGH (ref 4.6–6.5)

## 2022-01-20 LAB — URIC ACID: Uric Acid, Serum: 7.3 mg/dL (ref 4.0–7.8)

## 2022-01-20 NOTE — Progress Notes (Signed)
This visit occurred during the SARS-CoV-2 public health emergency.  Safety protocols were in place, including screening questions prior to the visit, additional usage of staff PPE, and extensive cleaning of exam room while observing appropriate contact time as indicated for disinfecting solutions.  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  Still with some L leg pain.  No CP now, he had occ L sided twinges of pain, most recently a few days ago.  Brief, quick onset, then self resolves.  Not escalating in severity or frequency.  Only noted after and since loop recorder placement.  Not SOB.  H/o splenic infarction noted.  He has seen hematology previously and when I ask for input from hematology about duration of anticoagulation going forward.  He had previous CT chest for lung cancer screening through cardiology and I am going to check with cardiology about scheduling yearly follow-up for that.  Incidentally noted that he has not had any recent gout flares.  History of diabetes noted.  See follow-up labs.  Meds, vitals, and allergies reviewed.   ROS: Per HPI unless specifically indicated in ROS section   GEN: nad, alert and oriented HEENT: ncat NECK: supple w/o LA CV: rrr.  PULM: ctab, no inc wob ABD: soft, +bs EXT: no edema on R leg but L calf puffy- not back to baseline.   SKIN: Well-perfused.  35 minutes were devoted to patient care in this encounter (this includes time spent reviewing the patient's file/history, interviewing and examining the patient, counseling/reviewing plan with patient).

## 2022-01-20 NOTE — Patient Instructions (Addendum)
Go to the lab on the way out.   If you have mychart we'll likely use that to update you.     Keep taking your blood thinner for now. Let me know when you need refills.    Let me check with Dr. Irene Limbo about med duration, follow up, and potentially extra imaging.

## 2022-01-22 ENCOUNTER — Telehealth: Payer: Self-pay | Admitting: Family Medicine

## 2022-01-22 DIAGNOSIS — Z122 Encounter for screening for malignant neoplasm of respiratory organs: Secondary | ICD-10-CM

## 2022-01-22 NOTE — Assessment & Plan Note (Signed)
No recent flares.  See follow-up labs.

## 2022-01-22 NOTE — Telephone Encounter (Signed)
Dr. Charyl Dancer you scheduling his follow-up chest CT for lung cancer screening?  Please let me know.  Thank you for your help.

## 2022-01-22 NOTE — Assessment & Plan Note (Signed)
See follow-up labs.

## 2022-01-22 NOTE — Assessment & Plan Note (Signed)
I am going to check with cardiology about scheduling his follow-up lung cancer screening CT, since the previous scan was ordered through Dr. Johnsie Cancel.    I think it makes sense but the patient to see Dr. Irene Limbo again, given his history.  Continue anticoagulation for now.  I would expect his leg puffiness to gradually improve.  Still okay for outpatient follow-up.  He agrees to plan.  See notes on follow-up labs.

## 2022-01-23 NOTE — Telephone Encounter (Signed)
Pt notified as instructed and pt voiced understanding. Pt said he does not usually answer phone numbers he does not know but he will until gets call about appt.

## 2022-01-23 NOTE — Telephone Encounter (Signed)
Please let patient know that I put in the referral to the lung cancer screening program re: f/u chest CT.  They should arrange the f/u imaging.  Thanks.

## 2022-01-28 NOTE — Progress Notes (Signed)
Carelink Summary Report / Loop Recorder 

## 2022-02-03 DIAGNOSIS — L814 Other melanin hyperpigmentation: Secondary | ICD-10-CM | POA: Diagnosis not present

## 2022-02-03 DIAGNOSIS — Z85828 Personal history of other malignant neoplasm of skin: Secondary | ICD-10-CM | POA: Diagnosis not present

## 2022-02-03 DIAGNOSIS — L821 Other seborrheic keratosis: Secondary | ICD-10-CM | POA: Diagnosis not present

## 2022-02-09 ENCOUNTER — Other Ambulatory Visit: Payer: Self-pay | Admitting: Family Medicine

## 2022-02-24 ENCOUNTER — Ambulatory Visit (INDEPENDENT_AMBULATORY_CARE_PROVIDER_SITE_OTHER): Payer: Medicare Other

## 2022-02-24 DIAGNOSIS — I639 Cerebral infarction, unspecified: Secondary | ICD-10-CM | POA: Diagnosis not present

## 2022-02-24 LAB — CUP PACEART REMOTE DEVICE CHECK
Date Time Interrogation Session: 20230303230855
Implantable Pulse Generator Implant Date: 20210525

## 2022-03-06 NOTE — Progress Notes (Signed)
Carelink Summary Report / Loop Recorder 

## 2022-03-11 ENCOUNTER — Other Ambulatory Visit: Payer: Self-pay | Admitting: Family Medicine

## 2022-03-11 NOTE — Telephone Encounter (Signed)
Husband has no pills left, would like filled today if possible (trazadone) ?

## 2022-03-17 ENCOUNTER — Telehealth: Payer: Self-pay

## 2022-03-17 NOTE — Progress Notes (Signed)
? ? ?Chronic Care Management ?Pharmacy Assistant  ? ?Name: Nathan Fernandez  MRN: [id number] DOB: 1940/12/26 ? ?Reason for Encounter: CCM (General Adherence) ?  ?Recent office visits:  ?01/20/2022 Elsie Stain, MD: HLD: Abnormal Labs "A1c is reasonable at 7.2.  Your hemoglobin is stable and your LDL is great.  Your triglycerides are only minimally elevated.  Your kidney function is normal and your liver tests are fine.  Your uric acid test is reasonable at just above 7.  I would continue your medication as is". Stop: Oxycodone HCI 5 mg ? ?Recent consult visits:  ?01/06/2022 Frederico Hamman [facility] Medicine): DVT: No med changes. F/U in 2 weeks.  ?11/11/2021 Thompson Grayer (Internal Medicine): Cerebral Infarction. No other information.  ? ?Hospital visits:  ?01/04/2022 [facility] ED Discharged Same Day ?Final Diagnoses: ?Lower Leg Pain ?Acute deep vein thrombosis of femoral vein of left lower extremity ?Start: Oxycodone-acetaminophen 5-325 mg ?Rivaroxaban Starter Pack (12 mg and 20 mg) ? ?Medications: ?Outpatient Encounter Medications as of 03/17/2022  ?Medication Sig  ? acetaminophen (TYLENOL) 325 MG tablet Take 1.5 tablets (487.5 mg total) by mouth 2 (two) times daily.  ? albuterol (VENTOLIN HFA) 108 (90 Base) MCG/ACT inhaler Inhale 1-2 puffs into the lungs every 6 (six) hours as needed (COPD).  ? Ascorbic Acid (VITAMIN C) 1000 MG tablet Take 1,000 mg by mouth daily.  ? aspirin 81 MG EC tablet Take 81 mg by mouth daily.  ? buPROPion (WELLBUTRIN SR) 150 MG 12 hr tablet TAKE 1 TABLET BY MOUTH AT BEDTIME.  ? carvedilol (COREG) 6.25 MG tablet Take 1 tablet (6.25 mg total) by mouth 2 (two) times daily.  ? Cholecalciferol (VITAMIN D) 125 MCG (5000 UT) CAPS Take 5,000 Units by mouth daily.  ? cyclobenzaprine (FLEXERIL) 5 MG tablet Take 1 tablet (5 mg total) by mouth 3 (three) times daily as needed.  ? dapagliflozin propanediol (FARXIGA) 5 MG TABS tablet Take 1 tablet (5 mg total) by mouth daily before breakfast.  ?  dorzolamide-timolol (COSOPT) 22.3-6.8 MG/ML ophthalmic solution Place 1 drop into both eyes 2 (two) times daily.  ? fenofibrate (TRICOR) 48 MG tablet TAKE 1 TABLET BY MOUTH EVERY DAY  ? gabapentin (NEURONTIN) 100 MG capsule Take 1 capsule (100 mg total) by mouth 2 (two) times daily.  ? hydrALAZINE (APRESOLINE) 10 MG tablet TAKE 1 TABLET (10 MG TOTAL) BY MOUTH EVERY 12 (TWELVE) HOURS.  ? isosorbide mononitrate (IMDUR) 30 MG 24 hr tablet Take 0.5 tablets (15 mg total) by mouth daily.  ? ketoconazole (NIZORAL) 2 % cream Apply 1 application topically daily as needed for irritation.  ? nitroGLYCERIN (NITROSTAT) 0.4 MG SL tablet Place 1 tablet (0.4 mg total) under the tongue every 5 (five) minutes as needed for chest pain. Up to 3 doses  ? oxyCODONE-acetaminophen (PERCOCET/ROXICET) 5-325 MG tablet Take 1 tablet by mouth every 6 (six) hours as needed for severe pain.  ? Polyethyl Glycol-Propyl Glycol (SYSTANE) 0.4-0.3 % GEL ophthalmic gel Place 1 application into the left eye daily as needed (Dry eyes).  ? rivaroxaban (XARELTO) 20 MG TABS tablet Take 1 tablet (20 mg total) by mouth daily with supper.  ? RIVAROXABAN (XARELTO) VTE STARTER PACK (15 & 20 MG) Follow package directions: Take one '15mg'$  tablet by mouth twice a day. On day 22, switch to one '20mg'$  tablet once a day. Take with food.  ? rosuvastatin (CRESTOR) 20 MG tablet TAKE 1 TABLET BY MOUTH EVERY DAY  ? traZODone (DESYREL) 100 MG tablet TAKE 2 TABLETS  BY MOUTH AT BEDTIME  ? ?No facility-administered encounter medications on file as of 03/17/2022.  ? ?Attempted contact with patient 3 times on 03/27, 03/28 and 03/29. Unsuccessful outreach. Will atttempt contact next month. ? ?Patient is not more than 5 days past due for refill on the following medications per chart history: ? ?Star Medications: ?Medication Name/mg Last Fill Days Supply ?Rosuvastatin 20 mg  02/09/2022 90 ? ?Care Gaps: ?Annual wellness visit in last year? No ?Most Recent BP reading: 122/70 on  01/20/2022 ? ?If Diabetic: ?Most recent A1C reading: 7.2 on 01/20/2022 ?Last eye exam / retinopathy screening: 08/17/2020 ?Last diabetic foot exam: 09/07/2020 ? ?Upcoming appointments: ?PCP appointment on 04/21/2022 at 3:00 ? ?Charlene Brooke, CPP notified ? ?Marijean Niemann, RMA ?Clinical Pharmacy Assistant ?[phone] ? ? ? ? ? ? ?

## 2022-03-21 NOTE — Telephone Encounter (Signed)
Received notice from the lung cancer screening program that patient does not qualify for the program due to current ago of 108. Patients must be between ages 25 and 66 to qualify. They have cancelled the referral.  ?

## 2022-03-23 NOTE — Telephone Encounter (Signed)
Noted. Thanks.  Please update patient.   

## 2022-03-24 NOTE — Telephone Encounter (Signed)
lmtcb

## 2022-03-25 ENCOUNTER — Telehealth: Payer: Self-pay

## 2022-03-25 NOTE — Progress Notes (Signed)
? ? ?Chronic Care Management ?Pharmacy Assistant  ? ?Name: Nathan Fernandez  MRN: [id number] DOB: 12/18/1941 ? ?Reason for Encounter: CCM (General Adherence) ?  ?Recent office visits:  ?01/20/2022 Elsie Stain, MD: HLD: Abnormal Labs "A1c is reasonable at 7.2.  Your hemoglobin is stable and your LDL is great.  Your triglycerides are only minimally elevated.  Your kidney function is normal and your liver tests are fine.  Your uric acid test is reasonable at just above 7.  I would continue your medication as is". Stop: Oxycodone HCI 5 mg ?  ?Recent consult visits:  ?01/06/2022 Frederico Hamman [facility] Medicine): DVT: No med changes. F/U in 2 weeks.  ?11/11/2021 Thompson Grayer (Internal Medicine): Cerebral Infarction. No other information.  ?  ?Hospital visits:  ?01/04/2022 [facility] ED Discharged Same Day ?Final Diagnoses: ?Lower Leg Pain ?Acute deep vein thrombosis of femoral vein of left lower extremity ?Start: Oxycodone-acetaminophen 5-325 mg ?Rivaroxaban Starter Pack (12 mg and 20 mg) ? ?Medications: ?Outpatient Encounter Medications as of 03/25/2022  ?Medication Sig  ? acetaminophen (TYLENOL) 325 MG tablet Take 1.5 tablets (487.5 mg total) by mouth 2 (two) times daily.  ? albuterol (VENTOLIN HFA) 108 (90 Base) MCG/ACT inhaler Inhale 1-2 puffs into the lungs every 6 (six) hours as needed (COPD).  ? Ascorbic Acid (VITAMIN C) 1000 MG tablet Take 1,000 mg by mouth daily.  ? aspirin 81 MG EC tablet Take 81 mg by mouth daily.  ? buPROPion (WELLBUTRIN SR) 150 MG 12 hr tablet TAKE 1 TABLET BY MOUTH AT BEDTIME.  ? carvedilol (COREG) 6.25 MG tablet Take 1 tablet (6.25 mg total) by mouth 2 (two) times daily.  ? Cholecalciferol (VITAMIN D) 125 MCG (5000 UT) CAPS Take 5,000 Units by mouth daily.  ? cyclobenzaprine (FLEXERIL) 5 MG tablet Take 1 tablet (5 mg total) by mouth 3 (three) times daily as needed.  ? dapagliflozin propanediol (FARXIGA) 5 MG TABS tablet Take 1 tablet (5 mg total) by mouth daily before breakfast.  ?  dorzolamide-timolol (COSOPT) 22.3-6.8 MG/ML ophthalmic solution Place 1 drop into both eyes 2 (two) times daily.  ? fenofibrate (TRICOR) 48 MG tablet TAKE 1 TABLET BY MOUTH EVERY DAY  ? gabapentin (NEURONTIN) 100 MG capsule Take 1 capsule (100 mg total) by mouth 2 (two) times daily.  ? hydrALAZINE (APRESOLINE) 10 MG tablet TAKE 1 TABLET (10 MG TOTAL) BY MOUTH EVERY 12 (TWELVE) HOURS.  ? isosorbide mononitrate (IMDUR) 30 MG 24 hr tablet Take 0.5 tablets (15 mg total) by mouth daily.  ? ketoconazole (NIZORAL) 2 % cream Apply 1 application topically daily as needed for irritation.  ? nitroGLYCERIN (NITROSTAT) 0.4 MG SL tablet Place 1 tablet (0.4 mg total) under the tongue every 5 (five) minutes as needed for chest pain. Up to 3 doses  ? oxyCODONE-acetaminophen (PERCOCET/ROXICET) 5-325 MG tablet Take 1 tablet by mouth every 6 (six) hours as needed for severe pain.  ? Polyethyl Glycol-Propyl Glycol (SYSTANE) 0.4-0.3 % GEL ophthalmic gel Place 1 application into the left eye daily as needed (Dry eyes).  ? rivaroxaban (XARELTO) 20 MG TABS tablet Take 1 tablet (20 mg total) by mouth daily with supper.  ? RIVAROXABAN (XARELTO) VTE STARTER PACK (15 & 20 MG) Follow package directions: Take one '15mg'$  tablet by mouth twice a day. On day 22, switch to one '20mg'$  tablet once a day. Take with food.  ? rosuvastatin (CRESTOR) 20 MG tablet TAKE 1 TABLET BY MOUTH EVERY DAY  ? traZODone (DESYREL) 100 MG tablet TAKE  2 TABLETS BY MOUTH AT BEDTIME  ? ?No facility-administered encounter medications on file as of 03/25/2022.  ? ? ?Attempted contact with patient 4 times on 04/05, 04/06, 04/07 and 04/10. Unsuccessful outreach. Will atttempt contact next month. ? ?Patient is not more than 5 days past due for refill on the following medications per chart history: ? ?Star Medications: ?Medication Name/mg Last Fill Days Supply ?Rosuvastatin 20 mg                02/09/2022      90 ? ?Care Gaps: ?Annual wellness visit in last year? No ?Most Recent BP  reading: 122/70 on 01/20/2022 ?  ?If Diabetic: ?Most recent A1C reading: 7.2 on 01/20/2022 ?Last eye exam / retinopathy screening: 08/17/2020 ?Last diabetic foot exam: 09/07/2020 ?  ?Upcoming appointments: ?PCP appointment on 04/21/2022 at 3:00 ?  ?Charlene Brooke, CPP notified ?  ?Marijean Niemann, RMA ?Clinical Pharmacy Assistant ?[phone] ? ? ? ?

## 2022-03-26 NOTE — Telephone Encounter (Signed)
Left message to return call to our office.  

## 2022-03-29 ENCOUNTER — Other Ambulatory Visit: Payer: Self-pay | Admitting: Family Medicine

## 2022-03-29 DIAGNOSIS — I82412 Acute embolism and thrombosis of left femoral vein: Secondary | ICD-10-CM

## 2022-03-31 ENCOUNTER — Ambulatory Visit (INDEPENDENT_AMBULATORY_CARE_PROVIDER_SITE_OTHER): Payer: Medicare Other

## 2022-03-31 DIAGNOSIS — I639 Cerebral infarction, unspecified: Secondary | ICD-10-CM | POA: Diagnosis not present

## 2022-03-31 NOTE — Telephone Encounter (Signed)
lmtcb

## 2022-04-01 LAB — CUP PACEART REMOTE DEVICE CHECK
Date Time Interrogation Session: 20230411094022
Implantable Pulse Generator Implant Date: 20210525

## 2022-04-04 IMAGING — DX DG CHEST 2V
2 series · 2 of 2 positions shown · non-contrast
Comparison: July 07, 2019.

CLINICAL DATA: Chronic obstructive pulmonary disease.

EXAM:
CHEST - 2 VIEW

[chest pa]
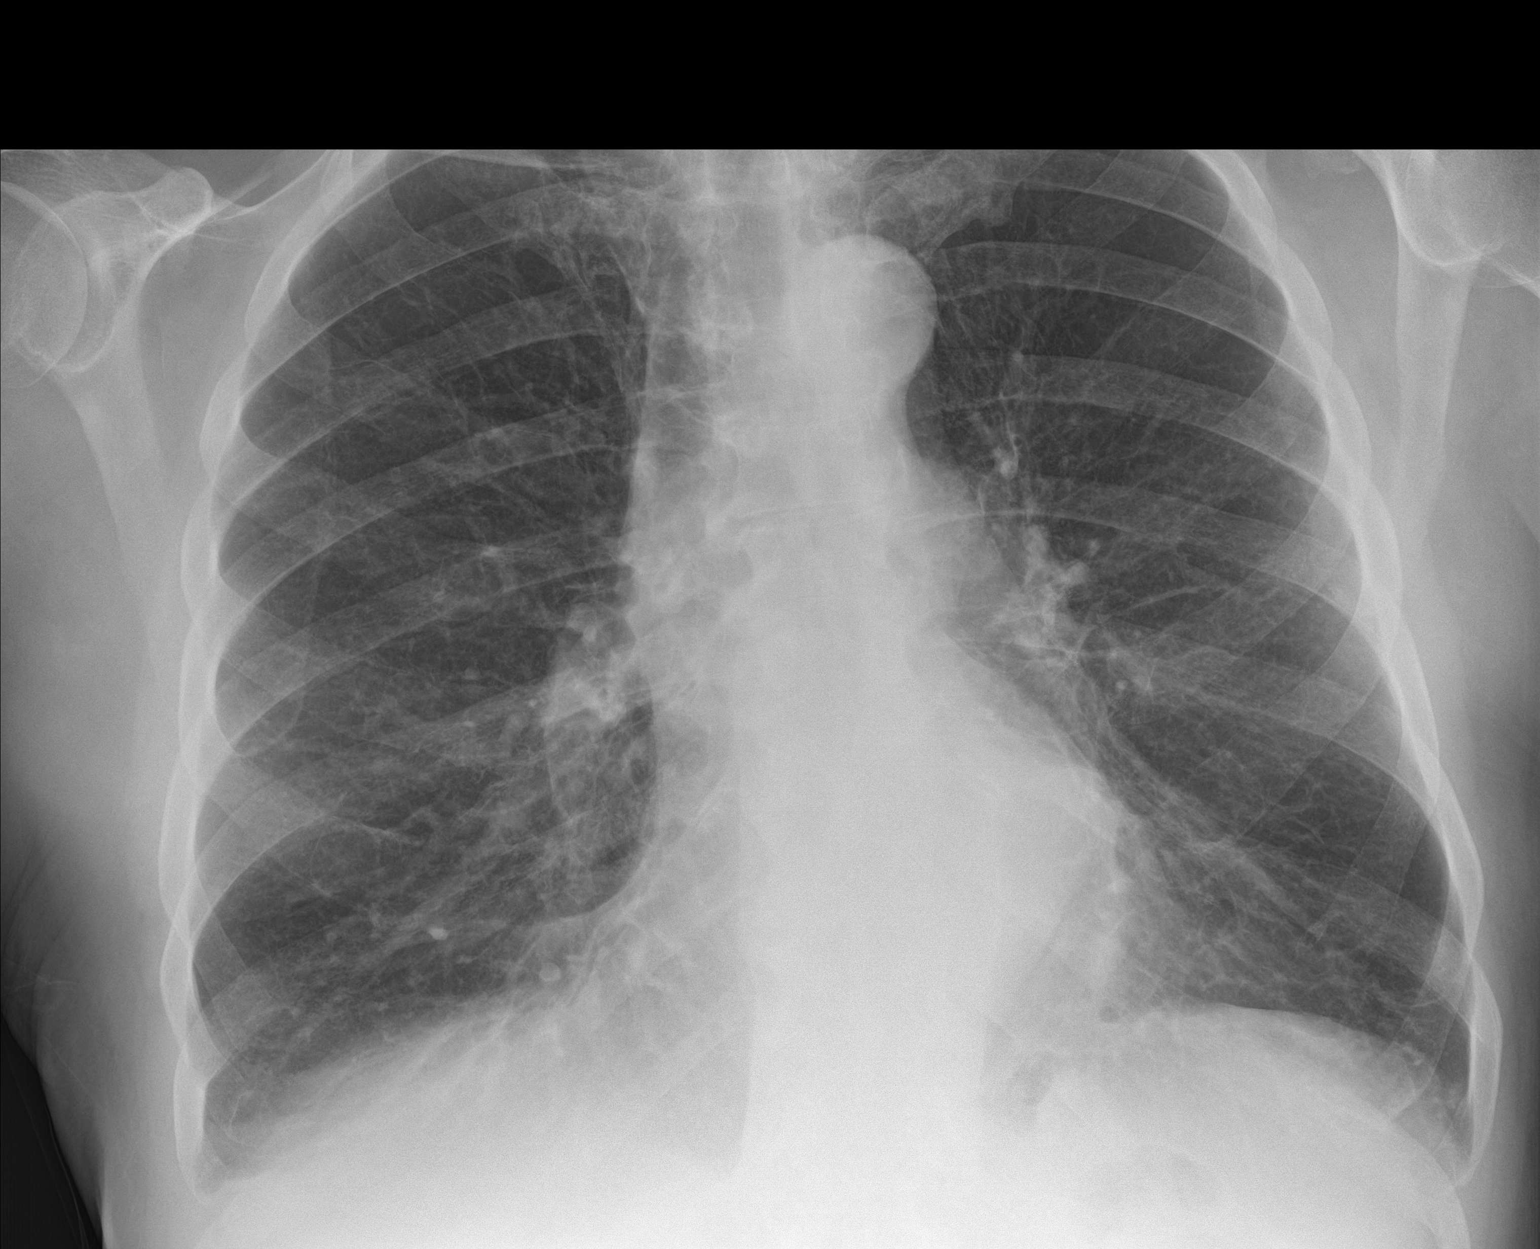

[chest lat]
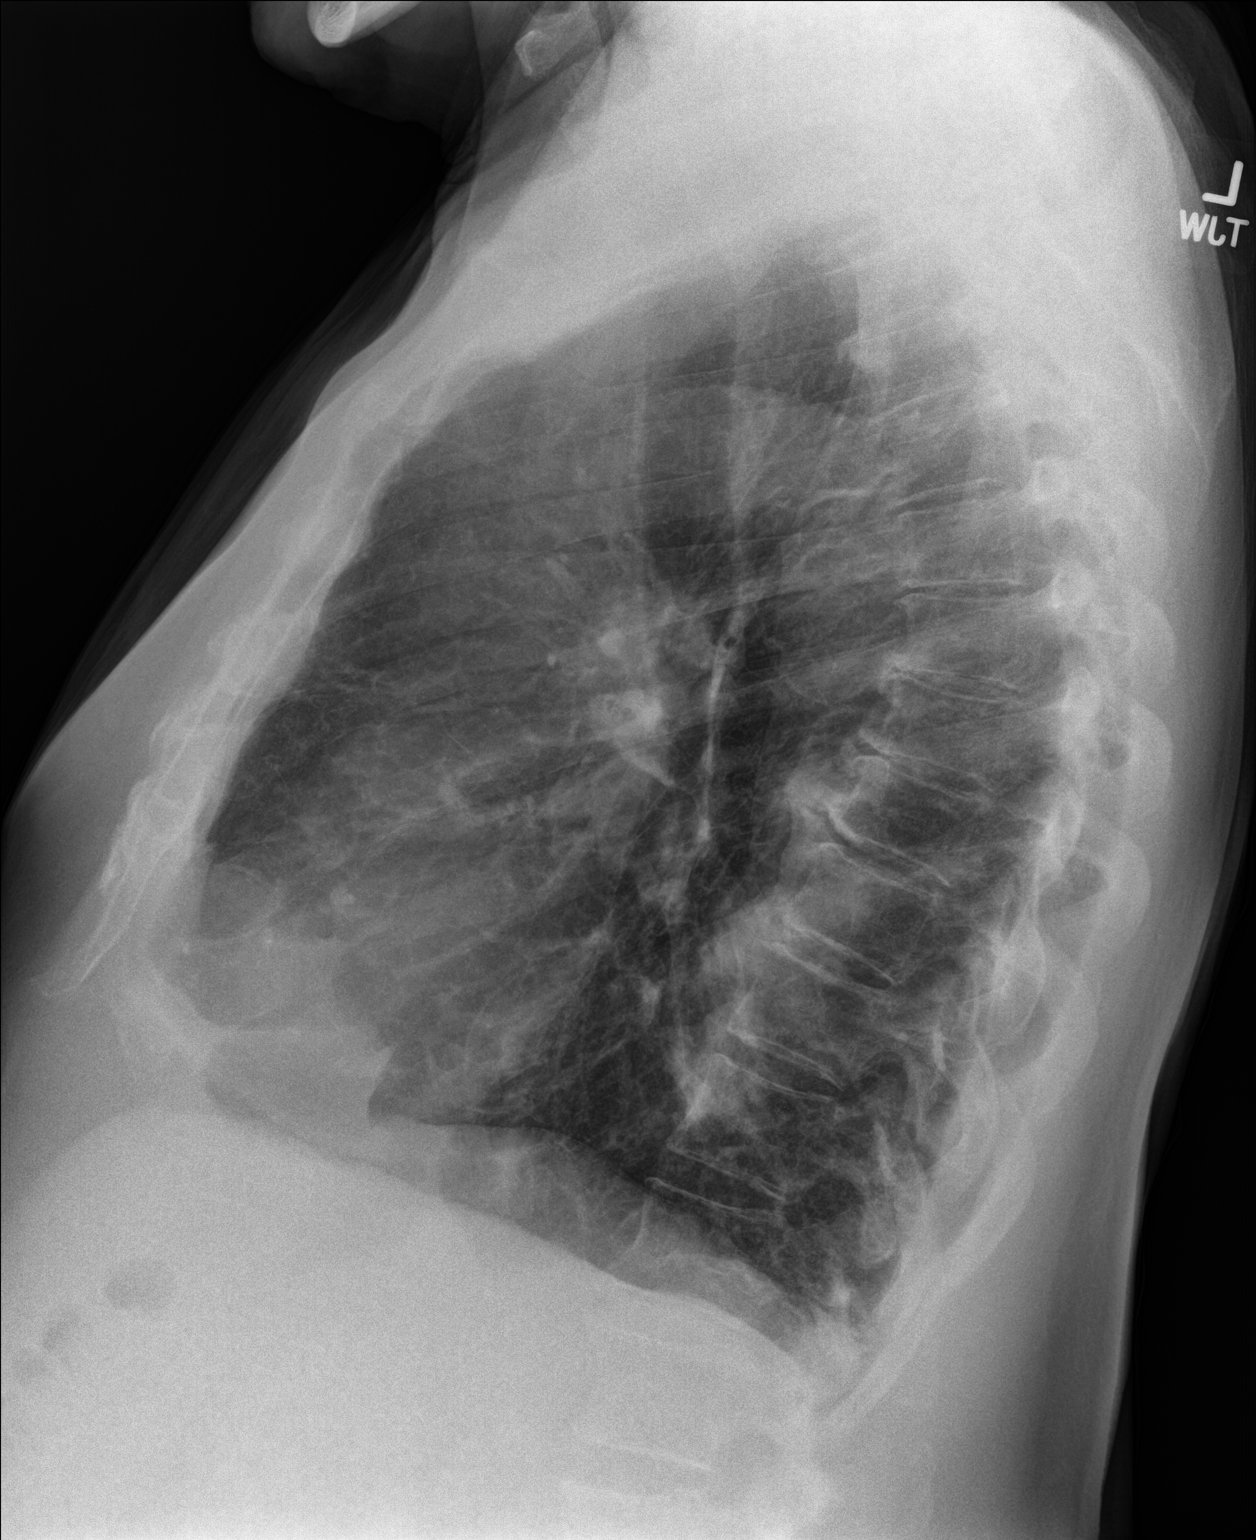

[2 of 2 positions shown; findings below may reference images not displayed]

FINDINGS: The heart size and mediastinal contours are within normal limits.
Both lungs are clear. No pneumothorax or pleural effusion is noted.
The visualized skeletal structures are unremarkable.
IMPRESSION: No active cardiopulmonary disease.

## 2022-04-04 NOTE — Telephone Encounter (Signed)
Called both numbers at home again and sent letter with the information ?

## 2022-04-15 NOTE — Progress Notes (Signed)
Carelink Summary Report / Loop Recorder 

## 2022-04-16 ENCOUNTER — Telehealth: Payer: Self-pay

## 2022-04-16 NOTE — Progress Notes (Signed)
? ? ?  Chronic Care Management ?Pharmacy Assistant  ? ?Name: Nathan Fernandez  MRN: [id number] DOB: 1941-04-24 ? ?Reason for Encounter: CCM Counsellor) ?  ?Medications: ?Outpatient Encounter Medications as of 04/16/2022  ?Medication Sig  ? acetaminophen (TYLENOL) 325 MG tablet Take 1.5 tablets (487.5 mg total) by mouth 2 (two) times daily.  ? albuterol (VENTOLIN HFA) 108 (90 Base) MCG/ACT inhaler Inhale 1-2 puffs into the lungs every 6 (six) hours as needed (COPD).  ? Ascorbic Acid (VITAMIN C) 1000 MG tablet Take 1,000 mg by mouth daily.  ? aspirin 81 MG EC tablet Take 81 mg by mouth daily.  ? buPROPion (WELLBUTRIN SR) 150 MG 12 hr tablet TAKE 1 TABLET BY MOUTH AT BEDTIME.  ? carvedilol (COREG) 6.25 MG tablet Take 1 tablet (6.25 mg total) by mouth 2 (two) times daily.  ? Cholecalciferol (VITAMIN D) 125 MCG (5000 UT) CAPS Take 5,000 Units by mouth daily.  ? cyclobenzaprine (FLEXERIL) 5 MG tablet Take 1 tablet (5 mg total) by mouth 3 (three) times daily as needed.  ? dapagliflozin propanediol (FARXIGA) 5 MG TABS tablet Take 1 tablet (5 mg total) by mouth daily before breakfast.  ? dorzolamide-timolol (COSOPT) 22.3-6.8 MG/ML ophthalmic solution Place 1 drop into both eyes 2 (two) times daily.  ? fenofibrate (TRICOR) 48 MG tablet TAKE 1 TABLET BY MOUTH EVERY DAY  ? gabapentin (NEURONTIN) 100 MG capsule Take 1 capsule (100 mg total) by mouth 2 (two) times daily.  ? hydrALAZINE (APRESOLINE) 10 MG tablet TAKE 1 TABLET (10 MG TOTAL) BY MOUTH EVERY 12 (TWELVE) HOURS.  ? isosorbide mononitrate (IMDUR) 30 MG 24 hr tablet Take 0.5 tablets (15 mg total) by mouth daily.  ? ketoconazole (NIZORAL) 2 % cream Apply 1 application topically daily as needed for irritation.  ? nitroGLYCERIN (NITROSTAT) 0.4 MG SL tablet Place 1 tablet (0.4 mg total) under the tongue every 5 (five) minutes as needed for chest pain. Up to 3 doses  ? oxyCODONE-acetaminophen (PERCOCET/ROXICET) 5-325 MG tablet Take 1 tablet by mouth every 6 (six) hours as  needed for severe pain.  ? Polyethyl Glycol-Propyl Glycol (SYSTANE) 0.4-0.3 % GEL ophthalmic gel Place 1 application into the left eye daily as needed (Dry eyes).  ? RIVAROXABAN (XARELTO) VTE STARTER PACK (15 & 20 MG) Follow package directions: Take one '15mg'$  tablet by mouth twice a day. On day 22, switch to one '20mg'$  tablet once a day. Take with food.  ? rosuvastatin (CRESTOR) 20 MG tablet TAKE 1 TABLET BY MOUTH EVERY DAY  ? traZODone (DESYREL) 100 MG tablet TAKE 2 TABLETS BY MOUTH AT BEDTIME  ? XARELTO 20 MG TABS tablet TAKE 1 TABLET BY MOUTH DAILY WITH SUPPER.  ? ?No facility-administered encounter medications on file as of 04/16/2022.  ? ?Marthann Schiller was contacted to remind of upcoming telephone visit with Charlene Brooke on 04/21/2022 at 3:00. Patient was reminded to have any blood glucose and blood pressure readings available for review at appointment.  ? ?Message was left reminding patient of appointment. ? ?Star Rating Drugs: ?Medication:  Last Fill: Day Supply ?Rosuvastatin 20 mg 02/09/2022      90 ? ?Charlene Brooke, CPP notified ? ?Marijean Niemann, RMA ?Clinical Pharmacy Assistant ?[phone] ? ? ? ? ?

## 2022-04-21 ENCOUNTER — Ambulatory Visit (INDEPENDENT_AMBULATORY_CARE_PROVIDER_SITE_OTHER): Payer: Medicare Other | Admitting: Pharmacist

## 2022-04-21 DIAGNOSIS — E119 Type 2 diabetes mellitus without complications: Secondary | ICD-10-CM

## 2022-04-21 DIAGNOSIS — E7800 Pure hypercholesterolemia, unspecified: Secondary | ICD-10-CM

## 2022-04-21 DIAGNOSIS — Z86718 Personal history of other venous thrombosis and embolism: Secondary | ICD-10-CM

## 2022-04-21 DIAGNOSIS — I10 Essential (primary) hypertension: Secondary | ICD-10-CM

## 2022-04-21 DIAGNOSIS — I2510 Atherosclerotic heart disease of native coronary artery without angina pectoris: Secondary | ICD-10-CM

## 2022-04-21 NOTE — Progress Notes (Signed)
Chronic Care Management Pharmacy Note  04/23/2022 Name:  Nathan Fernandez MRN:  [id number] DOB:  [birth year]  Summary: CCM F/U visit -Pt c/o mainly of groin/leg pain; taking Tylenol, Flexeril and occasional Percocet; pt reports he felt better while doing PT but does not do any exercises at home -Based on fill dates pt has not been taking [place]; A1c 7.2% most recently (while pt was not taking [place]) - it is reasonable to continue without medication -DM Foot/Eye exam due - advised pt to schedule eye exam  Recommendations/Changes made from today's visit: -Recommend PCP evaluation - consider PT again -Removed Farxiga from med list  Plan: -CCM Health Concierge will call patient 3 months for general update -Pharmacist follow up televisit scheduled for 6 months -PCP scheduled 05/01/22 - groin/leg pain   Subjective: Nathan Fernandez is an 81 y.o. year old male who is a primary patient of Para March, Dwana Curd, MD.  The CCM team was consulted for assistance with disease management and care coordination needs.    Engaged with patient by telephone for follow up visit in response to provider referral for pharmacy case management and/or care coordination services.   Consent to Services:  The patient was given information about Chronic Care Management services, agreed to services, and gave verbal consent prior to initiation of services.  Please see initial visit note for detailed documentation.   Patient Care Team: Joaquim Nam, MD as PCP - General (Family Medicine) Wendall Stade, MD as PCP - Cardiology (Cardiology) Wendall Stade, MD as Consulting Physician (Cardiology) Kathyrn Sheriff, [facility] as Pharmacist (Pharmacist)  Recent office visits: 01/20/2022 Crawford Givens, MD: HLD: Abnormal Labs "A1c is reasonable at 7.2.  Your hemoglobin is stable and your LDL is great.  Your triglycerides are only minimally elevated.  Your kidney function is normal and your liver tests are fine.  Your uric acid  test is reasonable at just above 7.  I would continue your medication as is". Stop: Oxycodone HCI 5 mg  Recent consult visits: 01/06/2022 Karleen Hampshire [facility]): DVT: No med changes. F/U in 2 weeks.  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 No other information.   Hospital visits: 01/04/22 ED visit- acute DVT. Restart Xarelto   Objective:  Lab Results  Component Value Date   CREATININE 1.05 01/20/2022   BUN 16 01/20/2022   GFR 66.64 01/20/2022   GFRNONAA >60 06/17/2021   GFRAA 79 01/09/2021   NA 139 01/20/2022   K 4.7 01/20/2022   CALCIUM 9.1 01/20/2022   CO2 32 01/20/2022   GLUCOSE 155 (H) 01/20/2022    Lab Results  Component Value Date/Time   HGBA1C 7.2 (H) 01/20/2022 11:29 AM   HGBA1C 7.7 (H) 06/17/2021 10:41 AM   GFR 66.64 01/20/2022 11:29 AM   GFR 82.40 02/21/2021 04:42 PM   MICROALBUR 4.7 (H) 11/25/2010 09:12 AM   MICROALBUR 2.9 (H) 01/31/2008 10:39 AM    Last diabetic Eye exam: No results found for: HMDIABEYEEXA  Last diabetic Foot exam:  Lab Results  Component Value Date/Time   HMDIABFOOTEX Done 11/28/2010 12:00 AM     Lab Results  Component Value Date   CHOL 94 01/20/2022   HDL 35.10 (L) 01/20/2022   LDLCALC 24 01/20/2022   LDLDIRECT 64.0 08/31/2020   TRIG 174.0 (H) 01/20/2022   CHOLHDL 3 01/20/2022       Latest Ref Rng & Units 01/20/2022   11:29 AM 02/14/2021    2:53 AM 02/13/2021    1:49  AM  Hepatic Function  Total Protein 6.0 - 8.3 g/dL 6.5   5.7   5.5    Albumin 3.5 - 5.2 g/dL 3.7   2.9   2.8    AST 0 - 37 U/L 19   21   25     ALT 0 - 53 U/L 16   17   18     Alk Phosphatase 39 - 117 U/L 37   47   41    Total Bilirubin 0.2 - 1.2 mg/dL 0.5   0.4   0.3      Lab Results  Component Value Date/Time   TSH 3.054 02/12/2021 06:22 AM   TSH 2.39 08/31/2018 11:56 AM   TSH 1.90 02/24/2013 08:26 AM       Latest Ref Rng & Units 01/20/2022   11:29 AM 06/17/2021   10:41 AM 02/21/2021    4:37 PM  CBC  WBC 4.0 - 10.5  K/uL 6.8   7.2   7.2    Hemoglobin 13.0 - 17.0 g/dL 13.0   86.5   78.4    Hematocrit 39.0 - 52.0 % 44.9   47.5   41.3    Platelets 150.0 - 400.0 K/uL 202.0   168   207.0      Lab Results  Component Value Date/Time   VD25OH 49 11/25/2010 09:38 PM    Clinical ASCVD: Yes  The ASCVD Risk score (Arnett DK, et al., 2019) failed to calculate for the following reasons:   The 2019 ASCVD risk score is only valid for ages 23 to 31   The patient has a prior MI or stroke diagnosis       08/31/2020   11:28 AM 08/30/2019    9:56 AM 08/24/2018   10:01 AM  Depression screen PHQ 2/9  Decreased Interest 0 0 0  Down, Depressed, Hopeless 0 0 0  PHQ - 2 Score 0 0 0  Altered sleeping 0 0 0  Tired, decreased energy 0 0 0  Change in appetite 0 0 0  Feeling bad or failure about yourself  0 0 0  Trouble concentrating 0 0 0  Moving slowly or fidgety/restless 0 0 0  Suicidal thoughts 0 0 0  PHQ-9 Score 0 0 0  Difficult doing work/chores Not difficult at all  Not difficult at all     Social History   Tobacco Use  Smoking Status Former   Types: Cigars  Smokeless Tobacco Never  Tobacco Comments   cigar occassionally   BP Readings from Last 3 Encounters:  01/20/22 122/70  01/06/22 112/62  01/04/22 (!) 169/103   Pulse Readings from Last 3 Encounters:  01/20/22 68  01/06/22 72  01/04/22 75   Wt Readings from Last 3 Encounters:  01/20/22 174 lb (78.9 kg)  01/06/22 175 lb 2 oz (79.4 kg)  01/04/22 165 lb (74.8 kg)   BMI Readings from Last 3 Encounters:  01/20/22 25.70 kg/m  01/06/22 25.86 kg/m  01/04/22 24.37 kg/m    Assessment/Interventions: Review of patient past medical history, allergies, medications, health status, including review of consultants reports, laboratory and other test data, was performed as part of comprehensive evaluation and provision of chronic care management services.   SDOH:  (Social Determinants of Health) assessments and interventions performed: Yes SDOH  Interventions    Flowsheet Row Most Recent Value  SDOH Interventions   Food Insecurity Interventions Intervention Not Indicated  Financial Strain Interventions Intervention Not Indicated      SDOH Screenings  Alcohol Screen: Not on file  Depression (PHQ2-9): Not on file  Financial Resource Strain: Low Risk    Difficulty of Paying Living Expenses: Not very hard  Food Insecurity: No Food Insecurity   Worried About Running Out of Food in the Last Year: Never true   Ran Out of Food in the Last Year: Never true  Housing: Not on file  Physical Activity: Not on file  Social Connections: Not on file  Stress: Not on file  Tobacco Use: Medium Risk   Smoking Tobacco Use: Former   Smokeless Tobacco Use: Never   Passive Exposure: Not on file  Transportation Needs: Not on file    CCM Care Plan  Allergies  Allergen Reactions   Ace Inhibitors Swelling    Swelling of the tongue   Angiotensin Receptor Blockers Swelling    Tongue swelling   Enalapril Swelling    Tongue swelling    Medications Reviewed Today     Reviewed by Kathyrn Sheriff, [facility] (Pharmacist) on 04/23/22 at 0905  Med List Status: <None>   Medication Order Taking? Sig Documenting Provider Last Dose Status Informant  acetaminophen (TYLENOL) 325 MG tablet [id number] Yes Take 1.5 tablets (487.5 mg total) by mouth 2 (two) times daily. Donalee Citrin, MD Taking Active   albuterol (VENTOLIN HFA) 108 (90 Base) MCG/ACT inhaler [id number] Yes Inhale 1-2 puffs into the lungs every 6 (six) hours as needed (COPD). Lisbeth Renshaw, MD Taking Active   Ascorbic Acid (VITAMIN C) 1000 MG tablet [id number] Yes Take 1,000 mg by mouth daily. [provider] Taking Active Spouse/Significant Other  aspirin 81 MG EC tablet [id number] Yes Take 81 mg by mouth daily. [provider] Taking Active Spouse/Significant Other           Med Note [place] Nickel Jan 06, 2022  2:11 PM)    buPROPion [facility] SR) 150 MG 12 hr  tablet [id number] Yes TAKE 1 TABLET BY MOUTH AT BEDTIME. Joaquim Nam, MD Taking Active   carvedilol (COREG) 6.25 MG tablet [id number] Yes Take 1 tablet (6.25 mg total) by mouth 2 (two) times daily. Wendall Stade, MD Taking Active   Cholecalciferol (VITAMIN D) 125 MCG (5000 UT) CAPS [id number] Yes Take 5,000 Units by mouth daily. [provider] Taking Active Spouse/Significant Other  cyclobenzaprine (FLEXERIL) 5 MG tablet [id number] Yes Take 1 tablet (5 mg total) by mouth 3 (three) times daily as needed. Joaquim Nam, MD Taking Active   Patient not taking:  Discontinued 04/23/22 0905 (Patient Preference) dorzolamide-timolol (COSOPT) 22.3-6.8 MG/ML ophthalmic solution [id number] Yes Place 1 drop into both eyes 2 (two) times daily. [provider] Taking Active Spouse/Significant Other  fenofibrate (TRICOR) 48 MG tablet [id number] Yes TAKE 1 TABLET BY MOUTH EVERY DAY Wendall Stade, MD Taking Active   gabapentin (NEURONTIN) 100 MG capsule [id number] Yes Take 1 capsule (100 mg total) by mouth 2 (two) times daily. Donalee Citrin, MD Taking Active   hydrALAZINE (APRESOLINE) 10 MG tablet [id number]  TAKE 1 TABLET (10 MG TOTAL) BY MOUTH EVERY 12 (TWELVE) HOURS. Tereso Newcomer T, PA-C  Expired 03/27/22 2359 Spouse/Significant Other  isosorbide mononitrate (IMDUR) 30 MG 24 hr tablet [id number] Yes Take 0.5 tablets (15 mg total) by mouth daily. Donalee Citrin, MD Taking Active   ketoconazole (NIZORAL) 2 % cream [id number] Yes Apply 1 application topically daily as needed for irritation. Joaquim Nam, MD Taking Active Spouse/Significant Other  nitroGLYCERIN (NITROSTAT) 0.4 MG SL tablet [id number] Yes Place 1  tablet (0.4 mg total) under the tongue every 5 (five) minutes as needed for chest pain. Up to 3 doses Wendall Stade, MD Taking Active Spouse/Significant Other  oxyCODONE-acetaminophen (PERCOCET/ROXICET) 5-325 MG tablet [id number] Yes Take 1 tablet by mouth every 6 (six) hours as needed for severe  pain. Terrilee Files, MD Taking Active   Polyethyl Glycol-Propyl Glycol (SYSTANE) 0.4-0.3 % GEL ophthalmic gel [id number] Yes Place 1 application into the left eye daily as needed (Dry eyes). [provider] Taking Active Spouse/Significant Other  RIVAROXABAN (XARELTO) VTE STARTER PACK (15 & 20 MG) [id number] Yes Follow package directions: Take one 15mg  tablet by mouth twice a day. On day 22, switch to one 20mg  tablet once a day. Take with food. Terrilee Files, MD Taking Active   rosuvastatin (CRESTOR) 20 MG tablet [id number] Yes TAKE 1 TABLET BY MOUTH EVERY DAY Joaquim Nam, MD Taking Active   traZODone (DESYREL) 100 MG tablet [id number] Yes TAKE 2 TABLETS BY MOUTH AT BEDTIME Joaquim Nam, MD Taking Active   XARELTO 20 MG TABS tablet [id number] Yes TAKE 1 TABLET BY MOUTH DAILY WITH SUPPER. 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   Hemoptysis 07/10/2019   Abnormal cardiovascular stress test    Tremor 09/02/2018   Advance care planning 09/02/2018   Cold intolerance 09/02/2018   Dry mouth 03/12/2018   Shoulder pain 01/05/2018   Hypoxia 12/29/2017   Wrist pain 12/29/2017   Lower back pain 02/18/2017   Lower urinary tract symptoms (LUTS) 02/11/2016   Bilateral knee pain 05/25/2015   Benign paroxysmal positional vertigo 02/18/2015   Abdominal wall symptom 02/18/2015   Gout 05/04/2014   Healthcare maintenance 04/18/2014   COPD (chronic obstructive pulmonary disease) (HCC) 03/25/2013   CAD (coronary artery disease) 03/03/2013   Other malaise  and fatigue 02/22/2013   Chest wall pain 05/31/2012   Heartburn 05/07/2011   Depression 11/28/2010   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   NICOTINE ADDICTION 06/04/2010   Diabetes mellitus (HCC) 03/18/2010   SCIATICA, 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   HYPERCHOLESTEROLEMIA 10/19/2007   ERECTILE DYSFUNCTION 10/19/2007   HYPERTENSION, BENIGN 10/19/2007   ROSACEA 10/19/2007   Osteoarthritis 10/19/2007   Insomnia 10/19/2007    Immunization History  Administered Date(s) Administered   Influenza Split 01/07/2012   Influenza,inj,Quad PF,6+ Mos 02/08/2016   PFIZER(Purple Top)SARS-COV-2 Vaccination 02/13/2020, 03/05/2020   Pneumococcal Conjugate-13 02/08/2016   Pneumococcal Polysaccharide-23 09/07/2020   Td 09/01/2001    Conditions to be addressed/monitored:  Hypertension, Hyperlipidemia, Diabetes, and Coronary Artery Disease, Recurrent DVT  Care Plan : CCM Pharmacy Care Plan  Updates made by Kathyrn Sheriff, RPH since 04/23/2022 12:00 AM     Problem: Hypertension, Hyperlipidemia, Diabetes, and Coronary Artery Disease, Recurrent DVT   Priority: High     Long-Range Goal: Disease mgmt   Start Date: 04/15/2021  Expected End Date: 04/24/2023  This Visit's Progress: On track  Priority: High  Note:   Current Barriers:  Pain uncontrolled  Pharmacist Clinical Goal(s):  Patient will contact provider office  for questions/concerns as evidenced notation of same in electronic health record through collaboration with PharmD and provider.   Interventions: 1:1 collaboration with Joaquim Nam, MD regarding development and update of comprehensive plan of care as evidenced by provider attestation and co-signature Inter-disciplinary care team collaboration (see longitudinal plan of care) Comprehensive medication review performed; medication list updated in electronic medical record  Hypertension/CAD (BP goal <140/90) -Controlled - per clinic readings -Current home readings:  none available -Current treatment: Carvedilol 6.25 mg 1/2 tab BID - Appropriate, Effective, Safe, Accessible Hydralazine 10 mg BID - Appropriate, Effective, Safe, Accessible Isosorbide mononitrate 30 mg 1/2 tab daily - Appropriate, Effective, Safe, Accessible -Medications previously tried: ACE/ARB allergy  -Educated on BP goals and benefits of medications for prevention of heart attack, stroke and kidney damage; Limit salty six  -Recommend monitor BP at home periodically -Recommended to continue current medication  Hyperlipidemia/CAD: (LDL goal < 100) -Controlled - LDL 24 (12/2021), TRIG 174 at goal;  -Current treatment: Rosuvastatin 20 mg daily - Appropriate, Effective, Safe, Accessible Fenofibrate 48 mg daily -Appropriate, Effective, Safe, Accessible Aspirin 81 mg daily -Appropriate, Effective, Safe, Accessible -Medications previously tried: pt confirmed off pravastatin and gemfibrozil   -Recommended to continue current medication  Diabetes (A1c goal <8%) -Controlled - A1c 7.2% (12/2021), pt was prescribed Farxiga 02/2021 but his Rx/refills would have run out 08/2021 and pt has not been taking this -Denies hypoglycemic/hyperglycemic symptoms -Current medications: Farxiga 5 mg daily  - not taking -Medications previously tried: metformin (2010-2013), Farxiga -Educated on A1c and blood sugar goals; -Counseled to check feet daily and get yearly eye exams -It is reasonable to continue without medication given A1c at goal; removed Farxiga from med list  COPD (Goal: control symptoms and prevent exacerbations) -Controlled - per patient report -Pulmonary function testing: none per chart -Exacerbations requiring treatment in last 6 months: none -Current treatment  Albuterol HFA prn- Appropriate, Effective, Safe, Accessible -Medications previously tried: none -Frequency of rescue inhaler use: once/week - increased lately due to allergies -Recommended to continue current medication  Pain  (Goal: Control symptoms) -Uncontrolled - pt reports persistent leg/groin and back pain, limiting his movement; he previously has done PT and reports mobility was better while he was doing that, he has not continued home exercises; he is interested in doing PT again -Non-pharm: Uses heat -Current treatment  Tylenol Arthritis 650 mg- 2 tab AM - Appropriate, Query Effective Flexeril 5 mg TID PRN - Appropriate, Query Effective -Medications previously tried: none, did not try lidocaine patch -Recommended PCP evaluation - consider PT again  Recurrent DVT (goal: prevent recurrence) -Controlled - pt endorses compliance with Xarelto, denies s/sx of bleeding -Hx splenic infarct 2021; Acute DVT 12/2021. F/u Dr Candise Che regarding recurrent VTE. Indefinite anticoagulation recommended. -Current treatment  Xarelto 20 mg daily - Appropriate, Effective, Safe, Accessible -Reviewed indication for Xarelto and importance of daily compliance -Recommended to continue current medication  Patient Goals/Self-Care Activities Patient will:  - take medications as prescribed as evidenced by patient report and record review focus on medication adherence by routine      Medication Assistance: None required.  Patient affirms current coverage meets needs.  Compliance/Adherence/Medication fill history: Care Gaps: Foot exam (due 09/07/21) Eye exam (due 08/17/21) Urine MA (due 11/26/11)  Star-Rating Drugs: Farxiga - PDC 0%; LF 06/06/21 x 30 ds  Patient's preferred pharmacy is:  [facility] - [place], [place] - [address] [place]  [address] Phone: [phone] Fax: [phone]  [facility] [phone] -  [place], [place] - [address] [place] [address] Phone: [phone] Fax: [phone]  Uses pill box? Yes Pt endorses 100% compliance  We discussed: Current pharmacy is preferred with insurance plan and patient is satisfied with pharmacy services Patient  decided to: Continue current medication management strategy  Care Plan and Follow Up Patient Decision:  Patient agrees to Care Plan and Follow-up.  Plan: Telephone follow up appointment with care management team member scheduled for:  6 months  Al Corpus, PharmD, BCACP Clinical Pharmacist [facility] at [facility] [phone]

## 2022-04-23 NOTE — Patient Instructions (Signed)
Visit Information ? ?Phone number for Pharmacist: [phone] ? ? Goals Addressed   ?None ?  ? ? ?Care Plan : [place]  ?Updates made by Charlton Haws, RPH since 04/23/2022 12:00 AM  ?  ? ?Problem: Hypertension, Hyperlipidemia, Diabetes, and Coronary Artery Disease, Recurrent DVT   ?Priority: High  ?  ? ?Long-Range Goal: Disease mgmt   ?Start Date: 04/15/2021  ?Expected End Date: 04/24/2023  ?This Visit's Progress: On track  ?Priority: High  ?Note:   ?Current Barriers:  ?Pain uncontrolled ? ?Pharmacist Clinical Goal(s):  ?Patient will contact provider office for questions/concerns as evidenced notation of same in electronic health record through collaboration with PharmD and provider.  ? ?Interventions: ?1:1 collaboration with Tonia Ghent, MD regarding development and update of comprehensive plan of care as evidenced by provider attestation and co-signature ?Inter-disciplinary care team collaboration (see longitudinal plan of care) ?Comprehensive medication review performed; medication list updated in electronic medical record ? ?Hypertension/CAD (BP goal <140/90) ?-Controlled - per clinic readings ?-Current home readings: none available ?-Current treatment: ?Carvedilol 6.25 mg 1/2 tab BID - Appropriate, Effective, Safe, Accessible ?Hydralazine 10 mg BID - Appropriate, Effective, Safe, Accessible ?Isosorbide mononitrate 30 mg 1/2 tab daily - Appropriate, Effective, Safe, Accessible ?-Medications previously tried: ACE/ARB allergy  ?-Educated on BP goals and benefits of medications for prevention of heart attack, stroke and kidney damage; Limit salty six  ?-Recommend monitor BP at home periodically ?-Recommended to continue current medication ? ?Hyperlipidemia/CAD: (LDL goal < 100) ?-Controlled - LDL 24 (12/2021), TRIG 174 at goal;  ?-Current treatment: ?Rosuvastatin 20 mg daily - Appropriate, Effective, Safe, Accessible ?Fenofibrate 48 mg daily -Appropriate, Effective, Safe, Accessible ?Aspirin  81 mg daily -Appropriate, Effective, Safe, Accessible ?-Medications previously tried: pt confirmed off pravastatin and gemfibrozil   ?-Recommended to continue current medication ? ?Diabetes (A1c goal <8%) ?-Controlled - A1c 7.2% (12/2021), pt was prescribed Farxiga 02/2021 but his Rx/refills would have run out 08/2021 and pt has not been taking this ?-Denies hypoglycemic/hyperglycemic symptoms ?-Current medications: ?Farxiga 5 mg daily  - not taking ?-Medications previously tried: metformin (2010-2013), Wilder Glade ?-Educated on A1c and blood sugar goals; ?-Counseled to check feet daily and get yearly eye exams ?-It is reasonable to continue without medication given A1c at goal; removed Farxiga from med list ? ?COPD (Goal: control symptoms and prevent exacerbations) ?-Controlled - per patient report ?-Pulmonary function testing: none per chart ?-Exacerbations requiring treatment in last 6 months: none ?-Current treatment  ?Albuterol HFA prn- Appropriate, Effective, Safe, Accessible ?-Medications previously tried: none ?-Frequency of rescue inhaler use: once/week - increased lately due to allergies ?-Recommended to continue current medication ? ?Pain (Goal: Control symptoms) ?-Uncontrolled - pt reports persistent leg/groin and back pain, limiting his movement; he previously has done PT and reports mobility was better while he was doing that, he has not continued home exercises; he is interested in doing PT again ?-Non-pharm: Uses heat ?-Current treatment  ?Tylenol Arthritis 650 mg- 2 tab AM - Appropriate, Query Effective ?Flexeril 5 mg TID PRN - Appropriate, Query Effective ?-Medications previously tried: none, did not try lidocaine patch ?-Recommended PCP evaluation - consider PT again ? ?Recurrent DVT (goal: prevent recurrence) ?-Controlled - pt endorses compliance with Xarelto, denies s/sx of bleeding ?-Hx splenic infarct 2021; Acute DVT 12/2021. F/u Dr Irene Limbo regarding recurrent VTE. Indefinite anticoagulation  recommended. ?-Current treatment  ?Xarelto 20 mg daily - Appropriate, Effective, Safe, Accessible ?-Reviewed indication for Xarelto and importance of daily compliance ?-Recommended to continue current medication ? ?  Patient Goals/Self-Care Activities ?Patient will:  ?- take medications as prescribed as evidenced by patient report and record review ?focus on medication adherence by routine ?  ?  ? ?Patient verbalizes understanding of instructions and care plan provided today and agrees to view in [place]. Active MyChart status confirmed with patient.   ?Telephone follow up appointment with pharmacy team member scheduled for: 6 months ? ?Charlene Brooke, PharmD, BCACP ?Clinical Pharmacist ?[facility] at [facility] ?[phone] ?  ?

## 2022-05-01 ENCOUNTER — Ambulatory Visit (INDEPENDENT_AMBULATORY_CARE_PROVIDER_SITE_OTHER): Payer: Medicare Other | Admitting: Family Medicine

## 2022-05-01 ENCOUNTER — Encounter: Payer: Self-pay | Admitting: Family Medicine

## 2022-05-01 ENCOUNTER — Ambulatory Visit (INDEPENDENT_AMBULATORY_CARE_PROVIDER_SITE_OTHER)
Admission: RE | Admit: 2022-05-01 | Discharge: 2022-05-01 | Disposition: A | Discharge status: Home / Self Care | Payer: Medicare Other | Source: Ambulatory Visit | Attending: Family Medicine | Admitting: Family Medicine

## 2022-05-01 VITALS — BP 158/80 | HR 84 | Temp 97.4°F | Ht 69.0 in | Wt 176.0 lb

## 2022-05-01 DIAGNOSIS — R1030 Lower abdominal pain, unspecified: Secondary | ICD-10-CM

## 2022-05-01 DIAGNOSIS — M1612 Unilateral primary osteoarthritis, left hip: Secondary | ICD-10-CM | POA: Diagnosis not present

## 2022-05-01 DIAGNOSIS — M7989 Other specified soft tissue disorders: Secondary | ICD-10-CM | POA: Diagnosis not present

## 2022-05-01 MED ORDER — OXYCODONE-ACETAMINOPHEN 5-325 MG PO TABS: 0.5000 | ORAL_TABLET | Freq: Two times a day (BID) | ORAL | Status: DC | PRN

## 2022-05-01 MED ORDER — ALBUTEROL SULFATE HFA 108 (90 BASE) MCG/ACT IN AERS: 1.0000 | INHALATION_SPRAY | Freq: Four times a day (QID) | RESPIRATORY_TRACT | 3 refills | Status: DC | PRN

## 2022-05-01 MED ORDER — OXYCODONE-ACETAMINOPHEN 5-325 MG PO TABS: 0.5000 | ORAL_TABLET | Freq: Three times a day (TID) | ORAL | 0 refills | Status: DC | PRN

## 2022-05-01 NOTE — Patient Instructions (Signed)
Xray on the way out.  ?We'll call about the ultrasound ?Use 1/2 tab of oxycodone if needed in the meantime.  Sedation caution.   ?Take care.  Glad to see you. ?

## 2022-05-01 NOTE — Progress Notes (Signed)
Discussed with patient about potentially following up with hematology.  See following phone note.  Still anticoagulated. ? ?L lower back pain.  Depending on how he is laying or walking or sitting, he'll have a pain in the L groin, but can radiate down to the L knee through the L anterior thigh.  Still on xarelto.  No falls but his balance doesn't feel a good as prev.  No foot drop.  His pain started in the midst of therapy, months ago.  No bulge or mass noted.   ?He is taking tylenol w/o much relief.  D/w pt about trying oxycodone- he has rx.  D/w pt about trying a 1/2 tab.   ? ?He has occ brief L sided chest discomfort.  Sudden, quick onset, quick resolution.  Can happen at rest, random.  Not exertional.  Is isn't escalating or worsening and has gotten better in the last few weeks.  Can still take a deep breath.   ? ?Nasal congestion noted.  No fevers.  Sneezing and rhinorrhea.   ? ?05/2021: Procedure: Lumbar Two-Three, Lumbar Three-Four, Lumbar Four-Five Laminectomy and Foraminotomy ? ?Meds, vitals, and allergies reviewed.  ? ?ROS: Per HPI unless specifically indicated in ROS section  ? ?Nad ?Ncat ?Neck supple, no LA ?Rrr ?Ctab ?L lower back is the sore area but not more ttp.  No CVA pain. ?L hip with pain on int rotation.   ?B calf not ttp.  ?Mild but L>R BLE edema.   ?Skin well perfused. ? ?35 minutes were devoted to patient care in this encounter (this includes time spent reviewing the patient's file/history, interviewing and examining the patient, counseling/reviewing plan with patient).  ? ? ?

## 2022-05-04 ENCOUNTER — Telehealth: Payer: Self-pay | Admitting: Family Medicine

## 2022-05-04 ENCOUNTER — Other Ambulatory Visit: Payer: Self-pay | Admitting: Family Medicine

## 2022-05-04 DIAGNOSIS — R1030 Lower abdominal pain, unspecified: Secondary | ICD-10-CM | POA: Insufficient documentation

## 2022-05-04 DIAGNOSIS — M7989 Other specified soft tissue disorders: Secondary | ICD-10-CM | POA: Insufficient documentation

## 2022-05-04 DIAGNOSIS — I82412 Acute embolism and thrombosis of left femoral vein: Secondary | ICD-10-CM

## 2022-05-04 MED ORDER — RIVAROXABAN 20 MG PO TABS: 20.0000 mg | ORAL_TABLET | Freq: Every day | ORAL | 12 refills | Status: DC

## 2022-05-04 NOTE — Assessment & Plan Note (Signed)
Unlikely to be DVT given his current anticoagulation.  Continue anticoagulation.  Reasonable to check ultrasound given the swelling in his history.  We will set that up. ?

## 2022-05-04 NOTE — Assessment & Plan Note (Signed)
This could be coming from his back or it could be coming from his hip.  We talked about checking plain films of his hip today.  He can try oxycodone as needed with routine opiate cautions.  He agrees with plan.  Still okay for outpatient follow-up. ?

## 2022-05-04 NOTE — Telephone Encounter (Addendum)
Please notify patient when hip x-ray is resulted.  I need to addend 1 consideration.  I checked back on his notes from Dr. Irene Limbo, specifically 04/30/2020, and he stated that he "Will see (the patient) back as needed".  It does not appear that he has to follow-up with Dr. Irene Limbo at this point.  We can refer him back in the future if needed.  I would continue anticoagulation as is.  Thanks.  ?

## 2022-05-05 ENCOUNTER — Ambulatory Visit (INDEPENDENT_AMBULATORY_CARE_PROVIDER_SITE_OTHER): Payer: Medicare Other

## 2022-05-05 DIAGNOSIS — I639 Cerebral infarction, unspecified: Secondary | ICD-10-CM

## 2022-05-05 LAB — CUP PACEART REMOTE DEVICE CHECK
Date Time Interrogation Session: 20230511142716
Implantable Pulse Generator Implant Date: 20210525

## 2022-05-07 NOTE — Telephone Encounter (Signed)
Patient has been notified

## 2022-05-09 ENCOUNTER — Telehealth: Payer: Self-pay | Admitting: Family Medicine

## 2022-05-09 ENCOUNTER — Ambulatory Visit (HOSPITAL_COMMUNITY)
Admission: RE | Admit: 2022-05-09 | Discharge: 2022-05-09 | Disposition: A | Discharge status: Home / Self Care | Payer: Medicare Other | Source: Ambulatory Visit | Attending: Family Medicine | Admitting: Family Medicine

## 2022-05-09 ENCOUNTER — Telehealth: Payer: Self-pay

## 2022-05-09 DIAGNOSIS — M549 Dorsalgia, unspecified: Secondary | ICD-10-CM

## 2022-05-09 DIAGNOSIS — M7989 Other specified soft tissue disorders: Secondary | ICD-10-CM

## 2022-05-09 NOTE — Telephone Encounter (Signed)
See other phone note.  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

## 2022-05-09 NOTE — Telephone Encounter (Signed)
[facility] with Vascular department called with report on the patient. US showed NO DVT or superficial thrombus. 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 Spoke with Dr Damita Dunnings and transferred call to him to speak with [facility].

## 2022-05-09 NOTE — Telephone Encounter (Signed)
I talked with patient about call report.  He doesn't have calf pain or claudication but has back and thigh pain.  No DVT and he has collateral at popliteal area so there isn't concern for pending vascular compromise.  He is still anticoagulated.  Oxycodone helped his pain some in the meantime.  D/w pt about options.  No DVT, would still continue anticoagulation.  I suspect his back and thigh pain could be related to his back and I think it makes sense to see spine clinic.  He has seen Dr. Saintclair Halsted in the past and I put in referral for eval there.  He has pain medicine to use in the meantime and will update me as needed.  I thank all involved.

## 2022-05-10 ENCOUNTER — Other Ambulatory Visit: Payer: Self-pay | Admitting: Physician Assistant

## 2022-05-18 ENCOUNTER — Other Ambulatory Visit: Payer: Self-pay | Admitting: Cardiovascular Disease

## 2022-05-21 ENCOUNTER — Telehealth: Payer: Self-pay

## 2022-05-21 DIAGNOSIS — M549 Dorsalgia, unspecified: Secondary | ICD-10-CM

## 2022-05-21 DIAGNOSIS — E1159 Type 2 diabetes mellitus with other circulatory complications: Secondary | ICD-10-CM | POA: Diagnosis not present

## 2022-05-21 DIAGNOSIS — F17290 Nicotine dependence, other tobacco product, uncomplicated: Secondary | ICD-10-CM

## 2022-05-21 DIAGNOSIS — I2510 Atherosclerotic heart disease of native coronary artery without angina pectoris: Secondary | ICD-10-CM | POA: Diagnosis not present

## 2022-05-21 DIAGNOSIS — I10 Essential (primary) hypertension: Secondary | ICD-10-CM

## 2022-05-21 DIAGNOSIS — E785 Hyperlipidemia, unspecified: Secondary | ICD-10-CM | POA: Diagnosis not present

## 2022-05-21 NOTE — Addendum Note (Signed)
Addended by: Tonia Ghent on: 05/21/2022 06:55 PM   Modules accepted: Orders

## 2022-05-21 NOTE — Telephone Encounter (Signed)
Patient's wife says that Nathan Fernandez came in a few days ago for back and hip pain. Was referred to his spine specialists, but they dont have any appointments until July and recommended that [facility] orders for an MRI so they know if they need to work him in sooner for an appointment.

## 2022-05-21 NOTE — Telephone Encounter (Signed)
Please update patient.  I put in the order for MRI.  Thanks.

## 2022-05-23 ENCOUNTER — Encounter: Payer: Self-pay | Admitting: *Deleted

## 2022-05-23 ENCOUNTER — Telehealth: Payer: Self-pay | Admitting: Family Medicine

## 2022-05-23 NOTE — Telephone Encounter (Signed)
Called but no answer and VM is full

## 2022-05-23 NOTE — Progress Notes (Signed)
Carelink Summary Report / Loop Recorder 

## 2022-05-23 NOTE — Telephone Encounter (Signed)
Wife called back and has been informed that MRI has been ordered and will get a call to schedule.

## 2022-05-23 NOTE — Telephone Encounter (Signed)
Pt wife called and stated that ya'll called but could not lvm . Pt  wife stated that it's in reference to a MRI. Pt wife can be reached @ [phone]

## 2022-05-23 NOTE — Telephone Encounter (Signed)
Patient's wife called back and informed her that MRI has been ordered

## 2022-06-07 ENCOUNTER — Ambulatory Visit
Admission: RE | Admit: 2022-06-07 | Discharge: 2022-06-07 | Disposition: A | Discharge status: Home / Self Care | Payer: Medicare Other | Source: Ambulatory Visit | Attending: Family Medicine | Admitting: Family Medicine

## 2022-06-07 DIAGNOSIS — M545 Low back pain, unspecified: Secondary | ICD-10-CM | POA: Diagnosis not present

## 2022-06-07 DIAGNOSIS — M48061 Spinal stenosis, lumbar region without neurogenic claudication: Secondary | ICD-10-CM | POA: Diagnosis not present

## 2022-06-07 DIAGNOSIS — M549 Dorsalgia, unspecified: Secondary | ICD-10-CM

## 2022-06-09 ENCOUNTER — Ambulatory Visit (INDEPENDENT_AMBULATORY_CARE_PROVIDER_SITE_OTHER): Payer: Medicare Other

## 2022-06-09 DIAGNOSIS — I639 Cerebral infarction, unspecified: Secondary | ICD-10-CM

## 2022-06-10 LAB — CUP PACEART REMOTE DEVICE CHECK
Date Time Interrogation Session: 20230612230218
Implantable Pulse Generator Implant Date: 20210525

## 2022-06-13 ENCOUNTER — Other Ambulatory Visit: Payer: Self-pay | Admitting: Family Medicine

## 2022-06-13 MED ORDER — OXYCODONE-ACETAMINOPHEN 5-325 MG PO TABS: 0.5000 | ORAL_TABLET | Freq: Three times a day (TID) | ORAL | 0 refills | Status: DC | PRN

## 2022-06-25 ENCOUNTER — Other Ambulatory Visit: Payer: Self-pay | Admitting: Family Medicine

## 2022-06-30 NOTE — Progress Notes (Signed)
Carelink Summary Report / Loop Recorder 

## 2022-07-03 DIAGNOSIS — M48062 Spinal stenosis, lumbar region with neurogenic claudication: Secondary | ICD-10-CM | POA: Diagnosis not present

## 2022-07-04 ENCOUNTER — Other Ambulatory Visit: Payer: Self-pay | Admitting: Neurosurgery

## 2022-07-04 ENCOUNTER — Other Ambulatory Visit: Payer: Self-pay | Admitting: Cardiovascular Disease

## 2022-07-08 ENCOUNTER — Telehealth: Payer: Self-pay | Admitting: Family Medicine

## 2022-07-08 MED ORDER — OXYCODONE-ACETAMINOPHEN 5-325 MG PO TABS: 0.5000 | ORAL_TABLET | Freq: Three times a day (TID) | ORAL | 0 refills | Status: DC | PRN

## 2022-07-08 NOTE — Telephone Encounter (Signed)
Sent. Thanks.   

## 2022-07-08 NOTE — Telephone Encounter (Signed)
LOV - 05/01/22 NOV - not scheduled RF - 06/13/22 #20/0

## 2022-07-08 NOTE — Addendum Note (Signed)
Addended by: Tonia Ghent on: 07/08/2022 01:41 PM   Modules accepted: Orders

## 2022-07-08 NOTE — Telephone Encounter (Signed)
  Encourage patient to contact the pharmacy for refills or they can request refills through [facility]  Did the patient contact the pharmacy: Yes   LAST APPOINTMENT DATE: 05/01/22  NEXT APPOINTMENT DATE: N/A  MEDICATION: oxyCODONE-acetaminophen (PERCOCET/ROXICET) 5-325 MG tablet  Is the patient out of medication? No  If not, how much is left? 3 pills left  Is this a 90 day supply:   [facility] [id number] [place] [id number]  Let patient know to contact pharmacy at the end of the day to make sure medication is ready.  Please notify patient to allow 48-72 hours to process

## 2022-07-14 ENCOUNTER — Ambulatory Visit (INDEPENDENT_AMBULATORY_CARE_PROVIDER_SITE_OTHER): Payer: Medicare Other

## 2022-07-14 DIAGNOSIS — I639 Cerebral infarction, unspecified: Secondary | ICD-10-CM | POA: Diagnosis not present

## 2022-07-14 LAB — CUP PACEART REMOTE DEVICE CHECK
Date Time Interrogation Session: 20230715230745
Implantable Pulse Generator Implant Date: 20210525

## 2022-07-19 ENCOUNTER — Other Ambulatory Visit: Payer: Self-pay | Admitting: Family Medicine

## 2022-07-20 ENCOUNTER — Other Ambulatory Visit: Payer: Self-pay | Admitting: Cardiovascular Disease

## 2022-07-21 ENCOUNTER — Other Ambulatory Visit: Payer: Self-pay | Admitting: Family Medicine

## 2022-07-23 ENCOUNTER — Other Ambulatory Visit: Payer: Self-pay

## 2022-07-23 ENCOUNTER — Other Ambulatory Visit: Payer: Self-pay | Admitting: Cardiovascular Disease

## 2022-07-23 ENCOUNTER — Encounter (HOSPITAL_COMMUNITY)
Admission: RE | Admit: 2022-07-23 | Discharge: 2022-07-23 | Disposition: A | Discharge status: Home / Self Care | Payer: Medicare Other | Source: Ambulatory Visit | Attending: Neurosurgery | Admitting: Neurosurgery

## 2022-07-23 ENCOUNTER — Encounter (HOSPITAL_COMMUNITY): Payer: Self-pay

## 2022-07-23 VITALS — BP 159/94 | HR 65 | Temp 97.6°F | Resp 17 | Ht 69.0 in | Wt 175.7 lb

## 2022-07-23 DIAGNOSIS — M48061 Spinal stenosis, lumbar region without neurogenic claudication: Secondary | ICD-10-CM | POA: Diagnosis not present

## 2022-07-23 DIAGNOSIS — E119 Type 2 diabetes mellitus without complications: Secondary | ICD-10-CM | POA: Diagnosis not present

## 2022-07-23 DIAGNOSIS — I4510 Unspecified right bundle-branch block: Secondary | ICD-10-CM | POA: Diagnosis not present

## 2022-07-23 DIAGNOSIS — Z7901 Long term (current) use of anticoagulants: Secondary | ICD-10-CM | POA: Insufficient documentation

## 2022-07-23 DIAGNOSIS — Z86718 Personal history of other venous thrombosis and embolism: Secondary | ICD-10-CM | POA: Diagnosis not present

## 2022-07-23 DIAGNOSIS — Z8616 Personal history of COVID-19: Secondary | ICD-10-CM | POA: Diagnosis not present

## 2022-07-23 DIAGNOSIS — I5042 Chronic combined systolic (congestive) and diastolic (congestive) heart failure: Secondary | ICD-10-CM | POA: Insufficient documentation

## 2022-07-23 DIAGNOSIS — I252 Old myocardial infarction: Secondary | ICD-10-CM | POA: Insufficient documentation

## 2022-07-23 DIAGNOSIS — I110 Hypertensive heart disease with heart failure: Secondary | ICD-10-CM | POA: Diagnosis not present

## 2022-07-23 DIAGNOSIS — I724 Aneurysm of artery of lower extremity: Secondary | ICD-10-CM | POA: Diagnosis not present

## 2022-07-23 DIAGNOSIS — I493 Ventricular premature depolarization: Secondary | ICD-10-CM | POA: Diagnosis not present

## 2022-07-23 DIAGNOSIS — J449 Chronic obstructive pulmonary disease, unspecified: Secondary | ICD-10-CM | POA: Diagnosis not present

## 2022-07-23 DIAGNOSIS — Z01818 Encounter for other preprocedural examination: Secondary | ICD-10-CM | POA: Diagnosis not present

## 2022-07-23 DIAGNOSIS — I2510 Atherosclerotic heart disease of native coronary artery without angina pectoris: Secondary | ICD-10-CM | POA: Diagnosis not present

## 2022-07-23 DIAGNOSIS — Z85828 Personal history of other malignant neoplasm of skin: Secondary | ICD-10-CM | POA: Diagnosis not present

## 2022-07-23 DIAGNOSIS — Z9861 Coronary angioplasty status: Secondary | ICD-10-CM | POA: Diagnosis not present

## 2022-07-23 DIAGNOSIS — Z79899 Other long term (current) drug therapy: Secondary | ICD-10-CM | POA: Diagnosis not present

## 2022-07-23 HISTORY — DX: Aneurysm of artery of lower extremity: I72.4

## 2022-07-23 LAB — BASIC METABOLIC PANEL
Anion gap: 8 (ref 5–15)
BUN: 14 mg/dL (ref 8–23)
CO2: 29 mmol/L (ref 22–32)
Calcium: 9.1 mg/dL (ref 8.9–10.3)
Chloride: 103 mmol/L (ref 98–111)
Creatinine, Ser: 1.06 mg/dL (ref 0.61–1.24)
GFR, Estimated: >60 mL/min (ref >60)
Glucose, Bld: 127 mg/dL — ABNORMAL HIGH (ref 70–99)
Potassium: 4.1 mmol/L (ref 3.5–5.1)
Sodium: 140 mmol/L (ref 135–145)

## 2022-07-23 LAB — CBC
HCT: 42.1 % (ref 39.0–52.0)
Hemoglobin: 14.4 g/dL (ref 13.0–17.0)
MCH: 31.6 pg (ref 26.0–34.0)
MCHC: 34.2 g/dL (ref 30.0–36.0)
MCV: 92.5 fL (ref 80.0–100.0)
Platelets: 202 10*3/uL (ref 150–400)
RBC: 4.55 MIL/uL (ref 4.22–5.81)
RDW: 12.6 % (ref 11.5–15.5)
WBC: 7.4 10*3/uL (ref 4.0–10.5)
nRBC: 0 % (ref 0.0–0.2)

## 2022-07-23 LAB — SURGICAL PCR SCREEN
MRSA, PCR: NEGATIVE
Staphylococcus aureus: NEGATIVE

## 2022-07-23 LAB — HEMOGLOBIN A1C
Hgb A1c MFr Bld: 7 % — ABNORMAL HIGH (ref 4.8–5.6)
Mean Plasma Glucose: 154.2 mg/dL

## 2022-07-23 NOTE — Progress Notes (Signed)
PCP - Elsie Stain Cardiologist - Thompson Grayer and Jenkins Rouge  PPM/ICD - implantable loop recorder   Chest x-ray - n/a EKG - 07/23/22 Stress Test - 01/12/19 ECHO - 02/12/21 Cardiac Cath - 02/13/21  Sleep Study - denies   Does not check CBG at home. Diagnosed with diabetes type 2- diet controlled. Was on metformin years ago and new pcp discontinued it- has been fine every since.   As of today, STOP taking any Aspirin (unless otherwise instructed by your surgeon) Aleve, Naproxen, Ibuprofen, Motrin, Advil, Goody's, BC's, all herbal medications, fish oil, and all vitamins.   Please follow your surgeons instuctions in regards to stopping rivaroxaban (XARELTO). Please reach out to Dr. Damita Dunnings or prescribing physician for further instruction.  ERAS Protcol -no   COVID TEST- no   Anesthesia review: yes, cardiac history  Patient denies shortness of breath, fever, cough and chest pain at PAT appointment   All instructions explained to the patient, with a verbal understanding of the material. Patient agrees to go over the instructions while at home for a better understanding. Patient also instructed to self quarantine after being tested for COVID-19. The opportunity to ask questions was provided.

## 2022-07-23 NOTE — Progress Notes (Addendum)
Surgical Instructions    Your procedure is scheduled on Monday, 07/28/22.  Report to [facility] Main Entrance "A" at 9:05 A.M., then check in with the Admitting office.  Call this number if you have problems the morning of surgery:  [phone]   If you have any questions prior to your surgery date call [phone]: Open Monday-Friday 8am-4pm    Remember:  Do not eat or drink after midnight the night before your surgery     Take these medicines the morning of surgery with A SIP OF WATER:  acetaminophen (TYLENOL) **carvedilol (COREG) ** dorzolamide-timolol (COSOPT)  fenofibrate (TRICOR)  gabapentin (NEURONTIN) hydrALAZINE (APRESOLINE) isosorbide mononitrate (IMDUR) rosuvastatin (CRESTOR)   IF NEEDED: albuterol (VENTOLIN HFA) inhaler- bring with you the day of surgery cyclobenzaprine (FLEXERIL) oxyCODONE-acetaminophen (PERCOCET/ROXICET)  Polyethyl Glycol-Propyl Glycol (SYSTANE) 0.4-0.3 % GEL ophthalmic gel  As of today, STOP taking any Aspirin (unless otherwise instructed by your surgeon) Aleve, Naproxen, Ibuprofen, Motrin, Advil, Goody's, BC's, all herbal medications, fish oil, and all vitamins.  Please follow your surgeons instuctions in regards to stopping rivaroxaban (XARELTO). Please reach out to Dr. Damita Dunnings or prescribing physician for further instruction.  WHAT DO I DO ABOUT MY DIABETES MEDICATION?   Do not take oral diabetes medicines (pills) the morning of surgery.   The day of surgery, do not take other diabetes injectables, including Byetta (exenatide), Bydureon (exenatide ER), Victoza (liraglutide), or Trulicity (dulaglutide).  If your CBG is greater than 220 mg/dL, you may take  of your sliding scale (correction) dose of insulin.   HOW TO MANAGE YOUR DIABETES BEFORE AND AFTER SURGERY  Why is it important to control my blood sugar before and after surgery? Improving blood sugar levels before and after surgery helps healing and can limit problems. A way of  improving blood sugar control is eating a healthy diet by:  Eating less sugar and carbohydrates  Increasing activity/exercise  Talking with your doctor about reaching your blood sugar goals High blood sugars (greater than 180 mg/dL) can raise your risk of infections and slow your recovery, so you will need to focus on controlling your diabetes during the weeks before surgery. Make sure that the doctor who takes care of your diabetes knows about your planned surgery including the date and location.  How do I manage my blood sugar before surgery? Check your blood sugar at least 4 times a day, starting 2 days before surgery, to make sure that the level is not too high or low.  Check your blood sugar the morning of your surgery when you wake up and every 2 hours until you get to the Short Stay unit.  If your blood sugar is less than 70 mg/dL, you will need to treat for low blood sugar: Do not take insulin. Treat a low blood sugar (less than 70 mg/dL) with  cup of clear juice (cranberry or apple), 4 glucose tablets, OR glucose gel. Recheck blood sugar in 15 minutes after treatment (to make sure it is greater than 70 mg/dL). If your blood sugar is not greater than 70 mg/dL on recheck, call [phone] for further instructions. Report your blood sugar to the short stay nurse when you get to Short Stay.  If you are admitted to the hospital after surgery: Your blood sugar will be checked by the staff and you will probably be given insulin after surgery (instead of oral diabetes medicines) to make sure you have good blood sugar levels. The goal for blood sugar control after surgery is 80-180  mg/dL.           Do not wear jewelry or makeup. Do not wear lotions, powders, perfumes/cologne or deodorant. Men may shave face and neck. Do not bring valuables to the hospital. Do not wear nail polish, gel polish, artificial nails, or any other type of covering on natural nails (fingers and toes) If you have  artificial nails or gel coating that need to be removed by a nail salon, please have this removed prior to surgery. Artificial nails or gel coating may interfere with anesthesia's ability to adequately monitor your vital signs.  [facility] is not responsible for any belongings or valuables.    Do NOT Smoke (Tobacco/Vaping)  24 hours prior to your procedure  If you use a CPAP at night, you may bring your mask for your overnight stay.   Contacts, glasses, hearing aids, dentures or partials may not be worn into surgery, please bring cases for these belongings   For patients admitted to the hospital, discharge time will be determined by your treatment team.   Patients discharged the day of surgery will not be allowed to drive home, and someone needs to stay with them for 24 hours.   SURGICAL WAITING ROOM VISITATION Patients having surgery or a procedure may have no more than 2 support people in the waiting area - these visitors may rotate.   Children under the age of 69 must have an adult with them who is not the patient. If the patient needs to stay at the hospital during part of their recovery, the visitor guidelines for inpatient rooms apply. Pre-op nurse will coordinate an appropriate time for 1 support person to accompany patient in pre-op.  This support person may not rotate.   Please refer to the [facility] website for the visitor guidelines for Inpatients (after your surgery is over and you are in a regular room).    Special instructions:    Oral Hygiene is also important to reduce your risk of infection.  Remember - BRUSH YOUR TEETH THE MORNING OF SURGERY WITH YOUR REGULAR TOOTHPASTE   - Preparing For Surgery  Before surgery, you can play an important role. Because skin is not sterile, your skin needs to be as free of germs as possible. You can reduce the number of germs on your skin by washing with CHG (chlorahexidine gluconate) Soap before surgery.  CHG is an  antiseptic cleaner which kills germs and bonds with the skin to continue killing germs even after washing.     Please do not use if you have an allergy to CHG or antibacterial soaps. If your skin becomes reddened/irritated stop using the CHG.  Do not shave (including legs and underarms) for at least 48 hours prior to first CHG shower. It is OK to shave your face.  Please follow these instructions carefully.     Shower the NIGHT BEFORE SURGERY and the MORNING OF SURGERY with CHG Soap.   If you chose to wash your hair, wash your hair first as usual with your normal shampoo. After you shampoo, rinse your hair and body thoroughly to remove the shampoo.  Then [employer] and genitals (private parts) with your normal soap and rinse thoroughly to remove soap.  After that Use CHG Soap as you would any other liquid soap. You can apply CHG directly to the skin and wash gently with a scrungie or a clean washcloth.   Apply the CHG Soap to your body ONLY FROM THE NECK DOWN.  Do not use on open wounds or open sores. Avoid contact with your eyes, ears, mouth and genitals (private parts). Wash Face and genitals (private parts)  with your normal soap.   Wash thoroughly, paying special attention to the area where your surgery will be performed.  Thoroughly rinse your body with warm water from the neck down.  DO NOT shower/wash with your normal soap after using and rinsing off the CHG Soap.  Pat yourself dry with a CLEAN TOWEL.  Wear CLEAN PAJAMAS to bed the night before surgery  Place CLEAN SHEETS on your bed the night before your surgery  DO NOT SLEEP WITH PETS.   Day of Surgery: Take a shower with CHG soap. Wear Clean/Comfortable clothing the morning of surgery Do not apply any deodorants/lotions.   Remember to brush your teeth WITH YOUR REGULAR TOOTHPASTE.    If you received a COVID test during your pre-op visit, it is requested that you wear a mask when out in public, stay away from anyone  that may not be feeling well, and notify your surgeon if you develop symptoms. If you have been in contact with anyone that has tested positive in the last 10 days, please notify your surgeon.    Please read over the following fact sheets that you were given.

## 2022-07-24 ENCOUNTER — Telehealth: Payer: Self-pay | Admitting: Cardiovascular Disease

## 2022-07-24 ENCOUNTER — Telehealth: Payer: Self-pay | Admitting: Family Medicine

## 2022-07-24 ENCOUNTER — Encounter (HOSPITAL_COMMUNITY): Payer: Self-pay

## 2022-07-24 ENCOUNTER — Ambulatory Visit: Payer: Medicare Other | Admitting: Interventional Cardiology

## 2022-07-24 ENCOUNTER — Encounter: Payer: Self-pay | Admitting: Interventional Cardiology

## 2022-07-24 VITALS — BP 160/100 | HR 75 | Ht 69.0 in | Wt 176.4 lb

## 2022-07-24 DIAGNOSIS — I25118 Atherosclerotic heart disease of native coronary artery with other forms of angina pectoris: Secondary | ICD-10-CM | POA: Diagnosis not present

## 2022-07-24 DIAGNOSIS — E785 Hyperlipidemia, unspecified: Secondary | ICD-10-CM

## 2022-07-24 DIAGNOSIS — Z01810 Encounter for preprocedural cardiovascular examination: Secondary | ICD-10-CM

## 2022-07-24 NOTE — Patient Instructions (Addendum)
Medication Instructions:  Your physician recommends that you continue on your current medications as directed. Please refer to the Current Medication list given to you today.  *If you need a refill on your cardiac medications before your next appointment, please call your pharmacy*   Lab Work: none If you have labs (blood work) drawn today and your tests are completely normal, you will receive your results only by: [facility] (if you have MyChart) OR A paper copy in the mail If you have any lab test that is abnormal or we need to change your treatment, we will call you to review the results.   Testing/Procedures: none   Follow-Up: At [facility], you and your health needs are our priority.  As part of our continuing mission to provide you with exceptional heart care, we have created designated Provider Care Teams.  These Care Teams include your primary Cardiologist (physician) and Advanced Practice Providers (APPs -  Physician Assistants and Nurse Practitioners) who all work together to provide you with the care you need, when you need it.  We recommend signing up for the patient portal called "MyChart".  Sign up information is provided on this After Visit Summary.  MyChart is used to connect with patients for Virtual Visits (Telemedicine).  Patients are able to view lab/test results, encounter notes, upcoming appointments, etc.  Non-urgent messages can be sent to your provider as well.   To learn more about what you can do with MyChart, go to [email or web address].    Your next appointment:   6 month(s)  The format for your next appointment:   In Person  Provider:   Jenkins Rouge, MD     Other Instructions    Important Information About Sugar

## 2022-07-24 NOTE — Telephone Encounter (Signed)
LMTCB

## 2022-07-24 NOTE — Telephone Encounter (Signed)
Procedure is:  L2-3 SUBLAMINAR DECOMPRESSION

## 2022-07-24 NOTE — Progress Notes (Addendum)
Anesthesia Chart Review:  Case: [id number] Date/Time: 07/28/22 1050   Procedure: Sublaminar decompression - L2-L3 - bilateral redo (Bilateral: Back) - 3C   Anesthesia type: General   Pre-op diagnosis: Stenosis   Location: [facility] ROOM 20 / [facility]   Surgeons: Kary Kos, MD       DISCUSSION: Patient is an 81 year old male scheduled for the above procedure.  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   Last cardiology visit was on 05/13/21 with Dr. Johnsie Cancel. S/p NSTEMI 01/2021. LHC showed stable anatomy from 2020 and medical management recommended. Echo then showed LVEF 45-50% (stable since 2021), no new DOE. Allergic to ACEi/ARB. On Coreg, hydralazine, Imdur, ASA 81 mg, Crestor, Tricor, Nitro as needed. Six month follow-up planned. He is on Xarelto more for history of splenic embolism which is managed by PCP Dr. Damita Dunnings.   Currently last Xarelto 07/23/22 PM--Patient's wife has reached out to Dr. Damita Dunnings on 07/24/22 to confirm Xarelto instructions (surgeon reportedly requested a 5 day hold). He had a newly occluded left popliteal artery aneurysm with collateral flow and resolution of LLE DVT by 05/09/22 [place].   Medtronic LINQII Loop Records remote interrogation  06/03/22-07/06/22: ILR summary report received. Battery status OK. Normal device function. No new symptom, tachy, brady, or pause episodes. No new AF episodes. AF burden is 0% of the time. Monthly summary reports and ROV/PRN  He has not had cardiology evaluation since May 2022 and has known CAD and CHF. Significant 2V CAD but with stable anatomy by 01/2021 LHC and continued medical therapy recommended. 07/23/22 EKG showed SR, LAD, RBBB, anteroseptal infarct, occasional PVC. PVC and anteroseptal infarct pattern new per interpreting cardiologist. Preoperative cardiology risk assessment recommended. Discussed with anesthesiologist Adele Barthel, MD. Lorriane Shire at Dr. Windy Carina office notified.    ADDENDUM 07/24/22 6:38 PM:  Dr. Damita Dunnings gave permission to hold Xarelto for 5 days prior to surgery.   Patient was also able to have a preoperative cardiology by Larae Grooms, MD today. He wrote, "Preoperative cardiovascular exam: No further cardiac testing needed before back surgery.  The patient is already started holding his Xarelto.  He discussed this with his primary care physician.  He is at moderate risk of cardiac complication due to his age and existing coronary artery disease.  No way to lower that risk.  He did fine with back surgery last year.  Therefore, I think it is fine for him to go ahead with surgery again.  He thinks this will be a shorter surgery than he had last year.  If there are problems, we can address them at that time."  Anesthesia team to evaluate on the day of surgery.   VS: BP (!) 159/94   Pulse 65   Temp 36.4 C (Oral)   Resp 17   Ht '5\' 9"'$  (1.753 m)   Wt 79.7 kg  SpO2 95%   BMI 25.95 kg/m    PROVIDERS: Tonia Ghent, MD is PCP  - Jenkins Rouge, MD is cardiologist. Last visit 05/13/21. Continue medical therapy for CAD.  No PAF on ILR. Six month follow-up planned.  Thompson Grayer, MD is EP cardiologist (loop recorder) - Sullivan Lone, MD is hematologist. Last visit  04/30/20 with as needed follow-up. Long-term Xarelto planned due to no known provoking events causing splenic and left renal infarct. Discussed 04/16/20 "Lupus anticoagulant may be falsely positive as he is currently on Xarelto. To confirm, would have to wait 3 months and repeat test after holding Xarelto for 48 hours. Advised pt that no matter the outcome of this test life-long blood thinners would still be recommended."   LABS: Labs reviewed: Acceptable for surgery. (all labs ordered are listed, but only abnormal results are displayed)  Labs Reviewed  BASIC METABOLIC PANEL - Abnormal; Notable for the following components:      Result Value   Glucose, Bld 127 (*)    All other components within normal limits  HEMOGLOBIN A1C - Abnormal; Notable for the following components:   Hgb A1c MFr Bld 7.0 (*)    All other components within normal limits  SURGICAL PCR SCREEN  CBC     IMAGES: MRI L-spine 06/07/22: IMPRESSION: 1. Wide laminectomy at L3 and L4 with decompression of the central canal. 2. Residual leftward disc protrusion and facet hypertrophy at L3-4 with moderate subarticular narrowing, left greater than right. Moderate to severe left and moderate right foraminal stenosis has progressed. 3. Mild subarticular and moderate foraminal stenosis bilaterally at L4-5 has progressed. 4. Mild subarticular and foraminal narrowing bilaterally at L5-S1 has progressed. 5. Moderate central canal stenosis at L2-3 with moderate right and mild left foraminal narrowing has progressed.   EKG: 07/23/22: Sinus rhythm with 1st degree A-V block with occasional Premature ventricular complexes Left axis deviation Right bundle branch block Anteroseptal infarct , age undetermined Abnormal ECG When compared with ECG of 14-Feb-2021 08:46, anteroseptal infact pattern noted, PVCs now present Confirmed by Ida Rogue [phone]) on 07/23/2022 9:54:15 PM   CV: US venous, LLE 05/09/22: Summary:  LEFT:  -  There is no evidence of deep vein thrombosis in the lower extremity.  - There is no evidence of superficial venous thrombosis.  - Popliteal artery aneurysm measuring 2.9 x 3.2 cm with near occlusion.  Inflow velocity 9 cm/s. Outflow velocity 10 cm/s. There is a large  collateral just proximal to the aneurysm. Patient does not complain of  claucication.  - Comparison [place] 01/04/22: nonocclusive DVT in the cephalad course of left femoral vein and in the left deep femoral vein, 5.5 x 3.3 x 2.9 cm complex structure in the left popliteal fossa in subcutaneous plane.     Cardiac cath 02/13/21 [place] Anon, Rogue Jury, MD): Prox RCA lesion is 50% stenosed. Prox RCA to Mid RCA lesion is 60% stenosed. Dist RCA lesion is 20% stenosed. Ost LM to Mid LM lesion is 30% stenosed. Ost Cx to Prox Cx lesion is 30% stenosed. Mid Cx to Dist Cx lesion is 40% stenosed. Prox LAD to Mid LAD lesion is 70% stenosed. Mid LM to Dist LM lesion is 30% stenosed. 1st Diag lesion is 60% stenosed.   1.  Significant two-vessel coronary artery disease involving LAD and RCA.  The LAD stenosis is between 2 large aneurysmal segments and thus its difficult to estimate stenosis severity.  The coronary arteries are overall heavily calcified. 2.  Normal left ventricular end-diastolic  pressure.  Left ventricular angiography was not performed.  EF was mildly reduced by echo.   Recommendations: I do not see significant change in coronary anatomy since 2020.  It is hard to know the culprit for non-ST elevation myocardial infarction. Recommend adding aspirin to Xarelto. Resume heparin drip 6 hours after sheath pull and transition to Xarelto tomorrow. The LAD is not suitable for PCI.     Echo 02/12/21: IMPRESSIONS   1. Left ventricular ejection fraction, by estimation, is 45 to 50%. The  left ventricle has mildly decreased function. The left ventricle  demonstrates global hypokinesis. There is mild concentric left ventricular  hypertrophy.  Left ventricular diastolic  parameters are indeterminate.   2. Right ventricular systolic function is low normal. The right  ventricular size is mildly enlarged.   3. The mitral valve is normal in structure. Trivial mitral valve  regurgitation. No evidence of mitral stenosis.   4. The aortic valve is tricuspid. Aortic valve regurgitation is not  visualized. No aortic stenosis is present.   5. The inferior vena cava is normal in size with greater than 50%  respiratory variability, suggesting right atrial pressure of 3 mmHg.  - Comparison(s): Changes from prior study are noted. EF improved slightly  from prior. [LVEF 35-40% 04/05/20 TTE; 40-45% 05/15/20 TEE]     TEE 05/15/20: IMPRESSIONS   1. Left ventricular ejection fraction, by estimation, is 40 to 45%. The  left ventricle has mildly decreased function. The left ventricle  demonstrates global hypokinesis.   2. Right ventricular systolic function is normal. The right ventricular  size is normal.   3. Bilobed atrial appendage. No left atrial/left atrial appendage  thrombus was detected.   4. The mitral valve is normal in structure. Trivial mitral valve  regurgitation.   5. The aortic valve is tricuspid. Aortic valve regurgitation is not  visualized.   6. There is mild (Grade II) layered plaque involving the transverse and  descending aorta.   7. Agitated saline contrast bubble study was negative, with no evidence  of any interatrial shunt.  - Conclusion(s)/Recommendation(s): No cardiac source of emboli noted. 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    EP Procedure 05/15/20: CONCLUSIONS:   1. Successful implantation of a Medtronic Reveal LINQ implantable loop recorder for systemic embolism  2. No early apparent complications.    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   Hosp R facial weakness   CAD (coronary artery disease)    MI, [place], PTCA; LHC 04/18/13: Distal  left main 40%, proximal LAD with several aneurysmal segments, proximal LAD 50% prior to and after aneurysmal segments, area does not appear to flow-limiting, proximal diagonal 70%, ostial circumflex 20%, proximal circumflex 20%, OM1 30-40%, proximal RCA 40%, mid RCA 30%, distal RCA 20%, EF 50% => med Rx   Cancer ([place])    skin cancer - 2020   COPD (chronic obstructive pulmonary disease) (HCC)    DM2 (diabetes mellitus, type 2) ([place])    DVT (deep venous thrombosis) ([place]) 12/2021   History of ETT 1998   wnl   History of hiatal hernia    History of MRI 08/04/2009   brain- atrophy sm vess dz   HLD (hyperlipidemia)    HTN (hypertension)    Myocardial infarction (HCC)    OA (osteoarthritis)    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[place])    05/09/12 [place]: 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, large  collateral proximal to aneurysm   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 06/14/75    Past Surgical History:  Procedure Laterality Date   BUBBLE STUDY  05/15/2020   Procedure: BUBBLE STUDY;  Surgeon: Pixie Casino, MD;  Location[facility] ENDOSCOPY;  Service: Cardiovascular;;   CARDIAC CATHETERIZATION     cataract surgery  08/2005   repair lens which moved   [place] CATH AND CORONARY ANGIOGRAPHY N/A 01/21/2019   Procedure: LEFT HEART CATH AND CORONARY ANGIOGRAPHY;  Surgeon: Burnell Blanks, MD;  Location: [facility] CV LAB;  Service: Cardiovascular;  Laterality: N/A;   LEFT HEART CATH AND CORONARY ANGIOGRAPHY N/A 02/13/2021   Procedure: LEFT HEART CATH AND CORONARY ANGIOGRAPHY;  Surgeon: Wellington Hampshire, MD;  Location: [facility] CV LAB;  Service: Cardiovascular;  Laterality: N/A;   LOOP RECORDER INSERTION N/A 05/15/2020   Procedure: LOOP RECORDER INSERTION;  Surgeon: Thompson Grayer, MD;  Location: [facility] CV LAB;  Service: Cardiovascular;  Laterality: N/A;   LUMBAR LAMINECTOMY/DECOMPRESSION MICRODISCECTOMY N/A  06/19/2021   Procedure: Lumbar Two-Three, Lumbar Three-Four, Lumbar Four-Five Laminectomy and Foraminotomy;  Surgeon: Kary Kos, MD;  Location: [facility] [place];  Service: Neurosurgery;  Laterality: N/A;   stress myoview  06/29/2006   sm distal anteroseptal & apical infarct   TEE WITHOUT CARDIOVERSION N/A 05/15/2020   Procedure: TRANSESOPHAGEAL ECHOCARDIOGRAM (TEE);  Surgeon: Pixie Casino, MD;  Location: [facility] ENDOSCOPY;  Service: Cardiovascular;  Laterality: N/A;   THYROIDECTOMY, PARTIAL  1967   B9 growth   TONSILLECTOMY      MEDICATIONS:  acetaminophen (TYLENOL) 325 MG tablet   albuterol (VENTOLIN HFA) 108 (90 Base) MCG/ACT inhaler   Ascorbic Acid (VITAMIN C PO)   aspirin 81 MG EC tablet   B Complex CAPS   buPROPion (WELLBUTRIN SR) 150 MG 12 hr tablet   carvedilol (COREG) 6.25 MG tablet   Cholecalciferol (VITAMIN D-3 PO)   cyclobenzaprine (FLEXERIL) 5 MG tablet   dorzolamide-timolol (COSOPT) 22.3-6.8 MG/ML ophthalmic solution   ELDERBERRY PO   fenofibrate (TRICOR) 48 MG tablet   gabapentin (NEURONTIN) 100 MG capsule   hydrALAZINE (APRESOLINE) 10 MG tablet   isosorbide mononitrate (IMDUR) 30 MG 24 hr tablet   ketoconazole (NIZORAL) 2 % cream   nitroGLYCERIN (NITROSTAT) 0.4 MG SL tablet   oxyCODONE-acetaminophen (PERCOCET/ROXICET) 5-325 MG tablet   Polyethyl Glycol-Propyl Glycol (SYSTANE) 0.4-0.3 % GEL ophthalmic gel   rivaroxaban (XARELTO) 20 MG TABS tablet   rosuvastatin (CRESTOR) 20 MG tablet   traZODone (DESYREL) 100 MG tablet   No current facility-administered medications for this encounter.    Myra Gianotti, PA-C Surgical Short Stay/Anesthesiology [facility] Phone [phone] [facility] Phone [phone] 07/24/2022 12:14 PM

## 2022-07-24 NOTE — Telephone Encounter (Signed)
No further cardiac testing needed before surgery.

## 2022-07-24 NOTE — Anesthesia Preprocedure Evaluation (Addendum)
Anesthesia Evaluation  Patient identified by MRN, date of birth, ID band Patient awake    Reviewed: Allergy & Precautions, NPO status , Patient's Chart, lab work & pertinent test results, reviewed documented beta blocker date and time   History of Anesthesia Complications Negative for: history of anesthetic complications  Airway Mallampati: II  TM Distance: >3 FB Neck ROM: Full    Dental  (+) Edentulous Upper, Edentulous Lower   Pulmonary COPD, former smoker,    breath sounds clear to auscultation       Cardiovascular hypertension, Pt. on medications and Pt. on home beta blockers (-) angina+ CAD and + DVT   Rhythm:Regular Rate:Normal  '22 Cath: 1. Significant two-vessel coronary artery disease involving LAD and RCA. The LAD stenosis is between 2 large aneurysmal segments and thus its difficult to estimate stenosis severity. The coronary arteries are overall heavily calcified. 2. Normal left ventricular end-diastolic pressure. Left ventricular angiography was not performed. EF was mildly reduced by echo.  '22 ECHO: EF 40-45%, mildly decreased LVF, normal RVF, trivial MR   Neuro/Psych Depression H/o Bell's    GI/Hepatic Neg liver ROS, hiatal hernia,   Endo/Other  diabetes (glu 166)  Renal/GU negative Renal ROS     Musculoskeletal   Abdominal   Peds  Hematology Xarelto: last dose Tuesday   Anesthesia Other Findings   Reproductive/Obstetrics                           Anesthesia Physical Anesthesia Plan  ASA: 3  Anesthesia Plan: General   Post-op Pain Management: Tylenol PO (pre-op)*   Induction: Intravenous  PONV Risk Score and Plan: 2 and Ondansetron and Dexamethasone  Airway Management Planned: Oral ETT  Additional Equipment: Arterial line  Intra-op Plan:   Post-operative Plan: Extubation in OR  Informed Consent: I have reviewed the patients History and Physical, chart,  labs and discussed the procedure including the risks, benefits and alternatives for the proposed anesthesia with the patient or authorized representative who has indicated his/her understanding and acceptance.       Plan Discussed with: CRNA and Surgeon  Anesthesia Plan Comments: (PAT note written 07/24/2022 by Myra Gianotti, PA-C. )      Anesthesia Quick Evaluation

## 2022-07-24 NOTE — Telephone Encounter (Signed)
See notes pt has been cleared. I will fax notes to requesting office.

## 2022-07-24 NOTE — Telephone Encounter (Signed)
I think that holding xarelto for 5 days prior to surgery makes sense.  Then he can restart as soon as surgery given the okay, after his procedure.  Thanks.

## 2022-07-24 NOTE — Telephone Encounter (Signed)
   Pre-operative Risk Assessment    Patient Name: Nathan Fernandez  DOB: 01/22/1941 MRN: [id number]      Request for Surgical Clearance    Procedure:  Lumbar Decompression  Date of Surgery:  Clearance 07-28-22                                 Surgeon:  Dr Kary Kos Surgeon's Group or Practice Name:   Phone number:  [phone] Fax number:  [phone]   Type of Clearance Requested:   - Medical    Type of Anesthesia:  General    Additional requests/questions:    Signed, Glyn Ade   07/24/2022, 12:15 PM

## 2022-07-24 NOTE — Telephone Encounter (Signed)
I s/w the pt and his wife. I explained that pt's surgery is going to need to be postponed as he is going to need an appt in the office as he was last seen 04/2021.  Pt's wife is upset that the pt's surgery may be postponed. I reviewed schedules for [facility], [facility] and [facility], nothing until 07/28/22, which is the surgery date for the pt. I then  did ask Dr. Irish Lack, [place] today would he be able to see the pt today at 3:30. Dr. Irish Lack graciously agreed to see the pt. I informed the pt and his wife that we will see him today at 3:30 and to here be by 3:15 the latest for registration. Pt's wife did ask me to call Lorriane Shire, surgery scheduler and ask her to hold the surgery slot for 07/28/22. I informed the pt's wife that I will but that we are not sure if the doctor today will need any testing ordered.   Pt and his wife are agreeable to plan of care.

## 2022-07-24 NOTE — Telephone Encounter (Signed)
Spoke with patients wife and advised her okay to hold xarelto x 5 days and can restart as soon as cleared from surgery.

## 2022-07-24 NOTE — Telephone Encounter (Signed)
   Patient Name: Nathan Fernandez  DOB: [birth year] MRN: [id number]  Primary Cardiologist: Jenkins Rouge, MD  Chart reviewed as part of pre-operative protocol coverage. Given past medical history and time since last visit, based on ACC/AHA guidelines, Nathan Fernandez would be at acceptable risk for the planned procedure without further cardiovascular testing.    I will route this recommendation to the requesting party via Epic fax function and remove from pre-op pool.   Please continue ASA 81 mg if possible Please call with questions.  Mable Fill, Marissa Nestle, NP 07/24/2022, 4:58 PM

## 2022-07-24 NOTE — Telephone Encounter (Signed)
Patient wife Nathan Fernandez called in and stated he is having back surgery Monday at 11:00 AM, and doctor wants him to be off the Xarelto for 5 days prior to the surgery. Nathan Fernandez and Nathan Fernandez were wanting to make sure this is fine for him to be off of it Xarelto for 5 days. Took last pill Tuesday night. Return call at [phone]. Please advise. Thank you!

## 2022-07-24 NOTE — Progress Notes (Addendum)
Cardiology Office Note   Date:  07/24/2022   ID:  Nathan Fernandez, DOB Sep 28, 1941, MRN [id number]  PCP:  Tonia Ghent, MD    No chief complaint on file.  CAD  Wt Readings from Last 3 Encounters:  07/24/22 176 lb 6.4 oz (80 kg)  07/23/22 175 lb 11.2 oz (79.7 kg)  05/01/22 176 lb (79.8 kg)       History of Present Illness: Nathan Fernandez is a 81 y.o. male with a history of CAD.  Most recently, he had a non-STEMI in February 2022.  Cardiac cath was performed showing: "Cardiac catheterization 02/13/2021 Prox RCA lesion is 50% stenosed. Prox RCA to Mid RCA lesion is 60% stenosed. Dist RCA lesion is 20% stenosed. Ost LM to Mid LM lesion is 30% stenosed. Ost Cx to Prox Cx lesion is 30% stenosed. Mid Cx to Dist Cx lesion is 40% stenosed. Prox LAD to Mid LAD lesion is 70% stenosed. Mid LM to Dist LM lesion is 30% stenosed. 1st Diag lesion is 60% stenosed.     Diagnostic Dominance: Right     He needs back surgery with Dr. Saintclair Halsted.  He is here for urgent cardiac clearance.  Denies : Chest pain. Dizziness. Leg edema. Nitroglycerin use. Orthopnea. Palpitations. Paroxysmal nocturnal dyspnea. Shortness of breath. Syncope.    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   Hosp R facial weakness   CAD (coronary artery disease)    MI, [place], PTCA; [place] 04/18/13: Distal left main 40%, proximal LAD with several aneurysmal segments, proximal LAD 50% prior to and after aneurysmal segments, area does not appear to flow-limiting, proximal diagonal 70%, ostial circumflex 20%, proximal circumflex 20%, OM1 30-40%, proximal RCA 40%, mid RCA 30%, distal RCA 20%, EF 50% => med Rx   Cancer ([place])    skin cancer - 2020   COPD (chronic obstructive pulmonary disease) (HCC)    DM2 (diabetes mellitus, type 2) ([place])    DVT (deep venous thrombosis) ([place]) 12/2021   History of ETT 1998   wnl   History of hiatal hernia    History of MRI 08/04/2009   brain- atrophy sm vess dz    HLD (hyperlipidemia)    HTN (hypertension)    Myocardial infarction (HCC)    OA (osteoarthritis)    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[place])    05/09/12 [place]: 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, large collateral proximal to aneurysm   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 9/98/33    Past Surgical History:  Procedure Laterality Date   BUBBLE STUDY  05/15/2020   Procedure: BUBBLE STUDY;  Surgeon: Pixie Casino, MD;  Location: [place];  Service: Cardiovascular;;   CARDIAC CATHETERIZATION     cataract surgery  08/2005   repair lens which moved   [place] CATH AND CORONARY ANGIOGRAPHY N/A 01/21/2019   Procedure: LEFT HEART CATH AND CORONARY ANGIOGRAPHY;  Surgeon: Burnell Blanks, MD;  Location: [facility];  Service: Cardiovascular;  Laterality: N/A;   LEFT HEART CATH AND CORONARY ANGIOGRAPHY N/A 02/13/2021   Procedure: LEFT HEART CATH AND CORONARY ANGIOGRAPHY;  Surgeon: Wellington Hampshire, MD;  Location: [facility];  Service: Cardiovascular;  Laterality: N/A;   LOOP RECORDER INSERTION N/A 05/15/2020   Procedure: LOOP RECORDER INSERTION;  Surgeon: Thompson Grayer, MD;  Location: [facility];  Service:  Cardiovascular;  Laterality: N/A;   LUMBAR LAMINECTOMY/DECOMPRESSION MICRODISCECTOMY N/A 06/19/2021   Procedure: Lumbar Two-Three, Lumbar Three-Four, Lumbar Four-Five Laminectomy and Foraminotomy;  Surgeon: Kary Kos, MD;  Location: [facility];  Service: Neurosurgery;  Laterality: N/A;   stress myoview  06/29/2006   sm distal anteroseptal & apical infarct   TEE WITHOUT CARDIOVERSION N/A 05/15/2020   Procedure: TRANSESOPHAGEAL ECHOCARDIOGRAM (TEE);  Surgeon: Pixie Casino, MD;  Location: [facility];  Service: Cardiovascular;  Laterality: N/A;   THYROIDECTOMY, PARTIAL  1967   B9 growth   TONSILLECTOMY       Current Outpatient Medications  Medication Sig Dispense Refill    acetaminophen (TYLENOL) 325 MG tablet Take 1.5 tablets (487.5 mg total) by mouth 2 (two) times daily. (Patient taking differently: Take 650 mg by mouth 2 (two) times daily as needed for mild pain or headache.)     albuterol (VENTOLIN HFA) 108 (90 Base) MCG/ACT inhaler Inhale 1-2 puffs into the lungs every 6 (six) hours as needed (COPD). 1 each 3   Ascorbic Acid (VITAMIN C PO) Take 1 tablet by mouth daily.     aspirin 81 MG EC tablet Take 81 mg by mouth daily.     B Complex CAPS Take 1 capsule by mouth daily.     buPROPion (WELLBUTRIN SR) 150 MG 12 hr tablet TAKE 1 TABLET BY MOUTH EVERYDAY AT BEDTIME (Patient taking differently: Take 150 mg by mouth at bedtime.) 90 tablet 1   carvedilol (COREG) 6.25 MG tablet TAKE 1 TABLET BY MOUTH TWICE A DAY 180 tablet 2   Cholecalciferol (VITAMIN D-3 PO) Take 1 capsule by mouth daily.     cyclobenzaprine (FLEXERIL) 5 MG tablet Take 1 tablet (5 mg total) by mouth 3 (three) times daily as needed. 30 tablet 2   dorzolamide-timolol (COSOPT) 22.3-6.8 MG/ML ophthalmic solution Place 1 drop into both eyes 2 (two) times daily.     ELDERBERRY PO Take 1 capsule by mouth daily.     fenofibrate (TRICOR) 48 MG tablet TAKE 1 TABLET BY MOUTH EVERY DAY (Patient taking differently: Take 48 mg by mouth daily.) 90 tablet 2   gabapentin (NEURONTIN) 100 MG capsule Take 1 capsule (100 mg total) by mouth 2 (two) times daily. 180 capsule 3   hydrALAZINE (APRESOLINE) 10 MG tablet TAKE 1 TABLET EVERY 12 HOURS. NEEDS OV FOR MORE REFILLS 30 tablet 0   isosorbide mononitrate (IMDUR) 30 MG 24 hr tablet TAKE 1/2 OF A TABLET (15 MG TOTAL) BY MOUTH DAILY (Patient taking differently: Take 15 mg by mouth daily.) 15 tablet 0   ketoconazole (NIZORAL) 2 % cream Apply 1 application topically daily as needed for irritation. 30 g 5   nitroGLYCERIN (NITROSTAT) 0.4 MG SL tablet Place 1 tablet (0.4 mg total) under the tongue every 5 (five) minutes as needed for chest pain. Up to 3 doses 25 tablet 3    oxyCODONE-acetaminophen (PERCOCET/ROXICET) 5-325 MG tablet Take 0.5-1 tablets by mouth every 8 (eight) hours as needed for severe pain. 20 tablet 0   Polyethyl Glycol-Propyl Glycol (SYSTANE) 0.4-0.3 % GEL ophthalmic gel Place 1 application into the left eye daily as needed (Dry eyes).     rivaroxaban (XARELTO) 20 MG TABS tablet Take 1 tablet (20 mg total) by mouth daily with supper. 30 tablet 12   rosuvastatin (CRESTOR) 20 MG tablet TAKE 1 TABLET BY MOUTH EVERY DAY (Patient taking differently: Take 20 mg by mouth daily.) 30 tablet 11   traZODone (DESYREL) 100 MG tablet TAKE 2 TABLETS BY  MOUTH AT BEDTIME (Patient taking differently: Take 100-200 mg by mouth at bedtime as needed for sleep.) 180 tablet 1   No current facility-administered medications for this visit.    Allergies:   Ace inhibitors, Angiotensin receptor blockers, Vasotec [enalapril], and Pravachol [pravastatin]    Social History:  The patient  reports that he has quit smoking. His smoking use included cigars. He has never used smokeless tobacco. He reports current alcohol use. He reports that he does not use drugs.   Family History:  The patient's family history includes Aneurysm in his mother; Heart disease in his father and son; Hypertension in his father and mother; Stroke in his mother.    ROS:  Please see the history of present illness.   Otherwise, review of systems are positive for back pain.   All other systems are reviewed and negative.    PHYSICAL EXAM: VS:  BP (!) 160/100 Comment: 160/94 in the left arm  Pulse 75   Ht '5\' 9"'$  (1.753 m)   Wt 176 lb 6.4 oz (80 kg)   SpO2 92%   BMI 26.05 kg/m  , BMI Body mass index is 26.05 kg/m. 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  PVC   Recent Labs: 01/20/2022: ALT 16 07/23/2022: BUN 14; Creatinine, Ser 1.06; Hemoglobin 14.4; Platelets 202; Potassium 4.1; Sodium 140   Lipid Panel    Component Value Date/Time   CHOL 94 01/20/2022 1129   TRIG 174.0 (H) 01/20/2022 1129   HDL 35.10 (L) 01/20/2022 1129   CHOLHDL 3 01/20/2022 1129   VLDL 34.8 01/20/2022 1129   LDLCALC 24 01/20/2022 1129   LDLDIRECT 64.0 08/31/2020 0925     Other studies Reviewed: Additional studies/ records that were reviewed today with results demonstrating: Labs and ECG reviewed.   ASSESSMENT AND PLAN:  Preoperative cardiovascular exam: No further cardiac testing needed before back surgery.  The patient is already started holding his Xarelto.  He discussed this with his primary care physician.  He is at moderate risk of cardiac complication due to his age and existing coronary artery disease.  No way to lower that risk.  He did fine with back surgery last year.  Therefore, I think it is fine for him to go ahead with surgery again.  He thinks this will be a shorter surgery than he had last year.  If there are problems, we can address them at that time. Coronary artery disease: No angina on medical therapy.  Continue aggressive secondary prevention. Hyperlipidemia: LDL 24 in January 2023.  Continue rosuvastatin.   Current medicines are reviewed at length with the patient today.  The patient concerns regarding his medicines were addressed.  The following changes have been made:  No change  Labs/ tests ordered today include:  No orders of the defined types were placed in this encounter.   Recommend 150 minutes/week of aerobic exercise Low  fat, low carb, high fiber diet recommended  Disposition:   FU in 6 months with  Dr. Johnsie Cancel   Signed, Larae Grooms, MD  07/24/2022 3:43 PM    [facility] [address], [place], [place]  [address] Phone: [phone]; Fax: [phone]

## 2022-07-28 ENCOUNTER — Encounter (HOSPITAL_COMMUNITY): Payer: Self-pay | Admitting: Neurosurgery

## 2022-07-28 ENCOUNTER — Encounter (HOSPITAL_COMMUNITY)
Admission: RE | Disposition: A | Discharge status: Home / Self Care | Payer: Self-pay | Source: Home / Self Care | Attending: Neurosurgery

## 2022-07-28 ENCOUNTER — Other Ambulatory Visit: Payer: Self-pay

## 2022-07-28 ENCOUNTER — Ambulatory Visit (HOSPITAL_BASED_OUTPATIENT_CLINIC_OR_DEPARTMENT_OTHER): Payer: Medicare Other | Admitting: Anesthesiology

## 2022-07-28 ENCOUNTER — Ambulatory Visit (HOSPITAL_COMMUNITY)
Admission: RE | Admit: 2022-07-28 | Discharge: 2022-07-29 | Disposition: A | Discharge status: Home / Self Care | Payer: Medicare Other | Attending: Neurosurgery | Admitting: Neurosurgery

## 2022-07-28 ENCOUNTER — Ambulatory Visit (HOSPITAL_COMMUNITY): Payer: Medicare Other | Admitting: Vascular Surgery

## 2022-07-28 ENCOUNTER — Ambulatory Visit (HOSPITAL_COMMUNITY): Payer: Medicare Other

## 2022-07-28 DIAGNOSIS — Z79899 Other long term (current) drug therapy: Secondary | ICD-10-CM | POA: Diagnosis not present

## 2022-07-28 DIAGNOSIS — I10 Essential (primary) hypertension: Secondary | ICD-10-CM | POA: Diagnosis not present

## 2022-07-28 DIAGNOSIS — J449 Chronic obstructive pulmonary disease, unspecified: Secondary | ICD-10-CM | POA: Insufficient documentation

## 2022-07-28 DIAGNOSIS — Z87891 Personal history of nicotine dependence: Secondary | ICD-10-CM | POA: Diagnosis not present

## 2022-07-28 DIAGNOSIS — M47816 Spondylosis without myelopathy or radiculopathy, lumbar region: Secondary | ICD-10-CM | POA: Diagnosis not present

## 2022-07-28 DIAGNOSIS — Z86718 Personal history of other venous thrombosis and embolism: Secondary | ICD-10-CM | POA: Insufficient documentation

## 2022-07-28 DIAGNOSIS — Z7901 Long term (current) use of anticoagulants: Secondary | ICD-10-CM | POA: Diagnosis not present

## 2022-07-28 DIAGNOSIS — I2510 Atherosclerotic heart disease of native coronary artery without angina pectoris: Secondary | ICD-10-CM

## 2022-07-28 DIAGNOSIS — E119 Type 2 diabetes mellitus without complications: Secondary | ICD-10-CM | POA: Diagnosis not present

## 2022-07-28 DIAGNOSIS — M48061 Spinal stenosis, lumbar region without neurogenic claudication: Secondary | ICD-10-CM

## 2022-07-28 DIAGNOSIS — Z981 Arthrodesis status: Secondary | ICD-10-CM | POA: Diagnosis not present

## 2022-07-28 DIAGNOSIS — M5416 Radiculopathy, lumbar region: Secondary | ICD-10-CM | POA: Insufficient documentation

## 2022-07-28 DIAGNOSIS — F32A Depression, unspecified: Secondary | ICD-10-CM | POA: Insufficient documentation

## 2022-07-28 HISTORY — PX: LUMBAR LAMINECTOMY/DECOMPRESSION MICRODISCECTOMY: SHX5026

## 2022-07-28 LAB — GLUCOSE, CAPILLARY
Glucose-Capillary: 166 mg/dL — ABNORMAL HIGH (ref 70–99)
Glucose-Capillary: 166 mg/dL — ABNORMAL HIGH (ref 70–99)
Glucose-Capillary: 363 mg/dL — ABNORMAL HIGH (ref 70–99)

## 2022-07-28 SURGERY — LUMBAR LAMINECTOMY/DECOMPRESSION MICRODISCECTOMY 1 LEVEL
Anesthesia: General | Site: Back | Laterality: Bilateral

## 2022-07-28 MED ORDER — CHLORHEXIDINE GLUCONATE 0.12 % MT SOLN: 15.0000 mL | Freq: Once | OROMUCOSAL | Status: AC

## 2022-07-28 MED ORDER — DEXAMETHASONE SODIUM PHOSPHATE 10 MG/ML IJ SOLN
INTRAMUSCULAR | Status: DC | PRN
  Administered 2022-07-28: 5 mg via INTRAVENOUS

## 2022-07-28 MED ORDER — ACETAMINOPHEN 325 MG PO TABS: 650.0000 mg | ORAL_TABLET | ORAL | Status: DC | PRN

## 2022-07-28 MED ORDER — PROPOFOL 10 MG/ML IV BOLUS
INTRAVENOUS | Status: DC | PRN
  Administered 2022-07-28: 100 mg via INTRAVENOUS

## 2022-07-28 MED ORDER — HYDROCODONE-ACETAMINOPHEN 5-325 MG PO TABS
2.0000 | ORAL_TABLET | ORAL | Status: DC | PRN
  Administered 2022-07-28 – 2022-07-29 (×5): 2 via ORAL
  Filled 2022-07-28 (×5): qty 2

## 2022-07-28 MED ORDER — ORAL CARE MOUTH RINSE: 15.0000 mL | Freq: Once | OROMUCOSAL | Status: AC

## 2022-07-28 MED ORDER — SODIUM CHLORIDE 0.9 % IV SOLN
250.0000 mL | INTRAVENOUS | Status: DC
  Administered 2022-07-28: 250 mL via INTRAVENOUS

## 2022-07-28 MED ORDER — ACETAMINOPHEN 500 MG PO TABS: 1000.0000 mg | ORAL_TABLET | Freq: Once | ORAL | Status: DC

## 2022-07-28 MED ORDER — OXYCODONE HCL 5 MG PO TABS: 5.0000 mg | ORAL_TABLET | Freq: Once | ORAL | Status: DC | PRN

## 2022-07-28 MED ORDER — INSULIN ASPART 100 UNIT/ML IJ SOLN: 0.0000 [IU] | INTRAMUSCULAR | Status: DC | PRN

## 2022-07-28 MED ORDER — PROMETHAZINE HCL 25 MG/ML IJ SOLN: 6.2500 mg | INTRAMUSCULAR | Status: DC | PRN

## 2022-07-28 MED ORDER — ROCURONIUM BROMIDE 10 MG/ML (PF) SYRINGE
PREFILLED_SYRINGE | INTRAVENOUS | Status: AC
  Filled 2022-07-28: qty 10

## 2022-07-28 MED ORDER — LIDOCAINE-EPINEPHRINE 1 %-1:100000 IJ SOLN
INTRAMUSCULAR | Status: DC | PRN
  Administered 2022-07-28: 10 mL

## 2022-07-28 MED ORDER — PHENOL 1.4 % MT LIQD: 1.0000 | OROMUCOSAL | Status: DC | PRN

## 2022-07-28 MED ORDER — KETOCONAZOLE 2 % EX CREA: 1.0000 | TOPICAL_CREAM | Freq: Every day | CUTANEOUS | Status: DC | PRN

## 2022-07-28 MED ORDER — CEFAZOLIN SODIUM-DEXTROSE 2-4 GM/100ML-% IV SOLN
2.0000 g | INTRAVENOUS | Status: AC
  Administered 2022-07-28: 2 g via INTRAVENOUS

## 2022-07-28 MED ORDER — INSULIN ASPART 100 UNIT/ML IJ SOLN
0.0000 [IU] | Freq: Every day | INTRAMUSCULAR | Status: DC
  Administered 2022-07-28: 5 [IU] via SUBCUTANEOUS

## 2022-07-28 MED ORDER — MIDAZOLAM HCL 2 MG/2ML IJ SOLN: 0.5000 mg | Freq: Once | INTRAMUSCULAR | Status: DC | PRN

## 2022-07-28 MED ORDER — LIDOCAINE 2% (20 MG/ML) 5 ML SYRINGE
INTRAMUSCULAR | Status: AC
  Filled 2022-07-28: qty 5

## 2022-07-28 MED ORDER — ISOSORBIDE MONONITRATE ER 30 MG PO TB24
15.0000 mg | ORAL_TABLET | Freq: Every day | ORAL | Status: DC
  Administered 2022-07-29: 15 mg via ORAL
  Filled 2022-07-28: qty 1

## 2022-07-28 MED ORDER — ALBUTEROL SULFATE (2.5 MG/3ML) 0.083% IN NEBU: 2.5000 mg | INHALATION_SOLUTION | Freq: Four times a day (QID) | RESPIRATORY_TRACT | Status: DC | PRN

## 2022-07-28 MED ORDER — TRAZODONE HCL 150 MG PO TABS: 200.0000 mg | ORAL_TABLET | Freq: Every evening | ORAL | Status: DC | PRN

## 2022-07-28 MED ORDER — EPHEDRINE SULFATE-NACL 50-0.9 MG/10ML-% IV SOSY
PREFILLED_SYRINGE | INTRAVENOUS | Status: DC | PRN
  Administered 2022-07-28: 5 mg via INTRAVENOUS

## 2022-07-28 MED ORDER — GLYCOPYRROLATE PF 0.2 MG/ML IJ SOSY
PREFILLED_SYRINGE | INTRAMUSCULAR | Status: AC
  Filled 2022-07-28: qty 1

## 2022-07-28 MED ORDER — ONDANSETRON HCL 4 MG/2ML IJ SOLN: 4.0000 mg | Freq: Four times a day (QID) | INTRAMUSCULAR | Status: DC | PRN

## 2022-07-28 MED ORDER — 0.9 % SODIUM CHLORIDE (POUR BTL) OPTIME
TOPICAL | Status: DC | PRN
  Administered 2022-07-28: 1000 mL

## 2022-07-28 MED ORDER — THROMBIN 5000 UNITS EX SOLR
CUTANEOUS | Status: AC
  Filled 2022-07-28: qty 10000

## 2022-07-28 MED ORDER — SUGAMMADEX SODIUM 200 MG/2ML IV SOLN
INTRAVENOUS | Status: DC | PRN
  Administered 2022-07-28: 200 mg via INTRAVENOUS

## 2022-07-28 MED ORDER — MENTHOL 3 MG MT LOZG: 1.0000 | LOZENGE | OROMUCOSAL | Status: DC | PRN

## 2022-07-28 MED ORDER — LIDOCAINE-EPINEPHRINE 1 %-1:100000 IJ SOLN
INTRAMUSCULAR | Status: AC
  Filled 2022-07-28: qty 1

## 2022-07-28 MED ORDER — PROPOFOL 10 MG/ML IV BOLUS
INTRAVENOUS | Status: AC
  Filled 2022-07-28: qty 20

## 2022-07-28 MED ORDER — HYDROMORPHONE HCL 1 MG/ML IJ SOLN: 0.5000 mg | INTRAMUSCULAR | Status: DC | PRN

## 2022-07-28 MED ORDER — ROSUVASTATIN CALCIUM 20 MG PO TABS
20.0000 mg | ORAL_TABLET | Freq: Every day | ORAL | Status: DC
  Administered 2022-07-29: 20 mg via ORAL
  Filled 2022-07-28: qty 1

## 2022-07-28 MED ORDER — FENOFIBRATE 54 MG PO TABS
54.0000 mg | ORAL_TABLET | Freq: Every day | ORAL | Status: DC
  Administered 2022-07-29: 54 mg via ORAL
  Filled 2022-07-28: qty 1

## 2022-07-28 MED ORDER — B COMPLEX-C PO TABS
1.0000 | ORAL_TABLET | Freq: Every day | ORAL | Status: DC
  Administered 2022-07-29: 1 via ORAL
  Filled 2022-07-28 (×2): qty 1

## 2022-07-28 MED ORDER — CHLORHEXIDINE GLUCONATE CLOTH 2 % EX PADS: 6.0000 | MEDICATED_PAD | Freq: Once | CUTANEOUS | Status: DC

## 2022-07-28 MED ORDER — LACTATED RINGERS IV SOLN: INTRAVENOUS | Status: DC

## 2022-07-28 MED ORDER — NITROGLYCERIN 0.4 MG SL SUBL: 0.4000 mg | SUBLINGUAL_TABLET | SUBLINGUAL | Status: DC | PRN

## 2022-07-28 MED ORDER — HYDROMORPHONE HCL 1 MG/ML IJ SOLN: 0.2500 mg | INTRAMUSCULAR | Status: DC | PRN

## 2022-07-28 MED ORDER — BUPIVACAINE HCL (PF) 0.25 % IJ SOLN
INTRAMUSCULAR | Status: AC
Start: 2022-07-28 — End: ?
  Filled 2022-07-28: qty 30

## 2022-07-28 MED ORDER — ONDANSETRON HCL 4 MG/2ML IJ SOLN
INTRAMUSCULAR | Status: AC
  Filled 2022-07-28: qty 2

## 2022-07-28 MED ORDER — PHENYLEPHRINE HCL-NACL 20-0.9 MG/250ML-% IV SOLN
INTRAVENOUS | Status: DC | PRN
  Administered 2022-07-28: 50 ug/min via INTRAVENOUS

## 2022-07-28 MED ORDER — GABAPENTIN 100 MG PO CAPS: 100.0000 mg | ORAL_CAPSULE | Freq: Two times a day (BID) | ORAL | Status: DC | PRN

## 2022-07-28 MED ORDER — ELDERBERRY 575 MG/5ML PO SYRP: ORAL_SOLUTION | Freq: Every day | ORAL | Status: DC

## 2022-07-28 MED ORDER — INSULIN ASPART 100 UNIT/ML IJ SOLN
0.0000 [IU] | Freq: Three times a day (TID) | INTRAMUSCULAR | Status: DC
  Administered 2022-07-29: 2 [IU] via SUBCUTANEOUS

## 2022-07-28 MED ORDER — SODIUM CHLORIDE 0.9% FLUSH: 3.0000 mL | INTRAVENOUS | Status: DC | PRN

## 2022-07-28 MED ORDER — RIVAROXABAN 20 MG PO TABS
20.0000 mg | ORAL_TABLET | Freq: Every day | ORAL | Status: DC
  Filled 2022-07-28: qty 1

## 2022-07-28 MED ORDER — ACETAMINOPHEN 650 MG RE SUPP: 650.0000 mg | RECTAL | Status: DC | PRN

## 2022-07-28 MED ORDER — ROCURONIUM 10MG/ML (10ML) SYRINGE FOR MEDFUSION PUMP - OPTIME
INTRAVENOUS | Status: DC | PRN
  Administered 2022-07-28: 100 mg via INTRAVENOUS

## 2022-07-28 MED ORDER — HYDRALAZINE HCL 10 MG PO TABS
10.0000 mg | ORAL_TABLET | Freq: Two times a day (BID) | ORAL | Status: DC
  Administered 2022-07-28 – 2022-07-29 (×2): 10 mg via ORAL
  Filled 2022-07-28 (×2): qty 1

## 2022-07-28 MED ORDER — VITAMIN D 25 MCG (1000 UNIT) PO TABS: 5000.0000 [IU] | ORAL_TABLET | Freq: Every day | ORAL | Status: DC

## 2022-07-28 MED ORDER — PHENYLEPHRINE HCL (PRESSORS) 10 MG/ML IV SOLN
INTRAVENOUS | Status: DC | PRN
  Administered 2022-07-28: 80 ug via INTRAVENOUS
  Administered 2022-07-28: 160 ug via INTRAVENOUS

## 2022-07-28 MED ORDER — CEFAZOLIN SODIUM-DEXTROSE 2-4 GM/100ML-% IV SOLN
INTRAVENOUS | Status: AC
  Filled 2022-07-28: qty 100

## 2022-07-28 MED ORDER — DEXAMETHASONE SODIUM PHOSPHATE 10 MG/ML IJ SOLN
INTRAMUSCULAR | Status: AC
  Filled 2022-07-28: qty 1

## 2022-07-28 MED ORDER — CYCLOBENZAPRINE HCL 5 MG PO TABS: 5.0000 mg | ORAL_TABLET | Freq: Three times a day (TID) | ORAL | Status: DC | PRN

## 2022-07-28 MED ORDER — CARVEDILOL 6.25 MG PO TABS
6.2500 mg | ORAL_TABLET | Freq: Two times a day (BID) | ORAL | Status: DC
  Administered 2022-07-28 – 2022-07-29 (×2): 6.25 mg via ORAL
  Filled 2022-07-28 (×2): qty 1

## 2022-07-28 MED ORDER — SUFENTANIL CITRATE 50 MCG/ML IV SOLN
INTRAVENOUS | Status: DC | PRN
  Administered 2022-07-28: 20 ug via INTRAVENOUS

## 2022-07-28 MED ORDER — BUPIVACAINE HCL (PF) 0.25 % IJ SOLN
INTRAMUSCULAR | Status: DC | PRN
  Administered 2022-07-28: 10 mL

## 2022-07-28 MED ORDER — OXYCODONE HCL 5 MG/5ML PO SOLN: 5.0000 mg | Freq: Once | ORAL | Status: DC | PRN

## 2022-07-28 MED ORDER — ALUM & MAG HYDROXIDE-SIMETH 200-200-20 MG/5ML PO SUSP
30.0000 mL | Freq: Four times a day (QID) | ORAL | Status: DC | PRN
  Filled 2022-07-28: qty 30

## 2022-07-28 MED ORDER — CHLORHEXIDINE GLUCONATE 0.12 % MT SOLN
OROMUCOSAL | Status: AC
  Administered 2022-07-28: 15 mL via OROMUCOSAL
  Filled 2022-07-28: qty 15

## 2022-07-28 MED ORDER — ASCORBIC ACID 500 MG PO TABS
500.0000 mg | ORAL_TABLET | Freq: Every day | ORAL | Status: DC
  Administered 2022-07-28 – 2022-07-29 (×2): 500 mg via ORAL
  Filled 2022-07-28 (×2): qty 1

## 2022-07-28 MED ORDER — LIDOCAINE 2% (20 MG/ML) 5 ML SYRINGE
INTRAMUSCULAR | Status: DC | PRN
  Administered 2022-07-28: 60 mg via INTRAVENOUS

## 2022-07-28 MED ORDER — ACETAMINOPHEN 325 MG PO TABS: 650.0000 mg | ORAL_TABLET | Freq: Two times a day (BID) | ORAL | Status: DC | PRN

## 2022-07-28 MED ORDER — SODIUM CHLORIDE 0.9% FLUSH
3.0000 mL | Freq: Two times a day (BID) | INTRAVENOUS | Status: DC
Start: 2022-07-28 — End: 2022-07-29
  Administered 2022-07-28: 3 mL via INTRAVENOUS

## 2022-07-28 MED ORDER — SUFENTANIL CITRATE 50 MCG/ML IV SOLN
INTRAVENOUS | Status: AC
  Filled 2022-07-28: qty 1

## 2022-07-28 MED ORDER — BUPROPION HCL ER (SR) 150 MG PO TB12
150.0000 mg | ORAL_TABLET | Freq: Every day | ORAL | Status: DC
  Filled 2022-07-28 (×3): qty 1

## 2022-07-28 MED ORDER — PANTOPRAZOLE SODIUM 40 MG IV SOLR: 40.0000 mg | Freq: Every day | INTRAVENOUS | Status: DC

## 2022-07-28 MED ORDER — DORZOLAMIDE HCL-TIMOLOL MAL 2-0.5 % OP SOLN
1.0000 [drp] | Freq: Two times a day (BID) | OPHTHALMIC | Status: DC
  Administered 2022-07-29: 1 [drp] via OPHTHALMIC
  Filled 2022-07-28: qty 10

## 2022-07-28 MED ORDER — OXYCODONE-ACETAMINOPHEN 5-325 MG PO TABS: 1.0000 | ORAL_TABLET | Freq: Three times a day (TID) | ORAL | Status: DC | PRN

## 2022-07-28 MED ORDER — PANTOPRAZOLE SODIUM 40 MG PO TBEC
40.0000 mg | DELAYED_RELEASE_TABLET | Freq: Every day | ORAL | Status: DC
  Administered 2022-07-28 – 2022-07-29 (×2): 40 mg via ORAL
  Filled 2022-07-28 (×2): qty 1

## 2022-07-28 MED ORDER — ONDANSETRON HCL 4 MG/2ML IJ SOLN
INTRAMUSCULAR | Status: DC | PRN
  Administered 2022-07-28: 4 mg via INTRAVENOUS

## 2022-07-28 MED ORDER — ONDANSETRON HCL 4 MG PO TABS: 4.0000 mg | ORAL_TABLET | Freq: Four times a day (QID) | ORAL | Status: DC | PRN

## 2022-07-28 MED ORDER — THROMBIN 5000 UNITS EX SOLR
CUTANEOUS | Status: DC | PRN
  Administered 2022-07-28 (×2): 5000 [IU] via TOPICAL

## 2022-07-28 MED ORDER — ASPIRIN 81 MG PO TBEC
81.0000 mg | DELAYED_RELEASE_TABLET | Freq: Every day | ORAL | Status: DC
  Administered 2022-07-29: 81 mg via ORAL
  Filled 2022-07-28: qty 1

## 2022-07-28 MED ORDER — CYCLOBENZAPRINE HCL 10 MG PO TABS
10.0000 mg | ORAL_TABLET | Freq: Three times a day (TID) | ORAL | Status: DC | PRN
  Administered 2022-07-28: 10 mg via ORAL
  Filled 2022-07-28: qty 1

## 2022-07-28 MED ORDER — CEFAZOLIN SODIUM-DEXTROSE 2-4 GM/100ML-% IV SOLN
2.0000 g | Freq: Three times a day (TID) | INTRAVENOUS | Status: AC
  Administered 2022-07-28 – 2022-07-29 (×2): 2 g via INTRAVENOUS
  Filled 2022-07-28 (×2): qty 100

## 2022-07-28 SURGICAL SUPPLY — 55 items
ADH SKN CLS APL DERMABOND .7 (GAUZE/BANDAGES/DRESSINGS) ×1
APL SKNCLS STERI-STRIP NONHPOA (GAUZE/BANDAGES/DRESSINGS) ×1
BAG COUNTER SPONGE SURGICOUNT (BAG) ×3 IMPLANT
BAG SPNG CNTER NS LX DISP (BAG) ×1
BAND INSRT 18 STRL LF DISP RB (MISCELLANEOUS) ×2
BAND RUBBER #18 3X1/16 STRL (MISCELLANEOUS) ×6 IMPLANT
BENZOIN TINCTURE PRP APPL 2/3 (GAUZE/BANDAGES/DRESSINGS) ×3 IMPLANT
BLADE CLIPPER SURG (BLADE) IMPLANT
BLADE SURG 11 STRL SS (BLADE) ×3 IMPLANT
BUR CUTTER 7.0 ROUND (BURR) ×3 IMPLANT
BUR MATCHSTICK NEURO 3.0 LAGG (BURR) ×3 IMPLANT
CANISTER SUCT 3000ML PPV (MISCELLANEOUS) ×3 IMPLANT
CARTRIDGE OIL MAESTRO DRILL (MISCELLANEOUS) ×2 IMPLANT
DERMABOND ADVANCED (GAUZE/BANDAGES/DRESSINGS) ×1
DERMABOND ADVANCED .7 DNX12 (GAUZE/BANDAGES/DRESSINGS) ×2 IMPLANT
DIFFUSER DRILL AIR PNEUMATIC (MISCELLANEOUS) ×3 IMPLANT
DRAPE HALF SHEET 40X57 (DRAPES) IMPLANT
DRAPE LAPAROTOMY 100X72X124 (DRAPES) ×3 IMPLANT
DRAPE MICROSCOPE LEICA (MISCELLANEOUS) ×3 IMPLANT
DRAPE SURG 17X23 STRL (DRAPES) ×3 IMPLANT
DRSG OPSITE POSTOP 4X6 (GAUZE/BANDAGES/DRESSINGS) ×1 IMPLANT
DURAPREP 26ML APPLICATOR (WOUND CARE) ×3 IMPLANT
ELECT REM PT RETURN 9FT ADLT (ELECTROSURGICAL) ×2
ELECTRODE REM PT RTRN 9FT ADLT (ELECTROSURGICAL) ×2 IMPLANT
GAUZE 4X4 16PLY ~~LOC~~+RFID DBL (SPONGE) IMPLANT
GAUZE SPONGE 4X4 12PLY STRL (GAUZE/BANDAGES/DRESSINGS) ×3 IMPLANT
GLOVE BIO SURGEON STRL SZ7 (GLOVE) IMPLANT
GLOVE BIO SURGEON STRL SZ8 (GLOVE) ×4 IMPLANT
GLOVE BIOGEL PI IND STRL 7.0 (GLOVE) IMPLANT
GLOVE BIOGEL PI INDICATOR 7.0 (GLOVE) ×2
GLOVE EXAM NITRILE XL STR (GLOVE) IMPLANT
GLOVE INDICATOR 8.5 STRL (GLOVE) ×6 IMPLANT
GOWN STRL REUS W/ TWL LRG LVL3 (GOWN DISPOSABLE) ×2 IMPLANT
GOWN STRL REUS W/ TWL XL LVL3 (GOWN DISPOSABLE) ×4 IMPLANT
GOWN STRL REUS W/TWL 2XL LVL3 (GOWN DISPOSABLE) IMPLANT
GOWN STRL REUS W/TWL LRG LVL3 (GOWN DISPOSABLE) ×2
GOWN STRL REUS W/TWL XL LVL3 (GOWN DISPOSABLE) ×4
KIT BASIN OR (CUSTOM PROCEDURE TRAY) ×3 IMPLANT
KIT TURNOVER KIT B (KITS) ×3 IMPLANT
NDL SPNL 22GX3.5 QUINCKE BK (NEEDLE) ×2 IMPLANT
NEEDLE HYPO 22GX1.5 SAFETY (NEEDLE) ×3 IMPLANT
NEEDLE SPNL 22GX3.5 QUINCKE BK (NEEDLE) ×2 IMPLANT
NS IRRIG 1000ML POUR BTL (IV SOLUTION) ×3 IMPLANT
OIL CARTRIDGE MAESTRO DRILL (MISCELLANEOUS) ×2
PACK LAMINECTOMY NEURO (CUSTOM PROCEDURE TRAY) ×3 IMPLANT
SPIKE FLUID TRANSFER (MISCELLANEOUS) ×2 IMPLANT
SPONGE SURGIFOAM ABS GEL SZ50 (HEMOSTASIS) ×3 IMPLANT
STRIP CLOSURE SKIN 1/2X4 (GAUZE/BANDAGES/DRESSINGS) ×3 IMPLANT
SUT VIC AB 0 CT1 18XCR BRD8 (SUTURE) ×2 IMPLANT
SUT VIC AB 0 CT1 8-18 (SUTURE) ×2
SUT VIC AB 2-0 CT1 18 (SUTURE) ×3 IMPLANT
SUT VICRYL 4-0 PS2 18IN ABS (SUTURE) ×3 IMPLANT
TOWEL GREEN STERILE (TOWEL DISPOSABLE) ×3 IMPLANT
TOWEL GREEN STERILE FF (TOWEL DISPOSABLE) ×3 IMPLANT
WATER STERILE IRR 1000ML POUR (IV SOLUTION) ×3 IMPLANT

## 2022-07-28 NOTE — Progress Notes (Signed)
PHARMACIST - PHYSICIAN ORDER COMMUNICATION  CONCERNING: P&T Medication Policy on Herbal Medications  DESCRIPTION:  This patient's order for:  Elderberry syrup has been noted.  This product(s) is classified as an "herbal" or natural product. Due to a lack of definitive safety studies or FDA approval, nonstandard manufacturing practices, plus the potential risk of unknown drug-drug interactions while on inpatient medications, the Pharmacy and Therapeutics Committee does not permit the use of "herbal" or natural products of this type within [facility].   ACTION TAKEN: The pharmacy department is unable to verify this order at this time and your patient has been informed of this safety policy. Please reevaluate patient's clinical condition at discharge and address if the herbal or natural product(s) should be resumed at that time.   Sherlon Handing, PharmD, BCPS Please see amion for complete clinical pharmacist phone list 07/28/2022 3:03 PM

## 2022-07-28 NOTE — Anesthesia Procedure Notes (Signed)
Procedure Name: Intubation Date/Time: 07/28/2022 11:18 AM  Performed by: Claris Che, CRNAPre-anesthesia Checklist: Patient identified, Emergency Drugs available, Suction available, Patient being monitored and Timeout performed Patient Re-evaluated:Patient Re-evaluated prior to induction Oxygen Delivery Method: Circle system utilized Preoxygenation: Pre-oxygenation with 100% oxygen Induction Type: IV induction Ventilation: Oral airway inserted - appropriate to patient size Laryngoscope Size: Mac and 4 Grade View: Grade I Tube type: Oral Tube size: 8.0 mm Number of attempts: 1 Airway Equipment and Method: Stylet Placement Confirmation: ETT inserted through vocal cords under direct vision, positive ETCO2 and breath sounds checked- equal and bilateral Secured at: 22 cm Tube secured with: Tape Dental Injury: Teeth and Oropharynx as per pre-operative assessment

## 2022-07-28 NOTE — Anesthesia Postprocedure Evaluation (Addendum)
Anesthesia Post Note  Patient: CONER GIBBARD  Procedure(s) Performed: Sublaminar decompression - Lumbar two-Lumbar three - bilateral redo (Bilateral: Back)     Patient location during evaluation: PACU Anesthesia Type: General Level of consciousness: awake and alert, patient cooperative and oriented Pain management: pain level controlled Vital Signs Assessment: post-procedure vital signs reviewed and stable Respiratory status: spontaneous breathing, nonlabored ventilation, respiratory function stable and patient connected to nasal cannula oxygen Cardiovascular status: blood pressure returned to baseline and stable Postop Assessment: no apparent nausea or vomiting Anesthetic complications: no   No notable events documented.  Last Vitals:  Vitals:   07/28/22 1415 07/28/22 1453  BP: (!) 156/98 (!) 165/97  Pulse: 78 87  Resp: 14 18  Temp: 36.6 C 36.5 C  SpO2: 95% 93%    Last Pain:  Vitals:   07/28/22 1415  TempSrc:   PainSc: 0-No pain                 Sherrilyn Nairn,E. Denica Web

## 2022-07-28 NOTE — H&P (Signed)
Nathan Fernandez is an 81 y.o. male.   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  Due to his progression of clinical syndrome imaging findings failed conservative treatment I recommended extension of his decompression to include L2-3 and the L2 foramina.  We extensively went over the risks and benefits of that procedure with him as well as perioperative course expectations of outcome and alternatives of surgery and he understood and agreed to proceed forward.  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   Hosp R facial weakness   CAD (coronary artery disease)    MI, [place], PTCA; [place] 04/18/13: Distal left main 40%, proximal LAD with several aneurysmal segments, proximal LAD 50% prior to and after aneurysmal segments, area does not appear to flow-limiting, proximal diagonal 70%, ostial circumflex 20%, proximal circumflex 20%, OM1 30-40%, proximal RCA 40%, mid RCA 30%, distal RCA 20%, EF 50% => med Rx   Cancer ([place])    skin cancer - 2020   COPD (chronic obstructive pulmonary disease) (HCC)    DM2 (diabetes mellitus, type 2) ([place])    DVT (deep venous thrombosis) ([place]) 12/2021   History of ETT 1998   wnl   History of hiatal hernia    History of MRI 08/04/2009   brain- atrophy sm vess dz   HLD (hyperlipidemia)    HTN (hypertension)    Myocardial infarction (HCC)    OA (osteoarthritis)    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[place])    05/09/12 [place]: 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, large collateral proximal to aneurysm   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 0/81/44    Past Surgical History:  Procedure Laterality Date   BUBBLE STUDY  05/15/2020   Procedure: BUBBLE STUDY;  Surgeon: Nathan Casino,  MD;  Location: [place];  Service: Cardiovascular;;   CARDIAC CATHETERIZATION     cataract surgery  08/2005   repair lens which moved   [place] CATH AND CORONARY ANGIOGRAPHY N/A 01/21/2019   Procedure: LEFT HEART CATH AND CORONARY ANGIOGRAPHY;  Surgeon: Nathan Blanks, MD;  Location: [place] [facility];  Service: Cardiovascular;  Laterality: N/A;   LEFT HEART CATH AND CORONARY ANGIOGRAPHY N/A 02/13/2021   Procedure: LEFT HEART CATH AND CORONARY ANGIOGRAPHY;  Surgeon: Nathan Hampshire, MD;  Location: [place] [facility];  Service: Cardiovascular;  Laterality: N/A;   LOOP RECORDER INSERTION N/A 05/15/2020   Procedure: LOOP RECORDER INSERTION;  Surgeon: Nathan Grayer, MD;  Location: [place] [facility];  Service: Cardiovascular;  Laterality: N/A;   LUMBAR LAMINECTOMY/DECOMPRESSION MICRODISCECTOMY N/A 06/19/2021   Procedure: Lumbar Two-Three, Lumbar Three-Four, Lumbar Four-Five Laminectomy and Foraminotomy;  Surgeon: Nathan Kos, MD;  Location: [place];  Service: Neurosurgery;  Laterality: N/A;   stress myoview  06/29/2006   sm distal anteroseptal & apical infarct   TEE WITHOUT CARDIOVERSION N/A 05/15/2020   Procedure: TRANSESOPHAGEAL ECHOCARDIOGRAM (TEE);  Surgeon: Nathan Casino, MD;  Location: [facility];  Service: Cardiovascular;  Laterality: N/A;   THYROIDECTOMY, PARTIAL  1967   B9 growth   TONSILLECTOMY      Family History  Problem Relation Age of Onset   Hypertension Mother    Aneurysm Mother    Stroke Mother  Hypertension Father    Heart disease Father        CAD   Heart disease Son        MI   Colon cancer Neg Hx    Prostate cancer Neg Hx    Social History:  reports that he has quit smoking. His smoking use included cigars. He has never used smokeless tobacco. He reports current alcohol use. He reports that he does not use drugs.  Allergies:  Allergies  Allergen Reactions   Ace Inhibitors Swelling    Swelling of the tongue   Angiotensin  Receptor Blockers Swelling    Tongue swelling   Vasotec [Enalapril] Swelling    Tongue swelling   Pravachol [Pravastatin] Other (See Comments)    Myalgias     Medications Prior to Admission  Medication Sig Dispense Refill   acetaminophen (TYLENOL) 325 MG tablet Take 1.5 tablets (487.5 mg total) by mouth 2 (two) times daily. (Patient taking differently: Take 650 mg by mouth 2 (two) times daily as needed for mild pain or headache.)     albuterol (VENTOLIN HFA) 108 (90 Base) MCG/ACT inhaler Inhale 1-2 puffs into the lungs every 6 (six) hours as needed (COPD). (Patient taking differently: Inhale 2 puffs into the lungs every 6 (six) hours as needed (COPD).) 1 each 3   Ascorbic Acid (VITAMIN C PO) Take 1 tablet by mouth daily.     aspirin 81 MG EC tablet Take 81 mg by mouth daily.     B Complex CAPS Take 1 capsule by mouth daily.     buPROPion (WELLBUTRIN SR) 150 MG 12 hr tablet TAKE 1 TABLET BY MOUTH EVERYDAY AT BEDTIME (Patient taking differently: Take 150 mg by mouth at bedtime.) 90 tablet 1   carvedilol (COREG) 6.25 MG tablet TAKE 1 TABLET BY MOUTH TWICE A DAY 180 tablet 2   Cholecalciferol (VITAMIN D-3 PO) Take 1 capsule by mouth daily.     cyclobenzaprine (FLEXERIL) 5 MG tablet Take 1 tablet (5 mg total) by mouth 3 (three) times daily as needed. 30 tablet 2   dorzolamide-timolol (COSOPT) 22.3-6.8 MG/ML ophthalmic solution Place 1 drop into both eyes 2 (two) times daily.     ELDERBERRY PO Take 1 capsule by mouth daily.     fenofibrate (TRICOR) 48 MG tablet TAKE 1 TABLET BY MOUTH EVERY DAY (Patient taking differently: Take 48 mg by mouth daily.) 90 tablet 2   gabapentin (NEURONTIN) 100 MG capsule Take 1 capsule (100 mg total) by mouth 2 (two) times daily. (Patient taking differently: Take 100 mg by mouth 2 (two) times daily as needed (nerve pain).) 180 capsule 3   hydrALAZINE (APRESOLINE) 10 MG tablet TAKE 1 TABLET EVERY 12 HOURS. NEEDS OV FOR MORE REFILLS (Patient taking differently: Take 10 mg  by mouth every 12 (twelve) hours.) 30 tablet 0   isosorbide mononitrate (IMDUR) 30 MG 24 hr tablet TAKE 1/2 OF A TABLET (15 MG TOTAL) BY MOUTH DAILY (Patient taking differently: Take 15 mg by mouth daily.) 15 tablet 0   ketoconazole (NIZORAL) 2 % cream Apply 1 application topically daily as needed for irritation. 30 g 5   nitroGLYCERIN (NITROSTAT) 0.4 MG SL tablet Place 1 tablet (0.4 mg total) under the tongue every 5 (five) minutes as needed for chest pain. Up to 3 doses 25 tablet 3   oxyCODONE-acetaminophen (PERCOCET/ROXICET) 5-325 MG tablet Take 0.5-1 tablets by mouth every 8 (eight) hours as needed for severe pain. (Patient taking differently: Take 1 tablet by mouth every  8 (eight) hours as needed for severe pain.) 20 tablet 0   rosuvastatin (CRESTOR) 20 MG tablet TAKE 1 TABLET BY MOUTH EVERY DAY (Patient taking differently: Take 20 mg by mouth daily.) 30 tablet 11   traZODone (DESYREL) 100 MG tablet TAKE 2 TABLETS BY MOUTH AT BEDTIME (Patient taking differently: Take 200 mg by mouth at bedtime as needed for sleep.) 180 tablet 1   rivaroxaban (XARELTO) 20 MG TABS tablet Take 1 tablet (20 mg total) by mouth daily with supper. (Patient not taking: Reported on 07/28/2022) 30 tablet 12    Results for orders placed or performed during the hospital encounter of 07/28/22 (from the past 48 hour(s))  Glucose, capillary     Status: Abnormal   Collection Time: 07/28/22  9:02 AM  Result Value Ref Range   Glucose-Capillary 166 (H) 70 - 99 mg/dL    Comment: Glucose reference range applies only to samples taken after fasting for at least 8 hours.   No results found.  Review of Systems  Musculoskeletal:  Positive for back pain.  Neurological:  Positive for numbness.    Blood pressure (!) 162/99, pulse 85, temperature 97.6 F (36.4 C), temperature source Oral, resp. rate 18, height '5\' 9"'$  (1.753 m), weight 79.4 kg, SpO2 93 %. Physical Exam HENT:     Head: Normocephalic.     Right Ear: Tympanic membrane  normal.     Nose: Nose normal.     Mouth/Throat:     Mouth: Mucous membranes are moist.  Eyes:     Pupils: Pupils are equal, round, and reactive to light.  Cardiovascular:     Rate and Rhythm: Normal rate.  Pulmonary:     Effort: Pulmonary effort is normal.  Abdominal:     General: Abdomen is flat.  Musculoskeletal:        General: Normal range of motion.     Cervical back: Normal range of motion.  Neurological:     Mental Status: He is alert.     Comments: Strength is 5 out of 5 iliopsoas, quads, hamstrings, gastrocs, tibialis, and EHL.      Assessment/Plan 81 year old presents for extension decompressive laminectomy with redo 2 3 decompression  Elaina Hoops, MD 07/28/2022, 10:24 AM

## 2022-07-28 NOTE — Op Note (Signed)
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  Postoperative diagnosis: Same  Procedure: Redo decompressive laminectomy L2-3 with foraminotomies of the L2 nerve root and redo foraminotomies the L3 nerve root partial medial facetectomies  Surgeon: Dominica Severin Tyren Dugar  Assistant: Nash Shearer  Anesthesia: General  EBL: Minimal  HPI: 81 year old gentleman previously undergone L3-L5 decompression and did very well for the first several months and then over the last few months has had progressive worsening back and bilateral leg pain workup revealed progressive spinal stenosis above his previous decompression at L2-3 compressing the L2 nerve root as well as restenosis in the L3 foramen.  Due to patient's progression of clinical syndrome imaging findings failed conservative treatment I recommended extension of his decompression to include L2.  I extensively went over the risks and benefits of that operation with him as well as perioperative course expectations of outcome and alternatives of surgery and he understood and agreed to proceed forward.  Operative procedure: Patient was brought into the OR was induced under general anesthesia positioned prone on the Wilson frame his back was prepped and draped in routine sterile fashion.  His old incision was opened up and extended slightly cephalad subperiosteal dissection was carried out through the scar tissue of the lamina for 3 and facet joint at L2-3.  Scar tissue was dissected off of the superior aspect of the previous L3 decompression the facet joint at 2 3.  I then remove the spinous process at L2 drilled down the lamina complex identified ligamentum flavum and then started the central decompression.  I marched up through the L2 lamina to the L1-2 disc base and marching down the gutter I performed foraminotomies of the L2 nerve roots bilaterally worked down to the scar tissue and freed up the scar due to his large spurs  coming off the facet joint causing severe hourglass compression of thecal sac at the level of the disc base so this was all teased off of the disc base and removed in piecemeal fashion the scar tissue was freed up I then unroofed the L3 foramen continue the foraminotomies down to the aspect of the inferior aspect of the L3 pedicle bilaterally.  I did perform an intraoperative x-ray to confirm the superior and inferior aspect of decompression.  Then after adequate decompression which even foraminotomies to be done at L2 and L3 bilaterally the wound was then copiously irrigated meticulous hemostasis was maintained Gelfoam was ON top of the dura the muscle fascia reapproximated layers with interrupted Vicryl and the skin was closed running 4 subcuticular.  Dermabond benzoin Steri-Strips and sterile dressing was applied patient went recovery in stable condition.  At the end the case all needle count sponge counts were correct.

## 2022-07-28 NOTE — Transfer of Care (Signed)
Immediate Anesthesia Transfer of Care Note  Patient: JAQUARI RECKNER  Procedure(s) Performed: Sublaminar decompression - Lumbar two-Lumbar three - bilateral redo (Bilateral: Back)  Patient Location: PACU  Anesthesia Type:General  Level of Consciousness: alert , drowsy, patient cooperative and responds to stimulation  Airway & Oxygen Therapy: Patient Spontanous Breathing and Patient connected to face mask oxygen  Post-op Assessment: Report given to RN, Post -op Vital signs reviewed and stable and Patient moving all extremities X 4  Post vital signs: Reviewed and stable  Last Vitals:  Vitals Value Taken Time  BP 165/97 07/28/22 1310  Temp    Pulse 84 07/28/22 1312  Resp 13 07/28/22 1312  SpO2 93 % 07/28/22 1312  Vitals shown include unvalidated device data.  Last Pain:  Vitals:   07/28/22 0904  TempSrc:   PainSc: 4          Complications: No notable events documented.

## 2022-07-28 NOTE — Anesthesia Procedure Notes (Signed)
Arterial Line Insertion Start/End8/06/2022 10:40 AM, 07/28/2022 10:46 AM Performed by: Annye Asa, MD, Imagene Riches, CRNA, anesthesiologist  Patient location: Pre-op. Preanesthetic checklist: patient identified, IV checked, risks and benefits discussed, surgical consent, monitors and equipment checked, pre-op evaluation, timeout performed and anesthesia consent Lidocaine 1% used for infiltration Left, radial was placed Catheter size: 20 G Hand hygiene performed , maximum sterile barriers used  and Seldinger technique used Allen's test indicative of satisfactory collateral circulation Attempts: 1 Procedure performed using ultrasound guided technique. Ultrasound Notes:anatomy identified, needle tip was noted to be adjacent to the nerve/plexus identified, no ultrasound evidence of intravascular and/or intraneural injection and image(s) printed for medical record Following insertion, dressing applied and Biopatch. Post procedure assessment: normal  Post procedure complications: second provider assisted. Patient tolerated the procedure well with no immediate complications.

## 2022-07-29 ENCOUNTER — Encounter (HOSPITAL_COMMUNITY): Payer: Self-pay | Admitting: Neurosurgery

## 2022-07-29 DIAGNOSIS — Z87891 Personal history of nicotine dependence: Secondary | ICD-10-CM | POA: Diagnosis not present

## 2022-07-29 DIAGNOSIS — M5416 Radiculopathy, lumbar region: Secondary | ICD-10-CM | POA: Diagnosis not present

## 2022-07-29 DIAGNOSIS — F32A Depression, unspecified: Secondary | ICD-10-CM | POA: Diagnosis not present

## 2022-07-29 DIAGNOSIS — J449 Chronic obstructive pulmonary disease, unspecified: Secondary | ICD-10-CM | POA: Diagnosis not present

## 2022-07-29 DIAGNOSIS — Z79899 Other long term (current) drug therapy: Secondary | ICD-10-CM | POA: Diagnosis not present

## 2022-07-29 DIAGNOSIS — Z86718 Personal history of other venous thrombosis and embolism: Secondary | ICD-10-CM | POA: Diagnosis not present

## 2022-07-29 DIAGNOSIS — Z7901 Long term (current) use of anticoagulants: Secondary | ICD-10-CM | POA: Diagnosis not present

## 2022-07-29 DIAGNOSIS — M48061 Spinal stenosis, lumbar region without neurogenic claudication: Secondary | ICD-10-CM | POA: Diagnosis not present

## 2022-07-29 DIAGNOSIS — I10 Essential (primary) hypertension: Secondary | ICD-10-CM | POA: Diagnosis not present

## 2022-07-29 DIAGNOSIS — I2510 Atherosclerotic heart disease of native coronary artery without angina pectoris: Secondary | ICD-10-CM | POA: Diagnosis not present

## 2022-07-29 DIAGNOSIS — E119 Type 2 diabetes mellitus without complications: Secondary | ICD-10-CM | POA: Diagnosis not present

## 2022-07-29 LAB — GLUCOSE, CAPILLARY: Glucose-Capillary: 150 mg/dL — ABNORMAL HIGH (ref 70–99)

## 2022-07-29 MED ORDER — HYDROCODONE-ACETAMINOPHEN 5-325 MG PO TABS: 1.0000 | ORAL_TABLET | ORAL | 0 refills | Status: AC | PRN

## 2022-07-29 NOTE — Discharge Summary (Signed)
Physician Discharge Summary  Patient ID: Nathan Fernandez MRN: [id number] DOB/AGE: Jul 14, 1941 81 y.o. Estimated body mass index is 25.84 kg/m as calculated from the following:   Height as of this encounter: '5\' 9"'$  (1.753 m).   Weight as of this encounter: 79.4 kg.   Admit date: 07/28/2022 Discharge date: 07/29/2022  Admission Diagnoses: Lumbar spinal stenosis L2-3  Discharge Diagnoses: Same Principal Problem:   Spinal stenosis of lumbar region   Discharged Condition: good  Hospital Course: Patient was admitted to the hospital underwent decompressive laminectomy L2-3 with redo decompression of the L3 nerve roots.  Postop patient did very well recovered in the floor on the floor was ambulating and voiding tolerating regular diet and stable for discharge home.  Patient be discharged scheduled follow-up in 1 to 2 weeks.  Consults: Significant Diagnostic Studies: Treatments: Redo decompressive laminectomy L2-3 with foraminotomies of the L2 and L3 nerve roots Discharge Exam: Blood pressure (!) 140/86, pulse 84, temperature 98.4 F (36.9 C), temperature source Oral, resp. rate 16, height '5\' 9"'$  (1.753 m), weight 79.4 kg, SpO2 92 %. Strength 5-5 wound clean dry and intact  Disposition: Home   Allergies as of 07/29/2022       Reactions   Ace Inhibitors Swelling   Swelling of the tongue   Angiotensin Receptor Blockers Swelling   Tongue swelling   Vasotec [enalapril] Swelling   Tongue swelling   Pravachol [pravastatin] Other (See Comments)   Myalgias         Medication List     TAKE these medications    acetaminophen 325 MG tablet Commonly known as: Tylenol Take 1.5 tablets (487.5 mg total) by mouth 2 (two) times daily. What changed:  how much to take when to take this reasons to take this   albuterol 108 (90 Base) MCG/ACT inhaler Commonly known as: VENTOLIN HFA Inhale 1-2 puffs into the lungs every 6 (six) hours as needed (COPD). What changed: how much to take    aspirin EC 81 MG tablet Take 81 mg by mouth daily.   B Complex Caps Take 1 capsule by mouth daily.   buPROPion 150 MG 12 hr tablet Commonly known as: WELLBUTRIN SR TAKE 1 TABLET BY MOUTH EVERYDAY AT BEDTIME What changed: See the new instructions.   carvedilol 6.25 MG tablet Commonly known as: COREG TAKE 1 TABLET BY MOUTH TWICE A DAY   cyclobenzaprine 5 MG tablet Commonly known as: FLEXERIL Take 1 tablet (5 mg total) by mouth 3 (three) times daily as needed.   dorzolamide-timolol 22.3-6.8 MG/ML ophthalmic solution Commonly known as: COSOPT Place 1 drop into both eyes 2 (two) times daily.   ELDERBERRY PO Take 1 capsule by mouth daily.   fenofibrate 48 MG tablet Commonly known as: TRICOR TAKE 1 TABLET BY MOUTH EVERY DAY   gabapentin 100 MG capsule Commonly known as: NEURONTIN Take 1 capsule (100 mg total) by mouth 2 (two) times daily. What changed:  when to take this reasons to take this   hydrALAZINE 10 MG tablet Commonly known as: APRESOLINE TAKE 1 TABLET EVERY 12 HOURS. NEEDS OV FOR MORE REFILLS What changed: See the new instructions.   HYDROcodone-acetaminophen 5-325 MG tablet Commonly known as: NORCO/VICODIN Take 1 tablet by mouth every 4 (four) hours as needed for moderate pain.   isosorbide mononitrate 30 MG 24 hr tablet Commonly known as: IMDUR TAKE 1/2 OF A TABLET (15 MG TOTAL) BY MOUTH DAILY What changed: See the new instructions.   ketoconazole 2 % cream Commonly known  as: NIZORAL Apply 1 application topically daily as needed for irritation.   nitroGLYCERIN 0.4 MG SL tablet Commonly known as: NITROSTAT Place 1 tablet (0.4 mg total) under the tongue every 5 (five) minutes as needed for chest pain. Up to 3 doses   oxyCODONE-acetaminophen 5-325 MG tablet Commonly known as: PERCOCET/ROXICET Take 0.5-1 tablets by mouth every 8 (eight) hours as needed for severe pain. What changed: how much to take   rivaroxaban 20 MG Tabs tablet Commonly known as:  Xarelto Take 1 tablet (20 mg total) by mouth daily with supper.   rosuvastatin 20 MG tablet Commonly known as: CRESTOR TAKE 1 TABLET BY MOUTH EVERY DAY   traZODone 100 MG tablet Commonly known as: DESYREL TAKE 2 TABLETS BY MOUTH AT BEDTIME What changed:  when to take this reasons to take this   VITAMIN C PO Take 1 tablet by mouth daily.   VITAMIN D-3 PO Take 1 capsule by mouth daily.         Signed: Elaina Hoops 07/29/2022, 8:00 AM

## 2022-07-29 NOTE — Evaluation (Signed)
Occupational Therapy Evaluation Patient Details Name: Nathan Fernandez MRN: [id number] DOB: 12/21/1941 Today's Date: 07/29/2022   History of Present Illness Pt is an 81 y.o male s/p decompressive laminectomy L2-3 with foraminotomies of the L2 nerve root and Foraminotomies of the L3 nerve root partial medial facetectomies. PMH significat for cardiac catheterization, COPD, cancer, COPD, DM2, HTN, MI, OA, and CAD.   Clinical Impression   PTA, pt lived with his wife and was mod I for ADL, but wife frequently performed cooking and cleaning IADLs. Upon eval, pt able to recall 3/3 spinal precautions after initial education, but required min verbal cues during LB dressing to maintain precautions during new learning. Pt educated and demonstrating use of compensatory techniques for bed mobility, oral care, LB ADL, and shower transfers. Pt reporting wife available to assist at home. All education provided; all questions answered; reviewed importance of maintaining precautions. Recommend discharge home with no OT follow up. Re-consult if change in status.      Recommendations for follow up therapy are one component of a multi-disciplinary discharge planning process, led by the attending physician.  Recommendations may be updated based on patient status, additional functional criteria and insurance authorization.   Follow Up Recommendations  No OT follow up    Assistance Recommended at Discharge Intermittent Supervision/Assistance  Patient can return home with the following A little help with walking and/or transfers;A little help with bathing/dressing/bathroom;Assistance with cooking/housework;Assist for transportation;Help with stairs or ramp for entrance    Functional Status Assessment  Patient has had a recent decline in their functional status and demonstrates the ability to make significant improvements in function in a reasonable and predictable amount of time.  Equipment Recommendations  None  recommended by OT (Pt reporting he has a non-slip stool and would not like a 3:1 for the shower)    Recommendations for Other Services PT consult     Precautions / Restrictions Precautions Precautions: Back Precaution Booklet Issued: Yes (comment) Precaution Comments: All education provided. Pt requied min cues to maintain precautions during first half of session. Required Braces or Orthoses:  (no brace needed order) Restrictions Weight Bearing Restrictions: No      Mobility Bed Mobility Overal bed mobility: Needs Assistance Bed Mobility: Rolling, Sidelying to Sit Rolling: Supervision Sidelying to sit: Supervision       General bed mobility comments: Supervision for use of log roll technique    Transfers Overall transfer level: Needs assistance Equipment used: Rolling walker (2 wheels) Transfers: Sit to/from Stand Sit to Stand: Min guard           General transfer comment: Min guard A for safety      Balance Overall balance assessment: Needs assistance Sitting-balance support: No upper extremity supported Sitting balance-Leahy Scale: Good Sitting balance - Comments: Donning LB dressing sitting EOB no back support   Standing balance support: During functional activity, Bilateral upper extremity supported Standing balance-Leahy Scale: Poor Standing balance comment: Reliant on RW                           ADL either performed or assessed with clinical judgement   ADL Overall ADL's : Needs assistance/impaired Eating/Feeding: Modified independent;Sitting   Grooming: Min guard;Standing Grooming Details (indicate cue type and reason): Educated regarding compensatory techniques to perform within precautions Upper Body Bathing: Set up;Sitting   Lower Body Bathing: Min guard;Sit to/from stand   Upper Body Dressing : Set up;Sitting   Lower Body Dressing: Min guard;Sit  to/from stand Lower Body Dressing Details (indicate cue type and reason): Pt educated  and demonstrating use of compensatory techniques. Requiring min cues to perform within precautions initially when donning underpats. Better maintenance of precautions with donning pants. Toilet Transfer: Min guard;Ambulation;Rolling walker (2 wheels);Comfort height toilet Toilet Transfer Details (indicate cue type and reason): Simulated in room   Toileting - Clothing Manipulation Details (indicate cue type and reason): Reviewed posterior pericare within precautions Tub/ Shower Transfer: Min guard;Ambulation;Rolling walker (2 wheels) Tub/Shower Transfer Details (indicate cue type and reason): Performing with min Guard A Functional mobility during ADLs: Min guard;Rolling walker (2 wheels)       Vision Patient Visual Report: No change from baseline Vision Assessment?: No apparent visual deficits Additional Comments: Pt muting TV as therapist entered room. Scanning to locate needed items.     Perception     Praxis      Pertinent Vitals/Pain Pain Assessment Pain Assessment: Faces Faces Pain Scale: Hurts little more Pain Location: operative site Pain Descriptors / Indicators: Discomfort, Operative site guarding Pain Intervention(s): Limited activity within patient's tolerance, Monitored during session     Hand Dominance Right   Extremity/Trunk Assessment Upper Extremity Assessment Upper Extremity Assessment: Overall WFL for tasks assessed   Lower Extremity Assessment Lower Extremity Assessment: Defer to PT evaluation   Cervical / Trunk Assessment Cervical / Trunk Assessment: Back Surgery   Communication Communication Communication: No difficulties   Cognition Arousal/Alertness: Awake/alert Behavior During Therapy: WFL for tasks assessed/performed Overall Cognitive Status: Within Functional Limits for tasks assessed                                 General Comments: Pt recalling 3/3 spinal precautions after initial education; min cues initially to maintain  precautions. Pt pleasant and conversational; enjoys making jokes.     General Comments  VSS.    Exercises     Shoulder Instructions      Home Living Family/patient expects to be discharged to:: Private residence Living Arrangements: Spouse/significant other Available Help at Discharge: Family;Available 24 hours/day Type of Home: House Home Access: Stairs to enter [employer] of Steps: 3 Entrance Stairs-Rails: Can reach both Home Layout: One level     Bathroom Shower/Tub: Occupational psychologist: Standard     Home Equipment: Cane - single Barista (2 wheels);Adaptive equipment (Shower stool) Adaptive Equipment: Reacher;Long-handled shoe horn Additional Comments: Reports he has a shower stool at home. Reviewed use of shoer chair or 3:1 and pt reporting he does not need or want one      Prior Functioning/Environment Prior Level of Function : Independent/Modified Independent             Mobility Comments: Pt reporting occasional use of cane ADLs Comments: Pt reporting independent and driving. Reporting wife performied cooking and cleaning "because he was spoilt"        OT Problem List: Decreased strength;Decreased activity tolerance;Impaired balance (sitting and/or standing);Pain;Decreased knowledge of precautions;Decreased knowledge of use of DME or AE      OT Treatment/Interventions:      OT Goals(Current goals can be found in the care plan section) Acute Rehab OT Goals Patient Stated Goal: Go home OT Goal Formulation: With patient  OT Frequency:      Co-evaluation              AM-PAC OT "6 Clicks" Daily Activity     Outcome Measure Help from another  person eating meals?: None Help from another person taking care of personal grooming?: A Little Help from another person toileting, which includes using toliet, bedpan, or urinal?: A Little Help from another person bathing (including washing, rinsing, drying)?: A  Little Help from another person to put on and taking off regular upper body clothing?: A Little Help from another person to put on and taking off regular lower body clothing?: A Little 6 Click Score: 19   End of Session Equipment Utilized During Treatment: Gait belt;Rolling walker (2 wheels) Nurse Communication: Mobility status  Activity Tolerance: Patient tolerated treatment well Patient left: in bed;with call bell/phone within reach (Sitting EOB)  OT Visit Diagnosis: Unsteadiness on feet (R26.81);Muscle weakness (generalized) (M62.81);Pain Pain - part of body:  (operative site)                Time: 8616-8372 OT Time Calculation (min): 24 min Charges:  OT General Charges $OT Visit: 1 Visit OT Evaluation $OT Eval Low Complexity: 1 Low OT Treatments $Self Care/Home Management : 8-22 mins  Shanda Howells, OTR/L [facility] Acute Rehabilitation Office: [phone]   Lula Olszewski 07/29/2022, 10:40 AM

## 2022-07-29 NOTE — Discharge Instructions (Signed)
Wound Care  Keep the incision clean and dry remove the outer dressing in 3 days, leave the Steri-Strips intact. Do not put any creams, lotions, or ointments on incision. Leave steri-strips on back.  They will fall off by themselves.  Activity Walk each and every day, increasing distance each day. No lifting greater than 5 lbs.  No lifting no bending no twisting no driving or riding a car unless coming back and forth to see me. If provided with back brace, wear when out of bed.  It is not necessary to wear brace in bed. Diet Resume your normal diet.   Return to Work Will be discussed at you follow up appointment.  Call Your Doctor If Any of These Occur Redness, drainage, or swelling at the wound.  Temperature greater than 101 degrees. Severe pain not relieved by pain medication. Incision starts to come apart. Follow Up Appt Call today for appointment in 1-2 weeks ([phone]) or for problems.  If you have any hardware placed in your spine, you will need an x-ray before your appointment.

## 2022-07-29 NOTE — Progress Notes (Signed)
Patient alert and oriented, voiding adequately, MAE well with no difficulty. Incision area cdi with no s/s of infection. Patient discharged home per order. Patient and husband stated understanding of discharge instructions given. Patient has an appointment with Dr. Saintclair Halsted in 2 weeks

## 2022-07-29 NOTE — Evaluation (Signed)
Physical Therapy Evaluation  Patient Details Name: Nathan Fernandez MRN: [id number] DOB: Apr 17, 1941 Today's Date: 07/29/2022  History of Present Illness  Pt is an 81 y/o male who presents s/p decompressive laminectomy L2-3 on 07/28/2022. PMH significant for cardiac catheterization, COPD, cancer, COPD, DM2, HTN, MI, OA, and CAD.   Clinical Impression  Pt admitted with above diagnosis. At the time of PT eval, pt was able to demonstrate transfers and ambulation with gross min guard to min assist for balance support and safety. Pt with several losses of balance, requiring assist to recover. Feel he would benefit from an AD however pt declines using one. Pt was educated on precautions, positioning recommendations, appropriate activity progression, and car transfer. Pt currently with functional limitations due to the deficits listed below (see PT Problem List). Pt will benefit from skilled PT to increase their independence and safety with mobility to allow discharge to the venue listed below.         Recommendations for follow up therapy are one component of a multi-disciplinary discharge planning process, led by the attending physician.  Recommendations may be updated based on patient status, additional functional criteria and insurance authorization.  Follow Up Recommendations No PT follow up      Assistance Recommended at Discharge Intermittent Supervision/Assistance  Patient can return home with the following  A little help with walking and/or transfers;A little help with bathing/dressing/bathroom;Assistance with cooking/housework;Assist for transportation;Help with stairs or ramp for entrance    Equipment Recommendations None recommended by PT  Recommendations for Other Services       Functional Status Assessment Patient has had a recent decline in their functional status and demonstrates the ability to make significant improvements in function in a reasonable and predictable amount of time.      Precautions / Restrictions Precautions Precautions: Back Precaution Booklet Issued: Yes (comment) Precaution Comments: Reviewed handout and pt was cued for precautions during functional mobility. Required Braces or Orthoses:  (no brace needed order) Restrictions Weight Bearing Restrictions: No      Mobility  Bed Mobility               General bed mobility comments: Pt was received sitting up EOB.    Transfers Overall transfer level: Needs assistance Equipment used: None Transfers: Sit to/from Stand Sit to Stand: Min guard           General transfer comment: Close guard for safety as pt powered up to full stand. No overt LOB noted however pt appeared guarded.    Ambulation/Gait Ambulation/Gait assistance: Min assist Gait Distance (Feet): 300 Feet Assistive device: None Gait Pattern/deviations: Step-through pattern, Decreased stride length, Drifts right/left, Staggering right Gait velocity: Decreased Gait velocity interpretation: 1.31 - 2.62 ft/sec, indicative of limited community ambulator   General Gait Details: Pt holding to railings in the hallway, and appears unsteady at times. Hands on assist throughout, and pt with several losses of balance, requiring min assist to recover. Pt refused trying with the cane or the walker.  Stairs Stairs:  (Pt declined practicing stairs)          Wheelchair Mobility    Modified Rankin (Stroke Patients Only)       Balance Overall balance assessment: Needs assistance Sitting-balance support: No upper extremity supported Sitting balance-Leahy Scale: Fair     Standing balance support: During functional activity, Bilateral upper extremity supported Standing balance-Leahy Scale: Poor  Pertinent Vitals/Pain Pain Assessment Pain Assessment: Faces Faces Pain Scale: Hurts little more Pain Location: operative site Pain Descriptors / Indicators: Discomfort, Operative site  guarding Pain Intervention(s): Limited activity within patient's tolerance, Monitored during session, Repositioned    Home Living Family/patient expects to be discharged to:: Private residence Living Arrangements: Spouse/significant other Available Help at Discharge: Family;Available 24 hours/day Type of Home: House Home Access: Stairs to enter Entrance Stairs-Rails: Can reach both Entrance Stairs-Number of Steps: 3   Home Layout: One level Home Equipment: Cane - single Barista (2 wheels);Adaptive equipment (Shower stool) Additional Comments: Reports he has a shower stool at home. Reviewed use of shoer chair or 3:1 and pt reporting he does not need or want one    Prior Function Prior Level of Function : Independent/Modified Independent             Mobility Comments: Pt reporting occasional use of cane, states he was ambulating ~1 mile a day. ADLs Comments: Pt reporting independent and driving.     Hand Dominance   Dominant Hand: Right    Extremity/Trunk Assessment   Upper Extremity Assessment Upper Extremity Assessment: Overall WFL for tasks assessed    Lower Extremity Assessment Lower Extremity Assessment: Generalized weakness (Consistent with pre-op diagnosis)    Cervical / Trunk Assessment Cervical / Trunk Assessment: Back Surgery  Communication   Communication: No difficulties  Cognition Arousal/Alertness: Awake/alert Behavior During Therapy: WFL for tasks assessed/performed Overall Cognitive Status: Within Functional Limits for tasks assessed                                          General Comments General comments (skin integrity, edema, etc.): VSS.    Exercises     Assessment/Plan    PT Assessment Patient needs continued PT services  PT Problem List Decreased strength;Decreased activity tolerance;Decreased balance;Decreased mobility;Decreased knowledge of use of DME;Decreased safety awareness;Decreased knowledge of  precautions;Pain       PT Treatment Interventions DME instruction;Gait training;Stair training;Functional mobility training;Therapeutic activities;Therapeutic exercise;Neuromuscular re-education;Patient/family education    PT Goals (Current goals can be found in the Care Plan section)  Acute Rehab PT Goals Patient Stated Goal: Get back to walking several miles a day PT Goal Formulation: With patient Time For Goal Achievement: 08/05/22 Potential to Achieve Goals: Good    Frequency Min 5X/week     Co-evaluation               AM-PAC PT "6 Clicks" Mobility  Outcome Measure Help needed turning from your back to your side while in a flat bed without using bedrails?: None Help needed moving from lying on your back to sitting on the side of a flat bed without using bedrails?: A Little Help needed moving to and from a bed to a chair (including a wheelchair)?: A Little Help needed standing up from a chair using your arms (e.g., wheelchair or bedside chair)?: A Little Help needed to walk in hospital room?: A Little Help needed climbing 3-5 steps with a railing? : A Little 6 Click Score: 19    End of Session Equipment Utilized During Treatment: Gait belt Activity Tolerance: Patient tolerated treatment well Patient left: in bed;with call bell/phone within reach Nurse Communication: Mobility status PT Visit Diagnosis: Unsteadiness on feet (R26.81);Pain Pain - part of body:  (back)    Time: 1037-1050 PT Time Calculation (min) (ACUTE ONLY): 13 min  Charges:   PT Evaluation $PT Eval Low Complexity: [place], PT, DPT Acute Rehabilitation Services Secure Chat Preferred Office: [phone]   Thelma Comp 07/29/2022, 12:22 PM

## 2022-07-29 NOTE — Plan of Care (Signed)
  Problem: Skin Integrity: Goal: Risk for impaired skin integrity will decrease Outcome: Completed/Met   Problem: Education: Goal: Ability to verbalize activity precautions or restrictions will improve Outcome: Completed/Met Goal: Knowledge of the prescribed therapeutic regimen will improve Outcome: Completed/Met Goal: Understanding of discharge needs will improve Outcome: Completed/Met   Problem: Activity: Goal: Ability to avoid complications of mobility impairment will improve Outcome: Completed/Met Goal: Ability to tolerate increased activity will improve Outcome: Completed/Met Goal: Will remain free from falls Outcome: Completed/Met   Problem: Bowel/Gastric: Goal: Gastrointestinal status for postoperative course will improve Outcome: Completed/Met   Problem: Clinical Measurements: Goal: Ability to maintain clinical measurements within normal limits will improve Outcome: Completed/Met Goal: Postoperative complications will be avoided or minimized Outcome: Completed/Met Goal: Diagnostic test results will improve Outcome: Completed/Met   Problem: Pain Management: Goal: Pain level will decrease Outcome: Completed/Met   Problem: Skin Integrity: Goal: Will show signs of wound healing Outcome: Completed/Met   Problem: Skin Integrity: Goal: Will show signs of wound healing Outcome: Completed/Met   Problem: Health Behavior/Discharge Planning: Goal: Identification of resources available to assist in meeting health care needs will improve Outcome: Completed/Met   Problem: Bladder/Genitourinary: Goal: Urinary functional status for postoperative course will improve Outcome: Completed/Met   Problem: Education: Goal: Ability to describe self-care measures that may prevent or decrease complications (Diabetes Survival Skills Education) will improve Outcome: Completed/Met Goal: Individualized Educational Video(s) Outcome: Completed/Met   Problem: Coping: Goal: Ability to  adjust to condition or change in health will improve Outcome: Completed/Met   Problem: Fluid Volume: Goal: Ability to maintain a balanced intake and output will improve Outcome: Completed/Met   Problem: Health Behavior/Discharge Planning: Goal: Ability to identify and utilize available resources and services will improve Outcome: Completed/Met Goal: Ability to manage health-related needs will improve Outcome: Completed/Met   Problem: Metabolic: Goal: Ability to maintain appropriate glucose levels will improve Outcome: Completed/Met   Problem: Nutritional: Goal: Maintenance of adequate nutrition will improve Outcome: Completed/Met Goal: Progress toward achieving an optimal weight will improve Outcome: Completed/Met   Problem: Skin Integrity: Goal: Risk for impaired skin integrity will decrease Outcome: Completed/Met   Problem: Tissue Perfusion: Goal: Adequacy of tissue perfusion will improve Outcome: Completed/Met

## 2022-07-30 ENCOUNTER — Other Ambulatory Visit: Payer: Self-pay | Admitting: Cardiovascular Disease

## 2022-07-31 ENCOUNTER — Other Ambulatory Visit: Payer: Self-pay | Admitting: Cardiovascular Disease

## 2022-08-01 ENCOUNTER — Telehealth: Payer: Self-pay

## 2022-08-01 NOTE — Progress Notes (Unsigned)
Chronic Care Management Pharmacy Assistant   Name: ALESANDRO STUEVE  MRN: [id number] DOB: 1941-05-22  Reason for Encounter: CCM (Hosptial Follow Up)  Medications: Outpatient Encounter Medications as of 08/01/2022  Medication Sig Note   acetaminophen (TYLENOL) 325 MG tablet Take 1.5 tablets (487.5 mg total) by mouth 2 (two) times daily. (Patient taking differently: Take 650 mg by mouth 2 (two) times daily as needed for mild pain or headache.)    albuterol (VENTOLIN HFA) 108 (90 Base) MCG/ACT inhaler Inhale 1-2 puffs into the lungs every 6 (six) hours as needed (COPD). (Patient taking differently: Inhale 2 puffs into the lungs every 6 (six) hours as needed (COPD).)    Ascorbic Acid (VITAMIN C PO) Take 1 tablet by mouth daily.    aspirin 81 MG EC tablet Take 81 mg by mouth daily.    B Complex CAPS Take 1 capsule by mouth daily.    buPROPion (WELLBUTRIN SR) 150 MG 12 hr tablet TAKE 1 TABLET BY MOUTH EVERYDAY AT BEDTIME (Patient taking differently: Take 150 mg by mouth at bedtime.)    carvedilol (COREG) 6.25 MG tablet TAKE 1 TABLET BY MOUTH TWICE A DAY    Cholecalciferol (VITAMIN D-3 PO) Take 1 capsule by mouth daily.    cyclobenzaprine (FLEXERIL) 5 MG tablet Take 1 tablet (5 mg total) by mouth 3 (three) times daily as needed.    dorzolamide-timolol (COSOPT) 22.3-6.8 MG/ML ophthalmic solution Place 1 drop into both eyes 2 (two) times daily.    ELDERBERRY PO Take 1 capsule by mouth daily.    fenofibrate (TRICOR) 48 MG tablet TAKE 1 TABLET BY MOUTH EVERY DAY (Patient taking differently: Take 48 mg by mouth daily.)    gabapentin (NEURONTIN) 100 MG capsule Take 1 capsule (100 mg total) by mouth 2 (two) times daily. (Patient taking differently: Take 100 mg by mouth 2 (two) times daily as needed (nerve pain).)    hydrALAZINE (APRESOLINE) 10 MG tablet TAKE 1 TABLET EVERY 12 HOURS. NEEDS OV FOR MORE REFILLS    HYDROcodone-acetaminophen (NORCO/VICODIN) 5-325 MG tablet Take 1 tablet by mouth every 4 (four)  hours as needed for moderate pain.    isosorbide mononitrate (IMDUR) 30 MG 24 hr tablet TAKE 1/2 TABLET BY MOUTH EVERY DAY    ketoconazole (NIZORAL) 2 % cream Apply 1 application topically daily as needed for irritation.    nitroGLYCERIN (NITROSTAT) 0.4 MG SL tablet Place 1 tablet (0.4 mg total) under the tongue every 5 (five) minutes as needed for chest pain. Up to 3 doses 07/28/2022: Pt keeps in pocket for when needed   oxyCODONE-acetaminophen (PERCOCET/ROXICET) 5-325 MG tablet Take 0.5-1 tablets by mouth every 8 (eight) hours as needed for severe pain. (Patient taking differently: Take 1 tablet by mouth every 8 (eight) hours as needed for severe pain.)    rivaroxaban (XARELTO) 20 MG TABS tablet Take 1 tablet (20 mg total) by mouth daily with supper. (Patient not taking: Reported on 07/28/2022)    rosuvastatin (CRESTOR) 20 MG tablet TAKE 1 TABLET BY MOUTH EVERY DAY (Patient taking differently: Take 20 mg by mouth daily.)    traZODone (DESYREL) 100 MG tablet TAKE 2 TABLETS BY MOUTH AT BEDTIME (Patient taking differently: Take 200 mg by mouth at bedtime as needed for sleep.)    No facility-administered encounter medications on file as of 08/01/2022.   Reviewed hospital notes for details of recent visit. Patient has been contacted by Transitions of Care team: No  Admitted to the hospital on 07/28/22. Discharge date  was 07/29/22.  Discharged from [facility].   Discharge diagnosis (Principal Problem):  Spinal stenosis of lumbar region Patient was discharged to Home  Brief summary of hospital course:  Patient was admitted to the hospital underwent decompressive laminectomy L2-3 with redo decompression of the L3 nerve roots.  Postop patient did very well recovered in the floor on the floor was ambulating and voiding tolerating regular diet and stable for discharge home.  Patient be discharged scheduled follow-up in 1 to 2 weeks.   New?Medications Started at [facility] Discharge:?? -Started  HYDROcodone-acetaminophen (NORCO/VICODIN) 5-325 MG tablet   Medications that remain the same after Hospital Discharge:??  -All other medications will remain the same.    Next CCM appt: 10/22/22  Other upcoming appts: No appointments scheduled within the next 30 days.  Charlene Brooke, PharmD notified and will determine if action is needed.  Marijean Niemann, [place] Pharmacy Assistant [phone]

## 2022-08-07 ENCOUNTER — Ambulatory Visit (INDEPENDENT_AMBULATORY_CARE_PROVIDER_SITE_OTHER): Payer: Medicare Other | Admitting: *Deleted

## 2022-08-07 DIAGNOSIS — Z0000 Encounter for general adult medical examination without abnormal findings: Secondary | ICD-10-CM | POA: Diagnosis not present

## 2022-08-07 NOTE — Progress Notes (Signed)
Subjective:   Nathan Fernandez is a 81 y.o. male who presents for Medicare Annual/Subsequent preventive examination.  I connected with  Marthann Schiller on 08/07/22 by a telephone enabled telemedicine application and verified that I am speaking with the correct person using two identifiers.   I discussed the limitations of evaluation and management by telemedicine. The patient expressed understanding and agreed to proceed.  Patient location: home  Provider location: Tele-Health-home   Review of Systems     Cardiac Risk Factors include: advanced age (>51mn, >>75women);family history of premature cardiovascular disease;hypertension;sedentary lifestyle;male gender (patient  just had back surgery)     Objective:    Today's Vitals   08/07/22 1301  PainSc: 6    There is no height or weight on file to calculate BMI.     08/07/2022    1:03 PM 07/23/2022    2:33 PM 01/04/2022    1:22 PM 06/19/2021    6:00 PM 06/19/2021    8:01 AM 06/17/2021   10:34 AM 02/12/2021    5:23 AM  Advanced Directives  Does Patient Have a Medical Advance Directive? No No No Yes Yes Yes   Type of AScientist, research (medical)Living will  HOteroLiving will   Does patient want to make changes to medical advance directive?    No - Patient declined No - Guardian declined    Would patient like information on creating a medical advance directive? No - Patient declined No - Patient declined     No - Patient declined    Current Medications (verified) Outpatient Encounter Medications as of 08/07/2022  Medication Sig   acetaminophen (TYLENOL) 325 MG tablet Take 1.5 tablets (487.5 mg total) by mouth 2 (two) times daily. (Patient taking differently: Take 650 mg by mouth 2 (two) times daily as needed for mild pain or headache.)   albuterol (VENTOLIN HFA) 108 (90 Base) MCG/ACT inhaler Inhale 1-2 puffs into the lungs every 6 (six) hours as needed (COPD). (Patient taking differently:  Inhale 2 puffs into the lungs every 6 (six) hours as needed (COPD).)   Ascorbic Acid (VITAMIN C PO) Take 1 tablet by mouth daily.   aspirin 81 MG EC tablet Take 81 mg by mouth daily.   B Complex CAPS Take 1 capsule by mouth daily.   buPROPion (WELLBUTRIN SR) 150 MG 12 hr tablet TAKE 1 TABLET BY MOUTH EVERYDAY AT BEDTIME (Patient taking differently: Take 150 mg by mouth at bedtime.)   carvedilol (COREG) 6.25 MG tablet TAKE 1 TABLET BY MOUTH TWICE A DAY   Cholecalciferol (VITAMIN D-3 PO) Take 1 capsule by mouth daily.   cyclobenzaprine (FLEXERIL) 5 MG tablet Take 1 tablet (5 mg total) by mouth 3 (three) times daily as needed.   dorzolamide-timolol (COSOPT) 22.3-6.8 MG/ML ophthalmic solution Place 1 drop into both eyes 2 (two) times daily.   ELDERBERRY PO Take 1 capsule by mouth daily.   fenofibrate (TRICOR) 48 MG tablet TAKE 1 TABLET BY MOUTH EVERY DAY (Patient taking differently: Take 48 mg by mouth daily.)   gabapentin (NEURONTIN) 100 MG capsule Take 1 capsule (100 mg total) by mouth 2 (two) times daily. (Patient taking differently: Take 100 mg by mouth 2 (two) times daily as needed (nerve pain).)   hydrALAZINE (APRESOLINE) 10 MG tablet TAKE 1 TABLET EVERY 12 HOURS. NEEDS OV FOR MORE REFILLS   HYDROcodone-acetaminophen (NORCO/VICODIN) 5-325 MG tablet Take 1 tablet by mouth every 4 (four) hours as needed  for moderate pain.   isosorbide mononitrate (IMDUR) 30 MG 24 hr tablet TAKE 1/2 TABLET BY MOUTH EVERY DAY   ketoconazole (NIZORAL) 2 % cream Apply 1 application topically daily as needed for irritation.   nitroGLYCERIN (NITROSTAT) 0.4 MG SL tablet Place 1 tablet (0.4 mg total) under the tongue every 5 (five) minutes as needed for chest pain. Up to 3 doses   oxyCODONE-acetaminophen (PERCOCET/ROXICET) 5-325 MG tablet Take 0.5-1 tablets by mouth every 8 (eight) hours as needed for severe pain. (Patient taking differently: Take 1 tablet by mouth every 8 (eight) hours as needed for severe pain.)    rivaroxaban (XARELTO) 20 MG TABS tablet Take 1 tablet (20 mg total) by mouth daily with supper.   rosuvastatin (CRESTOR) 20 MG tablet TAKE 1 TABLET BY MOUTH EVERY DAY (Patient taking differently: Take 20 mg by mouth daily.)   traZODone (DESYREL) 100 MG tablet TAKE 2 TABLETS BY MOUTH AT BEDTIME (Patient taking differently: Take 200 mg by mouth at bedtime as needed for sleep.)   No facility-administered encounter medications on file as of 08/07/2022.    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   Hosp R facial weakness   CAD (coronary artery disease)    MI, Hosp 1993, PTCA; [place] 04/18/13: Distal left main 40%, proximal LAD with several aneurysmal segments, proximal LAD 50% prior to and after aneurysmal segments, area does not appear to flow-limiting, proximal diagonal 70%, ostial circumflex 20%, proximal circumflex 20%, OM1 30-40%, proximal RCA 40%, mid RCA 30%, distal RCA 20%, EF 50% => med Rx   Cancer ([place])    skin cancer - 2020   COPD (chronic obstructive pulmonary disease) (HCC)    DM2 (diabetes mellitus, type 2) ([place])    DVT (deep venous thrombosis) ([place]) 12/2021   History of ETT 1998   wnl   History of hiatal hernia    History of MRI 08/04/2009   brain- atrophy sm vess dz   HLD (hyperlipidemia)    HTN (hypertension)    Myocardial infarction (HCC)    OA (osteoarthritis)    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[place])    05/09/12 [place]: 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, large collateral proximal to aneurysm   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 2/29/79   Past Surgical History:  Procedure Laterality Date   BUBBLE STUDY  05/15/2020   Procedure: BUBBLE STUDY;  Surgeon: Pixie Casino, MD;  Location: [facility];  Service: Cardiovascular;;   CARDIAC CATHETERIZATION      cataract surgery  08/2005   repair lens which moved   [place] CATH AND CORONARY ANGIOGRAPHY N/A 01/21/2019   Procedure: LEFT HEART CATH AND CORONARY ANGIOGRAPHY;  Surgeon: Burnell Blanks, MD;  Location: [facility];  Service: Cardiovascular;  Laterality: N/A;   LEFT HEART CATH AND CORONARY ANGIOGRAPHY N/A 02/13/2021   Procedure: LEFT HEART CATH AND CORONARY ANGIOGRAPHY;  Surgeon: Wellington Hampshire, MD;  Location: [facility];  Service: Cardiovascular;  Laterality: N/A;   LOOP RECORDER INSERTION N/A 05/15/2020   Procedure: LOOP RECORDER INSERTION;  Surgeon: Thompson Grayer, MD;  Location: [facility];  Service: Cardiovascular;  Laterality: N/A;   LUMBAR LAMINECTOMY/DECOMPRESSION MICRODISCECTOMY N/A 06/19/2021   Procedure: Lumbar Two-Three, Lumbar Three-Four, Lumbar Four-Five Laminectomy and Foraminotomy;  Surgeon: Saintclair Halsted,  Dominica Severin, MD;  Location: [facility];  Service: Neurosurgery;  Laterality: N/A;   LUMBAR LAMINECTOMY/DECOMPRESSION MICRODISCECTOMY Bilateral 07/28/2022   Procedure: Sublaminar decompression - Lumbar two-Lumbar three - bilateral redo;  Surgeon: Kary Kos, MD;  Location: [facility];  Service: Neurosurgery;  Laterality: Bilateral;  3C   stress myoview  06/29/2006   sm distal anteroseptal & apical infarct   TEE WITHOUT CARDIOVERSION N/A 05/15/2020   Procedure: TRANSESOPHAGEAL ECHOCARDIOGRAM (TEE);  Surgeon: Pixie Casino, MD;  [facility];  Service: Cardiovascular;  Laterality: N/A;   THYROIDECTOMY, PARTIAL  1967   B9 growth   TONSILLECTOMY     Family History  Problem Relation Age of Onset   Hypertension Mother    Aneurysm Mother    Stroke Mother    Hypertension Father    Heart disease Father        CAD   Heart disease Son        MI   Colon cancer Neg Hx    Prostate cancer Neg Hx    Social History   Socioeconomic History   Marital status: Married    Spouse name: Not on file   Number of children: 4   Years of education: Not  on file   Highest education level: Not on file  Occupational History   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 [employer]   Occupation: retired  Tobacco Use   Smoking status: Former    Types: Cigars   Smokeless tobacco: Never   Tobacco comments:    cigar occassionally  Vaping Use   Vaping Use: Never used  Substance and Sexual Activity   Alcohol use: Yes    Comment: occasional   Drug use: No   Sexual activity: Not on file  Other Topics Concern   Not on file  Social History Narrative   Divorced, lives with partner [name])- married 09/2015   3 living children, had a daughter who died at age 72 in Nov 02, 2023   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, retired as of 2019   Social Determinants of Health   Financial Resource Strain: [place]  (08/07/2022)   Overall Financial Resource Strain (CARDIA)    Difficulty of Paying Living Expenses: Not hard at all  Food Insecurity: No Food Insecurity (08/07/2022)   Hunger Vital Sign    Worried About Running Out of Food in the Last Year: Never true    [place] in the Last Year: Never true  Transportation Needs: No Transportation Needs (08/07/2022)   PRAPARE - Hydrologist (Medical): No    Lack of Transportation (Non-Medical): No  Physical Activity: Inactive (08/07/2022)   Exercise Vital Sign    Days of Exercise per Week: 0 days    Minutes of Exercise per Session: 0 min  Stress: No Stress Concern Present (08/07/2022)   [place]    Feeling of Stress : Not at all  Social Connections: [place] (08/07/2022)   Social Connection and Isolation Panel [NHANES]    Frequency of Communication with Friends and Family: More than three times a week    Frequency of Social Gatherings with Friends and Family: More than three times a week    Attends Religious Services: More than 4 times per year     Active Member of [employer] or Organizations: Yes    Attends Archivist Meetings: More than 4 times per year  Marital Status: Married    Tobacco Counseling Counseling given: Not Answered Tobacco comments: cigar occassionally   Clinical Intake:  Pre-visit preparation completed: Yes  Pain : 0-10 Pain Score: 6  Pain Type: Acute pain (back surgery) Pain Location: Back Pain Descriptors / Indicators: Burning, Constant, Aching Pain Onset: In the past 7 days Pain Frequency: Constant Pain Relieving Factors: just had back surgery Effect of Pain on Daily Activities: yes  Pain Relieving Factors: just had back surgery  Nutritional Risks: None Diabetes: No  How often do you need to have someone help you when you read instructions, pamphlets, or other written materials from your doctor or pharmacy?: 1 - Never  Diabetic?  no  Interpreter Needed?: No  Information entered by :: Leroy Kennedy LPN   Activities of Daily Living    08/07/2022    1:09 PM 07/23/2022    2:36 PM  In your present state of health, do you have any difficulty performing the following activities:  Hearing? 1   Vision? 0   Difficulty concentrating or making decisions? 0   Walking or climbing stairs? 0   Dressing or bathing? 0   Doing errands, shopping? 0 0  Preparing Food and eating ? N   Using the Toilet? N   In the past six months, have you accidently leaked urine? N   Do you have problems with loss of bowel control? N   Managing your Medications? N   Managing your Finances? N   Housekeeping or managing your Housekeeping? N     Patient Care Team: Tonia Ghent, MD as PCP - General (Family Medicine) Josue Hector, MD as PCP - Cardiology (Cardiology) Josue Hector, MD as Consulting Physician (Cardiology) Charlton Haws, [facility] as Pharmacist (Pharmacist)  Indicate any recent Medical Services you may have received from other than [facility] providers in the past year (date may be approximate).      Assessment:   This is a routine wellness examination for [place].  Hearing/Vision screen Hearing Screening - Comments:: Has some trouble hearing Does not have hearing aids Vision Screening - Comments:: Up to date [facility] unsure of name  Dietary issues and exercise activities discussed: Current Exercise Habits: The patient does not participate in regular exercise at present (patient has just back surgery.), Exercise limited by: orthopedic condition(s)   Goals Addressed             This Visit's Progress    Patient Stated       No goals       Depression Screen    08/07/2022    1:11 PM 05/01/2022    3:43 PM 08/31/2020   11:28 AM 08/30/2019    9:56 AM 08/24/2018   10:01 AM 02/16/2017   11:23 AM 02/08/2016   10:11 AM  PHQ 2/9 Scores  PHQ - 2 Score 0 0 0 0 0 0 0  PHQ- 9 Score   0 0 0      Fall Risk    08/07/2022    1:04 PM 05/01/2022    3:43 PM 08/31/2020   11:27 AM 08/30/2019    9:56 AM 08/24/2018   10:01 AM  [place] in the past year? 0 0 0 0 No  Number falls in past yr: 0 0 0    Injury with Fall? 0 0 0    Risk for fall due to :  No Fall Risks Medication side effect;Impaired balance/gait Medication side effect   Follow  up Falls evaluation completed;Education provided;Falls prevention discussed Falls evaluation completed Falls evaluation completed;Falls prevention discussed Falls evaluation completed;Falls prevention discussed     FALL RISK PREVENTION PERTAINING TO THE HOME:  Any stairs in or around the home? No  If so, are there any without handrails? No  Home free of loose throw rugs in walkways, pet beds, electrical cords, etc? Yes  Adequate lighting in your home to reduce risk of falls? Yes   ASSISTIVE DEVICES UTILIZED TO PREVENT FALLS:  Life alert? No  Use of a cane, walker or w/c? No  Grab bars in the bathroom? No  Shower chair or bench in shower? No  Elevated toilet seat or a handicapped toilet? No   TIMED UP AND GO:  Was the test performed? No  .    Cognitive Function:    08/31/2020   11:30 AM 08/30/2019   10:00 AM 08/24/2018    3:31 PM 02/16/2017   11:23 AM 02/08/2016   11:21 AM  MMSE - Mini Mental State Exam  Orientation to time '5 5 5 5 5  '$ Orientation to Place '5 5 5 5 5  '$ Registration '3 3 3 3 3  '$ Attention/ Calculation 5 5 0 0 5  Recall '3 3 3 1 3  '$ Recall-comments    pt was unable to recall 2 of 3 words   Language- name 2 objects  0 0 0   Language- repeat '1 1 1 1 1  '$ Language- follow 3 step command  0 '3 3 3  '$ Language- read & follow direction  0 0 0 1  Write a sentence  0 0 0   Copy design  0 0 0   Total score  '22 20 18         '$ 08/07/2022    1:06 PM  6CIT Screen  What Year? 0 points  What month? 0 points  What time? 0 points  Count back from 20 0 points  Months in reverse 0 points  Repeat phrase 0 points  Total Score 0 points    Immunizations Immunization History  Administered Date(s) Administered   Influenza Split 01/07/2012   Influenza,inj,Quad PF,6+ Mos 02/08/2016   PFIZER(Purple Top)SARS-COV-2 Vaccination 02/13/2020, 03/05/2020   Pneumococcal Conjugate-13 02/08/2016   Pneumococcal Polysaccharide-23 09/07/2020   Td 09/01/2001    TDAP status: Due, Education has been provided regarding the importance of this vaccine. Advised may receive this vaccine at local pharmacy or Health Dept. Aware to provide a copy of the vaccination record if obtained from local pharmacy or Health Dept. Verbalized acceptance and understanding.  Flu Vaccine status: Due, Education has been provided regarding the importance of this vaccine. Advised may receive this vaccine at local pharmacy or Health Dept. Aware to provide a copy of the vaccination record if obtained from local pharmacy or Health Dept. Verbalized acceptance and understanding.  Pneumococcal vaccine status: Up to date  Covid-19 vaccine status: Information provided on how to obtain vaccines.   Qualifies for Shingles Vaccine? Yes   Zostavax completed No   Shingrix  Completed?: No.    Education has been provided regarding the importance of this vaccine. Patient has been advised to call insurance company to determine out of pocket expense if they have not yet received this vaccine. Advised may also receive vaccine at local pharmacy or Health Dept. Verbalized acceptance and understanding.  Screening Tests Health Maintenance  Topic Date Due   OPHTHALMOLOGY EXAM  08/17/2021   FOOT EXAM  09/07/2021   INFLUENZA VACCINE  07/22/2022  COVID-19 Vaccine (3 - Pfizer risk series) 08/23/2022 (Originally 04/02/2020)   Zoster Vaccines- Shingrix (1 of 2) 11/07/2022 (Originally 09/12/1960)   TETANUS/TDAP  08/08/2023 (Originally 09/02/2011)   HEMOGLOBIN A1C  01/23/2023   Pneumonia Vaccine 73+ Years old  Completed   HPV VACCINES  Aged Out    Health Maintenance  Health Maintenance Due  Topic Date Due   OPHTHALMOLOGY EXAM  08/17/2021   FOOT EXAM  09/07/2021   INFLUENZA VACCINE  07/22/2022    Colorectal cancer screening: No longer required.   Lung Cancer Screening: (Low Dose CT Chest recommended if Age 20-80 years, 30 pack-year currently smoking OR have quit w/in 15years.) does not qualify.   Lung Cancer Screening Referral:   Additional Screening:  Hepatitis C Screening: does not qualify; Completed   Vision Screening: Recommended annual ophthalmology exams for early detection of glaucoma and other disorders of the eye. Is the patient up to date with their annual eye exam?  Yes  Who is the provider or what is the name of the office in which the patient attends annual eye exams? Unsure of name If pt is not established with a provider, would they like to be referred to a provider to establish care? No .   Dental Screening: Recommended annual dental exams for proper oral hygiene  Community Resource Referral / Chronic Care Management: CRR required this visit?  No   CCM required this visit?  No      Plan:     I have personally reviewed and noted the  following in the patient's chart:   Medical and social history Use of alcohol, tobacco or illicit drugs  Current medications and supplements including opioid prescriptions. Patient is currently taking opioid prescriptions. Information provided to patient regarding non-opioid alternatives. Patient advised to discuss non-opioid treatment plan with their provider. Functional ability and status Nutritional status Physical activity Advanced directives List of other physicians Hospitalizations, surgeries, and ER visits in previous 12 months Vitals Screenings to include cognitive, depression, and falls Referrals and appointments  In addition, I have reviewed and discussed with patient certain preventive protocols, quality metrics, and best practice recommendations. A written personalized care plan for preventive services as well as general preventive health recommendations were provided to patient.     Leroy Kennedy, LPN   0/92/3300   Nurse Notes: Patient is recovering from back surgery , will resume exercise when able ,  is not using a cane or walker.  Has support at home from wife while recovering.

## 2022-08-07 NOTE — Patient Instructions (Signed)
Nathan Fernandez , Thank you for taking time to come for your Medicare Wellness Visit. I appreciate your ongoing commitment to your health goals. Please review the following plan we discussed and let me know if I can assist you in the future.   Screening recommendations/referrals: Colonoscopy: no longer required Recommended yearly ophthalmology/optometry visit for glaucoma screening and checkup Recommended yearly dental visit for hygiene and checkup  Vaccinations: Influenza vaccine: Education provided Pneumococcal vaccine: up to date Tdap vaccine: Education provided Shingles vaccine: Education provided    Advanced directives: Education provided    Preventive Care 3 Years and Older, Male Preventive care refers to lifestyle choices and visits with your health care provider that can promote health and wellness. What does preventive care include? A yearly physical exam. This is also called an annual well check. Dental exams once or twice a year. Routine eye exams. Ask your health care provider how often you should have your eyes checked. Personal lifestyle choices, including: Daily care of your teeth and gums. Regular physical activity. Eating a healthy diet. Avoiding tobacco and drug use. Limiting alcohol use. Practicing safe sex. Taking low doses of aspirin every day. Taking vitamin and mineral supplements as recommended by your health care provider. What happens during an annual well check? The services and screenings done by your health care provider during your annual well check will depend on your age, overall health, lifestyle risk factors, and family history of disease. Counseling  Your health care provider may ask you questions about your: Alcohol use. Tobacco use. Drug use. Emotional well-being. Home and relationship well-being. Sexual activity. Eating habits. History of falls. Memory and ability to understand (cognition). Work and work Statistician. Screening  You may  have the following tests or measurements: Height, weight, and BMI. Blood pressure. Lipid and cholesterol levels. These may be checked every 5 years, or more frequently if you are over 21 years old. Skin check. Lung cancer screening. You may have this screening every year starting at age 39 if you have a 30-pack-year history of smoking and currently smoke or have quit within the past 15 years. Fecal occult blood test (FOBT) of the stool. You may have this test every year starting at age 50. Flexible sigmoidoscopy or colonoscopy. You may have a sigmoidoscopy every 5 years or a colonoscopy every 10 years starting at age 83. Prostate cancer screening. Recommendations will vary depending on your family history and other risks. Hepatitis C blood test. Hepatitis B blood test. Sexually transmitted disease (STD) testing. Diabetes screening. This is done by checking your blood sugar (glucose) after you have not eaten for a while (fasting). You may have this done every 1-3 years. Abdominal aortic aneurysm (AAA) screening. You may need this if you are a current or former smoker. Osteoporosis. You may be screened starting at age 38 if you are at high risk. Talk with your health care provider about your test results, treatment options, and if necessary, the need for more tests. Vaccines  Your health care provider may recommend certain vaccines, such as: Influenza vaccine. This is recommended every year. Tetanus, diphtheria, and acellular pertussis (Tdap, Td) vaccine. You may need a Td booster every 10 years. Zoster vaccine. You may need this after age 39. Pneumococcal 13-valent conjugate (PCV13) vaccine. One dose is recommended after age 59. Pneumococcal polysaccharide (PPSV23) vaccine. One dose is recommended after age 77. Talk to your health care provider about which screenings and vaccines you need and how often you need them. This information is  not intended to replace advice given to you by your  health care provider. Make sure you discuss any questions you have with your health care provider. Document Released: 01/04/2016 Document Revised: 08/27/2016 Document Reviewed: 10/09/2015 Elsevier Interactive Patient Education  2017 [place] Prevention in the Home Falls can cause injuries. They can happen to people of all ages. There are many things you can do to make your home safe and to help prevent falls. What can I do on the outside of my home? Regularly fix the edges of walkways and driveways and fix any cracks. Remove anything that might make you trip as you walk through a door, such as a raised step or threshold. Trim any bushes or trees on the path to your home. Use bright outdoor lighting. Clear any walking paths of anything that might make someone trip, such as rocks or tools. Regularly check to see if handrails are loose or broken. Make sure that both sides of any steps have handrails. Any raised decks and porches should have guardrails on the edges. Have any leaves, snow, or ice cleared regularly. Use sand or salt on walking paths during winter. Clean up any spills in your garage right away. This includes oil or grease spills. What can I do in the bathroom? Use night lights. Install grab bars by the toilet and in the tub and shower. Do not use towel bars as grab bars. Use non-skid mats or decals in the tub or shower. If you need to sit down in the shower, use a plastic, non-slip stool. Keep the floor dry. Clean up any water that spills on the floor as soon as it happens. Remove soap buildup in the tub or shower regularly. Attach bath mats securely with double-sided non-slip rug tape. Do not have throw rugs and other things on the floor that can make you trip. What can I do in the bedroom? Use night lights. Make sure that you have a light by your bed that is easy to reach. Do not use any sheets or blankets that are too big for your bed. They should not hang down  onto the floor. Have a firm chair that has side arms. You can use this for support while you get dressed. Do not have throw rugs and other things on the floor that can make you trip. What can I do in the kitchen? Clean up any spills right away. Avoid walking on wet floors. Keep items that you use a lot in easy-to-reach places. If you need to reach something above you, use a strong step stool that has a grab bar. Keep electrical cords out of the way. Do not use floor polish or wax that makes floors slippery. If you must use wax, use non-skid floor wax. Do not have throw rugs and other things on the floor that can make you trip. What can I do with my stairs? Do not leave any items on the stairs. Make sure that there are handrails on both sides of the stairs and use them. Fix handrails that are broken or loose. Make sure that handrails are as long as the stairways. Check any carpeting to make sure that it is firmly attached to the stairs. Fix any carpet that is loose or worn. Avoid having throw rugs at the top or bottom of the stairs. If you do have throw rugs, attach them to the floor with carpet tape. Make sure that you have a light switch at the top of the  stairs and the bottom of the stairs. If you do not have them, ask someone to add them for you. What else can I do to help prevent falls? Wear shoes that: Do not have high heels. Have rubber bottoms. Are comfortable and fit you well. Are closed at the toe. Do not wear sandals. If you use a stepladder: Make sure that it is fully opened. Do not climb a closed stepladder. Make sure that both sides of the stepladder are locked into place. Ask someone to hold it for you, if possible. Clearly mark and make sure that you can see: Any grab bars or handrails. First and last steps. Where the edge of each step is. Use tools that help you move around (mobility aids) if they are needed. These include: Canes. Walkers. Scooters. Crutches. Turn  on the lights when you go into a dark area. Replace any light bulbs as soon as they burn out. Set up your furniture so you have a clear path. Avoid moving your furniture around. If any of your floors are uneven, fix them. If there are any pets around you, be aware of where they are. Review your medicines with your doctor. Some medicines can make you feel dizzy. This can increase your chance of falling. Ask your doctor what other things that you can do to help prevent falls. This information is not intended to replace advice given to you by your health care provider. Make sure you discuss any questions you have with your health care provider. Document Released: 10/04/2009 Document Revised: 05/15/2016 Document Reviewed: 01/12/2015 Elsevier Interactive Patient Education  2017 [employer].

## 2022-08-14 NOTE — Progress Notes (Signed)
Carelink Summary Report / Loop Recorder 

## 2022-08-17 LAB — CUP PACEART REMOTE DEVICE CHECK
Date Time Interrogation Session: 20230817230344
Implantable Pulse Generator Implant Date: 20210525

## 2022-08-18 ENCOUNTER — Ambulatory Visit (INDEPENDENT_AMBULATORY_CARE_PROVIDER_SITE_OTHER): Payer: Medicare Other

## 2022-08-18 DIAGNOSIS — I639 Cerebral infarction, unspecified: Secondary | ICD-10-CM | POA: Diagnosis not present

## 2022-09-01 ENCOUNTER — Other Ambulatory Visit: Payer: Self-pay | Admitting: Cardiovascular Disease

## 2022-09-06 ENCOUNTER — Other Ambulatory Visit: Payer: Self-pay | Admitting: Family Medicine

## 2022-09-09 DIAGNOSIS — M48062 Spinal stenosis, lumbar region with neurogenic claudication: Secondary | ICD-10-CM | POA: Diagnosis not present

## 2022-09-10 NOTE — Progress Notes (Signed)
Carelink Summary Report / Loop Recorder 

## 2022-09-22 ENCOUNTER — Ambulatory Visit (INDEPENDENT_AMBULATORY_CARE_PROVIDER_SITE_OTHER): Payer: Medicare Other

## 2022-09-22 DIAGNOSIS — I639 Cerebral infarction, unspecified: Secondary | ICD-10-CM | POA: Diagnosis not present

## 2022-09-23 LAB — CUP PACEART REMOTE DEVICE CHECK
Date Time Interrogation Session: 20231001230613
Implantable Pulse Generator Implant Date: 20210525

## 2022-10-07 ENCOUNTER — Other Ambulatory Visit: Payer: Self-pay | Admitting: Neurosurgery

## 2022-10-07 DIAGNOSIS — M48062 Spinal stenosis, lumbar region with neurogenic claudication: Secondary | ICD-10-CM

## 2022-10-07 NOTE — Progress Notes (Signed)
Carelink Summary Report / Loop Recorder 

## 2022-10-16 ENCOUNTER — Telehealth: Payer: Self-pay

## 2022-10-16 NOTE — Progress Notes (Signed)
Chronic Care Management Pharmacy Assistant   Name: Nathan Fernandez  MRN: [id number] DOB: [birth year]  Reason for Encounter: CCM (Appointment Reminder)  Medications: Outpatient Encounter Medications as of 10/16/2022  Medication Sig Note   acetaminophen (TYLENOL) 325 MG tablet Take 1.5 tablets (487.5 mg total) by mouth 2 (two) times daily. (Patient taking differently: Take 650 mg by mouth 2 (two) times daily as needed for mild pain or headache.)    albuterol (VENTOLIN HFA) 108 (90 Base) MCG/ACT inhaler Inhale 1-2 puffs into the lungs every 6 (six) hours as needed (COPD). (Patient taking differently: Inhale 2 puffs into the lungs every 6 (six) hours as needed (COPD).)    Ascorbic Acid (VITAMIN C PO) Take 1 tablet by mouth daily.    aspirin 81 MG EC tablet Take 81 mg by mouth daily.    B Complex CAPS Take 1 capsule by mouth daily.    buPROPion (WELLBUTRIN SR) 150 MG 12 hr tablet TAKE 1 TABLET BY MOUTH EVERYDAY AT BEDTIME (Patient taking differently: Take 150 mg by mouth at bedtime.)    carvedilol (COREG) 6.25 MG tablet TAKE 1 TABLET BY MOUTH TWICE A DAY    Cholecalciferol (VITAMIN D-3 PO) Take 1 capsule by mouth daily.    cyclobenzaprine (FLEXERIL) 5 MG tablet Take 1 tablet (5 mg total) by mouth 3 (three) times daily as needed.    dorzolamide-timolol (COSOPT) 22.3-6.8 MG/ML ophthalmic solution Place 1 drop into both eyes 2 (two) times daily.    ELDERBERRY PO Take 1 capsule by mouth daily.    fenofibrate (TRICOR) 48 MG tablet TAKE 1 TABLET BY MOUTH EVERY DAY (Patient taking differently: Take 48 mg by mouth daily.)    gabapentin (NEURONTIN) 100 MG capsule Take 1 capsule (100 mg total) by mouth 2 (two) times daily. (Patient taking differently: Take 100 mg by mouth 2 (two) times daily as needed (nerve pain).)    hydrALAZINE (APRESOLINE) 10 MG tablet Take 1 tablet (10 mg total) by mouth every 12 (twelve) hours.    HYDROcodone-acetaminophen (NORCO/VICODIN) 5-325 MG tablet Take 1 tablet by mouth every  4 (four) hours as needed for moderate pain.    isosorbide mononitrate (IMDUR) 30 MG 24 hr tablet TAKE 1/2 TABLET BY MOUTH EVERY DAY    ketoconazole (NIZORAL) 2 % cream Apply 1 application topically daily as needed for irritation.    nitroGLYCERIN (NITROSTAT) 0.4 MG SL tablet Place 1 tablet (0.4 mg total) under the tongue every 5 (five) minutes as needed for chest pain. Up to 3 doses 07/28/2022: Pt keeps in pocket for when needed   oxyCODONE-acetaminophen (PERCOCET/ROXICET) 5-325 MG tablet Take 0.5-1 tablets by mouth every 8 (eight) hours as needed for severe pain. (Patient taking differently: Take 1 tablet by mouth every 8 (eight) hours as needed for severe pain.)    rivaroxaban (XARELTO) 20 MG TABS tablet Take 1 tablet (20 mg total) by mouth daily with supper.    rosuvastatin (CRESTOR) 20 MG tablet TAKE 1 TABLET BY MOUTH EVERY DAY (Patient taking differently: Take 20 mg by mouth daily.)    traZODone (DESYREL) 100 MG tablet TAKE 2 TABLETS BY MOUTH AT BEDTIME    No facility-administered encounter medications on file as of 10/16/2022.   Marthann Schiller was contacted to remind of upcoming telephone visit with Charlene Brooke on 10/22/2022 at 11:00. Patient was reminded to have any blood glucose and blood pressure readings available for review at appointment.   Message was left reminding patient of appointment.  CCM referral  has been placed prior to visit?  No   Star Rating Drugs: Medication:  Last Fill: Day Supply Rosuvastatin 20 mg 08/15/2022 [place], CPP notified  Marijean Niemann, [place] Pharmacy Assistant [phone]

## 2022-10-22 ENCOUNTER — Ambulatory Visit: Payer: Medicare Other | Admitting: Pharmacist

## 2022-10-22 DIAGNOSIS — Z86718 Personal history of other venous thrombosis and embolism: Secondary | ICD-10-CM

## 2022-10-22 DIAGNOSIS — E119 Type 2 diabetes mellitus without complications: Secondary | ICD-10-CM

## 2022-10-22 DIAGNOSIS — E7800 Pure hypercholesterolemia, unspecified: Secondary | ICD-10-CM

## 2022-10-22 DIAGNOSIS — I2510 Atherosclerotic heart disease of native coronary artery without angina pectoris: Secondary | ICD-10-CM

## 2022-10-22 DIAGNOSIS — I10 Essential (primary) hypertension: Secondary | ICD-10-CM

## 2022-10-22 NOTE — Progress Notes (Signed)
Chronic Care Management Pharmacy Note  10/22/2022 Name:  Nathan Fernandez MRN:  [id number] DOB:  1941/06/25  Summary: CCM F/U visit -Reviewed medications; pt affirms compliance as prescribed -HTN: BP has been elevated in recent OV (164/102 per neurosurgery clinic note 9/19); pt has not been checking BP at home; notes he has still been in pain after back surgery in August  Recommendations/Changes made from today's visit: -Advised pt to monitor BP daily for a few weeks; call back with home readings -Advised to schedule PCP annual visit in January  Plan: -[place] will call patient 2 weeks for BP update -Pharmacist follow up televisit PRN -PCP annual visit due Jan 2024    Subjective: Nathan Fernandez is an 81 y.o. year old male who is a primary patient of Damita Dunnings, Elveria Rising, MD.  The CCM team was consulted for assistance with disease management and care coordination needs.    Engaged with patient by telephone for follow up visit in response to provider referral for pharmacy case management and/or care coordination services.   Consent to Services:  The patient was given information about Chronic Care Management services, agreed to services, and gave verbal consent prior to initiation of services.  Please see initial visit note for detailed documentation.   Patient Care Team: Tonia Ghent, MD as PCP - General (Family Medicine) Josue Hector, MD as PCP - Cardiology (Cardiology) Josue Hector, MD as Consulting Physician (Cardiology) Charlton Haws, [facility] as Pharmacist (Pharmacist)  Recent office visits: 05/01/22 Dr Damita Dunnings OV: inguinal pain - refilled Xarelto 20 mg. Xray hip mild degenerative changes, encouraged to f/u with spine clinic.  01/20/2022 Elsie Stain, MD: HLD: Abnormal Labs "A1c is reasonable at 7.2.  Your hemoglobin is stable and your LDL is great.  Your triglycerides are only minimally elevated.  Your kidney function is normal and your liver tests are  fine.  Your uric acid test is reasonable at just above 7.  I would continue your medication as is". Stop: Oxycodone HCI 5 mg  Recent consult visits: 07/24/22 Dr Irish Lack (Cardiology): pre-op clearance  01/06/2022 Spencer Copland (Family Medicine): DVT: No med changes. F/U in 2 weeks.  11/11/2021 Thompson Grayer (Internal Medicine): Cerebral Infarction. No other information.   Hospital visits: 07/28/22 Admission - lumbar surgery  01/04/22 ED visit- acute DVT. Restart Xarelto   Objective:  Lab Results  Component Value Date   CREATININE 1.06 07/23/2022   BUN 14 07/23/2022   GFR 66.64 01/20/2022   GFRNONAA >60 07/23/2022   GFRAA 79 01/09/2021   NA 140 07/23/2022   K 4.1 07/23/2022   CALCIUM 9.1 07/23/2022   CO2 29 07/23/2022   GLUCOSE 127 (H) 07/23/2022    Lab Results  Component Value Date/Time   HGBA1C 7.0 (H) 07/23/2022 03:00 PM   HGBA1C 7.2 (H) 01/20/2022 11:29 AM   GFR 66.64 01/20/2022 11:29 AM   GFR 82.40 02/21/2021 04:42 PM   MICROALBUR 4.7 (H) 11/25/2010 09:12 AM   MICROALBUR 2.9 (H) 01/31/2008 10:39 AM    Last diabetic Eye exam: No results found for: "HMDIABEYEEXA"  Last diabetic Foot exam:  Lab Results  Component Value Date/Time   HMDIABFOOTEX Done 11/28/2010 12:00 AM     Lab Results  Component Value Date   CHOL 94 01/20/2022   HDL 35.10 (L) 01/20/2022   LDLCALC 24 01/20/2022   LDLDIRECT 64.0 08/31/2020   TRIG 174.0 (H) 01/20/2022   CHOLHDL 3 01/20/2022       Latest Ref  Rng & Units 01/20/2022   11:29 AM 02/14/2021    2:53 AM 02/13/2021    1:49 AM  Hepatic Function  Total Protein 6.0 - 8.3 g/dL 6.5  5.7  5.5   Albumin 3.5 - 5.2 g/dL 3.7  2.9  2.8   AST 0 - 37 U/L _0 ALT 0 - 53 U/L _1 Alk Phosphatase 39 - 117 U/L 37  47  41   Total Bilirubin 0.2 - 1.2 mg/dL 0.5  0.4  0.3     Lab Results  Component Value Date/Time   TSH 3.054 02/12/2021 06:22 AM   TSH 2.39 08/31/2018 11:56 AM   TSH 1.90 02/24/2013 08:26 AM       Latest Ref Rng &  Units 07/23/2022    3:00 PM 01/20/2022   11:29 AM 06/17/2021   10:41 AM  CBC  WBC 4.0 - 10.5 K/uL 7.4  6.8  7.2   Hemoglobin 13.0 - 17.0 g/dL 14.4  15.0  16.3   Hematocrit 39.0 - 52.0 % 42.1  44.9  47.5   Platelets 150 - 400 K/uL 202  202.0  168     Lab Results  Component Value Date/Time   VD25OH 49 11/25/2010 09:38 PM    Clinical ASCVD: Yes  The ASCVD Risk score (Arnett DK, et al., 2019) failed to calculate for the following reasons:   The 2019 ASCVD risk score is only valid for ages 46 to 43   The patient has a prior MI or stroke diagnosis       08/07/2022    1:11 PM 05/01/2022    3:43 PM 08/31/2020   11:28 AM  Depression screen PHQ 2/9  Decreased Interest 0 0 0  Down, Depressed, Hopeless 0 0 0  PHQ - 2 Score 0 0 0  Altered sleeping   0  Tired, decreased energy   0  Change in appetite   0  Feeling bad or failure about yourself    0  Trouble concentrating   0  Moving slowly or fidgety/restless   0  Suicidal thoughts   0  PHQ-9 Score   0  Difficult doing work/chores   Not difficult at all     Social History   Tobacco Use  Smoking Status Former   Types: Cigars  Smokeless Tobacco Never  Tobacco Comments   cigar occassionally   BP Readings from Last 3 Encounters:  07/29/22 (!) 140/86  07/24/22 (!) 160/100  07/23/22 (!) 159/94   Pulse Readings from Last 3 Encounters:  07/29/22 84  07/24/22 75  07/23/22 65   Wt Readings from Last 3 Encounters:  07/28/22 175 lb (79.4 kg)  07/24/22 176 lb 6.4 oz (80 kg)  07/23/22 175 lb 11.2 oz (79.7 kg)   BMI Readings from Last 3 Encounters:  07/28/22 25.84 kg/m  07/24/22 26.05 kg/m  07/23/22 25.95 kg/m    Assessment/Interventions: Review of patient past medical history, allergies, medications, health status, including review of consultants reports, laboratory and other test data, was performed as part of comprehensive evaluation and provision of chronic care management services.   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, music at [employer] from 08/31/2020 in [place] at [place] from 08/30/2019 in [place] at [place] Interventions Intervention Not Indicated Intervention Not Indicated -- -- -- --  Housing Interventions Intervention Not Indicated -- -- -- -- --  Transportation Interventions Intervention Not Indicated -- -- -- -- --  Depression Interventions/Treatment  -- -- -- -- PHQ2-9 Score <4 Follow-up Not Indicated PHQ2-9 Score <4 Follow-up Not Indicated  Financial Strain Interventions Intervention Not Indicated Intervention Not Indicated Other (Comment)  [Working on Xarelto assistance program application] --  [Apply for Xarelto patient assistance] -- --  Physical Activity Interventions Intervention Not Indicated -- -- -- -- --  Stress Interventions Intervention Not Indicated -- -- -- -- --  Social Connections Interventions Intervention Not Indicated -- -- -- -- --      SDOH Screenings   Food Insecurity: No Food Insecurity (08/07/2022)  Housing: Low Risk  (08/07/2022)  Transportation Needs: No Transportation Needs (08/07/2022)  Alcohol Screen: Low Risk  (08/07/2022)  Depression (PHQ2-9): Low Risk  (08/07/2022)  Financial Resource Strain: Low Risk  (08/07/2022)  Physical Activity: Inactive (08/07/2022)  Social Connections: Socially Integrated (08/07/2022)  Stress: No Stress Concern Present (08/07/2022)  Tobacco Use: Medium Risk (08/07/2022)    [place]  Allergies  Allergen Reactions   Ace Inhibitors Swelling    Swelling of the tongue    Angiotensin Receptor Blockers Swelling    Tongue swelling   Vasotec [Enalapril] Swelling    Tongue swelling   Pravachol [Pravastatin] Other (See Comments)    Myalgias     Medications Reviewed Today     Reviewed by Suszanne Finch, LPN (Licensed Practical Nurse) on 08/07/22 at [place] List Status: <None>   Medication Order Taking? Sig Documenting Provider Last Dose Status Informant  acetaminophen (TYLENOL) 325 MG tablet [id number] Yes Take 1.5 tablets (487.5 mg total) by mouth 2 (two) times daily.  Patient taking differently: Take 650 mg by mouth 2 (two) times daily as needed for mild pain or headache.   Kary Kos, MD Taking Active Spouse/Significant Other  albuterol (VENTOLIN HFA) 108 (90 Base) MCG/ACT inhaler [id number] Yes Inhale 1-2 puffs into the lungs every 6 (six) hours as needed (COPD).  Patient taking differently: Inhale 2 puffs into the lungs every 6 (six) hours as needed (COPD).   Tonia Ghent, MD Taking Active Spouse/Significant Other, Pharmacy Records  Ascorbic Acid (VITAMIN C PO) [id number] Yes Take 1 tablet by mouth daily. [provider] Taking Active Spouse/Significant Other  aspirin 81 MG EC tablet [id number] Yes Take 81 mg by mouth daily. [provider] Taking Active Spouse/Significant Other           Med Note Vella Raring Jan 06, 2022  2:11 PM)    B Complex CAPS [id number] Yes Take 1 capsule by mouth daily. [provider] Taking Active Spouse/Significant Other  buPROPion (WELLBUTRIN SR) 150 MG 12 hr tablet [id number] Yes TAKE 1 TABLET BY MOUTH EVERYDAY AT BEDTIME  Patient taking differently: Take 150 mg by mouth at bedtime.   Tonia Ghent, MD Taking Active Spouse/Significant Other, Pharmacy Records  carvedilol (COREG) 6.25 MG tablet [id number] Yes TAKE 1 TABLET BY MOUTH TWICE A DAY Tonia Ghent, MD Taking Active Spouse/Significant Other, Pharmacy Records  Cholecalciferol (VITAMIN D-3 PO) [id number] Yes Take 1 capsule by mouth  daily. [provider] Taking Active  Spouse/Significant Other  cyclobenzaprine (FLEXERIL) 5 MG tablet [id number] Yes Take 1 tablet (5 mg total) by mouth 3 (three) times daily as needed. Tonia Ghent, MD Taking Active Spouse/Significant Other, Pharmacy Records           Med Note [place], [place] R   Thu Jul 24, 2022  3:25 PM)    dorzolamide-timolol (COSOPT) 22.3-6.8 MG/ML ophthalmic solution [id number] Yes Place 1 drop into both eyes 2 (two) times daily. [provider] Taking Active Spouse/Significant Other, Pharmacy Records  ELDERBERRY PO [id number] Yes Take 1 capsule by mouth daily. [provider] Taking Active Spouse/Significant Other  fenofibrate (TRICOR) 48 MG tablet [id number] Yes TAKE 1 TABLET BY MOUTH EVERY DAY  Patient taking differently: Take 48 mg by mouth daily.   Tonia Ghent, MD Taking Active Spouse/Significant Other, Pharmacy Records  gabapentin (NEURONTIN) 100 MG capsule [id number] Yes Take 1 capsule (100 mg total) by mouth 2 (two) times daily.  Patient taking differently: Take 100 mg by mouth 2 (two) times daily as needed (nerve pain).   Kary Kos, MD Taking Active Spouse/Significant Other, Pharmacy Records  hydrALAZINE (APRESOLINE) 10 MG tablet [id number] Yes TAKE 1 TABLET EVERY 12 HOURS. NEEDS OV FOR MORE REFILLS Josue Hector, MD Taking Active   HYDROcodone-acetaminophen (NORCO/VICODIN) 5-325 MG tablet [id number] Yes Take 1 tablet by mouth every 4 (four) hours as needed for moderate pain. Kary Kos, MD Taking Active   isosorbide mononitrate (IMDUR) 30 MG 24 hr tablet [id number] Yes TAKE 1/2 TABLET BY MOUTH EVERY DAY Josue Hector, MD Taking Active   ketoconazole (NIZORAL) 2 % cream [id number] Yes Apply 1 application topically daily as needed for irritation. Tonia Ghent, MD Taking Active Spouse/Significant Other, Pharmacy Records  nitroGLYCERIN (NITROSTAT) 0.4 MG SL tablet [id number] Yes Place 1 tablet (0.4 mg total) under the tongue every 5  (five) minutes as needed for chest pain. Up to 3 doses Josue Hector, MD Taking Active Spouse/Significant Other, Pharmacy Records           Med Note (COFFELL, Dionne Bucy   Mon Jul 28, 2022  9:28 AM) Pt keeps in pocket for when needed  oxyCODONE-acetaminophen (PERCOCET/ROXICET) 5-325 MG tablet [id number] Yes Take 0.5-1 tablets by mouth every 8 (eight) hours as needed for severe pain.  Patient taking differently: Take 1 tablet by mouth every 8 (eight) hours as needed for severe pain.   Tonia Ghent, MD Taking Active Spouse/Significant Other, Pharmacy Records  rivaroxaban (XARELTO) 20 MG TABS tablet [id number] Yes Take 1 tablet (20 mg total) by mouth daily with supper. Tonia Ghent, MD Taking Active Spouse/Significant Other, Pharmacy Records  rosuvastatin (CRESTOR) 20 MG tablet [id number] Yes TAKE 1 TABLET BY MOUTH EVERY DAY  Patient taking differently: Take 20 mg by mouth daily.   Tonia Ghent, MD Taking Active Spouse/Significant Other, Pharmacy Records  traZODone (DESYREL) 100 MG tablet [id number] Yes TAKE 2 TABLETS BY MOUTH AT BEDTIME  Patient taking differently: Take 200 mg by mouth at bedtime as needed for sleep.   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   Hemoptysis 07/10/2019   Abnormal cardiovascular stress test    Tremor 09/02/2018   Advance care planning 09/02/2018   Cold intolerance 09/02/2018   Dry mouth 03/12/2018   Shoulder pain 01/05/2018    Hypoxia 12/29/2017   Wrist pain 12/29/2017   Lower back pain 02/18/2017   Lower urinary tract symptoms (LUTS) 02/11/2016   Bilateral knee pain 05/25/2015   Benign paroxysmal positional vertigo 02/18/2015   Abdominal wall symptom 02/18/2015   Gout 05/04/2014   Healthcare maintenance 04/18/2014   COPD (chronic obstructive pulmonary disease) ([place]) 03/25/2013   CAD (coronary artery disease) 03/03/2013   Other malaise and fatigue 02/22/2013   Chest wall pain 05/31/2012   Heartburn 05/07/2011   Depression 11/28/2010   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   NICOTINE ADDICTION 06/04/2010   Diabetes mellitus ([place]) 03/18/2010   SCIATICA, 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   HYPERCHOLESTEROLEMIA 10/19/2007   ERECTILE DYSFUNCTION 10/19/2007   HYPERTENSION, BENIGN 10/19/2007   ROSACEA 10/19/2007   Osteoarthritis 10/19/2007   Insomnia 10/19/2007    Immunization History  Administered Date(s) Administered   Influenza Split 01/07/2012   Influenza,inj,Quad PF,6+ Mos 02/08/2016   PFIZER(Purple Top)SARS-COV-2 Vaccination 02/13/2020, 03/05/2020   Pneumococcal Conjugate-13 02/08/2016   Pneumococcal Polysaccharide-23 09/07/2020   Td 09/01/2001    Conditions to be addressed/monitored:  Hypertension, Hyperlipidemia, Diabetes, and Coronary Artery Disease, Recurrent DVT  Care Plan : [place]  Updates made by Charlton Haws, [place] since 10/22/2022 12:00 AM     Problem: Hypertension, Hyperlipidemia, Diabetes, and Coronary Artery Disease, Recurrent DVT   Priority: High     Long-Range Goal: Disease mgmt   Start Date: 04/15/2021  Expected End Date: 04/24/2023  This Visit's Progress: On track  Recent Progress: On track  Priority: High  Note:   Current Barriers:  Elevated BP readings   Pharmacist Clinical Goal(s):  Patient will achieve adherence to monitoring guidelines and medication adherence to achieve therapeutic efficacy through collaboration with PharmD and  provider.   Interventions: 1:1 collaboration with Tonia Ghent, MD regarding development and update of comprehensive plan of care as evidenced by provider attestation and co-signature Inter-disciplinary care team collaboration (see longitudinal plan of care) Comprehensive medication review performed; medication list updated in electronic medical record  Hypertension/CAD (BP goal <140/90) -Uncontrolled - per recent OV, BP was 164/102 (9/19); pt has not been monitoring at home -Current home readings: none available -Current treatment: Carvedilol 6.25 mg 1/2 tab BID - Appropriate, Query Effective Hydralazine 10 mg BID - Appropriate, Query Effective Isosorbide mononitrate 30 mg 1/2 tab daily - Appropriate, Query Effective -Medications previously tried: ACE/ARB allergy  -Educated on BP goals and benefits of medications for prevention of heart attack, stroke and kidney damage; Limit salty six  -Recommend monitor BP at home daily for 2-3 weeks; f/u with CCM team -Recommended to continue current medication  Hyperlipidemia/CAD: (LDL goal < 100) -Controlled - LDL 24 (12/2021), TRIG 174 at goal;  -Current treatment: Rosuvastatin 20 mg daily - Appropriate, Effective, Safe, Accessible Fenofibrate 48 mg daily -Appropriate, Effective, Safe, Accessible Aspirin 81 mg daily -Appropriate, Effective, Safe, Accessible -Medications previously tried: pt confirmed off pravastatin and gemfibrozil   -Recommended to continue current medication  Diabetes (A1c goal <8%) -Diet - Controlled - A1c 7.0% (07/2022) -Denies hypoglycemic/hyperglycemic symptoms -Current medications: None -Medications previously tried: metformin (2010-2013), Farxiga -Educated on A1c and blood sugar goals;  COPD (Goal: control symptoms and prevent exacerbations) -Controlled - per patient report -Pulmonary function testing: none per chart -Exacerbations requiring treatment in last 6 months: none -Current treatment  Albuterol HFA  prn- Appropriate, Effective, Safe, Accessible -Medications previously tried: none -Frequency of rescue inhaler use: once/week - increased lately due to allergies -Recommended to continue current medication  Pain (Goal: Control symptoms) -Not ideally controlled -pt had back surgery 07/23/22 and reports back pain initially improved but now has migrated, he follows up with neurosurgery regularly and managed with pain medication as below -Non-pharm: Uses heat -Current treatment  Tylenol Arthritis 650 mg- 2 tab AM - Appropriate, Query Effective Cyclobenzaprine 5 mg TID PRN - Appropriate, Query Effective Gabapentin 100 mg BID PRN - Appropriate, Query Effective Hydrocodone-APAP 5-325 PRN (#45 - 08/16/22) Oxycodone-APAP 5-325 mg PRN (#45 - 10/07/22) -Medications previously tried: none, did not try lidocaine patch -Recommend to continue current medication  Recurrent DVT (goal: prevent recurrence) -Controlled - pt endorses compliance with Xarelto, denies s/sx of bleeding -Hx splenic infarct 2021; Acute DVT 12/2021. F/u Dr Irene Limbo regarding recurrent VTE. Indefinite anticoagulation recommended. -Current treatment  Xarelto 20 mg daily - Appropriate, Effective, Safe, Accessible -Reviewed indication for Xarelto and importance of daily compliance -Recommended to continue current medication  Patient Goals/Self-Care Activities Patient will:  - take medications as prescribed as evidenced by patient report and record review focus on medication adherence by routine      Medication Assistance: None required.  Patient affirms current coverage meets needs.  Compliance/Adherence/Medication fill history: Care Gaps: Foot exam (due 09/07/21) Eye exam (due 08/17/21) Urine MA (due 11/26/11)  Star-Rating Drugs: Farxiga - PDC 0%; LF 06/06/21 x 30 ds  Medication Access: Within the past 30 days, how often has patient missed a dose of medication? 0 Is a pillbox or other method used to improve adherence? Yes   Factors that may affect medication adherence? no barriers identified Are meds synced by current pharmacy? No  Are meds delivered by current pharmacy? No  Does patient experience delays in picking up medications due to transportation concerns? No   [facility] Services Reviewed: Is patient disadvantaged to use [facility]?: No  Current Rx insurance plan: [facility] Name and location of Current pharmacy:  [id number]: [phone]: [phone] [facility] services reviewed with patient today?: No  Patient requests to transfer care to [facility]?: No  Reason patient declined to change pharmacies: Not mentioned at this visit   Care Plan and Follow Up Patient Decision:  Patient agrees to Care Plan and Follow-up.  Plan: Telephone follow up appointment with care management team member scheduled for:  PRN  LCharlene Brooke PharmD, BCACP Clinical Pharmacist [phone]

## 2022-10-22 NOTE — Patient Instructions (Signed)
Visit Information  Phone number for Pharmacist: [phone]   Goals Addressed   None     Care Plan : [place]  Updates made by Charlton Haws, RPH since 10/22/2022 12:00 AM     Problem: Hypertension, Hyperlipidemia, Diabetes, and Coronary Artery Disease, Recurrent DVT   Priority: High     Long-Range Goal: Disease mgmt   Start Date: 04/15/2021  Expected End Date: 04/24/2023  This Visit's Progress: On track  Recent Progress: On track  Priority: High  Note:   Current Barriers:  Elevated BP readings   Pharmacist Clinical Goal(s):  Patient will achieve adherence to monitoring guidelines and medication adherence to achieve therapeutic efficacy through collaboration with PharmD and provider.   Interventions: 1:1 collaboration with Tonia Ghent, MD regarding development and update of comprehensive plan of care as evidenced by provider attestation and co-signature Inter-disciplinary care team collaboration (see longitudinal plan of care) Comprehensive medication review performed; medication list updated in electronic medical record  Hypertension/CAD (BP goal <140/90) -Uncontrolled - per recent OV, BP was 164/102 (9/19); pt has not been monitoring at home -Current home readings: none available -Current treatment: Carvedilol 6.25 mg 1/2 tab BID - Appropriate, Query Effective Hydralazine 10 mg BID - Appropriate, Query Effective Isosorbide mononitrate 30 mg 1/2 tab daily - Appropriate, Query Effective -Medications previously tried: ACE/ARB allergy  -Educated on BP goals and benefits of medications for prevention of heart attack, stroke and kidney damage; Limit salty six  -Recommend monitor BP at home daily for 2-3 weeks; f/u with CCM team -Recommended to continue current medication  Hyperlipidemia/CAD: (LDL goal < 100) -Controlled - LDL 24 (12/2021), TRIG 174 at goal;  -Current treatment: Rosuvastatin 20 mg daily - Appropriate, Effective, Safe,  Accessible Fenofibrate 48 mg daily -Appropriate, Effective, Safe, Accessible Aspirin 81 mg daily -Appropriate, Effective, Safe, Accessible -Medications previously tried: pt confirmed off pravastatin and gemfibrozil   -Recommended to continue current medication  Diabetes (A1c goal <8%) -Diet - Controlled - A1c 7.0% (07/2022) -Denies hypoglycemic/hyperglycemic symptoms -Current medications: None -Medications previously tried: metformin (2010-2013), Farxiga -Educated on A1c and blood sugar goals;  COPD (Goal: control symptoms and prevent exacerbations) -Controlled - per patient report -Pulmonary function testing: none per chart -Exacerbations requiring treatment in last 6 months: none -Current treatment  Albuterol HFA prn- Appropriate, Effective, Safe, Accessible -Medications previously tried: none -Frequency of rescue inhaler use: once/week - increased lately due to allergies -Recommended to continue current medication  Pain (Goal: Control symptoms) -Not ideally controlled -pt had back surgery 07/23/22 and reports back pain initially improved but now has migrated, he follows up with neurosurgery regularly and managed with pain medication as below -Non-pharm: Uses heat -Current treatment  Tylenol Arthritis 650 mg- 2 tab AM - Appropriate, Query Effective Cyclobenzaprine 5 mg TID PRN - Appropriate, Query Effective Gabapentin 100 mg BID PRN - Appropriate, Query Effective Hydrocodone-APAP 5-325 PRN (#45 - 08/16/22) Oxycodone-APAP 5-325 mg PRN (#45 - 10/07/22) -Medications previously tried: none, did not try lidocaine patch -Recommend to continue current medication  Recurrent DVT (goal: prevent recurrence) -Controlled - pt endorses compliance with Xarelto, denies s/sx of bleeding -Hx splenic infarct 2021; Acute DVT 12/2021. F/u Dr Irene Limbo regarding recurrent VTE. Indefinite anticoagulation recommended. -Current treatment  Xarelto 20 mg daily - Appropriate, Effective, Safe,  Accessible -Reviewed indication for Xarelto and importance of daily compliance -Recommended to continue current medication  Patient Goals/Self-Care Activities Patient will:  - take medications as prescribed as evidenced by patient report and record review focus  on medication adherence by routine      Patient verbalizes understanding of instructions and care plan provided today and agrees to view in [place]. Active MyChart status and patient understanding of how to access instructions and care plan via MyChart confirmed with patient.    Telephone follow up appointment with pharmacy team member scheduled for: PRN  Charlene Brooke, PharmD, BCACP Clinical Pharmacist [facility] [phone]

## 2022-10-27 ENCOUNTER — Ambulatory Visit (INDEPENDENT_AMBULATORY_CARE_PROVIDER_SITE_OTHER): Payer: Medicare Other

## 2022-10-27 DIAGNOSIS — I639 Cerebral infarction, unspecified: Secondary | ICD-10-CM

## 2022-10-28 LAB — CUP PACEART REMOTE DEVICE CHECK
Date Time Interrogation Session: 20231105230639
Implantable Pulse Generator Implant Date: 20210525

## 2022-11-03 ENCOUNTER — Ambulatory Visit
Admission: RE | Admit: 2022-11-03 | Discharge: 2022-11-03 | Disposition: A | Discharge status: Home / Self Care | Payer: Medicare Other | Source: Ambulatory Visit | Attending: Neurosurgery | Admitting: Neurosurgery

## 2022-11-03 DIAGNOSIS — M48062 Spinal stenosis, lumbar region with neurogenic claudication: Secondary | ICD-10-CM

## 2022-11-03 DIAGNOSIS — M5127 Other intervertebral disc displacement, lumbosacral region: Secondary | ICD-10-CM | POA: Diagnosis not present

## 2022-11-03 DIAGNOSIS — M47817 Spondylosis without myelopathy or radiculopathy, lumbosacral region: Secondary | ICD-10-CM | POA: Diagnosis not present

## 2022-11-03 MED ORDER — GADOPICLENOL 0.5 MMOL/ML IV SOLN
7.5000 mL | Freq: Once | INTRAVENOUS | Status: AC | PRN
  Administered 2022-11-03: 7.5 mL via INTRAVENOUS

## 2022-11-03 MED ORDER — GADOPICLENOL 0.5 MMOL/ML IV SOLN: 7.5000 mL | Freq: Once | INTRAVENOUS | Status: DC | PRN

## 2022-11-06 ENCOUNTER — Telehealth: Payer: Self-pay

## 2022-11-06 NOTE — Progress Notes (Signed)
    Chronic Care Management Pharmacy Assistant   Name: KRAMER HANRAHAN  MRN: [id number] DOB: January 16, 1941  Reason for Encounter: CCM (Hypertension Disease State)  Called patient for updated blood pressure readings. No answer; left message.  Charlene Brooke, CPP notified  Marijean Niemann, [place] Clinical Pharmacy Assistant [phone]

## 2022-11-27 NOTE — Progress Notes (Signed)
Carelink Summary Report / Loop Recorder 

## 2022-11-28 ENCOUNTER — Other Ambulatory Visit: Payer: Self-pay | Admitting: Family Medicine

## 2022-12-01 ENCOUNTER — Ambulatory Visit (INDEPENDENT_AMBULATORY_CARE_PROVIDER_SITE_OTHER): Payer: Medicare Other

## 2022-12-01 DIAGNOSIS — I639 Cerebral infarction, unspecified: Secondary | ICD-10-CM

## 2022-12-02 LAB — CUP PACEART REMOTE DEVICE CHECK
Date Time Interrogation Session: 20231210230250
Implantable Pulse Generator Implant Date: 20210525

## 2022-12-04 DIAGNOSIS — M48062 Spinal stenosis, lumbar region with neurogenic claudication: Secondary | ICD-10-CM | POA: Diagnosis not present

## 2022-12-25 NOTE — Progress Notes (Signed)
[facility] Quality Team Note  Name: Nathan Fernandez Date of Birth: 12/22/1941 MRN: [id number] Date: 12/25/2022  [facility] Quality Team has reviewed this patient's chart, please see recommendations below:  [facility] Quality Other; Pt has open quality gap for KED, needs Micro/Creat Urine test and eGFR blood test to close this. Please address at next visit.

## 2023-01-05 ENCOUNTER — Ambulatory Visit (INDEPENDENT_AMBULATORY_CARE_PROVIDER_SITE_OTHER): Payer: Medicare Other

## 2023-01-05 DIAGNOSIS — I639 Cerebral infarction, unspecified: Secondary | ICD-10-CM

## 2023-01-06 LAB — CUP PACEART REMOTE DEVICE CHECK
Date Time Interrogation Session: 20240112230629
Implantable Pulse Generator Implant Date: 20210525

## 2023-01-08 NOTE — Progress Notes (Signed)
Carelink Summary Report / Loop Recorder

## 2023-01-16 ENCOUNTER — Other Ambulatory Visit: Payer: Self-pay | Admitting: Family Medicine

## 2023-01-20 DIAGNOSIS — L820 Inflamed seborrheic keratosis: Secondary | ICD-10-CM | POA: Diagnosis not present

## 2023-01-20 DIAGNOSIS — L821 Other seborrheic keratosis: Secondary | ICD-10-CM | POA: Diagnosis not present

## 2023-01-20 DIAGNOSIS — C44311 Basal cell carcinoma of skin of nose: Secondary | ICD-10-CM | POA: Diagnosis not present

## 2023-01-20 DIAGNOSIS — C44319 Basal cell carcinoma of skin of other parts of face: Secondary | ICD-10-CM | POA: Diagnosis not present

## 2023-01-20 DIAGNOSIS — L814 Other melanin hyperpigmentation: Secondary | ICD-10-CM | POA: Diagnosis not present

## 2023-01-20 DIAGNOSIS — Z85828 Personal history of other malignant neoplasm of skin: Secondary | ICD-10-CM | POA: Diagnosis not present

## 2023-01-20 DIAGNOSIS — D225 Melanocytic nevi of trunk: Secondary | ICD-10-CM | POA: Diagnosis not present

## 2023-02-08 LAB — CUP PACEART REMOTE DEVICE CHECK
Date Time Interrogation Session: 20240214231213
Implantable Pulse Generator Implant Date: 20210525

## 2023-02-09 ENCOUNTER — Ambulatory Visit: Payer: Medicare Other

## 2023-02-09 DIAGNOSIS — I639 Cerebral infarction, unspecified: Secondary | ICD-10-CM

## 2023-02-13 ENCOUNTER — Other Ambulatory Visit: Payer: Self-pay | Admitting: Family Medicine

## 2023-02-16 NOTE — Progress Notes (Signed)
Carelink Summary Report / Loop Recorder 

## 2023-03-02 ENCOUNTER — Other Ambulatory Visit: Payer: Self-pay | Admitting: Family Medicine

## 2023-03-03 NOTE — Telephone Encounter (Signed)
Patient is due for f/u. Please call patient to schedule.

## 2023-03-03 NOTE — Telephone Encounter (Signed)
Lvm for patient tcb and schedule 

## 2023-03-04 NOTE — Telephone Encounter (Signed)
Spoke to pt's wife, scheduled f/u for 03/09/23

## 2023-03-05 DIAGNOSIS — M48062 Spinal stenosis, lumbar region with neurogenic claudication: Secondary | ICD-10-CM | POA: Diagnosis not present

## 2023-03-09 ENCOUNTER — Encounter: Payer: Self-pay | Admitting: Family Medicine

## 2023-03-09 ENCOUNTER — Ambulatory Visit (INDEPENDENT_AMBULATORY_CARE_PROVIDER_SITE_OTHER): Payer: Medicare Other | Admitting: Family Medicine

## 2023-03-09 VITALS — BP 128/78 | HR 72 | Temp 97.3°F | Ht 69.0 in | Wt 168.0 lb

## 2023-03-09 DIAGNOSIS — M545 Low back pain, unspecified: Secondary | ICD-10-CM | POA: Diagnosis not present

## 2023-03-09 DIAGNOSIS — N478 Other disorders of prepuce: Secondary | ICD-10-CM

## 2023-03-09 DIAGNOSIS — E119 Type 2 diabetes mellitus without complications: Secondary | ICD-10-CM | POA: Diagnosis not present

## 2023-03-09 DIAGNOSIS — I10 Essential (primary) hypertension: Secondary | ICD-10-CM

## 2023-03-09 LAB — MICROALBUMIN / CREATININE URINE RATIO
Creatinine,U: 101.8 mg/dL
Microalb Creat Ratio: 86.8 mg/g — ABNORMAL HIGH (ref 0.0–30.0)
Microalb, Ur: 88.4 mg/dL — ABNORMAL HIGH (ref 0.0–1.9)

## 2023-03-09 LAB — COMPREHENSIVE METABOLIC PANEL
ALT: 13 U/L (ref 0–53)
AST: 20 U/L (ref 0–37)
Albumin: 3.3 g/dL — ABNORMAL LOW (ref 3.5–5.2)
Alkaline Phosphatase: 34 U/L — ABNORMAL LOW (ref 39–117)
BUN: 17 mg/dL (ref 6–23)
CO2: 28 mEq/L (ref 19–32)
Calcium: 8.4 mg/dL (ref 8.4–10.5)
Chloride: 105 mEq/L (ref 96–112)
Creatinine, Ser: 1.09 mg/dL (ref 0.40–1.50)
GFR: 63.21 mL/min (ref >60.00)
Glucose, Bld: 136 mg/dL — ABNORMAL HIGH (ref 70–99)
Potassium: 4.3 mEq/L (ref 3.5–5.1)
Sodium: 138 mEq/L (ref 135–145)
Total Bilirubin: 0.3 mg/dL (ref 0.2–1.2)
Total Protein: 6 g/dL (ref 6.0–8.3)

## 2023-03-09 LAB — LIPID PANEL
Cholesterol: 112 mg/dL (ref 0–200)
HDL: 42.5 mg/dL (ref >39.00)
LDL Cholesterol: 48 mg/dL (ref 0–99)
NonHDL: 69.44
Total CHOL/HDL Ratio: 3
Triglycerides: 109 mg/dL (ref 0.0–149.0)
VLDL: 21.8 mg/dL (ref 0.0–40.0)

## 2023-03-09 LAB — CBC WITH DIFFERENTIAL/PLATELET
Basophils Absolute: 0.1 10*3/uL (ref 0.0–0.1)
Basophils Relative: 1 % (ref 0.0–3.0)
Eosinophils Absolute: 0.3 10*3/uL (ref 0.0–0.7)
Eosinophils Relative: 4.5 % (ref 0.0–5.0)
HCT: 43.2 % (ref 39.0–52.0)
Hemoglobin: 14.5 g/dL (ref 13.0–17.0)
Lymphocytes Relative: 32.6 % (ref 12.0–46.0)
Lymphs Abs: 1.8 10*3/uL (ref 0.7–4.0)
MCHC: 33.6 g/dL (ref 30.0–36.0)
MCV: 94 fl (ref 78.0–100.0)
Monocytes Absolute: 0.6 10*3/uL (ref 0.1–1.0)
Monocytes Relative: 11.1 % (ref 3.0–12.0)
Neutro Abs: 2.9 10*3/uL (ref 1.4–7.7)
Neutrophils Relative %: 50.8 % (ref 43.0–77.0)
Platelets: 192 10*3/uL (ref 150.0–400.0)
RBC: 4.6 Mil/uL (ref 4.22–5.81)
RDW: 13.5 % (ref 11.5–15.5)
WBC: 5.6 10*3/uL (ref 4.0–10.5)

## 2023-03-09 LAB — TSH: TSH: 2.78 u[IU]/mL (ref 0.35–5.50)

## 2023-03-09 LAB — URIC ACID: Uric Acid, Serum: 7.2 mg/dL (ref 4.0–7.8)

## 2023-03-09 LAB — HEMOGLOBIN A1C: Hgb A1c MFr Bld: 7.3 % — ABNORMAL HIGH (ref 4.6–6.5)

## 2023-03-09 MED ORDER — GABAPENTIN 100 MG PO CAPS: ORAL_CAPSULE | ORAL | Status: DC

## 2023-03-09 NOTE — Patient Instructions (Addendum)
Make sure the back clinic knows about your xarelto use.   Let me know how it goes with the injection tomorrow.  Go to the lab on the way out.   If you have mychart we'll likely use that to update you.    If your foreskin gets stuck/painful, then go to the ER. Take care.  Glad to see you.

## 2023-03-09 NOTE — Progress Notes (Signed)
Diabetes:  No meds.   Hypoglycemic episodes: no sx Hyperglycemic episodes: no sx Blood Sugars averaging: not checked.  Weight down with diet restriction.  He had cut back on intake from prior baseline.  He doesn't have "B" sx.    Foreskin is tighter than prev.  No pain.  No discharge.  He is still able to retract and then extend the foreskin.  See exam.  Hypertension:    D/w pt about episodic chest pain, it can happen at rest.  No clearly with exertion.  Random onset, very brief, less than 1 second per patient report.  This has been stable for the last year.   Chest pain with exertion:no Edema:no Short of breath: at baseline, with sig exertion.   Labs pending.   No recent SABA use.    He is putting up with hip and back pain. He is injections pending. D/w pt about anatomy.  He is frustrated about his level of pain, which is understandable.  Meds, vitals, and allergies reviewed.   ROS: Per HPI unless specifically indicated in ROS section   GEN: nad, alert and oriented HEENT: mucous membranes moist NECK: supple w/o LA CV: rrr. PULM: ctab, no inc wob ABD: soft, +bs EXT: no edema SKIN: no acute rash Foreskin appears to have some tightening but still retracts and returns to normal position.    30 minutes were devoted to patient care in this encounter (this includes time spent reviewing the patient's file/history, interviewing and examining the patient, counseling/reviewing plan with patient).

## 2023-03-10 DIAGNOSIS — M47816 Spondylosis without myelopathy or radiculopathy, lumbar region: Secondary | ICD-10-CM | POA: Diagnosis not present

## 2023-03-11 DIAGNOSIS — N478 Other disorders of prepuce: Secondary | ICD-10-CM | POA: Insufficient documentation

## 2023-03-11 NOTE — Assessment & Plan Note (Signed)
He has some tightening but he still able to retract and extend the foreskin.  Routine cautions given to patient.  I think it makes sense to address his other issues currently.

## 2023-03-11 NOTE — Assessment & Plan Note (Signed)
Continue isosorbide hydralazine carvedilol as is.  See notes on labs.

## 2023-03-11 NOTE — Assessment & Plan Note (Signed)
He is going to follow-up for injection to see if that will help with his pain.  Rationale discussed with patient.

## 2023-03-11 NOTE — Assessment & Plan Note (Signed)
No meds currently.  See notes on labs.

## 2023-03-16 ENCOUNTER — Ambulatory Visit (INDEPENDENT_AMBULATORY_CARE_PROVIDER_SITE_OTHER): Payer: Medicare Other

## 2023-03-16 DIAGNOSIS — I639 Cerebral infarction, unspecified: Secondary | ICD-10-CM | POA: Diagnosis not present

## 2023-03-16 LAB — CUP PACEART REMOTE DEVICE CHECK
Date Time Interrogation Session: 20240324232512
Implantable Pulse Generator Implant Date: 20210525

## 2023-03-19 ENCOUNTER — Other Ambulatory Visit: Payer: Self-pay | Admitting: Family Medicine

## 2023-03-23 NOTE — Progress Notes (Signed)
Carelink Summary Report / Loop Recorder 

## 2023-03-31 DIAGNOSIS — M5440 Lumbago with sciatica, unspecified side: Secondary | ICD-10-CM | POA: Diagnosis not present

## 2023-03-31 DIAGNOSIS — M48062 Spinal stenosis, lumbar region with neurogenic claudication: Secondary | ICD-10-CM | POA: Diagnosis not present

## 2023-04-11 IMAGING — MR MR LUMBAR SPINE W/O CM
5 series · 31 of 48 positions shown · non-contrast
Comparison: None.

CLINICAL DATA: Low back pain radiating to the left lower extremity

EXAM:
MRI LUMBAR SPINE WITHOUT CONTRAST
TECHNIQUE: Multiplanar, multisequence MR imaging of the lumbar spine was
performed. No intravenous contrast was administered.

[Series 5: T1 · sagittal · 4.0mm · 0.84mm/px · 6 of 17 slices shown (1 of 2)]
[im 1/17]
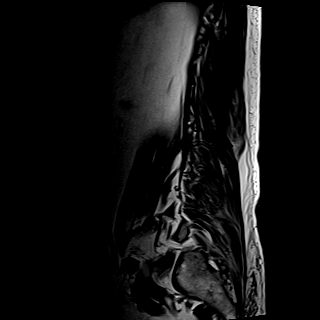
[im 4/17]
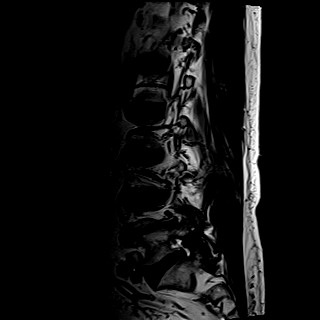
[im 7/17]
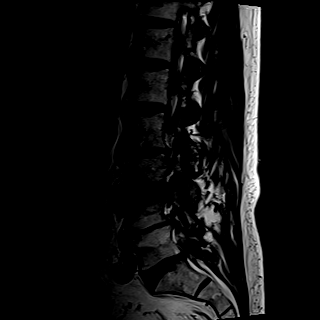
[im 10/17]
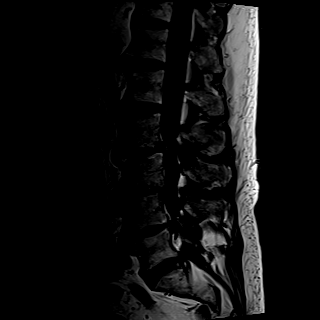
[im 13/17]
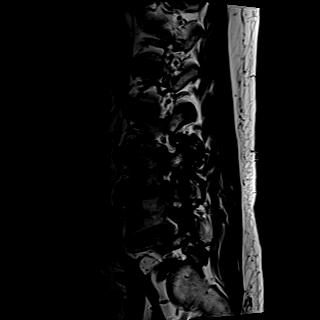
[im 17/17]
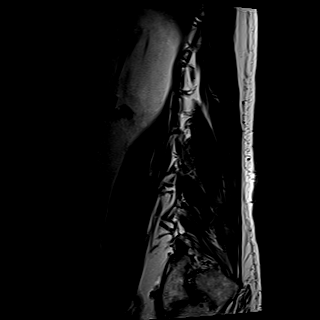

[Series 6: STIR · sagittal · 4.0mm · 0.53mm/px · 1 of 17 slices shown]
[im 1/17]
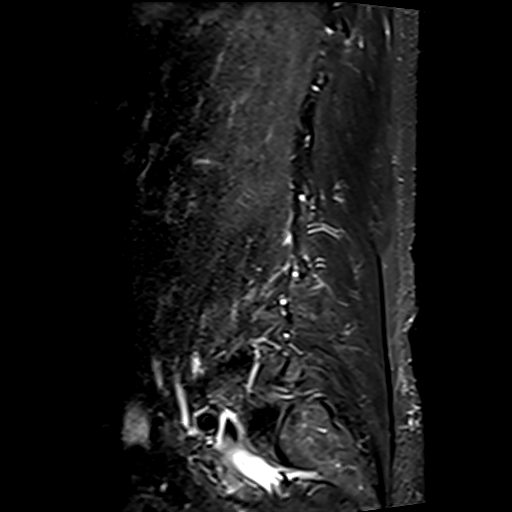

[Series 7: T2 · axial · 4.0mm · 0.62mm/px · z∈[-110,+141]mm · 9 of 43 slices shown (1 of 2)]
[im 1/43]
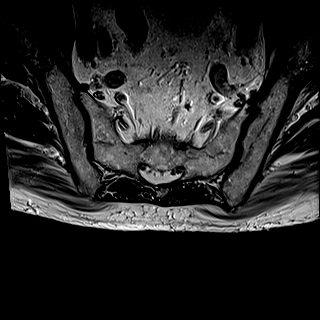
[im 7/43]
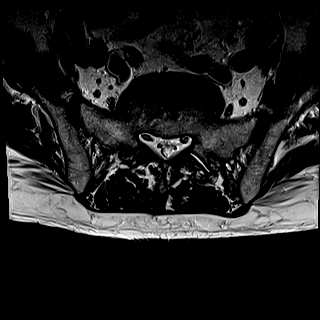
[im 13/43]
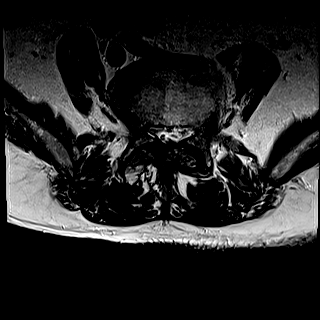
[im 19/43]
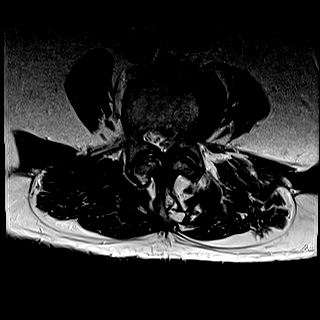
[im 22/43]
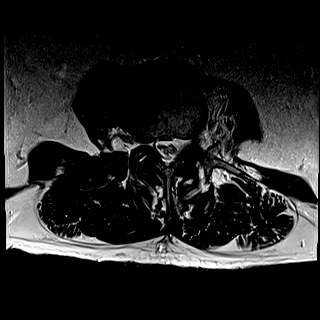
[im 25/43]
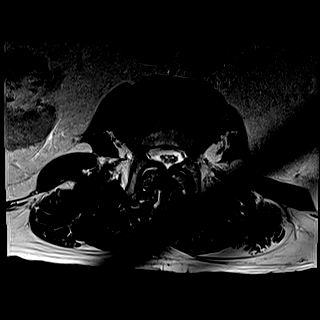
[im 31/43]
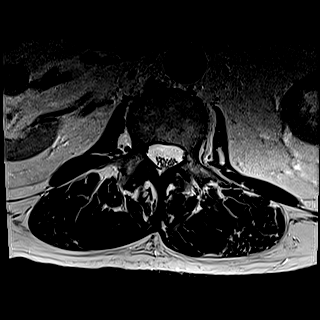
[im 37/43]
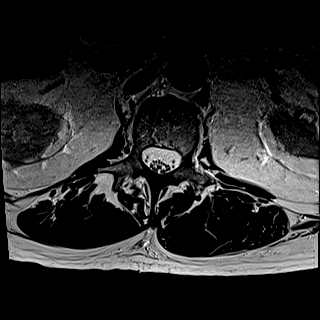
[im 43/43]
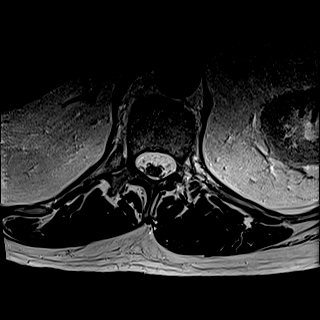

[Series 8: T1 · axial · 4.0mm · 0.39mm/px · z∈[-110,+141]mm · 9 of 43 slices shown (2 of 2)]
[im 1/43]
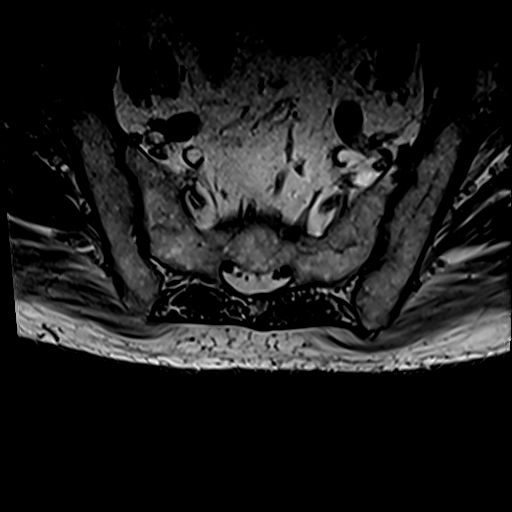
[im 7/43]
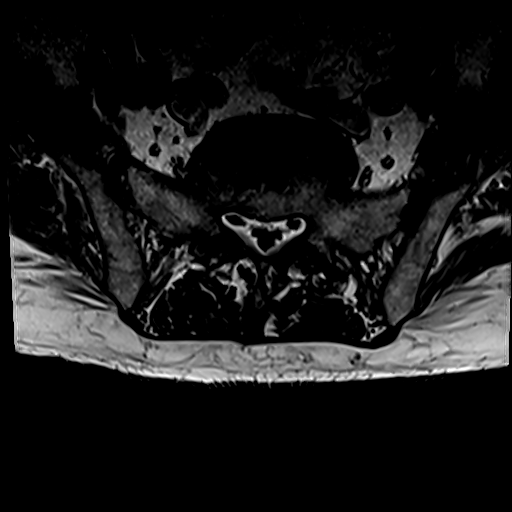
[im 13/43]
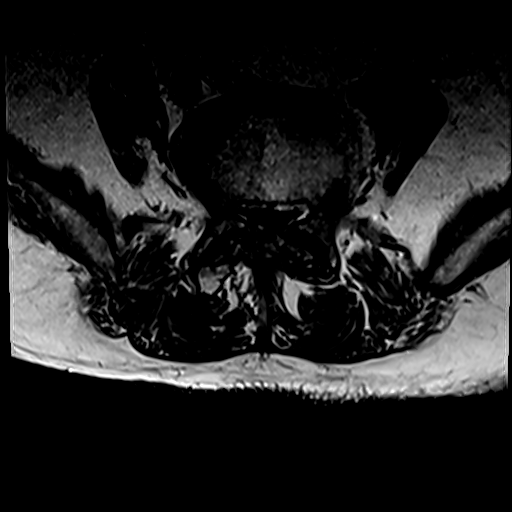
[im 19/43]
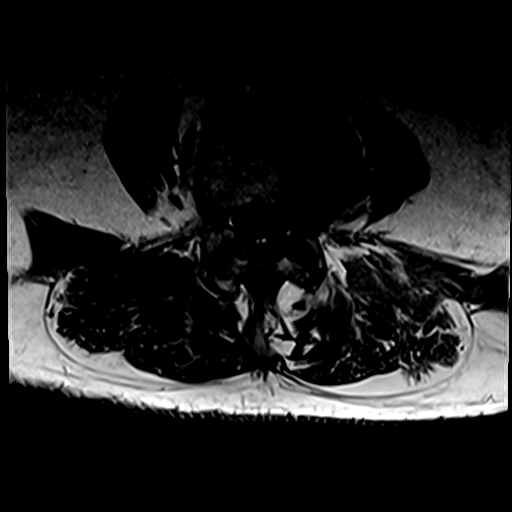
[im 22/43]
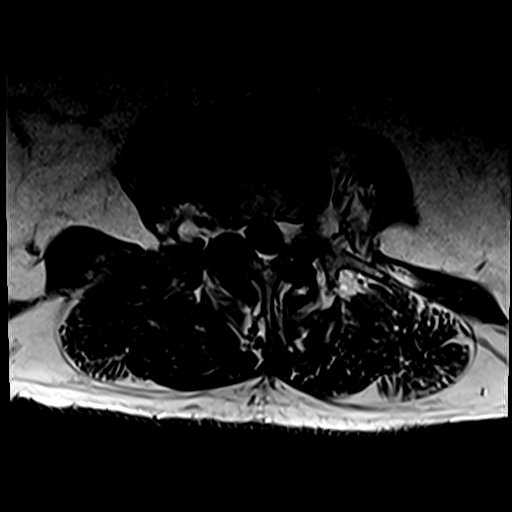
[im 25/43]
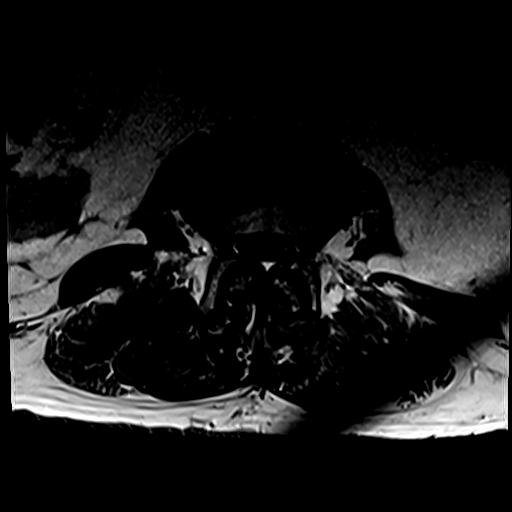
[im 31/43]
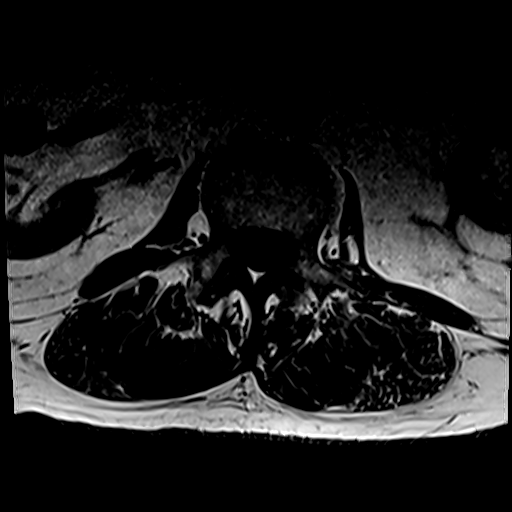
[im 37/43]
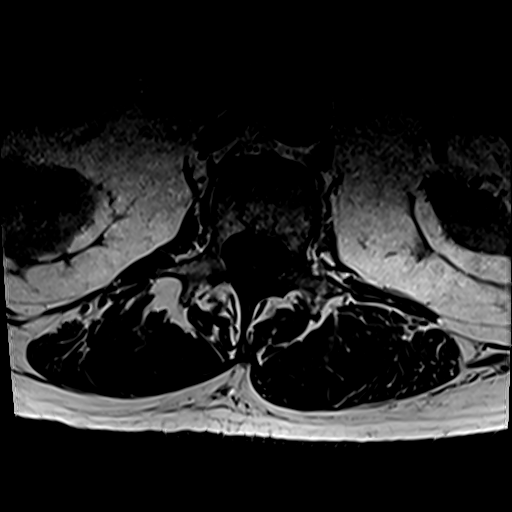
[im 43/43]
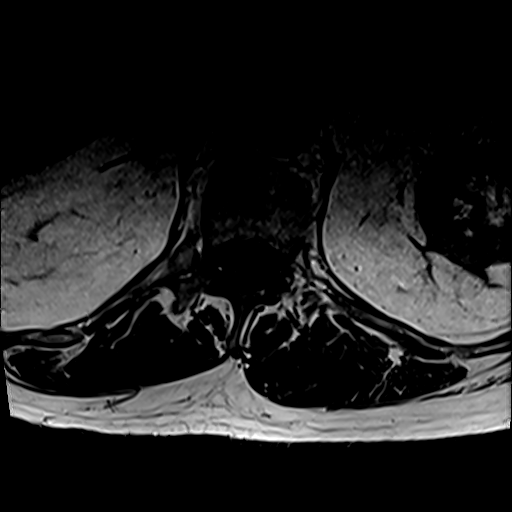

[Series 14: T2 · sagittal · 4.0mm · 0.84mm/px · 6 of 17 slices shown (2 of 2)]
[im 1/17]
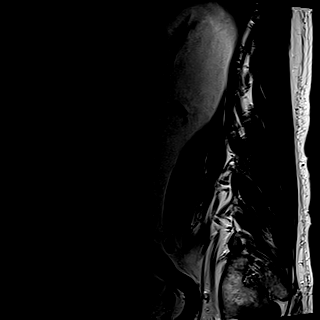
[im 4/17]
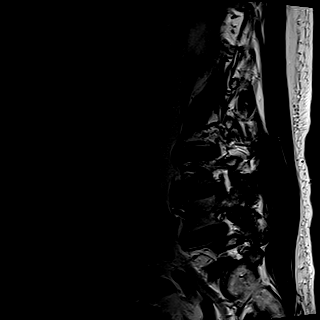
[im 7/17]
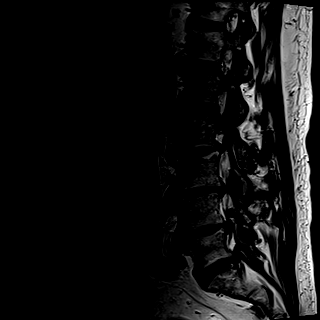
[im 10/17]
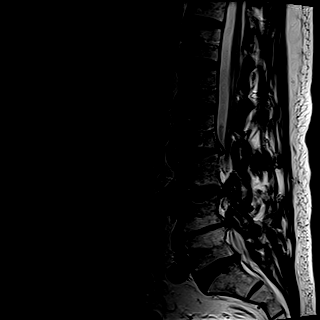
[im 13/17]
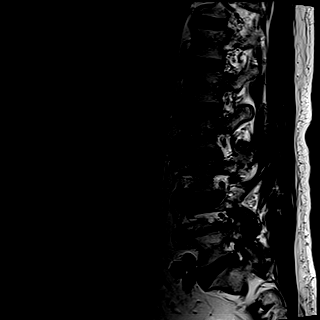
[im 17/17]
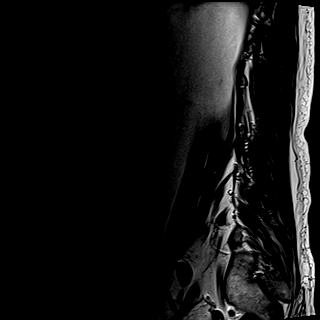

[31 of 48 positions shown; findings below may reference images not displayed]

FINDINGS: Segmentation:  Standard

Alignment:  Grade 1 retrolisthesis at L3-4

Vertebrae: Modic type 1 signal changes at L3-4 and Modic type 2
changes at L4-5.

Conus medullaris and cauda equina: Conus extends to the L1 level.
Conus and cauda equina appear normal.

Paraspinal and other soft tissues: Negative

Disc levels:

L1-L2: Normal disc space and facet joints. No spinal canal stenosis.
No neural foraminal stenosis.

L2-L3: Intermediate sized disc bulge with severe facet hypertrophy.
Severe spinal canal stenosis. No neural foraminal stenosis.

L3-L4: Intermediate sized disc bulge with moderate facet
hypertrophy. Moderate spinal canal stenosis. Moderate left neural
foraminal stenosis.

L4-L5: Severe facet hypertrophy and large disc bulge. Severe spinal
canal stenosis mass effect on the cauda equina. Mild left neural
foraminal stenosis.

L5-S1: Disc bulge with no spinal canal stenosis. No neural foraminal
stenosis.

Visualized sacrum: Normal.
IMPRESSION: 1. Severe spinal canal stenosis at L2-3 and L4-5 due to combination
of disc bulges and facet arthrosis. There is mass effect on the
cauda equina particularly at the L4-5 level.
2. Moderate L3-4 spinal canal stenosis and moderate left neural
foraminal stenosis.

## 2023-04-16 ENCOUNTER — Other Ambulatory Visit: Payer: Self-pay | Admitting: Family Medicine

## 2023-04-17 ENCOUNTER — Ambulatory Visit (INDEPENDENT_AMBULATORY_CARE_PROVIDER_SITE_OTHER): Payer: Medicare Other

## 2023-04-17 DIAGNOSIS — I639 Cerebral infarction, unspecified: Secondary | ICD-10-CM

## 2023-04-20 LAB — CUP PACEART REMOTE DEVICE CHECK
Date Time Interrogation Session: 20240426230050
Implantable Pulse Generator Implant Date: 20210525

## 2023-04-24 NOTE — Progress Notes (Signed)
Carelink Summary Report / Loop Recorder 

## 2023-05-12 NOTE — Progress Notes (Signed)
Carelink Summary Report / Loop Recorder 

## 2023-05-14 ENCOUNTER — Other Ambulatory Visit: Payer: Self-pay | Admitting: Family Medicine

## 2023-05-14 DIAGNOSIS — I82412 Acute embolism and thrombosis of left femoral vein: Secondary | ICD-10-CM

## 2023-05-20 ENCOUNTER — Ambulatory Visit (INDEPENDENT_AMBULATORY_CARE_PROVIDER_SITE_OTHER): Payer: Medicare Other

## 2023-05-20 DIAGNOSIS — I639 Cerebral infarction, unspecified: Secondary | ICD-10-CM

## 2023-05-21 LAB — CUP PACEART REMOTE DEVICE CHECK
Date Time Interrogation Session: 20240529230536
Implantable Pulse Generator Implant Date: 20210525

## 2023-06-01 DIAGNOSIS — M48062 Spinal stenosis, lumbar region with neurogenic claudication: Secondary | ICD-10-CM | POA: Diagnosis not present

## 2023-06-11 NOTE — Progress Notes (Signed)
Carelink Summary Report / Loop Recorder 

## 2023-06-22 ENCOUNTER — Ambulatory Visit: Payer: Medicare Other

## 2023-06-22 DIAGNOSIS — I639 Cerebral infarction, unspecified: Secondary | ICD-10-CM | POA: Diagnosis not present

## 2023-06-23 LAB — CUP PACEART REMOTE DEVICE CHECK
Date Time Interrogation Session: 20240701230129
Implantable Pulse Generator Implant Date: 20210525

## 2023-07-08 NOTE — Progress Notes (Signed)
 Carelink Summary Report / Loop Recorder 

## 2023-07-21 ENCOUNTER — Other Ambulatory Visit: Payer: Self-pay | Admitting: Family Medicine

## 2023-07-21 ENCOUNTER — Other Ambulatory Visit: Payer: Self-pay | Admitting: Cardiovascular Disease

## 2023-07-22 ENCOUNTER — Telehealth: Payer: Self-pay | Admitting: Family Medicine

## 2023-07-22 ENCOUNTER — Ambulatory Visit: Payer: Medicare Other | Attending: Nurse Practitioner | Admitting: Nurse Practitioner

## 2023-07-22 ENCOUNTER — Encounter: Payer: Self-pay | Admitting: Nurse Practitioner

## 2023-07-22 VITALS — BP 126/80 | HR 71 | Ht 66.0 in | Wt 164.6 lb

## 2023-07-22 DIAGNOSIS — I748 Embolism and thrombosis of other arteries: Secondary | ICD-10-CM | POA: Diagnosis not present

## 2023-07-22 DIAGNOSIS — R0602 Shortness of breath: Secondary | ICD-10-CM | POA: Diagnosis not present

## 2023-07-22 DIAGNOSIS — I10 Essential (primary) hypertension: Secondary | ICD-10-CM

## 2023-07-22 DIAGNOSIS — Z7901 Long term (current) use of anticoagulants: Secondary | ICD-10-CM

## 2023-07-22 DIAGNOSIS — I25118 Atherosclerotic heart disease of native coronary artery with other forms of angina pectoris: Secondary | ICD-10-CM | POA: Diagnosis not present

## 2023-07-22 DIAGNOSIS — R1012 Left upper quadrant pain: Secondary | ICD-10-CM | POA: Diagnosis not present

## 2023-07-22 DIAGNOSIS — E785 Hyperlipidemia, unspecified: Secondary | ICD-10-CM

## 2023-07-22 DIAGNOSIS — I639 Cerebral infarction, unspecified: Secondary | ICD-10-CM

## 2023-07-22 MED ORDER — NITROGLYCERIN 0.4 MG SL SUBL: 0.4000 mg | SUBLINGUAL_TABLET | SUBLINGUAL | 3 refills | Status: DC | PRN

## 2023-07-22 NOTE — Telephone Encounter (Signed)
FYI: This call has been transferred to Access Nurse. Once the result note has been entered staff can address the message at that time.  Patient called in with the following symptoms:  Red Word:chest pain   Please advise at [place] [phone] (mobile)  Message is routed to Provider Pool and [facility] Triage     Pt's wife, Steward Drone called stating the pt was experiencing stomach & chest pain. Steward Drone stated the pt saw cardiologist on 7/31 & was suggested to see pcp. Steward Drone states the cardiologist believes the pain is more abdominal, intestine issues. Steward Drone requested that the pt sees Dr. Para March on 08/03/23

## 2023-07-22 NOTE — Patient Instructions (Signed)
Medication Instructions:   Your physician recommends that you continue on your current medications as directed. Please refer to the Current Medication list given to you today.   *If you need a refill on your cardiac medications before your next appointment, please call your pharmacy*   Lab Work:  TODAY!!!! TSH/CBC/CMET  If you have labs (blood work) drawn today and your tests are completely normal, you will receive your results only by: MyChart Message (if you have MyChart) OR A paper copy in the mail If you have any lab test that is abnormal or we need to change your treatment, we will call you to review the results.   Testing/Procedures:  None ordered.   Follow-Up: At [facility], you and your health needs are our priority.  As part of our continuing mission to provide you with exceptional heart care, we have created designated Provider Care Teams.  These Care Teams include your primary Cardiologist (physician) and Advanced Practice Providers (APPs -  Physician Assistants and Nurse Practitioners) who all work together to provide you with the care you need, when you need it.  We recommend signing up for the patient portal called "MyChart".  Sign up information is provided on this After Visit Summary.  MyChart is used to connect with patients for Virtual Visits (Telemedicine).  Patients are able to view lab/test results, encounter notes, upcoming appointments, etc.  Non-urgent messages can be sent to your provider as well.   To learn more about what you can do with MyChart, go to [email or web address].    Your next appointment:   6 month(s)  Provider:   Charlton Haws, MD     Other Instructions  Your physician wants you to follow-up in: 6 months.  You will receive a reminder letter in the mail two months in advance. If you don't receive a letter, please call our office to schedule the follow-up appointment.

## 2023-07-22 NOTE — Progress Notes (Signed)
Cardiology Office Note:  .   Date:  07/22/2023  ID:  Nathan Fernandez, DOB September 01, 1941, MRN [id number] PCP: Joaquim Nam, MD  [facility] Providers Cardiologist:  Charlton Haws, MD    Patient Profile: .      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 Eden Emms in 2014. Reported remote hx of MI 20 years previously. Presented with chest pain and had hs troponins consistent with NSTEMI 02/13/2021. LHC as outline above. No change in coronary anatomy from 2020 catheterization.  If no relief from symptoms, referral for CABG recommended.  Aspirin was added to Xarelto regimen.  It was noted his LAD was not suitable for PCI.  Echocardiogram 02/12/2021 showed an EF of 45 to 50%, global hypokinesis, mild LVH, intermediate diastolic parameters and no significant valvular abnormalities.  Last cardiology clinic visit was 07/24/22 with Dr. Eldridge Dace as work in for preoperative cardiac evaluation for lumbar decompression. He was deemed moderate risk with no further testing required.         History of Present Illness: .   Nathan Fernandez is a very pleasant 82 y.o. male who is here today with his wife for evaluation of abdominal pain. He describes a dull ache across abdomen with pain of differing varieties on the left and right side. Pain is below his nipple line. Reports quick sharp pains on left that are very short  lived and pain on right side that feels more like a dull ache. Admits to constipation for a few months. No n/v. Pain does not worsen after eating. Had loss of bowel function 2 days ago with no warning, felt like he needed to urinate and when he started walking he soiled himself. No melena. Gradual increase in SOB over the past year. His wife does not feel that these symptoms are similar to what he experienced prior to previous MI. He works in his yard on a regular basis. Works for 30-40 min and feels very winded when he stops. Takes him a while to recover. Also having more phlegm when he wakes up in the morning, occasionally uses albuterol inhaler.  He has chronic back pain which is very frustrating to him.  Reports he needs pain medication, but does not report any relief with hydrocodone. Advised him that narcotics can worsen constipation. His ILR continues to work, he plans to leave it in once it reaches end of battery life.   ROS: See HPI       Studies Reviewed: Marland Kitchen   EKG Interpretation Date/Time:  Wednesday July 22 2023 10:37:33 EDT Ventricular Rate:  71 PR Interval:  258 QRS Duration:  136 QT Interval:  450 QTC Calculation: 489 R Axis:   -72  Text Interpretation: Sinus rhythm with marked sinus arrhythmia with 1st degree A-V block Left axis deviation Right bundle branch block Septal infarct (cited on or before 23-Jul-2022) When compared with ECG  of 23-Jul-2022 14:52, Premature ventricular complexes are no longer Present Questionable change in initial forces of Anteroseptal leads Confirmed by Eligha Bridegroom [phone]) on 07/22/2023 10:47:54 AM     Risk Assessment/Calculations:             Physical Exam:   VS:  BP 126/80   Pulse 71   Ht 5\' 6"  (1.676 m)   Wt 164 lb 9.6 oz (74.7 kg)   SpO2 91%   BMI 26.57 kg/m    Wt Readings from Last 3 Encounters:  07/22/23 164 lb 9.6 oz (74.7 kg)  03/09/23 168 lb (76.2 kg)  07/28/22 175 lb (79.4 kg)    GEN: Well nourished, well developed in no acute  distress NECK: No JVD; No carotid bruits CARDIAC: RRR, no murmurs, rubs, gallops RESPIRATORY:  Clear to auscultation without rales, wheezing or rhonchi  ABDOMEN: Soft, tender in upper left quadrant, non-distended EXTREMITIES:  No edema; No deformity     ASSESSMENT AND PLAN: .    Abdominal pain/Hx splenic infarct: He has tenderness in LUQ of abdomen. Reports abdominal discomfort of various degrees and on various occasions. Admits to chronic constipation, worse recently.  I encouraged him to follow-up with PCP.  Shortness of breath: Reports increased SOB over the past year. He is able to complete his yard work without dyspnea, but feels very winded afterwards. No evidence of volume overload on exam.  We will check CBC, c-Met, TSH today. Advised follow-up with PCP for abdominal pain as noted above. If symptoms persist, consider repeat echo.   CAD: He is having abdominal pain over the past few weeks. He denies chest pain. Reports shortness of breath that he feels has progressed over the past year. No symptoms concerning for angina. No indication for further ischemic evaluation at this time.  Advised him to notify us if he develops symptoms concerning for angina prior to next office visit. No bleeding concerns. Continue rosuvastatin, Imdur, hydralazine, carvedilol, aspirin.  Hyperlipidemia LDL goal < 55: LDL 48 on 03/09/23. Continue rosuvastatin.   Hypertension: BP is well controlled.   Chronic combined CHF: Echo 02/12/21 with LVEF 45-50%, global HK, mild LVH, indeterminate diastolic parameters. Reports increasing shortness of breath over the past year. No dyspnea, orthopnea, PND, edema.  No evidence of volume overload on exam.  Consider repeat echocardiogram if symptoms of shortness of breath persist.    Chronic anticoagulation: He denies melena, no obvious signs of bleeding.  Will check CBC today.  Continue Xarelto 20 mg daily for anticoagulation secondary to cryptogenic stroke/splenic infarct.         Dispo: 6 months with Dr. Eden Emms  Signed, Eligha Bridegroom, NP-C

## 2023-07-23 MED ORDER — POLYETHYLENE GLYCOL 3350 17 GM/SCOOP PO POWD: 17.0000 g | Freq: Every day | ORAL | Status: DC | PRN

## 2023-07-23 NOTE — Telephone Encounter (Signed)
Agreed.  I saw the cardiology note.  Please schedule sooner if needed/possible.  If having trouble with constipation would try adding on miralax to see if that helps.  Thanks.

## 2023-07-23 NOTE — Addendum Note (Signed)
Addended by: Joaquim Nam on: 07/23/2023 12:04 PM   Modules accepted: Orders

## 2023-07-23 NOTE — Telephone Encounter (Signed)
Pt is already scheduled with Dr Para March on 08/03/23 at 11 am. I was unable to speak with pt or his wife and left v/m requesting cb from pt or his wife to see if pt wanted to be seen sooner. Sending note to Dr Para March who is out of office and Dr Milinda Antis who is in office and lsc triage.Marland Kitchen

## 2023-07-24 NOTE — Telephone Encounter (Signed)
Pts wife (DPR signed) called back and notified as instructed and Mrs Ellers voiced understanding.  Offered 2 appts for next wk with Dr Para March but did not work with pts schedule. Mrs Thong said to leave appt already scheduled on 08/03/23 at 11 am with Dr Para March.  Mrs Michael said pt periodically has problems with constipation. Mrs Holliman said that pt eats a lot of binding foods such as cheese and peanut butter and pt has  not decided to change dietary choices.UC & ED precautions given and pts wife voiced understanding. Sending note as FYI to Dr Para March who is out of office.

## 2023-07-26 LAB — CUP PACEART REMOTE DEVICE CHECK
Date Time Interrogation Session: 20240803230706
Implantable Pulse Generator Implant Date: 20210525

## 2023-07-27 ENCOUNTER — Ambulatory Visit: Payer: Medicare Other

## 2023-07-27 DIAGNOSIS — I639 Cerebral infarction, unspecified: Secondary | ICD-10-CM

## 2023-08-03 ENCOUNTER — Encounter: Payer: Self-pay | Admitting: Family Medicine

## 2023-08-03 ENCOUNTER — Ambulatory Visit (INDEPENDENT_AMBULATORY_CARE_PROVIDER_SITE_OTHER): Payer: Medicare Other | Admitting: Family Medicine

## 2023-08-03 VITALS — BP 124/80 | HR 75 | Temp 97.9°F | Ht 66.0 in | Wt 165.0 lb

## 2023-08-03 DIAGNOSIS — R269 Unspecified abnormalities of gait and mobility: Secondary | ICD-10-CM | POA: Diagnosis not present

## 2023-08-03 DIAGNOSIS — M545 Low back pain, unspecified: Secondary | ICD-10-CM

## 2023-08-03 DIAGNOSIS — N478 Other disorders of prepuce: Secondary | ICD-10-CM

## 2023-08-03 DIAGNOSIS — R109 Unspecified abdominal pain: Secondary | ICD-10-CM
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
## 2023-08-03 NOTE — Patient Instructions (Addendum)
3 options:  Miralax twice weekly.  Cut out all diary.  Take lactaid pills with diary.   Let me know how it goes.   You should get a call about seeing PT.  Take care.  Glad to see you.

## 2023-08-05 DIAGNOSIS — R109 Unspecified abdominal pain: Secondary | ICD-10-CM | POA: Insufficient documentation

## 2023-08-05 DIAGNOSIS — N478 Other disorders of prepuce: Secondary | ICD-10-CM | POA: Insufficient documentation

## 2023-08-05 NOTE — Assessment & Plan Note (Signed)
Routine cautions given to patient.  No emergent symptoms.  Referred to urology.

## 2023-08-05 NOTE — Assessment & Plan Note (Signed)
With subsequent gait abnormality since back surgery.  Discussed getting PT set up.  Referred.

## 2023-08-05 NOTE — Assessment & Plan Note (Addendum)
Presumed to be from constipation.  Differential discussed with patient.  This would be the most likely explanation. Discussed 3 options:  Miralax twice weekly.  Cut out all diary.  Take lactaid pills with diary.   He wanted to try MiraLAX first and I think that is a reasonable option.  He can update me as needed.

## 2023-08-07 NOTE — Progress Notes (Signed)
Carelink Summary Report / Loop Recorder 

## 2023-08-19 DIAGNOSIS — R262 Difficulty in walking, not elsewhere classified: Secondary | ICD-10-CM | POA: Diagnosis not present

## 2023-08-21 DIAGNOSIS — H4053X1 Glaucoma secondary to other eye disorders, bilateral, mild stage: Secondary | ICD-10-CM | POA: Diagnosis not present

## 2023-08-28 LAB — CUP PACEART REMOTE DEVICE CHECK
Date Time Interrogation Session: 20240905230106
Implantable Pulse Generator Implant Date: 20210525

## 2023-08-31 ENCOUNTER — Ambulatory Visit (INDEPENDENT_AMBULATORY_CARE_PROVIDER_SITE_OTHER): Payer: Medicare Other

## 2023-08-31 DIAGNOSIS — I639 Cerebral infarction, unspecified: Secondary | ICD-10-CM | POA: Diagnosis not present

## 2023-09-07 DIAGNOSIS — R262 Difficulty in walking, not elsewhere classified: Secondary | ICD-10-CM | POA: Diagnosis not present

## 2023-09-11 DIAGNOSIS — R262 Difficulty in walking, not elsewhere classified: Secondary | ICD-10-CM | POA: Diagnosis not present

## 2023-09-14 ENCOUNTER — Encounter (HOSPITAL_BASED_OUTPATIENT_CLINIC_OR_DEPARTMENT_OTHER): Payer: Self-pay | Admitting: Emergency Medicine

## 2023-09-14 ENCOUNTER — Inpatient Hospital Stay (HOSPITAL_BASED_OUTPATIENT_CLINIC_OR_DEPARTMENT_OTHER)
Admission: EM | Admit: 2023-09-14 | Discharge: 2023-09-18 | DRG: 291 | Disposition: A | Discharge status: Home Health | Payer: Medicare Other | Attending: Internal Medicine | Admitting: Internal Medicine

## 2023-09-14 ENCOUNTER — Emergency Department (HOSPITAL_BASED_OUTPATIENT_CLINIC_OR_DEPARTMENT_OTHER): Payer: Medicare Other

## 2023-09-14 ENCOUNTER — Other Ambulatory Visit: Payer: Self-pay

## 2023-09-14 DIAGNOSIS — Z888 Allergy status to other drugs, medicaments and biological substances status: Secondary | ICD-10-CM

## 2023-09-14 DIAGNOSIS — I8290 Acute embolism and thrombosis of unspecified vein: Secondary | ICD-10-CM

## 2023-09-14 DIAGNOSIS — I161 Hypertensive emergency: Secondary | ICD-10-CM | POA: Diagnosis not present

## 2023-09-14 DIAGNOSIS — E7800 Pure hypercholesterolemia, unspecified: Secondary | ICD-10-CM | POA: Diagnosis not present

## 2023-09-14 DIAGNOSIS — R918 Other nonspecific abnormal finding of lung field: Secondary | ICD-10-CM | POA: Diagnosis not present

## 2023-09-14 DIAGNOSIS — M1990 Unspecified osteoarthritis, unspecified site: Secondary | ICD-10-CM | POA: Diagnosis present

## 2023-09-14 DIAGNOSIS — I5033 Acute on chronic diastolic (congestive) heart failure: Secondary | ICD-10-CM | POA: Diagnosis not present

## 2023-09-14 DIAGNOSIS — Z87891 Personal history of nicotine dependence: Secondary | ICD-10-CM

## 2023-09-14 DIAGNOSIS — I252 Old myocardial infarction: Secondary | ICD-10-CM | POA: Diagnosis not present

## 2023-09-14 DIAGNOSIS — I4510 Unspecified right bundle-branch block: Secondary | ICD-10-CM | POA: Diagnosis present

## 2023-09-14 DIAGNOSIS — N183 Chronic kidney disease, stage 3 unspecified: Secondary | ICD-10-CM | POA: Diagnosis present

## 2023-09-14 DIAGNOSIS — I5023 Acute on chronic systolic (congestive) heart failure: Principal | ICD-10-CM

## 2023-09-14 DIAGNOSIS — E1169 Type 2 diabetes mellitus with other specified complication: Secondary | ICD-10-CM | POA: Diagnosis present

## 2023-09-14 DIAGNOSIS — N400 Enlarged prostate without lower urinary tract symptoms: Secondary | ICD-10-CM | POA: Diagnosis present

## 2023-09-14 DIAGNOSIS — E785 Hyperlipidemia, unspecified: Secondary | ICD-10-CM | POA: Diagnosis not present

## 2023-09-14 DIAGNOSIS — Z85828 Personal history of other malignant neoplasm of skin: Secondary | ICD-10-CM

## 2023-09-14 DIAGNOSIS — E119 Type 2 diabetes mellitus without complications: Secondary | ICD-10-CM

## 2023-09-14 DIAGNOSIS — Z79899 Other long term (current) drug therapy: Secondary | ICD-10-CM | POA: Diagnosis not present

## 2023-09-14 DIAGNOSIS — J9601 Acute respiratory failure with hypoxia: Secondary | ICD-10-CM

## 2023-09-14 DIAGNOSIS — I5043 Acute on chronic combined systolic (congestive) and diastolic (congestive) heart failure: Secondary | ICD-10-CM | POA: Diagnosis not present

## 2023-09-14 DIAGNOSIS — Z86718 Personal history of other venous thrombosis and embolism: Secondary | ICD-10-CM | POA: Diagnosis not present

## 2023-09-14 DIAGNOSIS — I130 Hypertensive heart and chronic kidney disease with heart failure and stage 1 through stage 4 chronic kidney disease, or unspecified chronic kidney disease: Principal | ICD-10-CM | POA: Diagnosis present

## 2023-09-14 DIAGNOSIS — Z823 Family history of stroke: Secondary | ICD-10-CM | POA: Diagnosis not present

## 2023-09-14 DIAGNOSIS — J449 Chronic obstructive pulmonary disease, unspecified: Secondary | ICD-10-CM

## 2023-09-14 DIAGNOSIS — E1165 Type 2 diabetes mellitus with hyperglycemia: Secondary | ICD-10-CM | POA: Diagnosis not present

## 2023-09-14 DIAGNOSIS — I2510 Atherosclerotic heart disease of native coronary artery without angina pectoris: Secondary | ICD-10-CM

## 2023-09-14 DIAGNOSIS — N179 Acute kidney failure, unspecified: Secondary | ICD-10-CM | POA: Diagnosis not present

## 2023-09-14 DIAGNOSIS — Z7901 Long term (current) use of anticoagulants: Secondary | ICD-10-CM

## 2023-09-14 DIAGNOSIS — I10 Essential (primary) hypertension: Secondary | ICD-10-CM

## 2023-09-14 DIAGNOSIS — Z8673 Personal history of transient ischemic attack (TIA), and cerebral infarction without residual deficits: Secondary | ICD-10-CM | POA: Diagnosis not present

## 2023-09-14 DIAGNOSIS — I5021 Acute systolic (congestive) heart failure: Secondary | ICD-10-CM | POA: Diagnosis not present

## 2023-09-14 DIAGNOSIS — Z8249 Family history of ischemic heart disease and other diseases of the circulatory system: Secondary | ICD-10-CM

## 2023-09-14 DIAGNOSIS — I509 Heart failure, unspecified: Secondary | ICD-10-CM | POA: Insufficient documentation

## 2023-09-14 DIAGNOSIS — Z7982 Long term (current) use of aspirin: Secondary | ICD-10-CM

## 2023-09-14 DIAGNOSIS — Z1152 Encounter for screening for COVID-19: Secondary | ICD-10-CM | POA: Diagnosis not present

## 2023-09-14 DIAGNOSIS — R0602 Shortness of breath: Secondary | ICD-10-CM | POA: Diagnosis not present

## 2023-09-14 LAB — CBC WITH DIFFERENTIAL/PLATELET
Abs Immature Granulocytes: 0.02 10*3/uL (ref 0.00–0.07)
Basophils Absolute: 0 10*3/uL (ref 0.0–0.1)
Basophils Relative: 0 %
Eosinophils Absolute: 0.2 10*3/uL (ref 0.0–0.5)
Eosinophils Relative: 3 %
HCT: 39.6 % (ref 39.0–52.0)
Hemoglobin: 13.4 g/dL (ref 13.0–17.0)
Immature Granulocytes: 0 %
Lymphocytes Relative: 17 %
Lymphs Abs: 1.2 10*3/uL (ref 0.7–4.0)
MCH: 30.7 pg (ref 26.0–34.0)
MCHC: 33.8 g/dL (ref 30.0–36.0)
MCV: 90.8 fL (ref 80.0–100.0)
Monocytes Absolute: 0.6 10*3/uL (ref 0.1–1.0)
Monocytes Relative: 8 %
Neutro Abs: 5.2 10*3/uL (ref 1.7–7.7)
Neutrophils Relative %: 72 %
Platelets: 212 10*3/uL (ref 150–400)
RBC: 4.36 MIL/uL (ref 4.22–5.81)
RDW: 13.9 % (ref 11.5–15.5)
WBC: 7.2 10*3/uL (ref 4.0–10.5)
nRBC: 0 % (ref 0.0–0.2)

## 2023-09-14 LAB — COMPREHENSIVE METABOLIC PANEL
ALT: 19 U/L (ref 0–44)
AST: 26 U/L (ref 15–41)
Albumin: 3 g/dL — ABNORMAL LOW (ref 3.5–5.0)
Alkaline Phosphatase: 48 U/L (ref 38–126)
Anion gap: 8 (ref 5–15)
BUN: 13 mg/dL (ref 8–23)
CO2: 26 mmol/L (ref 22–32)
Calcium: 8.2 mg/dL — ABNORMAL LOW (ref 8.9–10.3)
Chloride: 104 mmol/L (ref 98–111)
Creatinine, Ser: 0.88 mg/dL (ref 0.61–1.24)
GFR, Estimated: >60 mL/min (ref >60)
Glucose, Bld: 180 mg/dL — ABNORMAL HIGH (ref 70–99)
Potassium: 4.1 mmol/L (ref 3.5–5.1)
Sodium: 138 mmol/L (ref 135–145)
Total Bilirubin: 0.8 mg/dL (ref 0.3–1.2)
Total Protein: 5.7 g/dL — ABNORMAL LOW (ref 6.5–8.1)

## 2023-09-14 LAB — BRAIN NATRIURETIC PEPTIDE: B Natriuretic Peptide: 496.4 pg/mL — ABNORMAL HIGH (ref 0.0–100.0)

## 2023-09-14 LAB — TROPONIN I (HIGH SENSITIVITY): Troponin I (High Sensitivity): 19 ng/L — ABNORMAL HIGH (ref <18)

## 2023-09-14 LAB — SARS CORONAVIRUS 2 BY RT PCR: SARS Coronavirus 2 by RT PCR: NEGATIVE

## 2023-09-14 MED ORDER — BUPROPION HCL ER (SR) 150 MG PO TB12
150.0000 mg | ORAL_TABLET | Freq: Every day | ORAL | Status: DC
  Filled 2023-09-14 (×3): qty 1

## 2023-09-14 MED ORDER — FUROSEMIDE 10 MG/ML IJ SOLN
20.0000 mg | Freq: Once | INTRAMUSCULAR | Status: AC
  Administered 2023-09-14: 20 mg via INTRAVENOUS
  Filled 2023-09-14: qty 2

## 2023-09-14 MED ORDER — ROSUVASTATIN CALCIUM 20 MG PO TABS
20.0000 mg | ORAL_TABLET | Freq: Every day | ORAL | Status: DC
  Administered 2023-09-15 – 2023-09-18 (×4): 20 mg via ORAL
  Filled 2023-09-14 (×4): qty 1

## 2023-09-14 MED ORDER — HYDRALAZINE HCL 10 MG PO TABS
10.0000 mg | ORAL_TABLET | Freq: Two times a day (BID) | ORAL | Status: DC
  Administered 2023-09-14 – 2023-09-18 (×8): 10 mg via ORAL
  Filled 2023-09-14 (×8): qty 1

## 2023-09-14 MED ORDER — TRAZODONE HCL 100 MG PO TABS
200.0000 mg | ORAL_TABLET | Freq: Every day | ORAL | Status: DC
  Administered 2023-09-14 – 2023-09-17 (×4): 200 mg via ORAL
  Filled 2023-09-14 (×4): qty 2

## 2023-09-14 MED ORDER — FUROSEMIDE 10 MG/ML IJ SOLN
40.0000 mg | Freq: Every day | INTRAMUSCULAR | Status: DC
  Administered 2023-09-15: 40 mg via INTRAVENOUS
  Filled 2023-09-14: qty 4

## 2023-09-14 MED ORDER — CARVEDILOL 6.25 MG PO TABS
6.2500 mg | ORAL_TABLET | Freq: Two times a day (BID) | ORAL | Status: DC
  Administered 2023-09-15 – 2023-09-18 (×7): 6.25 mg via ORAL
  Filled 2023-09-14 (×7): qty 1

## 2023-09-14 MED ORDER — FENOFIBRATE 54 MG PO TABS
54.0000 mg | ORAL_TABLET | Freq: Every day | ORAL | Status: DC
  Administered 2023-09-15 – 2023-09-18 (×4): 54 mg via ORAL
  Filled 2023-09-14 (×5): qty 1

## 2023-09-14 MED ORDER — HYDRALAZINE HCL 20 MG/ML IJ SOLN
10.0000 mg | Freq: Once | INTRAMUSCULAR | Status: AC
  Administered 2023-09-14: 10 mg via INTRAVENOUS
  Filled 2023-09-14: qty 1

## 2023-09-14 MED ORDER — POLYETHYLENE GLYCOL 3350 17 G PO PACK: 17.0000 g | PACK | Freq: Every day | ORAL | Status: DC | PRN

## 2023-09-14 MED ORDER — ASPIRIN 81 MG PO TBEC
81.0000 mg | DELAYED_RELEASE_TABLET | Freq: Every day | ORAL | Status: DC
  Administered 2023-09-15 – 2023-09-18 (×4): 81 mg via ORAL
  Filled 2023-09-14 (×4): qty 1

## 2023-09-14 MED ORDER — INSULIN ASPART 100 UNIT/ML IJ SOLN
0.0000 [IU] | Freq: Three times a day (TID) | INTRAMUSCULAR | Status: DC
  Administered 2023-09-15: 1 [IU] via SUBCUTANEOUS
  Administered 2023-09-15 – 2023-09-16 (×2): 2 [IU] via SUBCUTANEOUS
  Administered 2023-09-17: 1 [IU] via SUBCUTANEOUS
  Administered 2023-09-17: 2 [IU] via SUBCUTANEOUS
  Administered 2023-09-17 – 2023-09-18 (×2): 1 [IU] via SUBCUTANEOUS

## 2023-09-14 MED ORDER — RIVAROXABAN 20 MG PO TABS
20.0000 mg | ORAL_TABLET | Freq: Every day | ORAL | Status: DC
  Administered 2023-09-15 – 2023-09-17 (×3): 20 mg via ORAL
  Filled 2023-09-14 (×3): qty 1

## 2023-09-14 MED ORDER — ISOSORBIDE MONONITRATE ER 30 MG PO TB24
15.0000 mg | ORAL_TABLET | Freq: Every day | ORAL | Status: DC
  Administered 2023-09-15 – 2023-09-18 (×4): 15 mg via ORAL
  Filled 2023-09-14 (×4): qty 1

## 2023-09-14 NOTE — Assessment & Plan Note (Signed)
-  required 2L via [place] on presentation -secondary to acute on chronic combined CHF  -IV diuresis -wean as tolerated with goal >94%.

## 2023-09-14 NOTE — ED Provider Notes (Signed)
[facility] Provider Note   CSN: [id number] Arrival date & time: 09/14/23  1123     History  Chief Complaint  Patient presents with   Shortness of Breath    Nathan Fernandez is a 82 y.o. male.  82 yo M with a cc of shortness of breath.  This been going on for some time off and on but much worse over the past couple days and even worse yesterday.  He says walking even short distances he gets very short of breath.  Has had some leg swelling that he has not really noticed.  Coughs off and on but nothing more than normal.  No fevers.  No known sick contacts.  He has had some discomfort to his chest that makes him feel like he needs to belch.  He does not think this is like his last heart attack.   Shortness of Breath      Home Medications Prior to Admission medications   Medication Sig Start Date End Date Taking? Authorizing Provider  albuterol (VENTOLIN HFA) 108 (90 Base) MCG/ACT inhaler Inhale 1-2 puffs into the lungs every 6 (six) hours as needed (COPD). 05/01/22   Joaquim Nam, MD  Ascorbic Acid (VITAMIN C PO) Take 1 tablet by mouth daily.    [provider]  aspirin 81 MG EC tablet Take 81 mg by mouth daily.    [provider]  B Complex CAPS Take 1 capsule by mouth daily.    [provider]  buPROPion [facility] SR) 150 MG 12 hr tablet TAKE 1 TABLET BY MOUTH EVERYDAY AT BEDTIME 07/22/23   Joaquim Nam, MD  carvedilol (COREG) 6.25 MG tablet TAKE 1 TABLET BY MOUTH TWICE A DAY 04/16/23   Joaquim Nam, MD  Cholecalciferol (VITAMIN D-3 PO) Take 1 capsule by mouth daily.    [provider]  cyclobenzaprine (FLEXERIL) 5 MG tablet TAKE 1 TABLET BY MOUTH THREE TIMES A DAY AS NEEDED 07/22/23   Joaquim Nam, MD  dorzolamide-timolol (COSOPT) 22.3-6.8 MG/ML ophthalmic solution Place 1 drop into both eyes 2 (two) times daily. 02/13/20   [provider]  ELDERBERRY PO Take 1 capsule by mouth  daily.    [provider]  fenofibrate (TRICOR) 48 MG tablet TAKE 1 TABLET BY MOUTH EVERY DAY 04/16/23   Joaquim Nam, MD  gabapentin (NEURONTIN) 100 MG capsule 1 tab in the AM, 1 midday, and 2 at night. 03/09/23   Joaquim Nam, MD  hydrALAZINE (APRESOLINE) 10 MG tablet TAKE 1 TABLET BY MOUTH EVERY 12 HOURS. 07/22/23   Wendall Stade, MD  isosorbide mononitrate (IMDUR) 30 MG 24 hr tablet TAKE 1/2 TABLET BY MOUTH DAILY 07/22/23   Wendall Stade, MD  ketoconazole (NIZORAL) 2 % cream APPLY 1 APPLICATION TOPICALLY DAILY AS NEEDED FOR IRRITATION. 03/05/23   Joaquim Nam, MD  nitroGLYCERIN (NITROSTAT) 0.4 MG SL tablet Place 1 tablet (0.4 mg total) under the tongue every 5 (five) minutes as needed for chest pain. Up to 3 doses 07/22/23   Swinyer, Zachary George, NP  polyethylene glycol powder (GLYCOLAX/MIRALAX) 17 GM/SCOOP powder Take 17 g by mouth daily as needed. 07/23/23   Joaquim Nam, MD  rosuvastatin (CRESTOR) 20 MG tablet TAKE 1 TABLET BY MOUTH EVERY DAY 07/22/23   Joaquim Nam, MD  traZODone (DESYREL) 100 MG tablet TAKE 2 TABLETS BY MOUTH AT BEDTIME 07/22/23   Joaquim Nam, MD  XARELTO 20  MG TABS tablet TAKE 1 TABLET BY MOUTH DAILY WITH SUPPER. 05/14/23   Joaquim Nam, MD      Allergies    Ace inhibitors, Angiotensin receptor blockers, Vasotec [enalapril], and Pravachol [pravastatin]    Review of Systems   Review of Systems  Respiratory:  Positive for shortness of breath.     Physical Exam Updated Vital Signs BP (!) 165/103 (BP Location: Right Arm)   Pulse 72   Temp 99 F (37.2 C) (Oral)   Resp 17   Ht 5\' 9"  (1.753 m)   Wt 77.1 kg   SpO2 98%   BMI 25.10 kg/m  Physical Exam Vitals and nursing note reviewed.  Constitutional:      Appearance: He is well-developed.  HENT:     Head: Normocephalic and atraumatic.  Eyes:     Pupils: Pupils are equal, round, and reactive to light.  Neck:     Vascular: No JVD.  Cardiovascular:     Rate and Rhythm: Normal  rate and regular rhythm.     Heart sounds: No murmur heard.    No friction rub. No gallop.  Pulmonary:     Effort: No respiratory distress.     Breath sounds: Rales present. No wheezing.     Comments: Rales in the bases bilaterally. Abdominal:     General: There is no distension.     Tenderness: There is no abdominal tenderness. There is no guarding or rebound.  Musculoskeletal:        General: Normal range of motion.     Cervical back: Normal range of motion and neck supple.     Right lower leg: Edema present.     Left lower leg: Edema present.     Comments: 2+ to BLE up to the knees  Skin:    Coloration: Skin is not pale.     Findings: No rash.  Neurological:     Mental Status: He is alert and oriented to person, place, and time.  Psychiatric:        Behavior: Behavior normal.     ED Results / Procedures / Treatments   Labs (all labs ordered are listed, but only abnormal results are displayed) Labs Reviewed  COMPREHENSIVE METABOLIC PANEL - Abnormal; Notable for the following components:      Result Value   Glucose, Bld 180 (*)    Calcium 8.2 (*)    Total Protein 5.7 (*)    Albumin 3.0 (*)    All other components within normal limits  BRAIN NATRIURETIC PEPTIDE - Abnormal; Notable for the following components:   B Natriuretic Peptide 496.4 (*)    All other components within normal limits  TROPONIN I (HIGH SENSITIVITY) - Abnormal; Notable for the following components:   Troponin I (High Sensitivity) 19 (*)    All other components within normal limits  SARS CORONAVIRUS 2 BY RT PCR  CBC WITH DIFFERENTIAL/PLATELET    EKG EKG Interpretation Date/Time:  Monday September 14 2023 11:45:55 EDT Ventricular Rate:  78 PR Interval:  245 QRS Duration:  139 QT Interval:  456 QTC Calculation: 520 R Axis:   -70  Text Interpretation: Sinus rhythm Atrial premature complex Prolonged PR interval RBBB and LAFB Probable anteroseptal infarct, old No significant change since last  tracing Confirmed by Melene Plan [phone]) on 09/14/2023 12:22:49 PM  Radiology DG Chest Port 1 View  Result Date: 09/14/2023 CLINICAL DATA:  Shortness of breath. EXAM: PORTABLE CHEST 1 VIEW COMPARISON:  February 11, 2021. FINDINGS: Stable  cardiomediastinal silhouette. Mild bibasilar opacities are noted concerning for edema or possibly subsegmental atelectasis. Bony thorax is unremarkable. IMPRESSION: Mild bibasilar edema or subsegmental atelectasis. Minimal pleural effusions may be present. Electronically Signed   By: Lupita Raider M.D.   On: 09/14/2023 13:53    Procedures Procedures    Medications Ordered in ED Medications  furosemide (LASIX) injection 20 mg (20 mg Intravenous Given 09/14/23 1328)    ED Course/ Medical Decision Making/ A&P                                 Medical Decision Making Amount and/or Complexity of Data Reviewed Labs: ordered. Radiology: ordered.  Risk Prescription drug management. Decision regarding hospitalization.   82 yo M with a chief complaints of difficulty breathing.  Testing has had some trouble off and on but got much worse over this weekend.  Patient is hypoxic down to 80% on room air.  He is slightly tachypneic.  With lower extremity edema and history of MI I suspect he likely has a heart failure exacerbation.  Will obtain a chest x-ray blood work.  Reassess.  Patient's BNP is elevated.  Troponin 19, no anemia, no significant electrolyte abnormalities.  Will give a bolus dose of Lasix here.  Chest x-ray on my independent interpretation with likely bilateral pleural effusions.  Will discuss with medicine for admission.  The patients results and plan were reviewed and discussed.   Any x-rays performed were independently reviewed by myself.   Differential diagnosis were considered with the presenting HPI.  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

## 2023-09-14 NOTE — ED Notes (Signed)
Called [facility] for followup for transport spoke with [place] and she said they were called and are just waiting on a truck

## 2023-09-14 NOTE — Assessment & Plan Note (Signed)
No acute coronary syndrome, continue B blockade and aspirin.  Rosuvastatin.

## 2023-09-14 NOTE — ED Notes (Signed)
Pt trialed on room air, O2 sats decreased to 88% on room air, good waveform noted. Pt placed back on 2L O2 via nasal cannula, O2 sats increased to 95%.

## 2023-09-14 NOTE — Assessment & Plan Note (Addendum)
Echocardiogram from 2022 with mild reduction in LV systolic function with EF 45 to 50%, global hypokinesis, mild LVH, low normal RV systolic function, no significant valvular disease.   Urine output 1,800 cc Systolic blood pressure 133 to 149 mmHg.  Continue with signs of hypervolemia.   Plan to continue diuresis with IV furosemide, increase dose to 60 mg IV bid.  Add spironolactone and SGLT 2 inh Continue with carvedilol and isosorbide.  Follow up on new echocardiogram.

## 2023-09-14 NOTE — ED Notes (Signed)
Soup, crackers and ginger ale given.

## 2023-09-14 NOTE — H&P (Signed)
History and Physical    Patient: Nathan Fernandez VOH:[id number] DOB: Jul 12, 1941 DOA: 09/14/2023 DOS: the patient was seen and examined on 09/14/2023 PCP: Joaquim Nam, MD  Patient coming from: [facility]   Chief Complaint:  Chief Complaint  Patient presents with   Shortness of Breath   HPI: Nathan Fernandez is a 82 y.o. 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   Had dyspnea with exertion for the past week and gradually worsened. Gets short of breath just walking across a room. Improves at rest. He was checking pulse ox last week and it was around 94%. No chest pain or palpitation. Has some increase LE edema. Pt not sure what medication he takes since wife helps with them. Not sure if he took his blood thinner. Has hx of splenic embolism.   In the ED, he was afebrile BP elevated 186/126 and was hypoxic to 89% and put on 2 L via nasal cannula.  No leukocytosis or anemia.  BMP is unremarkable other than elevated CBG of 180.  BNP of 496.  Troponin of 19.  Chest x-ray on my review with bilateral costophrenic angle blunting likely edema.  Patient was given IV 20 mg Lasix and a total of 20 mg hydralazine and transfer was requested for hospital management of acute on chronic CHF exacerbation.  Review of Systems: As mentioned in the history of present illness. All other systems reviewed and are negative. 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   Hosp R facial weakness   CAD (coronary artery disease)    MI, Hosp 1993, PTCA; LHC 04/18/13: Distal left main 40%, proximal LAD with several aneurysmal segments, proximal LAD 50% prior to and after aneurysmal segments, area does not appear to flow-limiting, proximal diagonal 70%, ostial circumflex 20%, proximal circumflex 20%, OM1  30-40%, proximal RCA 40%, mid RCA 30%, distal RCA 20%, EF 50% => med Rx   Cancer (HCC)    skin cancer - 2020   COPD (chronic obstructive pulmonary disease) (HCC)    DM2 (diabetes mellitus, type 2) (HCC)    DVT (deep venous thrombosis) (HCC) 12/2021   History of ETT 1998   wnl   History of hiatal hernia    History of MRI 08/04/2009   brain- atrophy sm vess dz   HLD (hyperlipidemia)    HTN (hypertension)    Myocardial infarction (HCC)    OA (osteoarthritis)    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HCC)    05/09/12 [place]: 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, large collateral proximal to aneurysm   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   Past Surgical History:  Procedure Laterality Date   BUBBLE STUDY  05/15/2020   Procedure: BUBBLE STUDY;  Surgeon: Chrystie Nose, MD;  Location: [facility] ENDOSCOPY;  Service: Cardiovascular;;   CARDIAC CATHETERIZATION     cataract surgery  08/2005   repair lens which moved   EYE SURGERY     LEFT HEART CATH AND CORONARY ANGIOGRAPHY N/A 01/21/2019   Procedure: LEFT HEART CATH AND CORONARY ANGIOGRAPHY;  Surgeon: Kathleene Hazel, MD;  Location: [facility] INVASIVE CV LAB;  Service: Cardiovascular;  Laterality: N/A;   LEFT HEART CATH AND CORONARY ANGIOGRAPHY N/A 02/13/2021   Procedure: LEFT HEART CATH AND CORONARY ANGIOGRAPHY;  Surgeon: Iran Ouch, MD;  Location: [facility] INVASIVE CV LAB;  Service: Cardiovascular;  Laterality: N/A;   LOOP RECORDER INSERTION N/A 05/15/2020   Procedure: LOOP RECORDER INSERTION;  Surgeon: Hillis Range, MD;  Location: [facility] INVASIVE CV LAB;  Service: Cardiovascular;  Laterality: N/A;   LUMBAR LAMINECTOMY/DECOMPRESSION MICRODISCECTOMY N/A 06/19/2021   Procedure: Lumbar Two-Three, Lumbar Three-Four, Lumbar Four-Five Laminectomy and Foraminotomy;  Surgeon: Donalee Citrin, MD;  Location: [facility] OR;  Service: Neurosurgery;  Laterality: N/A;   LUMBAR LAMINECTOMY/DECOMPRESSION  MICRODISCECTOMY Bilateral 07/28/2022   Procedure: Sublaminar decompression - Lumbar two-Lumbar three - bilateral redo;  Surgeon: Donalee Citrin, MD;  Location: [facility] OR;  Service: Neurosurgery;  Laterality: Bilateral;  3C   stress myoview  06/29/2006   sm distal anteroseptal & apical infarct   TEE WITHOUT CARDIOVERSION N/A 05/15/2020   Procedure: TRANSESOPHAGEAL ECHOCARDIOGRAM (TEE);  Surgeon: Chrystie Nose, MD;  Location: [facility] ENDOSCOPY;  Service: Cardiovascular;  Laterality: N/A;   THYROIDECTOMY, PARTIAL  1967   B9 growth   TONSILLECTOMY     Social History:  reports that he has quit smoking. His smoking use included cigars. He has never used smokeless tobacco. He reports current alcohol use. He reports that he does not use drugs.  Allergies  Allergen Reactions   Ace Inhibitors Swelling    Swelling of the tongue   Angiotensin Receptor Blockers Swelling    Tongue swelling   Vasotec [Enalapril] Swelling    Tongue swelling   Pravachol [Pravastatin] Other (See Comments)    Myalgias     Family History  Problem Relation Age of Onset   Hypertension Mother    Aneurysm Mother    Stroke Mother    Hypertension Father    Heart disease Father        CAD   Heart disease Son        MI   Colon cancer Neg Hx    Prostate cancer Neg Hx     Prior to Admission medications   Medication Sig Start Date End Date Taking? Authorizing Provider  albuterol (VENTOLIN HFA) 108 (90 Base) MCG/ACT inhaler Inhale 1-2 puffs into the lungs every 6 (six) hours as needed (COPD). 05/01/22   Joaquim Nam, MD  Ascorbic Acid (VITAMIN C PO) Take 1 tablet by mouth daily.    [provider]  aspirin 81 MG EC tablet Take 81 mg by mouth daily.    [provider]  B Complex CAPS Take 1 capsule by mouth daily.    [provider]  buPROPion [facility] SR) 150 MG 12 hr tablet TAKE 1 TABLET BY MOUTH EVERYDAY AT BEDTIME 07/22/23   Joaquim Nam, MD  carvedilol (COREG) 6.25 MG tablet TAKE 1 TABLET BY  MOUTH TWICE A DAY 04/16/23   Joaquim Nam, MD  Cholecalciferol (VITAMIN D-3 PO) Take 1 capsule by mouth daily.    [provider]  cyclobenzaprine (FLEXERIL) 5 MG tablet TAKE 1 TABLET BY MOUTH THREE TIMES A DAY AS NEEDED 07/22/23   Joaquim Nam, MD  dorzolamide-timolol (COSOPT) 22.3-6.8 MG/ML ophthalmic solution Place 1 drop into both eyes 2 (two) times daily. 02/13/20   [provider]  ELDERBERRY PO Take 1 capsule by mouth daily.    [provider]  fenofibrate (TRICOR) 48 MG tablet TAKE 1 TABLET BY MOUTH EVERY DAY 04/16/23   Joaquim Nam, MD  gabapentin (NEURONTIN) 100 MG capsule 1 tab in the AM, 1 midday, and 2 at night. 03/09/23   Joaquim Nam, MD  hydrALAZINE (APRESOLINE) 10 MG tablet TAKE 1 TABLET BY MOUTH EVERY 12 HOURS. 07/22/23   Wendall Stade, MD  isosorbide mononitrate (IMDUR) 30 MG 24 hr tablet TAKE 1/2 TABLET BY MOUTH DAILY 07/22/23   Wendall Stade, MD  ketoconazole (NIZORAL) 2 % cream APPLY 1 APPLICATION TOPICALLY DAILY AS NEEDED FOR IRRITATION. 03/05/23   Joaquim Nam, MD  nitroGLYCERIN (NITROSTAT) 0.4 MG SL tablet Place 1 tablet (0.4 mg total) under the tongue every 5 (five) minutes as needed for chest pain. Up to 3 doses 07/22/23   Swinyer, Zachary George, NP  polyethylene glycol powder (GLYCOLAX/MIRALAX) 17 GM/SCOOP powder Take 17 g by mouth daily as needed. 07/23/23   Joaquim Nam, MD  rosuvastatin (CRESTOR) 20 MG tablet TAKE 1 TABLET BY MOUTH EVERY DAY 07/22/23   Joaquim Nam, MD  traZODone (DESYREL) 100 MG tablet TAKE 2 TABLETS BY MOUTH AT BEDTIME 07/22/23   Joaquim Nam, MD  XARELTO 20 MG TABS tablet TAKE 1 TABLET BY MOUTH DAILY WITH SUPPER. 05/14/23   Joaquim Nam, MD    Physical Exam: Vitals:   09/14/23 1837 09/14/23 2000 09/14/23 2125 09/14/23 2217  BP:  (!) 141/89 (!) 170/115 (!) 170/104  Pulse:  80  99  Resp:  18  18  Temp: 98.2 F (36.8 C)   97.9 F (36.6 C)  TempSrc: Oral   Oral  SpO2:  96%  94%  Weight:     74.5 kg  Height:    5\' 9"  (1.753 m)   Constitutional: NAD, calm, comfortable, well appearing elderly male lying at approximately 20 degree in bed Eyes: lids and conjunctivae normal ENMT: Mucous membranes are moist.  Neck: normal, supple Respiratory: clear to auscultation bilaterally, no wheezing, no crackles. Normal respiratory effort. No accessory muscle use. On 2L via [place]. Able to speak full sentences.  Cardiovascular: Regular rate and rhythm, no murmurs / rubs / gallops. +3 pitting edema of bilateral LE up to high ankle region  Abdomen: no tenderness, soft Musculoskeletal: no clubbing / cyanosis. No joint deformity upper and lower extremities. Good ROM, no contractures. Normal muscle tone.  Skin: no rashes, lesions, ulcers. No induration Neurologic: CN 2-12 grossly intact. Psychiatric: Normal judgment and insight. Alert and oriented x 3. Normal mood.   Data Reviewed:  See HPI  Assessment and Plan: * Acute on chronic combined systolic and diastolic CHF (congestive heart failure) (HCC) -presented with exertion dyspnea, bilateral pitting LE edema, CXR with bibasilar edema  -Last echocardiogram in 2022 with EF of 45 to 50%, global hypokinesis of the left ventricle, mild concentric LVH.  Left ventricular diastolic parameters indeterminate.  No significant valvular abnormalities. -obtain echocardiogram -daily IV 40mg  Lasix -follow intake and output, daily weights  Acute hypoxic respiratory failure (HCC) -required 2L via [place] on presentation -secondary to acute on chronic combined CHF  -IV diuresis -wean as tolerated with goal >94%.   VTE (venous thromboembolism) Hx of cytogenic stroke/splenic embolism -continue Xarelto  COPD (chronic obstructive pulmonary disease) (HCC) -stable. Not in acute exacerbation   CAD (coronary artery disease) -Continue aspirin  Diabetes mellitus (HCC) -A1C 7.3 in March -SSI  HYPERTENSION, BENIGN -elevated -received IV 20mg  hydralazine in ED   -continue home hydralazine BID -continue Imdur -on IV 40mg  Lasix for diuresis  HYPERCHOLESTEROLEMIA -continue statin and fenofibrate      Advance Care Planning:   Code Status: Full Code   Consults: none  Family Communication: none at bedside  Severity of Illness: The appropriate patient status  for this patient is INPATIENT. Inpatient status is judged to be reasonable and necessary in order to provide the required intensity of service to ensure the patient's safety. The patient's presenting symptoms, physical exam findings, and initial radiographic and laboratory data in the context of their chronic comorbidities is felt to place them at high risk for further clinical deterioration. Furthermore, it is not anticipated that the patient will be medically stable for discharge from the hospital within 2 midnights of admission.   * I certify that at the point of admission it is my clinical judgment that the patient will require inpatient hospital care spanning beyond 2 midnights from the point of admission due to high intensity of service, high risk for further deterioration and high frequency of surveillance required.*  Author: Anselm Jungling, DO 09/14/2023 11:16 PM  For on call review [email or web address].

## 2023-09-14 NOTE — Progress Notes (Signed)
Carelink Summary Report / Loop Recorder 

## 2023-09-14 NOTE — ED Notes (Signed)
Fall risk armband Fall risk sign on door Patient wearing sneekers

## 2023-09-14 NOTE — ED Notes (Signed)
Seen before triage.  Resp laboured at rest moved to room 10, placed on 4lpm [place] then decreased to 2lpm [place].  SpO2 96%.

## 2023-09-14 NOTE — Assessment & Plan Note (Signed)
Uncontrolled hypertension on admission, hypertensive emergency.   Plan to continue diuresis, continue carvedilol and isosorbide/ hydralazine.  Probably addition of ARB on this admission, after de congestion.

## 2023-09-14 NOTE — ED Notes (Signed)
Family at bedside. 

## 2023-09-14 NOTE — Assessment & Plan Note (Addendum)
Hx of cytogenic stroke/splenic embolism Continue anticoagulation with rivaroxaban.,

## 2023-09-14 NOTE — ED Triage Notes (Signed)
Pt to ER with c/o increasing SHOB starting on Friday night.  Pt states has a chronic cough but it is worse than usual.  Denies productive, but is congested.

## 2023-09-14 NOTE — Assessment & Plan Note (Signed)
Continue glucose cover and monitoring with insulin sliding scale.  Continue statin and fenofibrate.

## 2023-09-14 NOTE — Assessment & Plan Note (Signed)
-  continue statin and fenofibrate

## 2023-09-14 NOTE — Assessment & Plan Note (Signed)
No clinical signs of exacerbation.  Continue with bronchodilator therapy as needed and 02 monitoring.

## 2023-09-15 ENCOUNTER — Other Ambulatory Visit (HOSPITAL_COMMUNITY): Payer: Medicare Other

## 2023-09-15 DIAGNOSIS — I10 Essential (primary) hypertension: Secondary | ICD-10-CM | POA: Diagnosis not present

## 2023-09-15 DIAGNOSIS — E785 Hyperlipidemia, unspecified: Secondary | ICD-10-CM

## 2023-09-15 DIAGNOSIS — I2510 Atherosclerotic heart disease of native coronary artery without angina pectoris: Secondary | ICD-10-CM | POA: Diagnosis not present

## 2023-09-15 DIAGNOSIS — J449 Chronic obstructive pulmonary disease, unspecified: Secondary | ICD-10-CM | POA: Diagnosis not present

## 2023-09-15 DIAGNOSIS — E1169 Type 2 diabetes mellitus with other specified complication: Secondary | ICD-10-CM

## 2023-09-15 DIAGNOSIS — I5033 Acute on chronic diastolic (congestive) heart failure: Secondary | ICD-10-CM

## 2023-09-15 LAB — GLUCOSE, CAPILLARY
Glucose-Capillary: 135 mg/dL — ABNORMAL HIGH (ref 70–99)
Glucose-Capillary: 168 mg/dL — ABNORMAL HIGH (ref 70–99)
Glucose-Capillary: 120 mg/dL — ABNORMAL HIGH (ref 70–99)
Glucose-Capillary: 142 mg/dL — ABNORMAL HIGH (ref 70–99)

## 2023-09-15 LAB — MAGNESIUM: Magnesium: 1.8 mg/dL (ref 1.7–2.4)

## 2023-09-15 LAB — BASIC METABOLIC PANEL
Anion gap: 10 (ref 5–15)
BUN: 11 mg/dL (ref 8–23)
CO2: 22 mmol/L (ref 22–32)
Calcium: 8.3 mg/dL — ABNORMAL LOW (ref 8.9–10.3)
Chloride: 107 mmol/L (ref 98–111)
Creatinine, Ser: 1.15 mg/dL (ref 0.61–1.24)
GFR, Estimated: >60 mL/min (ref >60)
Glucose, Bld: 128 mg/dL — ABNORMAL HIGH (ref 70–99)
Potassium: 4.3 mmol/L (ref 3.5–5.1)
Sodium: 139 mmol/L (ref 135–145)

## 2023-09-15 MED ORDER — SPIRONOLACTONE 12.5 MG HALF TABLET
12.5000 mg | ORAL_TABLET | Freq: Every day | ORAL | Status: DC
  Administered 2023-09-15 – 2023-09-18 (×4): 12.5 mg via ORAL
  Filled 2023-09-15 (×4): qty 1

## 2023-09-15 MED ORDER — DORZOLAMIDE HCL-TIMOLOL MAL 2-0.5 % OP SOLN
1.0000 [drp] | Freq: Two times a day (BID) | OPHTHALMIC | Status: DC
  Administered 2023-09-15 – 2023-09-18 (×6): 1 [drp] via OPHTHALMIC
  Filled 2023-09-15: qty 10

## 2023-09-15 MED ORDER — EMPAGLIFLOZIN 10 MG PO TABS
10.0000 mg | ORAL_TABLET | Freq: Every day | ORAL | Status: DC
  Administered 2023-09-15 – 2023-09-18 (×4): 10 mg via ORAL
  Filled 2023-09-15 (×4): qty 1

## 2023-09-15 MED ORDER — FUROSEMIDE 10 MG/ML IJ SOLN
60.0000 mg | Freq: Two times a day (BID) | INTRAMUSCULAR | Status: DC
  Administered 2023-09-15 – 2023-09-16 (×2): 60 mg via INTRAVENOUS
  Filled 2023-09-15 (×2): qty 6

## 2023-09-15 NOTE — Plan of Care (Signed)

## 2023-09-15 NOTE — Progress Notes (Signed)
Progress Note   Patient: Nathan Fernandez HQI:[id number] DOB: 03-27-1941 DOA: 09/14/2023     1 DOS: the patient was seen and examined on 09/15/2023   Brief hospital course: Nathan Fernandez was admitted to the hospital with the working diagnosis of heart failure exacerbation.  82 yo male with the past medical history of heart failure, cryptogenic stroke, COPD, T2DM, coronary artery disease and hyperlipidemia who presented with worsening dyspnea. Reported worsening dyspnea over the last 7 days prior to admission, initially on exertion and then having symptoms with minimal efforts.He also noted lower extremity edema. On his initial physical examination his blood pressure was 186/126, 02 saturation 89% on room air, RR 18 and HR 99, lungs with no wheezing or rhonchi, heart with S1 and S2 present and regular, no gallops, rubs or murmurs, abdomen with no distention and positive lower extremity edema +++.   Na 138, K 4.1 Cl 104, bicarbonate 25, glucose 180, bun 13 cr 0,88 BNP 496 High sensitive troponin 19  Wbc 7,2 hgb 13.4 plt 212  Sars covid 19 negative   Chest radiograph with mild cardiomegaly, bilateral hilar vascular congestion, with bilateral central interstitial infiltrates, predominantly at bases with a small right pleura effusion.   EKG 89 bpm, left axis deviation, right  bundle branch block, sinus rhythm with one non conducting PAC, no significant ST segment or T wave changes.     Assessment and Plan: * Acute on chronic diastolic CHF (congestive heart failure) (HCC) Echocardiogram from 2022 with mild reduction in LV systolic function with EF 45 to 50%, global hypokinesis, mild LVH, low normal RV systolic function, no significant valvular disease.   Urine output 1,800 cc Systolic blood pressure 133 to 149 mmHg.  Continue with signs of hypervolemia.   Plan to continue diuresis with IV furosemide, increase dose to 60 mg IV bid.  Add spironolactone and SGLT 2 inh Continue with carvedilol and  isosorbide.  Follow up on new echocardiogram.   CAD (coronary artery disease) No acute coronary syndrome, continue B blockade and aspirin.  Rosuvastatin.   COPD (chronic obstructive pulmonary disease) (HCC) No clinical signs of exacerbation.  Continue with bronchodilator therapy as needed and 02 monitoring.    Essential hypertension Uncontrolled hypertension on admission, hypertensive emergency.   Plan to continue diuresis, continue carvedilol and isosorbide/ hydralazine.  Probably addition of ARB on this admission, after de congestion.   Type 2 diabetes mellitus with hyperlipidemia (HCC) Continue glucose cover and monitoring with insulin sliding scale.  Continue statin and fenofibrate.   VTE (venous thromboembolism) Hx of cytogenic stroke/splenic embolism Continue anticoagulation with rivaroxaban.,         Subjective: Patient is feeling better but not back to baseline, continue to have dyspnea and wheezing.   Physical Exam: Vitals:   09/15/23 0441 09/15/23 0852 09/15/23 1103 09/15/23 1540  BP: (!) 148/88 139/80 133/83 (!) 149/92  Pulse: (!) 106 (!) 106 85   Resp: 17 18 18 18   Temp: 98.8 F (37.1 C) 98 F (36.7 C) 98.5 F (36.9 C) 98.1 F (36.7 C)  TempSrc: Oral Oral Oral Oral  SpO2: 93% 93% 94%   Weight: 74.5 kg     Height:       Neurology awake and alert ENT with mild pallor Cardiovascular with S1 and S2 present and regular with no gallops, rubs or murmurs Mld JVD Trace lower extremity edema Respiratory with bilateral wheezing and bibasilar rales with no rhonchi Abdomen with no distention   Data Reviewed:  Family Communication: no family at the bedside    Disposition: Status is: Inpatient Remains inpatient appropriate because: IV diuresis, echocardiogram   Planned Discharge Destination: Home   Author: Coralie Keens, MD 09/15/2023 5:19 PM  For on call review [email or web address].

## 2023-09-15 NOTE — Hospital Course (Signed)
Mr. Oldman was admitted to the hospital with the working diagnosis of heart failure exacerbation.  82 yo male with the past medical history of heart failure, cryptogenic stroke, COPD, T2DM, coronary artery disease and hyperlipidemia who presented with worsening dyspnea. Reported worsening dyspnea over the last 7 days prior to admission, initially on exertion and then having symptoms with minimal efforts.He also noted lower extremity edema. On his initial physical examination his blood pressure was 186/126, 02 saturation 89% on room air, RR 18 and HR 99, lungs with no wheezing or rhonchi, heart with S1 and S2 present and regular, no gallops, rubs or murmurs, abdomen with no distention and positive lower extremity edema +++.   Na 138, K 4.1 Cl 104, bicarbonate 25, glucose 180, bun 13 cr 0,88 BNP 496 High sensitive troponin 19  Wbc 7,2 hgb 13.4 plt 212  Sars covid 19 negative   Chest radiograph with mild cardiomegaly, bilateral hilar vascular congestion, with bilateral central interstitial infiltrates, predominantly at bases with a small right pleura effusion.   EKG 89 bpm, left axis deviation, right  bundle branch block, sinus rhythm with one non conducting PAC, no significant ST segment or T wave changes.

## 2023-09-15 NOTE — Progress Notes (Signed)
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 In the ED, he was afebrile BP elevated 186/126 and was hypoxic to 89% and put on 2 L via nasal cannula.BNP of 496.  Troponin of 19. Chest x-ray with bilateral costophrenic angle blunting likely edema. -given IV 20 mg Lasix and a total of 20 mg hydralazine   Subjective: -Feels better, breathing is improving  Assessment and Plan:  Acute on chronic combined systolic and diastolic CHF -presented with exertion dyspnea, bilateral pitting LE edema, CXR with bibasilar edema  -Last echocardiogram in 2022 with EF of 45 to 50%, mild VH, indeterminate diastolic parameters -FU repeat echocardiogram -Volume status improving, 4.5 L negative, weight down 13 LB -Change to p.o. Lasix after today's dose -Continue Jardiance and Aldactone  Acute hypoxic respiratory failure (HCC) -required 2L via Jane on presentation -Wean off O2  VTE (venous thromboembolism) Hx of cytogenic stroke/splenic embolism -continue Xarelto  COPD (chronic obstructive pulmonary disease) (HCC) -stable. Not in acute exacerbation  CAD (coronary artery disease) -Continue aspirin  Diabetes mellitus (HCC) -A1C 7.3 in March -SSI  HYPERTENSION, BENIGN -elevated -continue home hydralazine BID -continue Imdur -on IV 40mg  Lasix for diuresis  HYPERCHOLESTEROLEMIA -continue statin and fenofibrate   DVT prophylaxis: xarelto Code Status: Full Family Communication: None present Disposition Plan: Home tomorrow if stable  Consultants:    Procedures:   Antimicrobials:    Objective: Vitals:   09/15/23 0441 09/15/23 0852 09/15/23 1103 09/15/23 1540  BP: (!) 148/88 139/80 133/83 (!)  149/92  Pulse: (!) 106 (!) 106 85   Resp: 17 18 18 18   Temp: 98.8 F (37.1 C) 98 F (36.7 C) 98.5 F (36.9 C) 98.1 F (36.7 C)  TempSrc: Oral Oral Oral Oral  SpO2: 93% 93% 94%   Weight: 74.5 kg     Height:        Intake/Output Summary (Last 24 hours) at 09/15/2023 1706 Last data filed at 09/15/2023 1540 Gross per 24 hour  Intake 970 ml  Output 1875 ml  Net -905 ml   Filed Weights   09/14/23 1132 09/14/23 2217 09/15/23 0441  Weight: 77.1 kg 74.5 kg 74.5 kg    Examination:  General exam: Appears calm and comfortable  Respiratory system: Clear to auscultation Cardiovascular system: S1 & S2 heard, RRR.  Abd: nondistended, soft and nontender.Normal bowel sounds heard. Central nervous system: Alert and oriented. No focal neurological deficits. Extremities: no edema Skin: No rashes Psychiatry:  Mood & affect appropriate.     Data Reviewed:   CBC: Recent Labs  Lab 09/14/23 1148  WBC 7.2  NEUTROABS 5.2  HGB 13.4  HCT 39.6  MCV 90.8  PLT 212   Basic Metabolic Panel: Recent Labs  Lab 09/14/23 1148 09/15/23 0800  NA 138 139  K 4.1 4.3  CL 104 107  CO2 26 22  GLUCOSE 180* 128*  BUN 13 11  CREATININE 0.88 1.15  CALCIUM 8.2* 8.3*  MG  --  1.8   GFR: Estimated Creatinine Clearance: 48.7 mL/min (by C-G formula based on SCr of 1.15 mg/dL). Liver Function Tests: Recent Labs  Lab 09/14/23 1148  AST 26  ALT 19  ALKPHOS 48  BILITOT 0.8  PROT 5.7*  ALBUMIN 3.0*   No results  for input(s): "LIPASE", "AMYLASE" in the last 168 hours. No results for input(s): "AMMONIA" in the last 168 hours. Coagulation Profile: No results for input(s): "INR", "PROTIME" in the last 168 hours. Cardiac Enzymes: No results for input(s): "CKTOTAL", "CKMB", "CKMBINDEX", "TROPONINI" in the last 168 hours. BNP (last 3 results) No results for input(s): "PROBNP" in the last 8760 hours. HbA1C: No results for input(s): "HGBA1C" in the last 72 hours. CBG: Recent Labs  Lab  09/15/23 0603 09/15/23 1101 09/15/23 1538  GLUCAP 135* 168* 120*   Lipid Profile: No results for input(s): "CHOL", "HDL", "LDLCALC", "TRIG", "CHOLHDL", "LDLDIRECT" in the last 72 hours. Thyroid Function Tests: No results for input(s): "TSH", "T4TOTAL", "FREET4", "T3FREE", "THYROIDAB" in the last 72 hours. Anemia Panel: No results for input(s): "VITAMINB12", "FOLATE", "FERRITIN", "TIBC", "IRON", "RETICCTPCT" in the last 72 hours. Urine analysis:    Component Value Date/Time   COLORURINE YELLOW 04/04/2020 1748   APPEARANCEUR CLEAR 04/04/2020 1748   LABSPEC >1.030 (H) 04/04/2020 1748   PHURINE 6.0 04/04/2020 1748   GLUCOSEU 100 (A) 04/04/2020 1748   HGBUR TRACE (A) 04/04/2020 1748   HGBUR trace-lysed 08/17/2009 1039   BILIRUBINUR NEGATIVE 04/04/2020 1748   BILIRUBINUR negative 07/14/2018 1001   KETONESUR NEGATIVE 04/04/2020 1748   PROTEINUR >300 (A) 04/04/2020 1748   UROBILINOGEN 0.2 07/14/2018 1001   UROBILINOGEN 0.2 08/17/2009 1039   NITRITE NEGATIVE 04/04/2020 1748   LEUKOCYTESUR NEGATIVE 04/04/2020 1748   Sepsis Labs: @LABRCNTIP (procalcitonin:4,lacticidven:4)  ) Recent Results (from the past 240 hour(s))  SARS Coronavirus 2 by RT PCR (hospital order, performed in [facility] hospital lab) *cepheid single result test* Anterior Nasal Swab     Status: None   Collection Time: 09/14/23 11:48 AM   Specimen: Anterior Nasal Swab  Result Value Ref Range Status   SARS Coronavirus 2 by RT PCR NEGATIVE NEGATIVE Final    Comment: (NOTE) SARS-CoV-2 target nucleic acids are NOT DETECTED.  The SARS-CoV-2 RNA is generally detectable in upper and lower respiratory specimens during the acute phase of infection. The lowest concentration of SARS-CoV-2 viral copies this assay can detect is 250 copies / mL. A negative result does not preclude SARS-CoV-2 infection and should not be used as the sole basis for treatment or other patient management decisions.  A negative result may occur  with improper specimen collection / handling, submission of specimen other than nasopharyngeal swab, presence of viral mutation(s) within the areas targeted by this assay, and inadequate number of viral copies (<250 copies / mL). A negative result must be combined with clinical observations, patient history, and epidemiological information.  Fact Sheet for Patients:   RoadLapTop.co.za  Fact Sheet for Healthcare Providers: http://kim-miller.com/  This test is not yet approved or  cleared by the [place] FDA and has been authorized for detection and/or diagnosis of SARS-CoV-2 by FDA under an Emergency Use Authorization (EUA).  This EUA will remain in effect (meaning this test can be used) for the duration of the COVID-19 declaration under Section 564(b)(1) of the Act, 21 U.S.C. section 360bbb-3(b)(1), unless the authorization is terminated or revoked sooner.  Performed at [facility], [address] [place], [place] [address]      Radiology Studies: DG Chest [place] 1 View  Result Date: 09/14/2023 CLINICAL DATA:  Shortness of breath. EXAM: PORTABLE CHEST 1 VIEW COMPARISON:  February 11, 2021. FINDINGS: Stable cardiomediastinal silhouette. Mild bibasilar opacities are noted concerning for edema or possibly subsegmental atelectasis. Bony thorax is unremarkable. IMPRESSION: Mild bibasilar edema or  subsegmental atelectasis. Minimal pleural effusions may be present. Electronically Signed   By: Lupita Raider M.D.   On: 09/14/2023 13:53     Scheduled Meds:  aspirin EC  81 mg Oral Daily   buPROPion  150 mg Oral QHS   carvedilol  6.25 mg Oral BID WC   fenofibrate  54 mg Oral Daily   furosemide  40 mg Intravenous Daily   hydrALAZINE  10 mg Oral Q12H   insulin aspart  0-9 Units Subcutaneous TID WC   isosorbide mononitrate  15 mg Oral Daily   rivaroxaban  20 mg Oral Q supper   rosuvastatin  20 mg Oral Daily   traZODone  200 mg  Oral QHS   Continuous Infusions:   LOS: 1 day    Time spent:    Zannie Cove, MD [facility]   09/15/2023, 5:06 PM

## 2023-09-15 NOTE — Plan of Care (Signed)
Problem: Education: Goal: Knowledge of General Education information will improve Description: Including pain rating scale, medication(s)/side effects and non-pharmacologic comfort measures Outcome: Progressing   Problem: Clinical Measurements: Goal: Ability to maintain clinical measurements within normal limits will improve Outcome: Progressing Goal: Will remain free from infection Outcome: Progressing

## 2023-09-16 ENCOUNTER — Telehealth: Payer: Self-pay | Admitting: Cardiovascular Disease

## 2023-09-16 ENCOUNTER — Encounter (HOSPITAL_COMMUNITY): Payer: Self-pay | Admitting: Internal Medicine

## 2023-09-16 ENCOUNTER — Inpatient Hospital Stay (HOSPITAL_COMMUNITY): Payer: Medicare Other

## 2023-09-16 DIAGNOSIS — I25118 Atherosclerotic heart disease of native coronary artery with other forms of angina pectoris: Secondary | ICD-10-CM

## 2023-09-16 DIAGNOSIS — R0602 Shortness of breath: Secondary | ICD-10-CM

## 2023-09-16 DIAGNOSIS — I5021 Acute systolic (congestive) heart failure: Secondary | ICD-10-CM | POA: Diagnosis not present

## 2023-09-16 DIAGNOSIS — I639 Cerebral infarction, unspecified: Secondary | ICD-10-CM

## 2023-09-16 DIAGNOSIS — I5033 Acute on chronic diastolic (congestive) heart failure: Secondary | ICD-10-CM | POA: Diagnosis not present

## 2023-09-16 LAB — GLUCOSE, CAPILLARY
Glucose-Capillary: 119 mg/dL — ABNORMAL HIGH (ref 70–99)
Glucose-Capillary: 181 mg/dL — ABNORMAL HIGH (ref 70–99)
Glucose-Capillary: 103 mg/dL — ABNORMAL HIGH (ref 70–99)
Glucose-Capillary: 206 mg/dL — ABNORMAL HIGH (ref 70–99)

## 2023-09-16 LAB — BASIC METABOLIC PANEL
Anion gap: 10 (ref 5–15)
BUN: 16 mg/dL (ref 8–23)
CO2: 26 mmol/L (ref 22–32)
Calcium: 8.5 mg/dL — ABNORMAL LOW (ref 8.9–10.3)
Chloride: 100 mmol/L (ref 98–111)
Creatinine, Ser: 1.26 mg/dL — ABNORMAL HIGH (ref 0.61–1.24)
GFR, Estimated: 57 mL/min — ABNORMAL LOW (ref >60)
Glucose, Bld: 118 mg/dL — ABNORMAL HIGH (ref 70–99)
Potassium: 3.6 mmol/L (ref 3.5–5.1)
Sodium: 136 mmol/L (ref 135–145)

## 2023-09-16 LAB — MAGNESIUM: Magnesium: 1.7 mg/dL (ref 1.7–2.4)

## 2023-09-16 LAB — ECHOCARDIOGRAM COMPLETE
Height: 69 in
S' Lateral: 4.6 cm
Single Plane A4C EF: 35.9 %
Weight: 2525.59 oz

## 2023-09-16 MED ORDER — POTASSIUM CHLORIDE CRYS ER 20 MEQ PO TBCR
40.0000 meq | EXTENDED_RELEASE_TABLET | Freq: Once | ORAL | Status: AC
  Administered 2023-09-16: 40 meq via ORAL
  Filled 2023-09-16: qty 2

## 2023-09-16 MED ORDER — SODIUM CHLORIDE 0.9 % IV SOLN: INTRAVENOUS | Status: DC | PRN

## 2023-09-16 MED ORDER — MAGNESIUM SULFATE 2 GM/50ML IV SOLN
2.0000 g | Freq: Once | INTRAVENOUS | Status: AC
  Administered 2023-09-16: 2 g via INTRAVENOUS
  Filled 2023-09-16: qty 50

## 2023-09-16 MED ORDER — FUROSEMIDE 10 MG/ML IJ SOLN
40.0000 mg | Freq: Two times a day (BID) | INTRAMUSCULAR | Status: AC
  Administered 2023-09-16: 40 mg via INTRAVENOUS
  Filled 2023-09-16: qty 4

## 2023-09-16 NOTE — Plan of Care (Signed)
Alert and oriented. Non-sustained V-tach x2. New orders received for IV mag, oral KCL.  Patient educated on plan of care.   Problem: Education: Goal: Knowledge of General Education information will improve Description: Including pain rating scale, medication(s)/side effects and non-pharmacologic comfort measures Outcome: Progressing   Problem: Health Behavior/Discharge Planning: Goal: Ability to manage health-related needs will improve Outcome: Progressing   Problem: Clinical Measurements: Goal: Ability to maintain clinical measurements within normal limits will improve Outcome: Progressing Goal: Will remain free from infection Outcome: Progressing Goal: Diagnostic test results will improve Outcome: Progressing Goal: Respiratory complications will improve Outcome: Progressing Goal: Cardiovascular complication will be avoided Outcome: Progressing   Problem: Activity: Goal: Risk for activity intolerance will decrease Outcome: Progressing   Problem: Nutrition: Goal: Adequate nutrition will be maintained Outcome: Progressing   Problem: Coping: Goal: Level of anxiety will decrease Outcome: Progressing   Problem: Elimination: Goal: Will not experience complications related to bowel motility Outcome: Progressing Goal: Will not experience complications related to urinary retention Outcome: Progressing   Problem: Pain Managment: Goal: General experience of comfort will improve Outcome: Progressing   Problem: Safety: Goal: Ability to remain free from injury will improve Outcome: Progressing   Problem: Skin Integrity: Goal: Risk for impaired skin integrity will decrease Outcome: Progressing   Problem: Education: Goal: Ability to describe self-care measures that may prevent or decrease complications (Diabetes Survival Skills Education) will improve Outcome: Progressing Goal: Individualized Educational Video(s) Outcome: Progressing   Problem: Coping: Goal: Ability to  adjust to condition or change in health will improve Outcome: Progressing   Problem: Fluid Volume: Goal: Ability to maintain a balanced intake and output will improve Outcome: Progressing   Problem: Health Behavior/Discharge Planning: Goal: Ability to identify and utilize available resources and services will improve Outcome: Progressing Goal: Ability to manage health-related needs will improve Outcome: Progressing   Problem: Metabolic: Goal: Ability to maintain appropriate glucose levels will improve Outcome: Progressing   Problem: Nutritional: Goal: Maintenance of adequate nutrition will improve Outcome: Progressing Goal: Progress toward achieving an optimal weight will improve Outcome: Progressing   Problem: Skin Integrity: Goal: Risk for impaired skin integrity will decrease Outcome: Progressing   Problem: Tissue Perfusion: Goal: Adequacy of tissue perfusion will improve Outcome: Progressing

## 2023-09-16 NOTE — Progress Notes (Signed)
Heart Failure Nurse Navigator Progress Note  PCP: Joaquim Nam, MD PCP-Cardiologist: Eden Emms Admission Diagnosis: Acute on chronic systolic congestive heart failure, acute respiratory failure with hypoxia.  Admitted from: Home  Presentation:   Nathan Fernandez presented with increasing shortness of breath, has chronic cough, however is currently congested, 2+ BLE edema up to his knees, BP 165/103, HR 72, BNP 496, CXR with mild edema,   Patient was educated on the sign and symptoms of heart failure, daily weights, when to call his doctor or go to the ED, diet/ fluid restrictions, taking all medications as prescribed and attending all medical appointments. Patient verbalized his understanding, a HF TOC appointment was scheduled for 09/29/2023 @ 12 noon.   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   Hosp R facial weakness   CAD (coronary artery disease)    MI, Hosp 1993, PTCA; LHC 04/18/13: Distal left main 40%, proximal LAD with several aneurysmal segments, proximal LAD 50% prior to and after aneurysmal segments, area does not appear to flow-limiting, proximal diagonal 70%, ostial circumflex 20%, proximal circumflex 20%, OM1 30-40%, proximal RCA 40%, mid RCA 30%, distal RCA 20%, EF 50% => med Rx   Cancer (HCC)    skin cancer - 2020   COPD (chronic obstructive pulmonary disease) (HCC)    DM2 (diabetes mellitus, type 2) (HCC)    DVT (deep venous thrombosis) (HCC) 12/2021   History of ETT 1998   wnl   History of hiatal hernia    History of MRI 08/04/2009   brain- atrophy sm vess dz   HLD (hyperlipidemia)    HTN (hypertension)    Myocardial infarction (HCC)    OA (osteoarthritis)    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HCC)    05/09/12 [place]: 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, large collateral proximal to aneurysm   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      Social History   Socioeconomic History   Marital status: Married    Spouse name: Not on file   Number of children: 4   Years of education: Not on file   Highest education level: Not on file  Occupational History   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[employer]    Comment: for [employer] with [employer]   Occupation: retired  Tobacco Use   Smoking status: Former    Types: Cigars   Smokeless tobacco: Never   Tobacco comments:    cigar occassionally  Vaping Use   Vaping status: Never Used  Substance and Sexual Activity   Alcohol use: Yes    Comment: occasional   Drug use: No   Sexual activity: Not on file  Other Topics Concern   Not on file  Social History Narrative   Divorced, lives with partner Steward Drone)- married 09/2015   3 living children, had a daughter who died at age 31 in 2023-11-19   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, retired as of 2019   Social Determinants of Health   Financial Resource Strain: Low Risk  (08/07/2022)   Overall Financial Resource Strain (CARDIA)    Difficulty of Paying Living Expenses: Not hard at all  Food Insecurity: No Food Insecurity (09/14/2023)   Hunger Vital Sign    Worried About Running Out of Food in the Last Year: Never true    Ran Out of Food in the Last Year: Never true  Transportation Needs: No Transportation Needs (09/14/2023)   PRAPARE - Administrator, Civil Service (Medical): No    Lack of Transportation (Non-Medical): No  Physical Activity: Inactive (08/07/2022)   Exercise Vital Sign    Days of Exercise per Week: 0 days    Minutes of Exercise per Session: 0 min  Stress: No Stress Concern Present (08/07/2022)   [employer] of Occupational Health - Occupational Stress Questionnaire    Feeling of Stress : Not at all  Social Connections: Socially Integrated (08/07/2022)   Social Connection and Isolation Panel [NHANES]    Frequency of Communication with Friends and Family: More than three times a  week    Frequency of Social Gatherings with Friends and Family: More than three times a week    Attends Religious Services: More than 4 times per year    Active Member of [employer] or Organizations: Yes    Attends Engineer, structural: More than 4 times per year    Marital Status: Married   Water engineer and Provision:  Detailed education and instructions provided on heart failure disease management including the following:  Signs and symptoms of Heart Failure When to call the physician Importance of daily weights Low sodium diet Fluid restriction Medication management Anticipated future follow-up appointments  Patient education given on each of the above topics.  Patient acknowledges understanding via teach back method and acceptance of all instructions.  Education Materials:  "Living Better With Heart Failure" Booklet, HF zone tool, & Daily Weight Tracker Tool.  Patient has scale at home: yes Patient has pill box at home: yes    High Risk Criteria for Readmission and/or Poor Patient Outcomes: Heart failure hospital admissions (last 6 months): 1  No Show rate: 1% Difficult social situation: No, lives with wife Demonstrates medication adherence: " reported sometimes"  Primary Language: English, HOH Literacy level: Reading, writing, and comprehension  Barriers of Care:   Diet/ fluid restrictions Daily weights  Considerations/Referrals:   Referral made to Heart Failure Pharmacist Stewardship: yes Referral made to Heart Failure CSW/NCM TOC: No Referral made to Heart & Vascular TOC clinic: yes, 09/29/2023 @ 12 noon  Items for Follow-up on DC/TOC: Continued HF education Diet/ fluid restrictions Daily weights   Rhae Hammock, BSN, RN Heart Failure Teacher, adult education Only

## 2023-09-16 NOTE — Progress Notes (Signed)
  Echocardiogram 2D Echocardiogram has been performed.  Nathan Fernandez 09/16/2023, 1:31 PM

## 2023-09-16 NOTE — Telephone Encounter (Signed)
Patient's wife calling because she wants Dr. Eden Emms to know that her husband is in the hospital. She states that they plan on releasing him but she feels that there needs to be more done before he is released. Advised her that Dr. Eden Emms was on vacation this week but would forward to him and his nurse.

## 2023-09-16 NOTE — Evaluation (Signed)
Occupational Therapy Evaluation Patient Details Name: Nathan Fernandez MRN: [id number] DOB: [birth year] Today's Date: 09/16/2023   History of Present Illness Patient is a 82 year old male with acute on chronic diastolic CHF, dyspnea. History of heart failure, cryptogenic stroke, COPD, T2DM, coronary artery disease and hyperlipidemia.   Clinical Impression   At baseline, pt is Independent with all ADLs, IADLs, and functional mobility. Pt now presents with decreased activity tolerance and cardiopulmonary status affecting safety and independence with functional tasks. Pt currently demonstrates ability to complete ADLs Independent to Supervision and functional transfers/mobility with Supervision to Contact guard assist for safety secondary to O2 sat dropping with activity, This session, pt's O2 sat dropping into mid-80s on RA with activity in sitting and standing with O2 sat recovering quickly to >/90% on RA with seated rest break and pursed lip breathing. All other VSS on RA throughout session. Pt will benefit from acute skilled OT services to address deficits outlined below and increase safety and independence with functional tasks. Post acute discharge, no OT follow up is indicated at this time.       If plan is discharge home, recommend the following: A little help with walking and/or transfers;A little help with bathing/dressing/bathroom;Assistance with cooking/housework;Assist for transportation;Help with stairs or ramp for entrance    Functional Status Assessment  Patient has had a recent decline in their functional status and demonstrates the ability to make significant improvements in function in a reasonable and predictable amount of time.  Equipment Recommendations  BSC/3in1    Recommendations for Other Services       Precautions / Restrictions Precautions Precautions: Fall Restrictions Weight Bearing Restrictions: No      Mobility Bed Mobility Overal bed mobility: Modified  Independent             General bed mobility comments: supine<->sit with HOB elevated    Transfers Overall transfer level: Needs assistance Equipment used: None Transfers: Sit to/from Stand, Bed to chair/wheelchair/BSC Sit to Stand: Supervision     Step pivot transfers: Supervision, Contact guard assist     General transfer comment: Supervision to Contact guard assist for safety secondary to decreased activity tolerance and O2 sat dropping with activity.      Balance Overall balance assessment: Needs assistance Sitting-balance support: Feet supported Sitting balance-Leahy Scale: Good     Standing balance support: No upper extremity supported, During functional activity Standing balance-Leahy Scale: Fair                             ADL either performed or assessed with clinical judgement   ADL Overall ADL's : Needs assistance/impaired Eating/Feeding: Independent;Sitting   Grooming: Independent;Sitting   Upper Body Bathing: Supervision/ safety;Sitting   Lower Body Bathing: Supervison/ safety;Sit to/from stand   Upper Body Dressing : Independent;Sitting   Lower Body Dressing: Supervision/safety;Sit to/from stand   Toilet Transfer: Supervision/safety;Contact guard Engineer, agricultural and Hygiene: Supervision/safety;Contact guard assist;Sit to/from stand   Retail buyer: Supervision/safety;Shower seat;Ambulation   Functional mobility during ADLs: Supervision/safety;Contact guard assist General ADL Comments: Supervision to Contact guard assist for safety secondary to decreased activity tolerance and O2 sat dropping with activity.     Vision Baseline Vision/History: 1 Wears glasses (readers) Ability to See in Adequate Light: 0 Adequate Patient Visual Report: No change from baseline       Perception         Praxis  Pertinent Vitals/Pain Pain Assessment Pain Assessment: 0-10 Pain  Score: 5  Pain Location: Lower back and Left hip- pt reports chroic and at baseline Pain Descriptors / Indicators: Constant, Aching Pain Intervention(s): Monitored during session, Repositioned, Limited activity within patient's tolerance     Extremity/Trunk Assessment Upper Extremity Assessment Upper Extremity Assessment: Right hand dominant;Overall [facility] for tasks assessed   Lower Extremity Assessment Lower Extremity Assessment: Defer to PT evaluation   Cervical / Trunk Assessment Cervical / Trunk Assessment: Normal   Communication Communication Communication: Hearing impairment (otherwise WNL)   Cognition Arousal: Alert Behavior During Therapy: WFL for tasks assessed/performed Overall Cognitive Status: Within Functional Limits for tasks assessed                                 General Comments: AAOx4 and pleasant throughout session. Able to follow multi-step commands consistently.     General Comments  O2 sat dropping into mid-80s on RA with activity in sitting and standing with O2 sat recovering quickly to >/90% on RA with seated rest break and pursed lip breathing. All other VSS on RA throughout session. Pt's wife and case manager each present during a portion of session.     Exercises     Shoulder Instructions      Home Living Family/patient expects to be discharged to:: Private residence Living Arrangements: Spouse/significant other Available Help at Discharge: Family;Available 24 hours/day Type of Home: House Home Access: Stairs to enter [employer] of Steps: 3 Entrance Stairs-Rails: Can reach both Home Layout: One level     Bathroom Shower/Tub: Chief Strategy Officer: Standard Bathroom Accessibility: Yes How Accessible: Accessible via walker Home Equipment: Cane - single point;Rolling Walker (2 wheels);Other (comment) (suction cup grab bar in tub/shower)          Prior Functioning/Environment Prior Level of Function :  Independent/Modified Independent             Mobility Comments: patient reports 2 falls this year but not recent. he checks his Sp02 at home which is typically 94-96%. spouse has been driving recently ADLs Comments: independent per patient report        OT Problem List: Decreased activity tolerance;Impaired balance (sitting and/or standing);Cardiopulmonary status limiting activity      OT Treatment/Interventions: Self-care/ADL training;Therapeutic exercise;Energy conservation;DME and/or AE instruction;Therapeutic activities;Patient/family education;Balance training    OT Goals(Current goals can be found in the care plan section) Acute Rehab OT Goals Patient Stated Goal: to return home and remain independent OT Goal Formulation: With patient Time For Goal Achievement: 09/30/23 Potential to Achieve Goals: Good ADL Goals Pt Will Perform Upper Body Bathing: with modified independence;standing;sitting Pt Will Perform Lower Body Bathing: with modified independence;sit to/from stand Pt Will Transfer to Toilet: Independently;ambulating;regular height toilet Pt/caregiver will Perform Home Exercise Program: Both right and left upper extremity;With theraband;Independently;With written HEP provided (Increased activity tolerance) Additional ADL Goal #1: Patient will demonstrate ability to Independently state 3 energy conservation strategies for increased safety and independence with funcitonal tasks in the home and community.  OT Frequency: Min 1X/week    Co-evaluation              AM-PAC OT "6 Clicks" Daily Activity     Outcome Measure Help from another person eating meals?: None Help from another person taking care of personal grooming?: None (in sitting) Help from another person toileting, which includes using toliet, bedpan, or urinal?: A Little Help from  another person bathing (including washing, rinsing, drying)?: A Little Help from another person to put on and taking off regular  upper body clothing?: None Help from another person to put on and taking off regular lower body clothing?: A Little 6 Click Score: 21   End of Session Nurse Communication: Mobility status  Activity Tolerance: Patient tolerated treatment well;Treatment limited secondary to medical complications (Comment) (Pt limited by decreased O2 sat with activity.) Patient left: in bed;with call bell/phone within reach;with family/visitor present  OT Visit Diagnosis: Other (comment) (Decreased activity tolerance)                Time: 8119-1478 OT Time Calculation (min): 29 min Charges:  OT General Charges $OT Visit: 1 Visit OT Evaluation $OT Eval Low Complexity: 1 Low OT Treatments $Self Care/Home Management : 8-22 mins  Charlet Harr "Orson Eva., OTR/L, MA Acute Rehab [phone]   Lendon Colonel 09/16/2023, 3:04 PM

## 2023-09-16 NOTE — TOC Initial Note (Addendum)
Transition of Care [facility]) - Initial/Assessment Note    Patient Details  Name: Nathan Fernandez MRN: [id number] Date of Birth: Dec 04, 1941  [facility] CM/SW Contact:    Leone Haven, RN Phone Number: 09/16/2023, 2:35 PM  Clinical Narrative:                 From home with spouse, has PCP and insurance on file, states has no HH services in place at this time , has walker and cane at home but do not use them.  Per pt eval rec HHPT, NCM offered choice to patient , he states he does not want inpt rehab at home, he would like to be set up for outpt physical therapy and Occupational therapy.  He states he was getting this before    States wife  will transport him home at [employer] and wife is support system, states [facility] [address]  Pta self ambulatory.  Wife is at the bedside she says he was getting outpt pt [facility] and [facility]. They would also like to have a BSC, has no preference of agency. Rep at [facility] and [facility] says need a note from doctor stating it is ok for him to resume his physical therapy with them.  NCM notified Dr. Jomarie Longs, will remind her tomorrow also since plan for dc tomorrow. Delbert Harness fax number is [phone].  Expected Discharge Plan: Home w Home Health Services Barriers to Discharge: Continued Medical Work up   Patient Goals and CMS Choice Patient states their goals for this hospitalization and ongoing recovery are:: return home CMS Medicare.gov Compare Post Acute Care list provided to:: Patient Choice offered to / list presented to : Patient      Expected Discharge Plan and Services In-house Referral: NA Discharge Planning Services: CM Consult Post Acute Care Choice: Home Health Living arrangements for the past 2 months: Single Family Home                 DME Arranged: N/A DME Agency: NA       HH Arranged: NA          Prior Living Arrangements/Services Living arrangements for the past 2 months: Single Family  Home Lives with:: Spouse Patient language and need for interpreter reviewed:: Yes Do you feel safe going back to the place where you live?: Yes      Need for Family Participation in Patient Care: Yes (Comment) Care giver support system in place?: Yes (comment) Current home services: DME (walker, cane) Criminal Activity/Legal Involvement Pertinent to Current Situation/Hospitalization: No - Comment as needed  Activities of Daily Living Home Assistive Devices/Equipment: Dentures (specify type), Cane (specify quad or straight), Walker (specify type) ADL Screening (condition at time of admission) Does the patient have a NEW difficulty with bathing/dressing/toileting/self-feeding that is expected to last >3 days?: No Does the patient have a NEW difficulty with getting in/out of bed, walking, or climbing stairs that is expected to last >3 days?: No Does the patient have a NEW difficulty with communication that is expected to last >3 days?: No Is the patient deaf or have difficulty hearing?: Yes Does the patient have difficulty seeing, even when wearing glasses/contacts?: No Does the patient have difficulty concentrating, remembering, or making decisions?: No  Permission Sought/Granted Permission sought to share information with : Case Manager Permission granted to share information with : Yes, Verbal Permission Granted  Emotional Assessment Appearance:: Appears stated age Attitude/Demeanor/Rapport: Engaged Affect (typically observed): Appropriate Orientation: : Oriented to Self, Oriented to Place, Oriented to  Time, Oriented to Situation Alcohol / Substance Use: Not Applicable Psych Involvement: No (comment)  Admission diagnosis:  Acute on chronic systolic congestive heart failure (HCC) [I50.23] Acute respiratory failure with hypoxia (HCC) [J96.01] Heart failure (HCC) [I50.9] Patient Active Problem List   Diagnosis Date Noted   Heart failure (HCC) 09/14/2023   Acute on  chronic diastolic CHF (congestive heart failure) (HCC) 09/14/2023   VTE (venous thromboembolism) 09/14/2023   Acute hypoxic respiratory failure (HCC) 09/14/2023   Disorder of foreskin 08/05/2023   Left sided abdominal pain 08/05/2023   Foreskin problem 03/11/2023   Inguinal pain 05/04/2022   Leg swelling 05/04/2022   DVT (deep venous thrombosis) (HCC) 12/2021   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/19/2021   Non-ST elevation MI (NSTEMI) (HCC) 02/12/2021   NSTEMI (non-ST elevated myocardial infarction) (HCC) 02/12/2021   Long term (current) use of anticoagulants 05/27/2020   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   Hemoptysis 07/10/2019   Abnormal cardiovascular stress test    Tremor 09/02/2018   Advance care planning 09/02/2018   Cold intolerance 09/02/2018   Dry mouth 03/12/2018   Shoulder pain 01/05/2018   Hypoxia 12/29/2017   Wrist pain 12/29/2017   Lower back pain 02/18/2017   Lower urinary tract symptoms (LUTS) 02/11/2016   Bilateral knee pain 05/25/2015   Benign paroxysmal positional vertigo 02/18/2015   Abdominal wall symptom 02/18/2015   Gout 05/04/2014   Healthcare maintenance 04/18/2014   COPD (chronic obstructive pulmonary disease) (HCC) 03/25/2013   CAD (coronary artery disease) 03/03/2013   Other malaise and fatigue 02/22/2013   Chest wall pain 05/31/2012   Heartburn 05/07/2011   Depression 11/28/2010   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 [place] [address] Phone:  [phone] Fax: [phone]     Social Determinants of Health (SDOH) Social History: SDOH Screenings   Food Insecurity: No Food Insecurity (09/14/2023)  Housing: Low Risk  (09/16/2023)  Transportation Needs: No Transportation Needs (09/16/2023)  Utilities: Not At Risk (09/14/2023)  Alcohol Screen: Low Risk  (09/16/2023)  Depression (PHQ2-9): Low Risk  (08/03/2023)  Financial Resource Strain: Low Risk  (09/16/2023)  Physical Activity: Inactive (08/07/2022)  Social Connections: Socially Integrated (08/07/2022)  Stress: No Stress Concern Present (08/07/2022)  Tobacco Use: Medium Risk (09/14/2023)   SDOH Interventions: Housing Interventions: Intervention Not Indicated Transportation Interventions: Intervention Not Indicated Alcohol Usage Interventions: Intervention Not Indicated (Score <7) Financial Strain Interventions: Intervention Not Indicated   Readmission Risk Interventions     No data to display

## 2023-09-16 NOTE — Evaluation (Addendum)
Physical Therapy Evaluation Patient Details Name: Nathan Fernandez MRN: [id number] DOB: March 10, 1941 Today's Date: 09/16/2023  History of Present Illness  Patient is a 82 year old male with acute on chronic diastolic CHF, dyspnea. History of heart failure, cryptogenic stroke, COPD, T2DM, coronary artery disease and hyperlipidemia.   Clinical Impression  Patient is agreeable to PT evaluation. He was seated on edge of bed without oxygen. He reports he is independent at baseline and ambulates without assistive device. He lives with his spouse. Today, patient had mild unsteadiness with ambulation that is self corrected without assistive device. Mild dyspnea at rest with cues for breathing techniques and tips for energy conservation to use in home setting. Recommend PT follow up to maximize independence and decrease caregiver burden. Patient is hopeful to return home soon without oxygen if possible.   Sp02 92% on room air at rest.  Sp02 84% on room air with ambulation. Sp02 90% on 2 L02 with ambulation.      If plan is discharge home, recommend the following: Assist for transportation;Help with stairs or ramp for entrance   Can travel by private vehicle        Equipment Recommendations None recommended by PT  Recommendations for Other Services       Functional Status Assessment Patient has had a recent decline in their functional status and demonstrates the ability to make significant improvements in function in a reasonable and predictable amount of time.     Precautions / Restrictions Precautions Precautions: Fall Restrictions Weight Bearing Restrictions: No      Mobility  Bed Mobility Overal bed mobility: Modified Independent             General bed mobility comments: sitting to supine with HOB elevated    Transfers Overall transfer level: Needs assistance Equipment used: None Transfers: Sit to/from Stand Sit to Stand: Supervision           General transfer  comment: supervision for safety. 2 standing bouts performed    Ambulation/Gait Ambulation/Gait assistance: Contact guard assist, Supervision Gait Distance (Feet):  (65ft, 17ft) Assistive device: None Gait Pattern/deviations: Step-through pattern, Drifts right/left Gait velocity: decreased     General Gait Details: occasional drifting to the left that is self corrected. patient ambulated on room air with Sp02 at 84%. on 2 L02, Sp02 90% with ambulation. mild dyspnea noted with cues for breathing techniques. energy conservation strategies discussed to use in the home setting. patient reports he checks Sp02 at home with typical range between 94%-96%  Stairs            Wheelchair Mobility     Tilt Bed    Modified Rankin (Stroke Patients Only)       Balance Overall balance assessment: Needs assistance Sitting-balance support: Feet supported Sitting balance-Leahy Scale: Good     Standing balance support: No upper extremity supported Standing balance-Leahy Scale: Fair                               Pertinent Vitals/Pain Pain Assessment Pain Assessment: No/denies pain    Home Living Family/patient expects to be discharged to:: Private residence Living Arrangements: Spouse/significant other Available Help at Discharge: Family Type of Home: House Home Access: Stairs to enter   Secretary/administrator of Steps: 3   Home Layout: One level Home Equipment: Cane - single Librarian, academic (2 wheels)      Prior Function Prior Level of Function : Independent/Modified Independent  Mobility Comments: patient reports 2 falls this year but not recent. he checks his Sp02 at home which is typically 94-96%. spouse has been driving recently ADLs Comments: independent per patient report     Extremity/Trunk Assessment   Upper Extremity Assessment Upper Extremity Assessment: Overall WFL for tasks assessed    Lower Extremity Assessment Lower  Extremity Assessment: Generalized weakness (endurance impaired for sustained activity in standing)       Communication   Communication Communication: No apparent difficulties  Cognition Arousal: Alert Behavior During Therapy: WFL for tasks assessed/performed Overall Cognitive Status: Within Functional Limits for tasks assessed                                          General Comments      Exercises     Assessment/Plan    PT Assessment Patient needs continued PT services  PT Problem List Decreased strength;Decreased activity tolerance;Decreased balance;Decreased mobility;Decreased safety awareness;Cardiopulmonary status limiting activity       PT Treatment Interventions DME instruction;Gait training;Stair training;Functional mobility training;Therapeutic activities;Therapeutic exercise;Balance training;Patient/family education    PT Goals (Current goals can be found in the Care Plan section)  Acute Rehab PT Goals Patient Stated Goal: to go home PT Goal Formulation: With patient Time For Goal Achievement: 09/23/23 Potential to Achieve Goals: Good    Frequency Min 1X/week     Co-evaluation               AM-PAC PT "6 Clicks" Mobility  Outcome Measure Help needed turning from your back to your side while in a flat bed without using bedrails?: None Help needed moving from lying on your back to sitting on the side of a flat bed without using bedrails?: A Little Help needed moving to and from a bed to a chair (including a wheelchair)?: A Little Help needed standing up from a chair using your arms (e.g., wheelchair or bedside chair)?: A Little Help needed to walk in hospital room?: A Little Help needed climbing 3-5 steps with a railing? : A Little 6 Click Score: 19    End of Session Equipment Utilized During Treatment: Oxygen Activity Tolerance: Patient tolerated treatment well Patient left: in bed;with call bell/phone within reach Nurse  Communication: Mobility status PT Visit Diagnosis: Muscle weakness (generalized) (M62.81);Unsteadiness on feet (R26.81)    Time: 7829-5621 PT Time Calculation (min) (ACUTE ONLY): 20 min   Charges:   PT Evaluation $PT Eval Moderate Complexity: 1 Mod   PT General Charges $$ ACUTE PT VISIT: 1 Visit        Donna Bernard, PT, MPT   Ina Homes 09/16/2023, 10:17 AM

## 2023-09-16 NOTE — Progress Notes (Signed)
   09/16/23 1727 09/16/23 1728  Vitals  Temp 98.4 F (36.9 C)  --   Temp Source Oral  --   BP (!) 143/92 (!) 143/92  MAP (mmHg) 107 107  BP Location Right Arm  --   BP Method Automatic  --   Patient Position (if appropriate) Lying  --   Pulse Rate 77 81  Pulse Rate Source Monitor  --   ECG Heart Rate 80 82  Resp 18  --   MEWS COLOR  MEWS Score Color Green Green  Oxygen Therapy  SpO2 92 % 91 %  MEWS Score  MEWS Temp 0 0  MEWS Systolic 0 0  MEWS Pulse 0 0  MEWS RR 0 0  MEWS LOC 0 0  MEWS Score 0 0  Provider Notification  Provider Name/Title  --  Dr. Jomarie Longs Attending  Date Provider Notified  --  09/16/23  Time Provider Notified  --  1727  Method of Notification  --  Page  Notification Reason  --  Other (Comment) (Non-sustained V-Tach)  Provider response  --  Evaluate remotely;See new orders  Date of Provider Response  --  09/16/23  Time of Provider Response  --  1729   Patient with another run of non-sustained V-tach. Page sent to Dr. Jomarie Longs.  New orders received.

## 2023-09-16 NOTE — Progress Notes (Signed)
  Tele called about a 7 beat run of VTACH. Went to assess patient and vitals obtained.  Stated that around the time, he was working with the mobility team to ambulate.     09/16/23 1557  Vitals  BP (!) 133/94  MAP (mmHg) 106  BP Location Right Arm  BP Method Automatic  Patient Position (if appropriate) Lying  Pulse Rate 80  Pulse Rate Source Monitor  ECG Heart Rate 80  Resp 18  MEWS COLOR  MEWS Score Color Green  Oxygen Therapy  SpO2 91 %  O2 Device Room Air  MEWS Score  MEWS Temp 0  MEWS Systolic 0  MEWS Pulse 0  MEWS RR 0  MEWS LOC 0  MEWS Score 0

## 2023-09-16 NOTE — Progress Notes (Signed)
Mobility Specialist Progress Note:   09/16/23 1611  Mobility  Activity Ambulated with assistance in hallway  Level of Assistance Contact guard assist, steadying assist  Assistive Device None  Distance Ambulated (ft) 145 ft  Activity Response Tolerated well  Mobility Referral Yes  $Mobility charge 1 Mobility  Mobility Specialist Start Time (ACUTE ONLY) 1536  Mobility Specialist Stop Time (ACUTE ONLY) 1548  Mobility Specialist Time Calculation (min) (ACUTE ONLY) 12 min   Pt received in bed, agreeable to mobility. Pt able to ambulate in hallway with CG. Denied any discomfort or SOB during ambulation, asymptomatic throughout. Pt returned to bed with call bell in reach and all needs met.   Leory Plowman  Mobility Specialist Please contact via [employer] office at [phone]

## 2023-09-17 ENCOUNTER — Other Ambulatory Visit (HOSPITAL_COMMUNITY): Payer: Self-pay

## 2023-09-17 DIAGNOSIS — I5033 Acute on chronic diastolic (congestive) heart failure: Secondary | ICD-10-CM | POA: Diagnosis not present

## 2023-09-17 LAB — GLUCOSE, CAPILLARY
Glucose-Capillary: 133 mg/dL — ABNORMAL HIGH (ref 70–99)
Glucose-Capillary: 168 mg/dL — ABNORMAL HIGH (ref 70–99)
Glucose-Capillary: 133 mg/dL — ABNORMAL HIGH (ref 70–99)
Glucose-Capillary: 147 mg/dL — ABNORMAL HIGH (ref 70–99)

## 2023-09-17 LAB — BASIC METABOLIC PANEL
Anion gap: 9 (ref 5–15)
BUN: 26 mg/dL — ABNORMAL HIGH (ref 8–23)
CO2: 29 mmol/L (ref 22–32)
Calcium: 8.6 mg/dL — ABNORMAL LOW (ref 8.9–10.3)
Chloride: 97 mmol/L — ABNORMAL LOW (ref 98–111)
Creatinine, Ser: 1.67 mg/dL — ABNORMAL HIGH (ref 0.61–1.24)
GFR, Estimated: 40 mL/min — ABNORMAL LOW (ref >60)
Glucose, Bld: 139 mg/dL — ABNORMAL HIGH (ref 70–99)
Potassium: 4.1 mmol/L (ref 3.5–5.1)
Sodium: 135 mmol/L (ref 135–145)

## 2023-09-17 NOTE — Progress Notes (Signed)
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 In the ED, he was afebrile BP elevated 186/126 and was hypoxic to 89% and put on 2 L via nasal cannula.BNP of 496.  Troponin of 19. Chest x-ray with bilateral costophrenic angle blunting likely edema. -given IV 20 mg Lasix and a total of 20 mg hydralazine   Subjective: -Feels better, breathing is improving  Assessment and Plan:  Acute on chronic combined systolic and diastolic CHF -presented with exertion dyspnea, bilateral pitting LE edema, CXR with bibasilar edema  -Last echocardiogram in 2022 with EF of 45 to 50%, mild VH, indeterminate diastolic parameters -Repeat echo with EF 40-45%, normal RV -Volume status improved, 6.9 L negative, weight down 13 LB -Creatinine trended up higher, hold further Lasix today -Continue Jardiance and Aldactone -Wean off O2, discharge planning, BMP in a.m.  Acute hypoxic respiratory failure (HCC) -required 2L via [place] on presentation -Wean off O2  VTE (venous thromboembolism) Hx of cytogenic stroke/splenic embolism -continue Xarelto  COPD (chronic obstructive pulmonary disease) (HCC) -stable. Not in acute exacerbation  CAD (coronary artery disease) -Continue aspirin  Diabetes mellitus (HCC) -A1C 7.3 in March -SSI  HYPERTENSION, BENIGN -New hydralazine and Imdur improving,  HYPERCHOLESTEROLEMIA -continue statin and fenofibrate   DVT prophylaxis: xarelto Code Status: Full Family Communication: None present Disposition Plan: Home tomorrow if stable  Consultants:    Procedures:   Antimicrobials:    Objective: Vitals:   09/16/23 2327 09/17/23 0604 09/17/23 0727 09/17/23 1053  BP: 138/79 (!)  153/98 (!) 148/89 137/85  Pulse: 84 71 77 77  Resp: 18 17 17 18   Temp: 98.2 F (36.8 C) 98.2 F (36.8 C) 98.2 F (36.8 C) 98.1 F (36.7 C)  TempSrc: Oral Oral Oral Oral  SpO2: 94% 95% 92% 91%  Weight:  69 kg    Height:        Intake/Output Summary (Last 24 hours) at 09/17/2023 1212 Last data filed at 09/17/2023 1053 Gross per 24 hour  Intake 778.28 ml  Output 2575 ml  Net -1796.72 ml   Filed Weights   09/15/23 0441 09/16/23 0409 09/17/23 0604  Weight: 74.5 kg 71.6 kg 69 kg    Examination:  General exam: Chronically ill elderly male sitting up in bed, AAOx3 HEENT: No JVD CVS: S1-S2, regular rhythm Lungs: Clear bilaterally Abdomen: Soft, nontender, bowel sounds present Extremities: No edema Skin: No rashes Psychiatry:  Mood & affect appropriate.     Data Reviewed:   CBC: Recent Labs  Lab 09/14/23 1148  WBC 7.2  NEUTROABS 5.2  HGB 13.4  HCT 39.6  MCV 90.8  PLT 212   Basic Metabolic Panel: Recent Labs  Lab 09/14/23 1148 09/15/23 0800 09/16/23 0529 09/17/23 0252  NA 138 139 136 135  K 4.1 4.3 3.6 4.1  CL 104 107 100 97*  CO2 26 22 26 29   GLUCOSE 180* 128* 118* 139*  BUN 13 11 16  26*  CREATININE 0.88 1.15 1.26* 1.67*  CALCIUM 8.2* 8.3* 8.5* 8.6*  MG  --  1.8 1.7  --    GFR: Estimated Creatinine Clearance: 32.7 mL/min (A) (by C-G formula based on SCr of 1.67 mg/dL (H)). Liver Function Tests: Recent Labs  Lab 09/14/23 1148  AST 26  ALT 19  ALKPHOS 48  BILITOT 0.8  PROT 5.7*  ALBUMIN 3.0*   No results for input(s): "LIPASE", "AMYLASE" in the last 168 hours. No results for input(s): "AMMONIA" in the last 168 hours. Coagulation Profile: No results for input(s): "INR", "PROTIME" in the last 168 hours. Cardiac Enzymes: No results for input(s): "CKTOTAL", "CKMB", "CKMBINDEX", "TROPONINI" in the last 168 hours. BNP (last 3 results) No results for input(s): "PROBNP" in the last 8760 hours. HbA1C: No results for input(s): "HGBA1C" in the last 72  hours. CBG: Recent Labs  Lab 09/16/23 1115 09/16/23 1634 09/16/23 2048 09/17/23 0612 09/17/23 1049  GLUCAP 181* 103* 206* 133* 168*   Lipid Profile: No results for input(s): "CHOL", "HDL", "LDLCALC", "TRIG", "CHOLHDL", "LDLDIRECT" in the last 72 hours. Thyroid Function Tests: No results for input(s): "TSH", "T4TOTAL", "FREET4", "T3FREE", "THYROIDAB" in the last 72 hours. Anemia Panel: No results for input(s): "VITAMINB12", "FOLATE", "FERRITIN", "TIBC", "IRON", "RETICCTPCT" in the last 72 hours. Urine analysis:    Component Value Date/Time   COLORURINE YELLOW 04/04/2020 1748   APPEARANCEUR CLEAR 04/04/2020 1748   LABSPEC >1.030 (H) 04/04/2020 1748   PHURINE 6.0 04/04/2020 1748   GLUCOSEU 100 (A) 04/04/2020 1748   HGBUR TRACE (A) 04/04/2020 1748   HGBUR trace-lysed 08/17/2009 1039   BILIRUBINUR NEGATIVE 04/04/2020 1748   BILIRUBINUR negative 07/14/2018 1001   KETONESUR NEGATIVE 04/04/2020 1748   PROTEINUR >300 (A) 04/04/2020 1748   UROBILINOGEN 0.2 07/14/2018 1001   UROBILINOGEN 0.2 08/17/2009 1039   NITRITE NEGATIVE 04/04/2020 1748   LEUKOCYTESUR NEGATIVE 04/04/2020 1748   Sepsis Labs: @LABRCNTIP (procalcitonin:4,lacticidven:4)  ) Recent Results (from the past 240 hour(s))  SARS Coronavirus 2 by RT PCR (hospital order, performed in [facility] hospital lab) *cepheid single result test* Anterior Nasal Swab     Status: None   Collection Time: 09/14/23 11:48 AM   Specimen: Anterior Nasal Swab  Result Value Ref Range Status   SARS Coronavirus 2 by RT PCR NEGATIVE NEGATIVE Final    Comment: (NOTE) SARS-CoV-2 target nucleic acids are NOT DETECTED.  The SARS-CoV-2 RNA is generally detectable in upper and lower respiratory specimens during the acute phase of infection. The lowest concentration of SARS-CoV-2 viral copies this assay can detect is 250 copies / mL. A negative result does not preclude SARS-CoV-2 infection and should not be used as the sole basis for treatment or  other patient management decisions.  A negative result may occur with improper specimen collection / handling, submission of specimen other than nasopharyngeal swab, presence of viral mutation(s) within the areas targeted by this assay, and inadequate number of viral copies (<250 copies / mL). A negative result must be combined with clinical observations, patient history, and epidemiological information.  Fact Sheet for Patients:   RoadLapTop.co.za  Fact Sheet for Healthcare Providers: http://kim-miller.com/  This test is not yet approved or  cleared by the [place] FDA and has been authorized for detection and/or diagnosis of SARS-CoV-2 by FDA under an Emergency Use Authorization (EUA).  This EUA will remain in effect (meaning this test can be used) for the duration of the COVID-19 declaration under Section 564(b)(1) of the Act, 21 U.S.C. section 360bbb-3(b)(1), unless the authorization is terminated or revoked sooner.  Performed at [facility], [address], [place], [place] [address]      Radiology Studies: ECHOCARDIOGRAM COMPLETE  Result Date: 09/16/2023    ECHOCARDIOGRAM REPORT   Patient Name:   Nathan Fernandez Date of Exam: 09/16/2023 Medical Rec #:  [id number]     Height:       69.0 in Accession #:    [id number]    Weight:       157.8 lb Date of Birth:  1941-04-07     BSA:          1.868 m Patient Age:    82 years      BP:           123/84 mmHg Patient Gender: M             HR:           79 bpm. Exam Location:  Inpatient Procedure: 2D Echo, Color Doppler and Cardiac Doppler Indications:    acute systolic chf  History:        Patient has prior history of Echocardiogram examinations, most                 recent 02/12/2021. CHF, CAD, COPD; Risk Factors:Diabetes,                 Hypertension and Dyslipidemia.  Sonographer:    Delcie Roch RDCS Referring Phys: [id number] CHING T TU IMPRESSIONS  1. Left ventricular ejection  fraction, by estimation, is 40 to 45%. The left ventricle has mildly decreased function. The left ventricle demonstrates global hypokinesis. Left ventricular diastolic parameters are indeterminate.  2. Right ventricular systolic function is normal. The right ventricular size is normal. There is normal pulmonary artery systolic pressure. The estimated right ventricular systolic pressure is 33.0 mmHg.  3. The mitral valve is normal in structure. Trivial mitral valve regurgitation. No evidence of mitral stenosis.  4. The aortic valve is normal in structure. Aortic valve regurgitation is trivial. No aortic stenosis is present.  5. The aortic root measurement of 3.8cm is normal when indexed for BSA.  6. The inferior vena cava is normal in size with greater than 50% respiratory variability, suggesting right atrial pressure of 3 mmHg. FINDINGS  Left Ventricle: Left ventricular ejection fraction, by estimation, is 40 to 45%. The left ventricle has mildly decreased function. The left ventricle demonstrates global hypokinesis. The left ventricular internal cavity size was normal in size. There is  no left ventricular hypertrophy. Left ventricular diastolic parameters are indeterminate. Normal left ventricular filling pressure. Right Ventricle: The right ventricular size is normal. No increase in right ventricular wall thickness. Right ventricular systolic function is normal. There is normal pulmonary artery systolic pressure. The tricuspid regurgitant velocity is 2.74 m/s, and  with an assumed right atrial pressure of 3 mmHg, the estimated right ventricular systolic pressure is 33.0 mmHg. Left Atrium: Left atrial size was normal in size. Right Atrium: Right atrial size was normal in size. Pericardium: There is no evidence of pericardial effusion. Mitral Valve: The mitral valve is normal in structure. Trivial mitral valve regurgitation. No evidence of mitral valve stenosis. Tricuspid Valve: The tricuspid valve is normal in  structure. Tricuspid valve regurgitation is trivial. No evidence of tricuspid stenosis. Aortic Valve: The aortic valve is normal in structure. Aortic valve regurgitation is trivial. No aortic stenosis is present. Pulmonic Valve: The pulmonic valve was normal in structure. Pulmonic valve regurgitation is not visualized. No evidence of pulmonic stenosis. Aorta: The aortic root measurement of 3.8cm is normal when indexed for BSA. The aortic root is normal in size and structure. Venous: The inferior vena cava is normal in size with greater than 50% respiratory variability, suggesting right atrial pressure of 3 mmHg. IAS/Shunts: No atrial level shunt detected by  color flow Doppler.  LEFT VENTRICLE PLAX 2D LVIDd:         5.30 cm     Diastology LVIDs:         4.60 cm     LV e' medial:    5.55 cm/s LV PW:         1.00 cm     LV E/e' medial:  10.7 LV IVS:        0.90 cm     LV e' lateral:   6.96 cm/s LVOT diam:     2.00 cm     LV E/e' lateral: 8.5 LV SV:         45 LV SV Index:   24 LVOT Area:     3.14 cm  LV Volumes (MOD) LV vol d, MOD A4C: 83.9 ml LV vol s, MOD A4C: 53.8 ml LV SV MOD A4C:     83.9 ml RIGHT VENTRICLE            IVC RV Basal diam:  3.10 cm    IVC diam: 1.80 cm RV S prime:     9.03 cm/s TAPSE (M-mode): 2.4 cm LEFT ATRIUM             Index        RIGHT ATRIUM           Index LA diam:        4.00 cm 2.14 cm/m   RA Area:     14.00 cm LA Vol (A2C):   59.3 ml 31.74 ml/m  RA Volume:   33.90 ml  18.14 ml/m LA Vol (A4C):   38.6 ml 20.66 ml/m LA Biplane Vol: 50.5 ml 27.03 ml/m  AORTIC VALVE LVOT Vmax:   85.20 cm/s LVOT Vmean:  54.100 cm/s LVOT VTI:    0.142 m  AORTA Ao Root diam: 3.80 cm Ao Asc diam:  3.40 cm MV E velocity: 59.50 cm/s  TRICUSPID VALVE MV A velocity: 74.40 cm/s  TR Peak grad:   30.0 mmHg MV E/A ratio:  0.80        TR Vmax:        274.00 cm/s                             SHUNTS                            Systemic VTI:  0.14 m                            Systemic Diam: 2.00 cm Armanda Magic MD  Electronically signed by Armanda Magic MD Signature Date/Time: 09/16/2023/4:51:06 PM    Final      Scheduled Meds:  aspirin EC  81 mg Oral Daily   buPROPion  150 mg Oral QHS   carvedilol  6.25 mg Oral BID WC   dorzolamide-timolol  1 drop Both Eyes BID   empagliflozin  10 mg Oral Daily   fenofibrate  54 mg Oral Daily   hydrALAZINE  10 mg Oral Q12H   insulin aspart  0-9 Units Subcutaneous TID WC   isosorbide mononitrate  15 mg Oral Daily   rivaroxaban  20 mg Oral Q supper   rosuvastatin  20 mg Oral Daily   spironolactone  12.5 mg Oral Daily   traZODone  200 mg Oral QHS   Continuous  Infusions:  sodium chloride Stopped (09/17/23 0119)     LOS: 3 days    Time spent:    Zannie Cove, MD [facility]   09/17/2023, 12:12 PM

## 2023-09-17 NOTE — Progress Notes (Signed)
   Heart Failure Stewardship Pharmacist Progress Note   PCP: Joaquim Nam, MD PCP-Cardiologist: Charlton Haws, MD    HPI:  82 yo M with PMH of CVA, CHF, HTN, CAD, COPD, T2DM, HLD, and BPH.   Presented to the ED on 9/23 with shortness of breath and LE edema. In the ED, was afebrile and hypertensive 186/126. BNP elevated 496. CXR with mild edema. ECHO 9/25 showed LVEF 40-45% (was 45-50% in 01/2021), global hypokinesis, no LVH, RV normal, normal PA pressure.   Current HF Medications: Beta Blocker: carvedilol 6.25 mg BID MRA: spironolactone 12.5 mg daily SGLT2i: Jardiance 10 mg daily Other: hydralazine 10 mg BID + Imdur 15 mg daily  Prior to admission HF Medications: Beta blocker: carvedilol 6.25 mg BID Other: hydralazine 10 mg BID + Imdur 15 mg daily  Pertinent Lab Values: Serum creatinine 1.67, BUN 26, Potassium 4.1, Sodium 135, BNP 496.4, Magnesium 1.7  Vital Signs: Weight: 152 lbs (admission weight: 164 lbs) Blood pressure: 140/80s  Heart rate: 70s  I/O: net -2.7L yesterday; net -6.9L since admission  Medication Assistance / Insurance Benefits Check: Does the patient have prescription insurance?  Yes Type of insurance plan: UHC Medicare  Does the patient qualify for medication assistance through manufacturers or grants?   Yes Eligible grants and/or patient assistance programs: Jardiance Medication assistance applications in progress: Jardiance  Medication assistance applications approved: none Approved medication assistance renewals will be completed by: [facility]  Outpatient Pharmacy:  Prior to admission [facility] Is the patient willing to use [facility] TOC pharmacy at discharge? Yes Is the patient willing to transition their outpatient pharmacy to utilize a [facility] outpatient pharmacy?   No    Assessment: 1. 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 NYHA class II symptoms. - Off IV lasix. Strict I/Os and daily weights. Keep K>4  and Mg>2. - Continue carvedilol 6.25 mg BID - Allergy to ACE/ARB with lip swelling  - On spironolactone 12.5 mg daily, consider increasing to 25 mg daily tomorrow if creatinine improved (SCr 1.26>1.67) - Continue Jardiance 10 mg daily   Plan: 1) Medication changes recommended at this time: - Increase spironolactone to 25 mg daily tomorrow if creatinine improved  2) Patient assistance: - In donut hole/coverage gap - Initial Jardiance copay $178 - started patient assistance application  3)  Education  - Patient has been educated on current HF medications and potential additions to HF medication regimen - Patient verbalizes understanding that over the next few months, these medication doses may change and more medications may be added to optimize HF regimen - Patient has been educated on basic disease state pathophysiology and goals of therapy   Sharen Hones, PharmD, BCPS Heart Failure Stewardship Pharmacist Phone [phone]

## 2023-09-17 NOTE — TOC Benefit Eligibility Note (Signed)
Pharmacy Patient Advocate Encounter  Insurance verification completed.    The patient is insured through  [place] [employer]    Ran test claim for [place]. Currently a quantity of 30 is a 30 day supply and the co-pay is $178.00. Patient is in coverage gap/donut hole .   This test claim was processed through [facility]- copay amounts may vary at other pharmacies due to pharmacy/plan contracts, or as the patient moves through the different stages of their insurance plan.

## 2023-09-17 NOTE — Progress Notes (Signed)
Mobility Specialist Progress Note:   09/17/23 1431  Mobility  Activity Ambulated with assistance in hallway  Level of Assistance Contact guard assist, steadying assist  Assistive Device Front wheel walker  Distance Ambulated (ft) 200 ft  Activity Response Tolerated well  Mobility Referral Yes  $Mobility charge 1 Mobility  Mobility Specialist Start Time (ACUTE ONLY) 1415  Mobility Specialist Stop Time (ACUTE ONLY) 1430  Mobility Specialist Time Calculation (min) (ACUTE ONLY) 15 min   Pre Mobility: 76 HR , 91% SpO2 RA During Mobility: 80-91% SpO2 2 L  Post Mobility: 72 HR , 92% SpO2 RA  Pt received in bed, extremely irritable but agreeable to mobility. Pt desat to 80% on RA during ambulation. Recovered with pursed lip breathing to 88% but requiring 2 L to get SpO2 levels above 90%. Pt denied any SOB or discomfort during session. Pt returned to bed with call bell in reach and all needs met.   Leory Plowman  Mobility Specialist Please contact via [employer] office at [phone]

## 2023-09-17 NOTE — Discharge Instructions (Signed)
Heart Healthy, Consistent Carbohydrate Nutrition Therapy   A heart-healthy and consistent carbohydrate diet is recommended to manage heart disease and diabetes. To follow a heart-healthy and consistent carbohydrate diet, Eat a balanced diet with whole grains, fruits and vegetables, and lean protein sources.  Choose heart-healthy unsaturated fats. Limit saturated fats, trans fats, and cholesterol intake. Eat more plant-based or vegetarian meals using beans and soy foods for protein.  Eat whole, unprocessed foods to limit the amount of sodium (salt) you eat.  Choose a consistent amount of carbohydrate at each meal and snack. Limit refined carbohydrates especially sugar, sweets and sugar-sweetened beverages.  If you drink alcohol, do so in moderation: one serving per day (women) and two servings per day (men). o One serving is equivalent to 12 ounces beer, 5 ounces wine, or 1.5 ounces distilled spirits  Tips Tips for Choosing Heart-Healthy Fats Choose lean protein and low-fat dairy foods to reduce saturated fat intake. Saturated fat is usually found in animal-based protein and is associated with certain health risks. Saturated fat is the biggest contributor to raise low-density lipoprotein (LDL) cholesterol levels. Research shows that limiting saturated fat lowers unhealthy cholesterol levels. Eat no more than 7% of your total calories each day from saturated fat. Ask your RDN to help you determine how much saturated fat is right for you. There are many foods that do not contain large amounts of saturated fats. Swapping these foods to replace foods high in saturated fats will help you limit the saturated fat you eat and improve your cholesterol levels. You can also try eating more plant-based or vegetarian meals. Instead of. Try:  Whole milk, cheese, yogurt, and ice cream 1% or skim milk, low-fat cheese, non-fat yogurt, and low-fat ice cream  Fatty, marbled beef and pork Lean beef, pork, or venison   Poultry with skin Poultry without skin  Butter, stick margarine Reduced-fat, whipped, or liquid spreads  Coconut oil, palm oil Liquid vegetable oils: corn, canola, olive, soybean and safflower oils   Avoid foods that contain trans fats. Trans fats increase levels of LDL-cholesterol. Hydrogenated fat in processed foods is the main source of trans fats in foods.  Trans fats can be found in stick margarine, shortening, processed sweets, baked goods, some fried foods, and packaged foods made with hydrogenated oils. Avoid foods with "partially hydrogenated oil" on the ingredient list such as: cookies, pastries, baked goods, biscuits, crackers, microwave popcorn, and frozen dinners. Choose foods with heart healthy fats. Polyunsaturated and monounsaturated fat are unsaturated fats that may help lower your blood cholesterol level when used in place of saturated fat in your diet. Ask your RDN about taking a dietary supplement with plant sterols and stanols to help lower your cholesterol level. Research shows that substituting saturated fats with unsaturated fats is beneficial to cholesterol levels. Try these easy swaps: Instead of. Try:  Butter, stick margarine, or solid shortening Reduced-fat, whipped, or liquid spreads  Beef, pork, or poultry with skin Fish and seafood  Chips, crackers, snack foods Raw or unsalted nuts and seeds or nut butters Hummus with vegetables Avocado on toast  Coconut oil, palm oil Liquid vegetable oils: corn, canola, olive, soybean and safflower oils  Limit the amount of cholesterol you eat to less than 200 milligrams per day. Cholesterol is a substance carried through the bloodstream via lipoproteins, which are known as "transporters" of fat. Some body functions need cholesterol to work properly, but too much cholesterol in the bloodstream can damage arteries and build up blood vessel linings (  which can lead to heart attack and stroke). You should eat less than 200 milligrams  cholesterol per day. People respond differently to eating cholesterol. There is no test available right now that can figure out which people will respond more to dietary cholesterol and which will respond less. For individuals with high intake of dietary cholesterol, different types of increase (none, small, moderate, large) in LDL-cholesterol levels are all possible.  Food sources of cholesterol include egg yolks and organ meats such as liver, gizzards. Limit egg yolks to two to four per week and avoid organ meats like liver and gizzards to control cholesterol intake. Tips for Choosing Heart-Healthy Carbohydrates Consume a consistent amount of carbohydrate It is important to eat foods with carbohydrates in moderation because they impact your blood glucose level. Carbohydrates can be found in many foods such as: Grains (breads, crackers, rice, pasta, and cereals)  Starchy Vegetables (potatoes, corn, and peas)  Beans and legumes  Milk, soy milk, and yogurt  Fruit and fruit juice  Sweets (cakes, cookies, ice cream, jam and jelly) Your RDN will help you set a goal for how many carbohydrate servings to eat at your meals and snacks. For many adults, eating 3 to 5 servings of carbohydrate foods at each meal and 1 or 2 carbohydrate servings for each snack works well.  Check your blood glucose level regularly. It can tell you if you need to adjust when you eat carbohydrates. Choose foods rich in viscous (soluble) fiber Viscous, or soluble, is found in the walls of plant cells. Viscous fiber is found only in plant-based foods. Eating foods with fiber helps to lower your unhealthy cholesterol and keep your blood glucose in range  Rich sources of viscous fiber include vegetables (asparagus, Brussels sprouts, sweet potatoes, turnips) fruit (apricots, mangoes, oranges), legumes, and whole grains (barley, oats, and oat bran).  As you increase your fiber intake gradually, also increase the amount of water you  drink. This will help prevent constipation.  If you have difficulty achieving this goal, ask your RDN about fiber laxatives. Choose fiber supplements made with viscous fibers such as psyllium seed husks or methylcellulose to help lower unhealthy cholesterol.  Limit refined carbohydrates  There are three types of carbohydrates: starches, sugar, and fiber. Some carbohydrates occur naturally in food, like the starches in rice or corn or the sugars in fruits and milk. Refined carbohydrates--foods with high amounts of simple sugars--can raise triglyceride levels. High triglyceride levels are associated with coronary heart disease. Some examples of refined carbohydrate foods are table sugar, sweets, and beverages sweetened with added sugar. Tips for Reducing Sodium (Salt) Although sodium is important for your body to function, too much sodium can be harmful for people with high blood pressure. As sodium and fluid buildup in your tissues and bloodstream, your blood pressure increases. High blood pressure may cause damage to other organs and increase your risk for a stroke. Even if you take a pill for blood pressure or a water pill (diuretic) to remove fluid, it is still important to have less salt in your diet. Ask your doctor and RDN what amount of sodium is right for you. Avoid processed foods. Eat more fresh foods.  Fresh fruits and vegetables are naturally low in sodium, as well as frozen vegetables and fruits that have no added juices or sauces.  Fresh meats are lower in sodium than processed meats, such as bacon, sausage, and hotdogs. Read the nutrition label or ask your butcher to help you find a  fresh meat that is low in sodium. Eat less salt--at the table and when cooking.  A single teaspoon of table salt has 2,300 mg of sodium.  Leave the salt out of recipes for pasta, casseroles, and soups.  Ask your RDN how to cook your favorite recipes without sodium Be a smart shopper.  Look for food packages  that say "salt-free" or "sodium-free." These items contain less than 5 milligrams of sodium per serving.  "Very low-sodium" products contain less than 35 milligrams of sodium per serving.  "Low-sodium" products contain less than 140 milligrams of sodium per serving.  Beware for "Unsalted" or "No Added Salt" products. These items may still be high in sodium. Check the nutrition label. Add flavors to your food without adding sodium.  Try lemon juice, lime juice, fruit juice or vinegar.  Dry or fresh herbs add flavor. Try basil, bay leaf, dill, rosemary, parsley, sage, dry mustard, nutmeg, thyme, and paprika.  Pepper, red pepper flakes, and cayenne pepper can add spice t your meals without adding sodium. Hot sauce contains sodium, but if you use just a drop or two, it will not add up to much.  Buy a sodium-free seasoning blend or make your own at home. Additional Lifestyle Tips Achieve and maintain a healthy weight. Talk with your RDN or your doctor about what is a healthy weight for you. Set goals to reach and maintain that weight.  To lose weight, reduce your calorie intake along with increasing your physical activity. A weight loss of 10 to 15 pounds could reduce LDL-cholesterol by 5 milligrams per deciliter. Participate in physical activity. Talk with your health care team to find out what types of physical activity are best for you. Set a plan to get about 30 minutes of exercise on most days.  Foods Recommended Food Group Foods Recommended  Grains Whole grain breads and cereals, including whole wheat, barley, rye, buckwheat, corn, teff, quinoa, millet, amaranth, brown or wild rice, sorghum, and oats Pasta, especially whole wheat or other whole grain types  [employer] [place] [employer], quinoa or wild rice Whole grain crackers, bread, rolls, pitas Home-made bread with reduced-sodium baking soda  Protein Foods Lean cuts of beef and pork (loin, leg, round, extra lean hamburger)  Skinless Press photographer  and other wild game Dried beans and peas Nuts and nut butters Meat alternatives made with soy or textured vegetable protein  Egg whites or egg substitute Cold cuts made with lean meat or soy protein  Dairy Nonfat (skim), low-fat, or 1%-fat milk  Nonfat or low-fat yogurt or cottage cheese Fat-free and low-fat cheese  Vegetables Fresh, frozen, or canned vegetables without added fat or salt   Fruits Fresh, frozen, canned, or dried fruit   Oils Unsaturated oils (corn, olive, peanut, soy, sunflower, canola)  Soft or liquid margarines and vegetable oil spreads  Salad dressings Seeds and nuts  Avocado   Foods Not Recommended Food Group Foods Not Recommended  Grains Breads or crackers topped with salt Cereals (hot or cold) with more than 300 mg sodium per serving Biscuits, cornbread, and other "quick" breads prepared with baking soda Bread crumbs or stuffing mix from a store High-fat bakery products, such as doughnuts, biscuits, croissants, danish pastries, pies, cookies Instant cooking foods to which you add hot water and stir--potatoes, noodles, rice, etc. Packaged starchy foods--seasoned noodle or rice dishes, stuffing mix, macaroni and cheese dinner Snacks made with partially hydrogenated oils, including chips, cheese puffs, snack mixes, regular crackers, butter-flavored popcorn  Protein Foods  Higher-fat cuts of meats (ribs, t-bone steak, regular hamburger) Bacon, sausage, or hot dogs Cold cuts, such as salami or bologna, deli meats, cured meats, corned beef Organ meats (liver, brains, gizzards, sweetbreads) Poultry with skin Fried or smoked meat, poultry, and fish Whole eggs and egg yolks (more than 2-4 per week) Salted legumes, nuts, seeds, or nut/seed butters Meat alternatives with high levels of sodium (>300 mg per serving) or saturated fat (>5 g per serving)  Dairy Whole milk,?2% fat milk, buttermilk Whole milk yogurt or ice cream Cream Half-&-half Cream cheese Sour  cream Cheese  Vegetables Canned or frozen vegetables with salt, fresh vegetables prepared with salt, butter, cheese, or cream sauce Fried vegetables Pickled vegetables such as olives, pickles, or sauerkraut  Fruits Fried fruits Fruits served with butter or cream  Oils Butter, stick margarine, shortening Partially hydrogenated oils or trans fats Tropical oils (coconut, palm, palm kernel oils)  Other Candy, sugar sweetened soft drinks and desserts Salt, sea salt, garlic salt, and seasoning mixes containing salt Bouillon cubes Ketchup, barbecue sauce, Worcestershire sauce, soy sauce, teriyaki sauce Miso Salsa Pickles, olives, relish   Heart Healthy Consistent Carbohydrate Vegetarian (Lacto-Ovo) Sample 1-Day Menu  Breakfast 1 cup oatmeal, cooked (2 carbohydrate servings)   cup blueberries (1 carbohydrate serving)  11 almonds, without salt  1 cup 1% milk (1 carbohydrate serving)  1 cup coffee  Morning Snack 1 cup fat-free plain yogurt (1 carbohydrate serving)  Lunch 1 whole wheat bun (1 carbohydrate servings)  1 black bean burger (1 carbohydrate servings)  1 slice cheddar cheese, low sodium  2 slices tomatoes  2 leaves lettuce  1 teaspoon mustard  1 small pear (1 carbohydrate servings)  1 cup green tea, unsweetened  Afternoon Snack 1/3 cup trail mix with nuts, seeds, and raisins, without salt (1 carbohydrate servinga)  Evening Meal  cup meatless chicken  2/3 cup brown rice, cooked (2 carbohydrate servings)  1 cup broccoli, cooked (2/3 carbohydrate serving)   cup carrots, cooked (1/3 carbohydrate serving)  2 teaspoons olive oil  1 teaspoon balsamic vinegar  1 whole wheat dinner roll (1 carbohydrate serving)  1 teaspoon margarine, soft, tub  1 cup 1% milk (1 carbohydrate serving)  Evening Snack 1 extra small banana (1 carbohydrate serving)  1 tablespoon peanut butter   Heart Healthy Consistent Carbohydrate Vegan Sample 1-Day Menu  Breakfast 1 cup oatmeal, cooked (2  carbohydrate servings)   cup blueberries (1 carbohydrate serving)  11 almonds, without salt  1 cup soymilk fortified with calcium, vitamin B12, and vitamin D  1 cup coffee  Morning Snack 6 ounces soy yogurt (1 carbohydrate servings)  Lunch 1 whole wheat bun(1 carbohydrate servings)  1 black bean burger (1 carbohydrate serving)  2 slices tomatoes  2 leaves lettuce  1 teaspoon mustard  1 small pear (1 carbohydrate servings)  1 cup green tea, unsweetened  Afternoon Snack 1/3 cup trail mix with nuts, seeds, and raisins, without salt (1 carbohydrate servings)  Evening Meal  cup meatless chicken  2/3 cup brown rice, cooked (2 carbohydrate servings)  1 cup broccoli, cooked (2/3 carbohydrate serving)   cup carrots, cooked (1/3 carbohydrate serving)  2 teaspoons olive oil  1 teaspoon balsamic vinegar  1 whole wheat dinner roll (1 carbohydrate serving)  1 teaspoon margarine, soft, tub  1 cup soymilk fortified with calcium, vitamin B12, and vitamin D  Evening Snack 1 extra small banana (1 carbohydrate serving)  1 tablespoon peanut butter    Heart Healthy Consistent Carbohydrate Sample  1-Day Menu  Breakfast 1 cup cooked oatmeal (2 carbohydrate servings)  3/4 cup blueberries (1 carbohydrate serving)  1 ounce almonds  1 cup skim milk (1 carbohydrate serving)  1 cup coffee  Morning Snack 1 cup sugar-free nonfat yogurt (1 carbohydrate serving)  Lunch 2 slices whole-wheat bread (2 carbohydrate servings)  2 ounces lean [place] breast  1 ounce low-fat Swiss cheese  1 teaspoon mustard  1 slice tomato  1 lettuce leaf  1 small pear (1 carbohydrate serving)  1 cup skim milk (1 carbohydrate serving)  Afternoon Snack 1 ounce trail mix with unsalted nuts, seeds, and raisins (1 carbohydrate serving)  Evening Meal 3 ounces salmon  2/3 cup cooked brown rice (2 carbohydrate servings)  1 teaspoon soft margarine  1 cup cooked broccoli with 1/2 cup cooked carrots (1 carbohydrate serving  Carrots,  cooked, boiled, drained, without salt  1 cup lettuce  1 teaspoon olive oil with vinegar for dressing  1 small whole grain roll (1 carbohydrate serving)  1 teaspoon soft margarine  1 cup unsweetened tea  Evening Snack 1 extra-small banana (1 carbohydrate serving)  Copyright 2020  [employer]y [employer]. All rights reserved.

## 2023-09-17 NOTE — Progress Notes (Signed)
Nurse requested Mobility Specialist to perform oxygen saturation test with pt which includes removing pt from oxygen both at rest and while ambulating.  Below are the results from that testing.     Patient Saturations on Room Air at Rest = spO2 91%  Patient Saturations on Room Air while Ambulating = sp02 80% .  Rested and performed pursed lip breathing for 1 minute with sp02 at 88%.  Patient Saturations on 2 Liters of oxygen while Ambulating = sp02 91%  Reported results to nurse.

## 2023-09-17 NOTE — Plan of Care (Signed)
  Problem: Education: Goal: Knowledge of General Education information will improve Description: Including pain rating scale, medication(s)/side effects and non-pharmacologic comfort measures Outcome: Progressing   Problem: Activity: Goal: Risk for activity intolerance will decrease Outcome: Progressing   Problem: Skin Integrity: Goal: Risk for impaired skin integrity will decrease Outcome: Progressing   Problem: Fluid Volume: Goal: Ability to maintain a balanced intake and output will improve Outcome: Progressing

## 2023-09-18 ENCOUNTER — Encounter: Payer: Self-pay | Admitting: Internal Medicine

## 2023-09-18 ENCOUNTER — Other Ambulatory Visit (HOSPITAL_COMMUNITY): Payer: Self-pay

## 2023-09-18 ENCOUNTER — Telehealth (HOSPITAL_COMMUNITY): Payer: Self-pay

## 2023-09-18 DIAGNOSIS — I5033 Acute on chronic diastolic (congestive) heart failure: Secondary | ICD-10-CM | POA: Diagnosis not present

## 2023-09-18 DIAGNOSIS — I5023 Acute on chronic systolic (congestive) heart failure: Secondary | ICD-10-CM

## 2023-09-18 LAB — BASIC METABOLIC PANEL
Anion gap: 8 (ref 5–15)
BUN: 27 mg/dL — ABNORMAL HIGH (ref 8–23)
CO2: 26 mmol/L (ref 22–32)
Calcium: 9.1 mg/dL (ref 8.9–10.3)
Chloride: 102 mmol/L (ref 98–111)
Creatinine, Ser: 1.33 mg/dL — ABNORMAL HIGH (ref 0.61–1.24)
GFR, Estimated: 53 mL/min — ABNORMAL LOW (ref >60)
Glucose, Bld: 168 mg/dL — ABNORMAL HIGH (ref 70–99)
Potassium: 4.4 mmol/L (ref 3.5–5.1)
Sodium: 136 mmol/L (ref 135–145)

## 2023-09-18 LAB — GLUCOSE, CAPILLARY: Glucose-Capillary: 126 mg/dL — ABNORMAL HIGH (ref 70–99)

## 2023-09-18 MED ORDER — FUROSEMIDE 20 MG PO TABS
20.0000 mg | ORAL_TABLET | Freq: Every day | ORAL | 0 refills | Status: DC
  Filled 2023-09-18: qty 30, 30d supply, fill #0

## 2023-09-18 MED ORDER — EMPAGLIFLOZIN 10 MG PO TABS
10.0000 mg | ORAL_TABLET | Freq: Every day | ORAL | 0 refills | Status: DC
Start: 2023-09-19 — End: 2023-11-15
  Filled 2023-09-18: qty 30, 30d supply, fill #0

## 2023-09-18 MED ORDER — SPIRONOLACTONE 25 MG PO TABS
12.5000 mg | ORAL_TABLET | Freq: Every day | ORAL | 0 refills | Status: DC
  Filled 2023-09-18: qty 30, 60d supply, fill #0

## 2023-09-18 NOTE — TOC Benefit Eligibility Note (Signed)
Patient Product/process development scientist completed.    The patient is insured through [facility]. Patient has Medicare and is not eligible for a copay card, but may be able to apply for patient assistance, if available.    Ran test claim for Jardiance 10 mg and the current 30 day co-pay is $178.80 due to being in Coverage Gap (donut hole).   This test claim was processed through [facility]- copay amounts may vary at other pharmacies due to pharmacy/plan contracts, or as the patient moves through the different stages of their insurance plan.     Roland Earl, CPHT Pharmacy Technician III Certified Patient Advocate [facility] Patient Advocate Team Direct Number: [phone]  Fax: [phone]

## 2023-09-18 NOTE — Telephone Encounter (Signed)
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   Called patient's wife with Dr. Fabio Bering advisement. Placed order for myoview. Will send instructions to patient's Mychart.

## 2023-09-18 NOTE — Consult Note (Signed)
Value-Based Care Institute  [facility] Inpatient Consult   09/18/2023  [name] Apr 22, 1941 [id number]  [facility] [[facility]]  Accountable Care Organization [ACO] Patient: Nathan Fernandez Medicare  Primary Care Provider: Joaquim Nam, MD with [facility] is provider is listed for the transition of care follow up appointments and calls   [facility] Liaison screened the patient remotely at [facility]. Spoke with the nurse, Elliot Gurney in the room via hospital phone and patient for discharge and not particularly happy about having oxygen.  Patient placed on the phone and explained to habe post hospital follow up while in community.  Patient agreeable to follow up call. Explained to cancel at anytime.  Patient verbalized understanding.    The patient was screened for post hospitalization with noted medium risk score for unplanned readmission risk.   The patient was assessed for potential [facility]) Care Management service needs for post hospital transition for care coordination. Review of patient's electronic medical record reveals patient is for home wth DME..   Plan: [facility] Liaison will continue to follow progress and disposition to asess for post hospital community care coordination/management needs.  Referral request for community care coordination: request assignment for post hospital care coordination for readmission prevention.   Community Care Management/Population Health does not replace or interfere with any arrangements made by the Inpatient Transition of Care team.   For questions contact:   Charlesetta Shanks, RN, BSN, CCM [place]  [facility], [facility] Liaison Direct Dial: [phone] or secure chat Website: [email or web address]

## 2023-09-18 NOTE — Telephone Encounter (Signed)
Heart Failure Patient Advocate Encounter  Medication assistance forms for Jardiance have been started. Patient has signed forms.  Provider information will need to be completed before submitting.  Forms have been attached to patient chart under 'Media' tab. Routing to appropriate office.  Stephaine H, CPhT Rx Patient Advocate Phone: [phone] 

## 2023-09-18 NOTE — Care Management Important Message (Signed)
Important Message  Patient Details  Name: Nathan Fernandez MRN: [id number] Date of Birth: [birth year]   Important Message Given:  Yes - Medicare IM     Dorena Bodo 09/18/2023, 3:15 PM

## 2023-09-18 NOTE — Progress Notes (Signed)
Occupational Therapy Treatment Patient Details Name: Nathan Fernandez MRN: [id number] DOB: 1941-08-13 Today's Date: 09/18/2023   History of present illness Patient is a 82 year old male with acute on chronic diastolic CHF, dyspnea. History of heart failure, cryptogenic stroke, COPD, T2DM, coronary artery disease and hyperlipidemia.   OT comments  OT educated pt in energy conservation strategies and techniques for increased safety and independence with functional tasks with pt verbalizing understanding of all training. Pt currently performing UB ADLs Independent in sitting and LB ADLs and functional transfers with Supervision to Contact guard assist for safety. Pt is making progress toward goals. Pt will benefit from continued acute skilled OT services to address deficits outlined below and increase safety and independence with functional tasks. No post acute OT follow up is indicated at this time.      If plan is discharge home, recommend the following:  A little help with walking and/or transfers;A little help with bathing/dressing/bathroom;Assistance with cooking/housework;Assist for transportation;Help with stairs or ramp for entrance   Equipment Recommendations  BSC/3in1    Recommendations for Other Services      Precautions / Restrictions Precautions Precautions: Fall Restrictions Weight Bearing Restrictions: No       Mobility Bed Mobility Overal bed mobility: Modified Independent                  Transfers Overall transfer level: Needs assistance Equipment used: None Transfers: Sit to/from Stand, Bed to chair/wheelchair/BSC Sit to Stand: Supervision     Step pivot transfers: Supervision, Contact guard assist     General transfer comment: Supervision to Contact guard assist for safety.     Balance Overall balance assessment: Needs assistance Sitting-balance support: No upper extremity supported, Feet supported Sitting balance-Leahy Scale: Good     Standing  balance support: No upper extremity supported, During functional activity Standing balance-Leahy Scale: Fair                             ADL either performed or assessed with clinical judgement   ADL Overall ADL's : Needs assistance/impaired                                       General ADL Comments: Pt completing UB ADLs Independent in sitting and LB ADLs with Supervision to Contact guard assist for safety in sit/stand. OT edcuated pt in energy conservation strategies for increased safety and independence with funcitonal tasks in the home with pt verbalizing understanding. Energy conservation strategies handout also provided to pt.    Extremity/Trunk Assessment Upper Extremity Assessment Upper Extremity Assessment: Overall WFL for tasks assessed;Right hand dominant   Lower Extremity Assessment Lower Extremity Assessment: Defer to PT evaluation        Vision       Perception     Praxis      Cognition Arousal: Alert Behavior During Therapy: [facility] for tasks assessed/performed Overall Cognitive Status: Within Functional Limits for tasks assessed                                          Exercises      Shoulder Instructions       General Comments Pt on 2L O2 at beginning of session. O2 removed for session and placed  back at end of session. VSS on RA throughout session with O2 sat at >/92%. Pt continues to require rest breaks during functional tasks.     Pertinent Vitals/ Pain       Pain Assessment Pain Assessment: No/denies pain Pain Intervention(s): Monitored during session  Home Living                                          Prior Functioning/Environment              Frequency  Min 1X/week        Progress Toward Goals  OT Goals(current goals can now be found in the care plan section)  Progress towards OT goals: Progressing toward goals  Acute Rehab OT Goals Patient Stated Goal: To return  home and not have to be on continuous O2  Plan      Co-evaluation                 AM-PAC OT "6 Clicks" Daily Activity     Outcome Measure   Help from another person eating meals?: None Help from another person taking care of personal grooming?: None (in sitting) Help from another person toileting, which includes using toliet, bedpan, or urinal?: A Little Help from another person bathing (including washing, rinsing, drying)?: A Little Help from another person to put on and taking off regular upper body clothing?: None (in sitting) Help from another person to put on and taking off regular lower body clothing?: A Little 6 Click Score: 21    End of Session Equipment Utilized During Treatment: Oxygen  OT Visit Diagnosis: Other (comment) (Decreased activity tolerance)   Activity Tolerance Patient tolerated treatment well   Patient Left in bed;with call bell/phone within reach   Nurse Communication Mobility status        Time: 1610-9604 OT Time Calculation (min): 12 min  Charges: OT General Charges $OT Visit: 1 Visit OT Treatments $Self Care/Home Management : 8-22 mins  Nathan Fernandez "Orson Eva., OTR/L, MA Acute Rehab [phone]   Lendon Colonel 09/18/2023, 9:41 AM

## 2023-09-18 NOTE — Progress Notes (Signed)
SATURATION QUALIFICATIONS: (This note is used to comply with regulatory documentation for home oxygen)  Patient Saturations on Room Air at Rest = 87%  Patient Saturations on 2 Liters of oxygen while Ambulating = 94%

## 2023-09-18 NOTE — TOC Transition Note (Signed)
Transition of Care [facility]) - CM/SW Discharge Note   Patient Details  Name: Nathan Fernandez MRN: [id number] Date of Birth: August 20, 1941  [facility] of [facility] CM/SW Contact:  Leone Haven, RN Phone Number: 09/18/2023, 10:27 AM   Clinical Narrative:    Patient is for dc today, he will need home oxygen per staff RN Jules Husbands.  He is also getting a bsc.  [employer] will be delivering this to the patient room prior to dc.  NCM had the letter faxed to Eulah Pont and Thurston Hole so he can resume his outpatient therapy.    Final next level of care: Home/Self Care Barriers to Discharge: Equipment Delay   Patient Goals and CMS Choice CMS Medicare.gov Compare Post Acute Care list provided to:: Patient Choice offered to / list presented to : Patient  Discharge Placement                         Discharge Plan and Services Additional resources added to the After Visit Summary for   In-house Referral: NA Discharge Planning Services: CM Consult Post Acute Care Choice: Home Health          DME Arranged: Oxygen, Bedside commode DME Agency: [employer] Date DME Agency Contacted: 09/18/23 Time DME Agency Contacted: 1026 Representative spoke with at DME Agency: Vaughan Basta HH Arranged: NA          Social Determinants of Health (SDOH) Interventions SDOH Screenings   Food Insecurity: No Food Insecurity (09/14/2023)  Housing: Low Risk  (09/16/2023)  Transportation Needs: No Transportation Needs (09/16/2023)  Utilities: Not At Risk (09/14/2023)  Alcohol Screen: Low Risk  (09/16/2023)  Depression (PHQ2-9): Low Risk  (08/03/2023)  Financial Resource Strain: Low Risk  (09/16/2023)  Physical Activity: Inactive (08/07/2022)  Social Connections: Socially Integrated (08/07/2022)  Stress: No Stress Concern Present (08/07/2022)  Tobacco Use: Medium Risk (09/14/2023)     Readmission Risk Interventions     No data to display

## 2023-09-18 NOTE — TOC Progression Note (Signed)
Transition of Care [facility]) - Progression Note    Patient Details  Name: Nathan Fernandez MRN: [id number] Date of Birth: September 30, 1941  [facility] CM/SW Contact  Leone Haven, RN Phone Number: 09/18/2023, 10:10 AM  Clinical Narrative:    NCM was notified this patient will need home oxygen ,  he is also getting a bsc from [employer].  NCM made referral to [facility] [place] with [employer] for the oxygen as well.   Expected Discharge Plan: Home w Home Health Services Barriers to Discharge: Continued Medical Work up  Expected Discharge Plan and Services In-house Referral: NA Discharge Planning Services: CM Consult Post Acute Care Choice: Home Health Living arrangements for the past 2 months: Single Family Home Expected Discharge Date: 09/18/23               DME Arranged: N/A DME Agency: NA       HH Arranged: NA           Social Determinants of Health (SDOH) Interventions SDOH Screenings   Food Insecurity: No Food Insecurity (09/14/2023)  Housing: Low Risk  (09/16/2023)  Transportation Needs: No Transportation Needs (09/16/2023)  Utilities: Not At Risk (09/14/2023)  Alcohol Screen: Low Risk  (09/16/2023)  Depression (PHQ2-9): Low Risk  (08/03/2023)  Financial Resource Strain: Low Risk  (09/16/2023)  Physical Activity: Inactive (08/07/2022)  Social Connections: Socially Integrated (08/07/2022)  Stress: No Stress Concern Present (08/07/2022)  Tobacco Use: Medium Risk (09/14/2023)    Readmission Risk Interventions     No data to display

## 2023-09-18 NOTE — Progress Notes (Signed)
   Heart Failure Stewardship Pharmacist Progress Note   PCP: Joaquim Nam, MD PCP-Cardiologist: Charlton Haws, MD    HPI:  82 yo M with PMH of CVA, CHF, HTN, CAD, COPD, T2DM, HLD, and BPH.   Presented to the ED on 9/23 with shortness of breath and LE edema. In the ED, was afebrile and hypertensive 186/126. BNP elevated 496. CXR with mild edema. ECHO 9/25 showed LVEF 40-45% (was 45-50% in 01/2021), global hypokinesis, no LVH, RV normal, normal PA pressure.   Current HF Medications: Diuretic: furosemide 20 mg daily Beta Blocker: carvedilol 6.25 mg BID MRA: spironolactone 12.5 mg daily SGLT2i: Jardiance 10 mg daily Other: hydralazine 10 mg BID + Imdur 15 mg daily  Prior to admission HF Medications: Beta blocker: carvedilol 6.25 mg BID Other: hydralazine 10 mg BID + Imdur 15 mg daily  Pertinent Lab Values: Serum creatinine 1.33, BUN 27, Potassium 4.4, Sodium 136, BNP 496.4, Magnesium 1.7  Vital Signs: Weight: 161 lbs (admission weight: 164 lbs) Blood pressure: 120-130/80s  Heart rate: 60-70s  I/O: net -7.1L since admission  Medication Assistance / Insurance Benefits Check: Does the patient have prescription insurance?  Yes Type of insurance plan: UHC Medicare  Does the patient qualify for medication assistance through manufacturers or grants?   Yes Eligible grants and/or patient assistance programs: Jardiance Medication assistance applications in progress: Jardiance  Medication assistance applications approved: none Approved medication assistance renewals will be completed by: [facility]  Outpatient Pharmacy:  Prior to admission outpatient [facility] Is the patient willing to use [facility] TOC pharmacy at discharge? Yes Is the patient willing to transition their outpatient pharmacy to utilize a [facility] outpatient pharmacy?   No    Assessment: 1. 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 NYHA class II symptoms. - Off IV lasix. Strict I/Os and daily  weights. Keep K>4 and Mg>2. - Continue carvedilol 6.25 mg BID - Allergy to ACE/ARB with lip swelling  - On spironolactone 12.5 mg daily, consider increasing to 25 mg daily - Continue Jardiance 10 mg daily   Plan: 1) Medication changes recommended at this time: - Increase spironolactone to 25 mg daily  2) Patient assistance: - In donut hole/coverage gap - Initial Jardiance copay $178 - started patient assistance application  3)  Education  - Patient has been educated on current HF medications and potential additions to HF medication regimen - Patient verbalizes understanding that over the next few months, these medication doses may change and more medications may be added to optimize HF regimen - Patient has been educated on basic disease state pathophysiology and goals of therapy   Sharen Hones, PharmD, BCPS Heart Failure Stewardship Pharmacist Phone [phone]

## 2023-09-18 NOTE — Progress Notes (Signed)
  To whomsoever it may concern -Patient is cleared to resume outpatient physical therapy  Zannie Cove, MD

## 2023-09-18 NOTE — Discharge Summary (Signed)
Physician Discharge Summary  Nathan Fernandez ZOX:[id number] DOB: May 14, 1941 DOA: 09/14/2023  PCP: Joaquim Nam, MD  Admit date: 09/14/2023 Discharge date: 09/18/2023  Time spent:45 minutes  Recommendations for Outpatient Follow-up:  PCP in 1 week, please check BMP at follow-up CHF TOC clinic in 2 weeks  Discharge Diagnoses:  Principal Problem:   Acute on chronic diastolic CHF (congestive heart failure) (HCC) Active Problems:   CAD (coronary artery disease)   COPD (chronic obstructive pulmonary disease) (HCC)   Essential hypertension   Type 2 diabetes mellitus with hyperlipidemia (HCC)   VTE (venous thromboembolism)   Discharge Condition: Improved  Diet recommendation: Diabetic, low-sodium, heart healthy  Filed Weights   09/16/23 0409 09/17/23 0604 09/18/23 0410  Weight: 71.6 kg 69 kg 73.4 kg    History of present illness:  82/M w/ 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 In the ED, he was afebrile BP elevated 186/126 and was hypoxic to 89% and put on 2 L via nasal cannula.BNP of 496.  Troponin of 19. Chest x-ray with bilateral costophrenic angle blunting likely edema.  Hospital Course:   Acute on chronic combined systolic and diastolic CHF -presented with exertion dyspnea, bilateral pitting LE edema, CXR with bibasilar edema  -Last echocardiogram in 2022 with EF of 45 to 50%, mild VH, indeterminate diastolic parameters -Repeat echo with EF 40-45%, normal RV -Volume status improved, 6.9 L negative, weight down 13 LB -Improved, transition to Jardiance, low-dose Aldactone and Lasix 20 Mg daily -Discharged home in a stable condition, follow-up in CHF TOC clinic   Acute hypoxic respiratory failure (HCC) -required 3-4L via [place] on presentation -Weaned off O2 at rest, however did desat with  activity, set up with home O2   VTE (venous thromboembolism) Hx of cytogenic stroke/splenic embolism -continue Xarelto   COPD (chronic obstructive pulmonary disease) (HCC) -stable. Not in acute exacerbation   CAD (coronary artery disease) -Continue aspirin   Diabetes mellitus (HCC) -A1C 7.3 in March -SSI   HYPERTENSION, BENIGN -New hydralazine and Imdur improving,   HYPERCHOLESTEROLEMIA -continue statin and fenofibrate  Discharge Exam: Vitals:   09/18/23 0900 09/18/23 0930  BP:    Pulse:    Resp:    Temp:    SpO2: (!) 87% 94%   General exam: Chronically ill elderly male sitting up in bed, AAOx3 HEENT: No JVD CVS: S1-S2, regular rhythm Lungs: Clear bilaterally Abdomen: Soft, nontender, bowel sounds present Extremities: No edema Skin: No rashes Psychiatry:  Mood & affect appropriate.   Discharge Instructions   Discharge Instructions     AMB Referral to Community Care Coordinaton (ACO Patients)   Complete by: As directed    Hospital Follow up referral/Care Coordination:  Readmission prevention  Primary Care Provider: Joaquim Nam, MD [facility] plan: [facility]  Please assign to [facility] RN Care Coordinator for post hospital care coordination for readmission prevention follow up calls and assess for further needs.  Questions please call:   Charlesetta Shanks, RN, BSN, CCM [place]  [facility], [facility] Liaison Direct Dial: [phone] or secure chat Website: [email or web address]     Reason for Referral: Care Coordination (ACO patients)   Disease managment services needed: Nurse Case Manager   Diagnoses of:  Heart Failure Other     Other Diagnosis: new oxygen   Expected date  Physician Discharge Summary  Nathan Fernandez ZOX:[id number] DOB: May 14, 1941 DOA: 09/14/2023  PCP: Joaquim Nam, MD  Admit date: 09/14/2023 Discharge date: 09/18/2023  Time spent:45 minutes  Recommendations for Outpatient Follow-up:  PCP in 1 week, please check BMP at follow-up CHF TOC clinic in 2 weeks  Discharge Diagnoses:  Principal Problem:   Acute on chronic diastolic CHF (congestive heart failure) (HCC) Active Problems:   CAD (coronary artery disease)   COPD (chronic obstructive pulmonary disease) (HCC)   Essential hypertension   Type 2 diabetes mellitus with hyperlipidemia (HCC)   VTE (venous thromboembolism)   Discharge Condition: Improved  Diet recommendation: Diabetic, low-sodium, heart healthy  Filed Weights   09/16/23 0409 09/17/23 0604 09/18/23 0410  Weight: 71.6 kg 69 kg 73.4 kg    History of present illness:  82/M w/ 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 In the ED, he was afebrile BP elevated 186/126 and was hypoxic to 89% and put on 2 L via nasal cannula.BNP of 496.  Troponin of 19. Chest x-ray with bilateral costophrenic angle blunting likely edema.  Hospital Course:   Acute on chronic combined systolic and diastolic CHF -presented with exertion dyspnea, bilateral pitting LE edema, CXR with bibasilar edema  -Last echocardiogram in 2022 with EF of 45 to 50%, mild VH, indeterminate diastolic parameters -Repeat echo with EF 40-45%, normal RV -Volume status improved, 6.9 L negative, weight down 13 LB -Improved, transition to Jardiance, low-dose Aldactone and Lasix 20 Mg daily -Discharged home in a stable condition, follow-up in CHF TOC clinic   Acute hypoxic respiratory failure (HCC) -required 3-4L via [place] on presentation -Weaned off O2 at rest, however did desat with  activity, set up with home O2   VTE (venous thromboembolism) Hx of cytogenic stroke/splenic embolism -continue Xarelto   COPD (chronic obstructive pulmonary disease) (HCC) -stable. Not in acute exacerbation   CAD (coronary artery disease) -Continue aspirin   Diabetes mellitus (HCC) -A1C 7.3 in March -SSI   HYPERTENSION, BENIGN -New hydralazine and Imdur improving,   HYPERCHOLESTEROLEMIA -continue statin and fenofibrate  Discharge Exam: Vitals:   09/18/23 0900 09/18/23 0930  BP:    Pulse:    Resp:    Temp:    SpO2: (!) 87% 94%   General exam: Chronically ill elderly male sitting up in bed, AAOx3 HEENT: No JVD CVS: S1-S2, regular rhythm Lungs: Clear bilaterally Abdomen: Soft, nontender, bowel sounds present Extremities: No edema Skin: No rashes Psychiatry:  Mood & affect appropriate.   Discharge Instructions   Discharge Instructions     AMB Referral to Community Care Coordinaton (ACO Patients)   Complete by: As directed    Hospital Follow up referral/Care Coordination:  Readmission prevention  Primary Care Provider: Joaquim Nam, MD [place] at [facility] plan: [facility]  Please assign to [facility] RN Care Coordinator for post hospital care coordination for readmission prevention follow up calls and assess for further needs.  Questions please call:   Charlesetta Shanks, RN, BSN, CCM [place]  [facility], [facility] Liaison Direct Dial: [phone] or secure chat Website: [email or web address]     Reason for Referral: Care Coordination (ACO patients)   Disease managment services needed: Nurse Case Manager   Diagnoses of:  Heart Failure Other     Other Diagnosis: new oxygen   Expected date  Physician Discharge Summary  Nathan Fernandez ZOX:[id number] DOB: May 14, 1941 DOA: 09/14/2023  PCP: Joaquim Nam, MD  Admit date: 09/14/2023 Discharge date: 09/18/2023  Time spent:45 minutes  Recommendations for Outpatient Follow-up:  PCP in 1 week, please check BMP at follow-up CHF TOC clinic in 2 weeks  Discharge Diagnoses:  Principal Problem:   Acute on chronic diastolic CHF (congestive heart failure) (HCC) Active Problems:   CAD (coronary artery disease)   COPD (chronic obstructive pulmonary disease) (HCC)   Essential hypertension   Type 2 diabetes mellitus with hyperlipidemia (HCC)   VTE (venous thromboembolism)   Discharge Condition: Improved  Diet recommendation: Diabetic, low-sodium, heart healthy  Filed Weights   09/16/23 0409 09/17/23 0604 09/18/23 0410  Weight: 71.6 kg 69 kg 73.4 kg    History of present illness:  82/M w/ 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 In the ED, he was afebrile BP elevated 186/126 and was hypoxic to 89% and put on 2 L via nasal cannula.BNP of 496.  Troponin of 19. Chest x-ray with bilateral costophrenic angle blunting likely edema.  Hospital Course:   Acute on chronic combined systolic and diastolic CHF -presented with exertion dyspnea, bilateral pitting LE edema, CXR with bibasilar edema  -Last echocardiogram in 2022 with EF of 45 to 50%, mild VH, indeterminate diastolic parameters -Repeat echo with EF 40-45%, normal RV -Volume status improved, 6.9 L negative, weight down 13 LB -Improved, transition to Jardiance, low-dose Aldactone and Lasix 20 Mg daily -Discharged home in a stable condition, follow-up in CHF TOC clinic   Acute hypoxic respiratory failure (HCC) -required 3-4L via [place] on presentation -Weaned off O2 at rest, however did desat with  activity, set up with home O2   VTE (venous thromboembolism) Hx of cytogenic stroke/splenic embolism -continue Xarelto   COPD (chronic obstructive pulmonary disease) (HCC) -stable. Not in acute exacerbation   CAD (coronary artery disease) -Continue aspirin   Diabetes mellitus (HCC) -A1C 7.3 in March -SSI   HYPERTENSION, BENIGN -New hydralazine and Imdur improving,   HYPERCHOLESTEROLEMIA -continue statin and fenofibrate  Discharge Exam: Vitals:   09/18/23 0900 09/18/23 0930  BP:    Pulse:    Resp:    Temp:    SpO2: (!) 87% 94%   General exam: Chronically ill elderly male sitting up in bed, AAOx3 HEENT: No JVD CVS: S1-S2, regular rhythm Lungs: Clear bilaterally Abdomen: Soft, nontender, bowel sounds present Extremities: No edema Skin: No rashes Psychiatry:  Mood & affect appropriate.   Discharge Instructions   Discharge Instructions     AMB Referral to Community Care Coordinaton (ACO Patients)   Complete by: As directed    Hospital Follow up referral/Care Coordination:  Readmission prevention  Primary Care Provider: Joaquim Nam, MD [place] at [facility] plan: [facility]  Please assign to [facility] RN Care Coordinator for post hospital care coordination for readmission prevention follow up calls and assess for further needs.  Questions please call:   Charlesetta Shanks, RN, BSN, CCM [place]  [facility], [facility] Liaison Direct Dial: [phone] or secure chat Website: [email or web address]     Reason for Referral: Care Coordination (ACO patients)   Disease managment services needed: Nurse Case Manager   Diagnoses of:  Heart Failure Other     Other Diagnosis: new oxygen   Expected date  Physician Discharge Summary  Nathan Fernandez ZOX:[id number] DOB: May 14, 1941 DOA: 09/14/2023  PCP: Joaquim Nam, MD  Admit date: 09/14/2023 Discharge date: 09/18/2023  Time spent:45 minutes  Recommendations for Outpatient Follow-up:  PCP in 1 week, please check BMP at follow-up CHF TOC clinic in 2 weeks  Discharge Diagnoses:  Principal Problem:   Acute on chronic diastolic CHF (congestive heart failure) (HCC) Active Problems:   CAD (coronary artery disease)   COPD (chronic obstructive pulmonary disease) (HCC)   Essential hypertension   Type 2 diabetes mellitus with hyperlipidemia (HCC)   VTE (venous thromboembolism)   Discharge Condition: Improved  Diet recommendation: Diabetic, low-sodium, heart healthy  Filed Weights   09/16/23 0409 09/17/23 0604 09/18/23 0410  Weight: 71.6 kg 69 kg 73.4 kg    History of present illness:  82/M w/ 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 In the ED, he was afebrile BP elevated 186/126 and was hypoxic to 89% and put on 2 L via nasal cannula.BNP of 496.  Troponin of 19. Chest x-ray with bilateral costophrenic angle blunting likely edema.  Hospital Course:   Acute on chronic combined systolic and diastolic CHF -presented with exertion dyspnea, bilateral pitting LE edema, CXR with bibasilar edema  -Last echocardiogram in 2022 with EF of 45 to 50%, mild VH, indeterminate diastolic parameters -Repeat echo with EF 40-45%, normal RV -Volume status improved, 6.9 L negative, weight down 13 LB -Improved, transition to Jardiance, low-dose Aldactone and Lasix 20 Mg daily -Discharged home in a stable condition, follow-up in CHF TOC clinic   Acute hypoxic respiratory failure (HCC) -required 3-4L via [place] on presentation -Weaned off O2 at rest, however did desat with  activity, set up with home O2   VTE (venous thromboembolism) Hx of cytogenic stroke/splenic embolism -continue Xarelto   COPD (chronic obstructive pulmonary disease) (HCC) -stable. Not in acute exacerbation   CAD (coronary artery disease) -Continue aspirin   Diabetes mellitus (HCC) -A1C 7.3 in March -SSI   HYPERTENSION, BENIGN -New hydralazine and Imdur improving,   HYPERCHOLESTEROLEMIA -continue statin and fenofibrate  Discharge Exam: Vitals:   09/18/23 0900 09/18/23 0930  BP:    Pulse:    Resp:    Temp:    SpO2: (!) 87% 94%   General exam: Chronically ill elderly male sitting up in bed, AAOx3 HEENT: No JVD CVS: S1-S2, regular rhythm Lungs: Clear bilaterally Abdomen: Soft, nontender, bowel sounds present Extremities: No edema Skin: No rashes Psychiatry:  Mood & affect appropriate.   Discharge Instructions   Discharge Instructions     AMB Referral to Community Care Coordinaton (ACO Patients)   Complete by: As directed    Hospital Follow up referral/Care Coordination:  Readmission prevention  Primary Care Provider: Joaquim Nam, MD [place] at [facility] plan: [facility]  Please assign to [facility] RN Care Coordinator for post hospital care coordination for readmission prevention follow up calls and assess for further needs.  Questions please call:   Charlesetta Shanks, RN, BSN, CCM [place]  [facility], [facility] Liaison Direct Dial: [phone] or secure chat Website: [email or web address]     Reason for Referral: Care Coordination (ACO patients)   Disease managment services needed: Nurse Case Manager   Diagnoses of:  Heart Failure Other     Other Diagnosis: new oxygen   Expected date  Physician Discharge Summary  Nathan Fernandez ZOX:[id number] DOB: May 14, 1941 DOA: 09/14/2023  PCP: Joaquim Nam, MD  Admit date: 09/14/2023 Discharge date: 09/18/2023  Time spent:45 minutes  Recommendations for Outpatient Follow-up:  PCP in 1 week, please check BMP at follow-up CHF TOC clinic in 2 weeks  Discharge Diagnoses:  Principal Problem:   Acute on chronic diastolic CHF (congestive heart failure) (HCC) Active Problems:   CAD (coronary artery disease)   COPD (chronic obstructive pulmonary disease) (HCC)   Essential hypertension   Type 2 diabetes mellitus with hyperlipidemia (HCC)   VTE (venous thromboembolism)   Discharge Condition: Improved  Diet recommendation: Diabetic, low-sodium, heart healthy  Filed Weights   09/16/23 0409 09/17/23 0604 09/18/23 0410  Weight: 71.6 kg 69 kg 73.4 kg    History of present illness:  82/M w/ 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 In the ED, he was afebrile BP elevated 186/126 and was hypoxic to 89% and put on 2 L via nasal cannula.BNP of 496.  Troponin of 19. Chest x-ray with bilateral costophrenic angle blunting likely edema.  Hospital Course:   Acute on chronic combined systolic and diastolic CHF -presented with exertion dyspnea, bilateral pitting LE edema, CXR with bibasilar edema  -Last echocardiogram in 2022 with EF of 45 to 50%, mild VH, indeterminate diastolic parameters -Repeat echo with EF 40-45%, normal RV -Volume status improved, 6.9 L negative, weight down 13 LB -Improved, transition to Jardiance, low-dose Aldactone and Lasix 20 Mg daily -Discharged home in a stable condition, follow-up in CHF TOC clinic   Acute hypoxic respiratory failure (HCC) -required 3-4L via [place] on presentation -Weaned off O2 at rest, however did desat with  activity, set up with home O2   VTE (venous thromboembolism) Hx of cytogenic stroke/splenic embolism -continue Xarelto   COPD (chronic obstructive pulmonary disease) (HCC) -stable. Not in acute exacerbation   CAD (coronary artery disease) -Continue aspirin   Diabetes mellitus (HCC) -A1C 7.3 in March -SSI   HYPERTENSION, BENIGN -New hydralazine and Imdur improving,   HYPERCHOLESTEROLEMIA -continue statin and fenofibrate  Discharge Exam: Vitals:   09/18/23 0900 09/18/23 0930  BP:    Pulse:    Resp:    Temp:    SpO2: (!) 87% 94%   General exam: Chronically ill elderly male sitting up in bed, AAOx3 HEENT: No JVD CVS: S1-S2, regular rhythm Lungs: Clear bilaterally Abdomen: Soft, nontender, bowel sounds present Extremities: No edema Skin: No rashes Psychiatry:  Mood & affect appropriate.   Discharge Instructions   Discharge Instructions     AMB Referral to Community Care Coordinaton (ACO Patients)   Complete by: As directed    Hospital Follow up referral/Care Coordination:  Readmission prevention  Primary Care Provider: Joaquim Nam, MD [place] at [facility] plan: [facility]  Please assign to [facility] RN Care Coordinator for post hospital care coordination for readmission prevention follow up calls and assess for further needs.  Questions please call:   Charlesetta Shanks, RN, BSN, CCM [place]  [facility], [facility] Liaison Direct Dial: [phone] or secure chat Website: [email or web address]     Reason for Referral: Care Coordination (ACO patients)   Disease managment services needed: Nurse Case Manager   Diagnoses of:  Heart Failure Other     Other Diagnosis: new oxygen   Expected date  a bedside commode to treat with the following condition  Answer:  Weakness   09/16/23 1445           Allergies  Allergen Reactions   Ace Inhibitors Swelling    Swelling of the tongue   Angiotensin Receptor Blockers Swelling    Tongue swelling   Vasotec [Enalapril] Swelling    Tongue swelling   Pravachol [Pravastatin] Other (See Comments)    Myalgias     Follow-up Information     [facility]. Go in 11 day(s).   Specialty: Cardiology Why: Hospital follow up 10/8 @ 12 noon PLEASE bring a current medication list to appointment FREE valet parking, Entrance C, off [employer] information: [address] [place] [address] [phone]        [facility] Follow up.   Why: BSC will be delivered to room and oxygen Contact information: [phone]        Joaquim Nam, MD Follow up.   Specialty: Family Medicine Contact information: [address] [place] [address] [phone]                   The results of significant diagnostics from this hospitalization (including imaging, microbiology, ancillary and laboratory) are listed below for reference.    Significant Diagnostic Studies: ECHOCARDIOGRAM COMPLETE  Result Date: 09/16/2023    ECHOCARDIOGRAM REPORT   Patient Name:   Nathan Fernandez Date of Exam: 09/16/2023 Medical Rec #:  [id number]     Height:       69.0 in Accession #:    [id number]    Weight:       157.8 lb Date of Birth:  [birth year]     BSA:          1.868 m Patient Age:    82 years      BP:           123/84 mmHg Patient Gender: M             HR:           79 bpm. Exam Location:  Inpatient Procedure: 2D Echo, Color Doppler and Cardiac Doppler Indications:    acute systolic chf  History:        Patient has prior history of Echocardiogram examinations, most                 recent 02/12/2021. CHF, CAD, COPD; Risk Factors:Diabetes,                 Hypertension and Dyslipidemia.  Sonographer:    Delcie Roch RDCS Referring Phys: [id number] CHING T TU IMPRESSIONS  1. Left ventricular ejection fraction, by estimation, is 40 to 45%. The left ventricle has mildly decreased function. The left ventricle demonstrates global hypokinesis. Left ventricular diastolic parameters are indeterminate.  2. Right ventricular systolic function is normal. The right ventricular size is normal. There is normal pulmonary artery systolic pressure. The estimated right ventricular systolic pressure is 33.0 mmHg.  3. The mitral valve is normal in structure. Trivial mitral valve regurgitation. No evidence of mitral stenosis.  4. The aortic valve is normal in structure. Aortic valve regurgitation is trivial. No aortic stenosis is present.  5. The aortic root measurement of 3.8cm is normal when indexed for BSA.  6. The inferior vena cava is normal in size with greater than 50% respiratory variability, suggesting right atrial pressure of 3 mmHg. FINDINGS  Left Ventricle:

## 2023-09-21 ENCOUNTER — Telehealth: Payer: Self-pay | Admitting: *Deleted

## 2023-09-21 ENCOUNTER — Encounter (HOSPITAL_COMMUNITY): Payer: Self-pay | Admitting: Cardiovascular Disease

## 2023-09-21 ENCOUNTER — Telehealth (HOSPITAL_COMMUNITY): Payer: Self-pay | Admitting: *Deleted

## 2023-09-21 NOTE — Progress Notes (Signed)
Care Coordination   Note   09/21/2023 Name: Nathan Fernandez MRN: [id number] DOB: 05/13/1941  Nathan Fernandez is a 82 y.o. year old male who sees Joaquim Nam, MD for primary care. I reached out to Nathan Fernandez by phone today to offer care coordination services.  Mr. Desser was given information about Care Coordination services today including:   The Care Coordination services include support from the care team which includes your Nurse Coordinator, Clinical Social Worker, or Pharmacist.  The Care Coordination team is here to help remove barriers to the health concerns and goals most important to you. Care Coordination services are voluntary, and the patient may decline or stop services at any time by request to their care team member.   Care Coordination Consent Status: Patient agreed to services and verbal consent obtained.   Follow up plan:  Telephone appointment with care coordination team member scheduled for:  09/28/2023  Encounter Outcome:  Patient Scheduled from referral   Burman Nieves, [facility] Care Coordination Care Guide Direct Dial: [phone]

## 2023-09-21 NOTE — Telephone Encounter (Signed)
Left message on voicemail per DPR in reference to appointment on 09/23/23 with detailed instructions given per Myocardial Perfusion Study Information Sheet for the test. LM to arrive 15 minutes early, and that it is imperative to arrive on time for appointment to keep from having the test rescheduled. If you need to cancel or reschedule your appointment, please call the office within 24 hours of your appointment. Failure to do so may result in a cancellation of your appointment, and a $50 no show fee. Phone number given for call back for any questions.

## 2023-09-22 ENCOUNTER — Other Ambulatory Visit (HOSPITAL_COMMUNITY): Payer: Self-pay

## 2023-09-22 NOTE — Telephone Encounter (Signed)
PAP: Application for Jardiance has been submitted to PAP Companies: [employer], via fax

## 2023-09-23 ENCOUNTER — Ambulatory Visit (HOSPITAL_COMMUNITY): Payer: Medicare Other | Attending: Internal Medicine

## 2023-09-23 DIAGNOSIS — I25118 Atherosclerotic heart disease of native coronary artery with other forms of angina pectoris: Secondary | ICD-10-CM | POA: Diagnosis not present

## 2023-09-23 DIAGNOSIS — I639 Cerebral infarction, unspecified: Secondary | ICD-10-CM | POA: Insufficient documentation

## 2023-09-23 DIAGNOSIS — R0602 Shortness of breath: Secondary | ICD-10-CM | POA: Insufficient documentation

## 2023-09-23 LAB — MYOCARDIAL PERFUSION IMAGING
Estimated workload: 1
Exercise duration (min): 1 min
Exercise duration (sec): 0 s
LV dias vol: 109 mL (ref 62–150)
LV sys vol: 77 mL
MPHR: 137 {beats}/min
Nuc Stress EF: 30 %
Peak HR: 83 {beats}/min
Percent HR: 60 %
Rest HR: 70 {beats}/min
Rest Nuclear Isotope Dose: 10.6 mCi
SDS: 1
SRS: 10
SSS: 13
ST Depression (mm): 0 mm
Stress Nuclear Isotope Dose: 30.6 mCi
TID: 0.91

## 2023-09-23 MED ORDER — TECHNETIUM TC 99M TETROFOSMIN IV KIT
10.6000 | PACK | Freq: Once | INTRAVENOUS | Status: AC | PRN
  Administered 2023-09-23: 10.6 via INTRAVENOUS

## 2023-09-23 MED ORDER — TECHNETIUM TC 99M TETROFOSMIN IV KIT
30.6000 | PACK | Freq: Once | INTRAVENOUS | Status: AC | PRN
  Administered 2023-09-23: 30.6 via INTRAVENOUS

## 2023-09-23 MED ORDER — REGADENOSON 0.4 MG/5ML IV SOLN
0.4000 mg | Freq: Once | INTRAVENOUS | Status: AC
  Administered 2023-09-23: 0.4 mg via INTRAVENOUS

## 2023-09-25 ENCOUNTER — Encounter: Payer: Self-pay | Admitting: Physician Assistant

## 2023-09-25 ENCOUNTER — Encounter: Payer: Self-pay | Admitting: *Deleted

## 2023-09-25 ENCOUNTER — Ambulatory Visit: Payer: Medicare Other | Attending: Physician Assistant | Admitting: Physician Assistant

## 2023-09-25 VITALS — BP 120/60 | HR 71 | Ht 69.0 in | Wt 156.0 lb

## 2023-09-25 DIAGNOSIS — I10 Essential (primary) hypertension: Secondary | ICD-10-CM

## 2023-09-25 DIAGNOSIS — Z7901 Long term (current) use of anticoagulants: Secondary | ICD-10-CM | POA: Diagnosis not present

## 2023-09-25 DIAGNOSIS — R1012 Left upper quadrant pain: Secondary | ICD-10-CM

## 2023-09-25 DIAGNOSIS — R0602 Shortness of breath: Secondary | ICD-10-CM

## 2023-09-25 DIAGNOSIS — I5042 Chronic combined systolic (congestive) and diastolic (congestive) heart failure: Secondary | ICD-10-CM | POA: Diagnosis not present

## 2023-09-25 DIAGNOSIS — I25118 Atherosclerotic heart disease of native coronary artery with other forms of angina pectoris: Secondary | ICD-10-CM | POA: Diagnosis not present

## 2023-09-25 DIAGNOSIS — E785 Hyperlipidemia, unspecified: Secondary | ICD-10-CM | POA: Diagnosis not present

## 2023-09-25 NOTE — Patient Instructions (Addendum)
Medication Instructions:   START TAKING:  FUROSEMIDE AS NEEDED   *If you need a refill on your cardiac medications before your next appointment, please call your pharmacy*   Lab Work:  BMET AND CBC TODAY   If you have labs (blood work) drawn today and your tests are completely normal, you will receive your results only by: MyChart Message (if you have MyChart) OR A paper copy in the mail If you have any lab test that is abnormal or we need to change your treatment, we will call you to review the results.   Testing/Procedures:  SEE ATTACHED LETTER FOR 09-29-23 Your physician has requested that you have a cardiac catheterization. Cardiac catheterization is used to diagnose and/or treat various heart conditions. Doctors may recommend this procedure for a number of different reasons. The most common reason is to evaluate chest pain. Chest pain can be a symptom of coronary artery disease (CAD), and cardiac catheterization can show whether plaque is narrowing or blocking your heart's arteries. This procedure is also used to evaluate the valves, as well as measure the blood flow and oxygen levels in different parts of your heart. For further information please visit [email or web address] Please follow instruction sheet, as given.    Follow-Up: At [facility], you and your health needs are our priority.  As part of our continuing mission to provide you with exceptional heart care, we have created designated Provider Care Teams.  These Care Teams include your primary Cardiologist (physician) and Advanced Practice Providers (APPs -  Physician Assistants and Nurse Practitioners) who all work together to provide you with the care you need, when you need it.  We recommend signing up for the patient portal called "MyChart".  Sign up information is provided on this After Visit Summary.  MyChart is used to connect with patients for Virtual Visits (Telemedicine).  Patients are able to view lab/test  results, encounter notes, upcoming appointments, etc.  Non-urgent messages can be sent to your provider as well.   To learn more about what you can do with MyChart, go to [email or web address].    Your next appointment:   1-2 WEEKS AFTER CATH ON 09-29-23 WITH ANY AVAILABLE  APP   4-6 MONTHS   Provider:   Charlton Haws, MD     Other Instructions

## 2023-09-25 NOTE — Progress Notes (Signed)
Cardiology Office Note:  .   Date:  09/25/2023  ID:  Nathan Fernandez, DOB 05-23-1941, MRN [id number] PCP: Joaquim Nam, MD  [facility] Providers Cardiologist:  Charlton Haws, MD {  History of Present Illness: .   Nathan Fernandez is a 82 y.o. male with the below past medical history who is here for follow-up appointment.  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 Cyndie Chime in 2014.  Reported remote history of MI 20 years previously.  Presented with chest pain and had a troponins consistent with NSTEMI 02/13/2021.  LHC as outlined above.  No change in coronary anatomy from 2020 catheterization.  If no relief from symptoms, referral for CABG was recommended.  Aspirin was added to Xarelto regimen.  Noted his LAD was not suitable for PCI.  Echo 02/12/2021 showed EF 45 to 50%, global hypokinesis, mild LVH, intermediate diastolic parameters and no significant valvular abnormalities.  He was seen by Dr. Eldridge Dace 08/20/2022 as a work in for preoperative cardiovascular clearance for lumbar decompression.  Was deemed to moderate risk with no further testing required.  Today, he does not feel real steady on his feet, had an episode yesterday ad lightheaded. No falls, no passing out. He had been to get a hair cut and was at the computer and felt Weak and lightheaded. Some SOB. Left sided pain but likely  MS since its reproducible. Back problems. He has not kept close track of his BP or weight at home. He is on three diuretics and not drinking more than 8 ounces of water a day. One cup of coffee.   Reviewed his recent stress test which was positive and now he is needing a cardiac cath.   ROS: Pertinent ROS in HPI  Studies Reviewed: Marland Kitchen       Eugenie Birks Myoview 09/23/2023   Findings are consistent with infarction. The study is intermediate risk due to large perfusion defect however there is no ischemia and findings are grossly similar to prior.   No ST deviation was noted.   LV perfusion is abnormal. There is no evidence of ischemia. There is evidence of infarction. Defect 1: There is a large defect with moderate reduction in uptake present in the apical to basal anterior, apical inferior, basal and apical septal and apex location(s) that is fixed. There is abnormal wall motion in the defect area. Consistent with infarction.   Left ventricular function is abnormal. Global function is moderately reduced. Nuclear stress EF: 30%. The left ventricular ejection fraction is moderately decreased (30-44%). End diastolic cavity size is normal. End systolic cavity size is mildly enlarged. No evidence of transient ischemic dilation (TID) noted.   Prior study available for comparison from 01/12/2019. No changes compared to prior study.      Physical Exam:   VS:  BP 120/60   Pulse 71   Ht 5\' 9"  (1.753 m)  Wt 156 lb (70.8 kg)   SpO2 94%   BMI 23.04 kg/m    Wt Readings from Last 3 Encounters:  09/25/23 156 lb (70.8 kg)  09/23/23 161 lb (73 kg)  09/18/23 161 lb 13.1 oz (73.4 kg)    GEN: Well nourished, well developed in no acute distress NECK: No JVD; No carotid bruits CARDIAC: RRR, no murmurs, rubs, gallops RESPIRATORY:  Clear to auscultation without rales, wheezing or rhonchi  ABDOMEN: Soft, non-tender, non-distended EXTREMITIES:  No edema; No deformity   ASSESSMENT AND PLAN: .    1.  Shortness of  breath/lightheadedness/dizziness -This remains an issue -Plan for cardiac catheterization early next week -He is on 3 diuretics: Jardiance 10 mg daily, Lasix 20 mg daily, spiro 12.5 mg daily -I think that he is likely getting orthostatic since he is not drinking much fluid throughout the day Recommended increasing water intake to at least 36 ounces a day and switching Lasix to as needed  3.  CAD -set him up for a cardiac cath -no chest pain today -reviewed his most recent stress test with the patient -continue current medications  4.  Hyperlipidemia -triglycerides 109, LDL 48, HDL 42 -continue current medications  5.  HTN -BP well controlled -continue low sodium, heart healthy diet -continue current medications -please track BP at home with omeron cuff  6.  Chronic combined CHF -euvolemic on exam today -continue current medications  7.  Chronic a/c -xarelto will need held x 2 days prior to cath  The patient understands that risks include but are not limited to stroke (1 in 1000), death (1 in 1000), kidney failure [usually temporary] (1 in 500), bleeding (1 in 200), allergic reaction [possibly serious] (1 in 200), and agrees to proceed.       Dispo: Follow-up with APP after cath  Signed, Sharlene Dory, PA-C

## 2023-09-26 LAB — CBC
Hematocrit: 42.9 % (ref 37.5–51.0)
Hemoglobin: 13.6 g/dL (ref 13.0–17.7)
MCH: 30.6 pg (ref 26.6–33.0)
MCHC: 31.7 g/dL (ref 31.5–35.7)
MCV: 96 fL (ref 79–97)
Platelets: 231 10*3/uL (ref 150–450)
RBC: 4.45 x10E6/uL (ref 4.14–5.80)
RDW: 12.9 % (ref 11.6–15.4)
WBC: 7.6 10*3/uL (ref 3.4–10.8)

## 2023-09-26 LAB — BASIC METABOLIC PANEL
BUN/Creatinine Ratio: 20 (ref 10–24)
BUN: 28 mg/dL — ABNORMAL HIGH (ref 8–27)
CO2: 28 mmol/L (ref 20–29)
Calcium: 9.2 mg/dL (ref 8.6–10.2)
Chloride: 100 mmol/L (ref 96–106)
Creatinine, Ser: 1.4 mg/dL — ABNORMAL HIGH (ref 0.76–1.27)
Glucose: 135 mg/dL — ABNORMAL HIGH (ref 70–99)
Potassium: 4.9 mmol/L (ref 3.5–5.2)
Sodium: 140 mmol/L (ref 134–144)
eGFR: 50 mL/min/{1.73_m2} — ABNORMAL LOW (ref >59)

## 2023-09-28 ENCOUNTER — Ambulatory Visit: Payer: Self-pay

## 2023-09-28 ENCOUNTER — Telehealth: Payer: Self-pay | Admitting: *Deleted

## 2023-09-28 NOTE — Telephone Encounter (Addendum)
Cardiac Catheterization scheduled at [facility] for: Tuesday September 29, 2023 9 AM Arrival time [facility] Main Entrance A at: 7 AM  Nothing to eat after midnight prior to procedure, clear liquids until 5 AM day of procedure.  Medication instructions: -Hold:  Xarelto-none starting 09/27/23 until post procedure  Lasix/Spironolactone-day before and day of procedure -per protocol GFR < 60 (50)  Jardiance-AM of procedure -Other usual morning medications can be taken with sips of water including aspirin 81 mg.  Plan to go home the same day, you will only stay overnight if medically necessary.  You must have responsible adult to drive you home.  Someone must be with you the first 24 hours after you arrive home.  Reviewed procedure instructions with patient's wife (DPR).

## 2023-09-28 NOTE — Patient Outreach (Signed)
Care Coordination   Initial Visit Note   09/28/2023 Name: Nathan Fernandez MRN: [id number] DOB: 1941-05-27  [name] is a 82 y.o. year old male who sees Joaquim Nam, MD for primary care. I spoke with  spouse/ designated party release, Ulyses Amor by phone today.  What matters to the patients health and wellness today?  Wife states patient is doing ok. She reports patient having an episode on 09/24/23 while sitting at the table looking at his computer of overall weakness and lightheadedness.  Wife states patient has since been monitoring his BP and oxygen level.  She reports blood pressure ranges in the 130/70's and pulse 93.  She states patients weight today is 155 lbs. Wife denies patient having a pacemaker.  She states patient has a loop recorder.  Wife states patient has oxygen at the house but does not use it.    Goals Addressed             This Visit's Progress    Continued improvement post hospitalization and work with RN case manager to gain knowledge and understanding for managing health conditions.       Interventions Today    Flowsheet Row Most Recent Value  Chronic Disease   Chronic disease during today's visit Congestive Heart Failure (CHF)  General Interventions   General Interventions Discussed/Reviewed General Interventions Discussed, Doctor Visits  [evaluation of current treatment plan for HF and patients adherence to plan as established by provider.  Assessed for HF symptoms and assessed current weight.  Assessed for new episodes of lightheadedness/ dizziness]  Doctor Visits Discussed/Reviewed Doctor Visits Discussed  [discussed cardiology visit from 09/25/23. Discussed upcoming provider visits. Advised to keep follow up appointments with providers.]  Exercise Interventions   Exercise Discussed/Reviewed Physical Activity  [Assessed patients physical activity]  Education Interventions   Education Provided Provided Education, Provided Printed Education  [Reviewed  HF symptoms and action plan. Advised to call provider for mild/ moderate symptoms / weight gain 3 lbs over night or 5 lbs in a week, increase in swelling and SOB.]  Provided Verbal Education On Other  [Advised to monitor oxygen level daily and record. Advised that patient should use oxygen if O2 saturation below 90. Advised to monitor BP daily and record. Advised to use BP monitor with cuff that wraps around arm.]  Nutrition Interventions   Nutrition Discussed/Reviewed Decreasing salt, Nutrition Discussed  [Advised to increase fluid intake.]  Pharmacy Interventions   Pharmacy Dicussed/Reviewed Pharmacy Topics Discussed  Algis Downs to take medication as prescribed. Contact provider if you have questions / concerns regarding medications.]              SDOH assessments and interventions completed:  Yes  SDOH Interventions Today    Flowsheet Row Most Recent Value  SDOH Interventions   Food Insecurity Interventions Intervention Not Indicated  Housing Interventions Intervention Not Indicated  Transportation Interventions Intervention Not Indicated        Care Coordination Interventions:  Yes, provided   Follow up plan: Follow up call scheduled for 10/16/23    Encounter Outcome:  Patient Visit Completed   George Ina RN,BSN,CCM [facility] Care Coordination [phone] direct line

## 2023-09-28 NOTE — Patient Instructions (Signed)
Visit Information  Thank you for taking time to visit with me today. Please don't hesitate to contact me if I can be of assistance to you.   Following are the goals we discussed today:   Goals Addressed             This Visit's Progress    Continued improvement post hospitalization and work with RN case manager to gain knowledge and understanding for managing health conditions.       Interventions Today    Flowsheet Row Most Recent Value  Chronic Disease   Chronic disease during today's visit Congestive Heart Failure (CHF)  General Interventions   General Interventions Discussed/Reviewed General Interventions Discussed, Doctor Visits  [evaluation of current treatment plan for HF and patients adherence to plan as established by provider.  Assessed for HF symptoms and assessed current weight.  Assessed for new episodes of lightheadedness/ dizziness]  Doctor Visits Discussed/Reviewed Doctor Visits Discussed  [discussed cardiology visit from 09/25/23. Discussed upcoming provider visits. Advised to keep follow up appointments with providers.]  Exercise Interventions   Exercise Discussed/Reviewed Physical Activity  [Assessed patients physical activity]  Education Interventions   Education Provided Provided Education, Provided Printed Education  [Reviewed HF symptoms and action plan. Advised to call provider for mild/ moderate symptoms / weight gain 3 lbs over night or 5 lbs in a week, increase in swelling and SOB.]  Provided Verbal Education On Other  [Advised to monitor oxygen level daily and record. Advised that patient should use oxygen if O2 saturation below 90. Advised to monitor BP daily and record. Advised to use BP monitor with cuff that wraps around arm.]  Nutrition Interventions   Nutrition Discussed/Reviewed Decreasing salt, Nutrition Discussed  [Advised to increase fluid intake.]  Pharmacy Interventions   Pharmacy Dicussed/Reviewed Pharmacy Topics Discussed  Algis Downs to take  medication as prescribed. Contact provider if you have questions / concerns regarding medications.]              Our next appointment is by telephone on 10/16/23 at 10:30 am  Please call the care guide team at [phone] if you need to cancel or reschedule your appointment.   If you are experiencing a Mental Health or Behavioral Health Crisis or need someone to talk to, please call the Suicide and Crisis Lifeline: 988 call 1-800-273-TALK (toll free, 24 hour hotline)  Patient verbalizes understanding of instructions and care plan provided today and agrees to view in MyChart. Active MyChart status and patient understanding of how to access instructions and care plan via MyChart confirmed with patient.     Nathan Ina RN,BSN,CCM [facility] Care Coordination [phone] direct line  Heart Failure Action Plan A heart failure action plan helps you understand what to do when you have symptoms of heart failure. Your action plan is a color-coded plan that lists the symptoms to watch for and indicates what actions to take. If you have symptoms in the red zone, you need medical care right away. If you have symptoms in the yellow zone, you are having problems. If you have symptoms in the Nathan Fernandez zone, you are doing well. Follow the plan that was created by you and your health care provider. Review your plan each time you visit your health care provider. Red zone These signs and symptoms mean you should get medical help right away: You have trouble breathing when resting. You have a dry cough that is getting worse. You have swelling or pain in your legs or abdomen that is getting worse.  You suddenly gain more than 2-3 lb (0.9-1.4 kg) in 24 hours, or more than 5 lb (2.3 kg) in a week. This amount may be more or less depending on your condition. You have trouble staying awake or you feel confused. You have chest pain. You do not have an appetite. You pass out. You have worsening sadness or  depression. If you have any of these symptoms, call your local emergency services (911 in the [place] right away. Do not drive yourself to the hospital. Yellow zone These signs and symptoms mean your condition may be getting worse and you should make some changes: You have trouble breathing when you are active, or you need to sleep with your head raised on extra pillows to help you breathe. You have swelling in your legs or abdomen. You gain 2-3 lb (0.9-1.4 kg) in 24 hours, or 5 lb (2.3 kg) in a week. This amount may be more or less depending on your condition. You get tired easily. You have trouble sleeping. You have a dry cough. If you have any of these symptoms: Contact your health care provider within the next day. Your health care provider may adjust your medicines. Nathan Fernandez zone These signs mean you are doing well and can continue what you are doing: You do not have shortness of breath. You have very little swelling or no new swelling. Your weight is stable (no gain or loss). You have a normal activity level. You do not have chest pain or any other new symptoms. Follow these instructions at home: Take over-the-counter and prescription medicines only as told by your health care provider. Weigh yourself daily. Your target weight is __________ lb (__________ kg). Call your health care provider if you gain more than __________ lb (__________ kg) in 24 hours, or more than __________ lb (__________ kg) in a week. Health care provider name: _____________________________________________________ Health care provider phone number: _____________________________________________________ Eat a heart-healthy diet. Work with a diet and nutrition specialist (dietitian) to create an eating plan that is best for you. Keep all follow-up visits. This is important. Where to find more information American Heart Association: Summary A heart failure action plan helps you understand what to do when you have  symptoms of heart failure. Follow the action plan that was created by you and your health care provider. Get help right away if you have any symptoms in the red zone. This information is not intended to replace advice given to you by your health care provider. Make sure you discuss any questions you have with your health care provider. Document Revised: 03/18/2022 Document Reviewed: 07/23/2020 Elsevier Patient Education  2024 Elsevier Inc.  Heart Failure, Self-Care Heart failure is a serious condition. The following information explains things you need to do to take care of yourself at home. To help you stay as healthy as possible, you may be asked to change your diet, take certain medicines, and make other changes in your life. Your doctor may also give you more specific instructions. If you have problems or questions, call your doctor. What are the risks? Having heart failure makes it more likely for you to have some problems. These problems can get worse if you do not take good care of yourself. Problems may include: Damage to the kidneys, liver, or lungs. Malnutrition. Abnormal heart rhythms. Blood clotting problems that could cause a stroke. Supplies needed: Scale for weighing yourself. Blood pressure monitor. Notebook. Medicines. How to care for yourself when you have heart failure Medicines Take over-the-counter  and prescription medicines only as told by your doctor. Take your medicines every day. Do not stop taking your medicine unless your doctor tells you to do so. Do not skip any medicines. Get your prescriptions refilled before you run out of medicine. This is important. Talk with your doctor if you cannot afford your medicines. Eating and drinking  Eat heart-healthy foods. Talk with a diet specialist (dietitian) to create an eating plan. Limit salt (sodium) if told by your doctor. Ask your diet specialist to tell you which seasonings are healthy for your heart. Cook in  healthy ways instead of frying. Healthy ways of cooking include roasting, grilling, broiling, baking, poaching, steaming, and stir-frying. Choose foods that: Have no trans fat. Are low in saturated fat and cholesterol. Choose healthy foods, such as: Fresh or frozen fruits and vegetables. Fish. Low-fat (lean) meats. Legumes, such as beans, peas, and lentils. Fat-free or low-fat dairy products. Whole-grain foods. High-fiber foods. Limit how much fluid you drink, if told by your doctor. Alcohol use Do not drink alcohol if: Your doctor tells you not to drink. Your heart was damaged by alcohol, or you have very bad heart failure. You are pregnant, may be pregnant, or are planning to become pregnant. If you drink alcohol: Limit how much you have to: 0-1 drink a day for women. 0-2 drinks a day for men. Know how much alcohol is in your drink. In the [place] one drink equals one 12 oz bottle of beer (355 mL), one 5 oz glass of wine (148 mL), or one 1 oz glass of hard liquor (44 mL). Lifestyle  Do not smoke or use any products that contain nicotine or tobacco. If you need help quitting, ask your doctor. Do not use nicotine gum or patches before talking to your doctor. Do not use illegal drugs. Lose weight if told by your doctor. Do physical activity if told by your doctor. Talk to your doctor before you begin an exercise if: You are an older adult. You have very bad heart failure. Learn to manage stress. If you need help, ask your doctor. Get physical rehab (rehabilitation) to help you stay independent and to help with your quality of life. Participate in a cardiac rehab program. This program helps you improve your health through exercise, education, and counseling. Plan time to rest when you get tired. Check weight and blood pressure  Weigh yourself every day. This will help you to know if fluid is building up in your body. Weigh yourself every morning after you pee (urinate) and before  you eat breakfast. Wear the same amount of clothing each time. Write down your daily weight. Give your record to your doctor. Check and write down your blood pressure as told by your doctor. Check your pulse as told by your doctor. Dealing with very hot and very cold weather If it is very hot: Avoid activities that take a lot of energy. Use air conditioning or fans, or find a cooler place. Avoid caffeine and alcohol. Wear clothing that is loose-fitting, lightweight, and light-colored. If it is very cold: Avoid activities that take a lot of energy. Layer your clothes. Wear mittens or gloves, a hat, and a face covering when you go outside. Avoid alcohol. Follow these instructions at home: Stay up to date with shots (vaccines). Get pneumococcal and flu (influenza) shots. Keep all follow-up visits. Contact a doctor if: You gain 2-3 lb (1-1.4 kg) in 24 hours or 5 lb (2.3 kg) in a week. You have  increasing shortness of breath. You cannot do your normal activities. You get tired easily. You cough a lot. You do not feel like eating or feel like you may vomit (nauseous). You have swelling in your hands, feet, ankles, or belly (abdomen). You cannot sleep well because it is hard to breathe. You feel like your heart is beating fast (palpitations). You get dizzy when you stand up. You feel depressed or sad. Get help right away if: You have trouble breathing. You or someone else notices a change in your behavior, such as having trouble staying awake. You have chest pain or discomfort. You pass out (faint). These symptoms may be an emergency. Get help right away. Call your local emergency services (911 in the [place] Do not wait to see if the symptoms will go away. Do not drive yourself to the hospital. Summary Heart failure is a serious condition. To care for yourself, you may have to change your diet, take medicines, and make other lifestyle changes. Take your medicines every day. Do not  stop taking them unless your doctor tells you to do so. Limit salt and eat heart-healthy foods. Ask your doctor if you can drink alcohol. You may have to stop alcohol use if you have very bad heart failure. Contact your doctor if you gain weight quickly or feel that your heart is beating too fast. Get help right away if you pass out or have chest pain or trouble breathing. This information is not intended to replace advice given to you by your health care provider. Make sure you discuss any questions you have with your health care provider. Document Revised: 06/30/2020 Document Reviewed: 06/30/2020 Elsevier Patient Education  2024 [employer].

## 2023-09-29 ENCOUNTER — Ambulatory Visit (HOSPITAL_COMMUNITY)
Admission: RE | Admit: 2023-09-29 | Discharge: 2023-09-29 | Disposition: A | Discharge status: Home / Self Care | Payer: Medicare Other | Attending: Cardiology | Admitting: Cardiology

## 2023-09-29 ENCOUNTER — Encounter (HOSPITAL_COMMUNITY)
Admission: RE | Disposition: A | Discharge status: Home / Self Care | Payer: Self-pay | Source: Home / Self Care | Attending: Cardiology

## 2023-09-29 ENCOUNTER — Other Ambulatory Visit: Payer: Self-pay

## 2023-09-29 ENCOUNTER — Encounter (HOSPITAL_COMMUNITY): Payer: Medicare Other

## 2023-09-29 DIAGNOSIS — I5042 Chronic combined systolic (congestive) and diastolic (congestive) heart failure: Secondary | ICD-10-CM | POA: Diagnosis not present

## 2023-09-29 DIAGNOSIS — I252 Old myocardial infarction: Secondary | ICD-10-CM | POA: Insufficient documentation

## 2023-09-29 DIAGNOSIS — I2510 Atherosclerotic heart disease of native coronary artery without angina pectoris: Secondary | ICD-10-CM | POA: Diagnosis not present

## 2023-09-29 DIAGNOSIS — E785 Hyperlipidemia, unspecified: Secondary | ICD-10-CM | POA: Diagnosis not present

## 2023-09-29 DIAGNOSIS — R0602 Shortness of breath: Secondary | ICD-10-CM | POA: Diagnosis not present

## 2023-09-29 DIAGNOSIS — Z7901 Long term (current) use of anticoagulants: Secondary | ICD-10-CM | POA: Insufficient documentation

## 2023-09-29 DIAGNOSIS — Z86718 Personal history of other venous thrombosis and embolism: Secondary | ICD-10-CM | POA: Diagnosis not present

## 2023-09-29 DIAGNOSIS — I2584 Coronary atherosclerosis due to calcified coronary lesion: Secondary | ICD-10-CM | POA: Insufficient documentation

## 2023-09-29 DIAGNOSIS — R42 Dizziness and giddiness: Secondary | ICD-10-CM | POA: Diagnosis not present

## 2023-09-29 DIAGNOSIS — R079 Chest pain, unspecified: Secondary | ICD-10-CM | POA: Diagnosis present

## 2023-09-29 DIAGNOSIS — I110 Hypertensive heart disease with heart failure: Secondary | ICD-10-CM | POA: Diagnosis not present

## 2023-09-29 DIAGNOSIS — E119 Type 2 diabetes mellitus without complications: Secondary | ICD-10-CM | POA: Insufficient documentation

## 2023-09-29 DIAGNOSIS — Z79899 Other long term (current) drug therapy: Secondary | ICD-10-CM | POA: Diagnosis not present

## 2023-09-29 HISTORY — PX: RIGHT/LEFT HEART CATH AND CORONARY ANGIOGRAPHY: CATH118266

## 2023-09-29 LAB — POCT I-STAT 7, (LYTES, BLD GAS, ICA,H+H)
Acid-base deficit: 3 mmol/L — ABNORMAL HIGH (ref 0.0–2.0)
Bicarbonate: 23.3 mmol/L (ref 20.0–28.0)
Calcium, Ion: 1.24 mmol/L (ref 1.15–1.40)
HCT: 36 % — ABNORMAL LOW (ref 39.0–52.0)
Hemoglobin: 12.2 g/dL — ABNORMAL LOW (ref 13.0–17.0)
O2 Saturation: 94 %
Potassium: 4.4 mmol/L (ref 3.5–5.1)
Sodium: 141 mmol/L (ref 135–145)
TCO2: 25 mmol/L (ref 22–32)
pCO2 arterial: 43.5 mm[Hg] (ref 32–48)
pH, Arterial: 7.338 — ABNORMAL LOW (ref 7.35–7.45)
pO2, Arterial: 76 mm[Hg] — ABNORMAL LOW (ref 83–108)

## 2023-09-29 LAB — GLUCOSE, CAPILLARY: Glucose-Capillary: 135 mg/dL — ABNORMAL HIGH (ref 70–99)

## 2023-09-29 LAB — POCT I-STAT EG7
Acid-base deficit: 2 mmol/L (ref 0.0–2.0)
Bicarbonate: 24.9 mmol/L (ref 20.0–28.0)
Calcium, Ion: 1.28 mmol/L (ref 1.15–1.40)
HCT: 37 % — ABNORMAL LOW (ref 39.0–52.0)
Hemoglobin: 12.6 g/dL — ABNORMAL LOW (ref 13.0–17.0)
O2 Saturation: 69 %
Potassium: 4.5 mmol/L (ref 3.5–5.1)
Sodium: 141 mmol/L (ref 135–145)
TCO2: 26 mmol/L (ref 22–32)
pCO2, Ven: 49 mm[Hg] (ref 44–60)
pH, Ven: 7.313 (ref 7.25–7.43)
pO2, Ven: 39 mm[Hg] (ref 32–45)

## 2023-09-29 SURGERY — RIGHT/LEFT HEART CATH AND CORONARY ANGIOGRAPHY
Anesthesia: LOCAL

## 2023-09-29 MED ORDER — FENTANYL CITRATE (PF) 100 MCG/2ML IJ SOLN
INTRAMUSCULAR | Status: AC
  Filled 2023-09-29: qty 2

## 2023-09-29 MED ORDER — HEPARIN SODIUM (PORCINE) 1000 UNIT/ML IJ SOLN
INTRAMUSCULAR | Status: AC
  Filled 2023-09-29: qty 10

## 2023-09-29 MED ORDER — SODIUM CHLORIDE 0.9 % IV SOLN
INTRAVENOUS | Status: DC | PRN
  Administered 2023-09-29: 10 mL/h via INTRAVENOUS

## 2023-09-29 MED ORDER — MIDAZOLAM HCL 2 MG/2ML IJ SOLN
INTRAMUSCULAR | Status: AC
  Filled 2023-09-29: qty 2

## 2023-09-29 MED ORDER — SODIUM CHLORIDE 0.9 % IV SOLN: INTRAVENOUS | Status: DC

## 2023-09-29 MED ORDER — LIDOCAINE HCL (PF) 1 % IJ SOLN
INTRAMUSCULAR | Status: DC | PRN
  Administered 2023-09-29 (×2): 2 mL

## 2023-09-29 MED ORDER — ASPIRIN 81 MG PO CHEW: 81.0000 mg | CHEWABLE_TABLET | ORAL | Status: DC

## 2023-09-29 MED ORDER — HEPARIN (PORCINE) IN NACL 1000-0.9 UT/500ML-% IV SOLN
INTRAVENOUS | Status: DC | PRN
  Administered 2023-09-29 (×2): 500 mL

## 2023-09-29 MED ORDER — MIDAZOLAM HCL 2 MG/2ML IJ SOLN
INTRAMUSCULAR | Status: DC | PRN
  Administered 2023-09-29: 1 mg via INTRAVENOUS

## 2023-09-29 MED ORDER — IOHEXOL 350 MG/ML SOLN
INTRAVENOUS | Status: DC | PRN
  Administered 2023-09-29: 25 mL

## 2023-09-29 MED ORDER — FENTANYL CITRATE (PF) 100 MCG/2ML IJ SOLN
INTRAMUSCULAR | Status: DC | PRN
  Administered 2023-09-29: 25 ug via INTRAVENOUS

## 2023-09-29 MED ORDER — VERAPAMIL HCL 2.5 MG/ML IV SOLN
INTRAVENOUS | Status: AC
  Filled 2023-09-29: qty 2

## 2023-09-29 MED ORDER — VERAPAMIL HCL 2.5 MG/ML IV SOLN
INTRAVENOUS | Status: DC | PRN
  Administered 2023-09-29: 10 mL via INTRA_ARTERIAL

## 2023-09-29 MED ORDER — LIDOCAINE HCL (PF) 1 % IJ SOLN
INTRAMUSCULAR | Status: AC
  Filled 2023-09-29: qty 30

## 2023-09-29 MED ORDER — HEPARIN SODIUM (PORCINE) 1000 UNIT/ML IJ SOLN
INTRAMUSCULAR | Status: DC | PRN
  Administered 2023-09-29: 3500 [IU] via INTRAVENOUS

## 2023-09-29 SURGICAL SUPPLY — 12 items
CATH BALLN WEDGE 5F 110CM (CATHETERS) IMPLANT
CATH INFINITI 5 FR JL3.5 (CATHETERS) IMPLANT
CATH INFINITI AMBI 5FR TG (CATHETERS) IMPLANT
DEVICE RAD COMP TR BAND LRG (VASCULAR PRODUCTS) IMPLANT
GLIDESHEATH SLEND A-KIT 6F 22G (SHEATH) IMPLANT
GUIDEWIRE INQWIRE 1.5J.035X260 (WIRE) IMPLANT
INQWIRE 1.5J .035X260CM (WIRE) ×1
PACK CARDIAC CATHETERIZATION (CUSTOM PROCEDURE TRAY) ×2 IMPLANT
SET ATX-X65L (MISCELLANEOUS) IMPLANT
SHEATH GLIDE SLENDER 4/5FR (SHEATH) IMPLANT
SHEATH PROBE COVER 6X72 (BAG) IMPLANT
WIRE EMERALD 3MM-J .025X260CM (WIRE) IMPLANT

## 2023-09-29 NOTE — Progress Notes (Signed)
Patient and wife was given discharge instructions. Both verbalized understanding. 

## 2023-09-29 NOTE — Discharge Instructions (Signed)

## 2023-09-29 NOTE — H&P (Signed)
OV 10/4 copied for documentation   Cardiology Office Note:  .   Date:  09/29/2023  ID:  Nathan Fernandez, DOB 02-03-1941, MRN [id number] PCP: Joaquim Nam, MD  [facility] Providers Cardiologist:  Charlton Haws, MD {  History of Present Illness: .   Nathan Fernandez is a 82 y.o. male with the below past medical history who is here for follow-up appointment.  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 Cyndie Chime in 2014.  Reported remote history of MI 20 years previously.  Presented with chest pain and had a troponins consistent with NSTEMI 02/13/2021.  LHC as outlined above.  No change in coronary anatomy from 2020 catheterization.  If no relief from symptoms, referral for CABG was recommended.  Aspirin was added to Xarelto regimen.  Noted his LAD was not suitable for PCI.  Echo 02/12/2021 showed EF 45 to 50%, global hypokinesis, mild LVH, intermediate diastolic parameters and no significant valvular abnormalities.  He was seen by Dr. Eldridge Dace 08/20/2022 as a work in for preoperative cardiovascular clearance for lumbar decompression.  Was deemed to moderate risk with no further testing required.  Today, he does not feel real steady on his feet, had an episode yesterday ad lightheaded. No falls, no passing out. He had been to get a hair cut and was at the computer and felt Weak and lightheaded.  Some SOB. Left sided pain but likely MS since its reproducible. Back problems. He has not kept close track of his BP or weight at home. He is on three diuretics and not drinking more than 8 ounces of water a day. One cup of coffee.   Reviewed his recent stress test which was positive and now he is needing a cardiac cath.   ROS: Pertinent ROS in HPI  Studies Reviewed: Marland Kitchen       Eugenie Birks Myoview 09/23/2023   Findings are consistent with infarction. The study is intermediate risk due to large perfusion defect however there is no ischemia and findings are grossly similar to prior.   No ST deviation was noted.   LV perfusion is abnormal. There is no evidence of ischemia. There is evidence of infarction. Defect 1: There is a large defect with moderate reduction in uptake present in the apical to basal anterior, apical inferior, basal and apical septal and apex location(s) that is fixed. There is abnormal wall motion in the defect area. Consistent with infarction.   Left ventricular function is abnormal. Global function is moderately reduced. Nuclear stress EF: 30%. The left ventricular ejection fraction is moderately decreased (30-44%). End diastolic cavity size is normal. End systolic cavity size is mildly enlarged. No evidence of transient ischemic dilation (TID) noted.   Prior study available for comparison from 01/12/2019. No changes compared to prior study.      Physical Exam:   VS:  BP (!) 146/91   Pulse  75   Temp 97.9 F (36.6 C) (Oral)   Resp 16   Ht 5\' 9"  (1.753 m)   Wt 71.2 kg   SpO2 91%   BMI 23.18 kg/m    Wt Readings from Last 3 Encounters:  09/29/23 71.2 kg  09/25/23 70.8 kg  09/23/23 73 kg    GEN: Well nourished, well developed in no acute distress NECK: No JVD; No carotid bruits CARDIAC: RRR, no murmurs, rubs, gallops RESPIRATORY:  Clear to auscultation without rales, wheezing or rhonchi  ABDOMEN: Soft, non-tender, non-distended EXTREMITIES:  No edema; No deformity    ASSESSMENT AND PLAN: .    1.  Shortness of breath/lightheadedness/dizziness -This remains an issue -Plan for cardiac catheterization early next week -He is on 3 diuretics: Jardiance 10 mg daily, Lasix 20 mg daily, spiro 12.5 mg daily -I think that he is likely getting orthostatic since he is not drinking much fluid throughout the day Recommended increasing water intake to at least 36 ounces a day and switching Lasix to as needed  3.  CAD -set him up for a cardiac cath -no chest pain today -reviewed his most recent stress test with the patient -continue current medications  4.  Hyperlipidemia -triglycerides 109, LDL 48, HDL 42 -continue current medications  5.  HTN -BP well controlled -continue low sodium, heart healthy diet -continue current medications -please track BP at home with omeron cuff  6.  Chronic combined CHF -euvolemic on exam today -continue current medications  7.  Chronic a/c -xarelto will need held x 2 days prior to cath  The patient understands that risks include but are not limited to stroke (1 in 1000), death (1 in 1000), kidney failure [usually temporary] (1 in 500), bleeding (1 in 200), allergic reaction [possibly serious] (1 in 200), and agrees to proceed.       Dispo: Follow-up with APP after cath  Signed, Elder Negus, MD

## 2023-09-29 NOTE — Interval H&P Note (Signed)
History and Physical Interval Note:  09/29/2023 9:11 AM  Nathan Fernandez  has presented today for surgery, with the diagnosis of chest pain.  The various methods of treatment have been discussed with the patient and family. After consideration of risks, benefits and other options for treatment, the patient has consented to  Procedure(s): RIGHT/LEFT HEART CATH AND CORONARY ANGIOGRAPHY (N/A) as a surgical intervention.  The patient's history has been reviewed, patient examined, no change in status, stable for surgery.  I have reviewed the patient's chart and labs.  Questions were answered to the patient's satisfaction.     Edye Hainline J Shavy Beachem

## 2023-09-30 ENCOUNTER — Encounter (HOSPITAL_COMMUNITY): Payer: Self-pay | Admitting: Cardiology

## 2023-09-30 NOTE — Telephone Encounter (Signed)
Contacted [employer] for status update. They are currently experiencing a backlog of applications and are estimating a 2-3 week turnaround. Will continue to follow up.

## 2023-10-05 ENCOUNTER — Ambulatory Visit (INDEPENDENT_AMBULATORY_CARE_PROVIDER_SITE_OTHER): Payer: Medicare Other

## 2023-10-05 DIAGNOSIS — I639 Cerebral infarction, unspecified: Secondary | ICD-10-CM | POA: Diagnosis not present

## 2023-10-06 LAB — CUP PACEART REMOTE DEVICE CHECK
Date Time Interrogation Session: 20241013230541
Implantable Pulse Generator Implant Date: 20210525

## 2023-10-13 NOTE — Progress Notes (Deleted)
Cardiology Office Note:  .   Date:  10/13/2023  ID:  Nathan Fernandez, DOB 08-29-1941, MRN [id number] PCP: Nathan Nam, MD  [facility] Providers Cardiologist:  Nathan Haws, MD {  History of Present Illness: .   Nathan Fernandez is a 82 y.o. male with the below past medical history who is here for follow-up appointment.  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 Cyndie Fernandez in 2014.  Reported remote history of MI 20 years previously.  Presented with chest pain and had a troponins consistent with NSTEMI 02/13/2021.  LHC as outlined above.  No change in coronary anatomy from 2020 catheterization.  If no relief from symptoms, referral for CABG was recommended.  Aspirin was added to Xarelto regimen.  Noted his LAD was not suitable for PCI.  Echo 02/12/2021 showed EF 45 to 50%, global hypokinesis, mild LVH, intermediate diastolic parameters and no significant valvular abnormalities.  He was seen by Dr. Eldridge Fernandez 08/20/2022 as a work in for preoperative cardiovascular clearance for lumbar decompression.  Was deemed to moderate risk with no further testing required.  He was seen by me 10//24, he does not feel real steady on his feet, had an episode yesterday ad lightheaded. No falls, no passing out. He had been to get a hair cut and was at the computer and felt Weak and lightheaded. Some SOB. Left  sided pain but likely MS since its reproducible. Back problems. He has not kept close track of his BP or weight at home. He is on three diuretics and not drinking more than 8 ounces of water a day. One cup of coffee.    He underwent cardiac catheterization and recommendations stated that he is not reviewing not suitable for PCI.  And CABG may not be ideal given large areas of infarct without ischemia.  Recommend medical management for HFmrEF and also workup pulmonary cause for dyspnea.  Today, he***  ROS: Pertinent ROS in HPI  Studies Reviewed: Marland Kitchen       Coronary angiography 09/29/2023: LM: Ostial 50%, mid 20%, sital 40% stenoses LAD: Ectatic vessel with multiple aneurysmal segments.      Prox-mid 70% stenoses LCx: Ectatic vessel with multiple aneurysmal segments.      Prox 70^, mid 40% stenoses RCA: Ectatic vessel with multiple aneurysmal segments.      Prox and mid 60% stenoses with moderate calcification   Right heart catheterization 09/29/2023: RA: 6 mmHg RV: 37/1 mmHg PA: 42/15 mmHg, mPAP 27 mmHg PCW: 8 mmHg   CO: 5.3 L/min CI: 2.8 L/min/m2   Recommendation: Except minimal progression in distal LM and prox LCx, coronary anatomy is essentially unchanged compared to previous cath in 2022. Hemodynamically well compensated at baseline, but primary symptom is exertional dyspnea without any chest pain. While LAD is patent, stress test suggests large area of infarct, without any ischemia in apical to basal anterior,  apical inferior, basal and apical septal and apex location(s) that is fixed. Anatomy is not suitable for PCI, as previously stated. Either PCI or CABG may not be ideal given large areas of infarct without ischemia, anyway. Overall, recommend medical management for HFmrEF. 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   Eugenie Birks Myoview 09/23/2023   Findings are consistent with infarction. The study is  intermediate risk due to large perfusion defect however there is no ischemia and findings are grossly similar to prior.   No ST deviation was noted.   LV perfusion is abnormal. There is no evidence of ischemia. There is evidence of infarction. Defect 1: There is a large defect with moderate reduction in uptake present in the apical to basal anterior, apical inferior, basal and apical septal and apex location(s) that is fixed. There is abnormal wall motion in the defect area. Consistent with infarction.   Left ventricular function is abnormal. Global function is moderately reduced. Nuclear stress EF: 30%. The left ventricular ejection fraction is moderately decreased (30-44%). End diastolic cavity size is normal. End systolic cavity size is mildly enlarged. No evidence of transient ischemic dilation (TID) noted.   Prior study available for comparison from 01/12/2019. No changes compared to prior study.      Physical Exam:   VS:  There were no vitals taken for this visit.   Wt Readings from Last 3 Encounters:  09/29/23 157 lb (71.2 kg)  09/25/23 156 lb (70.8 kg)  09/23/23 161 lb (73 kg)    GEN: Well nourished, well developed in no acute distress NECK: No JVD; No carotid bruits CARDIAC: RRR, no murmurs, rubs, gallops RESPIRATORY:  Clear to auscultation without rales, wheezing or rhonchi  ABDOMEN: Soft, non-tender, non-distended EXTREMITIES:  No edema; No deformity   ASSESSMENT AND PLAN: .    1.  Shortness of breath/lightheadedness/dizziness -This remains an issue -Plan for cardiac catheterization early next week -He is on 3 diuretics: Jardiance 10 mg daily, Lasix 20 mg daily, spiro 12.5 mg daily -I think that he is likely getting orthostatic since he is not drinking much fluid throughout the day Recommended increasing water intake to at least 36 ounces a day and switching Lasix to as needed  3.  CAD -set him up for a cardiac cath -no chest pain today -reviewed his most recent stress test  with the patient -continue current medications  4.  Hyperlipidemia -triglycerides 109, LDL 48, HDL 42 -continue current medications  5.  HTN -BP well controlled -continue low sodium, heart healthy diet -continue current medications -please track BP at home with omeron cuff  6.  Chronic combined CHF -euvolemic on exam today -continue current medications  7.  Chronic a/c -xarelto will need held x 2 days prior to cath  Dispo: Follow-up with APP after cath  Signed, Sharlene Dory, PA-C

## 2023-10-14 ENCOUNTER — Ambulatory Visit: Payer: Medicare Other | Admitting: Physician Assistant

## 2023-10-14 DIAGNOSIS — I2510 Atherosclerotic heart disease of native coronary artery without angina pectoris: Secondary | ICD-10-CM

## 2023-10-14 DIAGNOSIS — I5042 Chronic combined systolic (congestive) and diastolic (congestive) heart failure: Secondary | ICD-10-CM

## 2023-10-14 DIAGNOSIS — Z7901 Long term (current) use of anticoagulants: Secondary | ICD-10-CM

## 2023-10-14 DIAGNOSIS — R0602 Shortness of breath: Secondary | ICD-10-CM

## 2023-10-14 DIAGNOSIS — I10 Essential (primary) hypertension: Secondary | ICD-10-CM

## 2023-10-14 DIAGNOSIS — E785 Hyperlipidemia, unspecified: Secondary | ICD-10-CM

## 2023-10-16 ENCOUNTER — Ambulatory Visit: Payer: Self-pay

## 2023-10-16 NOTE — Patient Outreach (Signed)
Care Coordination   10/16/2023 Name: Nathan Fernandez MRN: [id number] DOB: 1941-11-11   Care Coordination Outreach Attempts:  An unsuccessful telephone outreach was attempted for a scheduled appointment today.  Attempted call to listed mobile number. HIPAA compliant message left with return call phone number.   Attempted call to listed home number.  Unable to leave message due to mailbox being full.   Follow Up Plan:  Additional outreach attempts will be made to offer the patient care coordination information and services.   Encounter Outcome:  No Answer   Care Coordination Interventions:  No, not indicated    [facility] Care Coordination [phone] direct line

## 2023-10-20 NOTE — Telephone Encounter (Signed)
Contacted [employer] for status update. Patient has been initially denied due to eligibility for Medicare Extra Help Program. Attempted to update pt by phone, no answer. Left voicemail

## 2023-10-20 NOTE — Progress Notes (Signed)
Carelink Summary Report / Loop Recorder 

## 2023-10-31 ENCOUNTER — Emergency Department (HOSPITAL_BASED_OUTPATIENT_CLINIC_OR_DEPARTMENT_OTHER): Payer: Medicare Other

## 2023-10-31 ENCOUNTER — Emergency Department (HOSPITAL_BASED_OUTPATIENT_CLINIC_OR_DEPARTMENT_OTHER)
Admission: EM | Admit: 2023-10-31 | Discharge: 2023-10-31 | Disposition: A | Discharge status: Home / Self Care | Payer: Medicare Other | Attending: Emergency Medicine | Admitting: Emergency Medicine

## 2023-10-31 ENCOUNTER — Encounter (HOSPITAL_BASED_OUTPATIENT_CLINIC_OR_DEPARTMENT_OTHER): Payer: Self-pay | Admitting: Emergency Medicine

## 2023-10-31 DIAGNOSIS — I10 Essential (primary) hypertension: Secondary | ICD-10-CM | POA: Diagnosis not present

## 2023-10-31 DIAGNOSIS — I771 Stricture of artery: Secondary | ICD-10-CM | POA: Diagnosis not present

## 2023-10-31 DIAGNOSIS — M542 Cervicalgia: Secondary | ICD-10-CM | POA: Insufficient documentation

## 2023-10-31 DIAGNOSIS — M25512 Pain in left shoulder: Secondary | ICD-10-CM | POA: Diagnosis not present

## 2023-10-31 DIAGNOSIS — M19012 Primary osteoarthritis, left shoulder: Secondary | ICD-10-CM | POA: Diagnosis not present

## 2023-10-31 DIAGNOSIS — R0789 Other chest pain: Secondary | ICD-10-CM | POA: Diagnosis not present

## 2023-10-31 DIAGNOSIS — S4982XA Other specified injuries of left shoulder and upper arm, initial encounter: Secondary | ICD-10-CM | POA: Diagnosis not present

## 2023-10-31 HISTORY — DX: Cerebral infarction, unspecified: I63.9

## 2023-10-31 MED ORDER — ACETAMINOPHEN 500 MG PO TABS
1000.0000 mg | ORAL_TABLET | Freq: Once | ORAL | Status: AC
  Administered 2023-10-31: 1000 mg via ORAL
  Filled 2023-10-31: qty 2

## 2023-10-31 MED ORDER — OXYCODONE HCL 5 MG PO TABS
5.0000 mg | ORAL_TABLET | Freq: Once | ORAL | Status: AC
  Administered 2023-10-31: 5 mg via ORAL
  Filled 2023-10-31: qty 1

## 2023-10-31 MED ORDER — CYCLOBENZAPRINE HCL 10 MG PO TABS: 10.0000 mg | ORAL_TABLET | Freq: Two times a day (BID) | ORAL | 0 refills | Status: DC | PRN

## 2023-10-31 MED ORDER — LIDOCAINE 5 % EX PTCH
1.0000 | MEDICATED_PATCH | CUTANEOUS | Status: DC
  Administered 2023-10-31: 1 via TRANSDERMAL
  Filled 2023-10-31: qty 1

## 2023-10-31 MED ORDER — OXYCODONE-ACETAMINOPHEN 5-325 MG PO TABS
1.0000 | ORAL_TABLET | Freq: Four times a day (QID) | ORAL | 0 refills | Status: DC | PRN
Start: 2023-10-31 — End: 2023-11-13

## 2023-10-31 MED ORDER — CYCLOBENZAPRINE HCL 5 MG PO TABS
5.0000 mg | ORAL_TABLET | Freq: Once | ORAL | Status: AC
  Administered 2023-10-31: 5 mg via ORAL
  Filled 2023-10-31: qty 1

## 2023-10-31 MED ORDER — LIDOCAINE 5 % EX OINT: 1.0000 | TOPICAL_OINTMENT | CUTANEOUS | 0 refills | Status: DC | PRN

## 2023-10-31 NOTE — ED Triage Notes (Addendum)
Pt reports LT shoulder pain radiating to LT chest and LUE for a few weeks; no injury; difficulty lifting arm d/t pain

## 2023-10-31 NOTE — ED Provider Notes (Signed)
[place] [facility] AT [facility] Provider Note   CSN: [id number] Arrival date & time: 10/31/23  1627     History Chief Complaint  Patient presents with   Shoulder Pain    HPI Nathan Fernandez is a 82 y.o. male presenting for left-sided shoulder pain radiating into his left neck and left anterior chest.  It is very tender to touch has been worsening slowly over the last 6 months with acute worsening over the last 2 weeks.  He started getting numbness in his left hand that was intermittent in nature.  He denies fevers chills nausea vomiting syncope shortness of breath.  Follows with outpatient neurosurgery for similar symptoms secondary to lumbar radiculopathy though the cervical symptoms are new. Patient is very clear that the main reason he came in tonight is treatment of his pain.   Patient's recorded medical, surgical, social, medication list and allergies were reviewed in the Snapshot window as part of the initial history.   Review of Systems   Review of Systems  Constitutional:  Negative for chills and fever.  HENT:  Negative for ear pain and sore throat.   Eyes:  Negative for pain and visual disturbance.  Respiratory:  Negative for cough and shortness of breath.   Cardiovascular:  Negative for chest pain and palpitations.  Gastrointestinal:  Negative for abdominal pain and vomiting.  Genitourinary:  Negative for dysuria and hematuria.  Musculoskeletal:  Negative for arthralgias and back pain.  Skin:  Negative for color change and rash.  Neurological:  Positive for numbness. Negative for seizures and syncope.  All other systems reviewed and are negative.   Physical Exam Updated Vital Signs BP (!) 174/111   Pulse 90   Temp 97.6 F (36.4 C) (Oral)   Resp 18   Ht 5\' 9"  (1.753 m)   Wt 69.9 kg   SpO2 95%   BMI 22.74 kg/m  Physical Exam Vitals and nursing note reviewed.  Constitutional:      General: He is not in acute distress.    Appearance:  He is well-developed.  HENT:     Head: Normocephalic and atraumatic.  Eyes:     Conjunctiva/sclera: Conjunctivae normal.  Cardiovascular:     Rate and Rhythm: Normal rate and regular rhythm.  Pulmonary:     Effort: Pulmonary effort is normal. No respiratory distress.  Abdominal:     General: Abdomen is flat. There is no distension.  Musculoskeletal:        General: No swelling or deformity.  Skin:    General: Skin is warm and dry.     Capillary Refill: Capillary refill takes less than 2 seconds.  Neurological:     Mental Status: He is alert and oriented to person, place, and time. Mental status is at baseline.     Comments: Some numbness along his left hand radial dermatome.  Strength is currently intact.      ED Course/ Medical Decision Making/ A&P    Procedures Procedures   Medications Ordered in ED Medications  lidocaine (LIDODERM) 5 % 1 patch (has no administration in time range)  cyclobenzaprine (FLEXERIL) tablet 5 mg (has no administration in time range)  acetaminophen (TYLENOL) tablet 1,000 mg (1,000 mg Oral Given 10/31/23 2118)  oxyCODONE (Oxy IR/ROXICODONE) immediate release tablet 5 mg (5 mg Oral Given 10/31/23 2118)    Medical Decision Making:   82 year old male with multifactorial pain.  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 However this is a new acute issue. Concerning his left shoulder and arm pain, localized musculoskeletal injury would be in the differential x-rays performed to rule out fracture and was grossly reassuring. Concerning his left anterior chest pain: They state they have already seen a cardiologist about this and have been told it is noncardiac in nature repeated EKG and chest x-ray without any focal pathology on either the studies. The focal point of his presentation tonight seems to be a new cervical radiculopathy with radiation down his left arm and the radial dermatome. Treated with ice and  compression, lidocaine patch locally, p.o. pain control with acetaminophen and oxycodone as well as muscle relaxers. Had a long conversation with patient about the risk of these medications at his age, they are unlikely provide long-term resolution and may result in risk of falls.  They expressed understanding but states that the pain is overwhelming and that he is completely unable to function and is willing to try anything at this time.  Given his understanding of risk of fall we will trial these therapies in the outpatient setting.  Wife will drive and assist him around the house over the next couple days.  He already follows with neurosurgery in the outpatient setting they will call to make an appointment for further workup of cervical radiculopathy.  Disposition:  I have considered need for hospitalization, however, considering all of the above, I believe this patient is stable for discharge at this time.  Patient/family educated about specific return precautions for given chief complaint and symptoms.  Patient/family educated about follow-up with PCP and neurosurgery.     Patient/family expressed understanding of return precautions and need for follow-up. Patient spoken to regarding all imaging and laboratory results and appropriate follow up for these results. All education provided in verbal form with additional information in written form. Time was allowed for answering of patient questions. Patient discharged.    Emergency Department Medication Summary:   Medications  lidocaine (LIDODERM) 5 % 1 patch (has no administration in time range)  cyclobenzaprine (FLEXERIL) tablet 5 mg (has no administration in time range)  acetaminophen (TYLENOL) tablet 1,000 mg (1,000 mg Oral Given 10/31/23 2118)  oxyCODONE (Oxy IR/ROXICODONE) immediate release tablet 5 mg (5 mg Oral Given 10/31/23 2118)        Clinical Impression:  1. Neck pain      Discharge   Final Clinical Impression(s) / ED  Diagnoses Final diagnoses:  Neck pain    Rx / DC Orders ED Discharge Orders          Ordered    cyclobenzaprine (FLEXERIL) 10 MG tablet  2 times daily PRN        10/31/23 2135    lidocaine (XYLOCAINE) 5 % ointment  As needed        10/31/23 2135    oxyCODONE-acetaminophen (PERCOCET/ROXICET) 5-325 MG tablet  Every 6 hours PRN        10/31/23 2135              Glyn Ade, MD 10/31/23 2324

## 2023-11-06 ENCOUNTER — Other Ambulatory Visit: Payer: Self-pay | Admitting: Family Medicine

## 2023-11-06 NOTE — Telephone Encounter (Signed)
Last office visit: 08/03/23  Next office visit: NOTHING SCHEDULED Last refill:   07/22/23 ROSUVASTATIN CALCIUM 20 MG TAB QTY 90 TABLETS 0 REFILLS  04/16/23 CARVEDILOL 6.25 MG TABLET 180 TABLETS 2 REFILLS

## 2023-11-08 NOTE — Telephone Encounter (Signed)
Sent.  Please schedule DM2 f/u. We can do labs at the visit.  Thanks.

## 2023-11-09 ENCOUNTER — Ambulatory Visit (INDEPENDENT_AMBULATORY_CARE_PROVIDER_SITE_OTHER): Payer: Medicare Other

## 2023-11-09 DIAGNOSIS — I639 Cerebral infarction, unspecified: Secondary | ICD-10-CM | POA: Diagnosis not present

## 2023-11-09 LAB — CUP PACEART REMOTE DEVICE CHECK
Date Time Interrogation Session: 20241117230621
Implantable Pulse Generator Implant Date: 20210525

## 2023-11-10 ENCOUNTER — Other Ambulatory Visit: Payer: Self-pay | Admitting: Family Medicine

## 2023-11-10 DIAGNOSIS — M5412 Radiculopathy, cervical region: Secondary | ICD-10-CM | POA: Diagnosis not present

## 2023-11-10 NOTE — Telephone Encounter (Signed)
Patient has been scheduled

## 2023-11-13 ENCOUNTER — Encounter: Payer: Self-pay | Admitting: Family Medicine

## 2023-11-13 ENCOUNTER — Ambulatory Visit (INDEPENDENT_AMBULATORY_CARE_PROVIDER_SITE_OTHER): Payer: Medicare Other | Admitting: Family Medicine

## 2023-11-13 VITALS — BP 142/78 | HR 87 | Temp 97.8°F | Ht 69.0 in | Wt 160.0 lb

## 2023-11-13 DIAGNOSIS — E785 Hyperlipidemia, unspecified: Secondary | ICD-10-CM | POA: Diagnosis not present

## 2023-11-13 DIAGNOSIS — E1169 Type 2 diabetes mellitus with other specified complication: Secondary | ICD-10-CM | POA: Diagnosis not present

## 2023-11-13 DIAGNOSIS — M542 Cervicalgia: Secondary | ICD-10-CM | POA: Diagnosis not present

## 2023-11-13 DIAGNOSIS — E119 Type 2 diabetes mellitus without complications: Secondary | ICD-10-CM

## 2023-11-13 LAB — TSH: TSH: 1.46 u[IU]/mL (ref 0.35–5.50)

## 2023-11-13 LAB — COMPREHENSIVE METABOLIC PANEL
ALT: 9 U/L (ref 0–53)
AST: 15 U/L (ref 0–37)
Albumin: 3.1 g/dL — ABNORMAL LOW (ref 3.5–5.2)
Alkaline Phosphatase: 56 U/L (ref 39–117)
BUN: 26 mg/dL — ABNORMAL HIGH (ref 6–23)
CO2: 31 meq/L (ref 19–32)
Calcium: 8.9 mg/dL (ref 8.4–10.5)
Chloride: 102 meq/L (ref 96–112)
Creatinine, Ser: 1.2 mg/dL (ref 0.40–1.50)
GFR: 56.06 mL/min — ABNORMAL LOW (ref >60.00)
Glucose, Bld: 154 mg/dL — ABNORMAL HIGH (ref 70–99)
Potassium: 4.1 meq/L (ref 3.5–5.1)
Sodium: 139 meq/L (ref 135–145)
Total Bilirubin: 0.5 mg/dL (ref 0.2–1.2)
Total Protein: 5.3 g/dL — ABNORMAL LOW (ref 6.0–8.3)

## 2023-11-13 LAB — CBC WITH DIFFERENTIAL/PLATELET
Basophils Absolute: 0.1 10*3/uL (ref 0.0–0.1)
Basophils Relative: 1.5 % (ref 0.0–3.0)
Eosinophils Absolute: 0.1 10*3/uL (ref 0.0–0.7)
Eosinophils Relative: 1.4 % (ref 0.0–5.0)
HCT: 38.1 % — ABNORMAL LOW (ref 39.0–52.0)
Hemoglobin: 12.5 g/dL — ABNORMAL LOW (ref 13.0–17.0)
Lymphocytes Relative: 15.4 % (ref 12.0–46.0)
Lymphs Abs: 1.3 10*3/uL (ref 0.7–4.0)
MCHC: 33 g/dL (ref 30.0–36.0)
MCV: 93.3 fL (ref 78.0–100.0)
Monocytes Absolute: 0.7 10*3/uL (ref 0.1–1.0)
Monocytes Relative: 8.2 % (ref 3.0–12.0)
Neutro Abs: 6.4 10*3/uL (ref 1.4–7.7)
Neutrophils Relative %: 73.5 % (ref 43.0–77.0)
Platelets: 190 10*3/uL (ref 150.0–400.0)
RBC: 4.08 Mil/uL — ABNORMAL LOW (ref 4.22–5.81)
RDW: 14.3 % (ref 11.5–15.5)
WBC: 8.7 10*3/uL (ref 4.0–10.5)

## 2023-11-13 LAB — HEMOGLOBIN A1C: Hgb A1c MFr Bld: 6.8 % — ABNORMAL HIGH (ref 4.6–6.5)

## 2023-11-13 LAB — VITAMIN B12: Vitamin B-12: 748 pg/mL (ref 211–911)

## 2023-11-13 MED ORDER — BUPROPION HCL ER (SR) 150 MG PO TB12: 150.0000 mg | ORAL_TABLET | Freq: Every day | ORAL | Status: DC

## 2023-11-13 MED ORDER — OXYCODONE-ACETAMINOPHEN 5-325 MG PO TABS: 0.5000 | ORAL_TABLET | Freq: Four times a day (QID) | ORAL | 0 refills | Status: DC | PRN

## 2023-11-13 MED ORDER — TRAZODONE HCL 100 MG PO TABS: 100.0000 mg | ORAL_TABLET | Freq: Every day | ORAL | Status: DC

## 2023-11-13 MED ORDER — GABAPENTIN 300 MG PO CAPS: 300.0000 mg | ORAL_CAPSULE | Freq: Three times a day (TID) | ORAL | Status: DC

## 2023-11-13 NOTE — Patient Instructions (Addendum)
I would try stopping flexeril and continuing gabapentin. Go to the lab on the way out.   If you have mychart we'll likely use that to update you.    Take care.  Glad to see you. I'll await the plan from neurosurgery.

## 2023-11-13 NOTE — Progress Notes (Unsigned)
Fall cautions d/w pt. Fell after going to the bathroom.  He hadn't eaten much at the time.  Cautions d/w pt about flexeril.    ER eval for neck pain.  Had used oxycodone for pain, could tolerate 5mg  dose.    Pain starts at L side of the neck and goes down to shoulder area. Feels like he has loss strength in his left arm but movement still intact.  Has L upper arm pain with altered sensation in the L hand.  R arm and hand at baseline w/o symptoms.  On gabapentin 300mg  TID per outside clinic.  MRI pending.   He can cut back to 100mg  trazodone.  Still slept well with lower dose.    Weight loss with dec in appetite, over the last few months.  This could be pain related, d/w pt.     Weight 176 lbs in 04/2022.   Weight 160 lbs today.    No blood in stool.  No blood in urine.  No clear cause seen on labs.  CXR w/o mass.  Prev colonoscopy w/o lesion.  No diarrhea.  Some occ clear/white sputum.    Due for A1c. Prev labs d/w pt.  Off jardiance currently.  He had trouble paying for med.      Meds, vitals, and allergies reviewed.   ROS: Per HPI unless specifically indicated in ROS section   Altered sensation L forearm.

## 2023-11-15 ENCOUNTER — Other Ambulatory Visit: Payer: Self-pay | Admitting: Family Medicine

## 2023-11-15 DIAGNOSIS — M542 Cervicalgia: Secondary | ICD-10-CM | POA: Insufficient documentation

## 2023-11-15 NOTE — Assessment & Plan Note (Addendum)
With h/o weight loss noted.  Due for A1c. Prev labs d/w pt.  Off jardiance currently.  He had trouble paying for med.  See notes on follow-up labs.  I asked him to update me if he has any more weight loss.

## 2023-11-15 NOTE — Assessment & Plan Note (Signed)
I would try stopping flexeril and continuing gabapentin. Fall cautions d/w pt.  I'll await the plan/MRI from neurosurgery.

## 2023-11-17 ENCOUNTER — Other Ambulatory Visit: Payer: Self-pay | Admitting: Student

## 2023-11-17 DIAGNOSIS — M5412 Radiculopathy, cervical region: Secondary | ICD-10-CM

## 2023-11-21 ENCOUNTER — Ambulatory Visit
Admission: RE | Admit: 2023-11-21 | Discharge: 2023-11-21 | Disposition: A | Discharge status: Home / Self Care | Payer: Medicare Other | Source: Ambulatory Visit | Attending: Student | Admitting: Student

## 2023-11-21 DIAGNOSIS — M4722 Other spondylosis with radiculopathy, cervical region: Secondary | ICD-10-CM | POA: Diagnosis not present

## 2023-11-21 DIAGNOSIS — M4802 Spinal stenosis, cervical region: Secondary | ICD-10-CM | POA: Diagnosis not present

## 2023-11-21 DIAGNOSIS — M9971 Connective tissue and disc stenosis of intervertebral foramina of cervical region: Secondary | ICD-10-CM | POA: Diagnosis not present

## 2023-11-21 DIAGNOSIS — M5412 Radiculopathy, cervical region: Secondary | ICD-10-CM

## 2023-11-21 DIAGNOSIS — M50223 Other cervical disc displacement at C6-C7 level: Secondary | ICD-10-CM | POA: Diagnosis not present

## 2023-11-22 ENCOUNTER — Other Ambulatory Visit: Payer: Self-pay | Admitting: Family Medicine

## 2023-11-22 MED ORDER — CYCLOBENZAPRINE HCL 5 MG PO TABS: 5.0000 mg | ORAL_TABLET | Freq: Three times a day (TID) | ORAL | Status: DC | PRN

## 2023-11-24 ENCOUNTER — Telehealth: Payer: Self-pay | Admitting: Family Medicine

## 2023-11-24 ENCOUNTER — Ambulatory Visit (INDEPENDENT_AMBULATORY_CARE_PROVIDER_SITE_OTHER): Payer: Medicare Other

## 2023-11-24 VITALS — Ht 69.0 in | Wt 156.0 lb

## 2023-11-24 DIAGNOSIS — Z0000 Encounter for general adult medical examination without abnormal findings: Secondary | ICD-10-CM | POA: Diagnosis not present

## 2023-11-24 NOTE — Telephone Encounter (Signed)
Called patient spoke to wife patient is sleeping. Patient is taking flexeril 5 mg tid . Gabapentin 300mg  bid (in morning and at night). When he takes in the morning he does not take together. It is hard for her to tell me what times. States patient takes in between napping. He has had increased naps and some changes in speech after starting this medications. Wife started reducing after last visit and has noticed improvement in symptoms. Patient has not had Oxycodone in over a week. Wife states that it was bad on 11/23. After making the adjustments he is doing much better. States at times he sill is having issues with "hallucination". Wife states that he it as not been near as much it was before last office visit.  Dr. Wynetta Emery can't seen until 12/27/22.

## 2023-11-24 NOTE — Telephone Encounter (Signed)
Please triage patient about speech and mentation changes.  See [facility] visit note from today.  Thanks.

## 2023-11-24 NOTE — Patient Instructions (Signed)
Nathan Fernandez , Thank you for taking time to come for your Medicare Wellness Visit. I appreciate your ongoing commitment to your health goals. Please review the following plan we discussed and let me know if I can assist you in the future.   Referrals/Orders/Follow-Ups/Clinician Recommendations: none  This is a list of the screening recommended for you and due dates:  Health Maintenance  Topic Date Due   Zoster (Shingles) Vaccine (1 of 2) Never done   DTaP/Tdap/Td vaccine (2 - Tdap) 09/02/2011   Eye exam for diabetics  08/17/2021   Complete foot exam   09/07/2021   Flu Shot  03/21/2024*   Yearly kidney health urinalysis for diabetes  03/08/2024   Hemoglobin A1C  05/12/2024   Yearly kidney function blood test for diabetes  11/12/2024   Medicare Annual Wellness Visit  11/23/2024   Pneumonia Vaccine  Completed   HPV Vaccine  Aged Out   COVID-19 Vaccine  Discontinued  *Topic was postponed. The date shown is not the original due date.    Advanced directives: (Declined) Advance directive discussed with you today. Even though you declined this today, please call our office should you change your mind, and we can give you the proper paperwork for you to fill out.  Next Medicare Annual Wellness Visit scheduled for next year: Yes 11/24/2024 @ 8:50am telephone

## 2023-11-24 NOTE — Telephone Encounter (Signed)
If he has acute changes, needs ER eval.  I would stop flexeril in the meantime.  Please see about getting rechecked here in OV, assuming no acute changes in the meantime.  Thanks.

## 2023-11-24 NOTE — Progress Notes (Signed)
Subjective:   Nathan Fernandez is a 82 y.o. male who presents for Medicare Annual/Subsequent preventive examination.  Visit Complete: Virtual I connected with  [name] on 11/24/23 by a audio enabled telemedicine application and verified that I am speaking with the correct person using two identifiers.  Patient Location: Home  Provider Location: Office/Clinic  I discussed the limitations of evaluation and management by telemedicine. The patient expressed understanding and agreed to proceed.  Vital Signs: Because this visit was a virtual/telehealth visit, some criteria may be missing or patient reported. Any vitals not documented were not able to be obtained and vitals that have been documented are patient reported.  Patient Medicare AWV questionnaire was completed by the patient on (not done); I have confirmed that all information answered by patient is correct and no changes since this date. Cardiac Risk Factors include: advanced age (>57men, >13 women);diabetes mellitus;dyslipidemia;hypertension;male gender;sedentary lifestyle    Objective:    Today's Vitals   11/24/23 0851 11/24/23 0852  Weight: 156 lb (70.8 kg)   Height: 5\' 9"  (1.753 m)   PainSc:  5    Body mass index is 23.04 kg/m.     11/24/2023    9:09 AM 10/31/2023    4:38 PM 09/14/2023   11:33 AM 08/07/2022    1:03 PM 07/23/2022    2:33 PM 01/04/2022    1:22 PM 06/19/2021    6:00 PM  Advanced Directives  Does Patient Have a Medical Advance Directive? No No No No No No Yes  Type of Tax inspector;Living will  Does patient want to make changes to medical advance directive?       No - Patient declined  Would patient like information on creating a medical advance directive?   No - Patient declined No - Patient declined No - Patient declined      Current Medications (verified) Outpatient Encounter Medications as of 11/24/2023  Medication Sig   albuterol (VENTOLIN HFA) 108 (90 Base)  MCG/ACT inhaler Inhale 1-2 puffs into the lungs every 6 (six) hours as needed (COPD).   Ascorbic Acid (VITAMIN C PO) Take 1 tablet by mouth daily.   B Complex CAPS Take 1 capsule by mouth daily.   buPROPion (WELLBUTRIN SR) 150 MG 12 hr tablet Take 1 tablet (150 mg total) by mouth daily.   carvedilol (COREG) 6.25 MG tablet TAKE 1 TABLET BY MOUTH TWICE A DAY   Cholecalciferol (VITAMIN D-3 PO) Take 1 capsule by mouth daily.   cyclobenzaprine (FLEXERIL) 5 MG tablet Take 1 tablet (5 mg total) by mouth 3 (three) times daily as needed for muscle spasms.   dorzolamide-timolol (COSOPT) 22.3-6.8 MG/ML ophthalmic solution Place 1 drop into both eyes 2 (two) times daily.   ELDERBERRY PO Take 1 capsule by mouth in the morning and at bedtime.   fenofibrate (TRICOR) 48 MG tablet TAKE 1 TABLET BY MOUTH EVERY DAY   furosemide (LASIX) 20 MG tablet Take 20 mg by mouth as needed for fluid or edema.   gabapentin (NEURONTIN) 300 MG capsule Take 1 capsule (300 mg total) by mouth 3 (three) times daily.   hydrALAZINE (APRESOLINE) 10 MG tablet TAKE 1 TABLET BY MOUTH EVERY 12 HOURS.   isosorbide mononitrate (IMDUR) 30 MG 24 hr tablet TAKE 1/2 TABLET BY MOUTH DAILY   ketoconazole (NIZORAL) 2 % cream APPLY 1 APPLICATION TOPICALLY DAILY AS NEEDED FOR IRRITATION.   lidocaine (XYLOCAINE) 5 % ointment Apply 1 Application  topically as needed.   nitroGLYCERIN (NITROSTAT) 0.4 MG SL tablet Place 1 tablet (0.4 mg total) under the tongue every 5 (five) minutes as needed for chest pain. Up to 3 doses   oxyCODONE-acetaminophen (PERCOCET/ROXICET) 5-325 MG tablet Take 0.5-1 tablets by mouth every 6 (six) hours as needed for severe pain (pain score 7-10) (sedation caution.).   polyethylene glycol powder (GLYCOLAX/MIRALAX) 17 GM/SCOOP powder Take 17 g by mouth daily as needed.   rosuvastatin (CRESTOR) 20 MG tablet TAKE 1 TABLET BY MOUTH EVERY DAY   spironolactone (ALDACTONE) 25 MG tablet Take 0.5 tablets (12.5 mg total) by mouth daily.    traZODone (DESYREL) 100 MG tablet Take 1-2 tablets (100-200 mg total) by mouth at bedtime.   XARELTO 20 MG TABS tablet TAKE 1 TABLET BY MOUTH DAILY WITH SUPPER.   No facility-administered encounter medications on file as of 11/24/2023.    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   Hosp R facial weakness   CAD (coronary artery disease)    MI, Hosp 1993, PTCA; LHC 04/18/13: Distal left main 40%, proximal LAD with several aneurysmal segments, proximal LAD 50% prior to and after aneurysmal segments, area does not appear to flow-limiting, proximal diagonal 70%, ostial circumflex 20%, proximal circumflex 20%, OM1 30-40%, proximal RCA 40%, mid RCA 30%, distal RCA 20%, EF 50% => med Rx   Cancer (HCC)    skin cancer - 2020   COPD (chronic obstructive pulmonary disease) (HCC)    DM2 (diabetes mellitus, type 2) (HCC)    DVT (deep venous thrombosis) (HCC) 12/2021   History of ETT 1998   wnl   History of hiatal hernia    History of MRI 08/04/2009   brain- atrophy sm vess dz   HLD (hyperlipidemia)    HTN (hypertension)    Myocardial infarction (HCC)    OA (osteoarthritis)    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HCC)    05/09/12 [place]: 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, large collateral proximal to aneurysm   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   Stroke [facility])    Past Surgical History:  Procedure Laterality Date   BUBBLE STUDY  05/15/2020   Procedure: BUBBLE STUDY;  Surgeon: Chrystie Nose, MD;  Location: [facility] ENDOSCOPY;  Service: Cardiovascular;;   CARDIAC CATHETERIZATION     cataract surgery  08/2005   repair lens which moved   EYE SURGERY     LEFT HEART CATH AND CORONARY ANGIOGRAPHY N/A 01/21/2019   Procedure: LEFT HEART CATH AND CORONARY ANGIOGRAPHY;  Surgeon: Kathleene Hazel, MD;  Location: [facility] INVASIVE CV LAB;  Service: Cardiovascular;  Laterality: N/A;   LEFT HEART CATH AND CORONARY ANGIOGRAPHY N/A 02/13/2021   Procedure: LEFT HEART CATH AND CORONARY ANGIOGRAPHY;  Surgeon: Iran Ouch, MD;  Location: [facility] INVASIVE CV LAB;  Service: Cardiovascular;  Laterality: N/A;   LOOP RECORDER INSERTION N/A 05/15/2020   Procedure: LOOP RECORDER INSERTION;  Surgeon: Hillis Range, MD;  Location: [facility] INVASIVE CV LAB;  Service: Cardiovascular;  Laterality: N/A;   LUMBAR LAMINECTOMY/DECOMPRESSION MICRODISCECTOMY N/A 06/19/2021   Procedure: Lumbar Two-Three, Lumbar Three-Four, Lumbar Four-Five Laminectomy and Foraminotomy;  Surgeon: Donalee Citrin, MD;  Location: [facility] OR;  Service: Neurosurgery;  Laterality: N/A;   LUMBAR LAMINECTOMY/DECOMPRESSION MICRODISCECTOMY Bilateral 07/28/2022   Procedure: Sublaminar decompression - Lumbar two-Lumbar three - bilateral redo;  Surgeon: Donalee Citrin, MD;  Location: [facility] OR;  Service: Neurosurgery;  Laterality: Bilateral;  3C   RIGHT/LEFT HEART CATH AND CORONARY ANGIOGRAPHY N/A 09/29/2023   Procedure: RIGHT/LEFT HEART CATH AND CORONARY ANGIOGRAPHY;  Surgeon: Elder Negus, MD;  Location: [facility] INVASIVE CV LAB;  Service: Cardiovascular;  Laterality: N/A;   stress myoview  06/29/2006   sm distal anteroseptal & apical infarct   TEE WITHOUT CARDIOVERSION N/A 05/15/2020   Procedure: TRANSESOPHAGEAL ECHOCARDIOGRAM (TEE);  Surgeon: Chrystie Nose, MD;  Location: [facility] ENDOSCOPY;  Service: Cardiovascular;  Laterality: N/A;   THYROIDECTOMY, PARTIAL  1967   B9 growth   TONSILLECTOMY     Family History  Problem Relation Age of Onset   Hypertension Mother    Aneurysm Mother    Stroke Mother    Hypertension Father    Heart disease Father        CAD   Heart disease Son        MI   Colon cancer Neg Hx    Prostate cancer Neg Hx    Social History   Socioeconomic History   Marital status: Married    Spouse name: Steward Drone   Number of children: 4    Years of education: Not on file   Highest education level: High school graduate  Occupational History   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[employer] [employer]    Comment: for [employer] with [employer]   Occupation: retired  Tobacco Use   Smoking status: Former    Types: Cigars   Smokeless tobacco: Never   Tobacco comments:    cigar occassionally  Vaping Use   Vaping status: Never Used  Substance and Sexual Activity   Alcohol use: Yes    Comment: occasional   Drug use: No   Sexual activity: Not on file  Other Topics Concern   Not on file  Social History Narrative   Divorced, lives with partner Steward Drone)- married 09/2015   3 living children, had a daughter who died at age 24 in November 12, 2023   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, retired as of 2019   Social Determinants of Health   Financial Resource Strain: Low Risk  (11/24/2023)   Overall Financial Resource Strain (CARDIA)    Difficulty of Paying Living Expenses: Not very hard  Food Insecurity: No Food Insecurity (11/24/2023)   Hunger Vital Sign    Worried About Running Out of Food in the Last Year: Never true    Ran Out of Food in the Last Year: Never true  Transportation Needs: No Transportation Needs (11/24/2023)   PRAPARE - Administrator, Civil Service (Medical): No    Lack of Transportation (Non-Medical): No  Physical Activity: Inactive (11/24/2023)   Exercise Vital Sign    Days of Exercise per Week: 0 days    Minutes of Exercise per Session: 0 min  Stress: Stress Concern Present (11/24/2023)   [employer] of Occupational Health - Occupational Stress Questionnaire    Feeling of Stress : To some extent  Social Connections: Socially Integrated (11/24/2023)   Social Connection and Isolation Panel [NHANES]    Frequency of Communication with Friends and Family: More than three times a week    Frequency of Social Gatherings with Friends and Family: More than three times a week    Attends Religious  Services: More than 4 times per year    Active Member of [employer] or Organizations: Yes    Attends Banker Meetings: More than 4 times per  year    Marital Status: Married    Tobacco Counseling Counseling given: Not Answered Tobacco comments: cigar occassionally   Clinical Intake:  Pre-visit preparation completed: Yes  Pain : 0-10 Pain Score: 5  Pain Type: Chronic pain Pain Location: Shoulder (left side of neck) Pain Orientation: Left Pain Descriptors / Indicators: Aching, Spasm Pain Onset: More than a month ago Pain Frequency: Constant Pain Relieving Factors: medication,heat  Pain Relieving Factors: medication,heat  BMI - recorded: 23.04 Nutritional Status: BMI of 19-24  Normal Nutritional Risks: None Diabetes: Yes CBG done?: No Did pt. bring in CBG monitor from home?: No  How often do you need to have someone help you when you read instructions, pamphlets, or other written materials from your doctor or pharmacy?: 1 - Never  Interpreter Needed?: No  Comments: lives with wife Information entered by :: B.Dolton Shaker,LPN   Activities of Daily Living    11/24/2023    9:09 AM 09/14/2023   10:30 PM  In your present state of health, do you have any difficulty performing the following activities:  Hearing? 1 1  Vision? 0 0  Difficulty concentrating or making decisions? 1 0  Walking or climbing stairs? 0 1  Dressing or bathing? 0 0  Doing errands, shopping? 0 1  Preparing Food and eating ? N   Using the Toilet? N   In the past six months, have you accidently leaked urine? N   Do you have problems with loss of bowel control? N   Managing your Medications? Y   Managing your Finances? N   Housekeeping or managing your Housekeeping? Y     Patient Care Team: Joaquim Nam, MD as PCP - General (Family Medicine) Wendall Stade, MD as PCP - Cardiology (Cardiology) Wendall Stade, MD as Consulting Physician (Cardiology) Kathyrn Sheriff, [facility] (Inactive)  as Pharmacist (Pharmacist) Otho Ket, RN as [facility] Care Management Maris Berger, MD as Consulting Physician (Ophthalmology)  Indicate any recent Medical Services you may have received from other than [facility] providers in the past year (date may be approximate).     Assessment:   This is a routine wellness examination for Nathan Fernandez.  Hearing/Vision screen Hearing Screening - Comments:: Pt says his hearing is not as good:needs hearing aids soon Vision Screening - Comments:: Pt says his vision is pretty good-readers only Dr Sherilyn Dacosta   Goals Addressed             This Visit's Progress    COMPLETED: Eat more fruits and vegetables   On track    Starting 08/24/2018, I will continue to decrease intake of starchy foods and increase intake of fresh fruits and vegetables to 5 servings day.     COMPLETED: Patient Stated   On track    08/31/2020, I will maintain and continue medications as prescribed.        Depression Screen    11/24/2023    9:06 AM 08/03/2023   11:03 AM 08/07/2022    1:11 PM 05/01/2022    3:43 PM 08/31/2020   11:28 AM 08/30/2019    9:56 AM 08/24/2018   10:01 AM  PHQ 2/9 Scores  PHQ - 2 Score 1 0 0 0 0 0 0  PHQ- 9 Score  0   0 0 0    Fall Risk    11/24/2023    9:00 AM 11/13/2023   11:01 AM 08/03/2023   11:02 AM 08/07/2022    1:04 PM 05/01/2022  3:43 PM  Fall Risk   Falls in the past year? 1 1 0 0 0  Number falls in past yr: 1 1 0 0 0  Injury with Fall? 0 0 0 0 0  Risk for fall due to : Impaired balance/gait;Impaired mobility History of fall(s) No Fall Risks  No Fall Risks  Follow up Education provided;Falls prevention discussed Falls evaluation completed Falls evaluation completed Falls evaluation completed;Education provided;Falls prevention discussed Falls evaluation completed    MEDICARE RISK AT HOME: Medicare Risk at Home Any stairs in or around the home?: No If so, are there any without handrails?: No Home free of loose throw  rugs in walkways, pet beds, electrical cords, etc?: Yes Adequate lighting in your home to reduce risk of falls?: Yes Life alert?: No (phone with him at all times) Use of a cane, walker or w/c?: Yes (uses cane, has walker) Grab bars in the bathroom?: Yes Shower chair or bench in shower?: Yes Elevated toilet seat or a handicapped toilet?: No  TIMED UP AND GO:  Was the test performed?  No    Cognitive Function:    08/31/2020   11:30 AM 08/30/2019   10:00 AM 08/24/2018    3:31 PM 02/16/2017   11:23 AM 02/08/2016   11:21 AM  MMSE - Mini Mental State Exam  Orientation to time 5 5 5 5 5   Orientation to Place 5 5 5 5 5   Registration 3 3 3 3 3   Attention/ Calculation 5 5 0 0 5  Recall 3 3 3 1 3   Recall-comments    pt was unable to recall 2 of 3 words   Language- name 2 objects  0 0 0   Language- repeat 1 1 1 1 1   Language- follow 3 step command  0 3 3 3   Language- read & follow direction  0 0 0 1  Write a sentence  0 0 0   Copy design  0 0 0   Total score  22 20 18          11/24/2023    9:11 AM 08/07/2022    1:06 PM  6CIT Screen  What Year? 0 points 0 points  What month? 0 points 0 points  What time? 0 points 0 points  Count back from 20 0 points 0 points  Months in reverse 0 points 0 points  Repeat phrase 0 points 0 points  Total Score 0 points 0 points    Immunizations Immunization History  Administered Date(s) Administered   Influenza Split 01/07/2012   Influenza,inj,Quad PF,6+ Mos 02/08/2016   PFIZER(Purple Top)SARS-COV-2 Vaccination 02/13/2020, 03/05/2020   Pneumococcal Conjugate-13 02/08/2016   Pneumococcal Polysaccharide-23 09/07/2020   Td 09/01/2001    TDAP status: Up to date  Flu Vaccine status: Due, Education has been provided regarding the importance of this vaccine. Advised may receive this vaccine at local pharmacy or Health Dept. Aware to provide a copy of the vaccination record if obtained from local pharmacy or Health Dept. Verbalized acceptance and  understanding. Pt does not take this  Pneumococcal vaccine status: Up to date  Covid-19 vaccine status: Completed vaccines  Qualifies for Shingles Vaccine? Yes   Zostavax completed No   Shingrix Completed?: No.    Education has been provided regarding the importance of this vaccine. Patient has been advised to call insurance company to determine out of pocket expense if they have not yet received this vaccine. Advised may also receive vaccine at local pharmacy or Health Dept. Verbalized  acceptance and understanding.  Screening Tests Health Maintenance  Topic Date Due   OPHTHALMOLOGY EXAM  11/24/2023 (Originally 08/17/2021)   FOOT EXAM  02/19/2024 (Originally 09/07/2021)   Zoster Vaccines- Shingrix (1 of 2) 02/22/2024 (Originally 09[birth year]/1990)   INFLUENZA VACCINE  03/21/2024 (Originally 07/23/2023)   DTaP/Tdap/Td (2 - Tdap) 11/23/2024 (Originally 09/02/2011)   Diabetic kidney evaluation - Urine ACR  03/08/2024   HEMOGLOBIN A1C  05/12/2024   Diabetic kidney evaluation - eGFR measurement  11/12/2024   Medicare Annual Wellness (AWV)  11/23/2024   Pneumonia Vaccine 31+ Years old  Completed   HPV VACCINES  Aged Out   COVID-19 Vaccine  Discontinued    Health Maintenance  There are no preventive care reminders to display for this patient.   Colorectal cancer screening: No longer required.   Lung Cancer Screening: (Low Dose CT Chest recommended if Age 43-80 years, 20 pack-year currently smoking OR have quit w/in 15years.) does not qualify.   Lung Cancer Screening Referral: no  Additional Screening:  Hepatitis C Screening: does not qualify; Completed no  Vision Screening: Recommended annual ophthalmology exams for early detection of glaucoma and other disorders of the eye. Is the patient up to date with their annual eye exam?  Yes  Who is the provider or what is the name of the office in which the patient attends annual eye exams? Dr.C McCuen If pt is not established with a provider, would  they like to be referred to a provider to establish care? No .   Dental Screening: Recommended annual dental exams for proper oral hygiene  Diabetic Foot Exam: Diabetic Foot Exam: Overdue, Pt has been advised about the importance in completing this exam. Pt is scheduled for diabetic foot exam on 02/18/23 w/PCP visit.  Community Resource Referral / Chronic Care Management: CRR required this visit?  no  CCM required this visit?  No py made aware of CCM need    Plan:     I have personally reviewed and noted the following in the patient's chart:   Medical and social history Use of alcohol, tobacco or illicit drugs  Current medications and supplements including opioid prescriptions. Patient is currently taking opioid prescriptions. Information provided to patient regarding non-opioid alternatives. Patient advised to discuss non-opioid treatment plan with their provider. Functional ability and status Nutritional status Physical activity Advanced directives List of other physicians Hospitalizations, surgeries, and ER visits in previous 12 months Vitals Screenings to include cognitive, depression, and falls Referrals and appointments  In addition, I have reviewed and discussed with patient certain preventive protocols, quality metrics, and best practice recommendations. A written personalized care plan for preventive services as well as general preventive health recommendations were provided to patient.    Sue Lush, LPN   [id number]   After Visit Summary: (MyChart) Due to this being a telephonic visit, the after visit summary with patients personalized plan was offered to patient via MyChart   Nurse Notes: pt relays he has had several falls this year and particularly 2 since Sunday. Pt denies any injuries. He relays that his legs buckle (just give out). Pt relays he has a walker and cane but does not use the walker. I encouraged pt to use walker more and carefully ambulate see if  it helps his balance and prevent falls.Pt on the phone speech sounding very slurred in interview. Wife with him answering some of questions pt did not know or could recall. Pt complains of managing left shoulder and neck pain. He relays  it is totally unmanageable sometimes. Pt encouraged to use the heat to help decrease pain (he relays it helps) to help manage pain.  *pt may benefit from CCM and assessment with frequent falls and possibly overmedicated.

## 2023-11-26 NOTE — Telephone Encounter (Signed)
Noted. Thanks.

## 2023-11-26 NOTE — Telephone Encounter (Signed)
Spoke with pt's wife, Steward Drone and she states that the pt is improved a little since her original conversation with JoEllen. She is aware of Dr. Lianne Bushy response and states that she will hold off on making an appointment with [place] for now. She wants to see how the pt does over the weekend. Steward Drone will keep [place] update on how he is doing. Nothing further was needed at this time.

## 2023-11-27 DIAGNOSIS — H524 Presbyopia: Secondary | ICD-10-CM | POA: Diagnosis not present

## 2023-11-27 DIAGNOSIS — Z961 Presence of intraocular lens: Secondary | ICD-10-CM | POA: Diagnosis not present

## 2023-11-27 DIAGNOSIS — H0100A Unspecified blepharitis right eye, upper and lower eyelids: Secondary | ICD-10-CM | POA: Diagnosis not present

## 2023-11-27 DIAGNOSIS — H4053X1 Glaucoma secondary to other eye disorders, bilateral, mild stage: Secondary | ICD-10-CM | POA: Diagnosis not present

## 2023-11-27 DIAGNOSIS — H0100B Unspecified blepharitis left eye, upper and lower eyelids: Secondary | ICD-10-CM | POA: Diagnosis not present

## 2023-11-30 ENCOUNTER — Emergency Department (HOSPITAL_BASED_OUTPATIENT_CLINIC_OR_DEPARTMENT_OTHER): Payer: Medicare Other

## 2023-11-30 ENCOUNTER — Inpatient Hospital Stay (HOSPITAL_BASED_OUTPATIENT_CLINIC_OR_DEPARTMENT_OTHER)
Admission: EM | Admit: 2023-11-30 | Discharge: 2023-12-11 | DRG: 871 | Disposition: A | Discharge status: Skilled Nursing Facility | Payer: Medicare Other | Attending: Internal Medicine | Admitting: Internal Medicine

## 2023-11-30 ENCOUNTER — Encounter (HOSPITAL_BASED_OUTPATIENT_CLINIC_OR_DEPARTMENT_OTHER): Payer: Self-pay | Admitting: Emergency Medicine

## 2023-11-30 ENCOUNTER — Other Ambulatory Visit: Payer: Self-pay

## 2023-11-30 DIAGNOSIS — R188 Other ascites: Secondary | ICD-10-CM | POA: Diagnosis present

## 2023-11-30 DIAGNOSIS — K5900 Constipation, unspecified: Secondary | ICD-10-CM | POA: Diagnosis not present

## 2023-11-30 DIAGNOSIS — Z87891 Personal history of nicotine dependence: Secondary | ICD-10-CM | POA: Diagnosis not present

## 2023-11-30 DIAGNOSIS — K5909 Other constipation: Secondary | ICD-10-CM | POA: Diagnosis present

## 2023-11-30 DIAGNOSIS — R64 Cachexia: Secondary | ICD-10-CM | POA: Diagnosis not present

## 2023-11-30 DIAGNOSIS — A419 Sepsis, unspecified organism: Secondary | ICD-10-CM | POA: Diagnosis not present

## 2023-11-30 DIAGNOSIS — D62 Acute posthemorrhagic anemia: Secondary | ICD-10-CM | POA: Diagnosis not present

## 2023-11-30 DIAGNOSIS — K922 Gastrointestinal hemorrhage, unspecified: Secondary | ICD-10-CM | POA: Diagnosis not present

## 2023-11-30 DIAGNOSIS — Z85828 Personal history of other malignant neoplasm of skin: Secondary | ICD-10-CM | POA: Diagnosis not present

## 2023-11-30 DIAGNOSIS — Z86718 Personal history of other venous thrombosis and embolism: Secondary | ICD-10-CM

## 2023-11-30 DIAGNOSIS — R6520 Severe sepsis without septic shock: Secondary | ICD-10-CM | POA: Diagnosis present

## 2023-11-30 DIAGNOSIS — Z6822 Body mass index (BMI) 22.0-22.9, adult: Secondary | ICD-10-CM

## 2023-11-30 DIAGNOSIS — R296 Repeated falls: Secondary | ICD-10-CM | POA: Diagnosis not present

## 2023-11-30 DIAGNOSIS — I5033 Acute on chronic diastolic (congestive) heart failure: Secondary | ICD-10-CM | POA: Diagnosis not present

## 2023-11-30 DIAGNOSIS — N179 Acute kidney failure, unspecified: Secondary | ICD-10-CM | POA: Diagnosis present

## 2023-11-30 DIAGNOSIS — J439 Emphysema, unspecified: Secondary | ICD-10-CM | POA: Diagnosis not present

## 2023-11-30 DIAGNOSIS — R310 Gross hematuria: Secondary | ICD-10-CM | POA: Diagnosis not present

## 2023-11-30 DIAGNOSIS — L89152 Pressure ulcer of sacral region, stage 2: Secondary | ICD-10-CM | POA: Diagnosis not present

## 2023-11-30 DIAGNOSIS — R918 Other nonspecific abnormal finding of lung field: Secondary | ICD-10-CM | POA: Diagnosis not present

## 2023-11-30 DIAGNOSIS — I2694 Multiple subsegmental pulmonary emboli without acute cor pulmonale: Secondary | ICD-10-CM | POA: Diagnosis not present

## 2023-11-30 DIAGNOSIS — R000 Tachycardia, unspecified: Secondary | ICD-10-CM | POA: Diagnosis not present

## 2023-11-30 DIAGNOSIS — Z823 Family history of stroke: Secondary | ICD-10-CM

## 2023-11-30 DIAGNOSIS — J9601 Acute respiratory failure with hypoxia: Secondary | ICD-10-CM | POA: Diagnosis not present

## 2023-11-30 DIAGNOSIS — E1140 Type 2 diabetes mellitus with diabetic neuropathy, unspecified: Secondary | ICD-10-CM | POA: Diagnosis not present

## 2023-11-30 DIAGNOSIS — C259 Malignant neoplasm of pancreas, unspecified: Secondary | ICD-10-CM | POA: Diagnosis present

## 2023-11-30 DIAGNOSIS — K921 Melena: Secondary | ICD-10-CM | POA: Diagnosis not present

## 2023-11-30 DIAGNOSIS — E785 Hyperlipidemia, unspecified: Secondary | ICD-10-CM | POA: Diagnosis not present

## 2023-11-30 DIAGNOSIS — M47812 Spondylosis without myelopathy or radiculopathy, cervical region: Secondary | ICD-10-CM | POA: Diagnosis present

## 2023-11-30 DIAGNOSIS — E8809 Other disorders of plasma-protein metabolism, not elsewhere classified: Secondary | ICD-10-CM | POA: Diagnosis not present

## 2023-11-30 DIAGNOSIS — Z79899 Other long term (current) drug therapy: Secondary | ICD-10-CM

## 2023-11-30 DIAGNOSIS — I5043 Acute on chronic combined systolic (congestive) and diastolic (congestive) heart failure: Secondary | ICD-10-CM | POA: Diagnosis not present

## 2023-11-30 DIAGNOSIS — E1169 Type 2 diabetes mellitus with other specified complication: Secondary | ICD-10-CM

## 2023-11-30 DIAGNOSIS — D72829 Elevated white blood cell count, unspecified: Secondary | ICD-10-CM | POA: Diagnosis not present

## 2023-11-30 DIAGNOSIS — I252 Old myocardial infarction: Secondary | ICD-10-CM

## 2023-11-30 DIAGNOSIS — Z8673 Personal history of transient ischemic attack (TIA), and cerebral infarction without residual deficits: Secondary | ICD-10-CM

## 2023-11-30 DIAGNOSIS — Z7901 Long term (current) use of anticoagulants: Secondary | ICD-10-CM | POA: Diagnosis not present

## 2023-11-30 DIAGNOSIS — S0990XA Unspecified injury of head, initial encounter: Secondary | ICD-10-CM | POA: Diagnosis not present

## 2023-11-30 DIAGNOSIS — Z7984 Long term (current) use of oral hypoglycemic drugs: Secondary | ICD-10-CM

## 2023-11-30 DIAGNOSIS — K669 Disorder of peritoneum, unspecified: Secondary | ICD-10-CM | POA: Diagnosis present

## 2023-11-30 DIAGNOSIS — J440 Chronic obstructive pulmonary disease with acute lower respiratory infection: Secondary | ICD-10-CM | POA: Diagnosis present

## 2023-11-30 DIAGNOSIS — D649 Anemia, unspecified: Secondary | ICD-10-CM | POA: Diagnosis not present

## 2023-11-30 DIAGNOSIS — J189 Pneumonia, unspecified organism: Secondary | ICD-10-CM | POA: Diagnosis present

## 2023-11-30 DIAGNOSIS — J90 Pleural effusion, not elsewhere classified: Secondary | ICD-10-CM | POA: Diagnosis not present

## 2023-11-30 DIAGNOSIS — I2510 Atherosclerotic heart disease of native coronary artery without angina pectoris: Secondary | ICD-10-CM | POA: Diagnosis present

## 2023-11-30 DIAGNOSIS — Z888 Allergy status to other drugs, medicaments and biological substances status: Secondary | ICD-10-CM | POA: Diagnosis not present

## 2023-11-30 DIAGNOSIS — M62838 Other muscle spasm: Secondary | ICD-10-CM | POA: Diagnosis present

## 2023-11-30 DIAGNOSIS — Z7982 Long term (current) use of aspirin: Secondary | ICD-10-CM

## 2023-11-30 DIAGNOSIS — I110 Hypertensive heart disease with heart failure: Secondary | ICD-10-CM | POA: Diagnosis not present

## 2023-11-30 DIAGNOSIS — M5412 Radiculopathy, cervical region: Secondary | ICD-10-CM | POA: Diagnosis present

## 2023-11-30 DIAGNOSIS — M4802 Spinal stenosis, cervical region: Secondary | ICD-10-CM | POA: Diagnosis present

## 2023-11-30 DIAGNOSIS — C787 Secondary malignant neoplasm of liver and intrahepatic bile duct: Secondary | ICD-10-CM | POA: Diagnosis not present

## 2023-11-30 DIAGNOSIS — Z8249 Family history of ischemic heart disease and other diseases of the circulatory system: Secondary | ICD-10-CM

## 2023-11-30 DIAGNOSIS — K649 Unspecified hemorrhoids: Secondary | ICD-10-CM | POA: Diagnosis not present

## 2023-11-30 DIAGNOSIS — K625 Hemorrhage of anus and rectum: Secondary | ICD-10-CM | POA: Diagnosis not present

## 2023-11-30 DIAGNOSIS — R54 Age-related physical debility: Secondary | ICD-10-CM | POA: Diagnosis present

## 2023-11-30 DIAGNOSIS — R590 Localized enlarged lymph nodes: Secondary | ICD-10-CM | POA: Diagnosis present

## 2023-11-30 LAB — CBC
HCT: 35.4 % — ABNORMAL LOW (ref 39.0–52.0)
Hemoglobin: 11.9 g/dL — ABNORMAL LOW (ref 13.0–17.0)
MCH: 30.7 pg (ref 26.0–34.0)
MCHC: 33.6 g/dL (ref 30.0–36.0)
MCV: 91.2 fL (ref 80.0–100.0)
Platelets: 161 10*3/uL (ref 150–400)
RBC: 3.88 MIL/uL — ABNORMAL LOW (ref 4.22–5.81)
RDW: 14.7 % (ref 11.5–15.5)
WBC: 12.9 10*3/uL — ABNORMAL HIGH (ref 4.0–10.5)
nRBC: 0 % (ref 0.0–0.2)

## 2023-11-30 LAB — BASIC METABOLIC PANEL
Anion gap: 9 (ref 5–15)
BUN: 27 mg/dL — ABNORMAL HIGH (ref 8–23)
CO2: 26 mmol/L (ref 22–32)
Calcium: 8.6 mg/dL — ABNORMAL LOW (ref 8.9–10.3)
Chloride: 102 mmol/L (ref 98–111)
Creatinine, Ser: 1 mg/dL (ref 0.61–1.24)
GFR, Estimated: >60 mL/min (ref >60)
Glucose, Bld: 168 mg/dL — ABNORMAL HIGH (ref 70–99)
Potassium: 4.2 mmol/L (ref 3.5–5.1)
Sodium: 137 mmol/L (ref 135–145)

## 2023-11-30 LAB — TROPONIN I (HIGH SENSITIVITY)
Troponin I (High Sensitivity): 57 ng/L — ABNORMAL HIGH (ref <18)
Troponin I (High Sensitivity): 62 ng/L — ABNORMAL HIGH (ref <18)

## 2023-11-30 LAB — LACTIC ACID, PLASMA
Lactic Acid, Venous: 1.2 mmol/L (ref 0.5–1.9)
Lactic Acid, Venous: 1.2 mmol/L (ref 0.5–1.9)

## 2023-11-30 LAB — RESP PANEL BY RT-PCR (RSV, FLU A&B, COVID)  RVPGX2
Influenza A by PCR: NEGATIVE
Influenza B by PCR: NEGATIVE
Resp Syncytial Virus by PCR: NEGATIVE
SARS Coronavirus 2 by RT PCR: NEGATIVE

## 2023-11-30 LAB — GLUCOSE, CAPILLARY: Glucose-Capillary: 129 mg/dL — ABNORMAL HIGH (ref 70–99)

## 2023-11-30 LAB — BRAIN NATRIURETIC PEPTIDE: B Natriuretic Peptide: 475.3 pg/mL — ABNORMAL HIGH (ref 0.0–100.0)

## 2023-11-30 MED ORDER — INSULIN ASPART 100 UNIT/ML IJ SOLN
0.0000 [IU] | Freq: Three times a day (TID) | INTRAMUSCULAR | Status: DC
  Administered 2023-12-01 – 2023-12-02 (×3): 2 [IU] via SUBCUTANEOUS
  Administered 2023-12-02: 3 [IU] via SUBCUTANEOUS
  Administered 2023-12-03: 2 [IU] via SUBCUTANEOUS
  Administered 2023-12-03: 3 [IU] via SUBCUTANEOUS
  Administered 2023-12-04 (×2): 2 [IU] via SUBCUTANEOUS
  Administered 2023-12-05: 3 [IU] via SUBCUTANEOUS
  Administered 2023-12-05: 2 [IU] via SUBCUTANEOUS
  Administered 2023-12-06: 3 [IU] via SUBCUTANEOUS
  Administered 2023-12-06 (×2): 2 [IU] via SUBCUTANEOUS
  Administered 2023-12-07: 5 [IU] via SUBCUTANEOUS
  Administered 2023-12-07 (×2): 2 [IU] via SUBCUTANEOUS
  Administered 2023-12-08: 3 [IU] via SUBCUTANEOUS
  Administered 2023-12-08: 2 [IU] via SUBCUTANEOUS
  Administered 2023-12-09 – 2023-12-10 (×4): 3 [IU] via SUBCUTANEOUS
  Administered 2023-12-10 – 2023-12-11 (×4): 2 [IU] via SUBCUTANEOUS

## 2023-11-30 MED ORDER — GABAPENTIN 300 MG PO CAPS
300.0000 mg | ORAL_CAPSULE | Freq: Three times a day (TID) | ORAL | Status: DC
  Administered 2023-11-30 – 2023-12-11 (×33): 300 mg via ORAL
  Filled 2023-11-30 (×33): qty 1

## 2023-11-30 MED ORDER — POLYETHYLENE GLYCOL 3350 17 G PO PACK: 17.0000 g | PACK | Freq: Every day | ORAL | Status: DC | PRN

## 2023-11-30 MED ORDER — INSULIN ASPART 100 UNIT/ML IJ SOLN: 0.0000 [IU] | Freq: Every day | INTRAMUSCULAR | Status: DC

## 2023-11-30 MED ORDER — FENOFIBRATE 54 MG PO TABS
54.0000 mg | ORAL_TABLET | Freq: Every day | ORAL | Status: DC
  Administered 2023-12-01 – 2023-12-11 (×11): 54 mg via ORAL
  Filled 2023-11-30 (×11): qty 1

## 2023-11-30 MED ORDER — SPIRONOLACTONE 12.5 MG HALF TABLET
12.5000 mg | ORAL_TABLET | Freq: Every day | ORAL | Status: DC
  Administered 2023-12-01 – 2023-12-11 (×11): 12.5 mg via ORAL
  Filled 2023-11-30 (×11): qty 1

## 2023-11-30 MED ORDER — BUPROPION HCL ER (SR) 150 MG PO TB12
150.0000 mg | ORAL_TABLET | Freq: Every day | ORAL | Status: DC
  Administered 2023-12-01 – 2023-12-11 (×11): 150 mg via ORAL
  Filled 2023-11-30 (×11): qty 1

## 2023-11-30 MED ORDER — RIVAROXABAN 20 MG PO TABS
20.0000 mg | ORAL_TABLET | Freq: Every day | ORAL | Status: DC
  Administered 2023-11-30 – 2023-12-04 (×5): 20 mg via ORAL
  Filled 2023-11-30 (×5): qty 1

## 2023-11-30 MED ORDER — ALBUTEROL SULFATE (2.5 MG/3ML) 0.083% IN NEBU: 2.5000 mg | INHALATION_SOLUTION | RESPIRATORY_TRACT | Status: DC | PRN

## 2023-11-30 MED ORDER — SODIUM CHLORIDE 0.9 % IV SOLN
1.0000 g | Freq: Once | INTRAVENOUS | Status: AC
  Administered 2023-11-30: 1 g via INTRAVENOUS
  Filled 2023-11-30: qty 10

## 2023-11-30 MED ORDER — ACETAMINOPHEN 325 MG PO TABS
650.0000 mg | ORAL_TABLET | Freq: Four times a day (QID) | ORAL | Status: DC | PRN
  Administered 2023-12-01 – 2023-12-10 (×11): 650 mg via ORAL
  Filled 2023-11-30 (×12): qty 2

## 2023-11-30 MED ORDER — SODIUM CHLORIDE 0.9 % IV SOLN
1.0000 g | INTRAVENOUS | Status: AC
  Administered 2023-12-01 – 2023-12-04 (×4): 1 g via INTRAVENOUS
  Filled 2023-11-30 (×4): qty 10

## 2023-11-30 MED ORDER — CARVEDILOL 6.25 MG PO TABS
6.2500 mg | ORAL_TABLET | Freq: Two times a day (BID) | ORAL | Status: DC
  Administered 2023-11-30 – 2023-12-11 (×22): 6.25 mg via ORAL
  Filled 2023-11-30 (×22): qty 1

## 2023-11-30 MED ORDER — ACETAMINOPHEN 650 MG RE SUPP: 650.0000 mg | Freq: Four times a day (QID) | RECTAL | Status: DC | PRN

## 2023-11-30 MED ORDER — HYDRALAZINE HCL 10 MG PO TABS
10.0000 mg | ORAL_TABLET | Freq: Two times a day (BID) | ORAL | Status: DC
  Administered 2023-11-30 – 2023-12-11 (×22): 10 mg via ORAL
  Filled 2023-11-30 (×22): qty 1

## 2023-11-30 MED ORDER — DOXYCYCLINE HYCLATE 100 MG PO TABS
100.0000 mg | ORAL_TABLET | Freq: Once | ORAL | Status: AC
  Administered 2023-11-30: 100 mg via ORAL
  Filled 2023-11-30: qty 1

## 2023-11-30 MED ORDER — SODIUM CHLORIDE 0.9 % IV SOLN
500.0000 mg | INTRAVENOUS | Status: AC
  Administered 2023-12-01 – 2023-12-04 (×4): 500 mg via INTRAVENOUS
  Filled 2023-11-30 (×4): qty 5

## 2023-11-30 MED ORDER — ISOSORBIDE MONONITRATE ER 30 MG PO TB24
15.0000 mg | ORAL_TABLET | Freq: Every day | ORAL | Status: DC
  Administered 2023-11-30 – 2023-12-11 (×12): 15 mg via ORAL
  Filled 2023-11-30 (×12): qty 1

## 2023-11-30 MED ORDER — ONDANSETRON HCL 4 MG PO TABS: 4.0000 mg | ORAL_TABLET | Freq: Four times a day (QID) | ORAL | Status: DC | PRN

## 2023-11-30 MED ORDER — SODIUM CHLORIDE 0.9 % IV BOLUS
1000.0000 mL | Freq: Once | INTRAVENOUS | Status: AC
  Administered 2023-11-30: 1000 mL via INTRAVENOUS

## 2023-11-30 MED ORDER — TRAZODONE HCL 50 MG PO TABS
50.0000 mg | ORAL_TABLET | Freq: Every evening | ORAL | Status: DC | PRN
  Administered 2023-12-02 – 2023-12-10 (×3): 50 mg via ORAL
  Filled 2023-11-30 (×3): qty 1

## 2023-11-30 MED ORDER — OXYCODONE-ACETAMINOPHEN 5-325 MG PO TABS: 0.5000 | ORAL_TABLET | Freq: Four times a day (QID) | ORAL | Status: DC | PRN

## 2023-11-30 MED ORDER — CYCLOBENZAPRINE HCL 5 MG PO TABS
5.0000 mg | ORAL_TABLET | Freq: Three times a day (TID) | ORAL | Status: DC | PRN
  Administered 2023-12-04 – 2023-12-10 (×4): 5 mg via ORAL
  Filled 2023-11-30 (×5): qty 1

## 2023-11-30 MED ORDER — ROSUVASTATIN CALCIUM 10 MG PO TABS
20.0000 mg | ORAL_TABLET | Freq: Every day | ORAL | Status: DC
  Administered 2023-12-01 – 2023-12-11 (×11): 20 mg via ORAL
  Filled 2023-11-30 (×11): qty 2

## 2023-11-30 MED ORDER — ONDANSETRON HCL 4 MG/2ML IJ SOLN: 4.0000 mg | Freq: Four times a day (QID) | INTRAMUSCULAR | Status: DC | PRN

## 2023-11-30 NOTE — ED Notes (Signed)
Unable to provide specimen at this time 

## 2023-11-30 NOTE — ED Notes (Signed)
Called [facility] for transfer @14 :51.  Spoke with Maisie Fus

## 2023-11-30 NOTE — ED Notes (Signed)
ED TO INPATIENT HANDOFF REPORT  ED Nurse Name and Phone #: Zella Ball [phone]  S Name/Age/Gender Nathan Fernandez 82 y.o. male Room/Bed: MH11/MH11  Code Status   Code Status: Prior  Home/SNF/Other Home Patient oriented to: self, place, time, and situation Is this baseline? Yes   Triage Complete: Triage complete  Chief Complaint CAP (community acquired pneumonia) [J18.9]  Triage Note Recurrent fall 2 days ago , weakness to left armand left leg 3 weeks ago .  Spouse reports Hx multiple falls .    Allergies Allergies  Allergen Reactions   Ace Inhibitors Swelling    Swelling of the tongue   Angiotensin Receptor Blockers Swelling    Tongue swelling   Vasotec [Enalapril] Swelling    Tongue swelling   Pravachol [Pravastatin] Other (See Comments)    Myalgias     Level of Care/Admitting Diagnosis ED Disposition     ED Disposition  Admit   Condition  --   Comment  Hospital Area: [facility] [[id number]]  Level of Care: Med-Surg [16]  Interfacility transfer: Yes  May place patient in observation at [facility] or [facility] if equivalent level of care is available:: Yes  Covid Evaluation: Asymptomatic - no recent exposure (last 10 days) testing not required  Diagnosis: CAP (community acquired pneumonia) [454098]  Admitting Physician: Kirby Crigler, Parks Neptune [[id number]]  Attending Physician: Kirby Crigler, MIR [email or web address] [[id number]]          B Medical/Surgery History 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   Hosp R facial weakness   CAD (coronary artery disease)    MI, Hosp 1993, PTCA; LHC 04/18/13: Distal left main 40%, proximal LAD with several aneurysmal segments, proximal LAD 50% prior to and after aneurysmal segments, area does not appear to flow-limiting, proximal diagonal 70%, ostial circumflex 20%, proximal circumflex 20%, OM1 30-40%, proximal RCA 40%, mid RCA 30%, distal RCA 20%, EF 50% => med Rx   Cancer (HCC)    skin cancer - 2020   COPD  (chronic obstructive pulmonary disease) (HCC)    DM2 (diabetes mellitus, type 2) (HCC)    DVT (deep venous thrombosis) (HCC) 12/2021   History of ETT 1998   wnl   History of hiatal hernia    History of MRI 08/04/2009   brain- atrophy sm vess dz   HLD (hyperlipidemia)    HTN (hypertension)    Myocardial infarction (HCC)    OA (osteoarthritis)    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HCC)    05/09/12 [place]: 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)    Past Surgical History:  Procedure Laterality Date   BUBBLE STUDY  05/15/2020   Procedure: BUBBLE STUDY;  Surgeon: Chrystie Nose, MD;  Location: [facility] ENDOSCOPY;  Service: Cardiovascular;;   CARDIAC CATHETERIZATION     cataract surgery  08/2005   repair lens which moved   EYE SURGERY     LEFT HEART CATH AND CORONARY ANGIOGRAPHY N/A 01/21/2019   Procedure: LEFT HEART CATH AND CORONARY ANGIOGRAPHY;  Surgeon: Kathleene Hazel, MD;  Location: [facility] INVASIVE CV LAB;  Service: Cardiovascular;  Laterality: N/A;   LEFT HEART CATH AND CORONARY ANGIOGRAPHY N/A 02/13/2021   Procedure: LEFT HEART CATH AND CORONARY ANGIOGRAPHY;  Surgeon: Iran Ouch, MD;  Location: [facility] INVASIVE CV LAB;  Service: Cardiovascular;  Laterality: N/A;  LOOP RECORDER INSERTION N/A 05/15/2020   Procedure: LOOP RECORDER INSERTION;  Surgeon: Hillis Range, MD;  Location: [facility] INVASIVE CV LAB;  Service: Cardiovascular;  Laterality: N/A;   LUMBAR LAMINECTOMY/DECOMPRESSION MICRODISCECTOMY N/A 06/19/2021   Procedure: Lumbar Two-Three, Lumbar Three-Four, Lumbar Four-Five Laminectomy and Foraminotomy;  Surgeon: Donalee Citrin, MD;  Location: [facility] OR;  Service: Neurosurgery;  Laterality: N/A;   LUMBAR LAMINECTOMY/DECOMPRESSION MICRODISCECTOMY Bilateral 07/28/2022   Procedure: Sublaminar decompression - Lumbar two-Lumbar three - bilateral  redo;  Surgeon: Donalee Citrin, MD;  Location: [facility] OR;  Service: Neurosurgery;  Laterality: Bilateral;  3C   RIGHT/LEFT HEART CATH AND CORONARY ANGIOGRAPHY N/A 09/29/2023   Procedure: RIGHT/LEFT HEART CATH AND CORONARY ANGIOGRAPHY;  Surgeon: Elder Negus, MD;  Location: [facility] INVASIVE CV LAB;  Service: Cardiovascular;  Laterality: N/A;   stress myoview  06/29/2006   sm distal anteroseptal & apical infarct   TEE WITHOUT CARDIOVERSION N/A 05/15/2020   Procedure: TRANSESOPHAGEAL ECHOCARDIOGRAM (TEE);  Surgeon: Chrystie Nose, MD;  Location: [facility] ENDOSCOPY;  Service: Cardiovascular;  Laterality: N/A;   THYROIDECTOMY, PARTIAL  1967   B9 growth   TONSILLECTOMY       A IV Location/Drains/Wounds Patient Lines/Drains/Airways Status     Active Line/Drains/Airways     Name Placement date Placement time Site Days   Peripheral IV 11/30/23 20 G Right Antecubital 11/30/23  1047  Antecubital  less than 1            Intake/Output Last 24 hours No intake or output data in the 24 hours ending 11/30/23 1413  Labs/Imaging Results for orders placed or performed during the hospital encounter of 11/30/23 (from the past 48 hour(s))  Basic metabolic panel     Status: Abnormal   Collection Time: 11/30/23 10:19 AM  Result Value Ref Range   Sodium 137 135 - 145 mmol/L   Potassium 4.2 3.5 - 5.1 mmol/L   Chloride 102 98 - 111 mmol/L   CO2 26 22 - 32 mmol/L   Glucose, Bld 168 (H) 70 - 99 mg/dL    Comment: Glucose reference range applies only to samples taken after fasting for at least 8 hours.   BUN 27 (H) 8 - 23 mg/dL   Creatinine, Ser 5.28 0.61 - 1.24 mg/dL   Calcium 8.6 (L) 8.9 - 10.3 mg/dL   GFR, Estimated >41 >32 mL/min    Comment: (NOTE) Calculated using the CKD-EPI Creatinine Equation (2021)    Anion gap 9 5 - 15    Comment: Performed at [facility], [address] [place], [place] [address]  CBC     Status: Abnormal   Collection Time: 11/30/23 10:19 AM  Result Value Ref Range    WBC 12.9 (H) 4.0 - 10.5 K/uL   RBC 3.88 (L) 4.22 - 5.81 MIL/uL   Hemoglobin 11.9 (L) 13.0 - 17.0 g/dL   HCT 27.2 (L) 53.6 - 64.4 %   MCV 91.2 80.0 - 100.0 fL   MCH 30.7 26.0 - 34.0 pg   MCHC 33.6 30.0 - 36.0 g/dL   RDW 03.4 74.2 - 59.5 %   Platelets 161 150 - 400 K/uL   nRBC 0.0 0.0 - 0.2 %    Comment: Performed at [facility], [address] [place], [place] [address]  Troponin I (High Sensitivity)     Status: Abnormal   Collection Time: 11/30/23 10:19 AM  Result Value Ref Range   Troponin I (High Sensitivity) 57 (H) <18 ng/L  Comment: (NOTE) Elevated high sensitivity troponin I (hsTnI) values and significant  changes across serial measurements may suggest ACS but many other  chronic and acute conditions are known to elevate hsTnI results.  Refer to the "Links" section for chest pain algorithms and additional  guidance. Performed at [facility], [address] [place], [place] [address]   Brain natriuretic peptide     Status: Abnormal   Collection Time: 11/30/23 10:19 AM  Result Value Ref Range   B Natriuretic Peptide 475.3 (H) 0.0 - 100.0 pg/mL    Comment: Performed at [facility], [address] [place], [place] [address]  Resp panel by RT-PCR (RSV, Flu A&B, Covid) Anterior Nasal Swab     Status: None   Collection Time: 11/30/23 10:19 AM   Specimen: Anterior Nasal Swab  Result Value Ref Range   SARS Coronavirus 2 by RT PCR NEGATIVE NEGATIVE    Comment: (NOTE) SARS-CoV-2 target nucleic acids are NOT DETECTED.  The SARS-CoV-2 RNA is generally detectable in upper respiratory specimens during the acute phase of infection. The lowest concentration of SARS-CoV-2 viral copies this assay can detect is 138 copies/mL. A negative result does not preclude SARS-Cov-2 infection and should not be used as the sole basis for treatment or other patient management decisions. A negative result may occur with  improper specimen collection/handling,  submission of specimen other than nasopharyngeal swab, presence of viral mutation(s) within the areas targeted by this assay, and inadequate number of viral copies(<138 copies/mL). A negative result must be combined with clinical observations, patient history, and epidemiological information. The expected result is Negative.  Fact Sheet for Patients:  BloggerCourse.com  Fact Sheet for Healthcare Providers:  SeriousBroker.it  This test is no t yet approved or cleared by the [place] FDA and  has been authorized for detection and/or diagnosis of SARS-CoV-2 by FDA under an Emergency Use Authorization (EUA). This EUA will remain  in effect (meaning this test can be used) for the duration of the COVID-19 declaration under Section 564(b)(1) of the Act, 21 U.S.C.section 360bbb-3(b)(1), unless the authorization is terminated  or revoked sooner.       Influenza A by PCR NEGATIVE NEGATIVE   Influenza B by PCR NEGATIVE NEGATIVE    Comment: (NOTE) The Xpert Xpress SARS-CoV-2/FLU/RSV plus assay is intended as an aid in the diagnosis of influenza from Nasopharyngeal swab specimens and should not be used as a sole basis for treatment. Nasal washings and aspirates are unacceptable for Xpert Xpress SARS-CoV-2/FLU/RSV testing.  Fact Sheet for Patients: BloggerCourse.com  Fact Sheet for Healthcare Providers: SeriousBroker.it  This test is not yet approved or cleared by the [place] FDA and has been authorized for detection and/or diagnosis of SARS-CoV-2 by FDA under an Emergency Use Authorization (EUA). This EUA will remain in effect (meaning this test can be used) for the duration of the COVID-19 declaration under Section 564(b)(1) of the Act, 21 U.S.C. section 360bbb-3(b)(1), unless the authorization is terminated or revoked.     Resp Syncytial Virus by PCR NEGATIVE NEGATIVE     Comment: (NOTE) Fact Sheet for Patients: BloggerCourse.com  Fact Sheet for Healthcare Providers: SeriousBroker.it  This test is not yet approved or cleared by the [place] FDA and has been authorized for detection and/or diagnosis of SARS-CoV-2 by FDA under an Emergency Use Authorization (EUA). This EUA will remain in effect (meaning this test can be used) for the duration of the COVID-19 declaration under Section 564(b)(1) of  the Act, 21 U.S.C. section 360bbb-3(b)(1), unless the authorization is terminated or revoked.  Performed at [facility], [address] [place], [place] [address]    CT Head Wo Contrast  Result Date: 11/30/2023 CLINICAL DATA:  Polytrauma, blunt.  Recurrent falls 2 days ago. EXAM: CT HEAD WITHOUT CONTRAST TECHNIQUE: Contiguous axial images were obtained from the base of the skull through the vertex without intravenous contrast. RADIATION DOSE REDUCTION: This exam was performed according to the departmental dose-optimization program which includes automated exposure control, adjustment of the mA and/or kV according to patient size and/or use of iterative reconstruction technique. COMPARISON:  Head CT 08/04/2009.  MRI brain 08/05/2009. FINDINGS: Brain: No acute hemorrhage. Cortical gray-white differentiation is preserved. Patchy hypoattenuation of the cerebral white matter, most consistent with mild chronic small-vessel disease. Prominence of the ventricles and sulci within normal limits for age. No extra-axial collection. Basilar cisterns are patent. Vascular: No hyperdense vessel or unexpected calcification. Skull: No calvarial fracture or suspicious bone lesion. Skull base is unremarkable. Sinuses/Orbits: No acute finding. Other: None. IMPRESSION: 1. No acute intracranial abnormality. 2. Mild chronic small-vessel disease. Electronically Signed   By: Orvan Falconer M.D.   On: 11/30/2023 11:39    Pending  Labs Unresulted Labs (From admission, onward)     Start     Ordered   11/30/23 1044  Urinalysis, Routine w reflex microscopic -Urine, Clean Catch  Once,   URGENT       Question:  Specimen Source  Answer:  Urine, Clean Catch   11/30/23 1044            Vitals/Pain Today's Vitals   11/30/23 1017 11/30/23 1100 11/30/23 1145 11/30/23 1356  BP: (!) 148/100  (!) 150/94   Pulse: 91 87 88   Resp: 20 19 19    Temp:      SpO2: 90% 96% 94%   Weight:      PainSc:    0-No pain    Isolation Precautions No active isolations  Medications Medications  cefTRIAXone (ROCEPHIN) 1 g in sodium chloride 0.9 % 100 mL IVPB (1 g Intravenous New Bag/Given 11/30/23 1350)  doxycycline (VIBRA-TABS) tablet 100 mg (100 mg Oral Given 11/30/23 1346)    Mobility walks with device     Focused Assessments    R Recommendations: See Admitting Provider Note  Report given to:   Additional Notes: 02 2 L via [place]

## 2023-11-30 NOTE — Plan of Care (Signed)

## 2023-11-30 NOTE — ED Notes (Signed)
Family at bedside. 

## 2023-11-30 NOTE — ED Triage Notes (Signed)
Recurrent fall 2 days ago , weakness to left armand left leg 3 weeks ago .  Spouse reports Hx multiple falls .

## 2023-11-30 NOTE — ED Notes (Signed)
Pt oxygen noted to be around 88% on 2L. This nurse went into pt room and adjusted oxygen to 4L via nasal cannula. Respiratory called to room for further evaluation.

## 2023-11-30 NOTE — ED Notes (Signed)
Fall risk armband Fall risk sign on door Patient wearing shoes

## 2023-11-30 NOTE — H&P (Signed)
History and Physical  Nathan Fernandez UJW:[id number] DOB: 07/05/1941 DOA: 11/30/2023  PCP: Joaquim Nam, MD   Chief Complaint: Weakness  HPI: Nathan Fernandez is a 82 y.o. male with medical history significant for CAD, COPD on room air, non-insulin-dependent type 2 diabetes, history of DVT on Xarelto being admitted to the hospital with community-acquired pneumonia.  He lives independently in the community with his wife, who came with him to [facility] this morning, complaining of multiple falls over the past week due to weakness in his left arm and leg.  He has been gradually worsening.  He has some chronic cervical radiculopathy, is on Flexeril was on oxycodone for this previously.  Oxycodone was recently discontinued due to some mild confusion.  Indicates, patient tells me that he has had some worsening of his chronic cough.  Due to the persistent weakness, he came to the ER for evaluation, was found on CT scan to have evidence of possible community-acquired pneumonia.  He was started on empiric IV antibiotics, he was placed on 4 L nasal cannula oxygen due to hypoxia.  Review of Systems: Please see HPI for pertinent positives and negatives. A complete 10 system review of systems are otherwise negative.  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   Hosp R facial weakness   CAD (coronary artery disease)    MI, Hosp 1993, PTCA; LHC 04/18/13: Distal left main 40%, proximal LAD with several aneurysmal segments, proximal LAD 50% prior to and after aneurysmal segments, area does not appear to flow-limiting, proximal diagonal 70%, ostial circumflex 20%, proximal circumflex 20%, OM1 30-40%, proximal RCA 40%, mid RCA 30%, distal RCA 20%, EF 50% => med Rx   Cancer (HCC)    skin cancer - 2020   COPD (chronic obstructive pulmonary disease) (HCC)    DM2 (diabetes mellitus, type 2) (HCC)    DVT (deep venous thrombosis) (HCC) 12/2021   History of ETT 1998   wnl   History of  hiatal hernia    History of MRI 08/04/2009   brain- atrophy sm vess dz   HLD (hyperlipidemia)    HTN (hypertension)    Myocardial infarction (HCC)    OA (osteoarthritis)    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HCC)    05/09/12 [place]: 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, large collateral proximal to aneurysm   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   Stroke [facility])    Past Surgical History:  Procedure Laterality Date   BUBBLE STUDY  05/15/2020   Procedure: BUBBLE STUDY;  Surgeon: Chrystie Nose, MD;  Location: [facility] ENDOSCOPY;  Service: Cardiovascular;;   CARDIAC CATHETERIZATION     cataract surgery  08/2005   repair lens which moved   EYE SURGERY     LEFT HEART CATH AND CORONARY ANGIOGRAPHY N/A 01/21/2019   Procedure: LEFT HEART CATH AND CORONARY ANGIOGRAPHY;  Surgeon: Kathleene Hazel, MD;  Location: [facility] INVASIVE CV LAB;  Service: Cardiovascular;  Laterality: N/A;   LEFT HEART CATH AND CORONARY ANGIOGRAPHY N/A 02/13/2021   Procedure: LEFT HEART CATH AND CORONARY ANGIOGRAPHY;  Surgeon: Iran Ouch, MD;  Location: [facility] INVASIVE CV LAB;  Service: Cardiovascular;  Laterality: N/A;   LOOP RECORDER INSERTION N/A 05/15/2020   Procedure: LOOP RECORDER INSERTION;  Surgeon: Hillis Range, MD;  Location: [facility] INVASIVE CV LAB;  Service: Cardiovascular;  Laterality: N/A;   LUMBAR LAMINECTOMY/DECOMPRESSION MICRODISCECTOMY N/A 06/19/2021   Procedure:  Lumbar Two-Three, Lumbar Three-Four, Lumbar Four-Five Laminectomy and Foraminotomy;  Surgeon: Donalee Citrin, MD;  Location: [facility] OR;  Service: Neurosurgery;  Laterality: N/A;   LUMBAR LAMINECTOMY/DECOMPRESSION MICRODISCECTOMY Bilateral 07/28/2022   Procedure: Sublaminar decompression - Lumbar two-Lumbar three - bilateral redo;  Surgeon: Donalee Citrin, MD;  Location: [facility] OR;  Service: Neurosurgery;  Laterality: Bilateral;  3C   RIGHT/LEFT HEART CATH AND CORONARY ANGIOGRAPHY N/A 09/29/2023    Procedure: RIGHT/LEFT HEART CATH AND CORONARY ANGIOGRAPHY;  Surgeon: Elder Negus, MD;  Location: [facility] INVASIVE CV LAB;  Service: Cardiovascular;  Laterality: N/A;   stress myoview  06/29/2006   sm distal anteroseptal & apical infarct   TEE WITHOUT CARDIOVERSION N/A 05/15/2020   Procedure: TRANSESOPHAGEAL ECHOCARDIOGRAM (TEE);  Surgeon: Chrystie Nose, MD;  Location: [facility] ENDOSCOPY;  Service: Cardiovascular;  Laterality: N/A;   THYROIDECTOMY, PARTIAL  1967   B9 growth   TONSILLECTOMY      Social History:  reports that he has quit smoking. His smoking use included cigars. He has never used smokeless tobacco. He reports current alcohol use. He reports that he does not use drugs.   Allergies  Allergen Reactions   Ace Inhibitors Swelling    Swelling of the tongue   Angiotensin Receptor Blockers Swelling    Tongue swelling   Vasotec [Enalapril] Swelling    Tongue swelling   Pravachol [Pravastatin] Other (See Comments)    Myalgias     Family History  Problem Relation Age of Onset   Hypertension Mother    Aneurysm Mother    Stroke Mother    Hypertension Father    Heart disease Father        CAD   Heart disease Son        MI   Colon cancer Neg Hx    Prostate cancer Neg Hx      Prior to Admission medications   Medication Sig Start Date End Date Taking? Authorizing Provider  albuterol (VENTOLIN HFA) 108 (90 Base) MCG/ACT inhaler Inhale 1-2 puffs into the lungs every 6 (six) hours as needed (COPD). 05/01/22   Joaquim Nam, MD  Ascorbic Acid (VITAMIN C PO) Take 1 tablet by mouth daily.    [provider]  B Complex CAPS Take 1 capsule by mouth daily.    [provider]  buPROPion (WELLBUTRIN SR) 150 MG 12 hr tablet Take 1 tablet (150 mg total) by mouth daily. 11/13/23   Joaquim Nam, MD  carvedilol (COREG) 6.25 MG tablet TAKE 1 TABLET BY MOUTH TWICE A DAY 11/08/23   Joaquim Nam, MD  Cholecalciferol (VITAMIN D-3 PO) Take 1 capsule by mouth daily.     [provider]  cyclobenzaprine (FLEXERIL) 5 MG tablet Take 1 tablet (5 mg total) by mouth 3 (three) times daily as needed for muscle spasms. 11/22/23   Joaquim Nam, MD  dorzolamide-timolol (COSOPT) 22.3-6.8 MG/ML ophthalmic solution Place 1 drop into both eyes 2 (two) times daily. 02/13/20   [provider]  ELDERBERRY PO Take 1 capsule by mouth in the morning and at bedtime.    [provider]  fenofibrate (TRICOR) 48 MG tablet TAKE 1 TABLET BY MOUTH EVERY DAY 11/10/23   Joaquim Nam, MD  furosemide (LASIX) 20 MG tablet Take 20 mg by mouth as needed for fluid or edema.    [provider]  gabapentin (NEURONTIN) 300 MG capsule Take 1 capsule (300 mg total) by mouth 3 (three) times daily. 11/13/23   Joaquim Nam,  MD  hydrALAZINE (APRESOLINE) 10 MG tablet TAKE 1 TABLET BY MOUTH EVERY 12 HOURS. 07/22/23   Wendall Stade, MD  isosorbide mononitrate (IMDUR) 30 MG 24 hr tablet TAKE 1/2 TABLET BY MOUTH DAILY 07/22/23   Wendall Stade, MD  ketoconazole (NIZORAL) 2 % cream APPLY 1 APPLICATION TOPICALLY DAILY AS NEEDED FOR IRRITATION. 03/05/23   Joaquim Nam, MD  lidocaine (XYLOCAINE) 5 % ointment Apply 1 Application topically as needed. 10/31/23   Glyn Ade, MD  nitroGLYCERIN (NITROSTAT) 0.4 MG SL tablet Place 1 tablet (0.4 mg total) under the tongue every 5 (five) minutes as needed for chest pain. Up to 3 doses 07/22/23   Swinyer, Zachary George, NP  oxyCODONE-acetaminophen (PERCOCET/ROXICET) 5-325 MG tablet Take 0.5-1 tablets by mouth every 6 (six) hours as needed for severe pain (pain score 7-10) (sedation caution.). 11/13/23   Joaquim Nam, MD  polyethylene glycol powder (GLYCOLAX/MIRALAX) 17 GM/SCOOP powder Take 17 g by mouth daily as needed. 07/23/23   Joaquim Nam, MD  rosuvastatin (CRESTOR) 20 MG tablet TAKE 1 TABLET BY MOUTH EVERY DAY 11/08/23   Joaquim Nam, MD  spironolactone (ALDACTONE) 25 MG tablet Take 0.5 tablets (12.5 mg total) by  mouth daily. 09/19/23   Zannie Cove, MD  traZODone (DESYREL) 100 MG tablet Take 1-2 tablets (100-200 mg total) by mouth at bedtime. 11/13/23   Joaquim Nam, MD  XARELTO 20 MG TABS tablet TAKE 1 TABLET BY MOUTH DAILY WITH SUPPER. 05/14/23   Joaquim Nam, MD    Physical Exam: BP (!) 142/98   Pulse 95   Temp 97.9 F (36.6 C) (Oral)   Resp (!) 22   Wt 70 kg   SpO2 90%   BMI 22.79 kg/m   General:  Alert, oriented, calm, in no acute distress, resting comfortably currently on 4 L nasal cannula oxygen.  Speaking in full sentences, no cough. Cardiovascular: RRR, no murmurs or rubs, no peripheral edema  Respiratory: clear to auscultation bilaterally with reduced breath sounds on the right, no wheezes, no crackles  Abdomen: soft, nontender, nondistended, normal bowel tones heard  Skin: dry, no rashes  Musculoskeletal: no joint effusions, normal range of motion  Psychiatric: appropriate affect, normal speech  Neurologic: extraocular muscles intact, clear speech, moving all extremities with intact sensorium         Labs on Admission:  Basic Metabolic Panel: Recent Labs  Lab 11/30/23 1019  NA 137  K 4.2  CL 102  CO2 26  GLUCOSE 168*  BUN 27*  CREATININE 1.00  CALCIUM 8.6*   Liver Function Tests: No results for input(s): "AST", "ALT", "ALKPHOS", "BILITOT", "PROT", "ALBUMIN" in the last 168 hours. No results for input(s): "LIPASE", "AMYLASE" in the last 168 hours. No results for input(s): "AMMONIA" in the last 168 hours. CBC: Recent Labs  Lab 11/30/23 1019  WBC 12.9*  HGB 11.9*  HCT 35.4*  MCV 91.2  PLT 161   Cardiac Enzymes: No results for input(s): "CKTOTAL", "CKMB", "CKMBINDEX", "TROPONINI" in the last 168 hours.  BNP (last 3 results) Recent Labs    09/14/23 1148 11/30/23 1019  BNP 496.4* 475.3*    ProBNP (last 3 results) No results for input(s): "PROBNP" in the last 8760 hours.  CBG: No results for input(s): "GLUCAP" in the last 168  hours.  Radiological Exams on Admission: CT Chest Wo Contrast  Result Date: 11/30/2023 CLINICAL DATA:  Recurrent falls 2 days ago. Left arm and leg weakness for the past 3 days. Clinical  concern for pneumonia. EXAM: CT CHEST WITHOUT CONTRAST TECHNIQUE: Multidetector CT imaging of the chest was performed following the standard protocol without IV contrast. RADIATION DOSE REDUCTION: This exam was performed according to the departmental dose-optimization program which includes automated exposure control, adjustment of the mA and/or kV according to patient size and/or use of iterative reconstruction technique. COMPARISON:  Chest radiographs dated 10/31/2023. Chest CT dated 11/26/2020. FINDINGS: Cardiovascular: Atheromatous calcifications, including the coronary arteries and aorta. Enlarged heart. Minimal pericardial fluid. Mildly enlarged central pulmonary arteries with a main pulmonary artery diameter of 3.2 cm. Mediastinum/Nodes: Interval multiple enlarged mediastinal nodes with the largest in the right precarinal region with a short axis diameter of 20 mm on image number [id number]. No grossly enlarged hilar nodes, difficult to assess with the lack of intravenous contrast. No enlarged axillary, supraclavicular or upper abdominal nodes. Unremarkable thyroid gland, trachea and esophagus. Lungs/Pleura: Increased prominence of the pulmonary vasculature and interstitial markings with mild right basilar and minimal left basilar dependent atelectasis. The atelectasis on the right is more patchy and somewhat confluent, compatible with possible pneumonia. Minimal right pleural effusion. Moderate centrilobular bullous changes. Small left lower lobe calcified granuloma. Small amount of varicose bronchiectasis and associated parenchymal densities in the anterior left upper lobe. Upper Abdomen: Small amount of free peritoneal fluid. The liver contours are mildly irregular with hypertrophy of the lateral segment of the left lobe  and caudate lobe and a relatively small right lobe. The spleen remains lobulated and somewhat small. Musculoskeletal: Thoracic spine degenerative changes. IMPRESSION: 1. Mild congestive heart failure with a minimal right pleural effusion. 2. Mild right basilar and minimal left basilar dependent atelectasis. The atelectasis on the right is more patchy and somewhat confluent, suggesting possible pneumonia. 3. Interval mediastinal adenopathy, nonspecific. 4. Moderate centrilobular bullous emphysematous changes. 5. Small amount of varicose bronchiectasis and associated parenchymal densities in the anterior left upper lobe. 6. Mildly enlarged central pulmonary arteries, suggesting pulmonary arterial hypertension. 7. Small amount of free peritoneal fluid. 8. Suggesting cirrhosis of the liver. 9.  Calcific coronary artery and aortic atherosclerosis. Aortic Atherosclerosis (ICD10-I70.0) and Emphysema (ICD10-J43.9). Electronically Signed   By: Beckie Salts M.D.   On: 11/30/2023 12:11   CT Head Wo Contrast  Result Date: 11/30/2023 CLINICAL DATA:  Polytrauma, blunt.  Recurrent falls 2 days ago. EXAM: CT HEAD WITHOUT CONTRAST TECHNIQUE: Contiguous axial images were obtained from the base of the skull through the vertex without intravenous contrast. RADIATION DOSE REDUCTION: This exam was performed according to the departmental dose-optimization program which includes automated exposure control, adjustment of the mA and/or kV according to patient size and/or use of iterative reconstruction technique. COMPARISON:  Head CT 08/04/2009.  MRI brain 08/05/2009. FINDINGS: Brain: No acute hemorrhage. Cortical gray-white differentiation is preserved. Patchy hypoattenuation of the cerebral white matter, most consistent with mild chronic small-vessel disease. Prominence of the ventricles and sulci within normal limits for age. No extra-axial collection. Basilar cisterns are patent. Vascular: No hyperdense vessel or unexpected  calcification. Skull: No calvarial fracture or suspicious bone lesion. Skull base is unremarkable. Sinuses/Orbits: No acute finding. Other: None. IMPRESSION: 1. No acute intracranial abnormality. 2. Mild chronic small-vessel disease. Electronically Signed   By: Orvan Falconer M.D.   On: 11/30/2023 11:39    Assessment/Plan Nathan Fernandez is a 82 y.o. male with medical history significant for CAD, COPD on room air, non-insulin-dependent type 2 diabetes, history of DVT on Xarelto being admitted to the hospital with community-acquired pneumonia.  Abscess due to [facility] pneumonia-meeting  criteria with tachycardia, leukocytosis, source is community-acquired pneumonia.  He is hemodynamically stable. -Observation admission to MedSurg -Continue supplemental oxygen -Empiric IV azithromycin and IV Rocephin -Will give 1 L normal saline bolus x 1 now, due to tachycardia -Check stat lactate, and trend  Type 2 diabetes-well-controlled, not insulin-dependent -Carb controlled diet -Sliding scale insulin  Hypertension-continue home Aldactone, Imdur, Coreg  History of DVT-continue home Xarelto  Hyperlipidemia-Crestor  Neuropathy-gabapentin, Flexeril for spasms  Weakness-presumably due to his community-acquired pneumonia, will consult PT/OT    Code Status: Full Code  Consults called: None  Admission status: Observation  Time spent: 59 minutes  Jmichael Gille Sharlette Dense MD [facility] Pager [phone]  If 7PM-7AM, please contact night-coverage [email or web address] Password [email or web address]  11/30/2023, 5:29 PM

## 2023-11-30 NOTE — ED Notes (Signed)
RT called to room, patient SAT 86% on 2L. We sat patient up and increased him to 4L. Now 94%.

## 2023-11-30 NOTE — ED Notes (Signed)
[facility] in to transport to [employer]

## 2023-11-30 NOTE — ED Provider Notes (Signed)
[place] [facility] Provider Note   CSN: [id number] Arrival date & time: 11/30/23  1004     History  Chief Complaint  Patient presents with   Nathan Fernandez is a 82 y.o. male presented to ED with weakness and falls.  The patient is on Xarelto.  His wife reports he has had multiple falls over the past week due to weakness in his left arm and leg, no strength in his arms or picking himself up.  The patient has chronic cervical spine pain and weakness in his left arm, but his reported weakness in his left leg is a new symptom for the past several days.  The patient was seen in our emergency department exactly 1 month ago for left-sided shoulder pain rating to his left neck and left anterior chest.  He was prescribed Flexeril and oxycodone.  His wife reports that the PCP had increase his Flexeril dose from 5 mg to 10 mg, and also started him on a higher dose of gabapentin 300 mg, however they did discontinue the oxycodone.  He was concerned that the patient was having "hallucinations and not acting himself" on his new medications.  She feels that he has been getting weaker for the past 2 days, and he is having difficulty even getting up with his walker.  HPI     Home Medications Prior to Admission medications   Medication Sig Start Date End Date Taking? Authorizing Provider  albuterol (VENTOLIN HFA) 108 (90 Base) MCG/ACT inhaler Inhale 1-2 puffs into the lungs every 6 (six) hours as needed (COPD). 05/01/22   Joaquim Nam, MD  Ascorbic Acid (VITAMIN C PO) Take 1 tablet by mouth daily.    [provider]  B Complex CAPS Take 1 capsule by mouth daily.    [provider]  buPROPion (WELLBUTRIN SR) 150 MG 12 hr tablet Take 1 tablet (150 mg total) by mouth daily. 11/13/23   Joaquim Nam, MD  carvedilol (COREG) 6.25 MG tablet TAKE 1 TABLET BY MOUTH TWICE A DAY 11/08/23   Joaquim Nam, MD  Cholecalciferol (VITAMIN D-3 PO) Take  1 capsule by mouth daily.    [provider]  cyclobenzaprine (FLEXERIL) 5 MG tablet Take 1 tablet (5 mg total) by mouth 3 (three) times daily as needed for muscle spasms. 11/22/23   Joaquim Nam, MD  dorzolamide-timolol (COSOPT) 22.3-6.8 MG/ML ophthalmic solution Place 1 drop into both eyes 2 (two) times daily. 02/13/20   [provider]  ELDERBERRY PO Take 1 capsule by mouth in the morning and at bedtime.    [provider]  fenofibrate (TRICOR) 48 MG tablet TAKE 1 TABLET BY MOUTH EVERY DAY 11/10/23   Joaquim Nam, MD  furosemide (LASIX) 20 MG tablet Take 20 mg by mouth as needed for fluid or edema.    [provider]  gabapentin (NEURONTIN) 300 MG capsule Take 1 capsule (300 mg total) by mouth 3 (three) times daily. 11/13/23   Joaquim Nam, MD  hydrALAZINE (APRESOLINE) 10 MG tablet TAKE 1 TABLET BY MOUTH EVERY 12 HOURS. 07/22/23   Wendall Stade, MD  isosorbide mononitrate (IMDUR) 30 MG 24 hr tablet TAKE 1/2 TABLET BY MOUTH DAILY 07/22/23   Wendall Stade, MD  ketoconazole (NIZORAL) 2 % cream APPLY 1 APPLICATION TOPICALLY DAILY AS NEEDED FOR IRRITATION. 03/05/23   Joaquim Nam, MD  lidocaine (XYLOCAINE) 5 % ointment Apply 1 Application topically  as needed. 10/31/23   Glyn Ade, MD  nitroGLYCERIN (NITROSTAT) 0.4 MG SL tablet Place 1 tablet (0.4 mg total) under the tongue every 5 (five) minutes as needed for chest pain. Up to 3 doses 07/22/23   Swinyer, Zachary George, NP  oxyCODONE-acetaminophen (PERCOCET/ROXICET) 5-325 MG tablet Take 0.5-1 tablets by mouth every 6 (six) hours as needed for severe pain (pain score 7-10) (sedation caution.). 11/13/23   Joaquim Nam, MD  polyethylene glycol powder (GLYCOLAX/MIRALAX) 17 GM/SCOOP powder Take 17 g by mouth daily as needed. 07/23/23   Joaquim Nam, MD  rosuvastatin (CRESTOR) 20 MG tablet TAKE 1 TABLET BY MOUTH EVERY DAY 11/08/23   Joaquim Nam, MD  spironolactone (ALDACTONE) 25 MG tablet Take  0.5 tablets (12.5 mg total) by mouth daily. 09/19/23   Zannie Cove, MD  traZODone (DESYREL) 100 MG tablet Take 1-2 tablets (100-200 mg total) by mouth at bedtime. 11/13/23   Joaquim Nam, MD  XARELTO 20 MG TABS tablet TAKE 1 TABLET BY MOUTH DAILY WITH SUPPER. 05/14/23   Joaquim Nam, MD      Allergies    Ace inhibitors, Angiotensin receptor blockers, Vasotec [enalapril], and Pravachol [pravastatin]    Review of Systems   Review of Systems  Physical Exam Updated Vital Signs BP (!) 150/94   Pulse 88   Temp 97.8 F (36.6 C)   Resp 19   Wt 70 kg   SpO2 94%   BMI 22.79 kg/m  Physical Exam Constitutional:      General: He is not in acute distress. HENT:     Head: Normocephalic and atraumatic.  Eyes:     Conjunctiva/sclera: Conjunctivae normal.     Pupils: Pupils are equal, round, and reactive to light.  Cardiovascular:     Rate and Rhythm: Normal rate and regular rhythm.  Pulmonary:     Effort: Pulmonary effort is normal. No respiratory distress.     Comments: 83% on room air, 94% on 4L [place] Abdominal:     General: There is no distension.     Tenderness: There is no abdominal tenderness.  Skin:    General: Skin is warm and dry.  Neurological:     Mental Status: He is alert.     Comments: Isolated motor strength testing of the upper and lower extremities are intact, 5/5 Reports paresthesias in left arm No cranial nerve deficits     ED Results / Procedures / Treatments   Labs (all labs ordered are listed, but only abnormal results are displayed) Labs Reviewed  BASIC METABOLIC PANEL - Abnormal; Notable for the following components:      Result Value   Glucose, Bld 168 (*)    BUN 27 (*)    Calcium 8.6 (*)    All other components within normal limits  CBC - Abnormal; Notable for the following components:   WBC 12.9 (*)    RBC 3.88 (*)    Hemoglobin 11.9 (*)    HCT 35.4 (*)    All other components within normal limits  BRAIN NATRIURETIC PEPTIDE - Abnormal;  Notable for the following components:   B Natriuretic Peptide 475.3 (*)    All other components within normal limits  TROPONIN I (HIGH SENSITIVITY) - Abnormal; Notable for the following components:   Troponin I (High Sensitivity) 57 (*)    All other components within normal limits  TROPONIN I (HIGH SENSITIVITY) - Abnormal; Notable for the following components:   Troponin I (High Sensitivity) 62 (*)  All other components within normal limits  RESP PANEL BY RT-PCR (RSV, FLU A&B, COVID)  [id number]  URINALYSIS, ROUTINE W REFLEX MICROSCOPIC    EKG EKG Interpretation Date/Time:  Monday November 30 2023 10:18:08 EST Ventricular Rate:  90 PR Interval:  225 QRS Duration:  133 QT Interval:  428 QTC Calculation: 524 R Axis:   -69  Text Interpretation: Sinus rhythm Prolonged PR interval Right bundle branch block Confirmed by Alvester Chou [phone]) on 11/30/2023 10:20:00 AM  Radiology CT Head Wo Contrast  Result Date: 11/30/2023 CLINICAL DATA:  Polytrauma, blunt.  Recurrent falls 2 days ago. EXAM: CT HEAD WITHOUT CONTRAST TECHNIQUE: Contiguous axial images were obtained from the base of the skull through the vertex without intravenous contrast. RADIATION DOSE REDUCTION: This exam was performed according to the departmental dose-optimization program which includes automated exposure control, adjustment of the mA and/or kV according to patient size and/or use of iterative reconstruction technique. COMPARISON:  Head CT 08/04/2009.  MRI brain 08/05/2009. FINDINGS: Brain: No acute hemorrhage. Cortical gray-white differentiation is preserved. Patchy hypoattenuation of the cerebral white matter, most consistent with mild chronic small-vessel disease. Prominence of the ventricles and sulci within normal limits for age. No extra-axial collection. Basilar cisterns are patent. Vascular: No hyperdense vessel or unexpected calcification. Skull: No calvarial fracture or suspicious bone lesion. Skull base is  unremarkable. Sinuses/Orbits: No acute finding. Other: None. IMPRESSION: 1. No acute intracranial abnormality. 2. Mild chronic small-vessel disease. Electronically Signed   By: Orvan Falconer M.D.   On: 11/30/2023 11:39    Procedures Procedures    Medications Ordered in ED Medications  cefTRIAXone (ROCEPHIN) 1 g in sodium chloride 0.9 % 100 mL IVPB (1 g Intravenous New Bag/Given 11/30/23 1350)  doxycycline (VIBRA-TABS) tablet 100 mg (100 mg Oral Given 11/30/23 1346)    ED Course/ Medical Decision Making/ A&P Clinical Course as of 11/30/23 1448  Mon Nov 30, 2023  1328 Admitted to hospitalist [MT]    Clinical Course User Index [MT] Terald Sleeper, MD                                 Medical Decision Making Amount and/or Complexity of Data Reviewed Labs: ordered. Radiology: ordered.  Risk Prescription drug management. Decision regarding hospitalization.   This patient presents to the ED with concern for generalized weakness, more so on the left side of the body, with recurring falls,. This involves an extensive number of treatment options, and is a complaint that carries with it a high risk of complications and morbidity.  The differential diagnosis includes polypharmacy (given mixture of gabapentin, flexeril and recent opioids) causing muscular weakness vs atypical ACS vs infection (UTI, PNA) vs other  Reported head injury on xarelto, will need CT head Hypoxia on arrival 83% on room air - CT chest ordered for PNA/aspiration evaluation  Co-morbidities that complicate the patient evaluation: xarelto use at risk for bleeding  Additional history obtained from patient's wife   I ordered and personally interpreted labs.  The pertinent results include: Mild chronic elevation of troponins are flat on repeat.  BMP unremarkable.  Very mild leukocytosis white blood cell count 12.9.  COVID is negative.  BNP near baseline levels  I ordered imaging studies including CT head, CT chest I  independently visualized and interpreted imaging which showed likely infiltrative lower lung I agree with the radiologist interpretation  The patient was maintained on a cardiac monitor.  I personally  viewed and interpreted the cardiac monitored which showed an underlying rhythm of: Sinus rhythm and sinus tachycardia  Per my interpretation the patient's ECG shows borderline sinus tachycardia, no evident ischemic findings  I ordered medication including IV Rocephin and doxycycline for community pneumonia  I have reviewed the patients home medicines and have made adjustments as needed  Test Considered: Low suspicion for acute PE in this clinical setting  Doubt acute stroke or spinal cord lesion  After the interventions noted above, I reevaluated the patient and found that they have: stayed the same  Dispostion:  After consideration of the diagnostic results and the patients response to treatment, I feel that the patent would benefit from medical admission for pneumonia with hypoxia.  I suspect the generalized weakness is related to acute infection.        Final Clinical Impression(s) / ED Diagnoses Final diagnoses:  Pneumonia due to infectious organism, unspecified laterality, unspecified part of lung    Rx / DC Orders ED Discharge Orders     None         Presly Steinruck, Kermit Balo, MD 11/30/23 [phone]

## 2023-11-30 NOTE — ED Notes (Signed)
Consult for hospitalist called to [facility], spoke with Maisie Fus

## 2023-12-01 DIAGNOSIS — D649 Anemia, unspecified: Secondary | ICD-10-CM | POA: Diagnosis not present

## 2023-12-01 DIAGNOSIS — A419 Sepsis, unspecified organism: Secondary | ICD-10-CM | POA: Diagnosis present

## 2023-12-01 DIAGNOSIS — I2699 Other pulmonary embolism without acute cor pulmonale: Secondary | ICD-10-CM | POA: Diagnosis not present

## 2023-12-01 DIAGNOSIS — J90 Pleural effusion, not elsewhere classified: Secondary | ICD-10-CM | POA: Diagnosis not present

## 2023-12-01 DIAGNOSIS — Z79899 Other long term (current) drug therapy: Secondary | ICD-10-CM | POA: Diagnosis not present

## 2023-12-01 DIAGNOSIS — I5033 Acute on chronic diastolic (congestive) heart failure: Secondary | ICD-10-CM | POA: Diagnosis not present

## 2023-12-01 DIAGNOSIS — Z87891 Personal history of nicotine dependence: Secondary | ICD-10-CM | POA: Diagnosis not present

## 2023-12-01 DIAGNOSIS — N179 Acute kidney failure, unspecified: Secondary | ICD-10-CM | POA: Diagnosis present

## 2023-12-01 DIAGNOSIS — Z9911 Dependence on respirator [ventilator] status: Secondary | ICD-10-CM | POA: Diagnosis not present

## 2023-12-01 DIAGNOSIS — K922 Gastrointestinal hemorrhage, unspecified: Secondary | ICD-10-CM | POA: Diagnosis not present

## 2023-12-01 DIAGNOSIS — I700 Atherosclerosis of aorta: Secondary | ICD-10-CM | POA: Diagnosis not present

## 2023-12-01 DIAGNOSIS — I110 Hypertensive heart disease with heart failure: Secondary | ICD-10-CM | POA: Diagnosis present

## 2023-12-01 DIAGNOSIS — R0602 Shortness of breath: Secondary | ICD-10-CM | POA: Diagnosis not present

## 2023-12-01 DIAGNOSIS — I2609 Other pulmonary embolism with acute cor pulmonale: Secondary | ICD-10-CM | POA: Diagnosis not present

## 2023-12-01 DIAGNOSIS — E8809 Other disorders of plasma-protein metabolism, not elsewhere classified: Secondary | ICD-10-CM | POA: Diagnosis not present

## 2023-12-01 DIAGNOSIS — D62 Acute posthemorrhagic anemia: Secondary | ICD-10-CM | POA: Diagnosis not present

## 2023-12-01 DIAGNOSIS — I5043 Acute on chronic combined systolic (congestive) and diastolic (congestive) heart failure: Secondary | ICD-10-CM | POA: Diagnosis not present

## 2023-12-01 DIAGNOSIS — Z86718 Personal history of other venous thrombosis and embolism: Secondary | ICD-10-CM | POA: Diagnosis not present

## 2023-12-01 DIAGNOSIS — G629 Polyneuropathy, unspecified: Secondary | ICD-10-CM | POA: Diagnosis not present

## 2023-12-01 DIAGNOSIS — R918 Other nonspecific abnormal finding of lung field: Secondary | ICD-10-CM | POA: Diagnosis not present

## 2023-12-01 DIAGNOSIS — Z86711 Personal history of pulmonary embolism: Secondary | ICD-10-CM | POA: Diagnosis not present

## 2023-12-01 DIAGNOSIS — R6520 Severe sepsis without septic shock: Secondary | ICD-10-CM | POA: Diagnosis present

## 2023-12-01 DIAGNOSIS — D7389 Other diseases of spleen: Secondary | ICD-10-CM | POA: Diagnosis not present

## 2023-12-01 DIAGNOSIS — R1900 Intra-abdominal and pelvic swelling, mass and lump, unspecified site: Secondary | ICD-10-CM | POA: Diagnosis not present

## 2023-12-01 DIAGNOSIS — Z85828 Personal history of other malignant neoplasm of skin: Secondary | ICD-10-CM | POA: Diagnosis not present

## 2023-12-01 DIAGNOSIS — J9601 Acute respiratory failure with hypoxia: Secondary | ICD-10-CM | POA: Diagnosis not present

## 2023-12-01 DIAGNOSIS — Z888 Allergy status to other drugs, medicaments and biological substances status: Secondary | ICD-10-CM | POA: Diagnosis not present

## 2023-12-01 DIAGNOSIS — K921 Melena: Secondary | ICD-10-CM | POA: Diagnosis present

## 2023-12-01 DIAGNOSIS — Z7401 Bed confinement status: Secondary | ICD-10-CM | POA: Diagnosis not present

## 2023-12-01 DIAGNOSIS — R079 Chest pain, unspecified: Secondary | ICD-10-CM | POA: Diagnosis not present

## 2023-12-01 DIAGNOSIS — J440 Chronic obstructive pulmonary disease with acute lower respiratory infection: Secondary | ICD-10-CM | POA: Diagnosis present

## 2023-12-01 DIAGNOSIS — Z743 Need for continuous supervision: Secondary | ICD-10-CM | POA: Diagnosis not present

## 2023-12-01 DIAGNOSIS — J811 Chronic pulmonary edema: Secondary | ICD-10-CM | POA: Diagnosis not present

## 2023-12-01 DIAGNOSIS — K649 Unspecified hemorrhoids: Secondary | ICD-10-CM | POA: Diagnosis not present

## 2023-12-01 DIAGNOSIS — C259 Malignant neoplasm of pancreas, unspecified: Secondary | ICD-10-CM | POA: Diagnosis not present

## 2023-12-01 DIAGNOSIS — M792 Neuralgia and neuritis, unspecified: Secondary | ICD-10-CM | POA: Diagnosis not present

## 2023-12-01 DIAGNOSIS — R64 Cachexia: Secondary | ICD-10-CM | POA: Diagnosis not present

## 2023-12-01 DIAGNOSIS — I517 Cardiomegaly: Secondary | ICD-10-CM | POA: Diagnosis not present

## 2023-12-01 DIAGNOSIS — J189 Pneumonia, unspecified organism: Secondary | ICD-10-CM | POA: Diagnosis present

## 2023-12-01 DIAGNOSIS — I2694 Multiple subsegmental pulmonary emboli without acute cor pulmonale: Secondary | ICD-10-CM | POA: Diagnosis not present

## 2023-12-01 DIAGNOSIS — R310 Gross hematuria: Secondary | ICD-10-CM | POA: Diagnosis not present

## 2023-12-01 DIAGNOSIS — Z7901 Long term (current) use of anticoagulants: Secondary | ICD-10-CM | POA: Diagnosis not present

## 2023-12-01 DIAGNOSIS — R296 Repeated falls: Secondary | ICD-10-CM | POA: Diagnosis present

## 2023-12-01 DIAGNOSIS — I723 Aneurysm of iliac artery: Secondary | ICD-10-CM | POA: Diagnosis not present

## 2023-12-01 DIAGNOSIS — R112 Nausea with vomiting, unspecified: Secondary | ICD-10-CM | POA: Diagnosis not present

## 2023-12-01 DIAGNOSIS — I252 Old myocardial infarction: Secondary | ICD-10-CM | POA: Diagnosis not present

## 2023-12-01 DIAGNOSIS — C786 Secondary malignant neoplasm of retroperitoneum and peritoneum: Secondary | ICD-10-CM | POA: Diagnosis not present

## 2023-12-01 DIAGNOSIS — R188 Other ascites: Secondary | ICD-10-CM | POA: Diagnosis not present

## 2023-12-01 DIAGNOSIS — C787 Secondary malignant neoplasm of liver and intrahepatic bile duct: Secondary | ICD-10-CM | POA: Diagnosis not present

## 2023-12-01 DIAGNOSIS — E1140 Type 2 diabetes mellitus with diabetic neuropathy, unspecified: Secondary | ICD-10-CM | POA: Diagnosis present

## 2023-12-01 DIAGNOSIS — I10 Essential (primary) hypertension: Secondary | ICD-10-CM | POA: Diagnosis not present

## 2023-12-01 DIAGNOSIS — R58 Hemorrhage, not elsewhere classified: Secondary | ICD-10-CM | POA: Diagnosis not present

## 2023-12-01 DIAGNOSIS — K5900 Constipation, unspecified: Secondary | ICD-10-CM | POA: Diagnosis not present

## 2023-12-01 DIAGNOSIS — R319 Hematuria, unspecified: Secondary | ICD-10-CM | POA: Diagnosis not present

## 2023-12-01 DIAGNOSIS — J439 Emphysema, unspecified: Secondary | ICD-10-CM | POA: Diagnosis not present

## 2023-12-01 DIAGNOSIS — E119 Type 2 diabetes mellitus without complications: Secondary | ICD-10-CM | POA: Diagnosis not present

## 2023-12-01 DIAGNOSIS — K5909 Other constipation: Secondary | ICD-10-CM | POA: Diagnosis not present

## 2023-12-01 DIAGNOSIS — R590 Localized enlarged lymph nodes: Secondary | ICD-10-CM | POA: Diagnosis not present

## 2023-12-01 DIAGNOSIS — C482 Malignant neoplasm of peritoneum, unspecified: Secondary | ICD-10-CM | POA: Diagnosis not present

## 2023-12-01 DIAGNOSIS — R609 Edema, unspecified: Secondary | ICD-10-CM | POA: Diagnosis not present

## 2023-12-01 DIAGNOSIS — Z7902 Long term (current) use of antithrombotics/antiplatelets: Secondary | ICD-10-CM | POA: Diagnosis not present

## 2023-12-01 DIAGNOSIS — I2510 Atherosclerotic heart disease of native coronary artery without angina pectoris: Secondary | ICD-10-CM | POA: Diagnosis not present

## 2023-12-01 DIAGNOSIS — D72829 Elevated white blood cell count, unspecified: Secondary | ICD-10-CM | POA: Diagnosis not present

## 2023-12-01 DIAGNOSIS — J449 Chronic obstructive pulmonary disease, unspecified: Secondary | ICD-10-CM | POA: Diagnosis not present

## 2023-12-01 DIAGNOSIS — K625 Hemorrhage of anus and rectum: Secondary | ICD-10-CM | POA: Diagnosis not present

## 2023-12-01 DIAGNOSIS — L89152 Pressure ulcer of sacral region, stage 2: Secondary | ICD-10-CM | POA: Diagnosis not present

## 2023-12-01 DIAGNOSIS — E785 Hyperlipidemia, unspecified: Secondary | ICD-10-CM | POA: Diagnosis present

## 2023-12-01 LAB — CBC
HCT: 31.1 % — ABNORMAL LOW (ref 39.0–52.0)
Hemoglobin: 10.2 g/dL — ABNORMAL LOW (ref 13.0–17.0)
MCH: 31 pg (ref 26.0–34.0)
MCHC: 32.8 g/dL (ref 30.0–36.0)
MCV: 94.5 fL (ref 80.0–100.0)
Platelets: 143 10*3/uL — ABNORMAL LOW (ref 150–400)
RBC: 3.29 MIL/uL — ABNORMAL LOW (ref 4.22–5.81)
RDW: 14.6 % (ref 11.5–15.5)
WBC: 9.8 10*3/uL (ref 4.0–10.5)
nRBC: 0 % (ref 0.0–0.2)

## 2023-12-01 LAB — BASIC METABOLIC PANEL
Anion gap: 9 (ref 5–15)
BUN: 23 mg/dL (ref 8–23)
CO2: 22 mmol/L (ref 22–32)
Calcium: 8.1 mg/dL — ABNORMAL LOW (ref 8.9–10.3)
Chloride: 106 mmol/L (ref 98–111)
Creatinine, Ser: 0.83 mg/dL (ref 0.61–1.24)
GFR, Estimated: >60 mL/min (ref >60)
Glucose, Bld: 136 mg/dL — ABNORMAL HIGH (ref 70–99)
Potassium: 3.9 mmol/L (ref 3.5–5.1)
Sodium: 137 mmol/L (ref 135–145)

## 2023-12-01 LAB — GLUCOSE, CAPILLARY
Glucose-Capillary: 144 mg/dL — ABNORMAL HIGH (ref 70–99)
Glucose-Capillary: 136 mg/dL — ABNORMAL HIGH (ref 70–99)
Glucose-Capillary: 119 mg/dL — ABNORMAL HIGH (ref 70–99)
Glucose-Capillary: 151 mg/dL — ABNORMAL HIGH (ref 70–99)

## 2023-12-01 MED ORDER — FUROSEMIDE 10 MG/ML IJ SOLN
40.0000 mg | Freq: Once | INTRAMUSCULAR | Status: AC
  Administered 2023-12-01: 40 mg via INTRAVENOUS
  Filled 2023-12-01: qty 4

## 2023-12-01 NOTE — TOC Initial Note (Signed)
Transition of Care [facility]) - Initial/Assessment Note    Patient Details  Name: Nathan Fernandez MRN: [id number] Date of Birth: Feb 11, 1941  Transition of [facility]) CM/SW Contact:    Otelia Santee, LCSW Phone Number: 12/01/2023, 2:43 PM  Clinical Narrative:                 Pt from home with spouse. Met with pt and confirmed recommendation for SNF placement. Pt's spouse would like pt to go to [facility] for rehab if possible.  Referrals have been sent out for SNF placement and currently awaiting bed offers.   Expected Discharge Plan: Skilled Nursing Facility Barriers to Discharge: Continued Medical Work up, SNF Pending bed offer   Patient Goals and CMS Choice Patient states their goals for this hospitalization and ongoing recovery are:: For pt to go to SNF CMS Medicare.gov Compare Post Acute Care list provided to:: Patient Choice offered to / list presented to : Patient [place] ownership interest in [facility].provided to:: Patient    Expected Discharge Plan and Services In-house Referral: Clinical Social Work Discharge Planning Services: NA Post Acute Care Choice: Skilled Nursing Facility Living arrangements for the past 2 months: Single Family Home                 DME Arranged: N/A DME Agency: NA                  Prior Living Arrangements/Services Living arrangements for the past 2 months: Single Family Home Lives with:: Spouse Patient language and need for interpreter reviewed:: Yes Do you feel safe going back to the place where you live?: Yes      Need for Family Participation in Patient Care: No (Comment) Care giver support system in place?: No (comment) Current home services: DME (Home O2, cane, walker) Criminal Activity/Legal Involvement Pertinent to Current Situation/Hospitalization: No - Comment as needed  Activities of Daily Living      Permission Sought/Granted Permission sought to share information with : Facility Medical sales representative,  Family Supports Permission granted to share information with : Yes, Verbal Permission Granted  Share Information with NAME: Clifford Westmoreland  Permission granted to share info w AGENCY: SNF's  Permission granted to share info w Relationship: Spouse     Emotional Assessment Appearance:: Appears stated age Attitude/Demeanor/Rapport: Engaged Affect (typically observed): Accepting Orientation: : Oriented to Self, Oriented to Place, Oriented to  Time, Oriented to Situation Alcohol / Substance Use: Not Applicable Psych Involvement: No (comment)  Admission diagnosis:  CAP (community acquired pneumonia) [J18.9] Pneumonia due to infectious organism, unspecified laterality, unspecified part of lung [J18.9] Patient Active Problem List   Diagnosis Date Noted   CAP (community acquired pneumonia) 11/30/2023   Neck pain 11/15/2023   Heart failure (HCC) 09/14/2023   Acute on chronic diastolic CHF (congestive heart failure) (HCC) 09/14/2023   VTE (venous thromboembolism) 09/14/2023   Acute hypoxic respiratory failure (HCC) 09/14/2023   Disorder of foreskin 08/05/2023   Left sided abdominal pain 08/05/2023   Foreskin problem 03/11/2023   Inguinal pain 05/04/2022   Leg swelling 05/04/2022   DVT (deep venous thrombosis) (HCC) 12/2021   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/19/2021   Non-ST elevation MI (NSTEMI) (HCC) 02/12/2021   NSTEMI (non-ST elevated myocardial infarction) (HCC) 02/12/2021   Long term (current) use of anticoagulants 05/27/2020   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   Hemoptysis 07/10/2019   Abnormal cardiovascular stress test    Tremor 09/02/2018  Advance care planning 09/02/2018   Cold intolerance 09/02/2018   Dry mouth 03/12/2018   Shoulder pain 01/05/2018   Hypoxia 12/29/2017   Wrist pain 12/29/2017   Lower back pain 02/18/2017   Lower urinary tract symptoms (LUTS) 02/11/2016   Bilateral knee pain 05/25/2015   Benign paroxysmal  positional vertigo 02/18/2015   Abdominal wall symptom 02/18/2015   Gout 05/04/2014   Healthcare maintenance 04/18/2014   COPD (chronic obstructive pulmonary disease) (HCC) 03/25/2013   CAD (coronary artery disease) 03/03/2013   Other malaise and fatigue 02/22/2013   Chest wall pain 05/31/2012   Heartburn 05/07/2011   Depression 11/28/2010   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 [place] [address] Phone: [phone] Fax: [phone]     Social Determinants of Health (SDOH) Social History: SDOH Screenings   Food Insecurity: No Food Insecurity (11/30/2023)  Housing: Patient Declined (11/30/2023)  Transportation Needs: No Transportation Needs (11/30/2023)  Utilities: Not At Risk (11/30/2023)  Alcohol Screen: Low Risk  (11/24/2023)  Depression (PHQ2-9): Low Risk  (11/24/2023)  Financial Resource Strain: Low Risk  (11/24/2023)  Physical Activity: Inactive (11/24/2023)  Social Connections: Socially Integrated (11/24/2023)  Stress: Stress Concern Present (11/24/2023)  Tobacco Use: Medium Risk (11/30/2023)  Health Literacy: Adequate Health Literacy (11/24/2023)   SDOH Interventions:     Readmission Risk Interventions     No data to display

## 2023-12-01 NOTE — Plan of Care (Signed)

## 2023-12-01 NOTE — NC FL2 (Signed)
[place] MEDICAID FL2 LEVEL OF CARE FORM     IDENTIFICATION  Patient Name: Nathan Fernandez Birthdate: [birth year] Sex: male Admission Date (Current Location): 11/30/2023  [facility] and [place] Number:  Producer, television/film/video and Address:  [facility],  [address] [place], [place] [address]      Provider Number: [id number]  Attending Physician Name and Address:  Tyrone Nine, MD  Relative Name and Phone Number:  Devanta, Juby [phone]    Current Level of Care: Hospital Recommended Level of Care: Skilled Nursing Facility Prior Approval Number:    Date Approved/Denied:   PASRR Number: [id number]  Discharge Plan: SNF    Current Diagnoses: Patient Active Problem List   Diagnosis Date Noted   CAP (community acquired pneumonia) 11/30/2023   Neck pain 11/15/2023   Heart failure (HCC) 09/14/2023   Acute on chronic diastolic CHF (congestive heart failure) (HCC) 09/14/2023   VTE (venous thromboembolism) 09/14/2023   Acute hypoxic respiratory failure (HCC) 09/14/2023   Disorder of foreskin 08/05/2023   Left sided abdominal pain 08/05/2023   Foreskin problem 03/11/2023   Inguinal pain 05/04/2022   Leg swelling 05/04/2022   DVT (deep venous thrombosis) (HCC) 12/2021   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/19/2021   Non-ST elevation MI (NSTEMI) (HCC) 02/12/2021   NSTEMI (non-ST elevated myocardial infarction) (HCC) 02/12/2021   Long term (current) use of anticoagulants 05/27/2020   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   Hemoptysis 07/10/2019   Abnormal cardiovascular stress test    Tremor 09/02/2018   Advance care planning 09/02/2018   Cold intolerance 09/02/2018   Dry mouth 03/12/2018   Shoulder pain 01/05/2018   Hypoxia 12/29/2017   Wrist pain 12/29/2017   Lower back pain 02/18/2017   Lower urinary tract symptoms (LUTS) 02/11/2016   Bilateral knee pain 05/25/2015   Benign paroxysmal positional vertigo 02/18/2015    Abdominal wall symptom 02/18/2015   Gout 05/04/2014   Healthcare maintenance 04/18/2014   COPD (chronic obstructive pulmonary disease) (HCC) 03/25/2013   CAD (coronary artery disease) 03/03/2013   Other malaise and fatigue 02/22/2013   Chest wall pain 05/31/2012   Heartburn 05/07/2011   Depression 11/28/2010   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   NICOTINE ADDICTION 06/04/2010   Type 2 diabetes mellitus with hyperlipidemia (HCC) 03/18/2010   SCIATICA, 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   HYPERCHOLESTEROLEMIA 10/19/2007   ERECTILE DYSFUNCTION 10/19/2007   Essential hypertension 10/19/2007   ROSACEA 10/19/2007   Osteoarthritis 10/19/2007   Insomnia 10/19/2007    Orientation RESPIRATION BLADDER Height & Weight     Self, Time, Situation, Place  O2 (5L) Continent Weight: 154 lb 5.2 oz (70 kg) Height:     BEHAVIORAL SYMPTOMS/MOOD NEUROLOGICAL BOWEL NUTRITION STATUS      Continent Diet (See discharge summary)  AMBULATORY STATUS COMMUNICATION OF NEEDS Skin   Limited Assist Verbally Normal                       Personal Care Assistance Level of Assistance  Bathing, Feeding, Dressing Bathing Assistance: Limited assistance Feeding assistance: Independent Dressing Assistance: Limited assistance     Functional Limitations Info  Sight, Hearing, Speech Sight Info: Adequate Hearing Info: Impaired Speech Info: Adequate    SPECIAL CARE FACTORS FREQUENCY  PT (By licensed PT), OT (By licensed OT)     PT Frequency: 5x/wk OT Frequency: 5x/wk            Contractures Contractures Info: Not  present    Additional Factors Info  Code Status, Allergies Code Status Info: FULL Allergies Info: Ace Inhibitors, Angiotensin Receptor Blockers, Vasotec (Enalapril), Pravachol (Pravastatin)           Current Medications (12/01/2023):  This is the current hospital active medication list Current Facility-Administered Medications  Medication Dose Route Frequency Provider Last  Rate Last Admin   acetaminophen (TYLENOL) tablet 650 mg  650 mg Oral Q6H PRN Kirby Crigler, Mir M, MD       Or   acetaminophen (TYLENOL) suppository 650 mg  650 mg Rectal Q6H PRN Kirby Crigler, Mir M, MD       albuterol (PROVENTIL) (2.5 MG/3ML) 0.083% nebulizer solution 2.5 mg  2.5 mg Nebulization Q2H PRN Kirby Crigler, Mir M, MD       azithromycin (ZITHROMAX) 500 mg in sodium chloride 0.9 % 250 mL IVPB  500 mg Intravenous Q24H Kirby Crigler, Mir M, MD 250 mL/hr at 12/01/23 1037 500 mg at 12/01/23 1037   buPROPion (WELLBUTRIN SR) 12 hr tablet 150 mg  150 mg Oral Daily Kirby Crigler, Mir M, MD   150 mg at 12/01/23 0933   carvedilol (COREG) tablet 6.25 mg  6.25 mg Oral BID Kirby Crigler, Mir M, MD   6.25 mg at 12/01/23 0931   cefTRIAXone (ROCEPHIN) 1 g in sodium chloride 0.9 % 100 mL IVPB  1 g Intravenous Q24H Kirby Crigler, Mir M, MD   Stopped at 12/01/23 1034   cyclobenzaprine (FLEXERIL) tablet 5 mg  5 mg Oral TID PRN Kirby Crigler, Mir M, MD       fenofibrate tablet 54 mg  54 mg Oral Daily Kirby Crigler, Mir M, MD   54 mg at 12/01/23 0935   gabapentin (NEURONTIN) capsule 300 mg  300 mg Oral TID Kirby Crigler, Mir M, MD   300 mg at 12/01/23 8469   hydrALAZINE (APRESOLINE) tablet 10 mg  10 mg Oral Q12H Kirby Crigler, Mir M, MD   10 mg at 12/01/23 0940   insulin aspart (novoLOG) injection 0-15 Units  0-15 Units Subcutaneous TID WC Kirby Crigler, Mir M, MD   2 Units at 12/01/23 1301   insulin aspart (novoLOG) injection 0-5 Units  0-5 Units Subcutaneous QHS Kirby Crigler, Mir M, MD       isosorbide mononitrate (IMDUR) 24 hr tablet 15 mg  15 mg Oral Daily Kirby Crigler, Mir M, MD   15 mg at 12/01/23 0931   ondansetron (ZOFRAN) tablet 4 mg  4 mg Oral Q6H PRN Kirby Crigler, Mir M, MD       Or   ondansetron [facility]) injection 4 mg  4 mg Intravenous Q6H PRN Kirby Crigler, Mir M, MD       polyethylene glycol (MIRALAX / GLYCOLAX) packet 17 g  17 g Oral Daily PRN Kirby Crigler, Mir M, MD       rivaroxaban Carlena Hurl) tablet 20 mg  20 mg Oral Q supper  Kirby Crigler, Mir M, MD   20 mg at 11/30/23 2246   rosuvastatin (CRESTOR) tablet 20 mg  20 mg Oral Daily Kirby Crigler, Mir M, MD   20 mg at 12/01/23 0932   spironolactone (ALDACTONE) tablet 12.5 mg  12.5 mg Oral Daily Kirby Crigler, Mir M, MD   12.5 mg at 12/01/23 0936   traZODone (DESYREL) tablet 50 mg  50 mg Oral QHS PRN Maryln Gottron, MD         Discharge Medications: Please see discharge summary for a list of discharge medications.  Relevant Imaging Results:  Relevant Lab Results:   Additional Information SSN: [id number]  Otelia Santee, LCSW

## 2023-12-01 NOTE — Evaluation (Signed)
Physical Therapy Evaluation Patient Details Name: Nathan Fernandez MRN: [id number] DOB: 20-Jul-1941 Today's Date: 12/01/2023  History of Present Illness  Pt is an 82 y.o. male admitted to the hospital with community-acquired pneumonia, weakness, and falls. Past medical history significant for CAD, COPD on room air, non-insulin-dependent type 2 diabetes, history of DVT on Xarelto, MI, OA, CVA, and lumbar laminectomy/decompression microdiscectomy.   Clinical Impression  Pt is an 82 y.o. male with above HPI resulting in the deficits listed below (see PT Problem List). At typical baseline pt lives with his wife. Pt is independent with bathing/dressing, mobility- recently started using cane ~1wk ago, has had multiple falls. Pt currently requires up to MIN A for ambulation with use of RW and with supplemental O2 requirements. Pt with O2 desat during mobility as low as 85% while on 6L [place]- no O2 needs at baseline per pt and spouse. Pt will benefit from continued skilled PT services during hospital stay with follow up in post acute inpatient rehab <3hrs/day upon d/c to  maximize functional mobility to increase independence.          If plan is discharge home, recommend the following: A little help with walking and/or transfers;A little help with bathing/dressing/bathroom;Help with stairs or ramp for entrance;Assistance with cooking/housework;Assist for transportation   Can travel by private vehicle   Yes    Equipment Recommendations Rolling walker (2 wheels)  Recommendations for Other Services       Functional Status Assessment Patient has had a recent decline in their functional status and demonstrates the ability to make significant improvements in function in a reasonable and predictable amount of time.     Precautions / Restrictions Precautions Precautions: Fall Restrictions Weight Bearing Restrictions: No      Mobility  Bed Mobility Overal bed mobility: Needs Assistance Bed Mobility:  Supine to Sit     Supine to sit: Supervision, HOB elevated     General bed mobility comments: no use of bed rails, HOB slightly elevated. Incr time sitting EOB for recovery due to fatigue.    Transfers Overall transfer level: Needs assistance Equipment used: None, Rolling walker (2 wheels) Transfers: Sit to/from Stand, Bed to chair/wheelchair/BSC Sit to Stand: Contact guard assist   Step pivot transfers: Contact guard assist       General transfer comment: STS x2; increased fatigue.    Ambulation/Gait Ambulation/Gait assistance: Min assist Gait Distance (Feet): 60 Feet Assistive device: Rolling walker (2 wheels) Gait Pattern/deviations: Step-through pattern, Decreased stride length Gait velocity: decreased     General Gait Details: periods of CGA with ambulation. Increased unsteadiness with turns and when fatgiued. Reported increased LE muscle fatigue and mild SOB. O2 desat to 85% with ambulation while on 6L - recovery to 94% at end of session (see general comments).  Stairs            Wheelchair Mobility     Tilt Bed    Modified Rankin (Stroke Patients Only)       Balance Overall balance assessment: Needs assistance Sitting-balance support: Feet supported, No upper extremity supported Sitting balance-Leahy Scale: Good     Standing balance support: No upper extremity supported, During functional activity Standing balance-Leahy Scale: Good                               Pertinent Vitals/Pain Pain Assessment Pain Assessment:  (was having L neck/shoulder pain earlier after bed mobility)    Home Living  Family/patient expects to be discharged to:: Private residence Living Arrangements: Spouse/significant other Available Help at Discharge: Family Type of Home: House Home Access: Stairs to enter Entrance Stairs-Rails: Can reach both Entrance Stairs-Number of Steps: 3   Home Layout: One level Home Equipment: BSC/3in1;Grab bars -  tub/shower;Tub bench;Standard Walker (hurrycane) Additional Comments: Was sent home with O2 last month, but hasn't needing with O2 sats in mid 90s when monitoring at home. Spouse available to assist aside from running errands at home- no other family nearby.    Prior Function Prior Level of Function : Independent/Modified Independent             Mobility Comments: hurrycane for about 1 week. Reports falls most recent over the weekend- fell in yard and spouse assisted back into home and then fell again ADLs Comments: Bath/dress independently. Wife drives. Reports issues with grip strength dropping objects for about 3-4 days.     Extremity/Trunk Assessment   Upper Extremity Assessment Upper Extremity Assessment: Defer to OT evaluation    Lower Extremity Assessment Lower Extremity Assessment: RLE deficits/detail;LLE deficits/detail RLE Deficits / Details: grossly 4+/5, ankle DF/PF 5/5 LLE Deficits / Details: grossly 4+/5, except 4-/5 hip flexion, ankle DF/PF 5/5    Cervical / Trunk Assessment Cervical / Trunk Assessment: Normal  Communication   Communication Communication: Hearing impairment  Cognition Arousal: Alert Behavior During Therapy: WFL for tasks assessed/performed Overall Cognitive Status: Within Functional Limits for tasks assessed                                          General Comments General comments (skin integrity, edema, etc.): On 6L O2 upon arrival O2 sat 92-93%, 96bpm. Desat 88% on 6L with bed  mobility. 85% with ambulation on 6L-HR 108bpm, with recovery to 90% or greater in ~81min. 94% at end of session on 6L.    Exercises     Assessment/Plan    PT Assessment Patient needs continued PT services  PT Problem List Decreased strength;Decreased activity tolerance;Decreased balance;Decreased mobility;Decreased knowledge of use of DME;Cardiopulmonary status limiting activity       PT Treatment Interventions DME instruction;Gait  training;Stair training;Functional mobility training;Therapeutic activities;Therapeutic exercise;Balance training;Patient/family education    PT Goals (Current goals can be found in the Care Plan section)  Acute Rehab PT Goals Patient Stated Goal: improve energy levels PT Goal Formulation: With patient/family Time For Goal Achievement: 12/15/23 Potential to Achieve Goals: Good    Frequency Min 1X/week     Co-evaluation PT/OT/SLP Co-Evaluation/Treatment: Yes Reason for Co-Treatment: To address functional/ADL transfers PT goals addressed during session: Mobility/safety with mobility;Proper use of DME         AM-PAC PT "6 Clicks" Mobility  Outcome Measure Help needed turning from your back to your side while in a flat bed without using bedrails?: None Help needed moving from lying on your back to sitting on the side of a flat bed without using bedrails?: A Little Help needed moving to and from a bed to a chair (including a wheelchair)?: A Little Help needed standing up from a chair using your arms (e.g., wheelchair or bedside chair)?: A Little Help needed to walk in hospital room?: A Little Help needed climbing 3-5 steps with a railing? : A Lot 6 Click Score: 18    End of Session Equipment Utilized During Treatment: Gait belt;Oxygen Activity Tolerance: Patient limited by fatigue Patient left: in chair;with  call bell/phone within reach;with chair alarm set;with family/visitor present Nurse Communication: Mobility status;Other (comment) (O2 sats) PT Visit Diagnosis: Unsteadiness on feet (R26.81);Muscle weakness (generalized) (M62.81);Repeated falls (R29.6)    Time: 1610-9604 PT Time Calculation (min) (ACUTE ONLY): 40 min   Charges:   PT Evaluation $PT Eval Low Complexity: 1 Low PT Treatments $Therapeutic Activity: 8-22 mins PT General Charges $$ ACUTE PT VISIT: 1 Visit         Lyman Speller PT, DPT  Acute Rehabilitation Services  Office [phone]  12/01/2023, 2:24  PM

## 2023-12-01 NOTE — Progress Notes (Signed)
Pt calm sitting in bed  eating his dinner, pt stated he has slight pain in his shoulder   medicated per protocol   bed locked in low position call bell w in reach  safety measures in place handoff report completed with  Rodrick Lpn

## 2023-12-01 NOTE — Evaluation (Signed)
Occupational Therapy Evaluation Patient Details Name: Nathan Fernandez MRN: [id number] DOB: [birth year] Today's Date: 12/01/2023   History of Present Illness Pt is an 82 yr old male admitted to the hospital with community-acquired pneumonia, weakness, and recent falls. Past medical history significant for CAD, COPD on room air, non-insulin-dependent type 2 diabetes, history of DVT on Xarelto, MI, OA, CVA, and lumbar laminectomy/decompression microdiscectomy.   Clinical Impression   The pt is currently presenting below his baseline level of functioning for self-care management, given the below listed deficits (see OT problem list). The pt is currently requiring 6L O2 via nasal cannula (no O2 used at baseline). His O2 saturation decreased to 85% with activity; he subsequently presented with reports of increased fatigue with activity and feelings of mild shortness of breath. He further reported a couple recent falls, numbness & tingling of all extremities, and recent difficulty maintaining grasp of cups/other objects due to feelings of acute R hand weakness. He will benefit from further OT services to maximize his independence with ADLs and to decrease the risk for restricted participation in meaningful activities.  Patient will benefit from continued inpatient follow up therapy, <3 hours/day.       If plan is discharge home, recommend the following: Assist for transportation;Assistance with cooking/housework;Help with stairs or ramp for entrance;A little help with bathing/dressing/bathroom    Functional Status Assessment  Patient has had a recent decline in their functional status and demonstrates the ability to make significant improvements in function in a reasonable and predictable amount of time.  Equipment Recommendations  Other (comment) (defer to next level of care)    Recommendations for Other Services       Precautions / Restrictions Precautions Precautions: Fall Restrictions Weight  Bearing Restrictions: No Other Position/Activity Restrictions: monitor O2 saturation      Mobility Bed Mobility Overal bed mobility: Needs Assistance Bed Mobility: Supine to Sit     Supine to sit: Supervision, HOB elevated     General bed mobility comments: no use of bed rails, HOB slightly elevated. Increased time sitting EOB for recovery due to fatigue.    Transfers Overall transfer level: Needs assistance Equipment used: Rolling walker (2 wheels) Transfers: Sit to/from Stand, Bed to chair/wheelchair/BSC Sit to Stand: Contact guard assist     Step pivot transfers: Contact guard assist            Balance     Sitting balance-Leahy Scale: Good         Standing balance comment: CGA to min assist with RW                ADL either performed or assessed with clinical judgement   ADL Overall ADL's : Needs assistance/impaired Eating/Feeding: Set up;Sitting Eating/Feeding Details (indicate cue type and reason): pt reports occasional difficulty maintaining grasp of feeding utensils and cups; this has been a recent occurence Grooming: Set up;Sitting           Upper Body Dressing : Set up;Sitting   Lower Body Dressing: Contact guard assist;Minimal assistance   Toilet Transfer: Minimal assistance;Rolling walker (2 wheels);Ambulation Toilet Transfer Details (indicate cue type and reason): at bathroom level, based on clinical judgement                  Pertinent Vitals/Pain Pain Assessment Pain Assessment: No/denies pain     Extremity/Trunk Assessment Upper Extremity Assessment Upper Extremity Assessment: Right hand dominant;Overall WFL for tasks assessed (BUE AROM WFL. BUE grip strength 4/5. He also reports partial  nunbness and tingling  of his LUE, R hand, and BLE)   Lower Extremity Assessment Lower Extremity Assessment: RLE deficits/detail;LLE deficits/detail RLE Deficits / Details: grossly 4+/5, ankle DF/PF 5/5 (per PT assessment) LLE Deficits  / Details: grossly 4+/5, except 4-/5 hip flexion, ankle DF/PF 5/5 (per PT assessment)     Communication Communication Communication: Hearing impairment   Cognition Arousal: Alert Behavior During Therapy: WFL for tasks assessed/performed Overall Cognitive Status: Within Functional Limits for tasks assessed          General Comments  On 6L O2 upon arrival O2 sat 92-93%, 96bpm. Desat 88% on 6L with bed  mobility. 85% with ambulation on 6L, HR 108bpm, with recovery to 90% or greater in ~1 min. 94% at end of session on 6L.            Home Living Family/patient expects to be discharged to:: Private residence Living Arrangements: Spouse/significant other Available Help at Discharge: Family Type of Home: House Home Access: Stairs to enter Secretary/administrator of Steps: 3 Entrance Stairs-Rails: Can reach both Home Layout: One level     Bathroom Shower/Tub: Chief Strategy Officer: Standard     Home Equipment: BSC/3in1;Grab bars - tub/shower;Other (comment) (Hurrycane)   Additional Comments: Was sent home with O2 last month, but hasn't been needing to use, per spouse and pt. Spouse available to assist aside from running errands at home- no other family nearby.      Prior Functioning/Environment Prior Level of Function : Independent/Modified Independent             Mobility Comments: Started using a hurry cane for about 1 week; no AD used prior to that. Reports falls most recent over the weekend- fell in yard and spouse assisted back into home and then fell again. ADLs Comments: Pt bathes, toilets and dresses independently, though occasionally difficult with dressing. Wife drives, cooks and cleans. Reports issues with grip strength dropping objects for about 3-4 days.        OT Problem List: Decreased strength;Decreased activity tolerance;Impaired balance (sitting and/or standing);Decreased knowledge of use of DME or AE;Cardiopulmonary status limiting activity       OT Treatment/Interventions: Self-care/ADL training;Therapeutic exercise;Energy conservation;DME and/or AE instruction;Balance training;Patient/family education;Therapeutic activities    OT Goals(Current goals can be found in the care plan section) Acute Rehab OT Goals OT Goal Formulation: With patient/family Time For Goal Achievement: 12/15/23 Potential to Achieve Goals: Good ADL Goals Pt Will Perform Grooming: with supervision;standing Pt Will Perform Lower Body Dressing: with supervision;sit to/from stand Pt Will Transfer to Toilet: with supervision;ambulating Pt Will Perform Toileting - Clothing Manipulation and hygiene: with supervision;sit to/from stand  OT Frequency: Min 1X/week    Co-evaluation PT/OT/SLP Co-Evaluation/Treatment: Yes Reason for Co-Treatment: To address functional/ADL transfers PT goals addressed during session: Mobility/safety with mobility;Proper use of DME        AM-PAC OT "6 Clicks" Daily Activity     Outcome Measure Help from another person eating meals?: None Help from another person taking care of personal grooming?: A Little Help from another person toileting, which includes using toliet, bedpan, or urinal?: A Little Help from another person bathing (including washing, rinsing, drying)?: A Lot Help from another person to put on and taking off regular upper body clothing?: A Little Help from another person to put on and taking off regular lower body clothing?: A Little 6 Click Score: 18   End of Session Equipment Utilized During Treatment: Gait belt;Rolling walker (2 wheels);Oxygen Nurse Communication: Mobility  status  Activity Tolerance: Patient limited by fatigue Patient left: in chair;with call bell/phone within reach;with chair alarm set;with family/visitor present  OT Visit Diagnosis: Unsteadiness on feet (R26.81);Muscle weakness (generalized) (M62.81)                Time: 3474-2595 OT Time Calculation (min): 41 min Charges:  OT General  Charges $OT Visit: 1 Visit OT Evaluation $OT Eval Moderate Complexity: 1 Mod    Katerina Zurn L Sueanne Maniaci, OTR/L 12/01/2023, 3:11 PM

## 2023-12-01 NOTE — Progress Notes (Signed)
[facility] PROGRESS NOTE  DIANE BUNDREN (DOB: 1941/10/05) ZOX:[id number] PCP: Joaquim Nam, MD  Brief Narrative: Nathan Fernandez is a 82 y.o. male with medical history significant for CAD, COPD on room air, non-insulin-dependent type 2 diabetes, history of DVT on Xarelto being admitted to the hospital with community-acquired pneumonia.  He lives independently in the community with his wife, who came with him to [facility] this morning, complaining of multiple falls over the past week due to weakness in his left arm and leg.  He has been gradually worsening.  He has some chronic cervical radiculopathy, is on Flexeril was on oxycodone for this previously.  Oxycodone was recently discontinued due to some mild confusion.  Indicates, patient tells me that he has had some worsening of his chronic cough.  Due to the persistent weakness, he came to the ER for evaluation, was found on CT scan to have evidence of possible community-acquired pneumonia.  He was started on empiric IV antibiotics, he was placed on 4 L nasal cannula oxygen due to hypoxia.   Subjective: Working with PT, feels ok, not much better, just short of breath. Denies exertional chest pain. Required 6L O2 with therapy this afternoon.   Objective: BP (!) 142/92 (BP Location: Right Arm)   Pulse 93   Temp 98.1 F (36.7 C) (Oral)   Resp 17   Wt 70 kg   SpO2 98%   BMI 22.79 kg/m   Gen: Elderly pleasant male in no acute distress Pulm: Crackles at bases, no wheezing, nonlabored but tachypneic  CV: RRR, no MRG, 1+ LE edema GI: Soft, NT, ND, +BS  Neuro: Alert and oriented. No new focal deficits. Ext: Warm, no deformities Skin: No new rashes, lesions or ulcers on visualized skin   Assessment & Plan: Sepsis and acute hypoxic respiratory failure due to CAP:  meeting criteria with tachycardia, leukocytosis, source is community-acquired pneumonia.   - Continue CTX, azithromycin. Check PCT in AM. Given his cardiac history,  could be a significant component of cardiogenic pulmonary edema. Leukocytosis resolved.  - Continue supplemental oxygen     Acute on chronic HFrEF, CAD, HLD: LVEF 40-45%, global hypokinesis with known CAD that is not amenable to PCI, per coronary angiography Oct 2024. Mildly elevated troponin this admit without chest pain can be expected in this setting.  - BNP elevated to 475, CHF suggested on chest CT with pleural effusion. Start diuresis, trial with lasix 40mg  IV x1. Check weights, strict I/O - Continue statin  Type 2 diabetes-well-controlled, not insulin-dependent -Carb controlled diet -Sliding scale insulin   Hypertension-continue home Aldactone, Imdur, Coreg   History of DVT:  - continue home Xarelto   Neuropathy-gabapentin, Flexeril for spasms   Weakness-presumably due to his community-acquired pneumonia, continue PT/OT  Tyrone Nine, MD [facility] [email or web address] 12/01/2023, 7:46 PM

## 2023-12-01 NOTE — Care Management Obs Status (Signed)
MEDICARE OBSERVATION STATUS NOTIFICATION   Patient Details  Name: Nathan Fernandez MRN: [id number] Date of Birth: 06-18-1941   Medicare Observation Status Notification Given:  Yes    Otelia Santee, LCSW 12/01/2023, 11:35 AM

## 2023-12-02 ENCOUNTER — Telehealth: Payer: Self-pay | Admitting: Family Medicine

## 2023-12-02 ENCOUNTER — Inpatient Hospital Stay (HOSPITAL_COMMUNITY): Payer: Medicare Other

## 2023-12-02 DIAGNOSIS — D72829 Elevated white blood cell count, unspecified: Secondary | ICD-10-CM | POA: Diagnosis not present

## 2023-12-02 DIAGNOSIS — J9601 Acute respiratory failure with hypoxia: Secondary | ICD-10-CM | POA: Diagnosis not present

## 2023-12-02 DIAGNOSIS — J189 Pneumonia, unspecified organism: Secondary | ICD-10-CM | POA: Diagnosis not present

## 2023-12-02 LAB — BASIC METABOLIC PANEL
Anion gap: 7 (ref 5–15)
BUN: 25 mg/dL — ABNORMAL HIGH (ref 8–23)
CO2: 26 mmol/L (ref 22–32)
Calcium: 8.2 mg/dL — ABNORMAL LOW (ref 8.9–10.3)
Chloride: 100 mmol/L (ref 98–111)
Creatinine, Ser: 0.96 mg/dL (ref 0.61–1.24)
GFR, Estimated: >60 mL/min (ref >60)
Glucose, Bld: 132 mg/dL — ABNORMAL HIGH (ref 70–99)
Potassium: 3.6 mmol/L (ref 3.5–5.1)
Sodium: 133 mmol/L — ABNORMAL LOW (ref 135–145)

## 2023-12-02 LAB — PROCALCITONIN: Procalcitonin: <0.1 ng/mL

## 2023-12-02 LAB — MAGNESIUM: Magnesium: 1.9 mg/dL (ref 1.7–2.4)

## 2023-12-02 LAB — GLUCOSE, CAPILLARY
Glucose-Capillary: 118 mg/dL — ABNORMAL HIGH (ref 70–99)
Glucose-Capillary: 153 mg/dL — ABNORMAL HIGH (ref 70–99)
Glucose-Capillary: 125 mg/dL — ABNORMAL HIGH (ref 70–99)
Glucose-Capillary: 140 mg/dL — ABNORMAL HIGH (ref 70–99)

## 2023-12-02 LAB — HEPATIC FUNCTION PANEL
ALT: 13 U/L (ref 0–44)
AST: 23 U/L (ref 15–41)
Albumin: 2.2 g/dL — ABNORMAL LOW (ref 3.5–5.0)
Alkaline Phosphatase: 76 U/L (ref 38–126)
Bilirubin, Direct: 0.2 mg/dL (ref 0.0–0.2)
Indirect Bilirubin: 0.4 mg/dL (ref 0.3–0.9)
Total Bilirubin: 0.6 mg/dL (ref <1.2)
Total Protein: 5.1 g/dL — ABNORMAL LOW (ref 6.5–8.1)

## 2023-12-02 LAB — CBC
HCT: 33.5 % — ABNORMAL LOW (ref 39.0–52.0)
Hemoglobin: 11 g/dL — ABNORMAL LOW (ref 13.0–17.0)
MCH: 31.1 pg (ref 26.0–34.0)
MCHC: 32.8 g/dL (ref 30.0–36.0)
MCV: 94.6 fL (ref 80.0–100.0)
Platelets: 180 10*3/uL (ref 150–400)
RBC: 3.54 MIL/uL — ABNORMAL LOW (ref 4.22–5.81)
RDW: 14.3 % (ref 11.5–15.5)
WBC: 10.6 10*3/uL — ABNORMAL HIGH (ref 4.0–10.5)
nRBC: 0 % (ref 0.0–0.2)

## 2023-12-02 LAB — PHOSPHORUS: Phosphorus: 2.9 mg/dL (ref 2.5–4.6)

## 2023-12-02 MED ORDER — FUROSEMIDE 10 MG/ML IJ SOLN
40.0000 mg | Freq: Once | INTRAMUSCULAR | Status: AC
  Administered 2023-12-02: 40 mg via INTRAVENOUS
  Filled 2023-12-02: qty 4

## 2023-12-02 NOTE — Telephone Encounter (Signed)
Patient wife called in and stated that patient is currently in the hospital for pneumonia. She just wanted to make Dr. Para March aware. Thank you!

## 2023-12-02 NOTE — Progress Notes (Signed)
PROGRESS NOTE    Nathan Fernandez  ZOX:[id number] DOB: 1941-11-16 DOA: 11/30/2023 PCP: Joaquim Nam, MD   Brief Narrative:  Nathan Fernandez is a 82 y.o. male with medical history significant for CAD, COPD on room air, non-insulin-dependent type 2 diabetes, history of DVT on Xarelto being admitted to the hospital with community-acquired pneumonia.  He lives independently in the community with his wife, who came with him to [facility] this morning, complaining of multiple falls over the past week due to weakness in his left arm and leg.  He has been gradually worsening.  He has some chronic cervical radiculopathy, is on Flexeril was on oxycodone for this previously.  Oxycodone was recently discontinued due to some mild confusion.  Indicates, patient tells me that he has had some worsening of his chronic cough.  Due to the persistent weakness, he came to the ER for evaluation, was found on CT scan to have evidence of possible community-acquired pneumonia.  He was started on empiric IV antibiotics, he was placed on 4 L nasal cannula oxygen due to hypoxia.   **CXR done and showed worsening and increasing interstitial markings with progressive streaky right basilar opacity.   Assessment and Plan:  Sepsis and Acute Hypoxic Respiratory Failure due to CAP:  -Meeting criteria with tachycardia, leukocytosis, source is community-acquired pneumonia.   -Continue CTX, Azithromycin. Checked PCT and was <0.10.  -Given his cardiac history, could be a significant component of cardiogenic pulmonary edema.  -Leukocytosis Trend slightly : Recent Labs  Lab 11/13/23 1155 11/30/23 1019 12/01/23 0444 12/02/23 0515  WBC 8.7 12.9* 9.8 10.6*  -Continue supplemental oxygen via nasal cannula wean O2 as tolerated-continuous pulse oximetry maintain O2 saturation greater than 90% -Repeat CXR this AM done and showed "Increased interstitial markings with progressive streaky right basilar opacity, suspicious for  developing pneumonia." -Patient will need an Ambulatory home O2 screen prior to discharge and repeat chest x-ray in a.m.    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  -Mildly elevated troponin this admit without chest pain can be expected in this setting.  -BNP elevated to 475.3, CHF suggested on chest CT with pleural effusion. -Given Diuresis, trial with lasix 40mg  IV x1 yesterday and redose this AM -Continue Rosuvastatin 20 mg po Daily   Type 2 Diabetes Mellitus  -Well-controlled, not insulin-dependent -C/w Carb Controlled Diet -C/w Moderate Novolog Sliding Scale Insulin AC/HS -CBG Trend:  Recent Labs  Lab 12/01/23 0807 12/01/23 1142 12/01/23 1710 12/01/23 2211 12/02/23 0732 12/02/23 1209 12/02/23 1707  GLUCAP 144* 136* 119* 151* 118* 153* 125*    Hypertension -Continue home Spironolactone 12.5 mg po Daily, Isosorbide Mononitrate 15 mg po Daily and Carvedilol 6.25 mg po BID -Received IV Furosemide 40 mg x1 yesterday -Continue to Monitor BP per Protocol  -Last BP reading was 146/96   History of DVT -Continue Anticoagulation with Rivaroxaban 20 mg po Daily    Neuropathy -C/w Gabapentin 300 mg po TID and Cyclobenzaprine 5 mg po TIDprn Muscle Spasms -Recent MRI Cervical Spine done and showed "Multilevel cervical spondylosis, worst at C6-7 where there is moderate spinal canal stenosis and severe left and moderate right neural foraminal narrowing. Multilevel moderate to severe neural foraminal narrowing, as detailed above. Extensive T2 hyperintense marrow signal in the left C6 articular pillar with indistinct cortical margins on T1 weighted images. This could represent advanced degenerative change; however, recommend correlation with cervical  spine CT to exclude the possibility of a destructive, marrow replacing bone lesion." -Will discuss with Neurosurgery in the AM about MRI  findings    Weakness -Presumably due to his community-acquired pneumonia, continue PT/OT  Normocytic Anemia -Hgb/Hct Trend: Recent Labs  Lab 11/13/23 1155 11/30/23 1019 12/01/23 0444 12/02/23 0515  HGB 12.5* 11.9* 10.2* 11.0*  HCT 38.1* 35.4* 31.1* 33.5*  MCV 93.3 91.2 94.5 94.6  -Check Anemia Panel in the AM -Continue to Monitor for S/Sx of Bleeding; No overt bleeding noted -Repeat CBC in the AM   Hypoalbuminemia -Patient's Albumin Trend: Recent Labs  Lab 11/13/23 1155 12/02/23 0513  ALBUMIN 3.1* 2.2*  -Continue to Monitor and Trend and repeat CMP in the AM   DVT prophylaxis:  rivaroxaban (XARELTO) tablet 20 mg    Code Status: Full Code Family Communication: No family present at bedside   Disposition Plan:  Level of care: Med-Surg Status is: Inpatient Remains inpatient appropriate because: No family present at bedside   Consultants:  None  Procedures:  As delineated as above  Antimicrobials:  Anti-infectives (From admission, onward)    Start     Dose/Rate Route Frequency Ordered Stop   12/01/23 1000  azithromycin (ZITHROMAX) 500 mg in sodium chloride 0.9 % 250 mL IVPB        500 mg 250 mL/hr over 60 Minutes Intravenous Every 24 hours 11/30/23 1725 12/05/23 0959   12/01/23 1000  cefTRIAXone (ROCEPHIN) 1 g in sodium chloride 0.9 % 100 mL IVPB        1 g 200 mL/hr over 30 Minutes Intravenous Every 24 hours 11/30/23 1725 12/05/23 0959   11/30/23 1315  cefTRIAXone (ROCEPHIN) 1 g in sodium chloride 0.9 % 100 mL IVPB        1 g 200 mL/hr over 30 Minutes Intravenous  Once 11/30/23 1308 11/30/23 1500   11/30/23 1315  doxycycline (VIBRA-TABS) tablet 100 mg        100 mg Oral  Once 11/30/23 1308 11/30/23 1346       Subjective: Seen and examined at bedside and was doing ok. Still requiring oxygen. No CP or SOB. Denies any lightheadedness or dizziness.   Objective: Vitals:   12/02/23 0524 12/02/23 1019 12/02/23 1755 12/02/23 1943  BP: 130/84 (!) 167/107 132/83  (!) 146/96  Pulse: 99  95 98  Resp: 17  18 20   Temp: 98 F (36.7 C)  97.8 F (36.6 C) 97.8 F (36.6 C)  TempSrc: Oral     SpO2: 93%  95% 97%  Weight: 74.8 kg       Intake/Output Summary (Last 24 hours) at 12/02/2023 2011 Last data filed at 12/01/2023 2300 Gross per 24 hour  Intake --  Output 700 ml  Net -700 ml   Filed Weights   11/30/23 1014 12/02/23 0524  Weight: 70 kg 74.8 kg   Examination: Physical Exam:  Constitutional: Thin Caucasian male in NAD Respiratory: Diminished to auscultation bilaterally with coarse breath sounds with rhonchi and mild crackles but no wheezing, rales. Normal respiratory effort and patient is not tachypenic. No accessory muscle use. Wearing Supplemental O2  Cardiovascular: RRR, no murmurs / rubs / gallops. S1 and S2 auscultated. No extremity edema.  Abdomen: Soft, non-tender, non-distended. Bowel sounds positive.  GU: Deferred. Musculoskeletal: No clubbing / cyanosis of digits/nails. No joint deformity upper and lower extremities.  Skin: No rashes, lesions, ulcers on a limited skin evaluation. No induration; Warm and dry.  Neurologic: CN 2-12 grossly intact with no focal deficits.  Sensation intact in all 4 Extremities,  Psychiatric: Normal judgment and insight. Alert and oriented x 3. Normal mood and appropriate affect.   Data Reviewed: I have personally reviewed following labs and imaging studies  CBC: Recent Labs  Lab 11/30/23 1019 12/01/23 0444 12/02/23 0515  WBC 12.9* 9.8 10.6*  HGB 11.9* 10.2* 11.0*  HCT 35.4* 31.1* 33.5*  MCV 91.2 94.5 94.6  PLT 161 143* 180   Basic Metabolic Panel: Recent Labs  Lab 11/30/23 1019 12/01/23 0444 12/02/23 0513 12/02/23 0515  NA 137 137  --  133*  K 4.2 3.9  --  3.6  CL 102 106  --  100  CO2 26 22  --  26  GLUCOSE 168* 136*  --  132*  BUN 27* 23  --  25*  CREATININE 1.00 0.83  --  0.96  CALCIUM 8.6* 8.1*  --  8.2*  MG  --   --  1.9  --   PHOS  --   --  2.9  --    GFR: Estimated  Creatinine Clearance: 58.3 mL/min (by C-G formula based on SCr of 0.96 mg/dL). Liver Function Tests: Recent Labs  Lab 12/02/23 0513  AST 23  ALT 13  ALKPHOS 76  BILITOT 0.6  PROT 5.1*  ALBUMIN 2.2*   No results for input(s): "LIPASE", "AMYLASE" in the last 168 hours. No results for input(s): "AMMONIA" in the last 168 hours. Coagulation Profile: No results for input(s): "INR", "PROTIME" in the last 168 hours. Cardiac Enzymes: No results for input(s): "CKTOTAL", "CKMB", "CKMBINDEX", "TROPONINI" in the last 168 hours. BNP (last 3 results) No results for input(s): "PROBNP" in the last 8760 hours. HbA1C: No results for input(s): "HGBA1C" in the last 72 hours. CBG: Recent Labs  Lab 12/01/23 1710 12/01/23 2211 12/02/23 0732 12/02/23 1209 12/02/23 1707  GLUCAP 119* 151* 118* 153* 125*   Lipid Profile: No results for input(s): "CHOL", "HDL", "LDLCALC", "TRIG", "CHOLHDL", "LDLDIRECT" in the last 72 hours. Thyroid Function Tests: No results for input(s): "TSH", "T4TOTAL", "FREET4", "T3FREE", "THYROIDAB" in the last 72 hours. Anemia Panel: No results for input(s): "VITAMINB12", "FOLATE", "FERRITIN", "TIBC", "IRON", "RETICCTPCT" in the last 72 hours. Sepsis Labs: Recent Labs  Lab 11/30/23 1804 11/30/23 2059 12/02/23 0515  PROCALCITON  --   --  <0.10  LATICACIDVEN 1.2 1.2  --    Recent Results (from the past 240 hour(s))  Resp panel by RT-PCR (RSV, Flu A&B, Covid) Anterior Nasal Swab     Status: None   Collection Time: 11/30/23 10:19 AM   Specimen: Anterior Nasal Swab  Result Value Ref Range Status   SARS Coronavirus 2 by RT PCR NEGATIVE NEGATIVE Final    Comment: (NOTE) SARS-CoV-2 target nucleic acids are NOT DETECTED.  The SARS-CoV-2 RNA is generally detectable in upper respiratory specimens during the acute phase of infection. The lowest concentration of SARS-CoV-2 viral copies this assay can detect is 138 copies/mL. A negative result does not preclude  SARS-Cov-2 infection and should not be used as the sole basis for treatment or other patient management decisions. A negative result may occur with  improper specimen collection/handling, submission of specimen other than nasopharyngeal swab, presence of viral mutation(s) within the areas targeted by this assay, and inadequate number of viral copies(<138 copies/mL). A negative result must be combined with clinical observations, patient history, and epidemiological information. The expected result is Negative.  Fact Sheet for Patients:  BloggerCourse.com  Fact Sheet for Healthcare Providers:  SeriousBroker.it  This test is no  t yet approved or cleared by the [place] and  has been authorized for detection and/or diagnosis of SARS-CoV-2 by FDA under an Emergency Use Authorization (EUA). This EUA will remain  in effect (meaning this test can be used) for the duration of the COVID-19 declaration under Section 564(b)(1) of the Act, 21 U.S.C.section 360bbb-3(b)(1), unless the authorization is terminated  or revoked sooner.       Influenza A by PCR NEGATIVE NEGATIVE Final   Influenza B by PCR NEGATIVE NEGATIVE Final    Comment: (NOTE) The Xpert Xpress SARS-CoV-2/FLU/RSV plus assay is intended as an aid in the diagnosis of influenza from Nasopharyngeal swab specimens and should not be used as a sole basis for treatment. Nasal washings and aspirates are unacceptable for Xpert Xpress SARS-CoV-2/FLU/RSV testing.  Fact Sheet for Patients: BloggerCourse.com  Fact Sheet for Healthcare Providers: SeriousBroker.it  This test is not yet approved or cleared by the [place] FDA and has been authorized for detection and/or diagnosis of SARS-CoV-2 by FDA under an Emergency Use Authorization (EUA). This EUA will remain in effect (meaning this test can be used) for the duration of  the COVID-19 declaration under Section 564(b)(1) of the Act, 21 U.S.C. section 360bbb-3(b)(1), unless the authorization is terminated or revoked.     Resp Syncytial Virus by PCR NEGATIVE NEGATIVE Final    Comment: (NOTE) Fact Sheet for Patients: BloggerCourse.com  Fact Sheet for Healthcare Providers: SeriousBroker.it  This test is not yet approved or cleared by the [place] FDA and has been authorized for detection and/or diagnosis of SARS-CoV-2 by FDA under an Emergency Use Authorization (EUA). This EUA will remain in effect (meaning this test can be used) for the duration of the COVID-19 declaration under Section 564(b)(1) of the Act, 21 U.S.C. section 360bbb-3(b)(1), unless the authorization is terminated or revoked.  Performed at [facility], [address], [place], [place] [address]     Radiology Studies: DG CHEST PORT 1 VIEW  Result Date: 12/02/2023 CLINICAL DATA:  Shortness of breath EXAM: PORTABLE CHEST 1 VIEW COMPARISON:  10/31/2023, 11/30/2023 FINDINGS: Left chest wall loop recorder. Stable heart size. Aortic atherosclerosis. Increased interstitial markings with progressive streaky right basilar opacity. No pleural effusion or pneumothorax. IMPRESSION: Increased interstitial markings with progressive streaky right basilar opacity, suspicious for developing pneumonia. Electronically Signed   By: Duanne Guess D.O.   On: 12/02/2023 13:10    Scheduled Meds:  buPROPion  150 mg Oral Daily   carvedilol  6.25 mg Oral BID   fenofibrate  54 mg Oral Daily   furosemide  40 mg Intravenous Once   gabapentin  300 mg Oral TID   hydrALAZINE  10 mg Oral Q12H   insulin aspart  0-15 Units Subcutaneous TID WC   insulin aspart  0-5 Units Subcutaneous QHS   isosorbide mononitrate  15 mg Oral Daily   rivaroxaban  20 mg Oral Q supper   rosuvastatin  20 mg Oral Daily   spironolactone  12.5 mg Oral Daily   Continuous  Infusions:  azithromycin 500 mg (12/02/23 1242)   cefTRIAXone (ROCEPHIN)  IV 1 g (12/02/23 1206)    LOS: 1 day   Marguerita Merles, DO [facility] Available via Epic secure chat 7am-7pm After these hours, please refer to coverage provider listed on [email or web address] 12/02/2023, 8:11 PM

## 2023-12-02 NOTE — Plan of Care (Signed)
  Problem: Education: Goal: Knowledge of General Education information will improve Description: Including pain rating scale, medication(s)/side effects and non-pharmacologic comfort measures Outcome: Progressing   Problem: Health Behavior/Discharge Planning: Goal: Ability to manage health-related needs will improve Outcome: Progressing   Problem: Clinical Measurements: Goal: Ability to maintain clinical measurements within normal limits will improve Outcome: Progressing   Problem: Clinical Measurements: Goal: Will remain free from infection Outcome: Progressing   Problem: Clinical Measurements: Goal: Diagnostic test results will improve Outcome: Progressing   Problem: Clinical Measurements: Goal: Respiratory complications will improve Outcome: Progressing   Problem: Clinical Measurements: Goal: Cardiovascular complication will be avoided Outcome: Progressing   Problem: Activity: Goal: Risk for activity intolerance will decrease Outcome: Progressing   Problem: Nutrition: Goal: Adequate nutrition will be maintained Outcome: Progressing   Problem: Coping: Goal: Level of anxiety will decrease Outcome: Progressing   Problem: Elimination: Goal: Will not experience complications related to bowel motility Outcome: Progressing   Problem: Pain Management: Goal: General experience of comfort will improve Outcome: Progressing

## 2023-12-02 NOTE — Progress Notes (Signed)
Carelink Summary Report / Loop Recorder 

## 2023-12-02 NOTE — Hospital Course (Addendum)
The patient is 82 year old with history of CAD, COPD on room air, non-insulin-dependent DM 2, Hx of DVT on Xarelto admitted for community-acquired pneumonia and Acute on Chronic Diastolic CHF.  Initially also thought to be slightly volume overload requiring IV Lasix.  During hospitalization started having some rectal bleeding concerning for possible hemorrhoids, seen by GI recommended Anusol suppositories.  Patient also had hematuria, case was discussed with urology who recommended CT hematuria.  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  Eventually CT chest showed concerns of multiple lung lesion, bone lesion and right-sided pulmonary embolism despite of being on Xarelto.  Xarelto was transitioned to Lovenox.  Outpatient appointment with medical oncology has been arranged.  Currently PT OT recommending SNF he continued to be a little volume overloaded so was given another dose of IV Lasix today.  Will transition over to oral Lasix at discharge him going to follow-up with PCP, Urology, Cardiology, Medical Oncology and Gastroenterology in outpatient setting.  Assessment & Plan:  Principal Problem:   CAP (community acquired pneumonia) Active Problems:   Anticoagulated   Rectal bleeding   Hemorrhoids   Constipation   Sepsis and Acute Hypoxic Respiratory Failure due to CAP and Acute on Chronic Diastolic CHF, as well as acute PE -Initially met sepsis criteria which is improving.  Patient has now completed 5 days of Rocephin and azithromycin.  Continue bronchodilators as needed, I-S/flutter valve. -PCT was less than 0.10 -SpO2: 94 % O2 Flow Rate (L/min): 3 L/min -Give another dose of IV Lasix today -Continue supplemental oxygen via nasal cannula and wean O2 as tolerated -Continuous pulse oximetry and maintain O2 saturations greater than 90% -Patient desaturated on his ambulatory home O2 screen previously and will need oxygen at discharge -Repeat chest  x-ray done and showed "Increased interstitial densities are noted bilaterally as described above."  Acute on Chronic HFrEF, improved CAD HLD -Currently chest pain-free.  Previous echocardiogram has shown EF of 40%.  -During hospitalization there was concerns of slight volume overload requiring IV diuretics.   Intake/Output Summary (Last 24 hours) at 12/11/2023 1516 Last data filed at 12/11/2023 0900 Gross per 24 hour  Intake 240 ml  Output 800 ml  Net -560 ml  -He appears slightly volume overloaded again today so we will give another dose of IV Lasix and will transition to oral Lasix 40 mg p.o. daily continue Coreg, hydralazine, Aldactone, Imdur -Repeat chest x-ray in 3 to 6 weeks  AKI -Mild. BUN/Cr Trend: Recent Labs  Lab 12/04/23 0545 12/05/23 0649 12/06/23 0559 12/07/23 0519 12/09/23 0541 12/10/23 0602 12/11/23 0522  BUN 27* 26* 25* 25* 37* 46* 46*  CREATININE 1.10 0.93 0.85 0.85 1.05 1.34* 1.34*  -Was diuresed with diuresis had been stopped will resume given a dose of IV Lasix and continue at discharge.  If necessary will hold spironolactone but continue to monitor for now -Avoid Nephrotoxic Medications, Contrast Dyes, Hypotension and Dehydration to Ensure Adequate Renal Perfusion and will need to Renally Adjust Meds -Continue to Monitor and Trend Renal Function carefully and repeat CMP in within 1 week  Pancreatic Adenocarcinoma with Metastasis.  -This was seen on incidental finding on CT hematuria.  Has pancreatic tail mass, peritoneal mass and liver lesions.   -Status post CT-guided peritoneal biopsy on 12/17 which showed "Moderately differentiated adenocarcinoma morphologically consistent  with a pancreatic primary." -Dr. Nelson Chimes Discussed with outpatient oncology as well, they have made appointment for his follow-up.   -CT chest showed multiple lung lesions and bone  lesions. -Patient has a outpatient Oncology Appt with Dr Arlana Pouch on Dec 30th 230pm at [facility]  health.   Right-sided pulmonary embolism, without cor pulmonale -Developed this despite of being on Xarelto.  Given concerns of underlying malignancy, will start Lovenox.  LE duplex as below -Should be on Lovenox indefinitely if it is not cost prohibitive  Type 2 Diabetes Mellitus  -Continue sliding scale and Accu-Chek.  Adjust as necessary -CBG Trend: Recent Labs  Lab 12/09/23 2122 12/10/23 0756 12/10/23 1126 12/10/23 1727 12/10/23 2032 12/11/23 0731 12/11/23 1205  GLUCAP 108* 121* 167* 144* 157* 124* 126*   Hypertension -Continue home Spironolactone 12.5 mg po Daily, Isosorbide Mononitrate 15 mg po Daily and Carvedilol 6.25 mg po BID  -Continue monitor blood pressures per protocol -Last blood pressure reading was 95/68   Neuropathy Continue as needed Flexeril.  3 times daily gabapentin. -Recent outpatient MRI has shown multilevel cervical spondylosis with canal narrowing.  Has outpatient follow-up appointment with neurosurgery on January 6.     Generalized Weakness -Presumably due to his community-acquired pneumonia, continue PT/OT recommending SNF SNF   Normocytic Anemia with now lower GI bleeding in the setting of Hemorrhoids and Hematuria -Hgb/Hct Trend: Recent Labs  Lab 12/05/23 0649 12/05/23 0924 12/06/23 0559 12/07/23 0519 12/09/23 0541 12/10/23 0602 12/11/23 0522  HGB 11.1* 10.3* 10.2* 10.3* 9.2* 8.7* 9.1*  HCT 32.9* 30.2* 30.2* 31.6* 27.9* 26.2* 28.7*  MCV 93.5  --  91.5 94.0 94.3 94.9 98.3  -Checked Anemia Panel and showed an iron level of 36, UIBC of 135, TIBC 171, saturation ratios of 21%, ferritin level 761, folate levels 12.5 and vitamin B12 873 -Changed anticoagulation to subcu Lovenox -Continue to Monitor for S/Sx of Bleeding; previously had hematuria and hematochezia which have resolved -Repeat CBC within 1 week  Hx of DVT and now acute DVT -Was on anticoagulation with rivaroxaban 20 mg p.o. daily but was held given his FOBT and his hematuria but  now has been changed to subcu Lovenox -Repeat LE duplex done and showed "RIGHT: Findings consistent with acute deep vein thrombosis involving the right peroneal veins, and right popliteal vein. LEFT: There is no evidence of deep vein thrombosis in the lower extremity. 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 Inflow velocity 11 cm/s. Outflow 13 cm/s. Collateral seen  proximal to aneurysm. Seen on previous ultrasound from 05/09/2022. Distal  ATA, PTA, and PeroA are all patent at the level of the ankle." -Continue with anticoagulation as above   LGIB/Hematochezia, resolved -In the setting of Hemorrhoids -C/w Hydrocortisone Suppository 25 mg RC BID -GI consulted and recommending good bowel regimen starting MiraLAX daily to twice daily and titrating up as needed and continue Anusol suppositories for week   Hematuria, resolved -Hematuria noted.  No evidence of UTI.  I had spoken with Dr. Annabell Howells who recommended a CT hematuria study -Hematuria has resolved and will need outpatient follow-up with urology  Generalized weakness and deconditioning -In the setting of metastatic cancer community-acquired pneumonia along with his other comorbidities -PT OT recommending SNF   Hypoalbuminemia -Patient's Albumin Trend: Recent Labs  Lab 12/02/23 0513 12/03/23 0540 12/04/23 0545 12/05/23 0649 12/06/23 0559 12/07/23 0519 12/11/23 0522  ALBUMIN 2.2* 2.2* 2.1* 2.3* 2.2* 2.3* 2.2*  -Continue to Monitor and Trend and repeat CMP within 1 week

## 2023-12-02 NOTE — Telephone Encounter (Signed)
Please thank her for the call.  The inpatient team is sending me his notes in the meantime.  I hope he feels better soon.  Thanks.

## 2023-12-02 NOTE — TOC Progression Note (Signed)
Transition of Care [facility]) - Progression Note    Patient Details  Name: Nathan Fernandez MRN: [id number] Date of Birth: [birth year]  Transition of Care [facility]) CM/SW Contact  Otelia Santee, LCSW Phone Number: 12/02/2023, 11:34 AM  Clinical Narrative:    Spoke with pt's spouse via t/c to review bed offers. Pt's spouse has accepted bed at [facility] for SNF. Pt not medically ready today but, may be medically ready in the next couple of days. Insurance auth requested and currently pending.  Pt's spouse shares pt had an MRI completed on 11/21/23 and she is curious about the results of this. She shares if pt has a pinched nerve she would prefer that to resolve prior to pt going to SNF.    Expected Discharge Plan: Skilled Nursing Facility Barriers to Discharge: Continued Medical Work up, SNF Pending bed offer  Expected Discharge Plan and Services In-house Referral: Clinical Social Work Discharge Planning Services: NA Post Acute Care Choice: Skilled Nursing Facility Living arrangements for the past 2 months: Single Family Home                 DME Arranged: N/A DME Agency: NA                   Social Determinants of Health (SDOH) Interventions SDOH Screenings   Food Insecurity: No Food Insecurity (11/30/2023)  Housing: Patient Declined (11/30/2023)  Transportation Needs: No Transportation Needs (11/30/2023)  Utilities: Not At Risk (11/30/2023)  Alcohol Screen: Low Risk  (11/24/2023)  Depression (PHQ2-9): Low Risk  (11/24/2023)  Financial Resource Strain: Low Risk  (11/24/2023)  Physical Activity: Inactive (11/24/2023)  Social Connections: Socially Integrated (11/24/2023)  Stress: Stress Concern Present (11/24/2023)  Tobacco Use: Medium Risk (11/30/2023)  Health Literacy: Adequate Health Literacy (11/24/2023)    Readmission Risk Interventions     No data to display

## 2023-12-02 NOTE — Plan of Care (Signed)

## 2023-12-02 NOTE — Telephone Encounter (Signed)
Spoke with patients spouse Steward Drone to let her know of Dr. Armanda Heritage notes and that wwe hope he feels better soon.

## 2023-12-03 ENCOUNTER — Inpatient Hospital Stay (HOSPITAL_COMMUNITY): Payer: Medicare Other

## 2023-12-03 DIAGNOSIS — I5033 Acute on chronic diastolic (congestive) heart failure: Secondary | ICD-10-CM

## 2023-12-03 DIAGNOSIS — J9601 Acute respiratory failure with hypoxia: Secondary | ICD-10-CM | POA: Diagnosis not present

## 2023-12-03 DIAGNOSIS — D72829 Elevated white blood cell count, unspecified: Secondary | ICD-10-CM | POA: Diagnosis not present

## 2023-12-03 DIAGNOSIS — J189 Pneumonia, unspecified organism: Secondary | ICD-10-CM | POA: Diagnosis not present

## 2023-12-03 LAB — CBC WITH DIFFERENTIAL/PLATELET
Abs Immature Granulocytes: 0.05 10*3/uL (ref 0.00–0.07)
Basophils Absolute: 0.1 10*3/uL (ref 0.0–0.1)
Basophils Relative: 1 %
Eosinophils Absolute: 0.2 10*3/uL (ref 0.0–0.5)
Eosinophils Relative: 3 %
HCT: 32.3 % — ABNORMAL LOW (ref 39.0–52.0)
Hemoglobin: 10.5 g/dL — ABNORMAL LOW (ref 13.0–17.0)
Immature Granulocytes: 1 %
Lymphocytes Relative: 9 %
Lymphs Abs: 0.8 10*3/uL (ref 0.7–4.0)
MCH: 30.3 pg (ref 26.0–34.0)
MCHC: 32.5 g/dL (ref 30.0–36.0)
MCV: 93.4 fL (ref 80.0–100.0)
Monocytes Absolute: 0.8 10*3/uL (ref 0.1–1.0)
Monocytes Relative: 9 %
Neutro Abs: 7.4 10*3/uL (ref 1.7–7.7)
Neutrophils Relative %: 77 %
Platelets: 201 10*3/uL (ref 150–400)
RBC: 3.46 MIL/uL — ABNORMAL LOW (ref 4.22–5.81)
RDW: 14.5 % (ref 11.5–15.5)
WBC: 9.3 10*3/uL (ref 4.0–10.5)
nRBC: 0 % (ref 0.0–0.2)

## 2023-12-03 LAB — COMPREHENSIVE METABOLIC PANEL
ALT: 14 U/L (ref 0–44)
AST: 23 U/L (ref 15–41)
Albumin: 2.2 g/dL — ABNORMAL LOW (ref 3.5–5.0)
Alkaline Phosphatase: 74 U/L (ref 38–126)
Anion gap: 11 (ref 5–15)
BUN: 27 mg/dL — ABNORMAL HIGH (ref 8–23)
CO2: 25 mmol/L (ref 22–32)
Calcium: 8.3 mg/dL — ABNORMAL LOW (ref 8.9–10.3)
Chloride: 100 mmol/L (ref 98–111)
Creatinine, Ser: 0.94 mg/dL (ref 0.61–1.24)
GFR, Estimated: >60 mL/min (ref >60)
Glucose, Bld: 142 mg/dL — ABNORMAL HIGH (ref 70–99)
Potassium: 3.6 mmol/L (ref 3.5–5.1)
Sodium: 136 mmol/L (ref 135–145)
Total Bilirubin: 0.7 mg/dL (ref <1.2)
Total Protein: 5.1 g/dL — ABNORMAL LOW (ref 6.5–8.1)

## 2023-12-03 LAB — GLUCOSE, CAPILLARY
Glucose-Capillary: 133 mg/dL — ABNORMAL HIGH (ref 70–99)
Glucose-Capillary: 156 mg/dL — ABNORMAL HIGH (ref 70–99)
Glucose-Capillary: 118 mg/dL — ABNORMAL HIGH (ref 70–99)
Glucose-Capillary: 144 mg/dL — ABNORMAL HIGH (ref 70–99)

## 2023-12-03 LAB — PHOSPHORUS: Phosphorus: 2.9 mg/dL (ref 2.5–4.6)

## 2023-12-03 LAB — MAGNESIUM: Magnesium: 1.8 mg/dL (ref 1.7–2.4)

## 2023-12-03 MED ORDER — MAGNESIUM SULFATE 2 GM/50ML IV SOLN
2.0000 g | Freq: Once | INTRAVENOUS | Status: AC
  Administered 2023-12-03: 2 g via INTRAVENOUS
  Filled 2023-12-03: qty 50

## 2023-12-03 MED ORDER — POTASSIUM CHLORIDE CRYS ER 20 MEQ PO TBCR
40.0000 meq | EXTENDED_RELEASE_TABLET | Freq: Once | ORAL | Status: AC
  Administered 2023-12-03: 40 meq via ORAL
  Filled 2023-12-03: qty 2

## 2023-12-03 MED ORDER — FUROSEMIDE 10 MG/ML IJ SOLN
40.0000 mg | Freq: Once | INTRAMUSCULAR | Status: AC
  Administered 2023-12-03: 40 mg via INTRAVENOUS
  Filled 2023-12-03: qty 4

## 2023-12-03 NOTE — Progress Notes (Signed)
Physical Therapy Treatment Patient Details Name: Nathan Fernandez MRN: [id number] DOB: 05/12/1941 Today's Date: 12/03/2023   History of Present Illness Pt is an 82 y.o. male admitted to the hospital with community-acquired pneumonia, weakness, and falls. Past medical history significant for CAD, COPD on room air, non-insulin-dependent type 2 diabetes, history of DVT on Xarelto, MI, OA, CVA, and lumbar laminectomy/decompression microdiscectomy.    PT Comments  General Comments: AxO x 3 very pleasant and willing but feeling "wore out" with MAX c/o fatigue. Assisted OOB to amb to bathroom was difficult.  General transfer comment: also asissted with a toilet transfer.  Max c/o weakness.  Unsteady esp with turns. General Gait Details: limited distance to and from bathroom this session due to MAX c/o fatigue/weakness.  RA decreased to 86% and HR 76.  Dyspnea 3/4.  Pt required 2 lts to achieve sats >90%.  SATURATION QUALIFICATIONS: (This note is used to comply with regulatory documentation for home oxygen)  Patient Saturations on Room Air at Rest =90%  Patient Saturations on Room Air while Ambulating 22 feet = 86%  Patient Saturations on 2 Liters of oxygen while Ambulating = 90%  Please briefly explain why patient needs home oxygen:  Pt requires supplemental oxygen to achieve therapeutic levels  Pt will need ST Rehab at SNF to address mobility and functional decline prior to safely returning home.    If plan is discharge home, recommend the following: A little help with walking and/or transfers;A little help with bathing/dressing/bathroom;Help with stairs or ramp for entrance;Assistance with cooking/housework;Assist for transportation   Can travel by private vehicle     Yes  Equipment Recommendations  None recommended by PT (has a walker at home)    Recommendations for Other Services       Precautions / Restrictions Precautions Precautions: Fall Restrictions Weight Bearing Restrictions  Per Provider Order: No Other Position/Activity Restrictions: monitor O2 saturation     Mobility  Bed Mobility Overal bed mobility: Needs Assistance Bed Mobility: Supine to Sit, Sit to Supine     Supine to sit: Supervision Sit to supine: Supervision   General bed mobility comments: no use of bed rails, HOB slightly elevated. Incr time sitting EOB for recovery due to fatigue.    Transfers Overall transfer level: Needs assistance Equipment used: Rolling walker (2 wheels), None Transfers: Sit to/from Stand Sit to Stand: Contact guard assist, Min assist           General transfer comment: also asissted with a toilet transfer.  Max c/o weakness.  Unsteady esp with turns.    Ambulation/Gait Ambulation/Gait assistance: Min assist, Mod assist Gait Distance (Feet): 22 Feet Assistive device: Rolling walker (2 wheels) Gait Pattern/deviations: Step-through pattern, Decreased stride length Gait velocity: decreased     General Gait Details: limited distance to and from bathroom this session due to MAX c/o fatigue/weakness.  RA decreased to 86% and HR 76.  Dyspnea 3/4.  Pt required 2 lts to achieve sats >90%.   Stairs             Wheelchair Mobility     Tilt Bed    Modified Rankin (Stroke Patients Only)       Balance                                            Cognition Arousal: Alert Behavior During Therapy: WFL for tasks assessed/performed  Overall Cognitive Status: Within Functional Limits for tasks assessed                                 General Comments: AxO x 3 very pleasant and willing but feeling "wore out" with MAX c/o fatigue        Exercises      General Comments        Pertinent Vitals/Pain Pain Assessment Pain Assessment: Faces Faces Pain Scale: Hurts a little bit Pain Location: B hips "Arthritis" Pain Descriptors / Indicators: Discomfort, Grimacing Pain Intervention(s): Monitored during session,  Repositioned, Patient requesting pain meds-RN notified    Home Living                          Prior Function            PT Goals (current goals can now be found in the care plan section) Progress towards PT goals: Progressing toward goals    Frequency    Min 1X/week      PT Plan      Co-evaluation              AM-PAC PT "6 Clicks" Mobility   Outcome Measure  Help needed turning from your back to your side while in a flat bed without using bedrails?: A Little Help needed moving from lying on your back to sitting on the side of a flat bed without using bedrails?: A Little Help needed moving to and from a bed to a chair (including a wheelchair)?: A Little Help needed standing up from a chair using your arms (e.g., wheelchair or bedside chair)?: A Little Help needed to walk in hospital room?: A Lot Help needed climbing 3-5 steps with a railing? : Total 6 Click Score: 15    End of Session Equipment Utilized During Treatment: Gait belt;Oxygen Activity Tolerance: Patient limited by fatigue Patient left: in bed;with call bell/phone within reach Nurse Communication: Mobility status PT Visit Diagnosis: Unsteadiness on feet (R26.81);Muscle weakness (generalized) (M62.81);Repeated falls (R29.6)     Time: 1212-1230 PT Time Calculation (min) (ACUTE ONLY): 18 min  Charges:    $Gait Training: 8-22 mins PT General Charges $$ ACUTE PT VISIT: 1 Visit                     Felecia Shelling  PTA Acute  Rehabilitation Services Office M-F          [phone]

## 2023-12-03 NOTE — Plan of Care (Signed)
No acute events this shift. The patient has rested comfortably for the majority of the shift. He has had no complaints of pain. He states that his breathing feels better, and he was weaned down to 2 LPM of supplemental oxygen without difficulty. VSS. Fall precautions in place. Will continue to monitor.  Problem: Education: Goal: Knowledge of General Education information will improve Description: Including pain rating scale, medication(s)/side effects and non-pharmacologic comfort measures Outcome: Progressing   Problem: Health Behavior/Discharge Planning: Goal: Ability to manage health-related needs will improve Outcome: Progressing   Problem: Clinical Measurements: Goal: Ability to maintain clinical measurements within normal limits will improve Outcome: Progressing Goal: Will remain free from infection Outcome: Progressing Goal: Diagnostic test results will improve Outcome: Progressing Goal: Respiratory complications will improve Outcome: Progressing Goal: Cardiovascular complication will be avoided Outcome: Progressing   Problem: Activity: Goal: Risk for activity intolerance will decrease Outcome: Progressing   Problem: Nutrition: Goal: Adequate nutrition will be maintained Outcome: Progressing   Problem: Coping: Goal: Level of anxiety will decrease Outcome: Progressing   Problem: Elimination: Goal: Will not experience complications related to bowel motility Outcome: Progressing Goal: Will not experience complications related to urinary retention Outcome: Progressing   Problem: Pain Management: Goal: General experience of comfort will improve Outcome: Progressing   Problem: Safety: Goal: Ability to remain free from injury will improve Outcome: Progressing   Problem: Skin Integrity: Goal: Risk for impaired skin integrity will decrease Outcome: Progressing   Problem: Education: Goal: Ability to describe self-care measures that may prevent or decrease  complications (Diabetes Survival Skills Education) will improve Outcome: Progressing Goal: Individualized Educational Video(s) Outcome: Progressing   Problem: Coping: Goal: Ability to adjust to condition or change in health will improve Outcome: Progressing   Problem: Fluid Volume: Goal: Ability to maintain a balanced intake and output will improve Outcome: Progressing   Problem: Health Behavior/Discharge Planning: Goal: Ability to identify and utilize available resources and services will improve Outcome: Progressing Goal: Ability to manage health-related needs will improve Outcome: Progressing   Problem: Metabolic: Goal: Ability to maintain appropriate glucose levels will improve Outcome: Progressing   Problem: Nutritional: Goal: Maintenance of adequate nutrition will improve Outcome: Progressing Goal: Progress toward achieving an optimal weight will improve Outcome: Progressing   Problem: Skin Integrity: Goal: Risk for impaired skin integrity will decrease Outcome: Progressing   Problem: Tissue Perfusion: Goal: Adequacy of tissue perfusion will improve Outcome: Progressing

## 2023-12-03 NOTE — Plan of Care (Signed)

## 2023-12-03 NOTE — Progress Notes (Signed)
PROGRESS NOTE    Nathan Fernandez  SAY:[id number] DOB: [birth year] DOA: 11/30/2023 PCP: Joaquim Nam, MD   Brief Narrative:  Nathan Fernandez is a 82 y.o. male with medical history significant for CAD, COPD on room air, non-insulin-dependent type 2 diabetes, history of DVT on Xarelto being admitted to the hospital with community-acquired pneumonia.  He lives independently in the community with his wife, who came with him to [facility] this morning, complaining of multiple falls over the past week due to weakness in his left arm and leg.  He has been gradually worsening.  He has some chronic cervical radiculopathy, is on Flexeril was on oxycodone for this previously.  Oxycodone was recently discontinued due to some mild confusion.  Indicates, patient tells me that he has had some worsening of his chronic cough.  Due to the persistent weakness, he came to the ER for evaluation, was found on CT scan to have evidence of possible community-acquired pneumonia.  He was started on empiric IV antibiotics, he was placed on 4 L nasal cannula oxygen due to hypoxia.   **CXR done yesterday and showed worsening and increasing interstitial markings with progressive streaky right basilar opacity.  Today that showed no significant change will continue diuresis given that it could be a pneumonia or fluid or combination.  Once he is adequately diuresed we can likely discharge him to SNF.  Assessment and Plan:  Sepsis and Acute Hypoxic Respiratory Failure due to CAP and Acute on Chronic Diastolic CHF:  -Meeting criteria with tachycardia, leukocytosis, source is community-acquired pneumonia.   -Continue CTX, Azithromycin. Checked PCT and was <0.10.  -Given his cardiac history, could be a significant component of cardiogenic pulmonary edema.  -Leukocytosis Trend slightly : Recent Labs  Lab 11/13/23 1155 11/30/23 1019 12/01/23 0444 12/02/23 0515 12/03/23 0540  WBC 8.7 12.9* 9.8 10.6* 9.3  -Continue  supplemental oxygen via nasal cannula wean O2 as tolerated-continuous pulse oximetry maintain O2 saturation greater than 90% -Repeat CXR this AM done below -Patient will need an Ambulatory home O2 screen prior to discharge and repeat chest x-ray in a.m.  -Patient desaturated on his ambulatory home O2 screen today and requires at least 2 L oxygen but his oxygen requirement is weaning.  Will continue with antibiotics and diuresis at this time and continue to wean   Acute on Chronic HFrEF CAD HLD -LVEF 40-45%, Global Hypokinesis with known CAD that is not amenable to PCI, per coronary angiography Oct 2024.  -Mildly elevated troponin this admit without chest pain can be expected in this setting.  -BNP elevated to 475.3, CHF suggested on chest CT with pleural effusion. -Initiated diuresis with IV Lasix 40 mg daily and he is improving slowly.  Will give him another dose tonight. -Strict I's and O's and daily weights;  Intake/Output Summary (Last 24 hours) at 12/03/2023 1747 Last data filed at 12/03/2023 1721 Gross per 24 hour  Intake 450 ml  Output --  Net 450 ml  -Continue Rosuvastatin 20 mg po Daily -CXR this Am done and showed "No significant change in aeration since yesterday. Interstitial and patchy hazy opacities continue to be noted in the lower lung fields consistent with edema, pneumonia or combination. Small pleural effusions." -Repeat CXR in the AM   Type 2 Diabetes Mellitus  -Well-controlled, not insulin-dependent -C/w Carb Controlled Diet -C/w Moderate Novolog Sliding Scale Insulin AC/HS -CBG Trend:  Recent Labs  Lab 12/02/23 0732 12/02/23 1209 12/02/23 1707 12/02/23 2148 12/03/23 0808 12/03/23 1152 12/03/23 1658  GLUCAP 118* 153* 125* 140* 133* 156* 118*    Hypertension -Continue home Spironolactone 12.5 mg po Daily, Isosorbide Mononitrate 15 mg po Daily and Carvedilol 6.25 mg po BID -Received IV Furosemide 40 mg x1 yesterday -Continue to Monitor BP per Protocol   -Last BP reading was 134/85   History of DVT -Continue Anticoagulation with Rivaroxaban 20 mg po Daily    Neuropathy -C/w Gabapentin 300 mg po TID and Cyclobenzaprine 5 mg po TIDprn Muscle Spasms -Recent MRI Cervical Spine done and showed "Multilevel cervical spondylosis, worst at C6-7 where there is moderate spinal canal stenosis and severe left and moderate right neural foraminal narrowing. Multilevel moderate to severe neural foraminal narrowing, as detailed above. Extensive T2 hyperintense marrow signal in the left C6 articular pillar with indistinct cortical margins on T1 weighted images. This could represent advanced degenerative change; however, recommend correlation with cervical spine CT to exclude the possibility of a destructive, marrow replacing bone lesion." -Will discuss with Neurosurgery in the AM about MRI findings given his Arm Pain   Weakness -Presumably due to his community-acquired pneumonia, continue PT/OT  Normocytic Anemia -Hgb/Hct Trend: Recent Labs  Lab 11/13/23 1155 11/30/23 1019 12/01/23 0444 12/02/23 0515 12/03/23 0540  HGB 12.5* 11.9* 10.2* 11.0* 10.5*  HCT 38.1* 35.4* 31.1* 33.5* 32.3*  MCV 93.3 91.2 94.5 94.6 93.4  -Check Anemia Panel in the AM -Continue to Monitor for S/Sx of Bleeding; No overt bleeding noted -Repeat CBC in the AM   Hypoalbuminemia -Patient's Albumin Trend: Recent Labs  Lab 11/13/23 1155 12/02/23 0513 12/03/23 0540  ALBUMIN 3.1* 2.2* 2.2*  -Continue to Monitor and Trend and repeat CMP in the AM   DVT prophylaxis:  rivaroxaban (XARELTO) tablet 20 mg    Code Status: Full Code Family Communication: No family currently at bedside Disposition Plan:  Level of care: Med-Surg Status is: Inpatient Remains inpatient appropriate because: Continues to get diuresed given his volume overload and will need to monitor his respiratory status carefully.  Oxygen requirement is weaning slowly   Consultants:  None  Procedures:  As  delineated as above  Antimicrobials:  Anti-infectives (From admission, onward)    Start     Dose/Rate Route Frequency Ordered Stop   12/01/23 1000  azithromycin (ZITHROMAX) 500 mg in sodium chloride 0.9 % 250 mL IVPB        500 mg 250 mL/hr over 60 Minutes Intravenous Every 24 hours 11/30/23 1725 12/05/23 0959   12/01/23 1000  cefTRIAXone (ROCEPHIN) 1 g in sodium chloride 0.9 % 100 mL IVPB        1 g 200 mL/hr over 30 Minutes Intravenous Every 24 hours 11/30/23 1725 12/05/23 0959   11/30/23 1315  cefTRIAXone (ROCEPHIN) 1 g in sodium chloride 0.9 % 100 mL IVPB        1 g 200 mL/hr over 30 Minutes Intravenous  Once 11/30/23 1308 11/30/23 1500   11/30/23 1315  doxycycline (VIBRA-TABS) tablet 100 mg        100 mg Oral  Once 11/30/23 1308 11/30/23 1346       Subjective: Seen and examined at bedside and his respiratory status was slowly improving.  He denied any pain but when asked about him about specific neuropathic pain or issues he denied this but which is complain about some shoulder pains and hand pain occasionally.  No other concerns or complaints this time.  Objective: Vitals:   12/03/23 1317 12/03/23 1431 12/03/23 1548 12/03/23 1622  BP:   134/85   Pulse:  97   Resp:   16   Temp:   98.5 F (36.9 C)   TempSrc:      SpO2: 96% 93% 95%   Weight:      Height:    5\' 9"  (1.753 m)    Intake/Output Summary (Last 24 hours) at 12/03/2023 1806 Last data filed at 12/03/2023 1721 Gross per 24 hour  Intake 450 ml  Output --  Net 450 ml   Filed Weights   11/30/23 1014 12/02/23 0524 12/03/23 0500  Weight: 70 kg 74.8 kg 73.7 kg   Examination: Physical Exam:  Constitutional: Thin elderly Caucasian male no acute distress Respiratory: Diminished to auscultation bilaterally with some coarse breath sounds on the have some mild rhonchi and crackles but no appreciable wheezing or rales. Normal respiratory effort and patient is not tachypenic. No accessory muscle use.  Unlabored  breathing Cardiovascular: RRR, no murmurs / rubs / gallops. S1 and S2 auscultated. No extremity edema.  Abdomen: Soft, non-tender, non-distended.  Bowel sounds positive.  GU: Deferred. Musculoskeletal: No clubbing / cyanosis of digits/nails. No joint deformity upper and lower extremities. Skin: No rashes, lesions, ulcers on limited skin evaluation. No induration; Warm and dry.  Neurologic: CN 2-12 grossly intact with no focal deficits. Romberg sign and cerebellar reflexes not assessed.  Psychiatric: Normal judgment and insight. Alert and oriented x 3. Normal mood and appropriate affect.   Data Reviewed: I have personally reviewed following labs and imaging studies  CBC: Recent Labs  Lab 11/30/23 1019 12/01/23 0444 12/02/23 0515 12/03/23 0540  WBC 12.9* 9.8 10.6* 9.3  NEUTROABS  --   --   --  7.4  HGB 11.9* 10.2* 11.0* 10.5*  HCT 35.4* 31.1* 33.5* 32.3*  MCV 91.2 94.5 94.6 93.4  PLT 161 143* 180 201   Basic Metabolic Panel: Recent Labs  Lab 11/30/23 1019 12/01/23 0444 12/02/23 0513 12/02/23 0515 12/03/23 0540  NA 137 137  --  133* 136  K 4.2 3.9  --  3.6 3.6  CL 102 106  --  100 100  CO2 26 22  --  26 25  GLUCOSE 168* 136*  --  132* 142*  BUN 27* 23  --  25* 27*  CREATININE 1.00 0.83  --  0.96 0.94  CALCIUM 8.6* 8.1*  --  8.2* 8.3*  MG  --   --  1.9  --  1.8  PHOS  --   --  2.9  --  2.9   GFR: Estimated Creatinine Clearance: 59.5 mL/min (by C-G formula based on SCr of 0.94 mg/dL). Liver Function Tests: Recent Labs  Lab 12/02/23 0513 12/03/23 0540  AST 23 23  ALT 13 14  ALKPHOS 76 74  BILITOT 0.6 0.7  PROT 5.1* 5.1*  ALBUMIN 2.2* 2.2*   No results for input(s): "LIPASE", "AMYLASE" in the last 168 hours. No results for input(s): "AMMONIA" in the last 168 hours. Coagulation Profile: No results for input(s): "INR", "PROTIME" in the last 168 hours. Cardiac Enzymes: No results for input(s): "CKTOTAL", "CKMB", "CKMBINDEX", "TROPONINI" in the last 168 hours. BNP  (last 3 results) No results for input(s): "PROBNP" in the last 8760 hours. HbA1C: No results for input(s): "HGBA1C" in the last 72 hours. CBG: Recent Labs  Lab 12/02/23 1707 12/02/23 2148 12/03/23 0808 12/03/23 1152 12/03/23 1658  GLUCAP 125* 140* 133* 156* 118*   Lipid Profile: No results for input(s): "CHOL", "HDL", "LDLCALC", "TRIG", "CHOLHDL", "LDLDIRECT" in the last 72 hours. Thyroid Function Tests: No results for  input(s): "TSH", "T4TOTAL", "FREET4", "T3FREE", "THYROIDAB" in the last 72 hours. Anemia Panel: No results for input(s): "VITAMINB12", "FOLATE", "FERRITIN", "TIBC", "IRON", "RETICCTPCT" in the last 72 hours. Sepsis Labs: Recent Labs  Lab 11/30/23 1804 11/30/23 2059 12/02/23 0515  PROCALCITON  --   --  <0.10  LATICACIDVEN 1.2 1.2  --    Recent Results (from the past 240 hours)  Resp panel by RT-PCR (RSV, Flu A&B, Covid) Anterior Nasal Swab     Status: None   Collection Time: 11/30/23 10:19 AM   Specimen: Anterior Nasal Swab  Result Value Ref Range Status   SARS Coronavirus 2 by RT PCR NEGATIVE NEGATIVE Final    Comment: (NOTE) SARS-CoV-2 target nucleic acids are NOT DETECTED.  The SARS-CoV-2 RNA is generally detectable in upper respiratory specimens during the acute phase of infection. The lowest concentration of SARS-CoV-2 viral copies this assay can detect is 138 copies/mL. A negative result does not preclude SARS-Cov-2 infection and should not be used as the sole basis for treatment or other patient management decisions. A negative result may occur with  improper specimen collection/handling, submission of specimen other than nasopharyngeal swab, presence of viral mutation(s) within the areas targeted by this assay, and inadequate number of viral copies(<138 copies/mL). A negative result must be combined with clinical observations, patient history, and epidemiological information. The expected result is Negative.  Fact Sheet for Patients:   BloggerCourse.com  Fact Sheet for Healthcare Providers:  SeriousBroker.it  This test is no t yet approved or cleared by the [place] FDA and  has been authorized for detection and/or diagnosis of SARS-CoV-2 by FDA under an Emergency Use Authorization (EUA). This EUA will remain  in effect (meaning this test can be used) for the duration of the COVID-19 declaration under Section 564(b)(1) of the Act, 21 U.S.C.section 360bbb-3(b)(1), unless the authorization is terminated  or revoked sooner.       Influenza A by PCR NEGATIVE NEGATIVE Final   Influenza B by PCR NEGATIVE NEGATIVE Final    Comment: (NOTE) The Xpert Xpress SARS-CoV-2/FLU/RSV plus assay is intended as an aid in the diagnosis of influenza from Nasopharyngeal swab specimens and should not be used as a sole basis for treatment. Nasal washings and aspirates are unacceptable for Xpert Xpress SARS-CoV-2/FLU/RSV testing.  Fact Sheet for Patients: BloggerCourse.com  Fact Sheet for Healthcare Providers: SeriousBroker.it  This test is not yet approved or cleared by the [place] FDA and has been authorized for detection and/or diagnosis of SARS-CoV-2 by FDA under an Emergency Use Authorization (EUA). This EUA will remain in effect (meaning this test can be used) for the duration of the COVID-19 declaration under Section 564(b)(1) of the Act, 21 U.S.C. section 360bbb-3(b)(1), unless the authorization is terminated or revoked.     Resp Syncytial Virus by PCR NEGATIVE NEGATIVE Final    Comment: (NOTE) Fact Sheet for Patients: BloggerCourse.com  Fact Sheet for Healthcare Providers: SeriousBroker.it  This test is not yet approved or cleared by the [place] FDA and has been authorized for detection and/or diagnosis of SARS-CoV-2 by FDA under an Emergency Use  Authorization (EUA). This EUA will remain in effect (meaning this test can be used) for the duration of the COVID-19 declaration under Section 564(b)(1) of the Act, 21 U.S.C. section 360bbb-3(b)(1), unless the authorization is terminated or revoked.  Performed at [facility], [address], [place], [place] [address]     Radiology Studies: DG CHEST PORT 1 VIEW Result Date: 12/03/2023 CLINICAL  DATA:  [id number] with shortness of breath. EXAM: PORTABLE CHEST 1 VIEW COMPARISON:  Portable chest yesterday at 9:07 a.m. FINDINGS: 5:05 a.m. Stable mild cardiomegaly, mild central vascular prominence. Interstitial and patchy hazy opacities continue to be noted in the lower lung fields consistent with edema, pneumonia or combination. Small pleural effusions appear similar. The upper lung fields remain generally clear. Stable mediastinum with aortic tortuosity and atherosclerosis. No new osseous findings. Compare: Overall aeration seems unchanged. Left chest loop recorder device. IMPRESSION: 1. No significant change in aeration since yesterday. 2. Interstitial and patchy hazy opacities continue to be noted in the lower lung fields consistent with edema, pneumonia or combination. 3. Small pleural effusions. Electronically Signed   By: Almira Bar M.D.   On: 12/03/2023 08:02   DG CHEST PORT 1 VIEW Result Date: 12/02/2023 CLINICAL DATA:  Shortness of breath EXAM: PORTABLE CHEST 1 VIEW COMPARISON:  10/31/2023, 11/30/2023 FINDINGS: Left chest wall loop recorder. Stable heart size. Aortic atherosclerosis. Increased interstitial markings with progressive streaky right basilar opacity. No pleural effusion or pneumothorax. IMPRESSION: Increased interstitial markings with progressive streaky right basilar opacity, suspicious for developing pneumonia. Electronically Signed   By: Duanne Guess D.O.   On: 12/02/2023 13:10   Scheduled Meds:  buPROPion  150 mg Oral Daily   carvedilol  6.25 mg Oral BID    fenofibrate  54 mg Oral Daily   furosemide  40 mg Intravenous Once   gabapentin  300 mg Oral TID   hydrALAZINE  10 mg Oral Q12H   insulin aspart  0-15 Units Subcutaneous TID WC   insulin aspart  0-5 Units Subcutaneous QHS   isosorbide mononitrate  15 mg Oral Daily   rivaroxaban  20 mg Oral Q supper   rosuvastatin  20 mg Oral Daily   spironolactone  12.5 mg Oral Daily   Continuous Infusions:  azithromycin Stopped (12/03/23 1019)   cefTRIAXone (ROCEPHIN)  IV Stopped (12/03/23 0912)    LOS: 2 days   Marguerita Merles, DO [facility] Available via Epic secure chat 7am-7pm After these hours, please refer to coverage provider listed on [email or web address] 12/03/2023, 6:06 PM

## 2023-12-03 NOTE — TOC Progression Note (Signed)
Transition of Care [facility]) - Progression Note    Patient Details  Name: RHODY MUSLEH MRN: [id number] Date of Birth: 12/11/1941  [facility] CM/SW Contact  Otelia Santee, LCSW Phone Number: 12/03/2023, 9:59 AM  Clinical Narrative:    Insurance auth still pending for SNF.   Expected Discharge Plan: Skilled Nursing Facility Barriers to Discharge: Continued Medical Work up, SNF Pending bed offer  Expected Discharge Plan and Services In-house Referral: Clinical Social Work Discharge Planning Services: NA Post Acute Care Choice: Skilled Nursing Facility Living arrangements for the past 2 months: Single Family Home                 DME Arranged: N/A DME Agency: NA                   Social Determinants of Health (SDOH) Interventions SDOH Screenings   Food Insecurity: No Food Insecurity (11/30/2023)  Housing: Patient Declined (11/30/2023)  Transportation Needs: No Transportation Needs (11/30/2023)  Utilities: Not At Risk (11/30/2023)  Alcohol Screen: Low Risk  (11/24/2023)  Depression (PHQ2-9): Low Risk  (11/24/2023)  Financial Resource Strain: Low Risk  (11/24/2023)  Physical Activity: Inactive (11/24/2023)  Social Connections: Socially Integrated (11/24/2023)  Stress: Stress Concern Present (11/24/2023)  Tobacco Use: Medium Risk (11/30/2023)  Health Literacy: Adequate Health Literacy (11/24/2023)    Readmission Risk Interventions     No data to display

## 2023-12-04 ENCOUNTER — Inpatient Hospital Stay (HOSPITAL_COMMUNITY): Payer: Medicare Other

## 2023-12-04 DIAGNOSIS — D649 Anemia, unspecified: Secondary | ICD-10-CM

## 2023-12-04 DIAGNOSIS — J189 Pneumonia, unspecified organism: Secondary | ICD-10-CM | POA: Diagnosis not present

## 2023-12-04 DIAGNOSIS — J9601 Acute respiratory failure with hypoxia: Secondary | ICD-10-CM | POA: Diagnosis not present

## 2023-12-04 LAB — IRON AND TIBC
Iron: 36 ug/dL — ABNORMAL LOW (ref 45–182)
Saturation Ratios: 21 % (ref 17.9–39.5)
TIBC: 171 ug/dL — ABNORMAL LOW (ref 250–450)
UIBC: 135 ug/dL

## 2023-12-04 LAB — COMPREHENSIVE METABOLIC PANEL
ALT: 14 U/L (ref 0–44)
AST: 21 U/L (ref 15–41)
Albumin: 2.1 g/dL — ABNORMAL LOW (ref 3.5–5.0)
Alkaline Phosphatase: 65 U/L (ref 38–126)
Anion gap: 6 (ref 5–15)
BUN: 27 mg/dL — ABNORMAL HIGH (ref 8–23)
CO2: 30 mmol/L (ref 22–32)
Calcium: 8.2 mg/dL — ABNORMAL LOW (ref 8.9–10.3)
Chloride: 99 mmol/L (ref 98–111)
Creatinine, Ser: 1.1 mg/dL (ref 0.61–1.24)
GFR, Estimated: >60 mL/min (ref >60)
Glucose, Bld: 123 mg/dL — ABNORMAL HIGH (ref 70–99)
Potassium: 3.6 mmol/L (ref 3.5–5.1)
Sodium: 135 mmol/L (ref 135–145)
Total Bilirubin: 0.6 mg/dL (ref <1.2)
Total Protein: 4.6 g/dL — ABNORMAL LOW (ref 6.5–8.1)

## 2023-12-04 LAB — MAGNESIUM: Magnesium: 1.9 mg/dL (ref 1.7–2.4)

## 2023-12-04 LAB — CBC WITH DIFFERENTIAL/PLATELET
Abs Immature Granulocytes: 0.07 10*3/uL (ref 0.00–0.07)
Basophils Absolute: 0.1 10*3/uL (ref 0.0–0.1)
Basophils Relative: 1 %
Eosinophils Absolute: 0.3 10*3/uL (ref 0.0–0.5)
Eosinophils Relative: 4 %
HCT: 28.4 % — ABNORMAL LOW (ref 39.0–52.0)
Hemoglobin: 9.6 g/dL — ABNORMAL LOW (ref 13.0–17.0)
Immature Granulocytes: 1 %
Lymphocytes Relative: 13 %
Lymphs Abs: 1.2 10*3/uL (ref 0.7–4.0)
MCH: 31.2 pg (ref 26.0–34.0)
MCHC: 33.8 g/dL (ref 30.0–36.0)
MCV: 92.2 fL (ref 80.0–100.0)
Monocytes Absolute: 1 10*3/uL (ref 0.1–1.0)
Monocytes Relative: 11 %
Neutro Abs: 6.6 10*3/uL (ref 1.7–7.7)
Neutrophils Relative %: 70 %
Platelets: 200 10*3/uL (ref 150–400)
RBC: 3.08 MIL/uL — ABNORMAL LOW (ref 4.22–5.81)
RDW: 14.3 % (ref 11.5–15.5)
WBC: 9.3 10*3/uL (ref 4.0–10.5)
nRBC: 0 % (ref 0.0–0.2)

## 2023-12-04 LAB — RETICULOCYTES
Immature Retic Fract: 14.9 % (ref 2.3–15.9)
RBC.: 3.08 MIL/uL — ABNORMAL LOW (ref 4.22–5.81)
Retic Count, Absolute: 71.8 10*3/uL (ref 19.0–186.0)
Retic Ct Pct: 2.3 % (ref 0.4–3.1)

## 2023-12-04 LAB — GLUCOSE, CAPILLARY
Glucose-Capillary: 103 mg/dL — ABNORMAL HIGH (ref 70–99)
Glucose-Capillary: 145 mg/dL — ABNORMAL HIGH (ref 70–99)
Glucose-Capillary: 137 mg/dL — ABNORMAL HIGH (ref 70–99)
Glucose-Capillary: 161 mg/dL — ABNORMAL HIGH (ref 70–99)

## 2023-12-04 LAB — FOLATE: Folate: 12.5 ng/mL (ref >5.9)

## 2023-12-04 LAB — PHOSPHORUS: Phosphorus: 2.7 mg/dL (ref 2.5–4.6)

## 2023-12-04 LAB — VITAMIN B12: Vitamin B-12: 873 pg/mL (ref 180–914)

## 2023-12-04 LAB — FERRITIN: Ferritin: 761 ng/mL — ABNORMAL HIGH (ref 24–336)

## 2023-12-04 MED ORDER — FUROSEMIDE 10 MG/ML IJ SOLN
40.0000 mg | Freq: Once | INTRAMUSCULAR | Status: AC
  Administered 2023-12-04: 40 mg via INTRAVENOUS
  Filled 2023-12-04: qty 4

## 2023-12-04 NOTE — Plan of Care (Signed)

## 2023-12-04 NOTE — Progress Notes (Signed)
PROGRESS NOTE    Nathan Fernandez  WGN:[id number] DOB: 13-Feb-1941 DOA: 11/30/2023 PCP: Joaquim Nam, MD   Brief Narrative:  Nathan Fernandez is a 82 y.o. male with medical history significant for CAD, COPD on room air, non-insulin-dependent type 2 diabetes, history of DVT on Xarelto being admitted to the hospital with community-acquired pneumonia.  He lives independently in the community with his wife, who came with him to [facility] this morning, complaining of multiple falls over the past week due to weakness in his left arm and leg.  He has been gradually worsening.  He has some chronic cervical radiculopathy, is on Flexeril was on oxycodone for this previously.  Oxycodone was recently discontinued due to some mild confusion.  Indicates, patient tells me that he has had some worsening of his chronic cough.  Due to the persistent weakness, he came to the ER for evaluation, was found on CT scan to have evidence of possible community-acquired pneumonia.  He was started on empiric IV antibiotics, he was placed on 4 L nasal cannula oxygen due to hypoxia.   **CXR today showed ongoing stable ventilation as well as emphysema with persistent basilar predominant interstitial opacity favored to be interstitial edema.  Will continue diuresis and give him an additional dose this evening.  Continues to desaturate on ambulatory home O2 screen but his oxygen requirements are weaning and he feels better daily  Assessment and Plan:  Sepsis and Acute Hypoxic Respiratory Failure due to CAP and Acute on Chronic Diastolic CHF:  -Meeting criteria with tachycardia, leukocytosis, source is community-acquired pneumonia.   -Continue CTX, Azithromycin. Checked PCT and was <0.10.  -Given his cardiac history, could be a significant component of cardiogenic pulmonary edema.  -Leukocytosis Trend slightly : Recent Labs  Lab 11/13/23 1155 11/30/23 1019 12/01/23 0444 12/02/23 0515 12/03/23 0540 12/04/23 0545  WBC  8.7 12.9* 9.8 10.6* 9.3 9.3  -Continue supplemental oxygen via nasal cannula wean O2 as tolerated-continuous pulse oximetry maintain O2 saturation greater than 90% SpO2: 96 % O2 Flow Rate (L/min): 2 L/min -Repeat CXR this AM done below -Patient will need an Ambulatory home O2 screen prior to discharge and repeat chest x-ray in a.m.  -Patient desaturated on his ambulatory home O2 screen today again and requires at least 2 L oxygen but his oxygen requirement is weaning.  Will continue with antibiotics and diuresis at this time and continue to wean and attempt to get him    Acute on Chronic HFrEF CAD HLD -LVEF 40-45%, Global Hypokinesis with known CAD that is not amenable to PCI, per coronary angiography Oct 2024.  -Mildly elevated troponin this admit without chest pain can be expected in this setting.  -BNP elevated to 475.3, CHF suggested on chest CT with pleural effusion. -Initiated diuresis with IV Lasix 40 mg daily and he is improving slowly.  Will give him another dose tonight in addition to the dose he received this AM. -Strict I's and O's and daily weights;  Intake/Output Summary (Last 24 hours) at 12/04/2023 1458 Last data filed at 12/04/2023 1218 Gross per 24 hour  Intake 600 ml  Output 500 ml  Net 100 ml  -Continue Rosuvastatin 20 mg po Daily -CXR this Am done and showed "Ongoing stable ventilation. Emphysema with persistent basilar predominant interstitial opacity favored to be interstitial edema." -Repeat CXR in the AM   Type 2 Diabetes Mellitus  -Well-controlled, not insulin-dependent -C/w Carb Controlled Diet -C/w Moderate Novolog Sliding Scale Insulin AC/HS -CBG Trend:  Recent  Labs  Lab 12/02/23 2148 12/03/23 0808 12/03/23 1152 12/03/23 1658 12/03/23 2034 12/04/23 0807 12/04/23 1159  GLUCAP 140* 133* 156* 118* 144* 103* 145*    Hypertension -Continue home Spironolactone 12.5 mg po Daily, Isosorbide Mononitrate 15 mg po Daily and Carvedilol 6.25 mg po  BID -Received IV Furosemide 40 mg x1 yesterday -Continue to Monitor BP per Protocol  -Last BP reading was 115/73   History of DVT -Continue Anticoagulation with Rivaroxaban 20 mg po Daily    Neuropathy -C/w Gabapentin 300 mg po TID and Cyclobenzaprine 5 mg po TIDprn Muscle Spasms -Recent MRI Cervical Spine done and showed "Multilevel cervical spondylosis, worst at C6-7 where there is moderate spinal canal stenosis and severe left and moderate right neural foraminal narrowing. Multilevel moderate to severe neural foraminal narrowing, as detailed above. Extensive T2 hyperintense marrow signal in the left C6 articular pillar with indistinct cortical margins on T1 weighted images. This could represent advanced degenerative change; however, recommend correlation with cervical spine CT to exclude the possibility of a destructive, marrow replacing bone lesion." -Discussed with Neurosurgery NP Leo Grosser who feels that the patient would just benefit from outpatient follow-up and workup but will review his scans just in case   Generalized Weakness -Presumably due to his community-acquired pneumonia, continue PT/OT  Normocytic Anemia -Hgb/Hct Trend: Recent Labs  Lab 11/13/23 1155 11/30/23 1019 12/01/23 0444 12/02/23 0515 12/03/23 0540 12/04/23 0545  HGB 12.5* 11.9* 10.2* 11.0* 10.5* 9.6*  HCT 38.1* 35.4* 31.1* 33.5* 32.3* 28.4*  MCV 93.3 91.2 94.5 94.6 93.4 92.2  -Checked Anemia Panel and showed an iron level of 36, UIBC of 135, TIBC 171, saturation ratios of 21%, ferritin level 761, folate levels 12.5 and vitamin B12 873 -Continue to Monitor for S/Sx of Bleeding; No overt bleeding noted -Repeat CBC in the AM   Hypoalbuminemia -Patient's Albumin Trend: Recent Labs  Lab 11/13/23 1155 12/02/23 0513 12/03/23 0540 12/04/23 0545  ALBUMIN 3.1* 2.2* 2.2* 2.1*  -Continue to Monitor and Trend and repeat CMP in the AM   DVT prophylaxis:  rivaroxaban (XARELTO) tablet 20 mg    Code Status:  Full Code Family Communication: No family currently at bedside  Disposition Plan:  Level of care: Med-Surg Status is: Inpatient Remains inpatient appropriate because: His further clinical improvement and continued oxygen weaning   Consultants:  None but discussing with Neurosurgery who will evaluate his scans  Procedures:  As delineated as above  Antimicrobials:  Anti-infectives (From admission, onward)    Start     Dose/Rate Route Frequency Ordered Stop   12/01/23 1000  azithromycin (ZITHROMAX) 500 mg in sodium chloride 0.9 % 250 mL IVPB        500 mg 250 mL/hr over 60 Minutes Intravenous Every 24 hours 11/30/23 1725 12/04/23 1126   12/01/23 1000  cefTRIAXone (ROCEPHIN) 1 g in sodium chloride 0.9 % 100 mL IVPB        1 g 200 mL/hr over 30 Minutes Intravenous Every 24 hours 11/30/23 1725 12/04/23 1008   11/30/23 1315  cefTRIAXone (ROCEPHIN) 1 g in sodium chloride 0.9 % 100 mL IVPB        1 g 200 mL/hr over 30 Minutes Intravenous  Once 11/30/23 1308 11/30/23 1500   11/30/23 1315  doxycycline (VIBRA-TABS) tablet 100 mg        100 mg Oral  Once 11/30/23 1308 11/30/23 1346       Subjective: Seen and examined at bedside and his respiratory status continues to improve but he  continues to desaturate on ambulatory home O2 screen.  He denied any specific neuropathic pain or issues.  Thinks he is doing okay.  No nausea or vomiting.  No other concerns or complaints at this time.  Objective: Vitals:   12/03/23 1949 12/04/23 0502 12/04/23 0746 12/04/23 1308  BP: (!) 133/103 108/66 (!) 154/86 115/73  Pulse: (!) 106 84 87 82  Resp: 15 15 18 16   Temp: 98.6 F (37 C) 98.3 F (36.8 C) 98.3 F (36.8 C) 97.9 F (36.6 C)  TempSrc:      SpO2: 94% 95% 99% 96%  Weight:      Height:        Intake/Output Summary (Last 24 hours) at 12/04/2023 1503 Last data filed at 12/04/2023 1218 Gross per 24 hour  Intake 600 ml  Output 500 ml  Net 100 ml   Filed Weights   11/30/23 1014 12/02/23  0524 12/03/23 0500  Weight: 70 kg 74.8 kg 73.7 kg   Examination: Physical Exam:  Constitutional: Thin elderly Caucasian male in no acute distress Respiratory: Diminished to auscultation bilaterally with coarse breath sounds and has some slight rhonchi and crackles but no appreciable wheezing or rales. Normal respiratory effort and patient is not tachypenic. No accessory muscle use.  Unlabored breathing Cardiovascular: RRR, no murmurs / rubs / gallops. S1 and S2 auscultated. No extremity edema. Abdomen: Soft, non-tender, non-distended. Bowel sounds positive.  GU: Deferred. Musculoskeletal: No clubbing / cyanosis of digits/nails. No joint deformity upper and lower extremities. Skin: No rashes, lesions, ulcers on a limited skin evaluation. No induration; Warm and dry.  Neurologic: CN 2-12 grossly intact with no focal deficits. Romberg sign and cerebellar reflexes not assessed.  Psychiatric: Normal judgment and insight. Alert and oriented x 3. Normal mood and appropriate affect.   Data Reviewed: I have personally reviewed following labs and imaging studies  CBC: Recent Labs  Lab 11/30/23 1019 12/01/23 0444 12/02/23 0515 12/03/23 0540 12/04/23 0545  WBC 12.9* 9.8 10.6* 9.3 9.3  NEUTROABS  --   --   --  7.4 6.6  HGB 11.9* 10.2* 11.0* 10.5* 9.6*  HCT 35.4* 31.1* 33.5* 32.3* 28.4*  MCV 91.2 94.5 94.6 93.4 92.2  PLT 161 143* 180 201 200   Basic Metabolic Panel: Recent Labs  Lab 11/30/23 1019 12/01/23 0444 12/02/23 0513 12/02/23 0515 12/03/23 0540 12/04/23 0545  NA 137 137  --  133* 136 135  K 4.2 3.9  --  3.6 3.6 3.6  CL 102 106  --  100 100 99  CO2 26 22  --  26 25 30   GLUCOSE 168* 136*  --  132* 142* 123*  BUN 27* 23  --  25* 27* 27*  CREATININE 1.00 0.83  --  0.96 0.94 1.10  CALCIUM 8.6* 8.1*  --  8.2* 8.3* 8.2*  MG  --   --  1.9  --  1.8 1.9  PHOS  --   --  2.9  --  2.9 2.7   GFR: Estimated Creatinine Clearance: 50.9 mL/min (by C-G formula based on SCr of 1.1  mg/dL). Liver Function Tests: Recent Labs  Lab 12/02/23 0513 12/03/23 0540 12/04/23 0545  AST 23 23 21   ALT 13 14 14   ALKPHOS 76 74 65  BILITOT 0.6 0.7 0.6  PROT 5.1* 5.1* 4.6*  ALBUMIN 2.2* 2.2* 2.1*   No results for input(s): "LIPASE", "AMYLASE" in the last 168 hours. No results for input(s): "AMMONIA" in the last 168 hours. Coagulation Profile: No results  for input(s): "INR", "PROTIME" in the last 168 hours. Cardiac Enzymes: No results for input(s): "CKTOTAL", "CKMB", "CKMBINDEX", "TROPONINI" in the last 168 hours. BNP (last 3 results) No results for input(s): "PROBNP" in the last 8760 hours. HbA1C: No results for input(s): "HGBA1C" in the last 72 hours. CBG: Recent Labs  Lab 12/03/23 1152 12/03/23 1658 12/03/23 2034 12/04/23 0807 12/04/23 1159  GLUCAP 156* 118* 144* 103* 145*   Lipid Profile: No results for input(s): "CHOL", "HDL", "LDLCALC", "TRIG", "CHOLHDL", "LDLDIRECT" in the last 72 hours. Thyroid Function Tests: No results for input(s): "TSH", "T4TOTAL", "FREET4", "T3FREE", "THYROIDAB" in the last 72 hours. Anemia Panel: Recent Labs    12/04/23 0545  VITAMINB12 873  FOLATE 12.5  FERRITIN 761*  TIBC 171*  IRON 36*  RETICCTPCT 2.3   Sepsis Labs: Recent Labs  Lab 11/30/23 1804 11/30/23 2059 12/02/23 0515  PROCALCITON  --   --  <0.10  LATICACIDVEN 1.2 1.2  --    Recent Results (from the past 240 hours)  Resp panel by RT-PCR (RSV, Flu A&B, Covid) Anterior Nasal Swab     Status: None   Collection Time: 11/30/23 10:19 AM   Specimen: Anterior Nasal Swab  Result Value Ref Range Status   SARS Coronavirus 2 by RT PCR NEGATIVE NEGATIVE Final    Comment: (NOTE) SARS-CoV-2 target nucleic acids are NOT DETECTED.  The SARS-CoV-2 RNA is generally detectable in upper respiratory specimens during the acute phase of infection. The lowest concentration of SARS-CoV-2 viral copies this assay can detect is 138 copies/mL. A negative result does not preclude  SARS-Cov-2 infection and should not be used as the sole basis for treatment or other patient management decisions. A negative result may occur with  improper specimen collection/handling, submission of specimen other than nasopharyngeal swab, presence of viral mutation(s) within the areas targeted by this assay, and inadequate number of viral copies(<138 copies/mL). A negative result must be combined with clinical observations, patient history, and epidemiological information. The expected result is Negative.  Fact Sheet for Patients:  BloggerCourse.com  Fact Sheet for Healthcare Providers:  SeriousBroker.it  This test is no t yet approved or cleared by the [place] FDA and  has been authorized for detection and/or diagnosis of SARS-CoV-2 by FDA under an Emergency Use Authorization (EUA). This EUA will remain  in effect (meaning this test can be used) for the duration of the COVID-19 declaration under Section 564(b)(1) of the Act, 21 U.S.C.section 360bbb-3(b)(1), unless the authorization is terminated  or revoked sooner.       Influenza A by PCR NEGATIVE NEGATIVE Final   Influenza B by PCR NEGATIVE NEGATIVE Final    Comment: (NOTE) The Xpert Xpress SARS-CoV-2/FLU/RSV plus assay is intended as an aid in the diagnosis of influenza from Nasopharyngeal swab specimens and should not be used as a sole basis for treatment. Nasal washings and aspirates are unacceptable for Xpert Xpress SARS-CoV-2/FLU/RSV testing.  Fact Sheet for Patients: BloggerCourse.com  Fact Sheet for Healthcare Providers: SeriousBroker.it  This test is not yet approved or cleared by the [place] FDA and has been authorized for detection and/or diagnosis of SARS-CoV-2 by FDA under an Emergency Use Authorization (EUA). This EUA will remain in effect (meaning this test can be used) for the duration of  the COVID-19 declaration under Section 564(b)(1) of the Act, 21 U.S.C. section 360bbb-3(b)(1), unless the authorization is terminated or revoked.     Resp Syncytial Virus by PCR NEGATIVE NEGATIVE Final    Comment: (NOTE) Fact Sheet  for Patients: BloggerCourse.com  Fact Sheet for Healthcare Providers: SeriousBroker.it  This test is not yet approved or cleared by the [place] FDA and has been authorized for detection and/or diagnosis of SARS-CoV-2 by FDA under an Emergency Use Authorization (EUA). This EUA will remain in effect (meaning this test can be used) for the duration of the COVID-19 declaration under Section 564(b)(1) of the Act, 21 U.S.C. section 360bbb-3(b)(1), unless the authorization is terminated or revoked.  Performed at [facility], [address] [place], [place] [address]     Radiology Studies: DG CHEST PORT 1 VIEW Result Date: 12/04/2023 CLINICAL DATA:  82 year old male with shortness of breath. EXAM: PORTABLE CHEST 1 VIEW COMPARISON:  Portable chest yesterday and earlier. FINDINGS: Portable AP semi upright view at 0511 hours. Emphysema with asymmetric pulmonary interstitial opacity on CT 11/30/2023 suspected to be interstitial edema. Stable lung volumes and mediastinal contours from yesterday. Left chest wall loop recorder or ICD. Stable ventilation from yesterday. Unresolved basilar predominant interstitial opacity. No pneumothorax, pleural effusion or consolidation identified. IMPRESSION: Ongoing stable ventilation. Emphysema with persistent basilar predominant interstitial opacity favored to be interstitial edema. Electronically Signed   By: Odessa Fleming M.D.   On: 12/04/2023 08:36   DG CHEST PORT 1 VIEW Result Date: 12/03/2023 CLINICAL DATA:  [id number] with shortness of breath. EXAM: PORTABLE CHEST 1 VIEW COMPARISON:  Portable chest yesterday at 9:07 a.m. FINDINGS: 5:05 a.m. Stable mild cardiomegaly,  mild central vascular prominence. Interstitial and patchy hazy opacities continue to be noted in the lower lung fields consistent with edema, pneumonia or combination. Small pleural effusions appear similar. The upper lung fields remain generally clear. Stable mediastinum with aortic tortuosity and atherosclerosis. No new osseous findings. Compare: Overall aeration seems unchanged. Left chest loop recorder device. IMPRESSION: 1. No significant change in aeration since yesterday. 2. Interstitial and patchy hazy opacities continue to be noted in the lower lung fields consistent with edema, pneumonia or combination. 3. Small pleural effusions. Electronically Signed   By: Almira Bar M.D.   On: 12/03/2023 08:02   Scheduled Meds:  buPROPion  150 mg Oral Daily   carvedilol  6.25 mg Oral BID   fenofibrate  54 mg Oral Daily   gabapentin  300 mg Oral TID   hydrALAZINE  10 mg Oral Q12H   insulin aspart  0-15 Units Subcutaneous TID WC   insulin aspart  0-5 Units Subcutaneous QHS   isosorbide mononitrate  15 mg Oral Daily   rivaroxaban  20 mg Oral Q supper   rosuvastatin  20 mg Oral Daily   spironolactone  12.5 mg Oral Daily   Continuous Infusions:   LOS: 3 days   Marguerita Merles, DO [facility] Available via Epic secure chat 7am-7pm After these hours, please refer to coverage provider listed on [email or web address] 12/04/2023, 3:03 PM

## 2023-12-04 NOTE — TOC Progression Note (Signed)
Transition of Care [facility]) - Progression Note    Patient Details  Name: Nathan Fernandez MRN: [id number] Date of Birth: 09/28/1941  Transition of [facility]) CM/SW Contact  Otelia Santee, LCSW Phone Number: 12/04/2023, 3:19 PM  Clinical Narrative:    Pt's insurance auth approved from 12/12 to 12/16. Auth ID: [id number]. Plan ID: [id number].    Expected Discharge Plan: Skilled Nursing Facility Barriers to Discharge: Continued Medical Work up, SNF Pending bed offer  Expected Discharge Plan and Services In-house Referral: Clinical Social Work Discharge Planning Services: NA Post Acute Care Choice: Skilled Nursing Facility Living arrangements for the past 2 months: Single Family Home                 DME Arranged: N/A DME Agency: NA                   Social Determinants of Health (SDOH) Interventions SDOH Screenings   Food Insecurity: No Food Insecurity (11/30/2023)  Housing: Patient Declined (11/30/2023)  Transportation Needs: No Transportation Needs (11/30/2023)  Utilities: Not At Risk (11/30/2023)  Alcohol Screen: Low Risk  (11/24/2023)  Depression (PHQ2-9): Low Risk  (11/24/2023)  Financial Resource Strain: Low Risk  (11/24/2023)  Physical Activity: Inactive (11/24/2023)  Social Connections: Socially Integrated (11/24/2023)  Stress: Stress Concern Present (11/24/2023)  Tobacco Use: Medium Risk (11/30/2023)  Health Literacy: Adequate Health Literacy (11/24/2023)    Readmission Risk Interventions     No data to display

## 2023-12-04 NOTE — Progress Notes (Signed)
SATURATION QUALIFICATIONS: (This note is used to comply with regulatory documentation for home oxygen)  Patient Saturations on Room Air at Rest = 90 %  Patient Saturations on Room Air while Ambulating = 85-872%  Patient Saturations on 2 Liters of oxygen while Ambulating = 95%  Please briefly explain why patient needs home oxygen: The patient ambulated a total of 160 feet.  He began ambulating on room air 90 %  and after ambulating 80 feet he complained of feeling short of breath. Oxygen saturation decreased 85%, O2 administered 2 liters nasal canula and O2 sat increased 93-95%.

## 2023-12-05 ENCOUNTER — Inpatient Hospital Stay (HOSPITAL_COMMUNITY): Payer: Medicare Other

## 2023-12-05 DIAGNOSIS — K922 Gastrointestinal hemorrhage, unspecified: Secondary | ICD-10-CM | POA: Diagnosis not present

## 2023-12-05 DIAGNOSIS — D649 Anemia, unspecified: Secondary | ICD-10-CM | POA: Diagnosis not present

## 2023-12-05 DIAGNOSIS — J189 Pneumonia, unspecified organism: Secondary | ICD-10-CM | POA: Diagnosis not present

## 2023-12-05 DIAGNOSIS — J9601 Acute respiratory failure with hypoxia: Secondary | ICD-10-CM | POA: Diagnosis not present

## 2023-12-05 LAB — COMPREHENSIVE METABOLIC PANEL
ALT: 15 U/L (ref 0–44)
AST: 24 U/L (ref 15–41)
Albumin: 2.3 g/dL — ABNORMAL LOW (ref 3.5–5.0)
Alkaline Phosphatase: 74 U/L (ref 38–126)
Anion gap: 4 — ABNORMAL LOW (ref 5–15)
BUN: 26 mg/dL — ABNORMAL HIGH (ref 8–23)
CO2: 31 mmol/L (ref 22–32)
Calcium: 8.6 mg/dL — ABNORMAL LOW (ref 8.9–10.3)
Chloride: 100 mmol/L (ref 98–111)
Creatinine, Ser: 0.93 mg/dL (ref 0.61–1.24)
GFR, Estimated: >60 mL/min (ref >60)
Glucose, Bld: 122 mg/dL — ABNORMAL HIGH (ref 70–99)
Potassium: 4.4 mmol/L (ref 3.5–5.1)
Sodium: 135 mmol/L (ref 135–145)
Total Bilirubin: 0.7 mg/dL (ref <1.2)
Total Protein: 5.2 g/dL — ABNORMAL LOW (ref 6.5–8.1)

## 2023-12-05 LAB — GLUCOSE, CAPILLARY
Glucose-Capillary: 121 mg/dL — ABNORMAL HIGH (ref 70–99)
Glucose-Capillary: 153 mg/dL — ABNORMAL HIGH (ref 70–99)
Glucose-Capillary: 116 mg/dL — ABNORMAL HIGH (ref 70–99)
Glucose-Capillary: 179 mg/dL — ABNORMAL HIGH (ref 70–99)

## 2023-12-05 LAB — HEMOGLOBIN AND HEMATOCRIT, BLOOD
HCT: 30.2 % — ABNORMAL LOW (ref 39.0–52.0)
Hemoglobin: 10.3 g/dL — ABNORMAL LOW (ref 13.0–17.0)

## 2023-12-05 LAB — CBC WITH DIFFERENTIAL/PLATELET
Abs Immature Granulocytes: 0.04 10*3/uL (ref 0.00–0.07)
Basophils Absolute: 0.1 10*3/uL (ref 0.0–0.1)
Basophils Relative: 1 %
Eosinophils Absolute: 0.6 10*3/uL — ABNORMAL HIGH (ref 0.0–0.5)
Eosinophils Relative: 5 %
HCT: 32.9 % — ABNORMAL LOW (ref 39.0–52.0)
Hemoglobin: 11.1 g/dL — ABNORMAL LOW (ref 13.0–17.0)
Immature Granulocytes: 0 %
Lymphocytes Relative: 12 %
Lymphs Abs: 1.2 10*3/uL (ref 0.7–4.0)
MCH: 31.5 pg (ref 26.0–34.0)
MCHC: 33.7 g/dL (ref 30.0–36.0)
MCV: 93.5 fL (ref 80.0–100.0)
Monocytes Absolute: 1 10*3/uL (ref 0.1–1.0)
Monocytes Relative: 10 %
Neutro Abs: 7.7 10*3/uL (ref 1.7–7.7)
Neutrophils Relative %: 72 %
Platelets: 241 10*3/uL (ref 150–400)
RBC: 3.52 MIL/uL — ABNORMAL LOW (ref 4.22–5.81)
RDW: 14.4 % (ref 11.5–15.5)
WBC: 10.6 10*3/uL — ABNORMAL HIGH (ref 4.0–10.5)
nRBC: 0 % (ref 0.0–0.2)

## 2023-12-05 LAB — MAGNESIUM: Magnesium: 2 mg/dL (ref 1.7–2.4)

## 2023-12-05 LAB — OCCULT BLOOD X 1 CARD TO LAB, STOOL: Fecal Occult Bld: POSITIVE — AB

## 2023-12-05 LAB — PHOSPHORUS: Phosphorus: 2.6 mg/dL (ref 2.5–4.6)

## 2023-12-05 MED ORDER — HYDROCORTISONE ACETATE 25 MG RE SUPP
25.0000 mg | Freq: Two times a day (BID) | RECTAL | Status: DC
Start: 2023-12-05 — End: 2023-12-11
  Administered 2023-12-05 – 2023-12-11 (×12): 25 mg via RECTAL
  Filled 2023-12-05 (×13): qty 1

## 2023-12-05 MED ORDER — FUROSEMIDE 10 MG/ML IJ SOLN
40.0000 mg | Freq: Once | INTRAMUSCULAR | Status: AC
  Administered 2023-12-05: 40 mg via INTRAVENOUS
  Filled 2023-12-05: qty 4

## 2023-12-05 MED ORDER — HYDROCORTISONE 0.5 % EX CREA
TOPICAL_CREAM | Freq: Two times a day (BID) | CUTANEOUS | Status: DC | PRN
  Administered 2023-12-08: 1 via TOPICAL
  Filled 2023-12-05: qty 28.35

## 2023-12-05 NOTE — Progress Notes (Signed)
PROGRESS NOTE    Nathan Fernandez  ZOX:[id number] DOB: September 30, 1941 DOA: 11/30/2023 PCP: Joaquim Nam, MD   Brief Narrative:  Nathan Fernandez is a 82 y.o. male with medical history significant for CAD, COPD on room air, non-insulin-dependent type 2 diabetes, history of DVT on Xarelto being admitted to the hospital with community-acquired pneumonia.  He lives independently in the community with his wife, who came with him to [facility] this morning, complaining of multiple falls over the past week due to weakness in his left arm and leg.  He has been gradually worsening.  He has some chronic cervical radiculopathy, is on Flexeril was on oxycodone for this previously.  Oxycodone was recently discontinued due to some mild confusion.  Indicates, patient tells me that he has had some worsening of his chronic cough.  Due to the persistent weakness, he came to the ER for evaluation, was found on CT scan to have evidence of possible community-acquired pneumonia.  He was started on empiric IV antibiotics, he was placed on 4 L nasal cannula oxygen due to hypoxia.   **CXR yesterday showed ongoing stable ventilation as well as emphysema with persistent basilar predominant interstitial opacity favored to be interstitial edema.  Will continue diuresis again and give him an additional dose this evening.  Continues to desaturate on ambulatory home O2 screen but his oxygen requirements are weaning and he feels better daily.  This a.m. he started having some GI bleeding and bright red blood per his rectum.  FOBT was positive and anticoagulation has been held.  Likely had a hemorrhoidal bleed given his recent colonoscopy about 3 years ago but will consult GI for further evaluation monitor his hemoglobin and hematocrit carefully.  Assessment and Plan:  Sepsis and Acute Hypoxic Respiratory Failure due to CAP and Acute on Chronic Diastolic CHF:  -Meeting criteria with tachycardia, leukocytosis, source is  community-acquired pneumonia.   -Continue CTX, Azithromycin. Checked PCT and was <0.10.  -Given his cardiac history, could be a significant component of cardiogenic pulmonary edema.  -Leukocytosis Trend slightly : Recent Labs  Lab 11/13/23 1155 11/30/23 1019 12/01/23 0444 12/02/23 0515 12/03/23 0540 12/04/23 0545 12/05/23 0649  WBC 8.7 12.9* 9.8 10.6* 9.3 9.3 10.6*  -Continue supplemental oxygen via nasal cannula wean O2 as tolerated-continuous pulse oximetry maintain O2 saturation greater than 90% SpO2: 96 % O2 Flow Rate (L/min): 2 L/min -Repeat CXR this AM done below -Patient will need an Ambulatory home O2 screen prior to discharge and repeat chest x-ray in a.m.  -Patient desaturated on his ambulatory home O2 screen today yesterday and requires at least 2 L oxygen but his oxygen requirement is weaning.  -Will continue with antibiotics and diuresis at this time and continue to wean and attempt to get him off of Oxygen   Acute on Chronic HFrEF CAD HLD -LVEF 40-45%, Global Hypokinesis with known CAD that is not amenable to PCI, per coronary angiography Oct 2024.  -Mildly elevated troponin this admit without chest pain can be expected in this setting.  -BNP elevated to 475.3, CHF suggested on chest CT with pleural effusion. -Initiated diuresis with IV Lasix 40 mg daily and he is improving slowly.  Will give him another dose tonight in addition to the dose he received this AM. -Strict I's and O's and daily weights;  Intake/Output Summary (Last 24 hours) at 12/05/2023 1733 Last data filed at 12/05/2023 1400 Gross per 24 hour  Intake --  Output 1500 ml  Net -1500 ml  -  Continue Rosuvastatin 20 mg po Daily -CXR this Am done yesterday and showed "Ongoing stable ventilation. Emphysema with persistent basilar predominant interstitial opacity favored to be interstitial edema." -CXR this AM done and showed "Mild hazy patchy opacities throughout the right lung, most prominent in the lower  right lung, similar, compatible with multifocal pneumonia." -Repeat CXR in the AM   Type 2 Diabetes Mellitus  -Well-controlled, not insulin-dependent -C/w Carb Controlled Diet -C/w Moderate Novolog Sliding Scale Insulin AC/HS -CBG Trend:  Recent Labs  Lab 12/04/23 0807 12/04/23 1159 12/04/23 1540 12/04/23 2047 12/05/23 0752 12/05/23 1209 12/05/23 1632  GLUCAP 103* 145* 137* 161* 121* 153* 116*    Hypertension -Continue home Spironolactone 12.5 mg po Daily, Isosorbide Mononitrate 15 mg po Daily and Carvedilol 6.25 mg po BID -Received IV Furosemide 40 mg x1 this a.m. and will get another dose this p.m. -Continue to Monitor BP per Protocol  -Last BP reading was 142/86   History of DVT -Currently holding anticoagulation with Rivaroxaban 20 mg po Daily given his FOBT positive GI bleeding   Neuropathy -C/w Gabapentin 300 mg po TID and Cyclobenzaprine 5 mg po TIDprn Muscle Spasms -Recent MRI Cervical Spine done and showed "Multilevel cervical spondylosis, worst at C6-7 where there is moderate spinal canal stenosis and severe left and moderate right neural foraminal narrowing. Multilevel moderate to severe neural foraminal narrowing, as detailed above. Extensive T2 hyperintense marrow signal in the left C6 articular pillar with indistinct cortical margins on T1 weighted images. This could represent advanced degenerative change; however, recommend correlation with cervical spine CT to exclude the possibility of a destructive, marrow replacing bone lesion." -Discussed with Neurosurgery NP Leo Grosser who feels that the patient would just benefit from outpatient follow-up and workup but will review his scans just in case -Patient has outpatient follow-up with Dr. Wynetta Emery and January 6 and will continue to maintain this and follow-up with them   Generalized Weakness -Presumably due to his community-acquired pneumonia, continue PT/OT  Normocytic Anemia with now lower GI bleeding -Hgb/Hct  Trend: Recent Labs  Lab 11/30/23 1019 12/01/23 0444 12/02/23 0515 12/03/23 0540 12/04/23 0545 12/05/23 0649 12/05/23 0924  HGB 11.9* 10.2* 11.0* 10.5* 9.6* 11.1* 10.3*  HCT 35.4* 31.1* 33.5* 32.3* 28.4* 32.9* 30.2*  MCV 91.2 94.5 94.6 93.4 92.2 93.5  --   -Checked Anemia Panel and showed an iron level of 36, UIBC of 135, TIBC 171, saturation ratios of 21%, ferritin level 761, folate levels 12.5 and vitamin B12 873 -Hold his anticoagulation at this time given that he has rectal bleeding and FOBT is positive; likely hemorrhoidal given that he has history of constipation -Continue to Monitor for S/Sx of Bleeding; will discuss and consult GI for further evaluation -Repeat CBC in the AM   Hypoalbuminemia -Patient's Albumin Trend: Recent Labs  Lab 11/13/23 1155 12/02/23 0513 12/03/23 0540 12/04/23 0545 12/05/23 0649  ALBUMIN 3.1* 2.2* 2.2* 2.1* 2.3*  -Continue to Monitor and Trend and repeat CMP in the AM   DVT prophylaxis: Holding anticoagulation tonight given the GI bleeding    Code Status: Full Code Family Communication: Discussed with the patient's wife over the telephone  Disposition Plan:  Level of care: Med-Surg Status is: Inpatient Remains inpatient appropriate because: His further clinical improvement in his respiratory status and evaluation of his back red blood per rectum   Consultants:  Discussed with neurosurgery about his scans Gastroenterology  Procedures:  As delineated as above  Antimicrobials:  Anti-infectives (From admission, onward)    Start  Dose/Rate Route Frequency Ordered Stop   12/01/23 1000  azithromycin (ZITHROMAX) 500 mg in sodium chloride 0.9 % 250 mL IVPB        500 mg 250 mL/hr over 60 Minutes Intravenous Every 24 hours 11/30/23 1725 12/04/23 1126   12/01/23 1000  cefTRIAXone (ROCEPHIN) 1 g in sodium chloride 0.9 % 100 mL IVPB        1 g 200 mL/hr over 30 Minutes Intravenous Every 24 hours 11/30/23 1725 12/04/23 1008   11/30/23 1315   cefTRIAXone (ROCEPHIN) 1 g in sodium chloride 0.9 % 100 mL IVPB        1 g 200 mL/hr over 30 Minutes Intravenous  Once 11/30/23 1308 11/30/23 1500   11/30/23 1315  doxycycline (VIBRA-TABS) tablet 100 mg        100 mg Oral  Once 11/30/23 1308 11/30/23 1346       Subjective: Seen and examined at bedside and his respiratory status continues to improve but he noticed some blood in his stool today.  Also had some bleeding and streaking with some blood fell on the floor.  Denies any specific neuropathic pain or issues.  Thinks he is doing okay and denies any nausea or vomiting.  No other concerns or complaints this time.  Objective: Vitals:   12/04/23 1308 12/04/23 2004 12/05/23 0518 12/05/23 0919  BP: 115/73 135/89 (!) 141/86 (!) 142/86  Pulse: 82 87 91 96  Resp: 16 15 15    Temp: 97.9 F (36.6 C) 97.9 F (36.6 C) 98.3 F (36.8 C)   TempSrc:   Oral   SpO2: 96% 95% 97% 96%  Weight:   72.9 kg   Height:        Intake/Output Summary (Last 24 hours) at 12/05/2023 1736 Last data filed at 12/05/2023 1400 Gross per 24 hour  Intake --  Output 1500 ml  Net -1500 ml   Filed Weights   12/02/23 0524 12/03/23 0500 12/05/23 0518  Weight: 74.8 kg 73.7 kg 72.9 kg   Examination: Physical Exam:  Constitutional: Thin elderly Caucasian male in no acute distress Respiratory: Diminished to auscultation bilaterally, no wheezing, rales, rhonchi or crackles. Normal respiratory effort and patient is not tachypenic. No accessory muscle use.  Unlabored breathing Cardiovascular: RRR, no murmurs / rubs / gallops. S1 and S2 auscultated. No extremity edema.  Abdomen: Soft, non-tender, nondistended.  Bowel sounds present  Musculoskeletal: No clubbing / cyanosis of digits/nails. No joint deformity upper and lower extremities.  Neurologic: CN 2-12 grossly intact with no focal deficits. Romberg sign and cerebellar reflexes not assessed.  Psychiatric: Normal judgment and insight. Alert and oriented x 3. Normal  mood and appropriate affect.   Data Reviewed: I have personally reviewed following labs and imaging studies  CBC: Recent Labs  Lab 12/01/23 0444 12/02/23 0515 12/03/23 0540 12/04/23 0545 12/05/23 0649 12/05/23 0924  WBC 9.8 10.6* 9.3 9.3 10.6*  --   NEUTROABS  --   --  7.4 6.6 7.7  --   HGB 10.2* 11.0* 10.5* 9.6* 11.1* 10.3*  HCT 31.1* 33.5* 32.3* 28.4* 32.9* 30.2*  MCV 94.5 94.6 93.4 92.2 93.5  --   PLT 143* 180 201 200 241  --    Basic Metabolic Panel: Recent Labs  Lab 12/01/23 0444 12/02/23 0513 12/02/23 0515 12/03/23 0540 12/04/23 0545 12/05/23 0649  NA 137  --  133* 136 135 135  K 3.9  --  3.6 3.6 3.6 4.4  CL 106  --  100 100 99 100  CO2 22  --  26 25 30 31   GLUCOSE 136*  --  132* 142* 123* 122*  BUN 23  --  25* 27* 27* 26*  CREATININE 0.83  --  0.96 0.94 1.10 0.93  CALCIUM 8.1*  --  8.2* 8.3* 8.2* 8.6*  MG  --  1.9  --  1.8 1.9 2.0  PHOS  --  2.9  --  2.9 2.7 2.6   GFR: Estimated Creatinine Clearance: 60.2 mL/min (by C-G formula based on SCr of 0.93 mg/dL). Liver Function Tests: Recent Labs  Lab 12/02/23 0513 12/03/23 0540 12/04/23 0545 12/05/23 0649  AST 23 23 21 24   ALT 13 14 14 15   ALKPHOS 76 74 65 74  BILITOT 0.6 0.7 0.6 0.7  PROT 5.1* 5.1* 4.6* 5.2*  ALBUMIN 2.2* 2.2* 2.1* 2.3*   No results for input(s): "LIPASE", "AMYLASE" in the last 168 hours. No results for input(s): "AMMONIA" in the last 168 hours. Coagulation Profile: No results for input(s): "INR", "PROTIME" in the last 168 hours. Cardiac Enzymes: No results for input(s): "CKTOTAL", "CKMB", "CKMBINDEX", "TROPONINI" in the last 168 hours. BNP (last 3 results) No results for input(s): "PROBNP" in the last 8760 hours. HbA1C: No results for input(s): "HGBA1C" in the last 72 hours. CBG: Recent Labs  Lab 12/04/23 1540 12/04/23 2047 12/05/23 0752 12/05/23 1209 12/05/23 1632  GLUCAP 137* 161* 121* 153* 116*   Lipid Profile: No results for input(s): "CHOL", "HDL", "LDLCALC",  "TRIG", "CHOLHDL", "LDLDIRECT" in the last 72 hours. Thyroid Function Tests: No results for input(s): "TSH", "T4TOTAL", "FREET4", "T3FREE", "THYROIDAB" in the last 72 hours. Anemia Panel: Recent Labs    12/04/23 0545  VITAMINB12 873  FOLATE 12.5  FERRITIN 761*  TIBC 171*  IRON 36*  RETICCTPCT 2.3   Sepsis Labs: Recent Labs  Lab 11/30/23 1804 11/30/23 2059 12/02/23 0515  PROCALCITON  --   --  <0.10  LATICACIDVEN 1.2 1.2  --    Recent Results (from the past 240 hours)  Resp panel by RT-PCR (RSV, Flu A&B, Covid) Anterior Nasal Swab     Status: None   Collection Time: 11/30/23 10:19 AM   Specimen: Anterior Nasal Swab  Result Value Ref Range Status   SARS Coronavirus 2 by RT PCR NEGATIVE NEGATIVE Final    Comment: (NOTE) SARS-CoV-2 target nucleic acids are NOT DETECTED.  The SARS-CoV-2 RNA is generally detectable in upper respiratory specimens during the acute phase of infection. The lowest concentration of SARS-CoV-2 viral copies this assay can detect is 138 copies/mL. A negative result does not preclude SARS-Cov-2 infection and should not be used as the sole basis for treatment or other patient management decisions. A negative result may occur with  improper specimen collection/handling, submission of specimen other than nasopharyngeal swab, presence of viral mutation(s) within the areas targeted by this assay, and inadequate number of viral copies(<138 copies/mL). A negative result must be combined with clinical observations, patient history, and epidemiological information. The expected result is Negative.  Fact Sheet for Patients:  BloggerCourse.com  Fact Sheet for Healthcare Providers:  SeriousBroker.it  This test is no t yet approved or cleared by the [place] FDA and  has been authorized for detection and/or diagnosis of SARS-CoV-2 by FDA under an Emergency Use Authorization (EUA). This EUA will remain  in  effect (meaning this test can be used) for the duration of the COVID-19 declaration under Section 564(b)(1) of the Act, 21 U.S.C.section 360bbb-3(b)(1), unless the authorization is terminated  or revoked sooner.  Influenza A by PCR NEGATIVE NEGATIVE Final   Influenza B by PCR NEGATIVE NEGATIVE Final    Comment: (NOTE) The Xpert Xpress SARS-CoV-2/FLU/RSV plus assay is intended as an aid in the diagnosis of influenza from Nasopharyngeal swab specimens and should not be used as a sole basis for treatment. Nasal washings and aspirates are unacceptable for Xpert Xpress SARS-CoV-2/FLU/RSV testing.  Fact Sheet for Patients: BloggerCourse.com  Fact Sheet for Healthcare Providers: SeriousBroker.it  This test is not yet approved or cleared by the [place] FDA and has been authorized for detection and/or diagnosis of SARS-CoV-2 by FDA under an Emergency Use Authorization (EUA). This EUA will remain in effect (meaning this test can be used) for the duration of the COVID-19 declaration under Section 564(b)(1) of the Act, 21 U.S.C. section 360bbb-3(b)(1), unless the authorization is terminated or revoked.     Resp Syncytial Virus by PCR NEGATIVE NEGATIVE Final    Comment: (NOTE) Fact Sheet for Patients: BloggerCourse.com  Fact Sheet for Healthcare Providers: SeriousBroker.it  This test is not yet approved or cleared by the [place] FDA and has been authorized for detection and/or diagnosis of SARS-CoV-2 by FDA under an Emergency Use Authorization (EUA). This EUA will remain in effect (meaning this test can be used) for the duration of the COVID-19 declaration under Section 564(b)(1) of the Act, 21 U.S.C. section 360bbb-3(b)(1), unless the authorization is terminated or revoked.  Performed at [facility], [address], [place], [place] [address]      Radiology Studies: DG CHEST PORT 1 VIEW Result Date: 12/05/2023 CLINICAL DATA:  [id number] SOB (shortness of breath) [id number] EXAM: PORTABLE CHEST 1 VIEW COMPARISON:  Chest radiograph from one day prior. FINDINGS: Loop recorder overlies the medial left chest. Stable cardiomediastinal silhouette with normal heart size. No pneumothorax. No pleural effusion. Mild hazy patchy opacities throughout the right lung, most prominent in the lower right lung, similar. IMPRESSION: Mild hazy patchy opacities throughout the right lung, most prominent in the lower right lung, similar, compatible with multifocal pneumonia. Electronically Signed   By: Delbert Phenix M.D.   On: 12/05/2023 09:56   DG CHEST PORT 1 VIEW Result Date: 12/04/2023 CLINICAL DATA:  82 year old male with shortness of breath. EXAM: PORTABLE CHEST 1 VIEW COMPARISON:  Portable chest yesterday and earlier. FINDINGS: Portable AP semi upright view at 0511 hours. Emphysema with asymmetric pulmonary interstitial opacity on CT 11/30/2023 suspected to be interstitial edema. Stable lung volumes and mediastinal contours from yesterday. Left chest wall loop recorder or ICD. Stable ventilation from yesterday. Unresolved basilar predominant interstitial opacity. No pneumothorax, pleural effusion or consolidation identified. IMPRESSION: Ongoing stable ventilation. Emphysema with persistent basilar predominant interstitial opacity favored to be interstitial edema. Electronically Signed   By: Odessa Fleming M.D.   On: 12/04/2023 08:36   Scheduled Meds:  buPROPion  150 mg Oral Daily   carvedilol  6.25 mg Oral BID   fenofibrate  54 mg Oral Daily   furosemide  40 mg Intravenous Once   gabapentin  300 mg Oral TID   hydrALAZINE  10 mg Oral Q12H   hydrocortisone  25 mg Rectal BID   insulin aspart  0-15 Units Subcutaneous TID WC   insulin aspart  0-5 Units Subcutaneous QHS   isosorbide mononitrate  15 mg Oral Daily   rosuvastatin  20 mg Oral Daily   spironolactone  12.5 mg Oral  Daily   Continuous Infusions:   LOS: 4 days   Marguerita Merles, DO [facility] Available  via Epic secure chat 7am-7pm After these hours, please refer to coverage provider listed on [email or web address] 12/05/2023, 5:36 PM

## 2023-12-05 NOTE — Progress Notes (Signed)
Mobility Specialist - Progress Note   12/05/23 1155  Mobility  Activity Ambulated with assistance in hallway  Level of Assistance Standby assist, set-up cues, supervision of patient - no hands on  Assistive Device Front wheel walker  Distance Ambulated (ft) 380 ft  Range of Motion/Exercises Active  Activity Response Tolerated well  Mobility Referral Yes  Mobility visit 1 Mobility  Mobility Specialist Start Time (ACUTE ONLY) 1139  Mobility Specialist Stop Time (ACUTE ONLY) 1154  Mobility Specialist Time Calculation (min) (ACUTE ONLY) 15 min   Received in bed and agreed to mobility. Had no issues throughout session and returned to chair with all needs met.  Eura Radabaugh Christell Constant Mobility Specialist ,

## 2023-12-06 ENCOUNTER — Encounter (HOSPITAL_COMMUNITY): Payer: Self-pay | Admitting: Internal Medicine

## 2023-12-06 ENCOUNTER — Inpatient Hospital Stay (HOSPITAL_COMMUNITY): Payer: Medicare Other

## 2023-12-06 DIAGNOSIS — J189 Pneumonia, unspecified organism: Secondary | ICD-10-CM | POA: Diagnosis not present

## 2023-12-06 DIAGNOSIS — R310 Gross hematuria: Secondary | ICD-10-CM

## 2023-12-06 DIAGNOSIS — K922 Gastrointestinal hemorrhage, unspecified: Secondary | ICD-10-CM | POA: Diagnosis not present

## 2023-12-06 DIAGNOSIS — K649 Unspecified hemorrhoids: Secondary | ICD-10-CM | POA: Diagnosis not present

## 2023-12-06 DIAGNOSIS — J9601 Acute respiratory failure with hypoxia: Secondary | ICD-10-CM | POA: Diagnosis not present

## 2023-12-06 DIAGNOSIS — K5909 Other constipation: Secondary | ICD-10-CM | POA: Diagnosis not present

## 2023-12-06 DIAGNOSIS — D649 Anemia, unspecified: Secondary | ICD-10-CM | POA: Diagnosis not present

## 2023-12-06 DIAGNOSIS — Z7901 Long term (current) use of anticoagulants: Secondary | ICD-10-CM

## 2023-12-06 DIAGNOSIS — K625 Hemorrhage of anus and rectum: Secondary | ICD-10-CM

## 2023-12-06 DIAGNOSIS — K5900 Constipation, unspecified: Secondary | ICD-10-CM

## 2023-12-06 LAB — CBC WITH DIFFERENTIAL/PLATELET
Abs Immature Granulocytes: 0.05 10*3/uL (ref 0.00–0.07)
Basophils Absolute: 0.1 10*3/uL (ref 0.0–0.1)
Basophils Relative: 1 %
Eosinophils Absolute: 0.3 10*3/uL (ref 0.0–0.5)
Eosinophils Relative: 4 %
HCT: 30.2 % — ABNORMAL LOW (ref 39.0–52.0)
Hemoglobin: 10.2 g/dL — ABNORMAL LOW (ref 13.0–17.0)
Immature Granulocytes: 1 %
Lymphocytes Relative: 12 %
Lymphs Abs: 1.1 10*3/uL (ref 0.7–4.0)
MCH: 30.9 pg (ref 26.0–34.0)
MCHC: 33.8 g/dL (ref 30.0–36.0)
MCV: 91.5 fL (ref 80.0–100.0)
Monocytes Absolute: 0.9 10*3/uL (ref 0.1–1.0)
Monocytes Relative: 10 %
Neutro Abs: 6.6 10*3/uL (ref 1.7–7.7)
Neutrophils Relative %: 72 %
Platelets: 224 10*3/uL (ref 150–400)
RBC: 3.3 MIL/uL — ABNORMAL LOW (ref 4.22–5.81)
RDW: 13.9 % (ref 11.5–15.5)
WBC: 9 10*3/uL (ref 4.0–10.5)
nRBC: 0 % (ref 0.0–0.2)

## 2023-12-06 LAB — URINALYSIS, COMPLETE (UACMP) WITH MICROSCOPIC
Bacteria, UA: NONE SEEN
Bilirubin Urine: NEGATIVE
Glucose, UA: NEGATIVE mg/dL
Ketones, ur: NEGATIVE mg/dL
Leukocytes,Ua: NEGATIVE
Nitrite: NEGATIVE
Protein, ur: 100 mg/dL — AB
Specific Gravity, Urine: 1.006 (ref 1.005–1.030)
pH: 7 (ref 5.0–8.0)

## 2023-12-06 LAB — COMPREHENSIVE METABOLIC PANEL
ALT: 14 U/L (ref 0–44)
AST: 22 U/L (ref 15–41)
Albumin: 2.2 g/dL — ABNORMAL LOW (ref 3.5–5.0)
Alkaline Phosphatase: 75 U/L (ref 38–126)
Anion gap: 7 (ref 5–15)
BUN: 25 mg/dL — ABNORMAL HIGH (ref 8–23)
CO2: 30 mmol/L (ref 22–32)
Calcium: 8.8 mg/dL — ABNORMAL LOW (ref 8.9–10.3)
Chloride: 97 mmol/L — ABNORMAL LOW (ref 98–111)
Creatinine, Ser: 0.85 mg/dL (ref 0.61–1.24)
GFR, Estimated: >60 mL/min (ref >60)
Glucose, Bld: 146 mg/dL — ABNORMAL HIGH (ref 70–99)
Potassium: 3.8 mmol/L (ref 3.5–5.1)
Sodium: 134 mmol/L — ABNORMAL LOW (ref 135–145)
Total Bilirubin: 0.9 mg/dL (ref <1.2)
Total Protein: 5.1 g/dL — ABNORMAL LOW (ref 6.5–8.1)

## 2023-12-06 LAB — PHOSPHORUS: Phosphorus: 3 mg/dL (ref 2.5–4.6)

## 2023-12-06 LAB — GLUCOSE, CAPILLARY
Glucose-Capillary: 139 mg/dL — ABNORMAL HIGH (ref 70–99)
Glucose-Capillary: 167 mg/dL — ABNORMAL HIGH (ref 70–99)
Glucose-Capillary: 150 mg/dL — ABNORMAL HIGH (ref 70–99)
Glucose-Capillary: 164 mg/dL — ABNORMAL HIGH (ref 70–99)

## 2023-12-06 LAB — MAGNESIUM: Magnesium: 1.9 mg/dL (ref 1.7–2.4)

## 2023-12-06 MED ORDER — IOHEXOL 300 MG/ML  SOLN
100.0000 mL | Freq: Once | INTRAMUSCULAR | Status: AC | PRN
  Administered 2023-12-06: 100 mL via INTRAVENOUS

## 2023-12-06 MED ORDER — RIVAROXABAN 20 MG PO TABS
20.0000 mg | ORAL_TABLET | Freq: Every day | ORAL | Status: DC
Start: 2023-12-06 — End: 2023-12-07

## 2023-12-06 MED ORDER — POLYETHYLENE GLYCOL 3350 17 G PO PACK
17.0000 g | PACK | Freq: Two times a day (BID) | ORAL | Status: DC
Start: 2023-12-06 — End: 2023-12-11
  Administered 2023-12-06 – 2023-12-10 (×9): 17 g via ORAL
  Filled 2023-12-06 (×9): qty 1

## 2023-12-06 MED ORDER — FUROSEMIDE 10 MG/ML IJ SOLN
40.0000 mg | Freq: Once | INTRAMUSCULAR | Status: AC
  Administered 2023-12-06: 40 mg via INTRAVENOUS
  Filled 2023-12-06: qty 4

## 2023-12-06 NOTE — Consult Note (Signed)
Consultation  Referring Provider:     Carin Hock Primary Care Physician:  Joaquim Nam, MD Primary Gastroenterologist:        Was Dr. Vincent Peyer of [facility] - they have specifically requested [facility] as they have left [facility], I take care of his wife Reason for Consultation:     rectal bleeding         HPI:   Nathan Fernandez is a 82 y.o. male with significant comorbidities to include CAD, CHF, COPD, diabetes, history of DVT on Xarelto, who was admitted for community-acquired pneumonia.  He also has cervical radiculopathy and was on oxycodone previously.  He was admitted to the hospital on 9 December when he presented with weakness and found to have community-acquired pneumonia, started on antibiotics, given oxygen.  Since has been in the hospital his respiratory status has improved.  He had some rectal bleeding with bowel movement yesterday, stool is heme positive, we will call to evaluate.  Patient states he is had significant constipation, can sometimes go up to a week and a half without a bowel movement.  I also called the patient's wife Nathan Fernandez on the phone and she confirms the history.  He has not had any rectal bleeding at home at baseline.  He does strain with bowel habits and passes hard stools, does not go frequently.  He states since has been in the hospital his bowels have been moving better.  In talking with his nurse, he had scant blood noted streaking the stool yesterday.  He normally has no anemia but he has had slight downtrend in his hemoglobin since November timeframe or so.  Since he has been in the hospital with his pneumonia his hemoglobin has drifted slightly down to 10.2 today, MCV 91.  He has had a hemoglobin of around 10 since he has been admitted to the hospital.  B12 and folate are normal, iron level is low, but TIBC is low and ferritin is Elevated and has been so for the past few years (700s).  Iron studies consistent with that of chronic disease.  He denies any  rectal pain.  He denies any abdominal pain.  His last colonoscopy was with Dr. Matthias Hughs of [facility] in 2019.  The exam was normal at that time.  Patient is hemodynamically stable, he is not have any significant bleeding from what I can tell.  Had a good conversation with the patient and his wife about how aggressive they wanted to be with evaluation of this.  DRE done at bedside with nursing staff today.   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   Hosp R facial weakness   CAD (coronary artery disease)    MI, Hosp 1993, PTCA; LHC 04/18/13: Distal left main 40%, proximal LAD with several aneurysmal segments, proximal LAD 50% prior to and after aneurysmal segments, area does not appear to flow-limiting, proximal diagonal 70%, ostial circumflex 20%, proximal circumflex 20%, OM1 30-40%, proximal RCA 40%, mid RCA 30%, distal RCA 20%, EF 50% => med Rx   Cancer (HCC)    skin cancer - 2020   COPD (chronic obstructive pulmonary disease) (HCC)    DM2 (diabetes mellitus, type 2) (HCC)    DVT (deep venous thrombosis) (HCC) 12/2021   History of ETT 1998   wnl   History of hiatal hernia    History of MRI 08/04/2009   brain- atrophy sm vess dz   HLD (hyperlipidemia)  HTN (hypertension)    Myocardial infarction (HCC)    OA (osteoarthritis)    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HCC)    05/09/12 [place]: 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, large collateral proximal to aneurysm   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   Stroke [facility])     Past Surgical History:  Procedure Laterality Date   BUBBLE STUDY  05/15/2020   Procedure: BUBBLE STUDY;  Surgeon: Chrystie Nose, MD;  Location: [facility] ENDOSCOPY;  Service: Cardiovascular;;   CARDIAC CATHETERIZATION     cataract surgery  08/2005   repair lens which moved   EYE SURGERY     LEFT HEART CATH AND CORONARY ANGIOGRAPHY N/A 01/21/2019   Procedure: LEFT HEART CATH AND  CORONARY ANGIOGRAPHY;  Surgeon: Kathleene Hazel, MD;  Location: [facility] INVASIVE CV LAB;  Service: Cardiovascular;  Laterality: N/A;   LEFT HEART CATH AND CORONARY ANGIOGRAPHY N/A 02/13/2021   Procedure: LEFT HEART CATH AND CORONARY ANGIOGRAPHY;  Surgeon: Iran Ouch, MD;  Location: [facility] INVASIVE CV LAB;  Service: Cardiovascular;  Laterality: N/A;   LOOP RECORDER INSERTION N/A 05/15/2020   Procedure: LOOP RECORDER INSERTION;  Surgeon: Hillis Range, MD;  Location: [facility] INVASIVE CV LAB;  Service: Cardiovascular;  Laterality: N/A;   LUMBAR LAMINECTOMY/DECOMPRESSION MICRODISCECTOMY N/A 06/19/2021   Procedure: Lumbar Two-Three, Lumbar Three-Four, Lumbar Four-Five Laminectomy and Foraminotomy;  Surgeon: Donalee Citrin, MD;  Location[facility] OR;  Service: Neurosurgery;  Laterality: N/A;   LUMBAR LAMINECTOMY/DECOMPRESSION MICRODISCECTOMY Bilateral 07/28/2022   Procedure: Sublaminar decompression - Lumbar two-Lumbar three - bilateral redo;  Surgeon: Donalee Citrin, MD;  Location: [facility] OR;  Service: Neurosurgery;  Laterality: Bilateral;  3C   RIGHT/LEFT HEART CATH AND CORONARY ANGIOGRAPHY N/A 09/29/2023   Procedure: RIGHT/LEFT HEART CATH AND CORONARY ANGIOGRAPHY;  Surgeon: Elder Negus, MD;  Location: [facility] INVASIVE CV LAB;  Service: Cardiovascular;  Laterality: N/A;   stress myoview  06/29/2006   sm distal anteroseptal & apical infarct   TEE WITHOUT CARDIOVERSION N/A 05/15/2020   Procedure: TRANSESOPHAGEAL ECHOCARDIOGRAM (TEE);  Surgeon: Chrystie Nose, MD;  Location: [facility] ENDOSCOPY;  Service: Cardiovascular;  Laterality: N/A;   THYROIDECTOMY, PARTIAL  1967   B9 growth   TONSILLECTOMY      Family History  Problem Relation Age of Onset   Hypertension Mother    Aneurysm Mother    Stroke Mother    Hypertension Father    Heart disease Father        CAD   Heart disease Son        MI   Colon cancer Neg Hx    Prostate cancer Neg Hx      Social History   Tobacco Use   Smoking status: Former    Types:  Cigars   Smokeless tobacco: Never   Tobacco comments:    cigar occassionally  Vaping Use   Vaping status: Never Used  Substance Use Topics   Alcohol use: Yes    Comment: occasional   Drug use: No    Prior to Admission medications   Medication Sig Start Date End Date Taking? Authorizing Provider  albuterol (VENTOLIN HFA) 108 (90 Base) MCG/ACT inhaler Inhale 1-2 puffs into the lungs every 6 (six) hours as needed (COPD). 05/01/22  Yes Joaquim Nam, MD  Ascorbic Acid (VITAMIN C PO) Take 1 tablet by mouth daily.   Yes [provider]  aspirin EC 81 MG tablet Take 81 mg by mouth  daily. Swallow whole.   Yes [provider]  B Complex CAPS Take 1 capsule by mouth daily.   Yes [provider]  buPROPion (WELLBUTRIN SR) 150 MG 12 hr tablet Take 1 tablet (150 mg total) by mouth daily. 11/13/23  Yes Joaquim Nam, MD  carvedilol (COREG) 6.25 MG tablet TAKE 1 TABLET BY MOUTH TWICE A DAY 11/08/23  Yes Joaquim Nam, MD  Cholecalciferol (VITAMIN D-3 PO) Take 1 capsule by mouth daily.   Yes [provider]  Cyanocobalamin (B-12 PO) Take 1 tablet by mouth daily.   Yes [provider]  dorzolamide-timolol (COSOPT) 22.3-6.8 MG/ML ophthalmic solution Place 1 drop into both eyes 2 (two) times daily. 02/13/20  Yes [provider]  ELDERBERRY PO Take 1 capsule by mouth in the morning and at bedtime.   Yes [provider]  fenofibrate (TRICOR) 48 MG tablet TAKE 1 TABLET BY MOUTH EVERY DAY 11/10/23  Yes Joaquim Nam, MD  gabapentin (NEURONTIN) 100 MG capsule Take 100 mg by mouth daily.   Yes [provider]  Glucosamine-Chondroitin (GLUCOSAMINE CHONDR COMPLEX PO) Take 1 tablet by mouth daily.   Yes [provider]  hydrALAZINE (APRESOLINE) 10 MG tablet TAKE 1 TABLET BY MOUTH EVERY 12 HOURS. 07/22/23  Yes Wendall Stade, MD  isosorbide mononitrate (IMDUR) 30 MG 24 hr tablet TAKE 1/2 TABLET BY MOUTH DAILY 07/22/23  Yes  Wendall Stade, MD  ketoconazole (NIZORAL) 2 % cream APPLY 1 APPLICATION TOPICALLY DAILY AS NEEDED FOR IRRITATION. 03/05/23  Yes Joaquim Nam, MD  lidocaine (XYLOCAINE) 5 % ointment Apply 1 Application topically as needed. 10/31/23  Yes Glyn Ade, MD  nitroGLYCERIN (NITROSTAT) 0.4 MG SL tablet Place 1 tablet (0.4 mg total) under the tongue every 5 (five) minutes as needed for chest pain. Up to 3 doses 07/22/23  Yes Swinyer, Zachary George, NP  polyethylene glycol powder (GLYCOLAX/MIRALAX) 17 GM/SCOOP powder Take 17 g by mouth daily as needed. 07/23/23  Yes Joaquim Nam, MD  rosuvastatin (CRESTOR) 20 MG tablet TAKE 1 TABLET BY MOUTH EVERY DAY 11/08/23  Yes Joaquim Nam, MD  traZODone (DESYREL) 100 MG tablet Take 1-2 tablets (100-200 mg total) by mouth at bedtime. Patient taking differently: Take 100 mg by mouth at bedtime. 11/13/23  Yes Joaquim Nam, MD  VITAMIN D PO Take 1 tablet by mouth daily.   Yes [provider]  XARELTO 20 MG TABS tablet TAKE 1 TABLET BY MOUTH DAILY WITH SUPPER. 05/14/23  Yes Joaquim Nam, MD  cyclobenzaprine (FLEXERIL) 5 MG tablet Take 1 tablet (5 mg total) by mouth 3 (three) times daily as needed for muscle spasms. Patient not taking: Reported on 12/01/2023 11/22/23   Joaquim Nam, MD  oxyCODONE-acetaminophen (PERCOCET/ROXICET) 5-325 MG tablet Take 0.5-1 tablets by mouth every 6 (six) hours as needed for severe pain (pain score 7-10) (sedation caution.). Patient not taking: Reported on 12/01/2023 11/13/23   Joaquim Nam, MD    Current Facility-Administered Medications  Medication Dose Route Frequency Provider Last Rate Last Admin   acetaminophen (TYLENOL) tablet 650 mg  650 mg Oral Q6H PRN Kirby Crigler, Mir M, MD   650 mg at 12/05/23 0981   Or   acetaminophen (TYLENOL) suppository 650 mg  650 mg Rectal Q6H PRN Kirby Crigler, Mir M, MD       albuterol (PROVENTIL) (2.5 MG/3ML) 0.083% nebulizer solution 2.5 mg  2.5 mg Nebulization Q2H PRN  Maryln Gottron, MD  buPROPion (WELLBUTRIN SR) 12 hr tablet 150 mg  150 mg Oral Daily Kirby Crigler, Mir M, MD   150 mg at 12/05/23 9528   carvedilol (COREG) tablet 6.25 mg  6.25 mg Oral BID Kirby Crigler, Mir M, MD   6.25 mg at 12/05/23 2138   cyclobenzaprine (FLEXERIL) tablet 5 mg  5 mg Oral TID PRN Maryln Gottron, MD   5 mg at 12/04/23 0935   fenofibrate tablet 54 mg  54 mg Oral Daily Kirby Crigler, Mir M, MD   54 mg at 12/05/23 4132   gabapentin (NEURONTIN) capsule 300 mg  300 mg Oral TID Maryln Gottron, MD   300 mg at 12/05/23 2138   hydrALAZINE (APRESOLINE) tablet 10 mg  10 mg Oral Q12H Kirby Crigler, Mir M, MD   10 mg at 12/05/23 2138   hydrocortisone (ANUSOL-HC) suppository 25 mg  25 mg Rectal BID Marguerita Merles Latif, DO   25 mg at 12/05/23 2138   hydrocortisone cream 0.5 %   Topical BID PRN Anthoney Harada, NP       insulin aspart (novoLOG) injection 0-15 Units  0-15 Units Subcutaneous TID WC Kirby Crigler, Mir M, MD   3 Units at 12/05/23 1227   insulin aspart (novoLOG) injection 0-5 Units  0-5 Units Subcutaneous QHS Kirby Crigler, Mir M, MD       isosorbide mononitrate (IMDUR) 24 hr tablet 15 mg  15 mg Oral Daily Kirby Crigler, Mir M, MD   15 mg at 12/05/23 0924   ondansetron (ZOFRAN) tablet 4 mg  4 mg Oral Q6H PRN Kirby Crigler, Mir M, MD       Or   ondansetron [facility]) injection 4 mg  4 mg Intravenous Q6H PRN Kirby Crigler, Mir M, MD       polyethylene glycol (MIRALAX / GLYCOLAX) packet 17 g  17 g Oral Daily PRN Kirby Crigler, Mir M, MD       rosuvastatin (CRESTOR) tablet 20 mg  20 mg Oral Daily Kirby Crigler, Mir M, MD   20 mg at 12/05/23 4401   spironolactone (ALDACTONE) tablet 12.5 mg  12.5 mg Oral Daily Kirby Crigler, Mir M, MD   12.5 mg at 12/05/23 0272   traZODone (DESYREL) tablet 50 mg  50 mg Oral QHS PRN Maryln Gottron, MD   50 mg at 12/02/23 2215    Allergies as of 11/30/2023 - Review Complete 11/30/2023  Allergen Reaction Noted   Ace inhibitors Swelling 12/29/2017   Angiotensin receptor  blockers Swelling 12/29/2017   Vasotec [enalapril] Swelling 12/29/2017   Pravachol [pravastatin] Other (See Comments) 07/23/2022     Review of Systems:    As per HPI, otherwise negative    Physical Exam:  Vital signs in last 24 hours: Temp:  [97.5 F (36.4 C)-98.2 F (36.8 C)] 98.2 F (36.8 C) (12/15 0532) Pulse Rate:  [89-96] 89 (12/15 0532) Resp:  [16] 16 (12/15 0532) BP: (142-170)/(86-94) 147/92 (12/15 0532) SpO2:  [96 %] 96 % (12/15 0532) Weight:  [70.6 kg] 70.6 kg (12/15 0500) Last BM Date : 12/05/23 General:   Pleasant male in NAD, wearing oxygen Head:  Normocephalic and atraumatic. Eyes:   No icterus.   Conjunctiva pink. Ears:  Normal auditory acuity. Neck:  Supple Lungs:  Respirations even and unlabored.  Heart:  Regular rate and rhythm Abdomen:  Soft, nondistended, nontender. . No appreciable masses or hepatomegaly.  Rectal:  no obvious anal fissure, brown stool noted in the vault, small hemorrhoids Msk:  Symmetrical without gross deformities. R hip with large bruise Extremities:  Without edema.  Neurologic:  Alert and  oriented x4;  grossly normal neurologically. Skin:  Intact without significant lesions or rashes. Psych:  Alert and cooperative. Normal affect.  LAB RESULTS: Recent Labs    12/04/23 0545 12/05/23 0649 12/05/23 0924 12/06/23 0559  WBC 9.3 10.6*  --  9.0  HGB 9.6* 11.1* 10.3* 10.2*  HCT 28.4* 32.9* 30.2* 30.2*  PLT 200 241  --  224   BMET Recent Labs    12/04/23 0545 12/05/23 0649 12/06/23 0559  NA 135 135 134*  K 3.6 4.4 3.8  CL 99 100 97*  CO2 30 31 30   GLUCOSE 123* 122* 146*  BUN 27* 26* 25*  CREATININE 1.10 0.93 0.85  CALCIUM 8.2* 8.6* 8.8*   LFT Recent Labs    12/06/23 0559  PROT 5.1*  ALBUMIN 2.2*  AST 22  ALT 14  ALKPHOS 75  BILITOT 0.9   PT/INR No results for input(s): "LABPROT", "INR" in the last 72 hours.  STUDIES: DG CHEST PORT 1 VIEW Result Date: 12/05/2023 CLINICAL DATA:  141880 SOB (shortness of  breath) [id number] EXAM: PORTABLE CHEST 1 VIEW COMPARISON:  Chest radiograph from one day prior. FINDINGS: Loop recorder overlies the medial left chest. Stable cardiomediastinal silhouette with normal heart size. No pneumothorax. No pleural effusion. Mild hazy patchy opacities throughout the right lung, most prominent in the lower right lung, similar. IMPRESSION: Mild hazy patchy opacities throughout the right lung, most prominent in the lower right lung, similar, compatible with multifocal pneumonia. Electronically Signed   By: Delbert Phenix M.D.   On: 12/05/2023 09:56      Impression / Plan:   82 year old male here with the following:  Rectal bleeding -likely secondary to hemorrhoids Chronic constipation Normocytic anemia Anticoagulated Pneumonia  As above, patient with multiple medical problems on chronic anticoagulation admitted with pneumonia.  He had scant rectal bleeding coating his stool yesterday per nursing.  No significant GI bleeding I can tell.  He has chronic constipation having 1 bowel movement upwards of once every week and a half at worst.  His colonoscopy is up-to-date, MCV is normal, iron studies consistent with that of chronic disease.  DRE at bedside shows brown stool in the vault and small hemorrhoids.  Had a long discussion with the patient and his wife Nathan Fernandez, who is a patient of mine.  He very likely is having hemorrhoidal bleeding in the setting of constipation.  We discussed how aggressive they want to be with workup of this.  He has significant comorbidities and with his recent pneumonia, at higher risk for anesthesia.  The only way to confirm this would be to do colonoscopy or flex sig, I think risks probably outweigh the benefits and with his normal exam 5 years ago I would think unlikely he has any significant polyp/mass lesion etc. to be causing this.  They both agree this is very likely hemorrhoidal and will treat as such and see how he does.  If he has persistent rectal  bleeding, progressive anemia in the setting of anticoagulation we will need to consider endoscopic evaluation, they agree that this otherwise seems superficial and as long as he is stable would rather hold off on endoscopic evaluation currently.  I think that is reasonable and a good plan.  Would treat medically with Anusol as you are doing while in the hospital.  Give scheduled MiraLAX once or twice daily and titrate up as needed to soft bowel movements.  I explained to them how this needs to be titrated  to effect.  Once he is out of the hospital can use Calmol 4 suppositories as well.  We can coordinate outpatient follow-up with him once he is out of the hospital.  PLAN: - good bowel regimen - start Miralax daily to BID and titrate up as needed - continue Anusol suppository for one week course - can add Calmol4 suppositories to use PRN as outpatient (these are OTC) - I think okay to continue anticoagulation as long as no significant bleeding - should he have any significant bleeding or clear anemia related to this can consider endoscopic evaluation, we discussed this at length and holding off on that for now, likely a low yield test and risks > benefits - we can coordinate outpatient follow up once out of the hospital  Call with questions or changes in his status, we will sign off for now otherwise if he is otherwise stable, bleeding seems very superficial.  Harlin Rain, MD [facility]

## 2023-12-06 NOTE — Plan of Care (Signed)
  Problem: Education: Goal: Knowledge of General Education information will improve Description: Including pain rating scale, medication(s)/side effects and non-pharmacologic comfort measures Outcome: Progressing   Problem: Health Behavior/Discharge Planning: Goal: Ability to manage health-related needs will improve Outcome: Progressing   Problem: Clinical Measurements: Goal: Ability to maintain clinical measurements within normal limits will improve Outcome: Progressing   Problem: Clinical Measurements: Goal: Will remain free from infection Outcome: Progressing   Problem: Clinical Measurements: Goal: Diagnostic test results will improve Outcome: Progressing   Problem: Clinical Measurements: Goal: Respiratory complications will improve Outcome: Progressing   Problem: Clinical Measurements: Goal: Cardiovascular complication will be avoided Outcome: Progressing   Problem: Activity: Goal: Risk for activity intolerance will decrease Outcome: Progressing   Problem: Pain Management: Goal: General experience of comfort will improve Outcome: Progressing   Problem: Elimination: Goal: Will not experience complications related to urinary retention Outcome: Progressing   Problem: Skin Integrity: Goal: Risk for impaired skin integrity will decrease Outcome: Progressing   Problem: Nutritional: Goal: Maintenance of adequate nutrition will improve Outcome: Progressing   Problem: Metabolic: Goal: Ability to maintain appropriate glucose levels will improve Outcome: Progressing

## 2023-12-06 NOTE — Plan of Care (Signed)
  Problem: Education: Goal: Knowledge of General Education information will improve Description: Including pain rating scale, medication(s)/side effects and non-pharmacologic comfort measures Outcome: Progressing   Problem: Health Behavior/Discharge Planning: Goal: Ability to manage health-related needs will improve Outcome: Not Progressing   Problem: Clinical Measurements: Goal: Ability to maintain clinical measurements within normal limits will improve Outcome: Not Progressing   Problem: Activity: Goal: Risk for activity intolerance will decrease Outcome: Progressing   Problem: Nutrition: Goal: Adequate nutrition will be maintained Outcome: Progressing   Problem: Coping: Goal: Level of anxiety will decrease Outcome: Progressing   Problem: Pain Management: Goal: General experience of comfort will improve Outcome: Progressing   Problem: Metabolic: Goal: Ability to maintain appropriate glucose levels will improve Outcome: Progressing

## 2023-12-06 NOTE — Progress Notes (Signed)
PROGRESS NOTE    RENAULT NILE  UJW:[id number] DOB: 1941/02/24 DOA: 11/30/2023 PCP: Joaquim Nam, MD   Brief Narrative:  Nathan Fernandez is a 82 y.o. male with medical history significant for CAD, COPD on room air, non-insulin-dependent type 2 diabetes, history of DVT on Xarelto being admitted to the hospital with community-acquired pneumonia.  He lives independently in the community with his wife, who came with him to [facility] this morning, complaining of multiple falls over the past week due to weakness in his left arm and leg.  He has been gradually worsening.  He has some chronic cervical radiculopathy, is on Flexeril was on oxycodone for this previously.  Oxycodone was recently discontinued due to some mild confusion.  Indicates, patient tells me that he has had some worsening of his chronic cough.  Due to the persistent weakness, he came to the ER for evaluation, was found on CT scan to have evidence of possible community-acquired pneumonia.  He was started on empiric IV antibiotics, he was placed on 4 L nasal cannula oxygen due to hypoxia.   **CXR yesterday showed ongoing stable ventilation as well as emphysema with persistent basilar predominant interstitial opacity favored to be interstitial edema.  Will continue diuresis again and give him an additional dose this evening.  Continues to desaturate on ambulatory home O2 screen but his oxygen requirements are weaning and he feels better daily.  Yesterday a.m. he started having some GI bleeding and bright red blood per his rectum.  FOBT was positive and anticoagulation has been held.  GI evaluated and felt it was likely hemorrhoidal bleed as expected and recommended that the patient could continue his anticoagulation and made some adjustments to his bowel regimen and gave him some Anusol suppositories.  Subsequently this morning the patient started having hematuria and case was discussed with urology who will evaluate in the morning  but recommends obtaining a CT hematuria scan first.  Assessment and Plan:  Sepsis and Acute Hypoxic Respiratory Failure due to CAP and Acute on Chronic Diastolic CHF:  -Meeting criteria with tachycardia, leukocytosis, source is community-acquired pneumonia.   -Continue CTX, Azithromycin. Checked PCT and was <0.10.  -Given his cardiac history, could be a significant component of cardiogenic pulmonary edema.  -Leukocytosis Trend slightly : Recent Labs  Lab 11/30/23 1019 12/01/23 0444 12/02/23 0515 12/03/23 0540 12/04/23 0545 12/05/23 0649 12/06/23 0559  WBC 12.9* 9.8 10.6* 9.3 9.3 10.6* 9.0  -Continue supplemental oxygen via nasal cannula wean O2 as tolerated-continuous pulse oximetry maintain O2 saturation greater than 90% SpO2: 90 % O2 Flow Rate (L/min): 1 L/min -Repeat CXR in the AM -Patient will need an Ambulatory home O2 screen prior to discharge and repeat chest x-ray in a.m.  -Patient desaturated on his ambulatory home O2 screen today yesterday and requires at least 2 L oxygen but his oxygen requirement is weaning.  -Will continue with antibiotics and diuresis at this time and continue to wean and attempt to get him off of Oxygen   Acute on Chronic HFrEF CAD HLD -LVEF 40-45%, Global Hypokinesis with known CAD that is not amenable to PCI, per coronary angiography Oct 2024.  -Mildly elevated troponin this admit without chest pain can be expected in this setting.  -BNP elevated to 475.3, CHF suggested on chest CT with pleural effusion. -Initiated diuresis with IV Lasix and has been getting it BID the  -Strict I's and O's and daily weights;  Intake/Output Summary (Last 24 hours) at 12/06/2023 1623 Last data  filed at 12/06/2023 1045 Gross per 24 hour  Intake --  Output 1150 ml  Net -1150 ml  -Continue Rosuvastatin 20 mg po Daily -CXR done yesterday AM and showed "Mild hazy patchy opacities throughout the right lung, most prominent in the lower right lung, similar, compatible  with multifocal pneumonia. -Repeat CXR in the AM   Type 2 Diabetes Mellitus  -Well-controlled, not insulin-dependent -C/w Carb Controlled Diet -C/w Moderate Novolog Sliding Scale Insulin AC/HS -CBG Trend:  Recent Labs  Lab 12/04/23 2047 12/05/23 0752 12/05/23 1209 12/05/23 1632 12/05/23 2127 12/06/23 0841 12/06/23 1158  GLUCAP 161* 121* 153* 116* 179* 139* 167*    Hypertension -Continue home Spironolactone 12.5 mg po Daily, Isosorbide Mononitrate 15 mg po Daily and Carvedilol 6.25 mg po BID -Received IV Furosemide 40 mg x1 this a.m. and will get another dose this p.m. -Continue to Monitor BP per Protocol  -Last BP reading was 115/69   History of DVT -Currently holding anticoagulation with Rivaroxaban 20 mg po Daily given his FOBT positive GI bleeding   Neuropathy -C/w Gabapentin 300 mg po TID and Cyclobenzaprine 5 mg po TIDprn Muscle Spasms -Recent MRI Cervical Spine done and showed "Multilevel cervical spondylosis, worst at C6-7 where there is moderate spinal canal stenosis and severe left and moderate right neural foraminal narrowing. Multilevel moderate to severe neural foraminal narrowing, as detailed above. Extensive T2 hyperintense marrow signal in the left C6 articular pillar with indistinct cortical margins on T1 weighted images. This could represent advanced degenerative change; however, recommend correlation with cervical spine CT to exclude the possibility of a destructive, marrow replacing bone lesion." -Discussed with Neurosurgery NP Leo Grosser who feels that the patient would just benefit from outpatient follow-up and workup but will review his scans just in case -Patient has outpatient follow-up with Dr. Wynetta Emery and January 6 and will continue to maintain this and follow-up with them   Generalized Weakness -Presumably due to his community-acquired pneumonia, continue PT/OT  Normocytic Anemia with now lower GI bleeding in the setting of Hemorrhoids and  Hematuria -Hgb/Hct Trend: Recent Labs  Lab 12/01/23 0444 12/02/23 0515 12/03/23 0540 12/04/23 0545 12/05/23 0649 12/05/23 0924 12/06/23 0559  HGB 10.2* 11.0* 10.5* 9.6* 11.1* 10.3* 10.2*  HCT 31.1* 33.5* 32.3* 28.4* 32.9* 30.2* 30.2*  MCV 94.5 94.6 93.4 92.2 93.5  --  91.5  -Checked Anemia Panel and showed an iron level of 36, UIBC of 135, TIBC 171, saturation ratios of 21%, ferritin level 761, folate levels 12.5 and vitamin B12 873 -Initially held his anticoagulation given that he has rectal bleeding and FOBT is positive; likely hemorrhoidal given that he has history of constipation but after discussion with GI okay to resume currently.  Now having hematuria -Continue to Monitor for S/Sx of Bleeding; will discuss and consult GI for further evaluation -Repeat CBC in the AM   LGIB/Hematochezia -In the setting of Hemorrhoids -C/w Hydrocortisone Suppository 25 mg RC BID -GI consulted and recommending good bowel regimen starting MiraLAX daily to twice daily and titrating up as needed and continue Anusol suppositories for week -GI also recommending adding Calmol4 suppositories to use as needed as an outpatient and thinks that is okay to continue with anticoagulation and continue monitor  Hematuria -New as of 12/06/2023 -Send a UA and UA showed straw-colored urine with moderate hemoglobin, negative ketones, negative leukocytes, negative nitrites, 100 protein, no bacteria seen, 21-50 RBCs per high-power field -Check bladder scan -Discussed with Urology Dr. Annabell Howells who recommended CT Hematuria Workup and ordered  now  -Per My discussion with Dr. Annabell Howells he states to call Urology in the AM on 12/07/23 to the NP working with their group after CT Hematuria Scan is done  Hypoalbuminemia -Patient's Albumin Trend: Recent Labs  Lab 11/13/23 1155 12/02/23 0513 12/03/23 0540 12/04/23 0545 12/05/23 0649 12/06/23 0559  ALBUMIN 3.1* 2.2* 2.2* 2.1* 2.3* 2.2*  -Continue to Monitor and Trend and  repeat CMP in the AM   DVT prophylaxis:  rivaroxaban (XARELTO) tablet 20 mg    Code Status: Full Code Family Communication: No family currently at bedside  Disposition Plan:  Level of care: Med-Surg Status is: Inpatient Remains inpatient appropriate because: Needs further clinical improvement and clearance   Consultants:  Discussed with neurosurgery about the patient scans Gastroenterology Urology  Procedures:  As delineated as above  Antimicrobials:  Anti-infectives (From admission, onward)    Start     Dose/Rate Route Frequency Ordered Stop   12/01/23 1000  azithromycin (ZITHROMAX) 500 mg in sodium chloride 0.9 % 250 mL IVPB        500 mg 250 mL/hr over 60 Minutes Intravenous Every 24 hours 11/30/23 1725 12/04/23 1126   12/01/23 1000  cefTRIAXone (ROCEPHIN) 1 g in sodium chloride 0.9 % 100 mL IVPB        1 g 200 mL/hr over 30 Minutes Intravenous Every 24 hours 11/30/23 1725 12/04/23 1008   11/30/23 1315  cefTRIAXone (ROCEPHIN) 1 g in sodium chloride 0.9 % 100 mL IVPB        1 g 200 mL/hr over 30 Minutes Intravenous  Once 11/30/23 1308 11/30/23 1500   11/30/23 1315  doxycycline (VIBRA-TABS) tablet 100 mg        100 mg Oral  Once 11/30/23 1308 11/30/23 1346       Subjective: Seen and examined at bedside and his respiratory status continues to improve but he now is having some hematuria and blood in his urine.  Feels okay otherwise.  No other concerns or complaints at this time and GI evaluated given his bright red blood per rectum.  No other concerns or complaints at this time.  Objective: Vitals:   12/06/23 0500 12/06/23 0532 12/06/23 0906 12/06/23 1517  BP:  (!) 147/92 (!) 155/98 115/69  Pulse:  89 95 88  Resp:  16    Temp:  98.2 F (36.8 C)  98.5 F (36.9 C)  TempSrc:    Oral  SpO2:  96% 99% 90%  Weight: 70.6 kg     Height:        Intake/Output Summary (Last 24 hours) at 12/06/2023 1639 Last data filed at 12/06/2023 1045 Gross per 24 hour  Intake --   Output 1150 ml  Net -1150 ml   Filed Weights   12/03/23 0500 12/05/23 0518 12/06/23 0500  Weight: 73.7 kg 72.9 kg 70.6 kg   Examination: Physical Exam:  Constitutional: Thin elderly Caucasian male in no acute distress Respiratory: Diminished to auscultation bilaterally with some coarse breath sounds and some slight crackles but no appreciable wheezing, rales or rhonchi. Normal respiratory effort and patient is not tachypenic. No accessory muscle use.  Wearing supplemental oxygen via nasal cannula Cardiovascular: RRR, no murmurs / rubs / gallops. S1 and S2 auscultated.  No appreciable extremity edema Abdomen: Soft, non-tender, non-distended. Bowel sounds positive.  GU: Deferred. Musculoskeletal: No clubbing / cyanosis of digits/nails. No joint deformity upper and lower extremities. Skin: No rashes, lesions, ulcers. No induration; Warm and dry.  Neurologic: CN 2-12 grossly intact with no  focal deficits. Romberg sign and cerebellar reflexes not assessed.  Psychiatric: Normal judgment and insight. Alert and oriented x 3. Normal mood and appropriate affect.   Data Reviewed: I have personally reviewed following labs and imaging studies  CBC: Recent Labs  Lab 12/02/23 0515 12/03/23 0540 12/04/23 0545 12/05/23 0649 12/05/23 0924 12/06/23 0559  WBC 10.6* 9.3 9.3 10.6*  --  9.0  NEUTROABS  --  7.4 6.6 7.7  --  6.6  HGB 11.0* 10.5* 9.6* 11.1* 10.3* 10.2*  HCT 33.5* 32.3* 28.4* 32.9* 30.2* 30.2*  MCV 94.6 93.4 92.2 93.5  --  91.5  PLT 180 201 200 241  --  224   Basic Metabolic Panel: Recent Labs  Lab 12/02/23 0513 12/02/23 0515 12/03/23 0540 12/04/23 0545 12/05/23 0649 12/06/23 0559  NA  --  133* 136 135 135 134*  K  --  3.6 3.6 3.6 4.4 3.8  CL  --  100 100 99 100 97*  CO2  --  26 25 30 31 30   GLUCOSE  --  132* 142* 123* 122* 146*  BUN  --  25* 27* 27* 26* 25*  CREATININE  --  0.96 0.94 1.10 0.93 0.85  CALCIUM  --  8.2* 8.3* 8.2* 8.6* 8.8*  MG 1.9  --  1.8 1.9 2.0 1.9   PHOS 2.9  --  2.9 2.7 2.6 3.0   GFR: Estimated Creatinine Clearance: 65.8 mL/min (by C-G formula based on SCr of 0.85 mg/dL). Liver Function Tests: Recent Labs  Lab 12/02/23 0513 12/03/23 0540 12/04/23 0545 12/05/23 0649 12/06/23 0559  AST 23 23 21 24 22   ALT 13 14 14 15 14   ALKPHOS 76 74 65 74 75  BILITOT 0.6 0.7 0.6 0.7 0.9  PROT 5.1* 5.1* 4.6* 5.2* 5.1*  ALBUMIN 2.2* 2.2* 2.1* 2.3* 2.2*   No results for input(s): "LIPASE", "AMYLASE" in the last 168 hours. No results for input(s): "AMMONIA" in the last 168 hours. Coagulation Profile: No results for input(s): "INR", "PROTIME" in the last 168 hours. Cardiac Enzymes: No results for input(s): "CKTOTAL", "CKMB", "CKMBINDEX", "TROPONINI" in the last 168 hours. BNP (last 3 results) No results for input(s): "PROBNP" in the last 8760 hours. HbA1C: No results for input(s): "HGBA1C" in the last 72 hours. CBG: Recent Labs  Lab 12/05/23 1209 12/05/23 1632 12/05/23 2127 12/06/23 0841 12/06/23 1158  GLUCAP 153* 116* 179* 139* 167*   Lipid Profile: No results for input(s): "CHOL", "HDL", "LDLCALC", "TRIG", "CHOLHDL", "LDLDIRECT" in the last 72 hours. Thyroid Function Tests: No results for input(s): "TSH", "T4TOTAL", "FREET4", "T3FREE", "THYROIDAB" in the last 72 hours. Anemia Panel: Recent Labs    12/04/23 0545  VITAMINB12 873  FOLATE 12.5  FERRITIN 761*  TIBC 171*  IRON 36*  RETICCTPCT 2.3   Sepsis Labs: Recent Labs  Lab 11/30/23 1804 11/30/23 2059 12/02/23 0515  PROCALCITON  --   --  <0.10  LATICACIDVEN 1.2 1.2  --    Recent Results (from the past 240 hours)  Resp panel by RT-PCR (RSV, Flu A&B, Covid) Anterior Nasal Swab     Status: None   Collection Time: 11/30/23 10:19 AM   Specimen: Anterior Nasal Swab  Result Value Ref Range Status   SARS Coronavirus 2 by RT PCR NEGATIVE NEGATIVE Final    Comment: (NOTE) SARS-CoV-2 target nucleic acids are NOT DETECTED.  The SARS-CoV-2 RNA is generally detectable in  upper respiratory specimens during the acute phase of infection. The lowest concentration of SARS-CoV-2 viral copies this assay can  detect is 138 copies/mL. A negative result does not preclude SARS-Cov-2 infection and should not be used as the sole basis for treatment or other patient management decisions. A negative result may occur with  improper specimen collection/handling, submission of specimen other than nasopharyngeal swab, presence of viral mutation(s) within the areas targeted by this assay, and inadequate number of viral copies(<138 copies/mL). A negative result must be combined with clinical observations, patient history, and epidemiological information. The expected result is Negative.  Fact Sheet for Patients:  BloggerCourse.com  Fact Sheet for Healthcare Providers:  SeriousBroker.it  This test is no t yet approved or cleared by the [place] FDA and  has been authorized for detection and/or diagnosis of SARS-CoV-2 by FDA under an Emergency Use Authorization (EUA). This EUA will remain  in effect (meaning this test can be used) for the duration of the COVID-19 declaration under Section 564(b)(1) of the Act, 21 U.S.C.section 360bbb-3(b)(1), unless the authorization is terminated  or revoked sooner.       Influenza A by PCR NEGATIVE NEGATIVE Final   Influenza B by PCR NEGATIVE NEGATIVE Final    Comment: (NOTE) The Xpert Xpress SARS-CoV-2/FLU/RSV plus assay is intended as an aid in the diagnosis of influenza from Nasopharyngeal swab specimens and should not be used as a sole basis for treatment. Nasal washings and aspirates are unacceptable for Xpert Xpress SARS-CoV-2/FLU/RSV testing.  Fact Sheet for Patients: BloggerCourse.com  Fact Sheet for Healthcare Providers: SeriousBroker.it  This test is not yet approved or cleared by the [place] FDA and has been  authorized for detection and/or diagnosis of SARS-CoV-2 by FDA under an Emergency Use Authorization (EUA). This EUA will remain in effect (meaning this test can be used) for the duration of the COVID-19 declaration under Section 564(b)(1) of the Act, 21 U.S.C. section 360bbb-3(b)(1), unless the authorization is terminated or revoked.     Resp Syncytial Virus by PCR NEGATIVE NEGATIVE Final    Comment: (NOTE) Fact Sheet for Patients: BloggerCourse.com  Fact Sheet for Healthcare Providers: SeriousBroker.it  This test is not yet approved or cleared by the [place] FDA and has been authorized for detection and/or diagnosis of SARS-CoV-2 by FDA under an Emergency Use Authorization (EUA). This EUA will remain in effect (meaning this test can be used) for the duration of the COVID-19 declaration under Section 564(b)(1) of the Act, 21 U.S.C. section 360bbb-3(b)(1), unless the authorization is terminated or revoked.  Performed at [facility], [address], [place], [place] [address]     Radiology Studies: DG CHEST PORT 1 VIEW Result Date: 12/05/2023 CLINICAL DATA:  [id number] SOB (shortness of breath) [id number] EXAM: PORTABLE CHEST 1 VIEW COMPARISON:  Chest radiograph from one day prior. FINDINGS: Loop recorder overlies the medial left chest. Stable cardiomediastinal silhouette with normal heart size. No pneumothorax. No pleural effusion. Mild hazy patchy opacities throughout the right lung, most prominent in the lower right lung, similar. IMPRESSION: Mild hazy patchy opacities throughout the right lung, most prominent in the lower right lung, similar, compatible with multifocal pneumonia. Electronically Signed   By: Delbert Phenix M.D.   On: 12/05/2023 09:56   Scheduled Meds:  buPROPion  150 mg Oral Daily   carvedilol  6.25 mg Oral BID   fenofibrate  54 mg Oral Daily   gabapentin  300 mg Oral TID   hydrALAZINE  10 mg Oral Q12H    hydrocortisone  25 mg Rectal BID   insulin aspart  0-15 Units Subcutaneous TID  WC   insulin aspart  0-5 Units Subcutaneous QHS   isosorbide mononitrate  15 mg Oral Daily   polyethylene glycol  17 g Oral BID   rivaroxaban  20 mg Oral Q supper   rosuvastatin  20 mg Oral Daily   spironolactone  12.5 mg Oral Daily   Continuous Infusions:   LOS: 5 days   Marguerita Merles, DO [facility] Available via Epic secure chat 7am-7pm After these hours, please refer to coverage provider listed on [email or web address] 12/06/2023, 4:39 PM

## 2023-12-07 ENCOUNTER — Telehealth: Payer: Self-pay | Admitting: *Deleted

## 2023-12-07 DIAGNOSIS — J189 Pneumonia, unspecified organism: Secondary | ICD-10-CM | POA: Diagnosis not present

## 2023-12-07 LAB — COMPREHENSIVE METABOLIC PANEL
ALT: 13 U/L (ref 0–44)
AST: 23 U/L (ref 15–41)
Albumin: 2.3 g/dL — ABNORMAL LOW (ref 3.5–5.0)
Alkaline Phosphatase: 73 U/L (ref 38–126)
Anion gap: 6 (ref 5–15)
BUN: 25 mg/dL — ABNORMAL HIGH (ref 8–23)
CO2: 30 mmol/L (ref 22–32)
Calcium: 8.9 mg/dL (ref 8.9–10.3)
Chloride: 100 mmol/L (ref 98–111)
Creatinine, Ser: 0.85 mg/dL (ref 0.61–1.24)
GFR, Estimated: >60 mL/min (ref >60)
Glucose, Bld: 123 mg/dL — ABNORMAL HIGH (ref 70–99)
Potassium: 4.1 mmol/L (ref 3.5–5.1)
Sodium: 136 mmol/L (ref 135–145)
Total Bilirubin: 0.8 mg/dL (ref <1.2)
Total Protein: 5.1 g/dL — ABNORMAL LOW (ref 6.5–8.1)

## 2023-12-07 LAB — CBC WITH DIFFERENTIAL/PLATELET
Abs Immature Granulocytes: 0.04 10*3/uL (ref 0.00–0.07)
Basophils Absolute: 0.1 10*3/uL (ref 0.0–0.1)
Basophils Relative: 1 %
Eosinophils Absolute: 0.4 10*3/uL (ref 0.0–0.5)
Eosinophils Relative: 5 %
HCT: 31.6 % — ABNORMAL LOW (ref 39.0–52.0)
Hemoglobin: 10.3 g/dL — ABNORMAL LOW (ref 13.0–17.0)
Immature Granulocytes: 1 %
Lymphocytes Relative: 14 %
Lymphs Abs: 1.1 10*3/uL (ref 0.7–4.0)
MCH: 30.7 pg (ref 26.0–34.0)
MCHC: 32.6 g/dL (ref 30.0–36.0)
MCV: 94 fL (ref 80.0–100.0)
Monocytes Absolute: 1 10*3/uL (ref 0.1–1.0)
Monocytes Relative: 12 %
Neutro Abs: 5.8 10*3/uL (ref 1.7–7.7)
Neutrophils Relative %: 67 %
Platelets: 218 10*3/uL (ref 150–400)
RBC: 3.36 MIL/uL — ABNORMAL LOW (ref 4.22–5.81)
RDW: 14.2 % (ref 11.5–15.5)
WBC: 8.5 10*3/uL (ref 4.0–10.5)
nRBC: 0 % (ref 0.0–0.2)

## 2023-12-07 LAB — MAGNESIUM: Magnesium: 2 mg/dL (ref 1.7–2.4)

## 2023-12-07 LAB — PHOSPHORUS: Phosphorus: 2.7 mg/dL (ref 2.5–4.6)

## 2023-12-07 LAB — GLUCOSE, CAPILLARY
Glucose-Capillary: 125 mg/dL — ABNORMAL HIGH (ref 70–99)
Glucose-Capillary: 215 mg/dL — ABNORMAL HIGH (ref 70–99)
Glucose-Capillary: 132 mg/dL — ABNORMAL HIGH (ref 70–99)
Glucose-Capillary: 141 mg/dL — ABNORMAL HIGH (ref 70–99)

## 2023-12-07 MED ORDER — RIVAROXABAN 20 MG PO TABS: 20.0000 mg | ORAL_TABLET | Freq: Every day | ORAL | Status: DC

## 2023-12-07 MED ORDER — BOOST / RESOURCE BREEZE PO LIQD CUSTOM
1.0000 | Freq: Three times a day (TID) | ORAL | Status: DC
  Administered 2023-12-07 – 2023-12-08 (×2): 1 via ORAL

## 2023-12-07 NOTE — Telephone Encounter (Signed)
-----   Message from Benancio Deeds sent at 12/06/2023  9:03 AM EST ----- Regarding: outpatient follow up Dottie can you help book this patient routine office visit with me for rectal bleeding, hemorrhoids. Nonurgent, thanks

## 2023-12-07 NOTE — Consult Note (Signed)
Chief Complaint: Patient was seen in consultation today for CT-guided peritoneal mass biopsy Chief Complaint  Patient presents with   Fall    Referring Physician(s): Amin,A  Supervising Physician: Irish Lack  Patient Status: [facility] - In-pt  History of Present Illness: Nathan Fernandez is an 82 y.o. 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  CT abdomen pelvis on 12/15 revealed:  1. There is a low-attenuation lesion within the distal tail of pancreas which measures 3.8 x 2.5 cm. This abuts the posterior wall of the gastric fundus. This also appears to encase the splenic artery and vein. Small filling defect within the splenic vein posterior to this lesion is noted. 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 2. Multifocal, ill-defined bilobar low-density liver lesions are new when compared with the study from 04/04/2020. 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 3. Signs of peritoneal carcinomatosis identified. Small volume of perihepatic and pelvic ascites noted. Loculated fluid with surrounding enhancement in the central pelvis measures 3.2 x 1.8 cm. Peritoneal nodule identified within the anterior aspect of the right hemiabdomen 4. Bilateral retrocrural and retroperitoneal adenopathy. 5. Age-indeterminate superior endplate fracture deformity is identified at the T12 level with mild loss of the superior endplate, new from previous lumbar spine MRI dated 12/03/2022. 6. Small right pleural effusion. 7. Patchy airspace densities identified within  the posterior lung bases. 8. Aortic Atherosclerosis (ICD10-I70.0) and Emphysema  Request now received from [facility] for peritoneal mass biopsy.  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   Hosp R facial weakness   CAD (coronary artery disease)    MI, Hosp 1993, PTCA; LHC 04/18/13: Distal left main 40%, proximal LAD with several aneurysmal segments, proximal LAD 50% prior to and after aneurysmal segments, area does not appear to flow-limiting, proximal diagonal 70%, ostial circumflex 20%, proximal circumflex 20%, OM1 30-40%, proximal RCA 40%, mid RCA 30%, distal RCA 20%, EF 50% => med Rx   Cancer (HCC)    skin cancer - 2020   COPD (chronic obstructive pulmonary disease) (HCC)    DM2 (diabetes mellitus, type 2) (HCC)    DVT (deep venous thrombosis) (HCC) 12/2021   History of ETT 1998   wnl   History of hiatal hernia    History of MRI 08/04/2009   brain- atrophy sm vess dz   HLD (hyperlipidemia)    HTN (hypertension)    Myocardial infarction (HCC)    OA (osteoarthritis)    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HCC)    05/09/12 [place]: 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, large collateral proximal to aneurysm   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   Stroke [facility])     Past Surgical History:  Procedure Laterality Date   BUBBLE STUDY  05/15/2020   Procedure: BUBBLE STUDY;  Surgeon: Chrystie Nose, MD;  Location: [facility] ENDOSCOPY;  Service: Cardiovascular;;   CARDIAC CATHETERIZATION     cataract surgery  08/2005   repair lens which moved   EYE SURGERY     LEFT HEART CATH AND CORONARY ANGIOGRAPHY N/A 01/21/2019   Procedure: LEFT HEART CATH AND CORONARY ANGIOGRAPHY;  Surgeon:  Kathleene Hazel, MD;  Location: [facility] INVASIVE CV LAB;  Service: Cardiovascular;  Laterality: N/A;   LEFT HEART CATH AND CORONARY ANGIOGRAPHY N/A 02/13/2021   Procedure: LEFT HEART CATH AND CORONARY ANGIOGRAPHY;  Surgeon: Iran Ouch, MD;  Location: [facility] INVASIVE CV LAB;  Service: Cardiovascular;  Laterality: N/A;   LOOP RECORDER INSERTION N/A 05/15/2020   Procedure: LOOP RECORDER INSERTION;  Surgeon: Hillis Range, MD;  Location: [facility] INVASIVE CV LAB;  Service: Cardiovascular;  Laterality: N/A;   LUMBAR LAMINECTOMY/DECOMPRESSION MICRODISCECTOMY N/A 06/19/2021   Procedure: Lumbar Two-Three, Lumbar Three-Four, Lumbar Four-Five Laminectomy and Foraminotomy;  Surgeon: Donalee Citrin, MD;  Location[facility] OR;  Service: Neurosurgery;  Laterality: N/A;   LUMBAR LAMINECTOMY/DECOMPRESSION MICRODISCECTOMY Bilateral 07/28/2022   Procedure: Sublaminar decompression - Lumbar two-Lumbar three - bilateral redo;  Surgeon: Donalee Citrin, MD;  Location: [facility] OR;  Service: Neurosurgery;  Laterality: Bilateral;  3C   RIGHT/LEFT HEART CATH AND CORONARY ANGIOGRAPHY N/A 09/29/2023   Procedure: RIGHT/LEFT HEART CATH AND CORONARY ANGIOGRAPHY;  Surgeon: Elder Negus, MD;  Location: [facility] INVASIVE CV LAB;  Service: Cardiovascular;  Laterality: N/A;   stress myoview  06/29/2006   sm distal anteroseptal & apical infarct   TEE WITHOUT CARDIOVERSION N/A 05/15/2020   Procedure: TRANSESOPHAGEAL ECHOCARDIOGRAM (TEE);  Surgeon: Chrystie Nose, MD;  Location: [facility] ENDOSCOPY;  Service: Cardiovascular;  Laterality: N/A;   THYROIDECTOMY, PARTIAL  1967   B9 growth   TONSILLECTOMY      Allergies: Ace inhibitors, Angiotensin receptor blockers, Vasotec [enalapril], and Pravachol [pravastatin]  Medications: Prior to Admission medications   Medication Sig Start Date End Date Taking? Authorizing Provider  albuterol (VENTOLIN HFA) 108 (90 Base) MCG/ACT inhaler Inhale 1-2 puffs into the lungs every 6 (six) hours as needed (COPD). 05/01/22  Yes Joaquim Nam, MD  Ascorbic Acid (VITAMIN C PO) Take 1 tablet by mouth daily.   Yes [provider]  aspirin EC 81 MG tablet Take 81 mg by mouth daily. Swallow whole.   Yes [provider]  B Complex CAPS Take  1 capsule by mouth daily.   Yes [provider]  buPROPion (WELLBUTRIN SR) 150 MG 12 hr tablet Take 1 tablet (150 mg total) by mouth daily. 11/13/23  Yes Joaquim Nam, MD  carvedilol (COREG) 6.25 MG tablet TAKE 1 TABLET BY MOUTH TWICE A DAY 11/08/23  Yes Joaquim Nam, MD  Cholecalciferol (VITAMIN D-3 PO) Take 1 capsule by mouth daily.   Yes [provider]  Cyanocobalamin (B-12 PO) Take 1 tablet by mouth daily.   Yes [provider]  dorzolamide-timolol (COSOPT) 22.3-6.8 MG/ML ophthalmic solution Place 1 drop into both eyes 2 (two) times daily. 02/13/20  Yes [provider]  ELDERBERRY PO Take 1 capsule by mouth in the morning and at bedtime.   Yes [provider]  fenofibrate (TRICOR) 48 MG tablet TAKE 1 TABLET BY MOUTH EVERY DAY 11/10/23  Yes Joaquim Nam, MD  gabapentin (NEURONTIN) 100 MG capsule Take 100 mg by mouth daily.   Yes [provider]  Glucosamine-Chondroitin (GLUCOSAMINE CHONDR COMPLEX PO) Take 1 tablet by mouth daily.   Yes [provider]  hydrALAZINE (APRESOLINE) 10 MG tablet TAKE 1 TABLET BY MOUTH EVERY 12 HOURS. 07/22/23  Yes Wendall Stade, MD  isosorbide mononitrate (IMDUR) 30 MG 24 hr tablet TAKE 1/2 TABLET BY MOUTH DAILY 07/22/23  Yes Wendall Stade, MD  ketoconazole (NIZORAL) 2 % cream APPLY 1 APPLICATION TOPICALLY DAILY AS NEEDED FOR IRRITATION.  03/05/23  Yes Joaquim Nam, MD  lidocaine (XYLOCAINE) 5 % ointment Apply 1 Application topically as needed. 10/31/23  Yes Glyn Ade, MD  nitroGLYCERIN (NITROSTAT) 0.4 MG SL tablet Place 1 tablet (0.4 mg total) under the tongue every 5 (five) minutes as needed for chest pain. Up to 3 doses 07/22/23  Yes Swinyer, Zachary George, NP  polyethylene glycol powder (GLYCOLAX/MIRALAX) 17 GM/SCOOP powder Take 17 g by mouth daily as needed. 07/23/23  Yes Joaquim Nam, MD  rosuvastatin (CRESTOR) 20 MG tablet TAKE 1 TABLET BY MOUTH EVERY DAY 11/08/23  Yes Joaquim Nam, MD  traZODone (DESYREL) 100 MG tablet Take 1-2 tablets (100-200 mg total) by mouth at bedtime. Patient taking differently: Take 100 mg by mouth at bedtime. 11/13/23  Yes Joaquim Nam, MD  VITAMIN D PO Take 1 tablet by mouth daily.   Yes [provider]  XARELTO 20 MG TABS tablet TAKE 1 TABLET BY MOUTH DAILY WITH SUPPER. 05/14/23  Yes Joaquim Nam, MD  cyclobenzaprine (FLEXERIL) 5 MG tablet Take 1 tablet (5 mg total) by mouth 3 (three) times daily as needed for muscle spasms. Patient not taking: Reported on 12/01/2023 11/22/23   Joaquim Nam, MD  oxyCODONE-acetaminophen (PERCOCET/ROXICET) 5-325 MG tablet Take 0.5-1 tablets by mouth every 6 (six) hours as needed for severe pain (pain score 7-10) (sedation caution.). Patient not taking: Reported on 12/01/2023 11/13/23   Joaquim Nam, MD     Family History  Problem Relation Age of Onset   Hypertension Mother    Aneurysm Mother    Stroke Mother    Hypertension Father    Heart disease Father        CAD   Heart disease Son        MI   Colon cancer Neg Hx    Prostate cancer Neg Hx     Social History   Socioeconomic History   Marital status: Married    Spouse name: Steward Drone   Number of children: 4   Years of education: Not on file   Highest education level: High school graduate  Occupational History   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[employer]    Comment: for [employer] with [employer]   Occupation: retired  Tobacco Use   Smoking status: Former    Types: Cigars   Smokeless tobacco: Never   Tobacco comments:    cigar occassionally  Vaping Use   Vaping status: Never Used  Substance and Sexual Activity   Alcohol use: Yes    Comment: occasional   Drug use: No   Sexual activity: Not on file  Other Topics Concern   Not on file  Social History Narrative   Divorced, lives with partner Steward Drone)- married 09/2015   3 living children, had a daughter who died at age 50 in 10-27-23    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, retired as of 2019   Social Drivers of Health   Financial Resource Strain: Low Risk  (11/24/2023)   Overall Financial Resource Strain (CARDIA)    Difficulty of Paying Living Expenses: Not very hard  Food Insecurity: No Food Insecurity (11/30/2023)   Hunger Vital Sign    Worried About Running Out of Food in the Last Year: Never true    Ran Out of Food in the Last Year: Never true  Transportation Needs: No Transportation Needs (11/30/2023)   PRAPARE - Administrator, Civil Service (Medical): No    Lack of Transportation (Non-Medical):  No  Physical Activity: Inactive (11/24/2023)   Exercise Vital Sign    Days of Exercise per Week: 0 days    Minutes of Exercise per Session: 0 min  Stress: Stress Concern Present (11/24/2023)   [employer] of Occupational Health - Occupational Stress Questionnaire    Feeling of Stress : To some extent  Social Connections: Socially Integrated (11/24/2023)   Social Connection and Isolation Panel [NHANES]    Frequency of Communication with Friends and Family: More than three times a week    Frequency of Social Gatherings with Friends and Family: More than three times a week    Attends Religious Services: More than 4 times per year    Active Member of [employer] or Organizations: Yes    Attends Engineer, structural: More than 4 times per year    Marital Status: Married      Review of Systems see above; currently denies fever, headache, chest pain, worsening dyspnea, cough, abdominal/back pain, nausea, vomiting.  Does complain of left shoulder pain.  Wife does report pt has  some issues with memory   Vital Signs: BP (!) 149/92 (BP Location: Left Arm)   Pulse 90   Temp 98.5 F (36.9 C) (Oral)   Resp 17   Ht 5\' 9"  (1.753 m)   Wt 155 lb 10.3 oz (70.6 kg)   SpO2 92%   BMI 22.98 kg/m   Advance Care Plan: No documents on file   Physical Exam: Patient awake, answers simple questions okay.  Chest with  distant breath sounds bilaterally, slightly diminished right base.  Heart with regular rate and rhythm.  Abdomen soft, positive bowel sounds, nontender.  No lower extremity edema.  Imaging: CT HEMATURIA WORKUP Result Date: 12/07/2023 CLINICAL DATA:  Hematuria. * Tracking Code: BO * EXAM: CT ABDOMEN AND PELVIS WITHOUT AND WITH CONTRAST TECHNIQUE: Multidetector CT imaging of the abdomen and pelvis was performed following the standard protocol before and following the bolus administration of intravenous contrast. RADIATION DOSE REDUCTION: This exam was performed according to the departmental dose-optimization program which includes automated exposure control, adjustment of the mA and/or kV according to patient size and/or use of iterative reconstruction technique. CONTRAST:  OMNIPAQUE IOHEXOL 300 MG/ML  SOLN COMPARISON:  CT chest 11/30/2023 and CT AP 04/04/2020 FINDINGS: Lower chest: Small right pleural effusion identified. Emphysema. Patchy airspace densities identified within the posterior lung bases. Hepatobiliary: There are multifocal, ill-defined bilobar low-density liver lesions. These are new when compared with the study from 04/04/2020 this includes: -Segment 2/3 lesion measures 2.5 x 2.1 cm, image 19/4. -Segment 8 lesion measures 1.0 x 1.1 cm, image 14/4. -Segment 4B lesion measures 1.7 x 1.4 cm, image 24/4. Gallbladder appears normal. Common bile duct measures 1 cm, image 45/9. Formally 0.9 cm. No common bile duct stone or obstructing mass identified. Pancreas: Within the distal tail of pancreas there is a low-attenuation lesion which measures 3.8 x 2.5 cm, image 23/4. This abuts the posterior wall of the gastric fundus, image 23/4. This also appears to encase the splenic artery and vein. Small filling defect within the splenic vein posterior to this lesion is noted, image 42/9. 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 There does not  appear to be involvement of the celiac trunk or SMA. Spleen: Solitary low-density lesion within the periphery of the upper pole of spleen measures 1.8 cm, image 10/4. Multiple areas of parenchymal scarring noted which is favored to represent sequelae of remote  splenic infarct. Adrenals/Urinary Tract: Normal adrenal glands. No nephrolithiasis or signs of obstructive uropathy. Left renal cortical scarring. Exophytic fluid density cyst off the lateral cortex of the left kidney measures 1.6 cm and is compatible with a Bosniak class 1 cyst, image 27/4. No follow-up imaging recommended. Urinary bladder is unremarkable. Stomach/Bowel: Stomach is nondistended. No pathologic dilatation of the large or small bowel loops. The appendix is visualized and appears normal. Vascular/Lymphatic: Aortic atherosclerosis.  No aneurysm. Prominent retrocrural lymph nodes are identified bilaterally. Right retrocrural node measures 1.2 cm, image 12/4. Bilateral retroperitoneal adenopathy. Preaortic lymph node measures 1.3 cm, image 33/4. left retroperitoneal lymph node measures 1.4 cm, image 33/4. Retrocaval node measures 1.3 cm, image 31/4. Reproductive: Prostate gland enlargement. Other: Signs of peritoneal carcinomatosis identified. Small volume of perihepatic and pelvic ascites noted. Within the anterior right hemiabdomen there is a peritoneal nodule measuring 2.5 x 2.7 cm, image 37/4. Loculated fluid with surrounding enhancement in the central pelvis measures 3.2 x 1.8 cm, image 62/4. Within the central pelvis Musculoskeletal: Lumbar scoliosis and multilevel degenerative disc disease. Status post posterior decompression of the lumbar spine. Age-indeterminate superior endplate fracture deformity is identified at the T12 level with mild loss of the superior endplate, new from previous lumbar spine MRI dated 12/03/2022. IMPRESSION: 1. There is a low-attenuation lesion within the distal tail of pancreas which measures 3.8 x 2.5 cm. This  abuts the posterior wall of the gastric fundus. This also appears to encase the splenic artery and vein. Small filling defect within the splenic vein posterior to this lesion is noted. 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 2. Multifocal, ill-defined bilobar low-density liver lesions are new when compared with the study from 04/04/2020. 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 3. Signs of peritoneal carcinomatosis identified. Small volume of perihepatic and pelvic ascites noted. Loculated fluid with surrounding enhancement in the central pelvis measures 3.2 x 1.8 cm. Peritoneal nodule identified within the anterior aspect of the right hemiabdomen 4. Bilateral retrocrural and retroperitoneal adenopathy. 5. Age-indeterminate superior endplate fracture deformity is identified at the T12 level with mild loss of the superior endplate, new from previous lumbar spine MRI dated 12/03/2022. 6. Small right pleural effusion. 7. Patchy airspace densities identified within the posterior lung bases. 8. Aortic Atherosclerosis (ICD10-I70.0) and Emphysema (ICD10-J43.9). Electronically Signed   By: Signa Kell M.D.   On: 12/07/2023 05:24   DG CHEST PORT 1 VIEW Result Date: 12/05/2023 CLINICAL DATA:  [id number] SOB (shortness of breath) [id number] EXAM: PORTABLE CHEST 1 VIEW COMPARISON:  Chest radiograph from one day prior. FINDINGS: Loop recorder overlies the medial left chest. Stable cardiomediastinal silhouette with normal heart size. No pneumothorax. No pleural effusion. Mild hazy patchy opacities throughout the right lung, most prominent in the lower right lung, similar. IMPRESSION: Mild hazy patchy opacities throughout the right lung, most prominent in the lower right lung, similar, compatible with multifocal pneumonia. Electronically Signed   By: Delbert Phenix M.D.   On: 12/05/2023 09:56    DG CHEST PORT 1 VIEW Result Date: 12/04/2023 CLINICAL DATA:  82 year old male with shortness of breath. EXAM: PORTABLE CHEST 1 VIEW COMPARISON:  Portable chest yesterday and earlier. FINDINGS: Portable AP semi upright view at 0511 hours. Emphysema with asymmetric pulmonary interstitial opacity on CT 11/30/2023 suspected to be interstitial edema. Stable lung volumes and mediastinal contours from yesterday. Left chest wall loop recorder or ICD. Stable ventilation from yesterday. Unresolved basilar  predominant interstitial opacity. No pneumothorax, pleural effusion or consolidation identified. IMPRESSION: Ongoing stable ventilation. Emphysema with persistent basilar predominant interstitial opacity favored to be interstitial edema. Electronically Signed   By: Odessa Fleming M.D.   On: 12/04/2023 08:36   DG CHEST PORT 1 VIEW Result Date: 12/03/2023 CLINICAL DATA:  [id number] with shortness of breath. EXAM: PORTABLE CHEST 1 VIEW COMPARISON:  Portable chest yesterday at 9:07 a.m. FINDINGS: 5:05 a.m. Stable mild cardiomegaly, mild central vascular prominence. Interstitial and patchy hazy opacities continue to be noted in the lower lung fields consistent with edema, pneumonia or combination. Small pleural effusions appear similar. The upper lung fields remain generally clear. Stable mediastinum with aortic tortuosity and atherosclerosis. No new osseous findings. Compare: Overall aeration seems unchanged. Left chest loop recorder device. IMPRESSION: 1. No significant change in aeration since yesterday. 2. Interstitial and patchy hazy opacities continue to be noted in the lower lung fields consistent with edema, pneumonia or combination. 3. Small pleural effusions. Electronically Signed   By: Almira Bar M.D.   On: 12/03/2023 08:02   DG CHEST PORT 1 VIEW Result Date: 12/02/2023 CLINICAL DATA:  Shortness of breath EXAM: PORTABLE CHEST 1 VIEW COMPARISON:  10/31/2023, 11/30/2023 FINDINGS: Left chest wall loop recorder.  Stable heart size. Aortic atherosclerosis. Increased interstitial markings with progressive streaky right basilar opacity. No pleural effusion or pneumothorax. IMPRESSION: Increased interstitial markings with progressive streaky right basilar opacity, suspicious for developing pneumonia. Electronically Signed   By: Duanne Guess D.O.   On: 12/02/2023 13:10   CT Chest Wo Contrast Result Date: 11/30/2023 CLINICAL DATA:  Recurrent falls 2 days ago. Left arm and leg weakness for the past 3 days. Clinical concern for pneumonia. EXAM: CT CHEST WITHOUT CONTRAST TECHNIQUE: Multidetector CT imaging of the chest was performed following the standard protocol without IV contrast. RADIATION DOSE REDUCTION: This exam was performed according to the departmental dose-optimization program which includes automated exposure control, adjustment of the mA and/or kV according to patient size and/or use of iterative reconstruction technique. COMPARISON:  Chest radiographs dated 10/31/2023. Chest CT dated 11/26/2020. FINDINGS: Cardiovascular: Atheromatous calcifications, including the coronary arteries and aorta. Enlarged heart. Minimal pericardial fluid. Mildly enlarged central pulmonary arteries with a main pulmonary artery diameter of 3.2 cm. Mediastinum/Nodes: Interval multiple enlarged mediastinal nodes with the largest in the right precarinal region with a short axis diameter of 20 mm on image number 45/301. No grossly enlarged hilar nodes, difficult to assess with the lack of intravenous contrast. No enlarged axillary, supraclavicular or upper abdominal nodes. Unremarkable thyroid gland, trachea and esophagus. Lungs/Pleura: Increased prominence of the pulmonary vasculature and interstitial markings with mild right basilar and minimal left basilar dependent atelectasis. The atelectasis on the right is more patchy and somewhat confluent, compatible with possible pneumonia. Minimal right pleural effusion. Moderate centrilobular  bullous changes. Small left lower lobe calcified granuloma. Small amount of varicose bronchiectasis and associated parenchymal densities in the anterior left upper lobe. Upper Abdomen: Small amount of free peritoneal fluid. The liver contours are mildly irregular with hypertrophy of the lateral segment of the left lobe and caudate lobe and a relatively small right lobe. The spleen remains lobulated and somewhat small. Musculoskeletal: Thoracic spine degenerative changes. IMPRESSION: 1. Mild congestive heart failure with a minimal right pleural effusion. 2. Mild right basilar and minimal left basilar dependent atelectasis. The atelectasis on the right is more patchy and somewhat confluent, suggesting possible pneumonia. 3. Interval mediastinal adenopathy, nonspecific. 4. Moderate centrilobular bullous emphysematous changes. 5. Small amount  of varicose bronchiectasis and associated parenchymal densities in the anterior left upper lobe. 6. Mildly enlarged central pulmonary arteries, suggesting pulmonary arterial hypertension. 7. Small amount of free peritoneal fluid. 8. Suggesting cirrhosis of the liver. 9.  Calcific coronary artery and aortic atherosclerosis. Aortic Atherosclerosis (ICD10-I70.0) and Emphysema (ICD10-J43.9). Electronically Signed   By: Beckie Salts M.D.   On: 11/30/2023 12:11   MR CERVICAL SPINE WO CONTRAST Result Date: 11/30/2023 CLINICAL DATA:  Cervical radiculopathy. Left-sided neck and shoulder pain with associated pain and numbness in the left hand. EXAM: MRI CERVICAL SPINE WITHOUT CONTRAST TECHNIQUE: Multiplanar, multisequence MR imaging of the cervical spine was performed. No intravenous contrast was administered. COMPARISON:  Cervical spine radiographs 11/10/2023. FINDINGS: Alignment: Trace retrolisthesis of C5 on C6. Vertebrae: Normal vertebral body heights. Multilevel degenerative endplate marrow signal changes, greatest at C5-6. Extensive T2 hyperintense marrow signal in the left C6  articular pillar with indistinct cortical margins on T1 weighted images. Cord: Normal spinal cord signal and volume. Posterior Fossa, vertebral arteries, paraspinal tissues: Unremarkable. Disc levels: C2-C3: Left-greater-than-right facet arthropathy and uncovertebral joint spurring results in moderate left neural foraminal narrowing. C3-C4: Disc bulge and ligamentum flavum buckling results mild spinal canal stenosis. Facet arthropathy and uncovertebral joint spurring contribute to moderate bilateral neural foraminal narrowing. C4-C5: Disc bulge and ligamentum flavum buckling results in mild spinal canal stenosis. Facet arthropathy and uncovertebral joint spurring contributes to severe bilateral neural foraminal narrowing. C5-C6: Disc bulge and ligamentum flavum buckling results in mild spinal canal stenosis. Facet arthropathy and uncovertebral joint spurring contribute to severe bilateral neural foraminal narrowing. C6-C7: Central disc extrusion with caudal migration and ligamentum flavum buckling results in moderate spinal canal stenosis. Left-greater-than-right facet arthropathy and uncovertebral joint spurring results in severe left and moderate right neural foraminal narrowing. C7-T1:  Normal. IMPRESSION: 1. Multilevel cervical spondylosis, worst at C6-7 where there is moderate spinal canal stenosis and severe left and moderate right neural foraminal narrowing. 2. Multilevel moderate to severe neural foraminal narrowing, as detailed above. 3. Extensive T2 hyperintense marrow signal in the left C6 articular pillar with indistinct cortical margins on T1 weighted images. This could represent advanced degenerative change; however, recommend correlation with cervical spine CT to exclude the possibility of a destructive, marrow replacing bone lesion. Electronically Signed   By: Orvan Falconer M.D.   On: 11/30/2023 11:54   CT Head Wo Contrast Result Date: 11/30/2023 CLINICAL DATA:  Polytrauma, blunt.  Recurrent  falls 2 days ago. EXAM: CT HEAD WITHOUT CONTRAST TECHNIQUE: Contiguous axial images were obtained from the base of the skull through the vertex without intravenous contrast. RADIATION DOSE REDUCTION: This exam was performed according to the departmental dose-optimization program which includes automated exposure control, adjustment of the mA and/or kV according to patient size and/or use of iterative reconstruction technique. COMPARISON:  Head CT 08/04/2009.  MRI brain 08/05/2009. FINDINGS: Brain: No acute hemorrhage. Cortical gray-white differentiation is preserved. Patchy hypoattenuation of the cerebral white matter, most consistent with mild chronic small-vessel disease. Prominence of the ventricles and sulci within normal limits for age. No extra-axial collection. Basilar cisterns are patent. Vascular: No hyperdense vessel or unexpected calcification. Skull: No calvarial fracture or suspicious bone lesion. Skull base is unremarkable. Sinuses/Orbits: No acute finding. Other: None. IMPRESSION: 1. No acute intracranial abnormality. 2. Mild chronic small-vessel disease. Electronically Signed   By: Orvan Falconer M.D.   On: 11/30/2023 11:39   CUP PACEART REMOTE DEVICE CHECK Result Date: 11/09/2023 ILR summary report received. Battery status OK. Normal device function.  No new symptom, tachy, brady, or pause episodes. No new AF episodes. Monthly summary reports and ROV/PRN. MC, CVRS   Labs:  CBC: Recent Labs    12/04/23 0545 12/05/23 0649 12/05/23 0924 12/06/23 0559 12/07/23 0519  WBC 9.3 10.6*  --  9.0 8.5  HGB 9.6* 11.1* 10.3* 10.2* 10.3*  HCT 28.4* 32.9* 30.2* 30.2* 31.6*  PLT 200 241  --  224 218    COAGS: No results for input(s): "INR", "APTT" in the last 8760 hours.  BMP: Recent Labs    12/04/23 0545 12/05/23 0649 12/06/23 0559 12/07/23 0519  NA 135 135 134* 136  K 3.6 4.4 3.8 4.1  CL 99 100 97* 100  CO2 30 31 30 30   GLUCOSE 123* 122* 146* 123*  BUN 27* 26* 25* 25*  CALCIUM  8.2* 8.6* 8.8* 8.9  CREATININE 1.10 0.93 0.85 0.85  GFRNONAA >60 >60 >60 >60    LIVER FUNCTION TESTS: Recent Labs    12/04/23 0545 12/05/23 0649 12/06/23 0559 12/07/23 0519  BILITOT 0.6 0.7 0.9 0.8  AST 21 24 22 23   ALT 14 15 14 13   ALKPHOS 65 74 75 73  PROT 4.6* 5.2* 5.1* 5.1*  ALBUMIN 2.1* 2.3* 2.2* 2.3*    TUMOR MARKERS: No results for input(s): "AFPTM", "CEA", "CA199", "CHROMGRNA" in the last 8760 hours.  Assessment and Plan: 82 y.o. 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  CT abdomen pelvis on 12/15 revealed:  1. There is a low-attenuation lesion within the distal tail of pancreas which measures 3.8 x 2.5 cm. This abuts the posterior wall of the gastric fundus. This also appears to encase the splenic artery and vein. Small filling defect within the splenic vein posterior to this lesion is noted. 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 2. Multifocal, ill-defined bilobar low-density liver lesions are new when compared with the study from 04/04/2020. 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 3. Signs of peritoneal carcinomatosis identified. Small volume of perihepatic and pelvic ascites noted. Loculated fluid with surrounding enhancement in the central pelvis measures 3.2 x 1.8 cm. Peritoneal nodule identified within the anterior aspect of the right hemiabdomen 4. Bilateral retrocrural and retroperitoneal adenopathy. 5. Age-indeterminate superior endplate fracture deformity is identified at the T12 level with mild loss of the superior  endplate, new from previous lumbar spine MRI dated 12/03/2022. 6. Small right pleural effusion. 7. Patchy airspace densities identified within the posterior lung bases. 8. Aortic Atherosclerosis (ICD10-I70.0) and Emphysema  Request now received from [facility] for peritoneal mass biopsy.  Imaging studies were reviewed by Dr. Fredia Sorrow.  Plan is for CT-guided peritoneal mass biopsy on 12/17.Risks and benefits of procedure was discussed with the patient Myrtie Hawk Ulyses Amor  including, but not limited to bleeding, infection, damage to adjacent structures or low yield requiring additional tests.  All of the questions were answered and there is agreement to proceed.  Consent signed and in chart.  Last dose of Xarelto was on 12/13.  Thank you for this interesting consult.  I greatly enjoyed meeting Nathan Fernandez and look forward to participating in their care.  A copy of this report was sent to the requesting provider on this date.  Electronically Signed: D. Jeananne Rama, PA-C 12/07/2023, 1:52 PM   I spent a total of 25 minutes  in face to face in clinical consultation, greater than 50% of which was counseling/coordinating care for CT-guided peritoneal mass biopsy

## 2023-12-07 NOTE — TOC Progression Note (Signed)
Transition of Care [facility]) - Progression Note    Patient Details  Name: Nathan Fernandez MRN: [id number] Date of Birth: May 26, 1941  [facility] of [facility]) CM/SW Contact  Otelia Santee, LCSW Phone Number: 12/07/2023, 12:18 PM  Clinical Narrative:    Pt not medically ready and will need a couple of more days prior to discharging to SNF. Will initiate new insurance auth request for SNF closer to pt being medically ready.   Expected Discharge Plan: Skilled Nursing Facility Barriers to Discharge: Continued Medical Work up, SNF Pending bed offer  Expected Discharge Plan and Services In-house Referral: Clinical Social Work Discharge Planning Services: NA Post Acute Care Choice: Skilled Nursing Facility Living arrangements for the past 2 months: Single Family Home                 DME Arranged: N/A DME Agency: NA                   Social Determinants of Health (SDOH) Interventions SDOH Screenings   Food Insecurity: No Food Insecurity (11/30/2023)  Housing: Patient Declined (11/30/2023)  Transportation Needs: No Transportation Needs (11/30/2023)  Utilities: Not At Risk (11/30/2023)  Alcohol Screen: Low Risk  (11/24/2023)  Depression (PHQ2-9): Low Risk  (11/24/2023)  Financial Resource Strain: Low Risk  (11/24/2023)  Physical Activity: Inactive (11/24/2023)  Social Connections: Socially Integrated (11/24/2023)  Stress: Stress Concern Present (11/24/2023)  Tobacco Use: Medium Risk (11/30/2023)  Health Literacy: Adequate Health Literacy (11/24/2023)    Readmission Risk Interventions     No data to display

## 2023-12-07 NOTE — Plan of Care (Signed)
  Problem: Education: Goal: Knowledge of General Education information will improve Description: Including pain rating scale, medication(s)/side effects and non-pharmacologic comfort measures Outcome: Progressing   Problem: Clinical Measurements: Goal: Respiratory complications will improve Outcome: Progressing Goal: Cardiovascular complication will be avoided Outcome: Progressing   Problem: Activity: Goal: Risk for activity intolerance will decrease Outcome: Progressing   Problem: Nutrition: Goal: Adequate nutrition will be maintained Outcome: Progressing   Problem: Coping: Goal: Level of anxiety will decrease Outcome: Progressing   Problem: Coping: Goal: Level of anxiety will decrease Outcome: Progressing

## 2023-12-07 NOTE — Progress Notes (Signed)
PROGRESS NOTE    Nathan Fernandez  QMV:[id number] DOB: [birth year] DOA: 11/30/2023 PCP: Joaquim Nam, MD    Brief Narrative:  82 year old with history of CAD, COPD on room air, non-insulin-dependent DM 2, DVT on Xarelto admitted for community-acquired pneumonia.  Initially also thought to be slightly volume overload requiring IV Lasix.  During hospitalization started having some rectal bleeding concerning for possible hemorrhoids, seen by GI recommended Anusol suppositories.  Patient also had hematuria, case was discussed with urology who recommended CT hematuria.  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   Assessment & Plan:  Principal Problem:   CAP (community acquired pneumonia) Active Problems:   Anticoagulated   Rectal bleeding   Hemorrhoids   Constipation    Sepsis and Acute Hypoxic Respiratory Failure due to CAP and Acute on Chronic Diastolic CHF:  Initially met sepsis criteria which is improving.  Patient has now completed 5 days of Rocephin and azithromycin.  Continue bronchodilators as needed, I-S/flutter valve.   Acute on Chronic HFrEF, improved CAD HLD Currently chest pain-free.  Previous echocardiogram has shown EF of 40%.  During hospitalization there was concerns of slight volume overload requiring IV diuretics.  Monitor and replete electrolytes.  Continue Coreg, hydralazine, Aldactone, Imdur  Pancreatic and peritoneal mass - Incidental finding seen on CT scan.  Discussed with patient, IR consulted for peritoneal biopsy.  Will check CA 19-9   Type 2 Diabetes Mellitus  Sliding scale and Accu-Chek.  Adjust as necessary  Hypertension Continue cardiac meds.  IV as needed   History of DVT On 0 alto   Neuropathy Continue as needed Flexeril.  3 times daily gabapentin. -Recent outpatient MRI has shown multilevel cervical spondylosis with canal narrowing.  Has outpatient follow-up appointment with neurosurgery on  January 6.  Also previous provider has discussed case with neurosurgery NP   Generalized Weakness -Presumably due to his community-acquired pneumonia, continue PT/OT = SNF   Normocytic Anemia with now lower GI bleeding in the setting of Hemorrhoids and Hematuria Stable hemoglobin   LGIB/Hematochezia, resolved -In the setting of Hemorrhoids -C/w Hydrocortisone Suppository 25 mg RC BID -GI consulted and recommending good bowel regimen starting MiraLAX daily to twice daily and titrating up as needed and continue Anusol suppositories for week   Hematuria, resolved Hematuria noted.  No evidence of UTI.  Previous provider discussed case with urology   Hypoalbuminemia Encourage oral nutrition   DVT prophylaxis: rivaroxaban (XARELTO) tablet 20 mg   Code Status: Full code Family Communication: Vinson Moselle, no answer Status is: Inpatient Remains inpatient appropriate because: Ongoing treatment for community-acquired pneumonia and mild fluid overload.  Incidental finding regarding abdominal mass.   Subjective:  Seen and examined at bedside.  Patient denies having any prior history of cancer.  He would like to get a peritoneal biopsy and thereafter decide if he would like to go through further treatment. Called his wife Steward Drone at patient's request but she did not answer  Examination:  General exam: Appears calm and comfortable, Hectic frail. Respiratory system: Clear to auscultation. Respiratory effort normal. Cardiovascular system: S1 & S2 heard, RRR. No JVD, murmurs, rubs, gallops or clicks. No pedal edema. Gastrointestinal system: Abdomen is nondistended, soft and nontender. No organomegaly or masses felt. Normal bowel sounds heard. Central nervous system: Alert and oriented. No focal neurological deficits. Extremities: Symmetric 5 x 5 power. Skin: No rashes, lesions or ulcers Psychiatry: Judgement and insight appear normal. Mood & affect appropriate.  Pressure  Injury 11/30/23 Sacrum Bilateral Stage 2 -  Partial thickness loss of dermis presenting as a shallow open injury with a red, pink wound bed without slough. (Active)  11/30/23 1700  Location: Sacrum  Location Orientation: Bilateral  Staging: Stage 2 -  Partial thickness loss of dermis presenting as a shallow open injury with a red, pink wound bed without slough.  Wound Description (Comments):   Present on Admission: Yes     Diet Orders (From admission, onward)     Start     Ordered   11/30/23 1730  Diet Carb Modified Fluid consistency: Thin; Room service appropriate? Yes  Diet effective now       Question Answer Comment  Diet-HS Snack? Nothing   Calorie Level Medium 1600-2000   Fluid consistency: Thin   Room service appropriate? Yes      11/30/23 1729            Objective: Vitals:   12/06/23 2001 12/07/23 0427 12/07/23 0500 12/07/23 0801  BP: 139/84 (!) 145/86  (!) 149/92  Pulse: 88 88  90  Resp: 18 18  17   Temp: 97.7 F (36.5 C) 98.4 F (36.9 C)  98.5 F (36.9 C)  TempSrc: Oral Oral  Oral  SpO2: 97% 95%  92%  Weight:   70.6 kg   Height:        Intake/Output Summary (Last 24 hours) at 12/07/2023 1347 Last data filed at 12/07/2023 1200 Gross per 24 hour  Intake 610 ml  Output 650 ml  Net -40 ml   Filed Weights   12/05/23 0518 12/06/23 0500 12/07/23 0500  Weight: 72.9 kg 70.6 kg 70.6 kg    Scheduled Meds:  buPROPion  150 mg Oral Daily   carvedilol  6.25 mg Oral BID   feeding supplement  1 Container Oral TID BM   fenofibrate  54 mg Oral Daily   gabapentin  300 mg Oral TID   hydrALAZINE  10 mg Oral Q12H   hydrocortisone  25 mg Rectal BID   insulin aspart  0-15 Units Subcutaneous TID WC   insulin aspart  0-5 Units Subcutaneous QHS   isosorbide mononitrate  15 mg Oral Daily   polyethylene glycol  17 g Oral BID   rivaroxaban  20 mg Oral Q supper   rosuvastatin  20 mg Oral Daily   spironolactone  12.5 mg Oral Daily   Continuous Infusions:  Nutritional  status     Body mass index is 22.98 kg/m.  Data Reviewed:   CBC: Recent Labs  Lab 12/03/23 0540 12/04/23 0545 12/05/23 0649 12/05/23 0924 12/06/23 0559 12/07/23 0519  WBC 9.3 9.3 10.6*  --  9.0 8.5  NEUTROABS 7.4 6.6 7.7  --  6.6 5.8  HGB 10.5* 9.6* 11.1* 10.3* 10.2* 10.3*  HCT 32.3* 28.4* 32.9* 30.2* 30.2* 31.6*  MCV 93.4 92.2 93.5  --  91.5 94.0  PLT 201 200 241  --  224 218   Basic Metabolic Panel: Recent Labs  Lab 12/03/23 0540 12/04/23 0545 12/05/23 0649 12/06/23 0559 12/07/23 0519  NA 136 135 135 134* 136  K 3.6 3.6 4.4 3.8 4.1  CL 100 99 100 97* 100  CO2 25 30 31 30 30   GLUCOSE 142* 123* 122* 146* 123*  BUN 27* 27* 26* 25* 25*  CREATININE 0.94 1.10 0.93 0.85 0.85  CALCIUM 8.3* 8.2* 8.6* 8.8* 8.9  MG 1.8 1.9 2.0 1.9 2.0  PHOS 2.9 2.7 2.6 3.0 2.7   GFR: Estimated Creatinine  Clearance: 65.8 mL/min (by C-G formula based on SCr of 0.85 mg/dL). Liver Function Tests: Recent Labs  Lab 12/03/23 0540 12/04/23 0545 12/05/23 0649 12/06/23 0559 12/07/23 0519  AST 23 21 24 22 23   ALT 14 14 15 14 13   ALKPHOS 74 65 74 75 73  BILITOT 0.7 0.6 0.7 0.9 0.8  PROT 5.1* 4.6* 5.2* 5.1* 5.1*  ALBUMIN 2.2* 2.1* 2.3* 2.2* 2.3*   No results for input(s): "LIPASE", "AMYLASE" in the last 168 hours. No results for input(s): "AMMONIA" in the last 168 hours. Coagulation Profile: No results for input(s): "INR", "PROTIME" in the last 168 hours. Cardiac Enzymes: No results for input(s): "CKTOTAL", "CKMB", "CKMBINDEX", "TROPONINI" in the last 168 hours. BNP (last 3 results) No results for input(s): "PROBNP" in the last 8760 hours. HbA1C: No results for input(s): "HGBA1C" in the last 72 hours. CBG: Recent Labs  Lab 12/06/23 1158 12/06/23 1652 12/06/23 2054 12/07/23 0804 12/07/23 1252  GLUCAP 167* 150* 164* 125* 215*   Lipid Profile: No results for input(s): "CHOL", "HDL", "LDLCALC", "TRIG", "CHOLHDL", "LDLDIRECT" in the last 72 hours. Thyroid Function Tests: No  results for input(s): "TSH", "T4TOTAL", "FREET4", "T3FREE", "THYROIDAB" in the last 72 hours. Anemia Panel: No results for input(s): "VITAMINB12", "FOLATE", "FERRITIN", "TIBC", "IRON", "RETICCTPCT" in the last 72 hours. Sepsis Labs: Recent Labs  Lab 11/30/23 1804 11/30/23 2059 12/02/23 0515  PROCALCITON  --   --  <0.10  LATICACIDVEN 1.2 1.2  --     Recent Results (from the past 240 hours)  Resp panel by RT-PCR (RSV, Flu A&B, Covid) Anterior Nasal Swab     Status: None   Collection Time: 11/30/23 10:19 AM   Specimen: Anterior Nasal Swab  Result Value Ref Range Status   SARS Coronavirus 2 by RT PCR NEGATIVE NEGATIVE Final    Comment: (NOTE) SARS-CoV-2 target nucleic acids are NOT DETECTED.  The SARS-CoV-2 RNA is generally detectable in upper respiratory specimens during the acute phase of infection. The lowest concentration of SARS-CoV-2 viral copies this assay can detect is 138 copies/mL. A negative result does not preclude SARS-Cov-2 infection and should not be used as the sole basis for treatment or other patient management decisions. A negative result may occur with  improper specimen collection/handling, submission of specimen other than nasopharyngeal swab, presence of viral mutation(s) within the areas targeted by this assay, and inadequate number of viral copies(<138 copies/mL). A negative result must be combined with clinical observations, patient history, and epidemiological information. The expected result is Negative.  Fact Sheet for Patients:  BloggerCourse.com  Fact Sheet for Healthcare Providers:  SeriousBroker.it  This test is no t yet approved or cleared by the [place] FDA and  has been authorized for detection and/or diagnosis of SARS-CoV-2 by FDA under an Emergency Use Authorization (EUA). This EUA will remain  in effect (meaning this test can be used) for the duration of the COVID-19 declaration  under Section 564(b)(1) of the Act, 21 U.S.C.section 360bbb-3(b)(1), unless the authorization is terminated  or revoked sooner.       Influenza A by PCR NEGATIVE NEGATIVE Final   Influenza B by PCR NEGATIVE NEGATIVE Final    Comment: (NOTE) The Xpert Xpress SARS-CoV-2/FLU/RSV plus assay is intended as an aid in the diagnosis of influenza from Nasopharyngeal swab specimens and should not be used as a sole basis for treatment. Nasal washings and aspirates are unacceptable for Xpert Xpress SARS-CoV-2/FLU/RSV testing.  Fact Sheet for Patients: BloggerCourse.com  Fact Sheet for Healthcare Providers: SeriousBroker.it  This test is not yet approved or cleared by the [place] and has been authorized for detection and/or diagnosis of SARS-CoV-2 by FDA under an Emergency Use Authorization (EUA). This EUA will remain in effect (meaning this test can be used) for the duration of the COVID-19 declaration under Section 564(b)(1) of the Act, 21 U.S.C. section 360bbb-3(b)(1), unless the authorization is terminated or revoked.     Resp Syncytial Virus by PCR NEGATIVE NEGATIVE Final    Comment: (NOTE) Fact Sheet for Patients: BloggerCourse.com  Fact Sheet for Healthcare Providers: SeriousBroker.it  This test is not yet approved or cleared by the [place] FDA and has been authorized for detection and/or diagnosis of SARS-CoV-2 by FDA under an Emergency Use Authorization (EUA). This EUA will remain in effect (meaning this test can be used) for the duration of the COVID-19 declaration under Section 564(b)(1) of the Act, 21 U.S.C. section 360bbb-3(b)(1), unless the authorization is terminated or revoked.  Performed at [facility], [address], [place], [place] [address]          Radiology Studies: CT HEMATURIA WORKUP Result Date: 12/07/2023 CLINICAL DATA:   Hematuria. * Tracking Code: BO * EXAM: CT ABDOMEN AND PELVIS WITHOUT AND WITH CONTRAST TECHNIQUE: Multidetector CT imaging of the abdomen and pelvis was performed following the standard protocol before and following the bolus administration of intravenous contrast. RADIATION DOSE REDUCTION: This exam was performed according to the departmental dose-optimization program which includes automated exposure control, adjustment of the mA and/or kV according to patient size and/or use of iterative reconstruction technique. CONTRAST:  OMNIPAQUE IOHEXOL 300 MG/ML  SOLN COMPARISON:  CT chest 11/30/2023 and CT AP 04/04/2020 FINDINGS: Lower chest: Small right pleural effusion identified. Emphysema. Patchy airspace densities identified within the posterior lung bases. Hepatobiliary: There are multifocal, ill-defined bilobar low-density liver lesions. These are new when compared with the study from 04/04/2020 this includes: -Segment 2/3 lesion measures 2.5 x 2.1 cm, image 19/4. -Segment 8 lesion measures 1.0 x 1.1 cm, image 14/4. -Segment 4B lesion measures 1.7 x 1.4 cm, image 24/4. Gallbladder appears normal. Common bile duct measures 1 cm, image 45/9. Formally 0.9 cm. No common bile duct stone or obstructing mass identified. Pancreas: Within the distal tail of pancreas there is a low-attenuation lesion which measures 3.8 x 2.5 cm, image 23/4. This abuts the posterior wall of the gastric fundus, image 23/4. This also appears to encase the splenic artery and vein. Small filling defect within the splenic vein posterior to this lesion is noted, image 42/9. 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 There does not appear to be involvement of the celiac trunk or SMA. Spleen: Solitary low-density lesion within the periphery of the upper pole of spleen measures 1.8 cm, image 10/4. Multiple areas of parenchymal scarring noted which is favored to represent sequelae of  remote splenic infarct. Adrenals/Urinary Tract: Normal adrenal glands. No nephrolithiasis or signs of obstructive uropathy. Left renal cortical scarring. Exophytic fluid density cyst off the lateral cortex of the left kidney measures 1.6 cm and is compatible with a Bosniak class 1 cyst, image 27/4. No follow-up imaging recommended. Urinary bladder is unremarkable. Stomach/Bowel: Stomach is nondistended. No pathologic dilatation of the large or small bowel loops. The appendix is visualized and appears normal. Vascular/Lymphatic: Aortic atherosclerosis.  No aneurysm. Prominent retrocrural lymph nodes are identified bilaterally. Right retrocrural node measures 1.2 cm, image 12/4. Bilateral retroperitoneal adenopathy. Preaortic lymph node measures  1.3 cm, image 33/4. left retroperitoneal lymph node measures 1.4 cm, image 33/4. Retrocaval node measures 1.3 cm, image 31/4. Reproductive: Prostate gland enlargement. Other: Signs of peritoneal carcinomatosis identified. Small volume of perihepatic and pelvic ascites noted. Within the anterior right hemiabdomen there is a peritoneal nodule measuring 2.5 x 2.7 cm, image 37/4. Loculated fluid with surrounding enhancement in the central pelvis measures 3.2 x 1.8 cm, image 62/4. Within the central pelvis Musculoskeletal: Lumbar scoliosis and multilevel degenerative disc disease. Status post posterior decompression of the lumbar spine. Age-indeterminate superior endplate fracture deformity is identified at the T12 level with mild loss of the superior endplate, new from previous lumbar spine MRI dated 12/03/2022. IMPRESSION: 1. There is a low-attenuation lesion within the distal tail of pancreas which measures 3.8 x 2.5 cm. This abuts the posterior wall of the gastric fundus. This also appears to encase the splenic artery and vein. Small filling defect within the splenic vein posterior to this lesion is noted. 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 2. Multifocal, ill-defined bilobar low-density liver lesions are new when compared with the study from 04/04/2020. 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 3. Signs of peritoneal carcinomatosis identified. Small volume of perihepatic and pelvic ascites noted. Loculated fluid with surrounding enhancement in the central pelvis measures 3.2 x 1.8 cm. Peritoneal nodule identified within the anterior aspect of the right hemiabdomen 4. Bilateral retrocrural and retroperitoneal adenopathy. 5. Age-indeterminate superior endplate fracture deformity is identified at the T12 level with mild loss of the superior endplate, new from previous lumbar spine MRI dated 12/03/2022. 6. Small right pleural effusion. 7. Patchy airspace densities identified within the posterior lung bases. 8. Aortic Atherosclerosis (ICD10-I70.0) and Emphysema (ICD10-J43.9). Electronically Signed   By: Signa Kell M.D.   On: 12/07/2023 05:24           LOS: 6 days   Time spent= 35 mins    Miguel Rota, MD [facility]  If 7PM-7AM, please contact night-coverage  12/07/2023, 1:47 PM

## 2023-12-07 NOTE — Progress Notes (Signed)
PT Cancellation Note  Patient Details Name: Nathan Fernandez MRN: [id number] DOB: 1941-12-14   Cancelled Treatment:     Pt declined this am due to shoulder pain.  "Not a good time".   Unable to attempt again later in day but continue to see as LPT has rec SNF.  Felecia Shelling  PTA Acute  Rehabilitation Services Office M-F          [phone]

## 2023-12-07 NOTE — Telephone Encounter (Signed)
Patient has been scheduled with Dr Adela Lank for follow up in the office on Friday, 02/26/23 at 140 pm. Patient is currently inpatient at the hospital. Will advise him of office visit upon discharge.

## 2023-12-07 NOTE — Consult Note (Signed)
Urology Consult Note   Requesting Attending Physician:  Miguel Rota, MD Service Providing Consult: Urology  Consulting Attending: Dr. Annabell Howells   Reason for Consult:  Hematuria  HPI: Nathan Fernandez is seen in consultation for reasons noted above at the request of Amin, Ankit C, MD. patient was admitted for community-acquired pneumonia with PMH significant for CAD, COPD, T2DM, and DVT on Xarelto.  While in hospital he experienced gross hematuria for the first time, for which urology was consulted.  On my arrival the morning of 12/16 his hematuria have resolved and the clear yellow urine was in his urinal.  On arrival patient was alert, oriented, and in no distress.  He reports no previous urologic surgery or lower urinary tract symptoms.  Hematuria was very light on the day of his arrival, and somewhat worse when he used the restroom later that day by his report.  It has since resolved.  ------------------  Assessment:  82 y.o. male with gross hematuria.    Recommendations:  #gross hematuria- resolved Clear yellow urine today.  Hematuria has resolved.  Presumably related to his Xarelto.  Outpatient cystoscopy pending the outcome of this hospitalization.  Please follow-up with [facility] on discharge.  [facility] [address] floor [place], [place] [address] [phone]   CT hematuria collected which reveals no concerning lesions or clot accumulation in the urinary system.  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  Diffuse lymphadenopathy and signs of peritoneal carcinomatosis were also some noted.  These are findings are prognostically poor.  I have contacted his primary team who will complete any further workup and engage oncology.  Urology will sign off at this time.  Please feel free to contact us should he have any further   Case and plan  discussed with Dr. 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   Hosp R facial weakness   CAD (coronary artery disease)    MI, Hosp 1993, PTCA; LHC 04/18/13: Distal left main 40%, proximal LAD with several aneurysmal segments, proximal LAD 50% prior to and after aneurysmal segments, area does not appear to flow-limiting, proximal diagonal 70%, ostial circumflex 20%, proximal circumflex 20%, OM1 30-40%, proximal RCA 40%, mid RCA 30%, distal RCA 20%, EF 50% => med Rx   Cancer (HCC)    skin cancer - 2020   COPD (chronic obstructive pulmonary disease) (HCC)    DM2 (diabetes mellitus, type 2) (HCC)    DVT (deep venous thrombosis) (HCC) 12/2021   History of ETT 1998   wnl   History of hiatal hernia    History of MRI 08/04/2009   brain- atrophy sm vess dz   HLD (hyperlipidemia)    HTN (hypertension)    Myocardial infarction (HCC)    OA (osteoarthritis)    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HCC)    05/09/12 [place]: 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, large collateral proximal to aneurysm   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)     Past Surgical History:  Past Surgical History:  Procedure Laterality Date   BUBBLE STUDY  05/15/2020   Procedure: BUBBLE STUDY;  Surgeon: Chrystie Nose, MD;  Location: [facility] ENDOSCOPY;  Service: Cardiovascular;;   CARDIAC CATHETERIZATION     cataract surgery  08/2005   repair lens which moved  EYE SURGERY     LEFT HEART CATH AND CORONARY ANGIOGRAPHY N/A 01/21/2019   Procedure: LEFT HEART CATH AND CORONARY ANGIOGRAPHY;  Surgeon: Kathleene Hazel, MD;  Location: [facility] INVASIVE CV LAB;  Service: Cardiovascular;  Laterality: N/A;   LEFT HEART CATH AND CORONARY ANGIOGRAPHY N/A 02/13/2021   Procedure: LEFT HEART CATH AND CORONARY ANGIOGRAPHY;  Surgeon: Iran Ouch, MD;  Location: [facility] INVASIVE CV LAB;  Service: Cardiovascular;   Laterality: N/A;   LOOP RECORDER INSERTION N/A 05/15/2020   Procedure: LOOP RECORDER INSERTION;  Surgeon: Hillis Range, MD;  Location: [facility] INVASIVE CV LAB;  Service: Cardiovascular;  Laterality: N/A;   LUMBAR LAMINECTOMY/DECOMPRESSION MICRODISCECTOMY N/A 06/19/2021   Procedure: Lumbar Two-Three, Lumbar Three-Four, Lumbar Four-Five Laminectomy and Foraminotomy;  Surgeon: Donalee Citrin, MD;  Location: [facility] [place] [facility] OR;  Service: Neurosurgery;  Laterality: N/A;   LUMBAR LAMINECTOMY/DECOMPRESSION MICRODISCECTOMY Bilateral 07/28/2022   Procedure: Sublaminar decompression - Lumbar two-Lumbar three - bilateral redo;  Surgeon: Donalee Citrin, MD;  Location[facility] OR;  Service: Neurosurgery;  Laterality: Bilateral;  3C   RIGHT/LEFT HEART CATH AND CORONARY ANGIOGRAPHY N/A 09/29/2023   Procedure: RIGHT/LEFT HEART CATH AND CORONARY ANGIOGRAPHY;  Surgeon: Elder Negus, MD;  Location: [facility] INVASIVE CV LAB;  Service: Cardiovascular;  Laterality: N/A;   stress myoview  06/29/2006   sm distal anteroseptal & apical infarct   TEE WITHOUT CARDIOVERSION N/A 05/15/2020   Procedure: TRANSESOPHAGEAL ECHOCARDIOGRAM (TEE);  Surgeon: Chrystie Nose, MD;  Location: [facility] ENDOSCOPY;  Service: Cardiovascular;  Laterality: N/A;   THYROIDECTOMY, PARTIAL  1967   B9 growth   TONSILLECTOMY      Medication: Current Facility-Administered Medications  Medication Dose Route Frequency Provider Last Rate Last Admin   acetaminophen (TYLENOL) tablet 650 mg  650 mg Oral Q6H PRN Kirby Crigler, Mir M, MD   650 mg at 12/06/23 1857   Or   acetaminophen (TYLENOL) suppository 650 mg  650 mg Rectal Q6H PRN Kirby Crigler, Mir M, MD       albuterol (PROVENTIL) (2.5 MG/3ML) 0.083% nebulizer solution 2.5 mg  2.5 mg Nebulization Q2H PRN Kirby Crigler, Mir M, MD       buPROPion [facility] SR) 12 hr tablet 150 mg  150 mg Oral Daily Kirby Crigler, Mir M, MD   150 mg at 12/06/23 0908   carvedilol (COREG) tablet 6.25 mg  6.25 mg Oral BID Kirby Crigler, Mir M, MD   6.25 mg at 12/06/23  2059   cyclobenzaprine (FLEXERIL) tablet 5 mg  5 mg Oral TID PRN Maryln Gottron, MD   5 mg at 12/04/23 0935   fenofibrate tablet 54 mg  54 mg Oral Daily Kirby Crigler, Mir M, MD   54 mg at 12/06/23 0908   gabapentin (NEURONTIN) capsule 300 mg  300 mg Oral TID Maryln Gottron, MD   300 mg at 12/06/23 2100   hydrALAZINE (APRESOLINE) tablet 10 mg  10 mg Oral Q12H Kirby Crigler, Mir M, MD   10 mg at 12/06/23 2100   hydrocortisone (ANUSOL-HC) suppository 25 mg  25 mg Rectal BID Marguerita Merles Latif, DO   25 mg at 12/06/23 2100   hydrocortisone cream 0.5 %   Topical BID PRN Anthoney Harada, NP       insulin aspart (novoLOG) injection 0-15 Units  0-15 Units Subcutaneous TID WC Maryln Gottron, MD   2 Units at 12/07/23 1610   insulin aspart (novoLOG) injection 0-5 Units  0-5 Units Subcutaneous QHS Kirby Crigler, Mir M, MD       isosorbide mononitrate (IMDUR) 24 hr  tablet 15 mg  15 mg Oral Daily Kirby Crigler, Mir M, MD   15 mg at 12/06/23 0907   ondansetron [facility]) tablet 4 mg  4 mg Oral Q6H PRN Kirby Crigler, Mir M, MD       Or   ondansetron [facility]) injection 4 mg  4 mg Intravenous Q6H PRN Kirby Crigler, Mir M, MD       polyethylene glycol (MIRALAX / GLYCOLAX) packet 17 g  17 g Oral BID Benancio Deeds, MD   17 g at 12/06/23 2100   rivaroxaban (XARELTO) tablet 20 mg  20 mg Oral Q supper Sheikh, Kateri Mc Latif, DO       rosuvastatin (CRESTOR) tablet 20 mg  20 mg Oral Daily Kirby Crigler, Mir M, MD   20 mg at 12/06/23 4098   spironolactone (ALDACTONE) tablet 12.5 mg  12.5 mg Oral Daily Kirby Crigler, Mir M, MD   12.5 mg at 12/06/23 0908   traZODone (DESYREL) tablet 50 mg  50 mg Oral QHS PRN Maryln Gottron, MD   50 mg at 12/02/23 2215    Allergies: Allergies  Allergen Reactions   Ace Inhibitors Swelling    Swelling of the tongue   Angiotensin Receptor Blockers Swelling    Tongue swelling   Vasotec [Enalapril] Swelling    Tongue swelling   Pravachol [Pravastatin] Other (See Comments)    Myalgias      Social History: Social History   Tobacco Use   Smoking status: Former    Types: Cigars   Smokeless tobacco: Never   Tobacco comments:    cigar occassionally  Vaping Use   Vaping status: Never Used  Substance Use Topics   Alcohol use: Yes    Comment: occasional   Drug use: No    Family History Family History  Problem Relation Age of Onset   Hypertension Mother    Aneurysm Mother    Stroke Mother    Hypertension Father    Heart disease Father        CAD   Heart disease Son        MI   Colon cancer Neg Hx    Prostate cancer Neg Hx     Review of Systems  Genitourinary:  Negative for dysuria, flank pain, frequency, hematuria and urgency.     Objective   Vital signs in last 24 hours: BP (!) 149/92 (BP Location: Left Arm)   Pulse 90   Temp 98.5 F (36.9 C) (Oral)   Resp 17   Ht 5\' 9"  (1.753 m)   Wt 70.6 kg   SpO2 92%   BMI 22.98 kg/m   Physical Exam General: NAD, A&O, resting, appropriate HEENT: [place]/AT Pulmonary: Normal work of breathing Cardiovascular: RRR, no cyanosis Abdomen: Soft, NTTP, nondistended Neuro: Appropriate, no focal neurological deficits  Most Recent Labs: Lab Results  Component Value Date   WBC 8.5 12/07/2023   HGB 10.3 (L) 12/07/2023   HCT 31.6 (L) 12/07/2023   PLT 218 12/07/2023    Lab Results  Component Value Date   NA 136 12/07/2023   K 4.1 12/07/2023   CL 100 12/07/2023   CO2 30 12/07/2023   BUN 25 (H) 12/07/2023   CREATININE 0.85 12/07/2023   CALCIUM 8.9 12/07/2023   MG 2.0 12/07/2023   PHOS 2.7 12/07/2023    Lab Results  Component Value Date   INR 1.1 (H) 04/15/2013   APTT 76 (H) 02/14/2021     Urine Culture: @[id number] (laburin,org,[id number],[id number])@   IMAGING: CT HEMATURIA WORKUP Result Date: 12/07/2023  CLINICAL DATA:  Hematuria. * Tracking Code: BO * EXAM: CT ABDOMEN AND PELVIS WITHOUT AND WITH CONTRAST TECHNIQUE: Multidetector CT imaging of the abdomen and pelvis was performed following the standard  protocol before and following the bolus administration of intravenous contrast. RADIATION DOSE REDUCTION: This exam was performed according to the departmental dose-optimization program which includes automated exposure control, adjustment of the mA and/or kV according to patient size and/or use of iterative reconstruction technique. CONTRAST:  OMNIPAQUE IOHEXOL 300 MG/ML  SOLN COMPARISON:  CT chest 11/30/2023 and CT AP 04/04/2020 FINDINGS: Lower chest: Small right pleural effusion identified. Emphysema. Patchy airspace densities identified within the posterior lung bases. Hepatobiliary: There are multifocal, ill-defined bilobar low-density liver lesions. These are new when compared with the study from 04/04/2020 this includes: -Segment 2/3 lesion measures 2.5 x 2.1 cm, image 19/4. -Segment 8 lesion measures 1.0 x 1.1 cm, image 14/4. -Segment 4B lesion measures 1.7 x 1.4 cm, image 24/4. Gallbladder appears normal. Common bile duct measures 1 cm, image 45/9. Formally 0.9 cm. No common bile duct stone or obstructing mass identified. Pancreas: Within the distal tail of pancreas there is a low-attenuation lesion which measures 3.8 x 2.5 cm, image 23/4. This abuts the posterior wall of the gastric fundus, image 23/4. This also appears to encase the splenic artery and vein. Small filling defect within the splenic vein posterior to this lesion is noted, image 42/9. 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 There does not appear to be involvement of the celiac trunk or SMA. Spleen: Solitary low-density lesion within the periphery of the upper pole of spleen measures 1.8 cm, image 10/4. 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 Adrenals/Urinary Tract: Normal adrenal glands. No nephrolithiasis or signs of obstructive uropathy. Left renal cortical scarring. Exophytic fluid density cyst off  the lateral cortex of the left kidney measures 1.6 cm and is compatible with a Bosniak class 1 cyst, image 27/4. No follow-up imaging recommended. Urinary bladder is unremarkable. Stomach/Bowel: Stomach is nondistended. No pathologic dilatation of the large or small bowel loops. The appendix is visualized and appears normal. Vascular/Lymphatic: Aortic atherosclerosis.  No aneurysm. Prominent retrocrural lymph nodes are identified bilaterally. Right retrocrural node measures 1.2 cm, image 12/4. Bilateral retroperitoneal adenopathy. Preaortic lymph node measures 1.3 cm, image 33/4. left retroperitoneal lymph node measures 1.4 cm, image 33/4. Retrocaval node measures 1.3 cm, image 31/4. Reproductive: Prostate gland enlargement. Other: Signs of peritoneal carcinomatosis identified. Small volume of perihepatic and pelvic ascites noted. Within the anterior right hemiabdomen there is a peritoneal nodule measuring 2.5 x 2.7 cm, image 37/4. Loculated fluid with surrounding enhancement in the central pelvis measures 3.2 x 1.8 cm, image 62/4. Within the central pelvis Musculoskeletal: Lumbar scoliosis and multilevel degenerative disc disease. Status post posterior decompression of the lumbar spine. Age-indeterminate superior endplate fracture deformity is identified at the T12 level with mild loss of the superior endplate, new from previous lumbar spine MRI dated 12/03/2022. IMPRESSION: 1. There is a low-attenuation lesion within the distal tail of pancreas which measures 3.8 x 2.5 cm. This abuts the posterior wall of the gastric fundus. This also appears to encase the splenic artery and vein. Small filling defect within the splenic vein posterior to this lesion is noted. 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 2. Multifocal, ill-defined bilobar low-density liver lesions are  new when compared with the study  from 04/04/2020. 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 3. Signs of peritoneal carcinomatosis identified. Small volume of perihepatic and pelvic ascites noted. Loculated fluid with surrounding enhancement in the central pelvis measures 3.2 x 1.8 cm. Peritoneal nodule identified within the anterior aspect of the right hemiabdomen 4. Bilateral retrocrural and retroperitoneal adenopathy. 5. Age-indeterminate superior endplate fracture deformity is identified at the T12 level with mild loss of the superior endplate, new from previous lumbar spine MRI dated 12/03/2022. 6. Small right pleural effusion. 7. Patchy airspace densities identified within the posterior lung bases. 8. Aortic Atherosclerosis (ICD10-I70.0) and Emphysema (ICD10-J43.9). Electronically Signed   By: Signa Kell M.D.   On: 12/07/2023 05:24    ------  Elmon Kirschner, NP Pager: [phone]   Please contact the urology consult pager with any further questions/concerns.

## 2023-12-08 ENCOUNTER — Inpatient Hospital Stay (HOSPITAL_COMMUNITY): Payer: Medicare Other

## 2023-12-08 ENCOUNTER — Other Ambulatory Visit: Payer: Self-pay | Admitting: *Deleted

## 2023-12-08 ENCOUNTER — Encounter: Payer: Self-pay | Admitting: *Deleted

## 2023-12-08 ENCOUNTER — Encounter (HOSPITAL_COMMUNITY): Payer: Self-pay | Admitting: Internal Medicine

## 2023-12-08 DIAGNOSIS — C259 Malignant neoplasm of pancreas, unspecified: Secondary | ICD-10-CM

## 2023-12-08 DIAGNOSIS — J189 Pneumonia, unspecified organism: Secondary | ICD-10-CM | POA: Diagnosis not present

## 2023-12-08 LAB — GLUCOSE, CAPILLARY
Glucose-Capillary: 125 mg/dL — ABNORMAL HIGH (ref 70–99)
Glucose-Capillary: 120 mg/dL — ABNORMAL HIGH (ref 70–99)
Glucose-Capillary: 198 mg/dL — ABNORMAL HIGH (ref 70–99)
Glucose-Capillary: 121 mg/dL — ABNORMAL HIGH (ref 70–99)

## 2023-12-08 LAB — PROTIME-INR
INR: 1.3 — ABNORMAL HIGH (ref 0.8–1.2)
Prothrombin Time: 16.2 s — ABNORMAL HIGH (ref 11.4–15.2)

## 2023-12-08 MED ORDER — NALOXONE HCL 0.4 MG/ML IJ SOLN
INTRAMUSCULAR | Status: AC
Start: 2023-12-08 — End: ?
  Filled 2023-12-08: qty 1

## 2023-12-08 MED ORDER — SODIUM CHLORIDE 0.9 % IV SOLN
INTRAVENOUS | Status: AC
  Filled 2023-12-08: qty 500

## 2023-12-08 MED ORDER — FLUMAZENIL 0.5 MG/5ML IV SOLN
INTRAVENOUS | Status: AC
Start: 2023-12-08 — End: ?
  Filled 2023-12-08: qty 5

## 2023-12-08 MED ORDER — FENTANYL CITRATE (PF) 100 MCG/2ML IJ SOLN
INTRAMUSCULAR | Status: AC | PRN
  Administered 2023-12-08: 25 ug via INTRAVENOUS

## 2023-12-08 MED ORDER — ENSURE ENLIVE PO LIQD
237.0000 mL | Freq: Two times a day (BID) | ORAL | Status: DC
  Administered 2023-12-09 – 2023-12-10 (×2): 237 mL via ORAL

## 2023-12-08 MED ORDER — METOPROLOL TARTRATE 5 MG/5ML IV SOLN: 5.0000 mg | INTRAVENOUS | Status: DC | PRN

## 2023-12-08 MED ORDER — B COMPLEX-C PO TABS
1.0000 | ORAL_TABLET | Freq: Every day | ORAL | Status: DC
  Administered 2023-12-08 – 2023-12-11 (×4): 1 via ORAL
  Filled 2023-12-08 (×4): qty 1

## 2023-12-08 MED ORDER — SODIUM CHLORIDE 0.9 % IV SOLN
INTRAVENOUS | Status: AC | PRN
  Administered 2023-12-08: 10 mL/h via INTRAVENOUS

## 2023-12-08 MED ORDER — FENTANYL CITRATE (PF) 100 MCG/2ML IJ SOLN
INTRAMUSCULAR | Status: AC
  Filled 2023-12-08: qty 2

## 2023-12-08 MED ORDER — MIDAZOLAM HCL 2 MG/2ML IJ SOLN
INTRAMUSCULAR | Status: AC
  Filled 2023-12-08: qty 2

## 2023-12-08 MED ORDER — HYDRALAZINE HCL 20 MG/ML IJ SOLN: 10.0000 mg | INTRAMUSCULAR | Status: DC | PRN

## 2023-12-08 NOTE — Plan of Care (Signed)
  Problem: Education: Goal: Knowledge of General Education information will improve Description: Including pain rating scale, medication(s)/side effects and non-pharmacologic comfort measures Outcome: Progressing   Problem: Activity: Goal: Risk for activity intolerance will decrease Outcome: Progressing   Problem: Coping: Goal: Level of anxiety will decrease Outcome: Progressing   Problem: Pain Management: Goal: General experience of comfort will improve Outcome: Progressing   Problem: Safety: Goal: Ability to remain free from injury will improve Outcome: Progressing   Problem: Skin Integrity: Goal: Risk for impaired skin integrity will decrease Outcome: Progressing

## 2023-12-08 NOTE — Progress Notes (Signed)
PT Cancellation Note  Patient Details Name: Nathan Fernandez MRN: [id number] DOB: [birth year]   Cancelled Treatment:      AM       CT-guided peritoneal mass biopsy  PM       requested to rest and "try tomorrow" , "I had surgery earlier"  Felecia Shelling  PTA Acute  Rehabilitation Services Office M-F          [phone]

## 2023-12-08 NOTE — Plan of Care (Signed)
  Problem: Safety: Goal: Ability to remain free from injury will improve Outcome: Progressing   Problem: Pain Management: Goal: General experience of comfort will improve Outcome: Progressing   Problem: Elimination: Goal: Will not experience complications related to bowel motility Outcome: Progressing

## 2023-12-08 NOTE — Procedures (Signed)
Interventional Radiology Procedure Note  Procedure: CT guided right peritoneal mass biopsy.   Complications: None EBL: None Recommendations: - Bedrest 1 hour at least, then ambulate per primary order.   - Routine wound care - Follow up pathology - Advance diet ok, per primary order  Signed,  Yvone Neu. Loreta Ave, DO, ABVM, RPVI

## 2023-12-08 NOTE — Progress Notes (Signed)
Referral for Oncology entered

## 2023-12-08 NOTE — Progress Notes (Signed)
PROGRESS NOTE    Nathan Fernandez  EXB:[id number] DOB: 12-04-1941 DOA: 11/30/2023 PCP: Joaquim Nam, MD    Brief Narrative:  82 year old with history of CAD, COPD on room air, non-insulin-dependent DM 2, DVT on Xarelto admitted for community-acquired pneumonia.  Initially also thought to be slightly volume overload requiring IV Lasix.  During hospitalization started having some rectal bleeding concerning for possible hemorrhoids, seen by GI recommended Anusol suppositories.  Patient also had hematuria, case was discussed with urology who recommended CT hematuria.  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   Assessment & Plan:  Principal Problem:   CAP (community acquired pneumonia) Active Problems:   Anticoagulated   Rectal bleeding   Hemorrhoids   Constipation    Sepsis and Acute Hypoxic Respiratory Failure due to CAP and Acute on Chronic Diastolic CHF:  Initially met sepsis criteria which is improving.  Patient has now completed 5 days of Rocephin and azithromycin.  Continue bronchodilators as needed, I-S/flutter valve.   Acute on Chronic HFrEF, improved CAD HLD Currently chest pain-free.  Previous echocardiogram has shown EF of 40%.  During hospitalization there was concerns of slight volume overload requiring IV diuretics.  Monitor and replete electrolytes.  Continue Coreg, hydralazine, Aldactone, Imdur  Pancreatic and peritoneal mass - Incidental finding seen on CT scan.  CA 19-9 - Discussed with IR, planning CT-guided biopsy today  Type 2 Diabetes Mellitus  Sliding scale and Accu-Chek.  Adjust as necessary  Hypertension Continue cardiac meds.  IV as needed   History of DVT On 0 alto   Neuropathy Continue as needed Flexeril.  3 times daily gabapentin. -Recent outpatient MRI has shown multilevel cervical spondylosis with canal narrowing.  Has outpatient follow-up appointment with neurosurgery on January 6.  Also  previous provider has discussed case with neurosurgery NP   Generalized Weakness -Presumably due to his community-acquired pneumonia, continue PT/OT = SNF   Normocytic Anemia with now lower GI bleeding in the setting of Hemorrhoids and Hematuria Stable hemoglobin   LGIB/Hematochezia, resolved -In the setting of Hemorrhoids -C/w Hydrocortisone Suppository 25 mg RC BID -GI consulted and recommending good bowel regimen starting MiraLAX daily to twice daily and titrating up as needed and continue Anusol suppositories for week   Hematuria, resolved Hematuria noted.  No evidence of UTI.  Previous provider discussed case with urology   Hypoalbuminemia Encourage oral nutrition   DVT prophylaxis: rivaroxaban (XARELTO) tablet 20 mg  Code Status: Full code Family Communication: Called Steward Drone Status is: Inpatient Remains inpatient appropriate because: Ongoing treatment for community-acquired pneumonia and mild fluid overload.  Incidental finding regarding abdominal mass.   Subjective: Patient has no any complaints this morning.  Awaiting his biopsy   Examination:  General exam: Appears calm and comfortable, cahectic frail. Respiratory system: Clear to auscultation. Respiratory effort normal. Cardiovascular system: S1 & S2 heard, RRR. No JVD, murmurs, rubs, gallops or clicks. No pedal edema. Gastrointestinal system: Abdomen is nondistended, soft and nontender. No organomegaly or masses felt. Normal bowel sounds heard. Central nervous system: Alert and oriented. No focal neurological deficits. Extremities: Symmetric 5 x 5 power. Skin: No rashes, lesions or ulcers Psychiatry: Judgement and insight appear normal. Mood & affect appropriate.            Pressure Injury 11/30/23 Sacrum Bilateral Stage 2 -  Partial thickness loss of dermis presenting as a shallow open injury with a red, pink wound bed without slough. (Active)  11/30/23 1700  Location: Sacrum  Location Orientation:  Bilateral  Staging: Stage 2 -  Partial thickness loss of dermis presenting as a shallow open injury with a red, pink wound bed without slough.  Wound Description (Comments):   Present on Admission: Yes     Diet Orders (From admission, onward)     Start     Ordered   12/08/23 1159  Diet Carb Modified Fluid consistency: Thin; Room service appropriate? Yes  Diet effective now       Question Answer Comment  Diet-HS Snack? Nothing   Calorie Level Medium 1600-2000   Fluid consistency: Thin   Room service appropriate? Yes      12/08/23 1159            Objective: Vitals:   12/08/23 0828 12/08/23 1110 12/08/23 1130 12/08/23 1238  BP: (!) 145/88 129/84 (!) 134/93 105/70  Pulse:  80 83 89  Resp:  17 16   Temp:    97.9 F (36.6 C)  TempSrc:    Oral  SpO2:  100% 99% 95%  Weight:      Height:        Intake/Output Summary (Last 24 hours) at 12/08/2023 1300 Last data filed at 12/08/2023 0515 Gross per 24 hour  Intake --  Output 350 ml  Net -350 ml   Filed Weights   12/06/23 0500 12/07/23 0500 12/08/23 0409  Weight: 70.6 kg 70.6 kg 70.1 kg    Scheduled Meds:  buPROPion  150 mg Oral Daily   carvedilol  6.25 mg Oral BID   feeding supplement  1 Container Oral TID BM   fenofibrate  54 mg Oral Daily   gabapentin  300 mg Oral TID   hydrALAZINE  10 mg Oral Q12H   hydrocortisone  25 mg Rectal BID   insulin aspart  0-15 Units Subcutaneous TID WC   insulin aspart  0-5 Units Subcutaneous QHS   isosorbide mononitrate  15 mg Oral Daily   polyethylene glycol  17 g Oral BID   [START ON 12/09/2023] rivaroxaban  20 mg Oral Q supper   rosuvastatin  20 mg Oral Daily   spironolactone  12.5 mg Oral Daily   Continuous Infusions:  Nutritional status     Body mass index is 22.82 kg/m.  Data Reviewed:   CBC: Recent Labs  Lab 12/03/23 0540 12/04/23 0545 12/05/23 0649 12/05/23 0924 12/06/23 0559 12/07/23 0519  WBC 9.3 9.3 10.6*  --  9.0 8.5  NEUTROABS 7.4 6.6 7.7  --  6.6  5.8  HGB 10.5* 9.6* 11.1* 10.3* 10.2* 10.3*  HCT 32.3* 28.4* 32.9* 30.2* 30.2* 31.6*  MCV 93.4 92.2 93.5  --  91.5 94.0  PLT 201 200 241  --  224 218   Basic Metabolic Panel: Recent Labs  Lab 12/03/23 0540 12/04/23 0545 12/05/23 0649 12/06/23 0559 12/07/23 0519  NA 136 135 135 134* 136  K 3.6 3.6 4.4 3.8 4.1  CL 100 99 100 97* 100  CO2 25 30 31 30 30   GLUCOSE 142* 123* 122* 146* 123*  BUN 27* 27* 26* 25* 25*  CREATININE 0.94 1.10 0.93 0.85 0.85  CALCIUM 8.3* 8.2* 8.6* 8.8* 8.9  MG 1.8 1.9 2.0 1.9 2.0  PHOS 2.9 2.7 2.6 3.0 2.7   GFR: Estimated Creatinine Clearance: 65.3 mL/min (by C-G formula based on SCr of 0.85 mg/dL). Liver Function Tests: Recent Labs  Lab 12/03/23 0540 12/04/23 0545 12/05/23 0649 12/06/23 0559 12/07/23 0519  AST 23 21 24 22 23   ALT 14 14 15 14  13  ALKPHOS 74 65 74 75 73  BILITOT 0.7 0.6 0.7 0.9 0.8  PROT 5.1* 4.6* 5.2* 5.1* 5.1*  ALBUMIN 2.2* 2.1* 2.3* 2.2* 2.3*   No results for input(s): "LIPASE", "AMYLASE" in the last 168 hours. No results for input(s): "AMMONIA" in the last 168 hours. Coagulation Profile: Recent Labs  Lab 12/08/23 0547  INR 1.3*   Cardiac Enzymes: No results for input(s): "CKTOTAL", "CKMB", "CKMBINDEX", "TROPONINI" in the last 168 hours. BNP (last 3 results) No results for input(s): "PROBNP" in the last 8760 hours. HbA1C: No results for input(s): "HGBA1C" in the last 72 hours. CBG: Recent Labs  Lab 12/07/23 1252 12/07/23 1732 12/07/23 2123 12/08/23 0757 12/08/23 1151  GLUCAP 215* 132* 141* 125* 120*   Lipid Profile: No results for input(s): "CHOL", "HDL", "LDLCALC", "TRIG", "CHOLHDL", "LDLDIRECT" in the last 72 hours. Thyroid Function Tests: No results for input(s): "TSH", "T4TOTAL", "FREET4", "T3FREE", "THYROIDAB" in the last 72 hours. Anemia Panel: No results for input(s): "VITAMINB12", "FOLATE", "FERRITIN", "TIBC", "IRON", "RETICCTPCT" in the last 72 hours. Sepsis Labs: Recent Labs  Lab 12/02/23 0515   PROCALCITON <0.10    Recent Results (from the past 240 hours)  Resp panel by RT-PCR (RSV, Flu A&B, Covid) Anterior Nasal Swab     Status: None   Collection Time: 11/30/23 10:19 AM   Specimen: Anterior Nasal Swab  Result Value Ref Range Status   SARS Coronavirus 2 by RT PCR NEGATIVE NEGATIVE Final    Comment: (NOTE) SARS-CoV-2 target nucleic acids are NOT DETECTED.  The SARS-CoV-2 RNA is generally detectable in upper respiratory specimens during the acute phase of infection. The lowest concentration of SARS-CoV-2 viral copies this assay can detect is 138 copies/mL. A negative result does not preclude SARS-Cov-2 infection and should not be used as the sole basis for treatment or other patient management decisions. A negative result may occur with  improper specimen collection/handling, submission of specimen other than nasopharyngeal swab, presence of viral mutation(s) within the areas targeted by this assay, and inadequate number of viral copies(<138 copies/mL). A negative result must be combined with clinical observations, patient history, and epidemiological information. The expected result is Negative.  Fact Sheet for Patients:  BloggerCourse.com  Fact Sheet for Healthcare Providers:  SeriousBroker.it  This test is no t yet approved or cleared by the [place] FDA and  has been authorized for detection and/or diagnosis of SARS-CoV-2 by FDA under an Emergency Use Authorization (EUA). This EUA will remain  in effect (meaning this test can be used) for the duration of the COVID-19 declaration under Section 564(b)(1) of the Act, 21 U.S.C.section 360bbb-3(b)(1), unless the authorization is terminated  or revoked sooner.       Influenza A by PCR NEGATIVE NEGATIVE Final   Influenza B by PCR NEGATIVE NEGATIVE Final    Comment: (NOTE) The Xpert Xpress SARS-CoV-2/FLU/RSV plus assay is intended as an aid in the diagnosis of  influenza from Nasopharyngeal swab specimens and should not be used as a sole basis for treatment. Nasal washings and aspirates are unacceptable for Xpert Xpress SARS-CoV-2/FLU/RSV testing.  Fact Sheet for Patients: BloggerCourse.com  Fact Sheet for Healthcare Providers: SeriousBroker.it  This test is not yet approved or cleared by the [place] FDA and has been authorized for detection and/or diagnosis of SARS-CoV-2 by FDA under an Emergency Use Authorization (EUA). This EUA will remain in effect (meaning this test can be used) for the duration of the COVID-19 declaration under Section 564(b)(1) of the Act, 21 U.S.C. section 360bbb-3(b)(1),  unless the authorization is terminated or revoked.     Resp Syncytial Virus by PCR NEGATIVE NEGATIVE Final    Comment: (NOTE) Fact Sheet for Patients: BloggerCourse.com  Fact Sheet for Healthcare Providers: SeriousBroker.it  This test is not yet approved or cleared by the [place] FDA and has been authorized for detection and/or diagnosis of SARS-CoV-2 by FDA under an Emergency Use Authorization (EUA). This EUA will remain in effect (meaning this test can be used) for the duration of the COVID-19 declaration under Section 564(b)(1) of the Act, 21 U.S.C. section 360bbb-3(b)(1), unless the authorization is terminated or revoked.  Performed at [facility], [address] [place], [place] [address]          Radiology Studies: CT BIOPSY Result Date: 12/08/2023 INDICATION: 82 year old male with abdominal mass referred for biopsy EXAM: CT BIOPSY MEDICATIONS: None. ANESTHESIA/SEDATION: Moderate (conscious) sedation was not employed during this procedure. A total of Versed 0 mg and Fentanyl 25 mcg was administered intravenously. Moderate Sedation Time: 0 minutes. The patient's level of consciousness and vital signs were  monitored continuously by radiology nursing throughout the procedure under my direct supervision. FLUOROSCOPY TIME:  CT COMPLICATIONS: None PROCEDURE: Informed written consent was obtained from the patient after a thorough discussion of the procedural risks, benefits and alternatives. All questions were addressed. Maximal Sterile Barrier Technique was utilized including caps, mask, sterile gowns, sterile gloves, sterile drape, hand hygiene and skin antiseptic. A timeout was performed prior to the initiation of the procedure. Patient was positioned supine on the CT gantry table. Scout CT acquired for planning purposes. Using CT guidance, a guide needle was advanced into the mass of the right abdomen. Once we confirmed needle tip position, multiple 18 gauge core biopsy were acquired. Samples placed into formalin. Needle was removed and a final CT was acquired. Sterile bandage was applied. Patient tolerated the procedure well and remained hemodynamically stable throughout. No complications were encountered and no significant blood loss. IMPRESSION: Status post CT-guided biopsy of right peritoneal abdominal mass Signed, Yvone Neu. Miachel Roux, RPVI [facility] Electronically Signed   By: Gilmer Mor D.O.   On: 12/08/2023 12:42   CT HEMATURIA WORKUP Result Date: 12/07/2023 CLINICAL DATA:  Hematuria. * Tracking Code: BO * EXAM: CT ABDOMEN AND PELVIS WITHOUT AND WITH CONTRAST TECHNIQUE: Multidetector CT imaging of the abdomen and pelvis was performed following the standard protocol before and following the bolus administration of intravenous contrast. RADIATION DOSE REDUCTION: This exam was performed according to the departmental dose-optimization program which includes automated exposure control, adjustment of the mA and/or kV according to patient size and/or use of iterative reconstruction technique. CONTRAST:  OMNIPAQUE IOHEXOL 300 MG/ML  SOLN  COMPARISON:  CT chest 11/30/2023 and CT AP 04/04/2020 FINDINGS: Lower chest: Small right pleural effusion identified. Emphysema. Patchy airspace densities identified within the posterior lung bases. Hepatobiliary: There are multifocal, ill-defined bilobar low-density liver lesions. These are new when compared with the study from 04/04/2020 this includes: -Segment 2/3 lesion measures 2.5 x 2.1 cm, image 19/4. -Segment 8 lesion measures 1.0 x 1.1 cm, image 14/4. -Segment 4B lesion measures 1.7 x 1.4 cm, image 24/4. Gallbladder appears normal. Common bile duct measures 1 cm, image 45/9. Formally 0.9 cm. No common bile duct stone or obstructing mass identified. Pancreas: Within the distal tail of pancreas there is a low-attenuation lesion which measures 3.8 x 2.5 cm, image 23/4. This abuts the posterior wall of the gastric  fundus, image 23/4. This also appears to encase the splenic artery and vein. Small filling defect within the splenic vein posterior to this lesion is noted, image 42/9. 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 There does not appear to be involvement of the celiac trunk or SMA. Spleen: Solitary low-density lesion within the periphery of the upper pole of spleen measures 1.8 cm, image 10/4. 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 Adrenals/Urinary Tract: Normal adrenal glands. No nephrolithiasis or signs of obstructive uropathy. Left renal cortical scarring. Exophytic fluid density cyst off the lateral cortex of the left kidney measures 1.6 cm and is compatible with a Bosniak class 1 cyst, image 27/4. No follow-up imaging recommended. Urinary bladder is unremarkable. Stomach/Bowel: Stomach is nondistended. No pathologic dilatation of the large or small bowel loops. The appendix is visualized and appears normal. Vascular/Lymphatic: Aortic atherosclerosis.  No aneurysm.  Prominent retrocrural lymph nodes are identified bilaterally. Right retrocrural node measures 1.2 cm, image 12/4. Bilateral retroperitoneal adenopathy. Preaortic lymph node measures 1.3 cm, image 33/4. left retroperitoneal lymph node measures 1.4 cm, image 33/4. Retrocaval node measures 1.3 cm, image 31/4. Reproductive: Prostate gland enlargement. Other: Signs of peritoneal carcinomatosis identified. Small volume of perihepatic and pelvic ascites noted. Within the anterior right hemiabdomen there is a peritoneal nodule measuring 2.5 x 2.7 cm, image 37/4. Loculated fluid with surrounding enhancement in the central pelvis measures 3.2 x 1.8 cm, image 62/4. Within the central pelvis Musculoskeletal: Lumbar scoliosis and multilevel degenerative disc disease. Status post posterior decompression of the lumbar spine. Age-indeterminate superior endplate fracture deformity is identified at the T12 level with mild loss of the superior endplate, new from previous lumbar spine MRI dated 12/03/2022. IMPRESSION: 1. There is a low-attenuation lesion within the distal tail of pancreas which measures 3.8 x 2.5 cm. This abuts the posterior wall of the gastric fundus. This also appears to encase the splenic artery and vein. Small filling defect within the splenic vein posterior to this lesion is noted. 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 2. Multifocal, ill-defined bilobar low-density liver lesions are new when compared with the study from 04/04/2020. 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 3. Signs of peritoneal carcinomatosis identified. Small volume of perihepatic and pelvic ascites noted. Loculated fluid with surrounding enhancement in the central pelvis measures 3.2 x 1.8 cm. Peritoneal nodule identified within the anterior aspect of the right hemiabdomen 4. Bilateral retrocrural and  retroperitoneal adenopathy. 5. Age-indeterminate superior endplate fracture deformity is identified at the T12 level with mild loss of the superior endplate, new from previous lumbar spine MRI dated 12/03/2022. 6. Small right pleural effusion. 7. Patchy airspace densities identified within the posterior lung bases. 8. Aortic Atherosclerosis (ICD10-I70.0) and Emphysema (ICD10-J43.9). Electronically Signed   By: Signa Kell M.D.   On: 12/07/2023 05:24           LOS: 7 days   Time spent= 35 mins    Miguel Rota, MD [facility]  If 7PM-7AM, please contact night-coverage  12/08/2023, 1:00 PM

## 2023-12-08 NOTE — Progress Notes (Signed)
Initial Nutrition Assessment  INTERVENTION:   -Ensure Plus High Protein po BID, each supplement provides 350 kcal and 20 grams of protein.   -B complex w/ Vitamin C daily  NUTRITION DIAGNOSIS:   Increased nutrient needs related to acute illness as evidenced by estimated needs.  GOAL:   Patient will meet greater than or equal to 90% of their needs  MONITOR:   Supplement acceptance, PO intake, Labs, Weight trends, I & O's, Skin  REASON FOR ASSESSMENT:   Consult Assessment of nutrition requirement/status  ASSESSMENT:   82 year old with history of CAD, COPD on room air, non-insulin-dependent DM 2, DVT on Xarelto admitted for community-acquired pneumonia.  Initially also thought to be slightly volume overload requiring IV Lasix.  During hospitalization started having some rectal bleeding concerning for possible hemorrhoids, seen by GI recommended Anusol suppositories.  Patient also had hematuria, case was discussed with urology who recommended CT hematuria.  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  12/9: admitted  Pt not in room at time of visit, no family present, will attempt to speak with patient at a later time. Per chart review, pt in IR for peritoneal biopsy.  Pt has been having significant constipation for a week and a half PTA. Bowel regimen recommended by GI. Now having BMs.  Pt has been ordered Boost Breeze, recommend Ensure for more kcals and protein.   Admission weight: 154 lbs Current weight: 154 lbs Per weight records, pt has lost 10 lbs since 8/12 (6% wt loss x 4 months, insignificant for time frame).  Medications: Aldactone, Miralax  Labs reviewed: CBGs: 120-215   NUTRITION - FOCUSED PHYSICAL EXAM:  Unable to complete at this time.  Diet Order:   Diet Order             Diet Carb Modified Fluid consistency: Thin; Room service appropriate? Yes  Diet effective now                   EDUCATION  NEEDS:   Not appropriate for education at this time  Skin:  Skin Assessment: Skin Integrity Issues: Skin Integrity Issues:: Stage II Stage II: sacrum  Last BM:  12/16 -type 4  Height:   Ht Readings from Last 1 Encounters:  12/03/23 5\' 9"  (1.753 m)    Weight:   Wt Readings from Last 1 Encounters:  12/08/23 70.1 kg    BMI:  Body mass index is 22.82 kg/m.  Estimated Nutritional Needs:   Kcal:  2000-2200  Protein:  85-100g  Fluid:  2L/day   Tilda Franco, MS, RD, LDN Inpatient Clinical Dietitian Contact via Secure chat

## 2023-12-08 NOTE — Progress Notes (Signed)
Oncology Discharge Planning Note  [facility] Address: [address], [place], [place] [address] Hours of Operation:  Lewayne Bunting, Monday - Friday  Clinic Contact Information:  [phone]  Oncology Care Team: Medical Oncologist:  Pasam  Patient Details: Name:  Nathan Fernandez, Nathan Fernandez MRN:   [id number] DOB:   07-27-1941 Reason for Current Admission: @PPROB @  Discharge Planning Narrative: Notification of admission received by inpatient team for Dorena Dew.  Discharge follow-up appointments for oncology are current and available on the AVS and MyChart.   Upon discharge from the hospital, hematology/oncology's post discharge plan of care for the outpatient setting is:   December 21, 2023 at 2:30 pm With Dr Meryl Crutch  [facility] [address] [place], [place] [address]  [phone]    Lexie Swenson will be called within two business days after discharge to review hematology/oncology's plan of care for full understanding.    Outpatient Oncology Specific Care Only: Oncology appointment transportation needs addressed?:  no Oncology medication management for symptom management addressed?:  not applicable Chemo Alert Card reviewed?:  not applicable Immunotherapy Alert Card reviewed?:  not applicable

## 2023-12-09 ENCOUNTER — Inpatient Hospital Stay (HOSPITAL_COMMUNITY): Payer: Medicare Other

## 2023-12-09 DIAGNOSIS — J189 Pneumonia, unspecified organism: Secondary | ICD-10-CM | POA: Diagnosis not present

## 2023-12-09 DIAGNOSIS — Z86711 Personal history of pulmonary embolism: Secondary | ICD-10-CM | POA: Diagnosis not present

## 2023-12-09 LAB — GLUCOSE, CAPILLARY
Glucose-Capillary: 158 mg/dL — ABNORMAL HIGH (ref 70–99)
Glucose-Capillary: 183 mg/dL — ABNORMAL HIGH (ref 70–99)
Glucose-Capillary: 168 mg/dL — ABNORMAL HIGH (ref 70–99)
Glucose-Capillary: 108 mg/dL — ABNORMAL HIGH (ref 70–99)

## 2023-12-09 LAB — SURGICAL PATHOLOGY

## 2023-12-09 LAB — BASIC METABOLIC PANEL
Anion gap: 7 (ref 5–15)
BUN: 37 mg/dL — ABNORMAL HIGH (ref 8–23)
CO2: 28 mmol/L (ref 22–32)
Calcium: 8.8 mg/dL — ABNORMAL LOW (ref 8.9–10.3)
Chloride: 99 mmol/L (ref 98–111)
Creatinine, Ser: 1.05 mg/dL (ref 0.61–1.24)
GFR, Estimated: >60 mL/min (ref >60)
Glucose, Bld: 147 mg/dL — ABNORMAL HIGH (ref 70–99)
Potassium: 4.7 mmol/L (ref 3.5–5.1)
Sodium: 134 mmol/L — ABNORMAL LOW (ref 135–145)

## 2023-12-09 LAB — CBC
HCT: 27.9 % — ABNORMAL LOW (ref 39.0–52.0)
Hemoglobin: 9.2 g/dL — ABNORMAL LOW (ref 13.0–17.0)
MCH: 31.1 pg (ref 26.0–34.0)
MCHC: 33 g/dL (ref 30.0–36.0)
MCV: 94.3 fL (ref 80.0–100.0)
Platelets: 178 10*3/uL (ref 150–400)
RBC: 2.96 MIL/uL — ABNORMAL LOW (ref 4.22–5.81)
RDW: 14.4 % (ref 11.5–15.5)
WBC: 9.9 10*3/uL (ref 4.0–10.5)
nRBC: 0 % (ref 0.0–0.2)

## 2023-12-09 LAB — PHOSPHORUS: Phosphorus: 2.9 mg/dL (ref 2.5–4.6)

## 2023-12-09 LAB — MAGNESIUM: Magnesium: 2 mg/dL (ref 1.7–2.4)

## 2023-12-09 MED ORDER — IOHEXOL 300 MG/ML  SOLN
75.0000 mL | Freq: Once | INTRAMUSCULAR | Status: AC | PRN
  Administered 2023-12-09: 75 mL via INTRAVENOUS

## 2023-12-09 MED ORDER — ENOXAPARIN SODIUM 80 MG/0.8ML IJ SOSY
65.0000 mg | PREFILLED_SYRINGE | Freq: Two times a day (BID) | INTRAMUSCULAR | Status: DC
  Administered 2023-12-09 – 2023-12-11 (×5): 65 mg via SUBCUTANEOUS
  Filled 2023-12-09 (×5): qty 0.8

## 2023-12-09 NOTE — Progress Notes (Signed)
PHARMACY - ANTICOAGULATION CONSULT NOTE  Pharmacy Consult for LMWH Indication: new PE  Allergies  Allergen Reactions   Ace Inhibitors Swelling    Swelling of the tongue   Angiotensin Receptor Blockers Swelling    Tongue swelling   Vasotec [Enalapril] Swelling    Tongue swelling   Pravachol [Pravastatin] Other (See Comments)    Myalgias     Patient Measurements: Height: 5\' 9"  (175.3 cm) Weight: 67.6 kg (149 lb 0.5 oz) IBW/kg (Calculated) : 70.7  Vital Signs: Temp: 98.1 F (36.7 C) (12/18 0937) Temp Source: Oral (12/18 0937) BP: 120/96 (12/18 0938) Pulse Rate: 90 (12/18 0937)  Labs: Recent Labs    12/07/23 0519 12/08/23 0547 12/09/23 0541  HGB 10.3*  --  9.2*  HCT 31.6*  --  27.9*  PLT 218  --  178  LABPROT  --  16.2*  --   INR  --  1.3*  --   CREATININE 0.85  --  1.05    Estimated Creatinine Clearance: 51 mL/min (by C-G formula based on SCr of 1.05 mg/dL).   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   Hosp R facial weakness   CAD (coronary artery disease)    MI, Hosp 1993, PTCA; LHC 04/18/13: Distal left main 40%, proximal LAD with several aneurysmal segments, proximal LAD 50% prior to and after aneurysmal segments, area does not appear to flow-limiting, proximal diagonal 70%, ostial circumflex 20%, proximal circumflex 20%, OM1 30-40%, proximal RCA 40%, mid RCA 30%, distal RCA 20%, EF 50% => med Rx   Cancer (HCC)    skin cancer - 2020   COPD (chronic obstructive pulmonary disease) (HCC)    DM2 (diabetes mellitus, type 2) (HCC)    DVT (deep venous thrombosis) (HCC) 12/2021   History of ETT 1998   wnl   History of hiatal hernia    History of MRI 08/04/2009   brain- atrophy sm vess dz   HLD (hyperlipidemia)    HTN (hypertension)    Myocardial infarction (HCC)    OA (osteoarthritis)    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HCC)    05/09/12 [place]: 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, large collateral  proximal to aneurysm   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 1 each 3 Past Week   Ascorbic Acid (VITAMIN C PO) Take 1 tablet by mouth daily.   Past Week   aspirin EC 81 MG tablet Take 81 mg by mouth daily. Swallow whole.   11/30/2023   B Complex CAPS Take 1 capsule by mouth daily.   Past Week   buPROPion (WELLBUTRIN SR) 150 MG 12 hr tablet Take 1 tablet (150 mg total) by mouth daily.   11/30/2023   carvedilol (COREG) 6.25 MG tablet TAKE 1 TABLET BY MOUTH TWICE A DAY 180 tablet 1 11/30/2023   Cholecalciferol (VITAMIN D-3 PO) Take 1 capsule by mouth daily.   11/30/2023   Cyanocobalamin (B-12 PO) Take 1 tablet by mouth daily.   Past Week   dorzolamide-timolol (COSOPT) 22.3-6.8 MG/ML ophthalmic solution Place 1 drop into both eyes 2 (two) times daily.   11/30/2023   ELDERBERRY PO Take 1 capsule by  mouth in the morning and at bedtime.   Past Month   fenofibrate (TRICOR) 48 MG tablet TAKE 1 TABLET BY MOUTH EVERY DAY 90 tablet 0 11/30/2023   gabapentin (NEURONTIN) 100 MG capsule Take 100 mg by mouth daily.   11/30/2023   Glucosamine-Chondroitin (GLUCOSAMINE CHONDR COMPLEX PO) Take 1 tablet by mouth daily.   Past Week   hydrALAZINE (APRESOLINE) 10 MG tablet TAKE 1 TABLET BY MOUTH EVERY 12 HOURS. 180 tablet 3 11/30/2023   isosorbide mononitrate (IMDUR) 30 MG 24 hr tablet TAKE 1/2 TABLET BY MOUTH DAILY 45 tablet 3 11/30/2023   ketoconazole (NIZORAL) 2 % cream APPLY 1 APPLICATION TOPICALLY DAILY AS NEEDED FOR IRRITATION. 30 g 2 unknown   lidocaine (XYLOCAINE) 5 % ointment Apply 1 Application topically as needed. 35.44 g 0 unknown   nitroGLYCERIN (NITROSTAT) 0.4 MG SL tablet Place 1 tablet (0.4 mg total) under the tongue every  5 (five) minutes as needed for chest pain. Up to 3 doses 25 tablet 3 unknown   polyethylene glycol powder (GLYCOLAX/MIRALAX) 17 GM/SCOOP powder Take 17 g by mouth daily as needed.   unknown   rosuvastatin (CRESTOR) 20 MG tablet TAKE 1 TABLET BY MOUTH EVERY DAY 90 tablet 1 11/30/2023   traZODone (DESYREL) 100 MG tablet Take 1-2 tablets (100-200 mg total) by mouth at bedtime. (Patient taking differently: Take 100 mg by mouth at bedtime.)   11/29/2023   VITAMIN D PO Take 1 tablet by mouth daily.   11/30/2023   XARELTO 20 MG TABS tablet TAKE 1 TABLET BY MOUTH DAILY WITH SUPPER. 30 tablet 4 11/29/2023 at 2100   cyclobenzaprine (FLEXERIL) 5 MG tablet Take 1 tablet (5 mg total) by mouth 3 (three) times daily as needed for muscle spasms. (Patient not taking: Reported on 12/01/2023)   Not Taking   oxyCODONE-acetaminophen (PERCOCET/ROXICET) 5-325 MG tablet Take 0.5-1 tablets by mouth every 6 (six) hours as needed for severe pain (pain score 7-10) (sedation caution.). (Patient not taking: Reported on 12/01/2023) 20 tablet 0 Not Taking   Scheduled:   B-complex with vitamin C  1 tablet Oral Daily   buPROPion  150 mg Oral Daily   carvedilol  6.25 mg Oral BID   enoxaparin (LOVENOX) injection  65 mg Subcutaneous BID   feeding supplement  237 mL Oral BID BM   fenofibrate  54 mg Oral Daily   gabapentin  300 mg Oral TID   hydrALAZINE  10 mg Oral Q12H   hydrocortisone  25 mg Rectal BID   insulin aspart  0-15 Units Subcutaneous TID WC   insulin aspart  0-5 Units Subcutaneous QHS   isosorbide mononitrate  15 mg Oral Daily   polyethylene glycol  17 g Oral BID   rosuvastatin  20 mg Oral Daily   spironolactone  12.5 mg Oral Daily   PRN: acetaminophen **OR** acetaminophen, albuterol, cyclobenzaprine, hydrALAZINE, hydrocortisone cream, metoprolol tartrate, ondansetron **OR** ondansetron (ZOFRAN) IV, traZODone   Assessment: 82 y.o. 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

## 2023-12-09 NOTE — Progress Notes (Addendum)
PROGRESS NOTE    KIEL MACLELLAN  ZOX:[id number] DOB: [birth year] DOA: 11/30/2023 PCP: Joaquim Nam, MD    Brief Narrative:  82 year old with history of CAD, COPD on room air, non-insulin-dependent DM 2, DVT on Xarelto admitted for community-acquired pneumonia.  Initially also thought to be slightly volume overload requiring IV Lasix.  During hospitalization started having some rectal bleeding concerning for possible hemorrhoids, seen by GI recommended Anusol suppositories.  Patient also had hematuria, case was discussed with urology who recommended CT hematuria.  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  Eventually CT chest showed concerns of multiple lung lesion, bone lesion and right-sided pulmonary embolism despite of being on Xarelto.  Xarelto was transitioned to Lovenox.   Assessment & Plan:  Principal Problem:   CAP (community acquired pneumonia) Active Problems:   Anticoagulated   Rectal bleeding   Hemorrhoids   Constipation    Sepsis and Acute Hypoxic Respiratory Failure due to CAP and Acute on Chronic Diastolic CHF:  Initially met sepsis criteria which is improving.  Patient has now completed 5 days of Rocephin and azithromycin.  Continue bronchodilators as needed, I-S/flutter valve.   Acute on Chronic HFrEF, improved CAD HLD Currently chest pain-free.  Previous echocardiogram has shown EF of 40%.  During hospitalization there was concerns of slight volume overload requiring IV diuretics.  Now appears to be euvolemic.  Continue Coreg, hydralazine, Aldactone, Imdur  Pancreatic Adenocarcinoma with mets.  -This was seen on incidental finding on CT hematuria.  Has pancreatic tail mass, peritoneal mass and liver lesions.  Status post CT-guided peritoneal biopsy on 12/17.  Discussed with outpatient oncology as well, they have made appointment for his follow-up.  CT chest showed multiple lung lesions and bone lesions. Outptn Onc  appt with Dr Arlana Pouch on Dec 30th 230pm at [facility].   Right-sided pulmonary embolism, without cor pulmonale - Developed this despite of being on Xarelto.  Given concerns of underlying malignancy, will start Lovenox.  Check lower extremity Dopplers  Should be on Lovenox indefinitely if it is not cost prohibitive  Type 2 Diabetes Mellitus  Sliding scale and Accu-Chek.  Adjust as necessary  Hypertension Continue cardiac meds.  IV as needed   History of DVT Continue Xarelto > Lovenox   Neuropathy Continue as needed Flexeril.  3 times daily gabapentin. -Recent outpatient MRI has shown multilevel cervical spondylosis with canal narrowing.  Has outpatient follow-up appointment with neurosurgery on January 6.  Also previous provider has discussed case with neurosurgery NP   Generalized Weakness -Presumably due to his community-acquired pneumonia, continue PT/OT = SNF   Normocytic Anemia with now lower GI bleeding in the setting of Hemorrhoids and Hematuria Stable hemoglobin   LGIB/Hematochezia, resolved -In the setting of Hemorrhoids -C/w Hydrocortisone Suppository 25 mg RC BID -GI consulted and recommending good bowel regimen starting MiraLAX daily to twice daily and titrating up as needed and continue Anusol suppositories for week   Hematuria, resolved Hematuria noted.  No evidence of UTI.  Previous provider discussed case with urology   Hypoalbuminemia Encourage oral nutrition   DVT prophylaxis: rivaroxaban (XARELTO) tablet 20 mg  Code Status: Full code Family Communication: Steward Drone, his wife is up-to-date Status is: Inpatient Remains inpatient appropriate because: Ongoing treatment for community-acquired pneumonia and mild fluid overload.  Incidental finding regarding abdominal mass.   Subjective: Patient, his wife Steward Drone and myself had a prolonged discussion yesterday regarding his hospital stay including new diagnosis of pancreatic mass  in the expected  workup.  All the questions have been answered by me.  Met with patient and family later in the aftnoon, updated them about biopsy results.   Examination:  General exam: Appears calm and comfortable, cahectic frail. Respiratory system: Clear to auscultation. Respiratory effort normal. Cardiovascular system: S1 & S2 heard, RRR. No JVD, murmurs, rubs, gallops or clicks. No pedal edema. Gastrointestinal system: Abdomen is nondistended, soft and nontender. No organomegaly or masses felt. Normal bowel sounds heard. Central nervous system: Alert and oriented. No focal neurological deficits. Extremities: Symmetric 5 x 5 power. Skin: No rashes, lesions or ulcers Psychiatry: Judgement and insight appear normal. Mood & affect appropriate.            Pressure Injury 11/30/23 Sacrum Bilateral Stage 2 -  Partial thickness loss of dermis presenting as a shallow open injury with a red, pink wound bed without slough. (Active)  11/30/23 1700  Location: Sacrum  Location Orientation: Bilateral  Staging: Stage 2 -  Partial thickness loss of dermis presenting as a shallow open injury with a red, pink wound bed without slough.  Wound Description (Comments):   Present on Admission: Yes     Diet Orders (From admission, onward)     Start     Ordered   12/08/23 1159  Diet Carb Modified Fluid consistency: Thin; Room service appropriate? Yes  Diet effective now       Question Answer Comment  Diet-HS Snack? Nothing   Calorie Level Medium 1600-2000   Fluid consistency: Thin   Room service appropriate? Yes      12/08/23 1159            Objective: Vitals:   12/09/23 0500 12/09/23 0937 12/09/23 0938 12/09/23 1402  BP:  (!) 120/96 (!) 120/96 105/61  Pulse:  90  86  Resp:  20    Temp:  98.1 F (36.7 C)  98 F (36.7 C)  TempSrc:  Oral    SpO2:  90%  97%  Weight: 67.6 kg     Height:        Intake/Output Summary (Last 24 hours) at 12/09/2023 1530 Last data filed at 12/09/2023 0400 Gross  per 24 hour  Intake --  Output 500 ml  Net -500 ml   Filed Weights   12/07/23 0500 12/08/23 0409 12/09/23 0500  Weight: 70.6 kg 70.1 kg 67.6 kg    Scheduled Meds:  B-complex with vitamin C  1 tablet Oral Daily   buPROPion  150 mg Oral Daily   carvedilol  6.25 mg Oral BID   enoxaparin (LOVENOX) injection  65 mg Subcutaneous BID   feeding supplement  237 mL Oral BID BM   fenofibrate  54 mg Oral Daily   gabapentin  300 mg Oral TID   hydrALAZINE  10 mg Oral Q12H   hydrocortisone  25 mg Rectal BID   insulin aspart  0-15 Units Subcutaneous TID WC   insulin aspart  0-5 Units Subcutaneous QHS   isosorbide mononitrate  15 mg Oral Daily   polyethylene glycol  17 g Oral BID   rosuvastatin  20 mg Oral Daily   spironolactone  12.5 mg Oral Daily   Continuous Infusions:  Nutritional status Signs/Symptoms: estimated needs Interventions: Ensure Enlive (each supplement provides 350kcal and 20 grams of protein), MVI Body mass index is 22.01 kg/m.  Data Reviewed:   CBC: Recent Labs  Lab 12/03/23 0540 12/04/23 0545 12/05/23 0649 12/05/23 0924 12/06/23 0559 12/07/23 0519 12/09/23 0541  WBC 9.3  9.3 10.6*  --  9.0 8.5 9.9  NEUTROABS 7.4 6.6 7.7  --  6.6 5.8  --   HGB 10.5* 9.6* 11.1* 10.3* 10.2* 10.3* 9.2*  HCT 32.3* 28.4* 32.9* 30.2* 30.2* 31.6* 27.9*  MCV 93.4 92.2 93.5  --  91.5 94.0 94.3  PLT 201 200 241  --  224 218 178   Basic Metabolic Panel: Recent Labs  Lab 12/04/23 0545 12/05/23 0649 12/06/23 0559 12/07/23 0519 12/09/23 0541  NA 135 135 134* 136 134*  K 3.6 4.4 3.8 4.1 4.7  CL 99 100 97* 100 99  CO2 30 31 30 30 28   GLUCOSE 123* 122* 146* 123* 147*  BUN 27* 26* 25* 25* 37*  CREATININE 1.10 0.93 0.85 0.85 1.05  CALCIUM 8.2* 8.6* 8.8* 8.9 8.8*  MG 1.9 2.0 1.9 2.0 2.0  PHOS 2.7 2.6 3.0 2.7 2.9   GFR: Estimated Creatinine Clearance: 51 mL/min (by C-G formula based on SCr of 1.05 mg/dL). Liver Function Tests: Recent Labs  Lab 12/03/23 0540 12/04/23 0545  12/05/23 0649 12/06/23 0559 12/07/23 0519  AST 23 21 24 22 23   ALT 14 14 15 14 13   ALKPHOS 74 65 74 75 73  BILITOT 0.7 0.6 0.7 0.9 0.8  PROT 5.1* 4.6* 5.2* 5.1* 5.1*  ALBUMIN 2.2* 2.1* 2.3* 2.2* 2.3*   No results for input(s): "LIPASE", "AMYLASE" in the last 168 hours. No results for input(s): "AMMONIA" in the last 168 hours. Coagulation Profile: Recent Labs  Lab 12/08/23 0547  INR 1.3*   Cardiac Enzymes: No results for input(s): "CKTOTAL", "CKMB", "CKMBINDEX", "TROPONINI" in the last 168 hours. BNP (last 3 results) No results for input(s): "PROBNP" in the last 8760 hours. HbA1C: No results for input(s): "HGBA1C" in the last 72 hours. CBG: Recent Labs  Lab 12/08/23 1151 12/08/23 1705 12/08/23 2135 12/09/23 0759 12/09/23 1119  GLUCAP 120* 198* 121* 158* 183*   Lipid Profile: No results for input(s): "CHOL", "HDL", "LDLCALC", "TRIG", "CHOLHDL", "LDLDIRECT" in the last 72 hours. Thyroid Function Tests: No results for input(s): "TSH", "T4TOTAL", "FREET4", "T3FREE", "THYROIDAB" in the last 72 hours. Anemia Panel: No results for input(s): "VITAMINB12", "FOLATE", "FERRITIN", "TIBC", "IRON", "RETICCTPCT" in the last 72 hours. Sepsis Labs: No results for input(s): "PROCALCITON", "LATICACIDVEN" in the last 168 hours.  Recent Results (from the past 240 hours)  Resp panel by RT-PCR (RSV, Flu A&B, Covid) Anterior Nasal Swab     Status: None   Collection Time: 11/30/23 10:19 AM   Specimen: Anterior Nasal Swab  Result Value Ref Range Status   SARS Coronavirus 2 by RT PCR NEGATIVE NEGATIVE Final    Comment: (NOTE) SARS-CoV-2 target nucleic acids are NOT DETECTED.  The SARS-CoV-2 RNA is generally detectable in upper respiratory specimens during the acute phase of infection. The lowest concentration of SARS-CoV-2 viral copies this assay can detect is 138 copies/mL. A negative result does not preclude SARS-Cov-2 infection and should not be used as the sole basis for treatment  or other patient management decisions. A negative result may occur with  improper specimen collection/handling, submission of specimen other than nasopharyngeal swab, presence of viral mutation(s) within the areas targeted by this assay, and inadequate number of viral copies(<138 copies/mL). A negative result must be combined with clinical observations, patient history, and epidemiological information. The expected result is Negative.  Fact Sheet for Patients:  BloggerCourse.com  Fact Sheet for Healthcare Providers:  SeriousBroker.it  This test is no t yet approved or cleared by the [place] and  has been authorized for detection and/or diagnosis of SARS-CoV-2 by FDA under an Emergency Use Authorization (EUA). This EUA will remain  in effect (meaning this test can be used) for the duration of the COVID-19 declaration under Section 564(b)(1) of the Act, 21 U.S.C.section 360bbb-3(b)(1), unless the authorization is terminated  or revoked sooner.       Influenza A by PCR NEGATIVE NEGATIVE Final   Influenza B by PCR NEGATIVE NEGATIVE Final    Comment: (NOTE) The Xpert Xpress SARS-CoV-2/FLU/RSV plus assay is intended as an aid in the diagnosis of influenza from Nasopharyngeal swab specimens and should not be used as a sole basis for treatment. Nasal washings and aspirates are unacceptable for Xpert Xpress SARS-CoV-2/FLU/RSV testing.  Fact Sheet for Patients: BloggerCourse.com  Fact Sheet for Healthcare Providers: SeriousBroker.it  This test is not yet approved or cleared by the [place] FDA and has been authorized for detection and/or diagnosis of SARS-CoV-2 by FDA under an Emergency Use Authorization (EUA). This EUA will remain in effect (meaning this test can be used) for the duration of the COVID-19 declaration under Section 564(b)(1) of the Act, 21 U.S.C. section  360bbb-3(b)(1), unless the authorization is terminated or revoked.     Resp Syncytial Virus by PCR NEGATIVE NEGATIVE Final    Comment: (NOTE) Fact Sheet for Patients: BloggerCourse.com  Fact Sheet for Healthcare Providers: SeriousBroker.it  This test is not yet approved or cleared by the [place] FDA and has been authorized for detection and/or diagnosis of SARS-CoV-2 by FDA under an Emergency Use Authorization (EUA). This EUA will remain in effect (meaning this test can be used) for the duration of the COVID-19 declaration under Section 564(b)(1) of the Act, 21 U.S.C. section 360bbb-3(b)(1), unless the authorization is terminated or revoked.  Performed at [facility], [address], [place], [place] [address]          Radiology Studies: CT CHEST W CONTRAST Result Date: 12/09/2023 CLINICAL DATA:  Pancreatic tail mass suspicious for primary pancreatic malignancy with evidence of metastatic disease to the liver and peritoneum. Chest staging. * Tracking Code: BO * EXAM: CT CHEST WITH CONTRAST TECHNIQUE: Multidetector CT imaging of the chest was performed during intravenous contrast administration. RADIATION DOSE REDUCTION: This exam was performed according to the departmental dose-optimization program which includes automated exposure control, adjustment of the mA and/or kV according to patient size and/or use of iterative reconstruction technique. CONTRAST:  75mL OMNIPAQUE IOHEXOL 300 MG/ML  SOLN COMPARISON:  12/06/2023 CT abdomen/pelvis.  11/30/2023 chest CT. FINDINGS: Cardiovascular: Normal heart size. No significant pericardial effusion/thickening. Three-vessel coronary atherosclerosis. Atherosclerotic nonaneurysmal thoracic aorta. Normal caliber pulmonary arteries. Acute lobar and segmental right middle lobe (series 2/image 91) and segmental and subsegmental multifocal right lower lobe (series 2/images 96 and 109)  pulmonary emboli. No saddle emboli. Mediastinum/Nodes: No significant thyroid nodules. Unremarkable esophagus. No axillary adenopathy. Bilateral paratracheal adenopathy up to 2.3 cm on the right (series 2/image 64), unchanged from recent 11/30/2023 chest CT. Stable mildly enlarged 1.2 cm subcarinal node (series 2/image 76). Stable mildly enlarged bilateral retrocrural nodes, for example 1.3 cm on the right (series 2/image 158), unchanged. Mildly enlarged 1.1 cm right hilar node (series 2/image 88), unchanged. No discrete left hilar adenopathy. Lungs/Pleura: No pneumothorax. Trace posterior right pleural effusion is unchanged. No left pleural effusion. Severe centrilobular emphysema with diffuse bronchial wall thickening. Patchy consolidation in the posterior right lower lobe is similar. Numerous (greater than 10) indistinct solid pulmonary nodules scattered throughout both lungs, not  substantially changed from recent chest CT, for example 0.9 cm in the posterior left lower lobe (series 8/image 99) and 0.8 cm in the anterior left upper lobe (series 8/image 68), new from more remote 11/26/2020 screening chest CT. Upper abdomen: Small volume upper abdominal ascites, unchanged from recent CT abdomen study. Several indistinct hypodense liver masses scattered throughout the visualized liver up to 2.5 cm in the lateral segment left liver on series 2/image 149, not substantially changed from recent CT abdomen study. Limited visualization of known pancreatic tail mass. Musculoskeletal: Small sclerotic lesions in the manubrium (series 7/image 90) and posterior T6 vertebral body (series 7/image 91), new from 11/26/2020 CT. Mild superior T12 vertebral compression fracture, unchanged from 11/30/2023 chest CT. Marked thoracic spondylosis. IMPRESSION: 1. Acute lobar and segmental right middle lobe and segmental and subsegmental multifocal right lower lobe pulmonary emboli. No saddle emboli. 2. Numerous (greater than 10) indistinct  solid pulmonary nodules scattered throughout both lungs, largest 0.9 cm in the left lower lobe, not substantially changed from recent 11/30/2023 chest CT, new from more remote 11/26/2020 screening chest CT, compatible with pulmonary metastases. 3. Stable mild mediastinal, bilateral retrocrural and right hilar adenopathy since 11/30/2023 chest CT, likely nodal metastases. 4. Small sclerotic lesions in the manubrium and posterior T6 vertebral body, new from 11/26/2020 CT, suspect osseous metastases. 5. Trace posterior right pleural effusion, unchanged. Patchy consolidation in the posterior right lower lobe is similar, which could represent atelectasis or mild pneumonia. 6. Please see the recent CT abdomen/pelvis report from 12/06/2023 for details regarding the upper abdominal findings. 7. Three-vessel coronary atherosclerosis. 8. Aortic Atherosclerosis (ICD10-I70.0) and Emphysema (ICD10-J43.9). Critical Value/emergent results were called by telephone at the time of interpretation on 12/09/2023 at 8:53 am to provider [facility] , who verbally acknowledged these results. Electronically Signed   By: Delbert Phenix M.D.   On: 12/09/2023 08:57   CT BIOPSY Result Date: 12/08/2023 INDICATION: 82 year old male with abdominal mass referred for biopsy EXAM: CT BIOPSY MEDICATIONS: None. ANESTHESIA/SEDATION: Moderate (conscious) sedation was not employed during this procedure. A total of Versed 0 mg and Fentanyl 25 mcg was administered intravenously. Moderate Sedation Time: 0 minutes. The patient's level of consciousness and vital signs were monitored continuously by radiology nursing throughout the procedure under my direct supervision. FLUOROSCOPY TIME:  CT COMPLICATIONS: None PROCEDURE: Informed written consent was obtained from the patient after a thorough discussion of the procedural risks, benefits and alternatives. All questions were addressed. Maximal Sterile Barrier Technique was utilized including caps, mask, sterile  gowns, sterile gloves, sterile drape, hand hygiene and skin antiseptic. A timeout was performed prior to the initiation of the procedure. Patient was positioned supine on the CT gantry table. Scout CT acquired for planning purposes. Using CT guidance, a guide needle was advanced into the mass of the right abdomen. Once we confirmed needle tip position, multiple 18 gauge core biopsy were acquired. Samples placed into formalin. Needle was removed and a final CT was acquired. Sterile bandage was applied. Patient tolerated the procedure well and remained hemodynamically stable throughout. No complications were encountered and no significant blood loss. IMPRESSION: Status post CT-guided biopsy of right peritoneal abdominal mass Signed, Yvone Neu. Miachel Roux, RPVI [facility] Electronically Signed   By: Gilmer Mor D.O.   On: 12/08/2023 12:42           LOS: 8 days   Time spent= 35 mins    Kel Senn Zoila Shutter, MD [facility]  If 7PM-7AM,  please contact night-coverage  12/09/2023, 3:30 PM

## 2023-12-09 NOTE — TOC Progression Note (Signed)
Transition of Care [facility]) - Progression Note    Patient Details  Name: Nathan Fernandez MRN: [id number] Date of Birth: 06/26/1941  Transition of [facility]) CM/SW Contact  Otelia Santee, LCSW Phone Number: 12/09/2023, 10:42 AM  Clinical Narrative:    CSW to request insurance auth for SNF once more current PT session completed with pt.    Expected Discharge Plan: Skilled Nursing Facility Barriers to Discharge: Continued Medical Work up, SNF Pending bed offer  Expected Discharge Plan and Services In-house Referral: Clinical Social Work Discharge Planning Services: NA Post Acute Care Choice: Skilled Nursing Facility Living arrangements for the past 2 months: Single Family Home                 DME Arranged: N/A DME Agency: NA                   Social Determinants of Health (SDOH) Interventions SDOH Screenings   Food Insecurity: No Food Insecurity (11/30/2023)  Housing: Patient Declined (11/30/2023)  Transportation Needs: No Transportation Needs (11/30/2023)  Utilities: Not At Risk (11/30/2023)  Alcohol Screen: Low Risk  (11/24/2023)  Depression (PHQ2-9): Low Risk  (11/24/2023)  Financial Resource Strain: Low Risk  (11/24/2023)  Physical Activity: Inactive (11/24/2023)  Social Connections: Socially Integrated (11/24/2023)  Stress: Stress Concern Present (11/24/2023)  Tobacco Use: Medium Risk (11/30/2023)  Health Literacy: Adequate Health Literacy (11/24/2023)    Readmission Risk Interventions     No data to display

## 2023-12-09 NOTE — Progress Notes (Signed)
BLE venous duplex has been completed.  Preliminary results given to Dr.  Nelson Chimes.   Results can be found under chart review under CV PROC. 12/09/2023 3:41 PM Kili Gracy RVT, RDMS

## 2023-12-09 NOTE — Progress Notes (Signed)
Occupational Therapy Treatment Patient Details Name: Nathan Fernandez MRN: [id number] DOB: 02/01/1941 Today's Date: 12/09/2023   History of present illness Pt is an 82 yr old male admitted to the hospital with community-acquired pneumonia, weakness, and falls. Past medical history significant for CAD, COPD on room air, non-insulin-dependent type 2 diabetes, history of DVT on Xarelto, MI, OA, CVA, and lumbar laminectomy/decompression microdiscectomy.   OT comments  Pt was assisted into upright sitting EOB. He was further instructed on deep breathing exercises using an incentive spirometer, to promote lung expansion and improved overall activity tolerance needed for progressive ADL participation. He was then assisted into standing, requiring support of a RW, as well as with short distance ambulation then to the bedside chair to promote out of bed activity and tolerance. He reported general fatigue and compromised endurance. Continue OT plan of care. Patient will benefit from continued inpatient follow up therapy, <3 hours/day.       If plan is discharge home, recommend the following:  Assist for transportation;Assistance with cooking/housework;Help with stairs or ramp for entrance;A little help with bathing/dressing/bathroom   Equipment Recommendations  Other (comment) (defer to next level of care)    Recommendations for Other Services      Precautions / Restrictions Precautions Precautions: Fall Restrictions Weight Bearing Restrictions Per Provider Order: No Other Position/Activity Restrictions: monitor O2 saturation       Mobility Bed Mobility Overal bed mobility: Needs Assistance Bed Mobility: Supine to Sit     Supine to sit: Supervision          Transfers Overall transfer level: Needs assistance Equipment used: Rolling walker (2 wheels) Transfers: Sit to/from Stand Sit to Stand: Contact guard assist, From elevated surface                 Balance     Sitting  balance-Leahy Scale: Good       Standing balance-Leahy Scale: Fair         ADL either performed or assessed with clinical judgement      Cognition Arousal: Alert Behavior During Therapy: WFL for tasks assessed/performed Overall Cognitive Status: Within Functional Limits for tasks assessed                      Pertinent Vitals/ Pain       Pain Assessment Pain Location: He did not report having pain during the session.         Frequency  Min 1X/week        Progress Toward Goals  OT Goals(current goals can now be found in the care plan section)     Acute Rehab OT Goals OT Goal Formulation: With patient/family Time For Goal Achievement: 12/15/23 Potential to Achieve Goals: Good  Plan         AM-PAC OT "6 Clicks" Daily Activity     Outcome Measure   Help from another person eating meals?: None Help from another person taking care of personal grooming?: A Little Help from another person toileting, which includes using toliet, bedpan, or urinal?: A Little Help from another person bathing (including washing, rinsing, drying)?: A Lot Help from another person to put on and taking off regular upper body clothing?: A Little Help from another person to put on and taking off regular lower body clothing?: A Little 6 Click Score: 18    End of Session Equipment Utilized During Treatment: Oxygen;Rolling walker (2 wheels)  OT Visit Diagnosis: Unsteadiness on feet (R26.81);Muscle weakness (generalized) (M62.81)  Activity Tolerance Patient limited by fatigue   Patient Left in chair;with call bell/phone within reach;with chair alarm set;with family/visitor present   Nurse Communication Mobility status        Time: 2130-8657 OT Time Calculation (min): 16 min  Charges: OT General Charges $OT Visit: 1 Visit OT Treatments $Therapeutic Activity: 8-22 mins    Reuben Likes, OTR/L 12/09/2023, 5:38 PM

## 2023-12-09 NOTE — Plan of Care (Signed)
  Problem: Nutrition: Goal: Adequate nutrition will be maintained Outcome: Progressing   Problem: Clinical Measurements: Goal: Will remain free from infection Outcome: Progressing   Problem: Pain Management: Goal: General experience of comfort will improve Outcome: Progressing

## 2023-12-10 DIAGNOSIS — J189 Pneumonia, unspecified organism: Secondary | ICD-10-CM | POA: Diagnosis not present

## 2023-12-10 DIAGNOSIS — C259 Malignant neoplasm of pancreas, unspecified: Secondary | ICD-10-CM

## 2023-12-10 DIAGNOSIS — K922 Gastrointestinal hemorrhage, unspecified: Secondary | ICD-10-CM | POA: Diagnosis not present

## 2023-12-10 DIAGNOSIS — J9601 Acute respiratory failure with hypoxia: Secondary | ICD-10-CM | POA: Diagnosis not present

## 2023-12-10 DIAGNOSIS — D649 Anemia, unspecified: Secondary | ICD-10-CM | POA: Diagnosis not present

## 2023-12-10 LAB — CBC
HCT: 26.2 % — ABNORMAL LOW (ref 39.0–52.0)
Hemoglobin: 8.7 g/dL — ABNORMAL LOW (ref 13.0–17.0)
MCH: 31.5 pg (ref 26.0–34.0)
MCHC: 33.2 g/dL (ref 30.0–36.0)
MCV: 94.9 fL (ref 80.0–100.0)
Platelets: 187 10*3/uL (ref 150–400)
RBC: 2.76 MIL/uL — ABNORMAL LOW (ref 4.22–5.81)
RDW: 14.6 % (ref 11.5–15.5)
WBC: 8.7 10*3/uL (ref 4.0–10.5)
nRBC: 0 % (ref 0.0–0.2)

## 2023-12-10 LAB — GLUCOSE, CAPILLARY
Glucose-Capillary: 121 mg/dL — ABNORMAL HIGH (ref 70–99)
Glucose-Capillary: 167 mg/dL — ABNORMAL HIGH (ref 70–99)
Glucose-Capillary: 144 mg/dL — ABNORMAL HIGH (ref 70–99)
Glucose-Capillary: 157 mg/dL — ABNORMAL HIGH (ref 70–99)

## 2023-12-10 LAB — BASIC METABOLIC PANEL
Anion gap: 8 (ref 5–15)
BUN: 46 mg/dL — ABNORMAL HIGH (ref 8–23)
CO2: 28 mmol/L (ref 22–32)
Calcium: 8.7 mg/dL — ABNORMAL LOW (ref 8.9–10.3)
Chloride: 98 mmol/L (ref 98–111)
Creatinine, Ser: 1.34 mg/dL — ABNORMAL HIGH (ref 0.61–1.24)
GFR, Estimated: 53 mL/min — ABNORMAL LOW (ref >60)
Glucose, Bld: 133 mg/dL — ABNORMAL HIGH (ref 70–99)
Potassium: 4.9 mmol/L (ref 3.5–5.1)
Sodium: 134 mmol/L — ABNORMAL LOW (ref 135–145)

## 2023-12-10 LAB — CANCER ANTIGEN 19-9: CA 19-9: 10698 U/mL — ABNORMAL HIGH (ref 0–35)

## 2023-12-10 LAB — MAGNESIUM: Magnesium: 2.2 mg/dL (ref 1.7–2.4)

## 2023-12-10 NOTE — TOC Progression Note (Signed)
Transition of Care [facility]) - Progression Note    Patient Details  Name: AKEIL STEER MRN: [id number] Date of Birth: Mar 05, 1941  Transition of Care [facility]) CM/SW Contact  Otelia Santee, LCSW Phone Number: 12/10/2023, 10:40 AM  Clinical Narrative:    Insurance auth requested and is currently pending.    Expected Discharge Plan: Skilled Nursing Facility Barriers to Discharge: Continued Medical Work up, SNF Pending bed offer  Expected Discharge Plan and Services In-house Referral: Clinical Social Work Discharge Planning Services: NA Post Acute Care Choice: Skilled Nursing Facility Living arrangements for the past 2 months: Single Family Home                 DME Arranged: N/A DME Agency: NA                   Social Determinants of Health (SDOH) Interventions SDOH Screenings   Food Insecurity: No Food Insecurity (11/30/2023)  Housing: Patient Declined (11/30/2023)  Transportation Needs: No Transportation Needs (11/30/2023)  Utilities: Not At Risk (11/30/2023)  Alcohol Screen: Low Risk  (11/24/2023)  Depression (PHQ2-9): Low Risk  (11/24/2023)  Financial Resource Strain: Low Risk  (11/24/2023)  Physical Activity: Inactive (11/24/2023)  Social Connections: Socially Integrated (11/24/2023)  Stress: Stress Concern Present (11/24/2023)  Tobacco Use: Medium Risk (11/30/2023)  Health Literacy: Adequate Health Literacy (11/24/2023)    Readmission Risk Interventions     No data to display

## 2023-12-10 NOTE — Progress Notes (Signed)
Physical Therapy Treatment Patient Details Name: Nathan Fernandez MRN: [id number] DOB: 1941/09/08 Today's Date: 12/10/2023   History of Present Illness Pt is an 82 y.o. male admitted to the hospital with community-acquired pneumonia, weakness, and falls. Past medical history significant for CAD, COPD on room air, non-insulin-dependent type 2 diabetes, history of DVT on Xarelto, MI, OA, CVA, and lumbar laminectomy/decompression microdiscectomy.    PT Comments  General Comments: AxO x 3 very pleasant and willing but feeling "wore out" with MAX c/o fatigue Pt in bed on 2 lts sats 93%.  Assisted OOB to amb was difficult.  General bed mobility comments: no use of bed rails, HOB slightly elevated. Incr time sitting EOB for recovery due to fatigue. General transfer comment: use of rail and unsteady with c/o B LE weakness.  Increased balance instability with turns and side stepping along bed. General Gait Details: Limited activity tolerance with decreased amb distance due to increased c/o weakness and fatigue.  HIGH FALL RISK. Pt required 4 lts oxygen during activity to achieve sats >88%.  Dyspnea was 3/4. Assisted back to bed per pt request to rest.  Remained on 4 lts for 5 min before able to wean back down to 2 lts sats 90%.   Pt is VERY deconditioned and will require ST Rehab @ SNF to address endurance, strengthening prior to safe D/C back home.     If plan is discharge home, recommend the following: A little help with walking and/or transfers;A little help with bathing/dressing/bathroom;Help with stairs or ramp for entrance;Assistance with cooking/housework;Assist for transportation   Can travel by private vehicle     Yes  Equipment Recommendations       Recommendations for Other Services       Precautions / Restrictions Precautions Precautions: Fall Restrictions Weight Bearing Restrictions Per Provider Order: No Other Position/Activity Restrictions: monitor O2 saturation     Mobility  Bed  Mobility Overal bed mobility: Needs Assistance Bed Mobility: Supine to Sit, Sit to Supine     Supine to sit: Supervision Sit to supine: Supervision   General bed mobility comments: no use of bed rails, HOB slightly elevated. Incr time sitting EOB for recovery due to fatigue.    Transfers Overall transfer level: Needs assistance   Transfers: Sit to/from Stand Sit to Stand: Contact guard assist, From elevated surface, Min assist           General transfer comment: use of rail and unsteady with c/o B LE weakness.  Increased balance instability with turns and side stepping along bed.    Ambulation/Gait Ambulation/Gait assistance: Min assist, Mod assist Gait Distance (Feet): 18 Feet Assistive device: Rolling walker (2 wheels) Gait Pattern/deviations: Step-through pattern, Decreased stride length Gait velocity: decreased     General Gait Details: Limited activity tolerance with decreased amb distance due to increased c/o weakness and fatigue.  Pt required 4 lts oxygen during activity to achieve sats >88%.  Dyspnea was 3/4.   Stairs             Wheelchair Mobility     Tilt Bed    Modified Rankin (Stroke Patients Only)       Balance                                            Cognition Arousal: Alert Behavior During Therapy: WFL for tasks assessed/performed Overall Cognitive Status: Within Functional  Limits for tasks assessed                                 General Comments: AxO x 3 very pleasant and willing but feeling "wore out" with MAX c/o fatigue        Exercises      General Comments        Pertinent Vitals/Pain Pain Assessment Pain Assessment: No/denies pain    Home Living                          Prior Function            PT Goals (current goals can now be found in the care plan section) Progress towards PT goals: Progressing toward goals    Frequency    Min 1X/week      PT Plan       Co-evaluation              AM-PAC PT "6 Clicks" Mobility   Outcome Measure  Help needed turning from your back to your side while in a flat bed without using bedrails?: A Little Help needed moving from lying on your back to sitting on the side of a flat bed without using bedrails?: A Little Help needed moving to and from a bed to a chair (including a wheelchair)?: A Little Help needed standing up from a chair using your arms (e.g., wheelchair or bedside chair)?: A Lot Help needed to walk in hospital room?: A Lot Help needed climbing 3-5 steps with a railing? : Total 6 Click Score: 14    End of Session Equipment Utilized During Treatment: Gait belt;Oxygen Activity Tolerance: Patient limited by fatigue Patient left: in bed;with call bell/phone within reach Nurse Communication: Mobility status PT Visit Diagnosis: Unsteadiness on feet (R26.81);Muscle weakness (generalized) (M62.81);Repeated falls (R29.6)     Time: 0935-1000 PT Time Calculation (min) (ACUTE ONLY): 25 min  Charges:    $Gait Training: 8-22 mins $Therapeutic Activity: 8-22 mins PT General Charges $$ ACUTE PT VISIT: 1 Visit                    Felecia Shelling  PTA Acute  Rehabilitation Services Office M-F          [phone]

## 2023-12-10 NOTE — Progress Notes (Signed)
PROGRESS NOTE    Nathan Fernandez  UEA:[id number] DOB: 1941-04-24 DOA: 11/30/2023 PCP: Joaquim Nam, MD   Brief Narrative:  The patient is 82 year old with history of CAD, COPD on room air, non-insulin-dependent DM 2, Hx of DVT on Xarelto admitted for community-acquired pneumonia and Acute on Chronic Diastolic CHF.  Initially also thought to be slightly volume overload requiring IV Lasix.  During hospitalization started having some rectal bleeding concerning for possible hemorrhoids, seen by GI recommended Anusol suppositories.  Patient also had hematuria, case was discussed with urology who recommended CT hematuria.  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  Eventually CT chest showed concerns of multiple lung lesion, bone lesion and right-sided pulmonary embolism despite of being on Xarelto.  Xarelto was transitioned to Lovenox.  Outpatient appointment with medical oncology has been arranged.  Currently PT OT recommending SNF and currently insurance did not but authorization is pending  Assessment & Plan:  Principal Problem:   CAP (community acquired pneumonia) Active Problems:   Anticoagulated   Rectal bleeding   Hemorrhoids   Constipation   Sepsis and Acute Hypoxic Respiratory Failure due to CAP and Acute on Chronic Diastolic CHF:  -Initially met sepsis criteria which is improving.  Patient has now completed 5 days of Rocephin and azithromycin.  Continue bronchodilators as needed, I-S/flutter valve. -PCT was less than 0.10 -SpO2: 96 % O2 Flow Rate (L/min): 3 L/min -Continue supplemental oxygen via nasal cannula and wean O2 as tolerated -Continuous pulse oximetry and maintain O2 saturations greater than 90% -Patient desaturated on his ambulatory home O2 screen previously  Acute on Chronic HFrEF, improved CAD HLD -Currently chest pain-free.  Previous echocardiogram has shown EF of 40%.  -During hospitalization there was concerns of  slight volume overload requiring IV diuretics.   Intake/Output Summary (Last 24 hours) at 12/10/2023 2009 Last data filed at 12/10/2023 0800 Gross per 24 hour  Intake --  Output 100 ml  Net -100 ml  -Now appears to be euvolemic.  Continue Coreg, hydralazine, Aldactone, Imdur  AKI -Mild. BUN/Cr Trend: Recent Labs  Lab 12/03/23 0540 12/04/23 0545 12/05/23 0649 12/06/23 0559 12/07/23 0519 12/09/23 0541 12/10/23 0602  BUN 27* 27* 26* 25* 25* 37* 46*  CREATININE 0.94 1.10 0.93 0.85 0.85 1.05 1.34*  -Was diuresed with diuresis has been stopped.  If necessary will hold spironolactone but continue to monitor for now -Avoid Nephrotoxic Medications, Contrast Dyes, Hypotension and Dehydration to Ensure Adequate Renal Perfusion and will need to Renally Adjust Meds -Continue to Monitor and Trend Renal Function carefully and repeat CMP in the AM    Pancreatic Adenocarcinoma with Metastasis.  -This was seen on incidental finding on CT hematuria.  Has pancreatic tail mass, peritoneal mass and liver lesions.   -Status post CT-guided peritoneal biopsy on 12/17 which showed "Moderately differentiated adenocarcinoma morphologically consistent  with a pancreatic primary." -Dr. Nelson Chimes Discussed with outpatient oncology as well, they have made appointment for his follow-up.   -CT chest showed multiple lung lesions and bone lesions. -Patient has a outpatient Oncology Appt with Dr Arlana Pouch on Dec 30th 230pm at [facility]/ [facility].   Right-sided pulmonary embolism, without cor pulmonale -Developed this despite of being on Xarelto.  Given concerns of underlying malignancy, will start Lovenox.  LE duplex as below -Should be on Lovenox indefinitely if it is not cost prohibitive  Type 2 Diabetes Mellitus  -Continue sliding scale and Accu-Chek.  Adjust as necessary -CBG Trend: Recent Labs  Lab 12/09/23 0759 12/09/23 1119 12/09/23 1717 12/09/23 2122 12/10/23 0756 12/10/23 1126 12/10/23 1727   GLUCAP 158* 183* 168* 108* 121* 167* 144*   Hypertension -Continue home Spironolactone 12.5 mg po Daily, Isosorbide Mononitrate 15 mg po Daily and Carvedilol 6.25 mg po BID  -Continue monitor blood pressures per protocol -Last blood pressure reading was 131/74   Neuropathy Continue as needed Flexeril.  3 times daily gabapentin. -Recent outpatient MRI has shown multilevel cervical spondylosis with canal narrowing.  Has outpatient follow-up appointment with neurosurgery on January 6.     Generalized Weakness -Presumably due to his community-acquired pneumonia, continue PT/OT recommending SNF SNF   Normocytic Anemia with now lower GI bleeding in the setting of Hemorrhoids and Hematuria -Hgb/Hct Trend: Recent Labs  Lab 12/04/23 0545 12/05/23 0649 12/05/23 0924 12/06/23 0559 12/07/23 0519 12/09/23 0541 12/10/23 0602  HGB 9.6* 11.1* 10.3* 10.2* 10.3* 9.2* 8.7*  HCT 28.4* 32.9* 30.2* 30.2* 31.6* 27.9* 26.2*  MCV 92.2 93.5  --  91.5 94.0 94.3 94.9  -Checked Anemia Panel and showed an iron level of 36, UIBC of 135, TIBC 171, saturation ratios of 21%, ferritin level 761, folate levels 12.5 and vitamin B12 873 -Changed anticoagulation to subcu Lovenox -Continue to Monitor for S/Sx of Bleeding; previously had hematuria and hematochezia which have resolved -Repeat CBC in the AM   Hx of DVT -Was on anticoagulation with rivaroxaban 20 mg p.o. daily but was held given his FOBT and his hematuria but now has been changed to subcu Lovenox -Repeat LE duplex done and showed "RIGHT: Findings consistent with acute deep vein thrombosis involving the right peroneal veins, and right popliteal vein. LEFT: There is no evidence of deep vein thrombosis in the lower extremity. 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 Inflow velocity 11 cm/s. Outflow 13 cm/s. Collateral seen  proximal to aneurysm. Seen on previous ultrasound from 05/09/2022. Distal  ATA, PTA, and PeroA are  all patent at the level of the ankle." -Continue with anticoagulation as above   LGIB/Hematochezia, resolved -In the setting of Hemorrhoids -C/w Hydrocortisone Suppository 25 mg RC BID -GI consulted and recommending good bowel regimen starting MiraLAX daily to twice daily and titrating up as needed and continue Anusol suppositories for week   Hematuria, resolved -Hematuria noted.  No evidence of UTI.  I had spoken with Dr. Ma Rings who recommended a CT hematuria study  Generalized weakness and deconditioning -In the setting of metastatic cancer community-acquired pneumonia along with his other comorbidities -PT OT recommending SNF   Hypoalbuminemia -Patient's Albumin Trend: Recent Labs  Lab 11/13/23 1155 12/02/23 0513 12/03/23 0540 12/04/23 0545 12/05/23 0649 12/06/23 0559 12/07/23 0519  ALBUMIN 3.1* 2.2* 2.2* 2.1* 2.3* 2.2* 2.3*  -Continue to Monitor and Trend and repeat CMP in the AM   DVT prophylaxis: Anticoagulated with Lovenox    Code Status: Full Code Family Communication: No family currently at bedside currently  Disposition Plan:  Level of care: Med-Surg Status is: Inpatient Remains inpatient appropriate because: Needs insurance authorization and bed availability  Consultants:  Discussed with neurosurgery Gastroenterology Urology My colleague discussed with outpatient oncology  Procedures:  As delineated as above  Antimicrobials:  Anti-infectives (From admission, onward)    Start     Dose/Rate Route Frequency Ordered Stop   12/01/23 1000  azithromycin (ZITHROMAX) 500 mg in sodium chloride 0.9 % 250 mL IVPB        500 mg 250 mL/hr over 60 Minutes Intravenous Every 24 hours  11/30/23 1725 12/04/23 1126   12/01/23 1000  cefTRIAXone (ROCEPHIN) 1 g in sodium chloride 0.9 % 100 mL IVPB        1 g 200 mL/hr over 30 Minutes Intravenous Every 24 hours 11/30/23 1725 12/04/23 1008   11/30/23 1315  cefTRIAXone (ROCEPHIN) 1 g in sodium chloride 0.9 % 100 mL IVPB         1 g 200 mL/hr over 30 Minutes Intravenous  Once 11/30/23 1308 11/30/23 1500   11/30/23 1315  doxycycline (VIBRA-TABS) tablet 100 mg        100 mg Oral  Once 11/30/23 1308 11/30/23 1346       Subjective: Seen and examined at bedside and is very tearful about his diagnosis.  No nausea or vomiting.  Denies any more hematochezia or hematuria.  Feels okay.  No other concerns or complaints at this time.  Objective: Vitals:   12/09/23 2126 12/10/23 0455 12/10/23 1502 12/10/23 1924  BP: 133/82 133/81 104/68 131/74  Pulse: 85 83 76 77  Resp:  17  15  Temp:  98.1 F (36.7 C) 98.2 F (36.8 C) 97.8 F (36.6 C)  TempSrc:      SpO2:  94% 95% 96%  Weight:      Height:        Intake/Output Summary (Last 24 hours) at 12/10/2023 2010 Last data filed at 12/10/2023 0800 Gross per 24 hour  Intake --  Output 100 ml  Net -100 ml   Filed Weights   12/07/23 0500 12/08/23 0409 12/09/23 0500  Weight: 70.6 kg 70.1 kg 67.6 kg   Examination: Physical Exam:  Constitutional: Thin chronically ill-appearing very tearful male in no acute distress Respiratory: Diminished to auscultation bilaterally with some coarse breath sounds and has some slight rhonchi and crackles but no appreciable wheezing or rales. Normal respiratory effort and patient is not tachypenic. No accessory muscle use.  Wearing supplemental oxygen via nasal cannula Cardiovascular: RRR, no murmurs / rubs / gallops. S1 and S2 auscultated. No extremity edema. Abdomen: Soft, non-tender, non-distended.  Bowel sounds positive.  GU: Deferred. Musculoskeletal: No clubbing / cyanosis of digits/nails. No joint deformity upper and lower extremities. Skin: No rashes, lesions, ulcers on limited skin evaluation. No induration; Warm and dry.  Neurologic: CN 2-12 grossly intact with no focal deficits. Romberg sign and cerebellar reflexes not assessed.  Psychiatric: Normal judgment and insight. Alert and oriented x 3. Normal mood and appropriate  affect.   Data Reviewed: I have personally reviewed following labs and imaging studies  CBC: Recent Labs  Lab 12/04/23 0545 12/05/23 0649 12/05/23 0924 12/06/23 0559 12/07/23 0519 12/09/23 0541 12/10/23 0602  WBC 9.3 10.6*  --  9.0 8.5 9.9 8.7  NEUTROABS 6.6 7.7  --  6.6 5.8  --   --   HGB 9.6* 11.1* 10.3* 10.2* 10.3* 9.2* 8.7*  HCT 28.4* 32.9* 30.2* 30.2* 31.6* 27.9* 26.2*  MCV 92.2 93.5  --  91.5 94.0 94.3 94.9  PLT 200 241  --  224 218 178 187   Basic Metabolic Panel: Recent Labs  Lab 12/04/23 0545 12/05/23 0649 12/06/23 0559 12/07/23 0519 12/09/23 0541 12/10/23 0602  NA 135 135 134* 136 134* 134*  K 3.6 4.4 3.8 4.1 4.7 4.9  CL 99 100 97* 100 99 98  CO2 30 31 30 30 28 28   GLUCOSE 123* 122* 146* 123* 147* 133*  BUN 27* 26* 25* 25* 37* 46*  CREATININE 1.10 0.93 0.85 0.85 1.05 1.34*  CALCIUM 8.2* 8.6*  8.8* 8.9 8.8* 8.7*  MG 1.9 2.0 1.9 2.0 2.0 2.2  PHOS 2.7 2.6 3.0 2.7 2.9  --    GFR: Estimated Creatinine Clearance: 39.9 mL/min (A) (by C-G formula based on SCr of 1.34 mg/dL (H)). Liver Function Tests: Recent Labs  Lab 12/04/23 0545 12/05/23 0649 12/06/23 0559 12/07/23 0519  AST 21 24 22 23   ALT 14 15 14 13   ALKPHOS 65 74 75 73  BILITOT 0.6 0.7 0.9 0.8  PROT 4.6* 5.2* 5.1* 5.1*  ALBUMIN 2.1* 2.3* 2.2* 2.3*   No results for input(s): "LIPASE", "AMYLASE" in the last 168 hours. No results for input(s): "AMMONIA" in the last 168 hours. Coagulation Profile: Recent Labs  Lab 12/08/23 0547  INR 1.3*   Cardiac Enzymes: No results for input(s): "CKTOTAL", "CKMB", "CKMBINDEX", "TROPONINI" in the last 168 hours. BNP (last 3 results) No results for input(s): "PROBNP" in the last 8760 hours. HbA1C: No results for input(s): "HGBA1C" in the last 72 hours. CBG: Recent Labs  Lab 12/09/23 1717 12/09/23 2122 12/10/23 0756 12/10/23 1126 12/10/23 1727  GLUCAP 168* 108* 121* 167* 144*   Lipid Profile: No results for input(s): "CHOL", "HDL", "LDLCALC",  "TRIG", "CHOLHDL", "LDLDIRECT" in the last 72 hours. Thyroid Function Tests: No results for input(s): "TSH", "T4TOTAL", "FREET4", "T3FREE", "THYROIDAB" in the last 72 hours. Anemia Panel: No results for input(s): "VITAMINB12", "FOLATE", "FERRITIN", "TIBC", "IRON", "RETICCTPCT" in the last 72 hours. Sepsis Labs: No results for input(s): "PROCALCITON", "LATICACIDVEN" in the last 168 hours.  No results found for this or any previous visit (from the past 240 hours).   Radiology Studies: VAS [place] LOWER EXTREMITY VENOUS (DVT) Result Date: 12/09/2023  Lower Venous DVT Study Patient Name:  Nathan Fernandez  Date of Exam:   12/09/2023 Medical Rec #: [id number]      Accession #:    [id number] Date of Birth: May 16, 1941      Patient Gender: M Patient Age:   13 years Exam Location:  [facility] Procedure:      VAS [place] LOWER EXTREMITY VENOUS (DVT) Referring Phys: Stephania Fragmin --------------------------------------------------------------------------------  Indications: Pulmonary embolism.  Risk Factors: DVT HX of LLE DVT. Anticoagulation: Xarelto prior and during hospital stay. Now on lovenox due to PE on Xarelto. Comparison Study: Previous exam on 05/09/2022 was negative for DVT Performing Technologist: Ernestene Mention RVT, RDMS  Examination Guidelines: A complete evaluation includes B-mode imaging, spectral Doppler, color Doppler, and power Doppler as needed of all accessible portions of each vessel. Bilateral testing is considered an integral part of a complete examination. Limited examinations for reoccurring indications may be performed as noted. The reflux portion of the exam is performed with the patient in reverse Trendelenburg.  +---------+---------------+---------+-----------+----------+--------------+ RIGHT    CompressibilityPhasicitySpontaneityPropertiesThrombus Aging +---------+---------------+---------+-----------+----------+--------------+ CFV      Full           Yes      Yes                                  +---------+---------------+---------+-----------+----------+--------------+ SFJ      Full                                                        +---------+---------------+---------+-----------+----------+--------------+ FV Prox  Full  Yes      Yes                                 +---------+---------------+---------+-----------+----------+--------------+ FV Mid   Full           Yes      Yes                                 +---------+---------------+---------+-----------+----------+--------------+ FV DistalFull           Yes      Yes                                 +---------+---------------+---------+-----------+----------+--------------+ PFV      Full                                                        +---------+---------------+---------+-----------+----------+--------------+ POP      Partial        No       Yes                  Acute          +---------+---------------+---------+-----------+----------+--------------+ PTV      Partial        No       No                   Acute          +---------+---------------+---------+-----------+----------+--------------+ PERO     None           No       No                   Acute          +---------+---------------+---------+-----------+----------+--------------+   +---------+---------------+---------+-----------+----------+--------------+ LEFT     CompressibilityPhasicitySpontaneityPropertiesThrombus Aging +---------+---------------+---------+-----------+----------+--------------+ CFV      Full           Yes      Yes                                 +---------+---------------+---------+-----------+----------+--------------+ SFJ      Full                                                        +---------+---------------+---------+-----------+----------+--------------+ FV Prox  Full           Yes      Yes                                  +---------+---------------+---------+-----------+----------+--------------+ FV Mid   Full           Yes      Yes                                 +---------+---------------+---------+-----------+----------+--------------+  FV DistalFull           Yes      Yes                                 +---------+---------------+---------+-----------+----------+--------------+ PFV      Full                                                        +---------+---------------+---------+-----------+----------+--------------+ POP      Full           Yes      Yes                                 +---------+---------------+---------+-----------+----------+--------------+ PTV      Full                                                        +---------+---------------+---------+-----------+----------+--------------+ Soleal   Full                                                        +---------+---------------+---------+-----------+----------+--------------+     Summary: RIGHT: - Findings consistent with acute deep vein thrombosis involving the right peroneal veins, and right popliteal vein.   LEFT: - There is no evidence of deep vein thrombosis in the lower extremity.  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 Inflow velocity 11 cm/s. Outflow 13 cm/s. Collateral seen proximal to aneurysm. Seen on previous ultrasound from 05/09/2022. Distal ATA, PTA, and PeroA are all patent at the level of the ankle.  *See table(s) above for measurements and observations. Electronically signed by Heath Lark on 12/09/2023 at 4:06:36 PM.    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 Chest staging. * Tracking Code: BO * EXAM: CT CHEST WITH CONTRAST TECHNIQUE: Multidetector CT imaging of the chest was performed during  intravenous contrast administration. RADIATION DOSE REDUCTION: This exam was performed according to the departmental dose-optimization program which includes automated exposure control, adjustment of the mA and/or kV according to patient size and/or use of iterative reconstruction technique. CONTRAST:  75mL OMNIPAQUE IOHEXOL 300 MG/ML  SOLN COMPARISON:  12/06/2023 CT abdomen/pelvis.  11/30/2023 chest CT. FINDINGS: Cardiovascular: Normal heart size. No significant pericardial effusion/thickening. Three-vessel coronary atherosclerosis. Atherosclerotic nonaneurysmal thoracic aorta. Normal caliber pulmonary arteries. Acute lobar and segmental right middle lobe (series 2/image 91) and segmental and subsegmental multifocal right lower lobe (series 2/images 96 and 109) pulmonary emboli. No saddle emboli. Mediastinum/Nodes: No significant thyroid nodules. Unremarkable esophagus. No axillary adenopathy. Bilateral paratracheal adenopathy up to 2.3 cm on the right (series 2/image 64), unchanged from recent 11/30/2023 chest CT. Stable mildly enlarged 1.2 cm subcarinal node (series 2/image 76). Stable  mildly enlarged bilateral retrocrural nodes, for example 1.3 cm on the right (series 2/image 158), unchanged. Mildly enlarged 1.1 cm right hilar node (series 2/image 88), unchanged. No discrete left hilar adenopathy. Lungs/Pleura: No pneumothorax. Trace posterior right pleural effusion is unchanged. No left pleural effusion. Severe centrilobular emphysema with diffuse bronchial wall thickening. Patchy consolidation in the posterior right lower lobe is similar. Numerous (greater than 10) indistinct solid pulmonary nodules scattered throughout both lungs, not substantially changed from recent chest CT, for example 0.9 cm in the posterior left lower lobe (series 8/image 99) and 0.8 cm in the anterior left upper lobe (series 8/image 68), new from more remote 11/26/2020 screening chest CT. Upper abdomen: Small volume upper abdominal  ascites, unchanged from recent CT abdomen study. Several indistinct hypodense liver masses scattered throughout the visualized liver up to 2.5 cm in the lateral segment left liver on series 2/image 149, not substantially changed from recent CT abdomen study. Limited visualization of known pancreatic tail mass. Musculoskeletal: Small sclerotic lesions in the manubrium (series 7/image 90) and posterior T6 vertebral body (series 7/image 91), new from 11/26/2020 CT. Mild superior T12 vertebral compression fracture, unchanged from 11/30/2023 chest CT. Marked thoracic spondylosis. IMPRESSION: 1. Acute lobar and segmental right middle lobe and segmental and subsegmental multifocal right lower lobe pulmonary emboli. No saddle emboli. 2. Numerous (greater than 10) indistinct solid pulmonary nodules scattered throughout both lungs, largest 0.9 cm in the left lower lobe, not substantially changed from recent 11/30/2023 chest CT, new from more remote 11/26/2020 screening chest CT, compatible with pulmonary metastases. 3. Stable mild mediastinal, bilateral retrocrural and right hilar adenopathy since 11/30/2023 chest CT, likely nodal metastases. 4. Small sclerotic lesions in the manubrium and posterior T6 vertebral body, new from 11/26/2020 CT, suspect osseous metastases. 5. Trace posterior right pleural effusion, unchanged. Patchy consolidation in the posterior right lower lobe is similar, which could represent atelectasis or mild pneumonia. 6. Please see the recent CT abdomen/pelvis report from 12/06/2023 for details regarding the upper abdominal findings. 7. Three-vessel coronary atherosclerosis. 8. Aortic Atherosclerosis (ICD10-I70.0) and Emphysema (ICD10-J43.9). Critical Value/emergent results were called by telephone at the time of interpretation on 12/09/2023 at 8:53 am to provider [facility] , who verbally acknowledged these results. Electronically Signed   By: Delbert Phenix M.D.   On: 12/09/2023 08:57   Scheduled  Meds:  B-complex with vitamin C  1 tablet Oral Daily   buPROPion  150 mg Oral Daily   carvedilol  6.25 mg Oral BID   enoxaparin (LOVENOX) injection  65 mg Subcutaneous BID   feeding supplement  237 mL Oral BID BM   fenofibrate  54 mg Oral Daily   gabapentin  300 mg Oral TID   hydrALAZINE  10 mg Oral Q12H   hydrocortisone  25 mg Rectal BID   insulin aspart  0-15 Units Subcutaneous TID WC   insulin aspart  0-5 Units Subcutaneous QHS   isosorbide mononitrate  15 mg Oral Daily   polyethylene glycol  17 g Oral BID   rosuvastatin  20 mg Oral Daily   spironolactone  12.5 mg Oral Daily   Continuous Infusions:   LOS: 9 days   Marguerita Merles, DO [facility] Available via Epic secure chat 7am-7pm After these hours, please refer to coverage provider listed on [email or web address] 12/10/2023, 8:10 PM

## 2023-12-11 ENCOUNTER — Inpatient Hospital Stay (HOSPITAL_COMMUNITY): Payer: Medicare Other

## 2023-12-11 DIAGNOSIS — Z87891 Personal history of nicotine dependence: Secondary | ICD-10-CM | POA: Diagnosis not present

## 2023-12-11 DIAGNOSIS — I2510 Atherosclerotic heart disease of native coronary artery without angina pectoris: Secondary | ICD-10-CM | POA: Diagnosis not present

## 2023-12-11 DIAGNOSIS — K640 First degree hemorrhoids: Secondary | ICD-10-CM | POA: Diagnosis not present

## 2023-12-11 DIAGNOSIS — K649 Unspecified hemorrhoids: Secondary | ICD-10-CM | POA: Diagnosis not present

## 2023-12-11 DIAGNOSIS — Z7401 Bed confinement status: Secondary | ICD-10-CM | POA: Diagnosis not present

## 2023-12-11 DIAGNOSIS — R319 Hematuria, unspecified: Secondary | ICD-10-CM | POA: Diagnosis not present

## 2023-12-11 DIAGNOSIS — C7802 Secondary malignant neoplasm of left lung: Secondary | ICD-10-CM | POA: Diagnosis not present

## 2023-12-11 DIAGNOSIS — M792 Neuralgia and neuritis, unspecified: Secondary | ICD-10-CM | POA: Diagnosis not present

## 2023-12-11 DIAGNOSIS — E119 Type 2 diabetes mellitus without complications: Secondary | ICD-10-CM | POA: Diagnosis not present

## 2023-12-11 DIAGNOSIS — M48062 Spinal stenosis, lumbar region with neurogenic claudication: Secondary | ICD-10-CM | POA: Diagnosis not present

## 2023-12-11 DIAGNOSIS — R112 Nausea with vomiting, unspecified: Secondary | ICD-10-CM | POA: Diagnosis not present

## 2023-12-11 DIAGNOSIS — L24A2 Irritant contact dermatitis due to fecal, urinary or dual incontinence: Secondary | ICD-10-CM | POA: Diagnosis not present

## 2023-12-11 DIAGNOSIS — C7801 Secondary malignant neoplasm of right lung: Secondary | ICD-10-CM | POA: Diagnosis not present

## 2023-12-11 DIAGNOSIS — K625 Hemorrhage of anus and rectum: Secondary | ICD-10-CM | POA: Diagnosis not present

## 2023-12-11 DIAGNOSIS — I252 Old myocardial infarction: Secondary | ICD-10-CM | POA: Diagnosis not present

## 2023-12-11 DIAGNOSIS — E1140 Type 2 diabetes mellitus with diabetic neuropathy, unspecified: Secondary | ICD-10-CM | POA: Diagnosis not present

## 2023-12-11 DIAGNOSIS — C259 Malignant neoplasm of pancreas, unspecified: Secondary | ICD-10-CM | POA: Diagnosis not present

## 2023-12-11 DIAGNOSIS — C787 Secondary malignant neoplasm of liver and intrahepatic bile duct: Secondary | ICD-10-CM | POA: Diagnosis not present

## 2023-12-11 DIAGNOSIS — R079 Chest pain, unspecified: Secondary | ICD-10-CM | POA: Diagnosis not present

## 2023-12-11 DIAGNOSIS — I5033 Acute on chronic diastolic (congestive) heart failure: Secondary | ICD-10-CM | POA: Diagnosis not present

## 2023-12-11 DIAGNOSIS — I639 Cerebral infarction, unspecified: Secondary | ICD-10-CM | POA: Diagnosis not present

## 2023-12-11 DIAGNOSIS — Z86718 Personal history of other venous thrombosis and embolism: Secondary | ICD-10-CM | POA: Diagnosis not present

## 2023-12-11 DIAGNOSIS — R0602 Shortness of breath: Secondary | ICD-10-CM | POA: Diagnosis not present

## 2023-12-11 DIAGNOSIS — K5900 Constipation, unspecified: Secondary | ICD-10-CM | POA: Diagnosis not present

## 2023-12-11 DIAGNOSIS — I723 Aneurysm of iliac artery: Secondary | ICD-10-CM | POA: Diagnosis not present

## 2023-12-11 DIAGNOSIS — I82409 Acute embolism and thrombosis of unspecified deep veins of unspecified lower extremity: Secondary | ICD-10-CM | POA: Diagnosis not present

## 2023-12-11 DIAGNOSIS — I2609 Other pulmonary embolism with acute cor pulmonale: Secondary | ICD-10-CM | POA: Diagnosis not present

## 2023-12-11 DIAGNOSIS — R609 Edema, unspecified: Secondary | ICD-10-CM | POA: Diagnosis not present

## 2023-12-11 DIAGNOSIS — R0789 Other chest pain: Secondary | ICD-10-CM | POA: Diagnosis not present

## 2023-12-11 DIAGNOSIS — I10 Essential (primary) hypertension: Secondary | ICD-10-CM | POA: Diagnosis not present

## 2023-12-11 DIAGNOSIS — Z7901 Long term (current) use of anticoagulants: Secondary | ICD-10-CM | POA: Diagnosis not present

## 2023-12-11 DIAGNOSIS — M25512 Pain in left shoulder: Secondary | ICD-10-CM | POA: Diagnosis not present

## 2023-12-11 DIAGNOSIS — J449 Chronic obstructive pulmonary disease, unspecified: Secondary | ICD-10-CM | POA: Diagnosis not present

## 2023-12-11 DIAGNOSIS — J9601 Acute respiratory failure with hypoxia: Secondary | ICD-10-CM | POA: Diagnosis not present

## 2023-12-11 DIAGNOSIS — J189 Pneumonia, unspecified organism: Secondary | ICD-10-CM | POA: Diagnosis not present

## 2023-12-11 DIAGNOSIS — R58 Hemorrhage, not elsewhere classified: Secondary | ICD-10-CM | POA: Diagnosis not present

## 2023-12-11 DIAGNOSIS — G629 Polyneuropathy, unspecified: Secondary | ICD-10-CM | POA: Diagnosis not present

## 2023-12-11 DIAGNOSIS — Z743 Need for continuous supervision: Secondary | ICD-10-CM | POA: Diagnosis not present

## 2023-12-11 DIAGNOSIS — E8809 Other disorders of plasma-protein metabolism, not elsewhere classified: Secondary | ICD-10-CM | POA: Diagnosis not present

## 2023-12-11 DIAGNOSIS — R978 Other abnormal tumor markers: Secondary | ICD-10-CM | POA: Diagnosis not present

## 2023-12-11 DIAGNOSIS — J440 Chronic obstructive pulmonary disease with acute lower respiratory infection: Secondary | ICD-10-CM | POA: Diagnosis not present

## 2023-12-11 DIAGNOSIS — C786 Secondary malignant neoplasm of retroperitoneum and peritoneum: Secondary | ICD-10-CM | POA: Diagnosis not present

## 2023-12-11 DIAGNOSIS — R310 Gross hematuria: Secondary | ICD-10-CM | POA: Diagnosis not present

## 2023-12-11 LAB — CBC WITH DIFFERENTIAL/PLATELET
Abs Immature Granulocytes: 0.07 10*3/uL (ref 0.00–0.07)
Basophils Absolute: 0.1 10*3/uL (ref 0.0–0.1)
Basophils Relative: 1 %
Eosinophils Absolute: 0.5 10*3/uL (ref 0.0–0.5)
Eosinophils Relative: 6 %
HCT: 28.7 % — ABNORMAL LOW (ref 39.0–52.0)
Hemoglobin: 9.1 g/dL — ABNORMAL LOW (ref 13.0–17.0)
Immature Granulocytes: 1 %
Lymphocytes Relative: 13 %
Lymphs Abs: 1.3 10*3/uL (ref 0.7–4.0)
MCH: 31.2 pg (ref 26.0–34.0)
MCHC: 31.7 g/dL (ref 30.0–36.0)
MCV: 98.3 fL (ref 80.0–100.0)
Monocytes Absolute: 1 10*3/uL (ref 0.1–1.0)
Monocytes Relative: 11 %
Neutro Abs: 6.6 10*3/uL (ref 1.7–7.7)
Neutrophils Relative %: 68 %
Platelets: 223 10*3/uL (ref 150–400)
RBC: 2.92 MIL/uL — ABNORMAL LOW (ref 4.22–5.81)
RDW: 14.6 % (ref 11.5–15.5)
WBC: 9.5 10*3/uL (ref 4.0–10.5)
nRBC: 0 % (ref 0.0–0.2)

## 2023-12-11 LAB — COMPREHENSIVE METABOLIC PANEL
ALT: 13 U/L (ref 0–44)
AST: 25 U/L (ref 15–41)
Albumin: 2.2 g/dL — ABNORMAL LOW (ref 3.5–5.0)
Alkaline Phosphatase: 70 U/L (ref 38–126)
Anion gap: 8 (ref 5–15)
BUN: 46 mg/dL — ABNORMAL HIGH (ref 8–23)
CO2: 25 mmol/L (ref 22–32)
Calcium: 8.5 mg/dL — ABNORMAL LOW (ref 8.9–10.3)
Chloride: 99 mmol/L (ref 98–111)
Creatinine, Ser: 1.34 mg/dL — ABNORMAL HIGH (ref 0.61–1.24)
GFR, Estimated: 53 mL/min — ABNORMAL LOW (ref >60)
Glucose, Bld: 125 mg/dL — ABNORMAL HIGH (ref 70–99)
Potassium: 4.6 mmol/L (ref 3.5–5.1)
Sodium: 132 mmol/L — ABNORMAL LOW (ref 135–145)
Total Bilirubin: 0.7 mg/dL (ref <1.2)
Total Protein: 5.1 g/dL — ABNORMAL LOW (ref 6.5–8.1)

## 2023-12-11 LAB — MAGNESIUM: Magnesium: 2.5 mg/dL — ABNORMAL HIGH (ref 1.7–2.4)

## 2023-12-11 LAB — GLUCOSE, CAPILLARY
Glucose-Capillary: 124 mg/dL — ABNORMAL HIGH (ref 70–99)
Glucose-Capillary: 126 mg/dL — ABNORMAL HIGH (ref 70–99)
Glucose-Capillary: 134 mg/dL — ABNORMAL HIGH (ref 70–99)

## 2023-12-11 LAB — PHOSPHORUS: Phosphorus: 3.4 mg/dL (ref 2.5–4.6)

## 2023-12-11 MED ORDER — B COMPLEX-C PO TABS: 1.0000 | ORAL_TABLET | Freq: Every day | ORAL | Status: DC

## 2023-12-11 MED ORDER — ONDANSETRON HCL 4 MG PO TABS: 4.0000 mg | ORAL_TABLET | Freq: Four times a day (QID) | ORAL | 0 refills | Status: DC | PRN

## 2023-12-11 MED ORDER — SPIRONOLACTONE 25 MG PO TABS: 12.5000 mg | ORAL_TABLET | Freq: Every day | ORAL | Status: DC

## 2023-12-11 MED ORDER — GABAPENTIN 300 MG PO CAPS: 300.0000 mg | ORAL_CAPSULE | Freq: Three times a day (TID) | ORAL | Status: DC

## 2023-12-11 MED ORDER — FUROSEMIDE 10 MG/ML IJ SOLN
40.0000 mg | Freq: Once | INTRAMUSCULAR | Status: AC
  Administered 2023-12-11: 40 mg via INTRAVENOUS
  Filled 2023-12-11: qty 4

## 2023-12-11 MED ORDER — HYDROCORTISONE 0.5 % EX CREA: TOPICAL_CREAM | Freq: Two times a day (BID) | CUTANEOUS | 0 refills | Status: DC | PRN

## 2023-12-11 MED ORDER — ENOXAPARIN SODIUM 80 MG/0.8ML IJ SOSY: 65.0000 mg | PREFILLED_SYRINGE | Freq: Two times a day (BID) | INTRAMUSCULAR | Status: DC

## 2023-12-11 MED ORDER — ENSURE ENLIVE PO LIQD: 237.0000 mL | Freq: Two times a day (BID) | ORAL | 12 refills | Status: DC

## 2023-12-11 MED ORDER — HYDROCORTISONE ACETATE 25 MG RE SUPP: 25.0000 mg | Freq: Two times a day (BID) | RECTAL | 0 refills | Status: DC

## 2023-12-11 MED ORDER — ACETAMINOPHEN 325 MG PO TABS: 650.0000 mg | ORAL_TABLET | Freq: Four times a day (QID) | ORAL | Status: DC | PRN

## 2023-12-11 MED ORDER — FUROSEMIDE 40 MG PO TABS: 40.0000 mg | ORAL_TABLET | Freq: Every day | ORAL | Status: DC

## 2023-12-11 NOTE — Plan of Care (Signed)
?  Problem: Education: ?Goal: Knowledge of General Education information will improve ?Description: Including pain rating scale, medication(s)/side effects and non-pharmacologic comfort measures ?Outcome: Progressing ?  ?Problem: Health Behavior/Discharge Planning: ?Goal: Ability to manage health-related needs will improve ?Outcome: Progressing ?  ?Problem: Clinical Measurements: ?Goal: Ability to maintain clinical measurements within normal limits will improve ?Outcome: Progressing ?Goal: Will remain free from infection ?Outcome: Progressing ?Goal: Diagnostic test results will improve ?Outcome: Progressing ?Goal: Cardiovascular complication will be avoided ?Outcome: Progressing ?  ?Problem: Nutrition: ?Goal: Adequate nutrition will be maintained ?Outcome: Progressing ?  ?

## 2023-12-11 NOTE — TOC Transition Note (Signed)
Transition of Care [facility]) - Discharge Note   Patient Details  Name: Nathan Fernandez MRN: [id number] Date of Birth: 1940/12/26  Transition of Care [facility]) CM/SW Contact:  Otelia Santee, LCSW Phone Number: 12/11/2023, 3:51 PM   Clinical Narrative:    Pt's insurance approved for SNF. Pt able to transfer to [facility] today. Pt will be going to room 203. RN to call report to [phone]. Spoke with pt's wife and confirmed discharge plans. Pt to be transported via [facility] as he continues to have O2 requirement. [facility] called at 3:40pm for transportation.    Final next level of care: Skilled Nursing Facility Barriers to Discharge: Barriers Resolved   Patient Goals and CMS Choice Patient states their goals for this hospitalization and ongoing recovery are:: For pt to go to SNF CMS Medicare.gov Compare Post Acute Care list provided to:: Patient Choice offered to / list presented to : Patient [place] ownership interest in [facility].provided to:: Patient    Discharge Placement   Existing PASRR number confirmed : 12/01/23          Patient chooses bed at: [facility], [place] Patient to be transferred to facility by: [facility] Name of family member notified: Spouse Patient and family notified of of transfer: 12/11/23  Discharge Plan and Services Additional resources added to the After Visit Summary for   In-house Referral: Clinical Social Work Discharge Planning Services: NA Post Acute Care Choice: Skilled Nursing Facility          DME Arranged: N/A DME Agency: NA                  Social Drivers of Health (SDOH) Interventions SDOH Screenings   Food Insecurity: No Food Insecurity (11/30/2023)  Housing: Patient Declined (11/30/2023)  Transportation Needs: No Transportation Needs (11/30/2023)  Utilities: Not At Risk (11/30/2023)  Alcohol Screen: Low Risk  (11/24/2023)  Depression (PHQ2-9): Low Risk  (11/24/2023)  Financial Resource Strain: Low Risk  (11/24/2023)  Physical  Activity: Inactive (11/24/2023)  Social Connections: Socially Integrated (11/24/2023)  Stress: Stress Concern Present (11/24/2023)  Tobacco Use: Medium Risk (11/30/2023)  Health Literacy: Adequate Health Literacy (11/24/2023)     Readmission Risk Interventions    12/11/2023    3:50 PM  Readmission Risk Prevention Plan  Transportation Screening Complete  PCP or Specialist Appt within 3-5 Days Complete  HRI or Home Care Consult Complete  Social Work Consult for Recovery Care Planning/Counseling Complete  Palliative Care Screening Not Applicable  Medication Review Oceanographer) Complete

## 2023-12-11 NOTE — Discharge Summary (Signed)
Physician Discharge Summary   Patient: Nathan Fernandez MRN: [id number] DOB: March 16, 1941  Admit date:     11/30/2023  Discharge date: 12/11/23  Discharge Physician: Marguerita Merles, DO   PCP: Joaquim Nam, MD   Recommendations at discharge:   Follow-up with PCP within 1 to 2 weeks and repeat CBC, CMP, mag, Phos within 1 week Follow-up with urology in outpatient setting for hematuria follow-up Follow-up with gastroenterology in outpatient setting for hemorrhoidal bleeding Follow-up with medical oncology Dr. Arlana Pouch 12/21/23 at the [facility] with [facility] health Follow-up with cardiology in outpatient setting and repeat chest x-ray 3 to 6 weeks and continue diuresis at discharge Follow-up with neurosurgery Dr. Wynetta Emery in outpatient setting appointment already been scheduled for January 6  Discharge Diagnoses: Principal Problem:   CAP (community acquired pneumonia) Active Problems:   Anticoagulated   Rectal bleeding   Hemorrhoids   Constipation  Resolved Problems:   * No resolved hospital problems. [facility] Course: The patient is 82 year old with history of CAD, COPD on room air, non-insulin-dependent DM 2, Hx of DVT on Xarelto admitted for community-acquired pneumonia and Acute on Chronic Diastolic CHF.  Initially also thought to be slightly volume overload requiring IV Lasix.  During hospitalization started having some rectal bleeding concerning for possible hemorrhoids, seen by GI recommended Anusol suppositories.  Patient also had hematuria, case was discussed with urology who recommended CT hematuria.  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  Eventually CT chest showed concerns of multiple lung lesion, bone lesion and right-sided pulmonary embolism despite of being on Xarelto.  Xarelto was transitioned to Lovenox.  Outpatient appointment with medical oncology has been arranged.  Currently PT OT recommending SNF he continued to  be a little volume overloaded so was given another dose of IV Lasix today.  Will transition over to oral Lasix at discharge him going to follow-up with PCP, Urology, Cardiology, Medical Oncology and Gastroenterology in outpatient setting.  Assessment & Plan:  Principal Problem:   CAP (community acquired pneumonia) Active Problems:   Anticoagulated   Rectal bleeding   Hemorrhoids   Constipation   Sepsis and Acute Hypoxic Respiratory Failure due to CAP and Acute on Chronic Diastolic CHF, as well as acute PE -Initially met sepsis criteria which is improving.  Patient has now completed 5 days of Rocephin and azithromycin.  Continue bronchodilators as needed, I-S/flutter valve. -PCT was less than 0.10 -SpO2: 94 % O2 Flow Rate (L/min): 3 L/min -Give another dose of IV Lasix today -Continue supplemental oxygen via nasal cannula and wean O2 as tolerated -Continuous pulse oximetry and maintain O2 saturations greater than 90% -Patient desaturated on his ambulatory home O2 screen previously and will need oxygen at discharge -Repeat chest x-ray done and showed "Increased interstitial densities are noted bilaterally as described above."  Acute on Chronic HFrEF, improved CAD HLD -Currently chest pain-free.  Previous echocardiogram has shown EF of 40%.  -During hospitalization there was concerns of slight volume overload requiring IV diuretics.   Intake/Output Summary (Last 24 hours) at 12/11/2023 1516 Last data filed at 12/11/2023 0900 Gross per 24 hour  Intake 240 ml  Output 800 ml  Net -560 ml  -He appears slightly volume overloaded again today so we will give another dose of IV Lasix and will transition to oral Lasix 40 mg p.o. daily continue Coreg, hydralazine, Aldactone, Imdur -Repeat chest x-ray in 3 to 6 weeks  AKI -Mild. BUN/Cr Trend: Recent Labs  Lab 12/04/23  5409 12/05/23 0649 12/06/23 0559 12/07/23 0519 12/09/23 0541 12/10/23 0602 12/11/23 0522  BUN 27* 26* 25* 25* 37* 46*  46*  CREATININE 1.10 0.93 0.85 0.85 1.05 1.34* 1.34*  -Was diuresed with diuresis had been stopped will resume given a dose of IV Lasix and continue at discharge.  If necessary will hold spironolactone but continue to monitor for now -Avoid Nephrotoxic Medications, Contrast Dyes, Hypotension and Dehydration to Ensure Adequate Renal Perfusion and will need to Renally Adjust Meds -Continue to Monitor and Trend Renal Function carefully and repeat CMP in within 1 week  Pancreatic Adenocarcinoma with Metastasis.  -This was seen on incidental finding on CT hematuria.  Has pancreatic tail mass, peritoneal mass and liver lesions.   -Status post CT-guided peritoneal biopsy on 12/17 which showed "Moderately differentiated adenocarcinoma morphologically consistent  with a pancreatic primary." -Dr. Nelson Chimes Discussed with outpatient oncology as well, they have made appointment for his follow-up.   -CT chest showed multiple lung lesions and bone lesions. -Patient has a outpatient Oncology Appt with Dr Arlana Pouch on Dec 30th 230pm at [facility].   Right-sided pulmonary embolism, without cor pulmonale -Developed this despite of being on Xarelto.  Given concerns of underlying malignancy, will start Lovenox.  LE duplex as below -Should be on Lovenox indefinitely if it is not cost prohibitive  Type 2 Diabetes Mellitus  -Continue sliding scale and Accu-Chek.  Adjust as necessary -CBG Trend: Recent Labs  Lab 12/09/23 2122 12/10/23 0756 12/10/23 1126 12/10/23 1727 12/10/23 2032 12/11/23 0731 12/11/23 1205  GLUCAP 108* 121* 167* 144* 157* 124* 126*   Hypertension -Continue home Spironolactone 12.5 mg po Daily, Isosorbide Mononitrate 15 mg po Daily and Carvedilol 6.25 mg po BID  -Continue monitor blood pressures per protocol -Last blood pressure reading was 95/68   Neuropathy Continue as needed Flexeril.  3 times daily gabapentin. -Recent outpatient MRI has shown multilevel cervical  spondylosis with canal narrowing.  Has outpatient follow-up appointment with neurosurgery on January 6.     Generalized Weakness -Presumably due to his community-acquired pneumonia, continue PT/OT recommending SNF SNF   Normocytic Anemia with now lower GI bleeding in the setting of Hemorrhoids and Hematuria -Hgb/Hct Trend: Recent Labs  Lab 12/05/23 0649 12/05/23 0924 12/06/23 0559 12/07/23 0519 12/09/23 0541 12/10/23 0602 12/11/23 0522  HGB 11.1* 10.3* 10.2* 10.3* 9.2* 8.7* 9.1*  HCT 32.9* 30.2* 30.2* 31.6* 27.9* 26.2* 28.7*  MCV 93.5  --  91.5 94.0 94.3 94.9 98.3  -Checked Anemia Panel and showed an iron level of 36, UIBC of 135, TIBC 171, saturation ratios of 21%, ferritin level 761, folate levels 12.5 and vitamin B12 873 -Changed anticoagulation to subcu Lovenox -Continue to Monitor for S/Sx of Bleeding; previously had hematuria and hematochezia which have resolved -Repeat CBC within 1 week  Hx of DVT and now acute DVT -Was on anticoagulation with rivaroxaban 20 mg p.o. daily but was held given his FOBT and his hematuria but now has been changed to subcu Lovenox -Repeat LE duplex done and showed "RIGHT: Findings consistent with acute deep vein thrombosis involving the right peroneal veins, and right popliteal vein. LEFT: There is no evidence of deep vein thrombosis in the lower extremity. 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 Inflow velocity 11 cm/s. Outflow 13 cm/s. Collateral seen  proximal to aneurysm. Seen on previous ultrasound from 05/09/2022. Distal  ATA, PTA, and PeroA are all patent at the level of the ankle." -Continue with  anticoagulation as above   LGIB/Hematochezia, resolved -In the setting of Hemorrhoids -C/w Hydrocortisone Suppository 25 mg RC BID -GI consulted and recommending good bowel regimen starting MiraLAX daily to twice daily and titrating up as needed and continue Anusol suppositories for week   Hematuria,  resolved -Hematuria noted.  No evidence of UTI.  I had spoken with Dr. Annabell Howells who recommended a CT hematuria study -Hematuria has resolved and will need outpatient follow-up with urology  Generalized weakness and deconditioning -In the setting of metastatic cancer community-acquired pneumonia along with his other comorbidities -PT OT recommending SNF   Hypoalbuminemia -Patient's Albumin Trend: Recent Labs  Lab 12/02/23 0513 12/03/23 0540 12/04/23 0545 12/05/23 0649 12/06/23 0559 12/07/23 0519 12/11/23 0522  ALBUMIN 2.2* 2.2* 2.1* 2.3* 2.2* 2.3* 2.2*  -Continue to Monitor and Trend and repeat CMP within 1 week  Nutrition Documentation    Flowsheet Row ED to Hosp-Admission (Current) from [facility]/2023 in [facility]  Nutrition Problem Increased nutrient needs  Etiology acute illness  Nutrition Goal Patient will meet greater than or equal to 90% of their needs  Interventions Ensure Enlive (each supplement provides 350kcal and 20 grams of protein), MVI      Active Pressure Injury/Wound(s)     Pressure Ulcer  Duration          Pressure Injury 11/30/23 Sacrum Bilateral Stage 2 -  Partial thickness loss of dermis presenting as a shallow open injury with a red, pink wound bed without slough. 10 days        Consultants: Gastroenterology, urology Procedures performed: As delineated as above Disposition: Skilled nursing facility Diet recommendation:  Cardiac diet DISCHARGE MEDICATION: Allergies as of 12/11/2023       Reactions   Ace Inhibitors Swelling   Swelling of the tongue   Angiotensin Receptor Blockers Swelling   Tongue swelling   Vasotec [enalapril] Swelling   Tongue swelling   Pravachol [pravastatin] Other (See Comments)   Myalgias         Medication List     STOP taking these medications    oxyCODONE-acetaminophen 5-325 MG tablet Commonly known as: PERCOCET/ROXICET   Xarelto 20 MG Tabs tablet Generic drug:  rivaroxaban       TAKE these medications    acetaminophen 325 MG tablet Commonly known as: TYLENOL Take 2 tablets (650 mg total) by mouth every 6 (six) hours as needed for mild pain (pain score 1-3) (or Fever >/= 101).   albuterol 108 (90 Base) MCG/ACT inhaler Commonly known as: VENTOLIN HFA Inhale 1-2 puffs into the lungs every 6 (six) hours as needed (COPD).   aspirin EC 81 MG tablet Take 81 mg by mouth daily. Swallow whole.   B Complex Caps Take 1 capsule by mouth daily.   B-12 PO Take 1 tablet by mouth daily.   B-complex with vitamin C tablet Take 1 tablet by mouth daily. Start taking on: December 12, 2023   buPROPion 150 MG 12 hr tablet Commonly known as: WELLBUTRIN SR Take 1 tablet (150 mg total) by mouth daily.   carvedilol 6.25 MG tablet Commonly known as: COREG TAKE 1 TABLET BY MOUTH TWICE A DAY   cyclobenzaprine 5 MG tablet Commonly known as: FLEXERIL Take 1 tablet (5 mg total) by mouth 3 (three) times daily as needed for muscle spasms.   dorzolamide-timolol 2-0.5 % ophthalmic solution Commonly known as: COSOPT Place 1 drop into both eyes 2 (two) times daily.   ELDERBERRY PO Take 1 capsule  by mouth in the morning and at bedtime.   enoxaparin 80 MG/0.8ML injection Commonly known as: LOVENOX Inject 0.65 mLs (65 mg total) into the skin 2 (two) times daily.   feeding supplement Liqd Take 237 mLs by mouth 2 (two) times daily between meals.   fenofibrate 48 MG tablet Commonly known as: TRICOR TAKE 1 TABLET BY MOUTH EVERY DAY   furosemide 40 MG tablet Commonly known as: Lasix Take 1 tablet (40 mg total) by mouth daily.   gabapentin 300 MG capsule Commonly known as: NEURONTIN Take 1 capsule (300 mg total) by mouth 3 (three) times daily. What changed: Another medication with the same name was removed. Continue taking this medication, and follow the directions you see here.   GLUCOSAMINE CHONDR COMPLEX PO Take 1 tablet by mouth daily.    hydrALAZINE 10 MG tablet Commonly known as: APRESOLINE TAKE 1 TABLET BY MOUTH EVERY 12 HOURS.   hydrocortisone 25 MG suppository Commonly known as: ANUSOL-HC Place 1 suppository (25 mg total) rectally 2 (two) times daily.   hydrocortisone cream 0.5 % Apply topically 2 (two) times daily as needed for itching.   isosorbide mononitrate 30 MG 24 hr tablet Commonly known as: IMDUR TAKE 1/2 TABLET BY MOUTH DAILY   ketoconazole 2 % cream Commonly known as: NIZORAL APPLY 1 APPLICATION TOPICALLY DAILY AS NEEDED FOR IRRITATION.   lidocaine 5 % ointment Commonly known as: XYLOCAINE Apply 1 Application topically as needed.   nitroGLYCERIN 0.4 MG SL tablet Commonly known as: NITROSTAT Place 1 tablet (0.4 mg total) under the tongue every 5 (five) minutes as needed for chest pain. Up to 3 doses   ondansetron 4 MG tablet Commonly known as: ZOFRAN Take 1 tablet (4 mg total) by mouth every 6 (six) hours as needed for nausea.   polyethylene glycol powder 17 GM/SCOOP powder Commonly known as: GLYCOLAX/MIRALAX Take 17 g by mouth daily as needed.   rosuvastatin 20 MG tablet Commonly known as: CRESTOR TAKE 1 TABLET BY MOUTH EVERY DAY   spironolactone 25 MG tablet Commonly known as: ALDACTONE Take 0.5 tablets (12.5 mg total) by mouth daily. Start taking on: December 12, 2023   traZODone 100 MG tablet Commonly known as: DESYREL Take 1-2 tablets (100-200 mg total) by mouth at bedtime. What changed: how much to take   VITAMIN C PO Take 1 tablet by mouth daily.   VITAMIN D PO Take 1 tablet by mouth daily.   VITAMIN D-3 PO Take 1 capsule by mouth daily.               Discharge Care Instructions  (From admission, onward)           Start     Ordered   12/11/23 0000  Discharge wound care:       Comments: Frequent Turning and off loading   12/11/23 1524            Contact information for after-discharge care     Destination     [facility]  Preferred SNF .   Service: Skilled Nursing Contact information: [address] [place] [address] [phone]                    Discharge Exam: Filed Weights   12/07/23 0500 12/08/23 0409 12/09/23 0500  Weight: 70.6 kg 70.1 kg 67.6 kg   Vitals:   12/11/23 0531 12/11/23 1348  BP: (!) 153/94 95/68  Pulse: 88 71  Resp: 15 18  Temp: 98.5 F (36.9 C) 97.9 F (36.6 C)  SpO2: 97% 94%   Examination: Physical Exam:  Constitutional: WN/WD chronically ill-appearing Caucasian male in no acute distress Respiratory: Diminished to auscultation bilaterally with some coarse breath sounds and some crackles and some slight rhonchi but no appreciable wheezing or rales.  Has a normal respiratory effort and is wearing supplemental oxygen via nasal cannula, Cardiovascular: RRR, no murmurs / rubs / gallops. S1 and S2 auscultated. No appreciable lower extremity edema.  Abdomen: Soft, non-tender, non-distended. Bowel sounds positive.  GU: Deferred. Musculoskeletal: No clubbing / cyanosis of digits/nails. No joint deformity upper and lower extremities.  Skin: No rashes, lesions, ulcers on limited skin evaluation. No induration; Warm and dry.  Neurologic: CN 2-12 grossly intact with no focal deficits. Romberg sign cerebellar reflexes not assessed.  Psychiatric: Normal judgment and insight. Alert and oriented x 3.  Slightly tearful mood and appropriate affect.   Condition at discharge: stable  The results of significant diagnostics from this hospitalization (including imaging, microbiology, ancillary and laboratory) are listed below for reference.   Imaging Studies: DG CHEST PORT 1 VIEW Result Date: 12/11/2023 CLINICAL DATA:  Shortness of breath. EXAM: PORTABLE CHEST 1 VIEW COMPARISON:  December 05, 2023. FINDINGS: The heart size and mediastinal contours are within normal limits. Increased interstitial densities are noted throughout both lungs concerning for edema or  atypical infection. The visualized skeletal structures are unremarkable. IMPRESSION: Increased interstitial densities are noted bilaterally as described above. Electronically Signed   By: Lupita Raider M.D.   On: 12/11/2023 08:36   VAS [place] LOWER EXTREMITY VENOUS (DVT) Result Date: 12/09/2023  Lower Venous DVT Study Patient Name:  VYTAUTAS BARBIERI  Date of Exam:   12/09/2023 Medical Rec #: [id number]      Accession #:    [id number] Date of Birth: Jan 04, 1941      Patient Gender: M Patient Age:   60 years Exam Location:  [facility] Procedure:      VAS [place] LOWER EXTREMITY VENOUS (DVT) Referring Phys: Stephania Fragmin --------------------------------------------------------------------------------  Indications: Pulmonary embolism.  Risk Factors: DVT HX of LLE DVT. Anticoagulation: Xarelto prior and during hospital stay. Now on lovenox due to PE on Xarelto. Comparison Study: Previous exam on 05/09/2022 was negative for DVT Performing Technologist: Ernestene Mention RVT, RDMS  Examination Guidelines: A complete evaluation includes B-mode imaging, spectral Doppler, color Doppler, and power Doppler as needed of all accessible portions of each vessel. Bilateral testing is considered an integral part of a complete examination. Limited examinations for reoccurring indications may be performed as noted. The reflux portion of the exam is performed with the patient in reverse Trendelenburg.  +---------+---------------+---------+-----------+----------+--------------+ RIGHT    CompressibilityPhasicitySpontaneityPropertiesThrombus Aging +---------+---------------+---------+-----------+----------+--------------+ CFV      Full           Yes      Yes                                 +---------+---------------+---------+-----------+----------+--------------+ SFJ      Full                                                        +---------+---------------+---------+-----------+----------+--------------+ FV Prox  Full  Yes      Yes                                 +---------+---------------+---------+-----------+----------+--------------+ FV Mid   Full           Yes      Yes                                 +---------+---------------+---------+-----------+----------+--------------+ FV DistalFull           Yes      Yes                                 +---------+---------------+---------+-----------+----------+--------------+ PFV      Full                                                        +---------+---------------+---------+-----------+----------+--------------+ POP      Partial        No       Yes                  Acute          +---------+---------------+---------+-----------+----------+--------------+ PTV      Partial        No       No                   Acute          +---------+---------------+---------+-----------+----------+--------------+ PERO     None           No       No                   Acute          +---------+---------------+---------+-----------+----------+--------------+   +---------+---------------+---------+-----------+----------+--------------+ LEFT     CompressibilityPhasicitySpontaneityPropertiesThrombus Aging +---------+---------------+---------+-----------+----------+--------------+ CFV      Full           Yes      Yes                                 +---------+---------------+---------+-----------+----------+--------------+ SFJ      Full                                                        +---------+---------------+---------+-----------+----------+--------------+ FV Prox  Full           Yes      Yes                                 +---------+---------------+---------+-----------+----------+--------------+ FV Mid   Full           Yes      Yes                                 +---------+---------------+---------+-----------+----------+--------------+  FV DistalFull           Yes      Yes                                  +---------+---------------+---------+-----------+----------+--------------+ PFV      Full                                                        +---------+---------------+---------+-----------+----------+--------------+ POP      Full           Yes      Yes                                 +---------+---------------+---------+-----------+----------+--------------+ PTV      Full                                                        +---------+---------------+---------+-----------+----------+--------------+ Soleal   Full                                                        +---------+---------------+---------+-----------+----------+--------------+     Summary: RIGHT: - Findings consistent with acute deep vein thrombosis involving the right peroneal veins, and right popliteal vein.   LEFT: - There is no evidence of deep vein thrombosis in the lower extremity.  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 Inflow velocity 11 cm/s. Outflow 13 cm/s. Collateral seen proximal to aneurysm. Seen on previous ultrasound from 05/09/2022. Distal ATA, PTA, and PeroA are all patent at the level of the ankle.  *See table(s) above for measurements and observations. Electronically signed by Heath Lark on 12/09/2023 at 4:06:36 PM.    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 Chest staging. * Tracking Code: BO * EXAM: CT CHEST WITH CONTRAST TECHNIQUE: Multidetector CT imaging of the chest was performed during intravenous contrast administration. RADIATION DOSE REDUCTION: This exam was performed according to the departmental dose-optimization program which includes automated exposure control, adjustment of the mA and/or kV according to patient size and/or use of iterative reconstruction technique. CONTRAST:   75mL OMNIPAQUE IOHEXOL 300 MG/ML  SOLN COMPARISON:  12/06/2023 CT abdomen/pelvis.  11/30/2023 chest CT. FINDINGS: Cardiovascular: Normal heart size. No significant pericardial effusion/thickening. Three-vessel coronary atherosclerosis. Atherosclerotic nonaneurysmal thoracic aorta. Normal caliber pulmonary arteries. Acute lobar and segmental right middle lobe (series 2/image 91) and segmental and subsegmental multifocal right lower lobe (series 2/images 96 and 109) pulmonary emboli. No saddle emboli. Mediastinum/Nodes: No significant thyroid nodules. Unremarkable esophagus. No axillary adenopathy. Bilateral paratracheal adenopathy up to 2.3 cm on the right (series 2/image 64), unchanged from recent 11/30/2023 chest CT. Stable mildly enlarged 1.2 cm subcarinal node (series 2/image 76).  Stable mildly enlarged bilateral retrocrural nodes, for example 1.3 cm on the right (series 2/image 158), unchanged. Mildly enlarged 1.1 cm right hilar node (series 2/image 88), unchanged. No discrete left hilar adenopathy. Lungs/Pleura: No pneumothorax. Trace posterior right pleural effusion is unchanged. No left pleural effusion. Severe centrilobular emphysema with diffuse bronchial wall thickening. Patchy consolidation in the posterior right lower lobe is similar. Numerous (greater than 10) indistinct solid pulmonary nodules scattered throughout both lungs, not substantially changed from recent chest CT, for example 0.9 cm in the posterior left lower lobe (series 8/image 99) and 0.8 cm in the anterior left upper lobe (series 8/image 68), new from more remote 11/26/2020 screening chest CT. Upper abdomen: Small volume upper abdominal ascites, unchanged from recent CT abdomen study. Several indistinct hypodense liver masses scattered throughout the visualized liver up to 2.5 cm in the lateral segment left liver on series 2/image 149, not substantially changed from recent CT abdomen study. Limited visualization of known pancreatic tail  mass. Musculoskeletal: Small sclerotic lesions in the manubrium (series 7/image 90) and posterior T6 vertebral body (series 7/image 91), new from 11/26/2020 CT. Mild superior T12 vertebral compression fracture, unchanged from 11/30/2023 chest CT. Marked thoracic spondylosis. IMPRESSION: 1. Acute lobar and segmental right middle lobe and segmental and subsegmental multifocal right lower lobe pulmonary emboli. No saddle emboli. 2. Numerous (greater than 10) indistinct solid pulmonary nodules scattered throughout both lungs, largest 0.9 cm in the left lower lobe, not substantially changed from recent 11/30/2023 chest CT, new from more remote 11/26/2020 screening chest CT, compatible with pulmonary metastases. 3. Stable mild mediastinal, bilateral retrocrural and right hilar adenopathy since 11/30/2023 chest CT, likely nodal metastases. 4. Small sclerotic lesions in the manubrium and posterior T6 vertebral body, new from 11/26/2020 CT, suspect osseous metastases. 5. Trace posterior right pleural effusion, unchanged. Patchy consolidation in the posterior right lower lobe is similar, which could represent atelectasis or mild pneumonia. 6. Please see the recent CT abdomen/pelvis report from 12/06/2023 for details regarding the upper abdominal findings. 7. Three-vessel coronary atherosclerosis. 8. Aortic Atherosclerosis (ICD10-I70.0) and Emphysema (ICD10-J43.9). Critical Value/emergent results were called by telephone at the time of interpretation on 12/09/2023 at 8:53 am to provider [facility] , who verbally acknowledged these results. Electronically Signed   By: Delbert Phenix M.D.   On: 12/09/2023 08:57   CT BIOPSY Result Date: 12/08/2023 INDICATION: 82 year old male with abdominal mass referred for biopsy EXAM: CT BIOPSY MEDICATIONS: None. ANESTHESIA/SEDATION: Moderate (conscious) sedation was not employed during this procedure. A total of Versed 0 mg and Fentanyl 25 mcg was administered intravenously. Moderate  Sedation Time: 0 minutes. The patient's level of consciousness and vital signs were monitored continuously by radiology nursing throughout the procedure under my direct supervision. FLUOROSCOPY TIME:  CT COMPLICATIONS: None PROCEDURE: Informed written consent was obtained from the patient after a thorough discussion of the procedural risks, benefits and alternatives. All questions were addressed. Maximal Sterile Barrier Technique was utilized including caps, mask, sterile gowns, sterile gloves, sterile drape, hand hygiene and skin antiseptic. A timeout was performed prior to the initiation of the procedure. Patient was positioned supine on the CT gantry table. Scout CT acquired for planning purposes. Using CT guidance, a guide needle was advanced into the mass of the right abdomen. Once we confirmed needle tip position, multiple 18 gauge core biopsy were acquired. Samples placed into formalin. Needle was removed and a final CT was acquired. Sterile bandage was applied. Patient tolerated the procedure well and remained hemodynamically stable throughout.  No complications were encountered and no significant blood loss. IMPRESSION: Status post CT-guided biopsy of right peritoneal abdominal mass Signed, Yvone Neu. Miachel Roux, RPVI [facility] Electronically Signed   By: Gilmer Mor D.O.   On: 12/08/2023 12:42   CT HEMATURIA WORKUP Result Date: 12/07/2023 CLINICAL DATA:  Hematuria. * Tracking Code: BO * EXAM: CT ABDOMEN AND PELVIS WITHOUT AND WITH CONTRAST TECHNIQUE: Multidetector CT imaging of the abdomen and pelvis was performed following the standard protocol before and following the bolus administration of intravenous contrast. RADIATION DOSE REDUCTION: This exam was performed according to the departmental dose-optimization program which includes automated exposure control, adjustment of the mA and/or kV according to patient size and/or use of  iterative reconstruction technique. CONTRAST:  OMNIPAQUE IOHEXOL 300 MG/ML  SOLN COMPARISON:  CT chest 11/30/2023 and CT AP 04/04/2020 FINDINGS: Lower chest: Small right pleural effusion identified. Emphysema. Patchy airspace densities identified within the posterior lung bases. Hepatobiliary: There are multifocal, ill-defined bilobar low-density liver lesions. These are new when compared with the study from 04/04/2020 this includes: -Segment 2/3 lesion measures 2.5 x 2.1 cm, image 19/4. -Segment 8 lesion measures 1.0 x 1.1 cm, image 14/4. -Segment 4B lesion measures 1.7 x 1.4 cm, image 24/4. Gallbladder appears normal. Common bile duct measures 1 cm, image 45/9. Formally 0.9 cm. No common bile duct stone or obstructing mass identified. Pancreas: Within the distal tail of pancreas there is a low-attenuation lesion which measures 3.8 x 2.5 cm, image 23/4. This abuts the posterior wall of the gastric fundus, image 23/4. This also appears to encase the splenic artery and vein. Small filling defect within the splenic vein posterior to this lesion is noted, image 42/9. 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 There does not appear to be involvement of the celiac trunk or SMA. Spleen: Solitary low-density lesion within the periphery of the upper pole of spleen measures 1.8 cm, image 10/4. 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 Adrenals/Urinary Tract: Normal adrenal glands. No nephrolithiasis or signs of obstructive uropathy. Left renal cortical scarring. Exophytic fluid density cyst off the lateral cortex of the left kidney measures 1.6 cm and is compatible with a Bosniak class 1 cyst, image 27/4. No follow-up imaging recommended. Urinary bladder is unremarkable. Stomach/Bowel: Stomach is nondistended. No pathologic dilatation of the large or small bowel loops. The appendix is  visualized and appears normal. Vascular/Lymphatic: Aortic atherosclerosis.  No aneurysm. Prominent retrocrural lymph nodes are identified bilaterally. Right retrocrural node measures 1.2 cm, image 12/4. Bilateral retroperitoneal adenopathy. Preaortic lymph node measures 1.3 cm, image 33/4. left retroperitoneal lymph node measures 1.4 cm, image 33/4. Retrocaval node measures 1.3 cm, image 31/4. Reproductive: Prostate gland enlargement. Other: Signs of peritoneal carcinomatosis identified. Small volume of perihepatic and pelvic ascites noted. Within the anterior right hemiabdomen there is a peritoneal nodule measuring 2.5 x 2.7 cm, image 37/4. Loculated fluid with surrounding enhancement in the central pelvis measures 3.2 x 1.8 cm, image 62/4. Within the central pelvis Musculoskeletal: Lumbar scoliosis and multilevel degenerative disc disease. Status post posterior decompression of the lumbar spine. Age-indeterminate superior endplate fracture deformity is identified at the T12 level with mild loss of the superior endplate, new from previous lumbar spine MRI dated 12/03/2022. IMPRESSION: 1. There is a low-attenuation lesion within the distal tail of pancreas which measures 3.8 x 2.5 cm. This abuts the posterior wall  of the gastric fundus. This also appears to encase the splenic artery and vein. Small filling defect within the splenic vein posterior to this lesion is noted. 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 2. Multifocal, ill-defined bilobar low-density liver lesions are new when compared with the study from 04/04/2020. 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 3. Signs of peritoneal carcinomatosis identified. Small volume of perihepatic and pelvic ascites noted. Loculated fluid with surrounding enhancement in the central pelvis measures 3.2 x 1.8 cm. Peritoneal nodule  identified within the anterior aspect of the right hemiabdomen 4. Bilateral retrocrural and retroperitoneal adenopathy. 5. Age-indeterminate superior endplate fracture deformity is identified at the T12 level with mild loss of the superior endplate, new from previous lumbar spine MRI dated 12/03/2022. 6. Small right pleural effusion. 7. Patchy airspace densities identified within the posterior lung bases. 8. Aortic Atherosclerosis (ICD10-I70.0) and Emphysema (ICD10-J43.9). Electronically Signed   By: Signa Kell M.D.   On: 12/07/2023 05:24   DG CHEST PORT 1 VIEW Result Date: 12/05/2023 CLINICAL DATA:  [id number] SOB (shortness of breath) [id number] EXAM: PORTABLE CHEST 1 VIEW COMPARISON:  Chest radiograph from one day prior. FINDINGS: Loop recorder overlies the medial left chest. Stable cardiomediastinal silhouette with normal heart size. No pneumothorax. No pleural effusion. Mild hazy patchy opacities throughout the right lung, most prominent in the lower right lung, similar. IMPRESSION: Mild hazy patchy opacities throughout the right lung, most prominent in the lower right lung, similar, compatible with multifocal pneumonia. Electronically Signed   By: Delbert Phenix M.D.   On: 12/05/2023 09:56   DG CHEST PORT 1 VIEW Result Date: 12/04/2023 CLINICAL DATA:  82 year old male with shortness of breath. EXAM: PORTABLE CHEST 1 VIEW COMPARISON:  Portable chest yesterday and earlier. FINDINGS: Portable AP semi upright view at 0511 hours. Emphysema with asymmetric pulmonary interstitial opacity on CT 11/30/2023 suspected to be interstitial edema. Stable lung volumes and mediastinal contours from yesterday. Left chest wall loop recorder or ICD. Stable ventilation from yesterday. Unresolved basilar predominant interstitial opacity. No pneumothorax, pleural effusion or consolidation identified. IMPRESSION: Ongoing stable ventilation. Emphysema with persistent basilar predominant interstitial opacity favored to be  interstitial edema. Electronically Signed   By: Odessa Fleming M.D.   On: 12/04/2023 08:36   DG CHEST PORT 1 VIEW Result Date: 12/03/2023 CLINICAL DATA:  [id number] with shortness of breath. EXAM: PORTABLE CHEST 1 VIEW COMPARISON:  Portable chest yesterday at 9:07 a.m. FINDINGS: 5:05 a.m. Stable mild cardiomegaly, mild central vascular prominence. Interstitial and patchy hazy opacities continue to be noted in the lower lung fields consistent with edema, pneumonia or combination. Small pleural effusions appear similar. The upper lung fields remain generally clear. Stable mediastinum with aortic tortuosity and atherosclerosis. No new osseous findings. Compare: Overall aeration seems unchanged. Left chest loop recorder device. IMPRESSION: 1. No significant change in aeration since yesterday. 2. Interstitial and patchy hazy opacities continue to be noted in the lower lung fields consistent with edema, pneumonia or combination. 3. Small pleural effusions. Electronically Signed   By: Almira Bar M.D.   On: 12/03/2023 08:02   DG CHEST PORT 1 VIEW Result Date: 12/02/2023 CLINICAL DATA:  Shortness of breath EXAM: PORTABLE CHEST 1 VIEW COMPARISON:  10/31/2023, 11/30/2023 FINDINGS: Left chest wall loop recorder. Stable heart size. Aortic atherosclerosis. Increased interstitial markings with progressive streaky right basilar opacity. No pleural effusion or pneumothorax. IMPRESSION: Increased interstitial markings with progressive streaky right basilar opacity,  suspicious for developing pneumonia. Electronically Signed   By: Duanne Guess D.O.   On: 12/02/2023 13:10   CT Chest Wo Contrast Result Date: 11/30/2023 CLINICAL DATA:  Recurrent falls 2 days ago. Left arm and leg weakness for the past 3 days. Clinical concern for pneumonia. EXAM: CT CHEST WITHOUT CONTRAST TECHNIQUE: Multidetector CT imaging of the chest was performed following the standard protocol without IV contrast. RADIATION DOSE REDUCTION: This exam was  performed according to the departmental dose-optimization program which includes automated exposure control, adjustment of the mA and/or kV according to patient size and/or use of iterative reconstruction technique. COMPARISON:  Chest radiographs dated 10/31/2023. Chest CT dated 11/26/2020. FINDINGS: Cardiovascular: Atheromatous calcifications, including the coronary arteries and aorta. Enlarged heart. Minimal pericardial fluid. Mildly enlarged central pulmonary arteries with a main pulmonary artery diameter of 3.2 cm. Mediastinum/Nodes: Interval multiple enlarged mediastinal nodes with the largest in the right precarinal region with a short axis diameter of 20 mm on image number 45/301. No grossly enlarged hilar nodes, difficult to assess with the lack of intravenous contrast. No enlarged axillary, supraclavicular or upper abdominal nodes. Unremarkable thyroid gland, trachea and esophagus. Lungs/Pleura: Increased prominence of the pulmonary vasculature and interstitial markings with mild right basilar and minimal left basilar dependent atelectasis. The atelectasis on the right is more patchy and somewhat confluent, compatible with possible pneumonia. Minimal right pleural effusion. Moderate centrilobular bullous changes. Small left lower lobe calcified granuloma. Small amount of varicose bronchiectasis and associated parenchymal densities in the anterior left upper lobe. Upper Abdomen: Small amount of free peritoneal fluid. The liver contours are mildly irregular with hypertrophy of the lateral segment of the left lobe and caudate lobe and a relatively small right lobe. The spleen remains lobulated and somewhat small. Musculoskeletal: Thoracic spine degenerative changes. IMPRESSION: 1. Mild congestive heart failure with a minimal right pleural effusion. 2. Mild right basilar and minimal left basilar dependent atelectasis. The atelectasis on the right is more patchy and somewhat confluent, suggesting possible  pneumonia. 3. Interval mediastinal adenopathy, nonspecific. 4. Moderate centrilobular bullous emphysematous changes. 5. Small amount of varicose bronchiectasis and associated parenchymal densities in the anterior left upper lobe. 6. Mildly enlarged central pulmonary arteries, suggesting pulmonary arterial hypertension. 7. Small amount of free peritoneal fluid. 8. Suggesting cirrhosis of the liver. 9.  Calcific coronary artery and aortic atherosclerosis. Aortic Atherosclerosis (ICD10-I70.0) and Emphysema (ICD10-J43.9). Electronically Signed   By: Beckie Salts M.D.   On: 11/30/2023 12:11   MR CERVICAL SPINE WO CONTRAST Result Date: 11/30/2023 CLINICAL DATA:  Cervical radiculopathy. Left-sided neck and shoulder pain with associated pain and numbness in the left hand. EXAM: MRI CERVICAL SPINE WITHOUT CONTRAST TECHNIQUE: Multiplanar, multisequence MR imaging of the cervical spine was performed. No intravenous contrast was administered. COMPARISON:  Cervical spine radiographs 11/10/2023. FINDINGS: Alignment: Trace retrolisthesis of C5 on C6. Vertebrae: Normal vertebral body heights. Multilevel degenerative endplate marrow signal changes, greatest at C5-6. Extensive T2 hyperintense marrow signal in the left C6 articular pillar with indistinct cortical margins on T1 weighted images. Cord: Normal spinal cord signal and volume. Posterior Fossa, vertebral arteries, paraspinal tissues: Unremarkable. Disc levels: C2-C3: Left-greater-than-right facet arthropathy and uncovertebral joint spurring results in moderate left neural foraminal narrowing. C3-C4: Disc bulge and ligamentum flavum buckling results mild spinal canal stenosis. Facet arthropathy and uncovertebral joint spurring contribute to moderate bilateral neural foraminal narrowing. C4-C5: Disc bulge and ligamentum flavum buckling results in mild spinal canal stenosis. Facet arthropathy and uncovertebral joint spurring contributes to severe bilateral neural  foraminal  narrowing. C5-C6: Disc bulge and ligamentum flavum buckling results in mild spinal canal stenosis. Facet arthropathy and uncovertebral joint spurring contribute to severe bilateral neural foraminal narrowing. C6-C7: Central disc extrusion with caudal migration and ligamentum flavum buckling results in moderate spinal canal stenosis. Left-greater-than-right facet arthropathy and uncovertebral joint spurring results in severe left and moderate right neural foraminal narrowing. C7-T1:  Normal. IMPRESSION: 1. Multilevel cervical spondylosis, worst at C6-7 where there is moderate spinal canal stenosis and severe left and moderate right neural foraminal narrowing. 2. Multilevel moderate to severe neural foraminal narrowing, as detailed above. 3. Extensive T2 hyperintense marrow signal in the left C6 articular pillar with indistinct cortical margins on T1 weighted images. This could represent advanced degenerative change; however, recommend correlation with cervical spine CT to exclude the possibility of a destructive, marrow replacing bone lesion. Electronically Signed   By: Orvan Falconer M.D.   On: 11/30/2023 11:54   CT Head Wo Contrast Result Date: 11/30/2023 CLINICAL DATA:  Polytrauma, blunt.  Recurrent falls 2 days ago. EXAM: CT HEAD WITHOUT CONTRAST TECHNIQUE: Contiguous axial images were obtained from the base of the skull through the vertex without intravenous contrast. RADIATION DOSE REDUCTION: This exam was performed according to the departmental dose-optimization program which includes automated exposure control, adjustment of the mA and/or kV according to patient size and/or use of iterative reconstruction technique. COMPARISON:  Head CT 08/04/2009.  MRI brain 08/05/2009. FINDINGS: Brain: No acute hemorrhage. Cortical gray-white differentiation is preserved. Patchy hypoattenuation of the cerebral white matter, most consistent with mild chronic small-vessel disease. Prominence of the ventricles and sulci  within normal limits for age. No extra-axial collection. Basilar cisterns are patent. Vascular: No hyperdense vessel or unexpected calcification. Skull: No calvarial fracture or suspicious bone lesion. Skull base is unremarkable. Sinuses/Orbits: No acute finding. Other: None. IMPRESSION: 1. No acute intracranial abnormality. 2. Mild chronic small-vessel disease. Electronically Signed   By: Orvan Falconer M.D.   On: 11/30/2023 11:39   Microbiology: Results for orders placed or performed during the hospital encounter of 11/30/23  Resp panel by RT-PCR (RSV, Flu A&B, Covid) Anterior Nasal Swab     Status: None   Collection Time: 11/30/23 10:19 AM   Specimen: Anterior Nasal Swab  Result Value Ref Range Status   SARS Coronavirus 2 by RT PCR NEGATIVE NEGATIVE Final    Comment: (NOTE) SARS-CoV-2 target nucleic acids are NOT DETECTED.  The SARS-CoV-2 RNA is generally detectable in upper respiratory specimens during the acute phase of infection. The lowest concentration of SARS-CoV-2 viral copies this assay can detect is 138 copies/mL. A negative result does not preclude SARS-Cov-2 infection and should not be used as the sole basis for treatment or other patient management decisions. A negative result may occur with  improper specimen collection/handling, submission of specimen other than nasopharyngeal swab, presence of viral mutation(s) within the areas targeted by this assay, and inadequate number of viral copies(<138 copies/mL). A negative result must be combined with clinical observations, patient history, and epidemiological information. The expected result is Negative.  Fact Sheet for Patients:  BloggerCourse.com  Fact Sheet for Healthcare Providers:  SeriousBroker.it  This test is no t yet approved or cleared by the [place] FDA and  has been authorized for detection and/or diagnosis of SARS-CoV-2 by FDA under an Emergency Use  Authorization (EUA). This EUA will remain  in effect (meaning this test can be used) for the duration of the COVID-19 declaration under Section 564(b)(1) of the Act, 21 U.S.C.section  360bbb-3(b)(1), unless the authorization is terminated  or revoked sooner.       Influenza A by PCR NEGATIVE NEGATIVE Final   Influenza B by PCR NEGATIVE NEGATIVE Final    Comment: (NOTE) The Xpert Xpress SARS-CoV-2/FLU/RSV plus assay is intended as an aid in the diagnosis of influenza from Nasopharyngeal swab specimens and should not be used as a sole basis for treatment. Nasal washings and aspirates are unacceptable for Xpert Xpress SARS-CoV-2/FLU/RSV testing.  Fact Sheet for Patients: BloggerCourse.com  Fact Sheet for Healthcare Providers: SeriousBroker.it  This test is not yet approved or cleared by the [place] FDA and has been authorized for detection and/or diagnosis of SARS-CoV-2 by FDA under an Emergency Use Authorization (EUA). This EUA will remain in effect (meaning this test can be used) for the duration of the COVID-19 declaration under Section 564(b)(1) of the Act, 21 U.S.C. section 360bbb-3(b)(1), unless the authorization is terminated or revoked.     Resp Syncytial Virus by PCR NEGATIVE NEGATIVE Final    Comment: (NOTE) Fact Sheet for Patients: BloggerCourse.com  Fact Sheet for Healthcare Providers: SeriousBroker.it  This test is not yet approved or cleared by the [place] FDA and has been authorized for detection and/or diagnosis of SARS-CoV-2 by FDA under an Emergency Use Authorization (EUA). This EUA will remain in effect (meaning this test can be used) for the duration of the COVID-19 declaration under Section 564(b)(1) of the Act, 21 U.S.C. section 360bbb-3(b)(1), unless the authorization is terminated or revoked.  Performed at [facility], [address] [place], [place] [address]    Labs: CBC: Recent Labs  Lab 12/05/23 [phone] 12/05/23 0924 12/06/23 0559 12/07/23 0519 12/09/23 0541 12/10/23 0602 12/11/23 0522  WBC 10.6*  --  9.0 8.5 9.9 8.7 9.5  NEUTROABS 7.7  --  6.6 5.8  --   --  6.6  HGB 11.1*   < > 10.2* 10.3* 9.2* 8.7* 9.1*  HCT 32.9*   < > 30.2* 31.6* 27.9* 26.2* 28.7*  MCV 93.5  --  91.5 94.0 94.3 94.9 98.3  PLT 241  --  224 218 178 187 223   < > = values in this interval not displayed.   Basic Metabolic Panel: Recent Labs  Lab 12/05/23 0649 12/06/23 0559 12/07/23 0519 12/09/23 0541 12/10/23 0602 12/11/23 0522  NA 135 134* 136 134* 134* 132*  K 4.4 3.8 4.1 4.7 4.9 4.6  CL 100 97* 100 99 98 99  CO2 31 30 30 28 28 25   GLUCOSE 122* 146* 123* 147* 133* 125*  BUN 26* 25* 25* 37* 46* 46*  CREATININE 0.93 0.85 0.85 1.05 1.34* 1.34*  CALCIUM 8.6* 8.8* 8.9 8.8* 8.7* 8.5*  MG 2.0 1.9 2.0 2.0 2.2 2.5*  PHOS 2.6 3.0 2.7 2.9  --  3.4   Liver Function Tests: Recent Labs  Lab 12/05/23 0649 12/06/23 0559 12/07/23 0519 12/11/23 0522  AST 24 22 23 25   ALT 15 14 13 13   ALKPHOS 74 75 73 70  BILITOT 0.7 0.9 0.8 0.7  PROT 5.2* 5.1* 5.1* 5.1*  ALBUMIN 2.3* 2.2* 2.3* 2.2*   CBG: Recent Labs  Lab 12/10/23 1126 12/10/23 1727 12/10/23 2032 12/11/23 0731 12/11/23 1205  GLUCAP 167* 144* 157* 124* 126*   Discharge time spent: greater than 30 minutes.  Signed: Marguerita Merles, DO [facility] 12/11/2023

## 2023-12-11 NOTE — Progress Notes (Signed)
Occupational Therapy Treatment Patient Details Name: Nathan Fernandez MRN: [id number] DOB: 07-20-1941 Today's Date: 12/11/2023   History of present illness Pt is an 82 yr old male admitted to the hospital with community-acquired pneumonia, weakness, and falls. Past medical history significant for CAD, COPD on room air, non-insulin-dependent type 2 diabetes, history of DVT on Xarelto, MI, OA, CVA, and lumbar laminectomy/decompression microdiscectomy.   OT comments  The pt was seen for general functional strengthening and progression of functional activity needed to facilitate improved ADL performance. He was assisted into sitting EOB where he required increased assist for lower body dressing/sock management. He further required assist to stand using a RW, then to take a couple steps to the bedside chair using a RW. He initiated self-feeding seated upright at chair level. He was noted to be with general deconditioning, reports of L shoulder pain and compromised endurance. Continue OT plan of care. Patient will benefit from continued inpatient follow up therapy, <3 hours/day.       If plan is discharge home, recommend the following:  Assist for transportation;Assistance with cooking/housework;Help with stairs or ramp for entrance;A little help with bathing/dressing/bathroom   Equipment Recommendations  Other (comment) (defer to next level of care)    Recommendations for Other Services      Precautions / Restrictions Precautions Precautions: Fall Restrictions Weight Bearing Restrictions Per Provider Order: No       Mobility Bed Mobility Overal bed mobility: Needs Assistance Bed Mobility: Supine to Sit     Supine to sit: Supervision          Transfers Overall transfer level: Needs assistance Equipment used: Rolling walker (2 wheels) Transfers: Sit to/from Stand Sit to Stand: Contact guard assist     Step pivot transfers: Contact guard assist     General transfer comment: Pt  required steadying assist, in order to transfer to the bedside chair using a RW.         ADL either performed or assessed with clinical judgement   ADL Overall ADL's : Needs assistance/impaired Eating/Feeding: Set up;Sitting Eating/Feeding Details (indicate cue type and reason): The pt initiated self-feeding seated in the bedside chair.                 Lower Body Dressing: Maximal assistance Lower Body Dressing Details (indicate cue type and reason): Pt reported decreased ability to access his feet, in order to don his socks seated EOB. As such, he required increased in this regard.                      Cognition Arousal: Alert Behavior During Therapy: WFL for tasks assessed/performed Overall Cognitive Status: Within Functional Limits for tasks assessed                      Pertinent Vitals/ Pain       Pain Assessment Pain Assessment: Faces Pain Score: 4  Pain Location: L shoulder Pain Intervention(s): Limited activity within patient's tolerance, Monitored during session, Heat applied         Frequency  Min 1X/week        Progress Toward Goals  OT Goals(current goals can now be found in the care plan section)  Progress towards OT goals: Progressing toward goals  Acute Rehab OT Goals OT Goal Formulation: With patient Time For Goal Achievement: 12/15/23 Potential to Achieve Goals: Good  Plan         AM-PAC OT "6 Clicks" Daily Activity  Outcome Measure   Help from another person eating meals?: None Help from another person taking care of personal grooming?: A Little Help from another person toileting, which includes using toliet, bedpan, or urinal?: A Little Help from another person bathing (including washing, rinsing, drying)?: A Little Help from another person to put on and taking off regular upper body clothing?: A Little Help from another person to put on and taking off regular lower body clothing?: A Lot 6 Click Score: 18    End  of Session Equipment Utilized During Treatment: Rolling walker (2 wheels);Oxygen  OT Visit Diagnosis: Unsteadiness on feet (R26.81);Muscle weakness (generalized) (M62.81)   Activity Tolerance Other (comment) (Fair tolerance)   Patient Left in chair;with call bell/phone within reach;with chair alarm set   Nurse Communication Mobility status        Time: 1308-6578 OT Time Calculation (min): 16 min  Charges: OT General Charges $OT Visit: 1 Visit OT Treatments $Therapeutic Activity: 8-22 mins     Reuben Likes, OTR/L 12/11/2023, 2:02 PM

## 2023-12-13 DIAGNOSIS — I82409 Acute embolism and thrombosis of unspecified deep veins of unspecified lower extremity: Secondary | ICD-10-CM | POA: Diagnosis not present

## 2023-12-13 DIAGNOSIS — J449 Chronic obstructive pulmonary disease, unspecified: Secondary | ICD-10-CM | POA: Diagnosis not present

## 2023-12-13 DIAGNOSIS — K625 Hemorrhage of anus and rectum: Secondary | ICD-10-CM | POA: Diagnosis not present

## 2023-12-13 DIAGNOSIS — R310 Gross hematuria: Secondary | ICD-10-CM | POA: Diagnosis not present

## 2023-12-13 DIAGNOSIS — E8809 Other disorders of plasma-protein metabolism, not elsewhere classified: Secondary | ICD-10-CM | POA: Diagnosis not present

## 2023-12-13 DIAGNOSIS — J440 Chronic obstructive pulmonary disease with acute lower respiratory infection: Secondary | ICD-10-CM | POA: Diagnosis not present

## 2023-12-13 DIAGNOSIS — K640 First degree hemorrhoids: Secondary | ICD-10-CM | POA: Diagnosis not present

## 2023-12-13 DIAGNOSIS — C259 Malignant neoplasm of pancreas, unspecified: Secondary | ICD-10-CM | POA: Diagnosis not present

## 2023-12-13 DIAGNOSIS — E1140 Type 2 diabetes mellitus with diabetic neuropathy, unspecified: Secondary | ICD-10-CM | POA: Diagnosis not present

## 2023-12-13 DIAGNOSIS — C786 Secondary malignant neoplasm of retroperitoneum and peritoneum: Secondary | ICD-10-CM | POA: Diagnosis not present

## 2023-12-14 ENCOUNTER — Ambulatory Visit: Payer: Medicare Other

## 2023-12-14 DIAGNOSIS — I639 Cerebral infarction, unspecified: Secondary | ICD-10-CM

## 2023-12-14 LAB — CUP PACEART REMOTE DEVICE CHECK
Date Time Interrogation Session: 20241222230103
Implantable Pulse Generator Implant Date: 20210525

## 2023-12-14 NOTE — Telephone Encounter (Signed)
Left message for patient to call back  

## 2023-12-14 NOTE — Telephone Encounter (Signed)
Patient's wife called back and is advised of follow up appointment 02/26/24. She verbalizes understanding and states this is fine, however, she states that Linquist was just diagnosed with pancreatic cancer and will be undergoing evaluation for that as well, so they may not keep appointment with GI, but will play it by ear.

## 2023-12-21 ENCOUNTER — Inpatient Hospital Stay: Payer: Medicare Other | Attending: Oncology | Admitting: Oncology

## 2023-12-21 ENCOUNTER — Encounter: Payer: Self-pay | Admitting: *Deleted

## 2023-12-21 ENCOUNTER — Encounter: Payer: Self-pay | Admitting: Oncology

## 2023-12-21 ENCOUNTER — Inpatient Hospital Stay: Payer: Medicare Other

## 2023-12-21 VITALS — BP 120/70 | HR 70 | Temp 98.8°F | Resp 18 | Ht 69.0 in | Wt 150.6 lb

## 2023-12-21 DIAGNOSIS — C786 Secondary malignant neoplasm of retroperitoneum and peritoneum: Secondary | ICD-10-CM | POA: Insufficient documentation

## 2023-12-21 DIAGNOSIS — Z87891 Personal history of nicotine dependence: Secondary | ICD-10-CM | POA: Diagnosis not present

## 2023-12-21 DIAGNOSIS — C259 Malignant neoplasm of pancreas, unspecified: Secondary | ICD-10-CM | POA: Insufficient documentation

## 2023-12-21 DIAGNOSIS — C7802 Secondary malignant neoplasm of left lung: Secondary | ICD-10-CM | POA: Diagnosis not present

## 2023-12-21 DIAGNOSIS — C787 Secondary malignant neoplasm of liver and intrahepatic bile duct: Secondary | ICD-10-CM | POA: Insufficient documentation

## 2023-12-21 DIAGNOSIS — Z86718 Personal history of other venous thrombosis and embolism: Secondary | ICD-10-CM | POA: Diagnosis not present

## 2023-12-21 DIAGNOSIS — R0789 Other chest pain: Secondary | ICD-10-CM | POA: Insufficient documentation

## 2023-12-21 DIAGNOSIS — C7801 Secondary malignant neoplasm of right lung: Secondary | ICD-10-CM | POA: Insufficient documentation

## 2023-12-21 LAB — CBC WITH DIFFERENTIAL/PLATELET
Abs Immature Granulocytes: 0.08 10*3/uL — ABNORMAL HIGH (ref 0.00–0.07)
Basophils Absolute: 0.1 10*3/uL (ref 0.0–0.1)
Basophils Relative: 1 %
Eosinophils Absolute: 0.4 10*3/uL (ref 0.0–0.5)
Eosinophils Relative: 4 %
HCT: 34 % — ABNORMAL LOW (ref 39.0–52.0)
Hemoglobin: 11.1 g/dL — ABNORMAL LOW (ref 13.0–17.0)
Immature Granulocytes: 1 %
Lymphocytes Relative: 12 %
Lymphs Abs: 1.3 10*3/uL (ref 0.7–4.0)
MCH: 31.1 pg (ref 26.0–34.0)
MCHC: 32.6 g/dL (ref 30.0–36.0)
MCV: 95.2 fL (ref 80.0–100.0)
Monocytes Absolute: 0.9 10*3/uL (ref 0.1–1.0)
Monocytes Relative: 9 %
Neutro Abs: 7.8 10*3/uL — ABNORMAL HIGH (ref 1.7–7.7)
Neutrophils Relative %: 73 %
Platelets: 335 10*3/uL (ref 150–400)
RBC: 3.57 MIL/uL — ABNORMAL LOW (ref 4.22–5.81)
RDW: 15 % (ref 11.5–15.5)
WBC: 10.5 10*3/uL (ref 4.0–10.5)
nRBC: 0 % (ref 0.0–0.2)

## 2023-12-21 LAB — COMPREHENSIVE METABOLIC PANEL
ALT: 15 U/L (ref 0–44)
AST: 24 U/L (ref 15–41)
Albumin: 3.3 g/dL — ABNORMAL LOW (ref 3.5–5.0)
Alkaline Phosphatase: 92 U/L (ref 38–126)
Anion gap: 8 (ref 5–15)
BUN: 37 mg/dL — ABNORMAL HIGH (ref 8–23)
CO2: 30 mmol/L (ref 22–32)
Calcium: 9.4 mg/dL (ref 8.9–10.3)
Chloride: 101 mmol/L (ref 98–111)
Creatinine, Ser: 1.22 mg/dL (ref 0.61–1.24)
GFR, Estimated: 59 mL/min — ABNORMAL LOW (ref >60)
Glucose, Bld: 154 mg/dL — ABNORMAL HIGH (ref 70–99)
Potassium: 5.3 mmol/L — ABNORMAL HIGH (ref 3.5–5.1)
Sodium: 139 mmol/L (ref 135–145)
Total Bilirubin: 0.4 mg/dL (ref 0.0–1.2)
Total Protein: 6.4 g/dL — ABNORMAL LOW (ref 6.5–8.1)

## 2023-12-21 LAB — CEA (ACCESS): CEA (CHCC): 10.94 ng/mL — ABNORMAL HIGH (ref 0.00–5.00)

## 2023-12-21 LAB — LACTATE DEHYDROGENASE: LDH: 202 U/L — ABNORMAL HIGH (ref 98–192)

## 2023-12-21 NOTE — Progress Notes (Signed)
PATIENT NAVIGATOR PROGRESS NOTE  Name: Nathan Fernandez Date: 12/21/2023 MRN: [id number]  DOB: 02-Oct-1941   Reason for visit:  New pt appt  Comments:    1. Have requested [facility] testing on accession number [id number]  2. Guardant 360 drawn today 3. PET scan scheduled for 12/24/23 at [facility], written instructions given    Time spent counseling/coordinating care: 

## 2023-12-21 NOTE — Assessment & Plan Note (Addendum)
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 Discussed the aggressive nature of the disease and the limited treatment options. Explained that the goal of chemotherapy is to control the cancer from further spreading and getting bigger, but it is not curable at this stage. Discussed the potential side effects of chemotherapy and the need for the patient to be strong enough to tolerate it.  -Today I discussed staging, prognosis, plan of care, treatment options.  Reviewed NCCN guidelines.  -Order PET scan to further evaluate the extent of the disease.  -Perform additional testing on the biopsy specimen to identify any actionable mutations for targeted treatment.  Also submitted request for Guardant360 to look for circulating tumor DNA and actionable mutations if any.  -He is ECOG performance status is 3, 2 at best.  It would be challenging to treat him with systemic chemotherapy, especially with aggressive regimens that are needed for pancreatic cancer.  -If performance status improves, we will consider chemotherapy with gemcitabine, tailored to patient's strength and tolerance.  -He is currently at a nursing home receiving rehab.  We hope his strength and appetite improves and he will return to his baseline prior to recent hospitalization.  -Plan to follow up in two weeks to discuss PET scan results and potential treatment options.

## 2023-12-21 NOTE — Progress Notes (Signed)
[facility]  ONCOLOGY CONSULT NOTE   PATIENT NAME: Nathan Fernandez   MR#: [id number] DOB: [birth year]  DATE OF SERVICE: 12/21/2023  REFERRING PHYSICIAN  Stephania Fragmin, MD  Patient Care Team: Joaquim Nam, MD as PCP - General (Family Medicine) Wendall Stade, MD as PCP - Cardiology (Cardiology) Wendall Stade, MD as Consulting Physician (Cardiology) Kathyrn Sheriff, [facility] (Inactive) as Pharmacist (Pharmacist) Otho Ket, RN as [facility] Care Management Maris Berger, MD as Consulting Physician (Ophthalmology)    CHIEF COMPLAINT/ PURPOSE OF CONSULTATION:   Newly diagnosed pancreatic adenocarcinoma with metastatic disease  ASSESSMENT & PLAN:   Nathan Fernandez is a 82 y.o. 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  Primary pancreatic adenocarcinoma (HCC) Newly diagnosed with metastasis to the liver, lungs, peritoneum and possibly bones. Discussed the aggressive nature of the disease and the limited treatment options. Explained that the goal of chemotherapy is to control the cancer from further spreading and getting bigger, but it is not curable at this stage. Discussed the potential side effects of chemotherapy and the need for the patient to be strong enough to tolerate it.  -Today I discussed staging, prognosis, plan of care, treatment options.  Reviewed NCCN guidelines.  -Order PET scan to further evaluate the extent of the disease.  -Perform additional testing on the biopsy specimen to identify any actionable mutations for targeted treatment.  Also submitted request for Guardant360 to look for circulating tumor DNA and actionable mutations if any.  -He  is ECOG performance status is 3, 2 at best.  It would be challenging to treat him with systemic chemotherapy, especially with aggressive regimens that are needed for pancreatic cancer.  -If performance status improves, we will consider chemotherapy with gemcitabine, tailored to patient's strength and tolerance.  -He is currently at a nursing home receiving rehab.  We hope his strength and appetite improves and he will return to his baseline prior to recent hospitalization.  -Plan to follow up in two weeks to discuss PET scan results and potential treatment options.  Chest wall pain Ongoing for at least three months, location above the heart and before the shoulder. Pain may be related to metastatic disease in the chest. -Continue to monitor and manage pain as needed.   I reviewed lab results and outside records for this visit and discussed relevant results with the patient. Diagnosis, plan of care and treatment options were also discussed in detail with the patient. Opportunity provided to ask questions and answers provided to his apparent satisfaction. Provided instructions to call our clinic with any problems, questions or concerns prior to return visit. I recommended to continue follow-up with PCP and sub-specialists. He verbalized understanding and agreed with the plan. No barriers to learning was detected.  NCCN guidelines have been consulted in the planning of this patient's care.  Meryl Crutch, MD  12/21/2023 4:48 PM  [facility] - [facility] DE[facility] [address] [place] [address] Dept: [phone] Dept Fax: [phone]   HISTORY OF PRESENTING ILLNESS:   Discussed the use of AI scribe software for clinical note transcription with the patient, who gave verbal consent to proceed.   I  have reviewed his chart and materials related to his cancer extensively and collaborated history with the patient.  Summary of oncologic history is as follows:  [facility]/30/2023 with complaints of multiple falls over the week prior, progressive fatigue and weakness in the left arm and leg.  Initial CT chest without contrast suggested findings consistent with community-acquired pneumonia.  He was found to be in acute hypoxic respiratory failure and was admitted to the hospital for further evaluation and management.  During the hospital stay, he later developed hematuria which led to CT abdomen and pelvis with and without IV contrast on 12/06/2023.  CT scan showed a low-attenuation lesion within the distal tail of the pancreas measuring 3.8 x 2.5 cm, abutting the posterior wall of the gastric fundus.  Lesion appeared to encase the splenic artery and vein.  Small filling defect within the splenic vein.  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  Findings were compatible with pancreatic adenocarcinoma.  Multifocal, ill-defined bilobar low-density liver lesions, compatible with metastatic disease.  Signs of peritoneal carcinomatosis identified with the peritoneal nodule identified within the anterior aspect of the right hemiabdomen.  Bilateral retrocrural and retroperitoneal adenopathy.  CA 19-9 was significantly elevated at 10,698 on 12/07/2023.  IR performed core needle biopsy of peritoneal mass on 12/08/2023.  Pathology showed moderately differentiated adenocarcinoma, morphologically consistent with a pancreatic primary.  Additional testing was not performed to preserve tissue for clinician directed testing.  On 12/09/2023, CT chest angiogram showed acute lobar and segmental right middle lobe and segmental and subsegmental multifocal right lower lobe pulmonary emboli.  No saddle emboli.  Numerous indistinct solid pulmonary nodules scattered throughout both lungs, largest measuring 0.9 cm in the left lower lobe.  This  was consistent with pulmonary metastasis.  Mild mediastinal, bilateral retrocrural and right hilar adenopathy, likely nodal metastasis.  Small sclerotic lesions in the manubrium and posterior T6 vertebral body, suspect osseous metastasis.  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  On his initial consultation with [place] on 12/21/2023, his ECOG performance status was low at 3, ECOG 2 at best.    Request submitted for staging PET/CT, Guardant360, foundation One testing on the specimen.  If performance status allows, we will plan for single agent palliative systemic treatment with gemcitabine.  Oncology History  Primary pancreatic adenocarcinoma (HCC)  12/21/2023 Initial Diagnosis   Primary pancreatic adenocarcinoma (HCC)   12/21/2023 Cancer Staging   Staging form: Exocrine Pancreas, AJCC 8th Edition - Clinical stage from 12/21/2023: Stage IV (cTX, cNX, pM1) - Signed by Meryl Crutch, MD on 12/21/2023     INTERVAL HISTORY:  He reports chest pain that started about three months ago. The pain is accompanied by weakness and numbness in his arm and leg. The symptoms initially led to a suspicion of a pinched nerve in his neck, and an MRI was conducted, the results of which are still pending. The patient was admitted to the hospital after an emergency room visit due to severe pain, where he was diagnosed with pneumonia. The patient also reports a significant weight loss of about thirty pounds over the past year. He has been experiencing a lack of appetite and has noticed a knot in his abdomen.  MEDICAL HISTORY:  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   Hosp R facial weakness   CAD (coronary artery disease)  MI, Hosp 1993, PTCA; LHC 04/18/13: Distal left main 40%, proximal LAD with several aneurysmal segments, proximal LAD 50% prior to and after aneurysmal segments, area does not appear to flow-limiting, proximal  diagonal 70%, ostial circumflex 20%, proximal circumflex 20%, OM1 30-40%, proximal RCA 40%, mid RCA 30%, distal RCA 20%, EF 50% => med Rx   Cancer (HCC)    skin cancer - 2020   COPD (chronic obstructive pulmonary disease) (HCC)    DM2 (diabetes mellitus, type 2) (HCC)    DVT (deep venous thrombosis) (HCC) 12/2021   History of ETT 1998   wnl   History of hiatal hernia    History of MRI 08/04/2009   brain- atrophy sm vess dz   HLD (hyperlipidemia)    HTN (hypertension)    Myocardial infarction (HCC)    OA (osteoarthritis)    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HCC)    05/09/12 [place]: 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, large collateral proximal to aneurysm   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   Stroke [facility])     SURGICAL HISTORY: Past Surgical History:  Procedure Laterality Date   BUBBLE STUDY  05/15/2020   Procedure: BUBBLE STUDY;  Surgeon: Chrystie Nose, MD;  Location: [facility] ENDOSCOPY;  Service: Cardiovascular;;   CARDIAC CATHETERIZATION     cataract surgery  08/2005   repair lens which moved   EYE SURGERY     LEFT HEART CATH AND CORONARY ANGIOGRAPHY N/A 01/21/2019   Procedure: LEFT HEART CATH AND CORONARY ANGIOGRAPHY;  Surgeon: Kathleene Hazel, MD;  Location: [facility] INVASIVE CV LAB;  Service: Cardiovascular;  Laterality: N/A;   LEFT HEART CATH AND CORONARY ANGIOGRAPHY N/A 02/13/2021   Procedure: LEFT HEART CATH AND CORONARY ANGIOGRAPHY;  Surgeon: Iran Ouch, MD;  Location: [facility] INVASIVE CV LAB;  Service: Cardiovascular;  Laterality: N/A;   LOOP RECORDER INSERTION N/A 05/15/2020   Procedure: LOOP RECORDER INSERTION;  Surgeon: Hillis Range, MD;  Location: [facility] INVASIVE CV LAB;  Service: Cardiovascular;  Laterality: N/A;   LUMBAR LAMINECTOMY/DECOMPRESSION MICRODISCECTOMY N/A 06/19/2021   Procedure: Lumbar Two-Three, Lumbar Three-Four, Lumbar Four-Five Laminectomy and Foraminotomy;  Surgeon: Donalee Citrin, MD;   Location: [facility] OR;  Service: Neurosurgery;  Laterality: N/A;   LUMBAR LAMINECTOMY/DECOMPRESSION MICRODISCECTOMY Bilateral 07/28/2022   Procedure: Sublaminar decompression - Lumbar two-Lumbar three - bilateral redo;  Surgeon: Donalee Citrin, MD;  Location: [facility] OR;  Service: Neurosurgery;  Laterality: Bilateral;  3C   RIGHT/LEFT HEART CATH AND CORONARY ANGIOGRAPHY N/A 09/29/2023   Procedure: RIGHT/LEFT HEART CATH AND CORONARY ANGIOGRAPHY;  Surgeon: Elder Negus, MD;  Location: [facility] INVASIVE CV LAB;  Service: Cardiovascular;  Laterality: N/A;   stress myoview  06/29/2006   sm distal anteroseptal & apical infarct   TEE WITHOUT CARDIOVERSION N/A 05/15/2020   Procedure: TRANSESOPHAGEAL ECHOCARDIOGRAM (TEE);  Surgeon: Chrystie Nose, MD;  Location[facility] END[facility]COPY;  Service: Cardiovascular;  Laterality: N/A;   THYROIDECTOMY, PARTIAL  1967   B9 growth   TONSILLECTOMY      SOCIAL HISTORY: Social History   Socioeconomic History   Marital status: Married    Spouse name: Steward Drone   Number of children: 4   Years of education: Not on file   Highest education level: High school graduate  Occupational History   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[employer]    Comment: for [employer] with [employer]   Occupation: retired  Tobacco Use   Smoking status:  Former    Types: Cigars   Smokeless tobacco: Never   Tobacco comments:    cigar occassionally  Vaping Use   Vaping status: Never Used  Substance and Sexual Activity   Alcohol use: Yes    Comment: occasional   Drug use: No   Sexual activity: Not on file  Other Topics Concern   Not on file  Social History Narrative   Divorced, lives with partner Steward Drone)- married 09/2015   3 living children, had a daughter who died at age 68 in November 05, 2024   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, retired as of 2019   Social Drivers of Health   Financial Resource Strain: Low Risk  (11/24/2023)   Overall Financial Resource Strain (CARDIA)    Difficulty  of Paying Living Expenses: Not very hard  Food Insecurity: No Food Insecurity (12/21/2023)   Hunger Vital Sign    Worried About Running Out of Food in the Last Year: Never true    Ran Out of Food in the Last Year: Never true  Transportation Needs: No Transportation Needs (12/21/2023)   PRAPARE - Administrator, Civil Service (Medical): No    Lack of Transportation (Non-Medical): No  Physical Activity: Inactive (11/24/2023)   Exercise Vital Sign    Days of Exercise per Week: 0 days    Minutes of Exercise per Session: 0 min  Stress: Stress Concern Present (11/24/2023)   [employer] of Occupational Health - Occupational Stress Questionnaire    Feeling of Stress : To some extent  Social Connections: Socially Integrated (11/24/2023)   Social Connection and Isolation Panel [NHANES]    Frequency of Communication with Friends and Family: More than three times a week    Frequency of Social Gatherings with Friends and Family: More than three times a week    Attends Religious Services: More than 4 times per year    Active Member of [employer] or Organizations: Yes    Attends Engineer, structural: More than 4 times per year    Marital Status: Married  Catering manager Violence: Not At Risk (12/21/2023)   Humiliation, Afraid, Rape, and Kick questionnaire    Fear of Current or Ex-Partner: No    Emotionally Abused: No    Physically Abused: No    Sexually Abused: No    FAMILY HISTORY: Family History  Problem Relation Age of Onset   Hypertension Mother    Aneurysm Mother    Stroke Mother    Hypertension Father    Heart disease Father        CAD   Heart disease Son        MI   Colon cancer Neg Hx    Prostate cancer Neg Hx     ALLERGIES:  He is allergic to ace inhibitors, angiotensin receptor blockers, vasotec [enalapril], and pravachol [pravastatin].  MEDICATIONS:  Current Outpatient Medications  Medication Sig Dispense Refill   acetaminophen (TYLENOL) 325 MG  tablet Take 2 tablets (650 mg total) by mouth every 6 (six) hours as needed for mild pain (pain score 1-3) (or Fever >/= 101).     albuterol (VENTOLIN HFA) 108 (90 Base) MCG/ACT inhaler Inhale 1-2 puffs into the lungs every 6 (six) hours as needed (COPD). 1 each 3   Ascorbic Acid (VITAMIN C PO) Take 1 tablet by mouth daily.     aspirin EC 81 MG tablet Take 81 mg by mouth daily. Swallow whole.     B Complex-C (B-COMPLEX WITH VITAMIN C) tablet Take 1 tablet  by mouth daily.     buPROPion (WELLBUTRIN SR) 150 MG 12 hr tablet Take 1 tablet (150 mg total) by mouth daily.     carvedilol (COREG) 6.25 MG tablet TAKE 1 TABLET BY MOUTH TWICE A DAY 180 tablet 1   Cholecalciferol (VITAMIN D-3 PO) Take 1 capsule by mouth daily.     Cyanocobalamin (B-12 PO) Take 1 tablet by mouth daily.     cyclobenzaprine (FLEXERIL) 5 MG tablet Take 1 tablet (5 mg total) by mouth 3 (three) times daily as needed for muscle spasms.     dorzolamide-timolol (COSOPT) 22.3-6.8 MG/ML ophthalmic solution Place 1 drop into both eyes 2 (two) times daily.     enoxaparin (LOVENOX) 80 MG/0.8ML injection Inject 0.65 mLs (65 mg total) into the skin 2 (two) times daily.     feeding supplement (ENSURE ENLIVE / ENSURE PLUS) LIQD Take 237 mLs by mouth 2 (two) times daily between meals. 237 mL 12   furosemide (LASIX) 40 MG tablet Take 1 tablet (40 mg total) by mouth daily.     gabapentin (NEURONTIN) 300 MG capsule Take 1 capsule (300 mg total) by mouth 3 (three) times daily.     hydrALAZINE (APRESOLINE) 10 MG tablet TAKE 1 TABLET BY MOUTH EVERY 12 HOURS. 180 tablet 3   hydrocortisone cream 0.5 % Apply topically 2 (two) times daily as needed for itching. 30 g 0   isosorbide mononitrate (IMDUR) 30 MG 24 hr tablet TAKE 1/2 TABLET BY MOUTH DAILY 45 tablet 3   lidocaine (XYLOCAINE) 5 % ointment Apply 1 Application topically as needed. 35.44 g 0   nitroGLYCERIN (NITROSTAT) 0.4 MG SL tablet Place 1 tablet (0.4 mg total) under the tongue every 5 (five)  minutes as needed for chest pain. Up to 3 doses 25 tablet 3   polyethylene glycol powder (GLYCOLAX/MIRALAX) 17 GM/SCOOP powder Take 17 g by mouth daily as needed.     rosuvastatin (CRESTOR) 20 MG tablet TAKE 1 TABLET BY MOUTH EVERY DAY 90 tablet 1   spironolactone (ALDACTONE) 25 MG tablet Take 0.5 tablets (12.5 mg total) by mouth daily.     traMADol (ULTRAM) 50 MG tablet Take 50 mg by mouth every 6 (six) hours as needed (Per MAR from [facility]).     traZODone (DESYREL) 100 MG tablet Take 1-2 tablets (100-200 mg total) by mouth at bedtime. (Patient taking differently: Take 100 mg by mouth at bedtime.)     B Complex CAPS Take 1 capsule by mouth daily. (Patient not taking: Reported on 12/21/2023)     ELDERBERRY PO Take 1 capsule by mouth in the morning and at bedtime. (Patient not taking: Reported on 12/21/2023)     fenofibrate (TRICOR) 48 MG tablet TAKE 1 TABLET BY MOUTH EVERY DAY (Patient not taking: Reported on 12/21/2023) 90 tablet 0   Glucosamine-Chondroitin (GLUCOSAMINE CHONDR COMPLEX PO) Take 1 tablet by mouth daily. (Patient not taking: Reported on 12/21/2023)     hydrocortisone (ANUSOL-HC) 25 MG suppository Place 1 suppository (25 mg total) rectally 2 (two) times daily. (Patient not taking: Reported on 12/21/2023) 12 suppository 0   ketoconazole (NIZORAL) 2 % cream APPLY 1 APPLICATION TOPICALLY DAILY AS NEEDED FOR IRRITATION. (Patient not taking: Reported on 12/21/2023) 30 g 2   ondansetron (ZOFRAN) 4 MG tablet Take 1 tablet (4 mg total) by mouth every 6 (six) hours as needed for nausea. (Patient not taking: Reported on 12/21/2023) 20 tablet 0   VITAMIN D PO Take 1 tablet by mouth daily. (Patient not  taking: Reported on 12/21/2023)     No current facility-administered medications for this visit.    REVIEW OF SYSTEMS:    Review of Systems - Oncology  All other pertinent systems were reviewed with the patient and are negative.  PHYSICAL EXAMINATION:  ECOG PERFORMANCE  STATUS: 3 - Symptomatic, >50% confined to bed  Vitals:   12/21/23 1446  BP: 120/70  Pulse: 70  Resp: 18  Temp: 98.8 F (37.1 C)  SpO2: 95%   Filed Weights   12/21/23 1446  Weight: 150 lb 9.6 oz (68.3 kg)    Physical Exam Constitutional:      General: He is not in acute distress.    Comments: Nathan Fernandez, presented to clinic in a wheelchair  HENT:     Head: Normocephalic and atraumatic.  Eyes:     General: No scleral icterus.    Conjunctiva/sclera: Conjunctivae normal.  Cardiovascular:     Rate and Rhythm: Normal rate and regular rhythm.     Heart sounds: Normal heart sounds.  Pulmonary:     Effort: Pulmonary effort is normal.     Breath sounds: Normal breath sounds.     Comments: Wearing O2 via nasal cannula Abdominal:     General: There is no distension.     Comments: Palpable nodule on the right side of abdomen  Musculoskeletal:     Right lower leg: No edema.     Left lower leg: No edema.  Neurological:     General: No focal deficit present.     Mental Status: He is alert and oriented to person, place, and time.  Psychiatric:     Comments: Tearful during the interview     LABORATORY DATA:   I have reviewed the data as listed.  Results for orders placed or performed in visit on 12/21/23  CBC with Differential/Platelet  Result Value Ref Range   WBC 10.5 4.0 - 10.5 K/uL   RBC 3.57 (L) 4.22 - 5.81 MIL/uL   Hemoglobin 11.1 (L) 13.0 - 17.0 g/dL   HCT 04.5 (L) 40.9 - 81.1 %   MCV 95.2 80.0 - 100.0 fL   MCH 31.1 26.0 - 34.0 pg   MCHC 32.6 30.0 - 36.0 g/dL   RDW 91.4 78.2 - 95.6 %   Platelets 335 150 - 400 K/uL   nRBC 0.0 0.0 - 0.2 %   Neutrophils Relative % 73 %   Neutro Abs 7.8 (H) 1.7 - 7.7 K/uL   Lymphocytes Relative 12 %   Lymphs Abs 1.3 0.7 - 4.0 K/uL   Monocytes Relative 9 %   Monocytes Absolute 0.9 0.1 - 1.0 K/uL   Eosinophils Relative 4 %   Eosinophils Absolute 0.4 0.0 - 0.5 K/uL   Basophils Relative 1 %   Basophils Absolute 0.1 0.0 - 0.1  K/uL   Immature Granulocytes 1 %   Abs Immature Granulocytes 0.08 (H) 0.00 - 0.07 K/uL      RADIOGRAPHIC STUDIES:  I have personally reviewed the radiological images as listed and agree with the findings in the report.  CUP PACEART REMOTE DEVICE CHECK Result Date: 12/14/2023 ILR summary report received. Battery status OK. Normal device function. No new symptom, tachy, brady, or pause episodes. No new AF episodes. Monthly summary reports and ROV/PRN KS, CVRS  DG CHEST PORT 1 VIEW Result Date: 12/11/2023 CLINICAL DATA:  Shortness of breath. EXAM: PORTABLE CHEST 1 VIEW COMPARISON:  December 05, 2023. FINDINGS: The heart size and mediastinal contours are within normal limits. Increased interstitial  densities are noted throughout both lungs concerning for edema or atypical infection. The visualized skeletal structures are unremarkable. IMPRESSION: Increased interstitial densities are noted bilaterally as described above. Electronically Signed   By: Lupita Raider M.D.   On: 12/11/2023 08:36   VAS [place] LOWER EXTREMITY VENOUS (DVT) Result Date: 12/09/2023  Lower Venous DVT Study Patient Name:  Nathan Fernandez  Date of Exam:   12/09/2023 Medical Rec #: [id number]      Accession #:    [id number] Date of Birth: February 29, 1941      Patient Gender: M Patient Age:   79 years Exam Location:  [facility] Procedure:      VAS [place] LOWER EXTREMITY VENOUS (DVT) Referring Phys: Stephania Fragmin --------------------------------------------------------------------------------  Indications: Pulmonary embolism.  Risk Factors: DVT HX of LLE DVT. Anticoagulation: Xarelto prior and during hospital stay. Now on lovenox due to PE on Xarelto. Comparison Study: Previous exam on 05/09/2022 was negative for DVT Performing Technologist: Ernestene Mention RVT, RDMS  Examination Guidelines: A complete evaluation includes B-mode imaging, spectral Doppler, color Doppler, and power Doppler as needed of all accessible portions of each vessel.  Bilateral testing is considered an integral part of a complete examination. Limited examinations for reoccurring indications may be performed as noted. The reflux portion of the exam is performed with the patient in reverse Trendelenburg.  +---------+---------------+---------+-----------+----------+--------------+ RIGHT    CompressibilityPhasicitySpontaneityPropertiesThrombus Aging +---------+---------------+---------+-----------+----------+--------------+ CFV      Full           Yes      Yes                                 +---------+---------------+---------+-----------+----------+--------------+ SFJ      Full                                                        +---------+---------------+---------+-----------+----------+--------------+ FV Prox  Full           Yes      Yes                                 +---------+---------------+---------+-----------+----------+--------------+ FV Mid   Full           Yes      Yes                                 +---------+---------------+---------+-----------+----------+--------------+ FV DistalFull           Yes      Yes                                 +---------+---------------+---------+-----------+----------+--------------+ PFV      Full                                                        +---------+---------------+---------+-----------+----------+--------------+ POP      Partial        No  Yes                  Acute          +---------+---------------+---------+-----------+----------+--------------+ PTV      Partial        No       No                   Acute          +---------+---------------+---------+-----------+----------+--------------+ PERO     None           No       No                   Acute          +---------+---------------+---------+-----------+----------+--------------+   +---------+---------------+---------+-----------+----------+--------------+ LEFT      CompressibilityPhasicitySpontaneityPropertiesThrombus Aging +---------+---------------+---------+-----------+----------+--------------+ CFV      Full           Yes      Yes                                 +---------+---------------+---------+-----------+----------+--------------+ SFJ      Full                                                        +---------+---------------+---------+-----------+----------+--------------+ FV Prox  Full           Yes      Yes                                 +---------+---------------+---------+-----------+----------+--------------+ FV Mid   Full           Yes      Yes                                 +---------+---------------+---------+-----------+----------+--------------+ FV DistalFull           Yes      Yes                                 +---------+---------------+---------+-----------+----------+--------------+ PFV      Full                                                        +---------+---------------+---------+-----------+----------+--------------+ POP      Full           Yes      Yes                                 +---------+---------------+---------+-----------+----------+--------------+ PTV      Full                                                        +---------+---------------+---------+-----------+----------+--------------+  Soleal   Full                                                        +---------+---------------+---------+-----------+----------+--------------+     Summary: RIGHT: - Findings consistent with acute deep vein thrombosis involving the right peroneal veins, and right popliteal vein.   LEFT: - There is no evidence of deep vein thrombosis in the lower extremity.  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 Inflow velocity 11 cm/s. Outflow 13 cm/s. Collateral seen proximal to aneurysm. Seen on previous ultrasound from 05/09/2022. Distal ATA, PTA,  and PeroA are all patent at the level of the ankle.  *See table(s) above for measurements and observations. Electronically signed by Heath Lark on 12/09/2023 at 4:06:36 PM.    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 Chest staging. * Tracking Code: BO * EXAM: CT CHEST WITH CONTRAST TECHNIQUE: Multidetector CT imaging of the chest was performed during intravenous contrast administration. RADIATION DOSE REDUCTION: This exam was performed according to the departmental dose-optimization program which includes automated exposure control, adjustment of the mA and/or kV according to patient size and/or use of iterative reconstruction technique. CONTRAST:  75mL OMNIPAQUE IOHEXOL 300 MG/ML  SOLN COMPARISON:  12/06/2023 CT abdomen/pelvis.  11/30/2023 chest CT. FINDINGS: Cardiovascular: Normal heart size. No significant pericardial effusion/thickening. Three-vessel coronary atherosclerosis. Atherosclerotic nonaneurysmal thoracic aorta. Normal caliber pulmonary arteries. Acute lobar and segmental right middle lobe (series 2/image 91) and segmental and subsegmental multifocal right lower lobe (series 2/images 96 and 109) pulmonary emboli. No saddle emboli. Mediastinum/Nodes: No significant thyroid nodules. Unremarkable esophagus. No axillary adenopathy. Bilateral paratracheal adenopathy up to 2.3 cm on the right (series 2/image 64), unchanged from recent 11/30/2023 chest CT. Stable mildly enlarged 1.2 cm subcarinal node (series 2/image 76). Stable mildly enlarged bilateral retrocrural nodes, for example 1.3 cm on the right (series 2/image 158), unchanged. Mildly enlarged 1.1 cm right hilar node (series 2/image 88), unchanged. No discrete left hilar adenopathy. Lungs/Pleura: No pneumothorax. Trace posterior right pleural effusion is unchanged. No left pleural  effusion. Severe centrilobular emphysema with diffuse bronchial wall thickening. Patchy consolidation in the posterior right lower lobe is similar. Numerous (greater than 10) indistinct solid pulmonary nodules scattered throughout both lungs, not substantially changed from recent chest CT, for example 0.9 cm in the posterior left lower lobe (series 8/image 99) and 0.8 cm in the anterior left upper lobe (series 8/image 68), new from more remote 11/26/2020 screening chest CT. Upper abdomen: Small volume upper abdominal ascites, unchanged from recent CT abdomen study. Several indistinct hypodense liver masses scattered throughout the visualized liver up to 2.5 cm in the lateral segment left liver on series 2/image 149, not substantially changed from recent CT abdomen study. Limited visualization of known pancreatic tail mass. Musculoskeletal: Small sclerotic lesions in the manubrium (series 7/image 90) and posterior T6 vertebral body (series 7/image 91), new from 11/26/2020 CT. Mild superior T12 vertebral compression fracture, unchanged from 11/30/2023 chest CT. Marked thoracic spondylosis. IMPRESSION: 1. Acute lobar and segmental right middle lobe and segmental and subsegmental multifocal right lower lobe pulmonary emboli. No saddle emboli. 2. Numerous (greater than 10) indistinct solid pulmonary  nodules scattered throughout both lungs, largest 0.9 cm in the left lower lobe, not substantially changed from recent 11/30/2023 chest CT, new from more remote 11/26/2020 screening chest CT, compatible with pulmonary metastases. 3. Stable mild mediastinal, bilateral retrocrural and right hilar adenopathy since 11/30/2023 chest CT, likely nodal metastases. 4. Small sclerotic lesions in the manubrium and posterior T6 vertebral body, new from 11/26/2020 CT, suspect osseous metastases. 5. Trace posterior right pleural effusion, unchanged. Patchy consolidation in the posterior right lower lobe is similar, which could represent  atelectasis or mild pneumonia. 6. Please see the recent CT abdomen/pelvis report from 12/06/2023 for details regarding the upper abdominal findings. 7. Three-vessel coronary atherosclerosis. 8. Aortic Atherosclerosis (ICD10-I70.0) and Emphysema (ICD10-J43.9). Critical Value/emergent results were called by telephone at the time of interpretation on 12/09/2023 at 8:53 am to provider [facility] , who verbally acknowledged these results. Electronically Signed   By: Delbert Phenix M.D.   On: 12/09/2023 08:57   CT BIOPSY Result Date: 12/08/2023 INDICATION: 82 year old male with abdominal mass referred for biopsy EXAM: CT BIOPSY MEDICATIONS: None. ANESTHESIA/SEDATION: Moderate (conscious) sedation was not employed during this procedure. A total of Versed 0 mg and Fentanyl 25 mcg was administered intravenously. Moderate Sedation Time: 0 minutes. The patient's level of consciousness and vital signs were monitored continuously by radiology nursing throughout the procedure under my direct supervision. FLUOROSCOPY TIME:  CT COMPLICATIONS: None PROCEDURE: Informed written consent was obtained from the patient after a thorough discussion of the procedural risks, benefits and alternatives. All questions were addressed. Maximal Sterile Barrier Technique was utilized including caps, mask, sterile gowns, sterile gloves, sterile drape, hand hygiene and skin antiseptic. A timeout was performed prior to the initiation of the procedure. Patient was positioned supine on the CT gantry table. Scout CT acquired for planning purposes. Using CT guidance, a guide needle was advanced into the mass of the right abdomen. Once we confirmed needle tip position, multiple 18 gauge core biopsy were acquired. Samples placed into formalin. Needle was removed and a final CT was acquired. Sterile bandage was applied. Patient tolerated the procedure well and remained hemodynamically stable throughout. No complications were encountered and no significant  blood loss. IMPRESSION: Status post CT-guided biopsy of right peritoneal abdominal mass Signed, Yvone Neu. Miachel Roux, RPVI [facility] Radiology Electronically Signed   By: Gilmer Mor D.O.   On: 12/08/2023 12:42   CT HEMATURIA WORKUP Result Date: 12/07/2023 CLINICAL DATA:  Hematuria. * Tracking Code: BO * EXAM: CT ABDOMEN AND PELVIS WITHOUT AND WITH CONTRAST TECHNIQUE: Multidetector CT imaging of the abdomen and pelvis was performed following the standard protocol before and following the bolus administration of intravenous contrast. RADIATION DOSE REDUCTION: This exam was performed according to the departmental dose-optimization program which includes automated exposure control, adjustment of the mA and/or kV according to patient size and/or use of iterative reconstruction technique. CONTRAST:  OMNIPAQUE IOHEXOL 300 MG/ML  SOLN COMPARISON:  CT chest 11/30/2023 and CT AP 04/04/2020 FINDINGS: Lower chest: Small right pleural effusion identified. Emphysema. Patchy airspace densities identified within the posterior lung bases. Hepatobiliary: There are multifocal, ill-defined bilobar low-density liver lesions. These are new when compared with the study from 04/04/2020 this includes: -Segment 2/3 lesion measures 2.5 x 2.1 cm, image 19/4. -Segment 8 lesion measures 1.0 x 1.1 cm, image 14/4. -Segment 4B lesion measures 1.7 x 1.4 cm, image 24/4. Gallbladder appears normal. Common bile duct measures 1 cm, image 45/9. Formally 0.9 cm. No common bile  duct stone or obstructing mass identified. Pancreas: Within the distal tail of pancreas there is a low-attenuation lesion which measures 3.8 x 2.5 cm, image 23/4. This abuts the posterior wall of the gastric fundus, image 23/4. This also appears to encase the splenic artery and vein. Small filling defect within the splenic vein posterior to this lesion is noted, image 42/9. 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 There does not appear to be involvement of the celiac trunk or SMA. Spleen: Solitary low-density lesion within the periphery of the upper pole of spleen measures 1.8 cm, image 10/4. 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 Adrenals/Urinary Tract: Normal adrenal glands. No nephrolithiasis or signs of obstructive uropathy. Left renal cortical scarring. Exophytic fluid density cyst off the lateral cortex of the left kidney measures 1.6 cm and is compatible with a Bosniak class 1 cyst, image 27/4. No follow-up imaging recommended. Urinary bladder is unremarkable. Stomach/Bowel: Stomach is nondistended. No pathologic dilatation of the large or small bowel loops. The appendix is visualized and appears normal. Vascular/Lymphatic: Aortic atherosclerosis.  No aneurysm. Prominent retrocrural lymph nodes are identified bilaterally. Right retrocrural node measures 1.2 cm, image 12/4. Bilateral retroperitoneal adenopathy. Preaortic lymph node measures 1.3 cm, image 33/4. left retroperitoneal lymph node measures 1.4 cm, image 33/4. Retrocaval node measures 1.3 cm, image 31/4. Reproductive: Prostate gland enlargement. Other: Signs of peritoneal carcinomatosis identified. Small volume of perihepatic and pelvic ascites noted. Within the anterior right hemiabdomen there is a peritoneal nodule measuring 2.5 x 2.7 cm, image 37/4. Loculated fluid with surrounding enhancement in the central pelvis measures 3.2 x 1.8 cm, image 62/4. Within the central pelvis Musculoskeletal: Lumbar scoliosis and multilevel degenerative disc disease. Status post posterior decompression of the lumbar spine. Age-indeterminate superior endplate fracture deformity is identified at the T12 level with mild loss of the superior endplate, new from previous lumbar spine MRI dated 12/03/2022. IMPRESSION: 1. There is a low-attenuation  lesion within the distal tail of pancreas which measures 3.8 x 2.5 cm. This abuts the posterior wall of the gastric fundus. This also appears to encase the splenic artery and vein. Small filling defect within the splenic vein posterior to this lesion is noted. 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 2. Multifocal, ill-defined bilobar low-density liver lesions are new when compared with the study from 04/04/2020. 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 3. Signs of peritoneal carcinomatosis identified. Small volume of perihepatic and pelvic ascites noted. Loculated fluid with surrounding enhancement in the central pelvis measures 3.2 x 1.8 cm. Peritoneal nodule identified within the anterior aspect of the right hemiabdomen 4. Bilateral retrocrural and retroperitoneal adenopathy. 5. Age-indeterminate superior endplate fracture deformity is identified at the T12 level with mild loss of the superior endplate, new from previous lumbar spine MRI dated 12/03/2022. 6. Small right pleural effusion. 7. Patchy airspace densities identified within the posterior lung bases. 8. Aortic Atherosclerosis (ICD10-I70.0) and Emphysema (ICD10-J43.9). Electronically Signed   By: Signa Kell M.D.   On: 12/07/2023 05:24   DG CHEST PORT 1 VIEW Result Date: 12/05/2023 CLINICAL DATA:  [id number] SOB (shortness of breath) [id number] EXAM: PORTABLE CHEST 1 VIEW COMPARISON:  Chest radiograph from one day prior. FINDINGS: Loop recorder overlies the medial left chest. Stable cardiomediastinal silhouette with normal heart size. No pneumothorax. No pleural effusion. Mild hazy patchy opacities throughout  the right lung, most prominent in the lower right lung, similar. IMPRESSION: Mild hazy patchy opacities throughout the right lung, most prominent in the lower right lung, similar, compatible with multifocal  pneumonia. Electronically Signed   By: Delbert Phenix M.D.   On: 12/05/2023 09:56   DG CHEST PORT 1 VIEW Result Date: 12/04/2023 CLINICAL DATA:  82 year old male with shortness of breath. EXAM: PORTABLE CHEST 1 VIEW COMPARISON:  Portable chest yesterday and earlier. FINDINGS: Portable AP semi upright view at 0511 hours. Emphysema with asymmetric pulmonary interstitial opacity on CT 11/30/2023 suspected to be interstitial edema. Stable lung volumes and mediastinal contours from yesterday. Left chest wall loop recorder or ICD. Stable ventilation from yesterday. Unresolved basilar predominant interstitial opacity. No pneumothorax, pleural effusion or consolidation identified. IMPRESSION: Ongoing stable ventilation. Emphysema with persistent basilar predominant interstitial opacity favored to be interstitial edema. Electronically Signed   By: Odessa Fleming M.D.   On: 12/04/2023 08:36   DG CHEST PORT 1 VIEW Result Date: 12/03/2023 CLINICAL DATA:  [id number] with shortness of breath. EXAM: PORTABLE CHEST 1 VIEW COMPARISON:  Portable chest yesterday at 9:07 a.m. FINDINGS: 5:05 a.m. Stable mild cardiomegaly, mild central vascular prominence. Interstitial and patchy hazy opacities continue to be noted in the lower lung fields consistent with edema, pneumonia or combination. Small pleural effusions appear similar. The upper lung fields remain generally clear. Stable mediastinum with aortic tortuosity and atherosclerosis. No new osseous findings. Compare: Overall aeration seems unchanged. Left chest loop recorder device. IMPRESSION: 1. No significant change in aeration since yesterday. 2. Interstitial and patchy hazy opacities continue to be noted in the lower lung fields consistent with edema, pneumonia or combination. 3. Small pleural effusions. Electronically Signed   By: Almira Bar M.D.   On: 12/03/2023 08:02   DG CHEST PORT 1 VIEW Result Date: 12/02/2023 CLINICAL DATA:  Shortness of breath EXAM: PORTABLE CHEST 1 VIEW  COMPARISON:  10/31/2023, 11/30/2023 FINDINGS: Left chest wall loop recorder. Stable heart size. Aortic atherosclerosis. Increased interstitial markings with progressive streaky right basilar opacity. No pleural effusion or pneumothorax. IMPRESSION: Increased interstitial markings with progressive streaky right basilar opacity, suspicious for developing pneumonia. Electronically Signed   By: Duanne Guess D.O.   On: 12/02/2023 13:10   CT Chest Wo Contrast Result Date: 11/30/2023 CLINICAL DATA:  Recurrent falls 2 days ago. Left arm and leg weakness for the past 3 days. Clinical concern for pneumonia. EXAM: CT CHEST WITHOUT CONTRAST TECHNIQUE: Multidetector CT imaging of the chest was performed following the standard protocol without IV contrast. RADIATION DOSE REDUCTION: This exam was performed according to the departmental dose-optimization program which includes automated exposure control, adjustment of the mA and/or kV according to patient size and/or use of iterative reconstruction technique. COMPARISON:  Chest radiographs dated 10/31/2023. Chest CT dated 11/26/2020. FINDINGS: Cardiovascular: Atheromatous calcifications, including the coronary arteries and aorta. Enlarged heart. Minimal pericardial fluid. Mildly enlarged central pulmonary arteries with a main pulmonary artery diameter of 3.2 cm. Mediastinum/Nodes: Interval multiple enlarged mediastinal nodes with the largest in the right precarinal region with a short axis diameter of 20 mm on image number 45/301. No grossly enlarged hilar nodes, difficult to assess with the lack of intravenous contrast. No enlarged axillary, supraclavicular or upper abdominal nodes. Unremarkable thyroid gland, trachea and esophagus. Lungs/Pleura: Increased prominence of the pulmonary vasculature and interstitial markings with mild right basilar and minimal left basilar dependent atelectasis. The atelectasis on the right is more patchy and somewhat confluent, compatible with  possible pneumonia. Minimal right  pleural effusion. Moderate centrilobular bullous changes. Small left lower lobe calcified granuloma. Small amount of varicose bronchiectasis and associated parenchymal densities in the anterior left upper lobe. Upper Abdomen: Small amount of free peritoneal fluid. The liver contours are mildly irregular with hypertrophy of the lateral segment of the left lobe and caudate lobe and a relatively small right lobe. The spleen remains lobulated and somewhat small. Musculoskeletal: Thoracic spine degenerative changes. IMPRESSION: 1. Mild congestive heart failure with a minimal right pleural effusion. 2. Mild right basilar and minimal left basilar dependent atelectasis. The atelectasis on the right is more patchy and somewhat confluent, suggesting possible pneumonia. 3. Interval mediastinal adenopathy, nonspecific. 4. Moderate centrilobular bullous emphysematous changes. 5. Small amount of varicose bronchiectasis and associated parenchymal densities in the anterior left upper lobe. 6. Mildly enlarged central pulmonary arteries, suggesting pulmonary arterial hypertension. 7. Small amount of free peritoneal fluid. 8. Suggesting cirrhosis of the liver. 9.  Calcific coronary artery and aortic atherosclerosis. Aortic Atherosclerosis (ICD10-I70.0) and Emphysema (ICD10-J43.9). Electronically Signed   By: Beckie Salts M.D.   On: 11/30/2023 12:11   CT Head Wo Contrast Result Date: 11/30/2023 CLINICAL DATA:  Polytrauma, blunt.  Recurrent falls 2 days ago. EXAM: CT HEAD WITHOUT CONTRAST TECHNIQUE: Contiguous axial images were obtained from the base of the skull through the vertex without intravenous contrast. RADIATION DOSE REDUCTION: This exam was performed according to the departmental dose-optimization program which includes automated exposure control, adjustment of the mA and/or kV according to patient size and/or use of iterative reconstruction technique. COMPARISON:  Head CT 08/04/2009.  MRI  brain 08/05/2009. FINDINGS: Brain: No acute hemorrhage. Cortical gray-white differentiation is preserved. Patchy hypoattenuation of the cerebral white matter, most consistent with mild chronic small-vessel disease. Prominence of the ventricles and sulci within normal limits for age. No extra-axial collection. Basilar cisterns are patent. Vascular: No hyperdense vessel or unexpected calcification. Skull: No calvarial fracture or suspicious bone lesion. Skull base is unremarkable. Sinuses/Orbits: No acute finding. Other: None. IMPRESSION: 1. No acute intracranial abnormality. 2. Mild chronic small-vessel disease. Electronically Signed   By: Orvan Falconer M.D.   On: 11/30/2023 11:39    Orders Placed This Encounter  Procedures   NM PET Image Initial (PI) Skull Base To Thigh    Standing Status:   Future    Expected Date:   12/28/2023    Expiration Date:   12/20/2024    If indicated for the ordered procedure, I authorize the administration of a radiopharmaceutical per Radiology protocol:   Yes    Preferred imaging location?:   [facility]   CBC with Differential/Platelet    Standing Status:   Future    Number of Occurrences:   1    Expiration Date:   12/20/2024   Comprehensive metabolic panel    Standing Status:   Future    Number of Occurrences:   1    Expiration Date:   12/20/2024   Lactate dehydrogenase    Standing Status:   Future    Number of Occurrences:   1    Expiration Date:   12/20/2024   CA 19.9    Standing Status:   Future    Number of Occurrences:   1    Expiration Date:   12/20/2024   Guardant 360    Standing Status:   Future    Number of Occurrences:   1    Expiration Date:   12/20/2024   [facility] (Access[facility] ONLY    Standing Status:  Future    Number of Occurrences:   1    Expiration Date:   12/20/2024    CODE STATUS:  Code Status History     Date Active Date Inactive Code Status Order ID Comments User Context   11/30/2023 1726 12/11/2023 2303 Full Code [id number]   Maryln Gottron, MD Inpatient   09/14/2023 2304 09/18/2023 1852 Full Code [id number]  Anselm Jungling, DO Inpatient   07/28/2022 1549 07/29/2022 1805 Full Code [id number]  Donalee Citrin, MD Inpatient   06/19/2021 1656 06/27/2021 0318 Full Code [id number]  Donalee Citrin, MD Inpatient   02/12/2021 0920 02/14/2021 1714 Full Code [id number]  Beatriz Stallion., PA-C Inpatient   04/05/2020 0935 04/07/2020 2051 Full Code [id number]  Colon Branch, MD Inpatient   01/21/2019 0813 01/21/2019 1324 Full Code [id number]  Kathleene Hazel, MD Inpatient    Questions for Most Recent Historical Code Status (Order [id number])     Question Answer   By: Consent: discussion documented in EHR            Future Appointments  Date Time Provider Department Center  12/24/2023  3:30 PM [facility] PET CT 1 [facility] [place]  01/04/2024 11:40 AM Renly Roots, MD [facility] None  01/18/2024  7:00 AM [facility] DEVICE REMOTES [facility]  02/08/2024 11:00 AM Wendall Stade, MD [facility]  02/19/2024 10:30 AM Joaquim Nam, MD [facility]  02/22/2024  7:00 AM [facility] DEVICE REMOTES [facility]  02/26/2024  1:40 PM Armbruster, Willaim Rayas, MD [facility]  03/28/2024  7:00 AM [facility] DEVICE REMOTES [facility]  05/02/2024  7:00 AM [facility] DEVICE REMOTES [facility]  06/06/2024  7:00 AM [facility] DEVICE REMOTES [facility]  07/11/2024  7:00 AM [facility] DEVICE REMOTES [facility]  08/15/2024  7:00 AM [facility] DEVICE REMOTES [facility]  09/19/2024  7:00 AM [facility] DEVICE REMOTES [facility]  10/24/2024  7:00 AM [facility] DEVICE REMOTES [facility]  11/24/2024  8:50 AM [facility] ANNUAL WELLNESS VISIT 1 [facility]  11/28/2024  7:00 AM [facility] DEVICE REMOTES [facility]     I spent a total of 60 minutes during this encounter with the patient including review of chart  and various tests results, discussions about plan of care and coordination of care plan.  This document was completed utilizing speech recognition software. Grammatical errors, random word insertions, pronoun errors, and incomplete sentences are an occasional consequence of this system due to software limitations, ambient noise, and hardware issues. Any formal questions or concerns about the content, text or information contained within the body of this dictation should be directly addressed to the provider for clarification.

## 2023-12-21 NOTE — Assessment & Plan Note (Signed)
Ongoing for at least three months, location above the heart and before the shoulder. Pain may be related to metastatic disease in the chest. -Continue to monitor and manage pain as needed.

## 2023-12-22 DIAGNOSIS — C259 Malignant neoplasm of pancreas, unspecified: Secondary | ICD-10-CM | POA: Diagnosis not present

## 2023-12-22 DIAGNOSIS — M25512 Pain in left shoulder: Secondary | ICD-10-CM | POA: Diagnosis not present

## 2023-12-22 LAB — CANCER ANTIGEN 19-9: CA 19-9: 20297 U/mL — ABNORMAL HIGH (ref 0–35)

## 2023-12-24 ENCOUNTER — Encounter (HOSPITAL_COMMUNITY)
Admission: RE | Admit: 2023-12-24 | Discharge: 2023-12-24 | Disposition: A | Discharge status: Home / Self Care | Payer: Medicare Other | Source: Ambulatory Visit | Attending: Oncology | Admitting: Oncology

## 2023-12-24 DIAGNOSIS — C259 Malignant neoplasm of pancreas, unspecified: Secondary | ICD-10-CM | POA: Insufficient documentation

## 2023-12-24 DIAGNOSIS — C786 Secondary malignant neoplasm of retroperitoneum and peritoneum: Secondary | ICD-10-CM | POA: Diagnosis not present

## 2023-12-24 DIAGNOSIS — C787 Secondary malignant neoplasm of liver and intrahepatic bile duct: Secondary | ICD-10-CM | POA: Diagnosis not present

## 2023-12-24 LAB — GLUCOSE, CAPILLARY: Glucose-Capillary: 151 mg/dL — ABNORMAL HIGH (ref 70–99)

## 2023-12-24 MED ORDER — FLUDEOXYGLUCOSE F - 18 (FDG) INJECTION
7.8700 | Freq: Once | INTRAVENOUS | Status: AC
  Administered 2023-12-24: 7.87 via INTRAVENOUS

## 2023-12-29 ENCOUNTER — Other Ambulatory Visit: Payer: Self-pay | Admitting: Oncology

## 2023-12-29 ENCOUNTER — Encounter (HOSPITAL_COMMUNITY): Payer: Self-pay

## 2023-12-29 DIAGNOSIS — C259 Malignant neoplasm of pancreas, unspecified: Secondary | ICD-10-CM

## 2023-12-29 DIAGNOSIS — M48062 Spinal stenosis, lumbar region with neurogenic claudication: Secondary | ICD-10-CM | POA: Diagnosis not present

## 2023-12-29 MED ORDER — ONDANSETRON HCL 8 MG PO TABS: 8.0000 mg | ORAL_TABLET | Freq: Three times a day (TID) | ORAL | 1 refills | Status: DC | PRN

## 2023-12-29 MED ORDER — PROCHLORPERAZINE MALEATE 10 MG PO TABS: 10.0000 mg | ORAL_TABLET | Freq: Four times a day (QID) | ORAL | 1 refills | Status: DC | PRN

## 2023-12-29 MED ORDER — LIDOCAINE-PRILOCAINE 2.5-2.5 % EX CREA: TOPICAL_CREAM | CUTANEOUS | 3 refills | Status: DC

## 2023-12-29 NOTE — Progress Notes (Signed)
 PD-L1 CPS of 30%.  Will plan for Keytruda.

## 2023-12-30 ENCOUNTER — Other Ambulatory Visit: Payer: Self-pay

## 2023-12-30 ENCOUNTER — Encounter (HOSPITAL_COMMUNITY): Payer: Self-pay | Admitting: Oncology

## 2023-12-30 DIAGNOSIS — L24A2 Irritant contact dermatitis due to fecal, urinary or dual incontinence: Secondary | ICD-10-CM | POA: Diagnosis not present

## 2023-12-31 ENCOUNTER — Encounter (HOSPITAL_COMMUNITY): Payer: Self-pay | Admitting: Oncology

## 2023-12-31 DIAGNOSIS — C259 Malignant neoplasm of pancreas, unspecified: Secondary | ICD-10-CM | POA: Diagnosis not present

## 2023-12-31 NOTE — Progress Notes (Signed)
 Pharmacist Chemotherapy Monitoring - Initial Assessment    Anticipated start date: 01/04/24   The following has been reviewed per standard work regarding the patient's treatment regimen: The patient's diagnosis, treatment plan and drug doses, and organ/hematologic function Lab orders and baseline tests specific to treatment regimen  The treatment plan start date, drug sequencing, and pre-medications Prior authorization status  Patient's documented medication list, including drug-drug interaction screen and prescriptions for anti-emetics and supportive care specific to the treatment regimen The drug concentrations, fluid compatibility, administration routes, and timing of the medications to be used The patient's access for treatment and lifetime cumulative dose history, if applicable  The patient's medication allergies and previous infusion related reactions, if applicable   Changes made to treatment plan:  N/A  Follow up needed:  N/A   Nathan Fernandez, RPH, 12/31/2023  3:25 PM

## 2024-01-04 ENCOUNTER — Encounter: Payer: Self-pay | Admitting: Oncology

## 2024-01-04 ENCOUNTER — Inpatient Hospital Stay: Payer: Medicare Other

## 2024-01-04 ENCOUNTER — Inpatient Hospital Stay: Payer: Medicare Other | Attending: Oncology | Admitting: Oncology

## 2024-01-04 VITALS — BP 114/72 | HR 72 | Temp 98.4°F | Resp 18 | Ht 69.0 in | Wt 150.8 lb

## 2024-01-04 DIAGNOSIS — C7802 Secondary malignant neoplasm of left lung: Secondary | ICD-10-CM | POA: Insufficient documentation

## 2024-01-04 DIAGNOSIS — Z86718 Personal history of other venous thrombosis and embolism: Secondary | ICD-10-CM | POA: Diagnosis not present

## 2024-01-04 DIAGNOSIS — Z87891 Personal history of nicotine dependence: Secondary | ICD-10-CM | POA: Insufficient documentation

## 2024-01-04 DIAGNOSIS — C787 Secondary malignant neoplasm of liver and intrahepatic bile duct: Secondary | ICD-10-CM | POA: Diagnosis not present

## 2024-01-04 DIAGNOSIS — C786 Secondary malignant neoplasm of retroperitoneum and peritoneum: Secondary | ICD-10-CM | POA: Insufficient documentation

## 2024-01-04 DIAGNOSIS — R0789 Other chest pain: Secondary | ICD-10-CM | POA: Diagnosis not present

## 2024-01-04 DIAGNOSIS — C7801 Secondary malignant neoplasm of right lung: Secondary | ICD-10-CM | POA: Insufficient documentation

## 2024-01-04 DIAGNOSIS — C259 Malignant neoplasm of pancreas, unspecified: Secondary | ICD-10-CM | POA: Diagnosis not present

## 2024-01-04 DIAGNOSIS — R978 Other abnormal tumor markers: Secondary | ICD-10-CM | POA: Diagnosis not present

## 2024-01-04 NOTE — Progress Notes (Signed)
 [facility]  ONCOLOGY CLINIC PROGRESS NOTE   Patient Care Team: Cleatus Arlyss RAMAN, MD as PCP - General (Family Medicine) Delford Maude BROCKS, MD as PCP - Cardiology (Cardiology) Delford Maude BROCKS, MD as Consulting Physician (Cardiology) Fate Morna SAILOR, [facility] (Inactive) as Pharmacist (Pharmacist) Green, Davina E, RN as [facility] Care Management Leslee Reusing, MD as Consulting Physician (Ophthalmology) Nori, Sari SQUIBB, RN as Oncology Nurse Navigator Ahnaf Caponi, Chinita, MD as Consulting Physician (Oncology)  PATIENT NAME: Nathan Fernandez   MR#: [id number] DOB: Sep 20, 1941  Date of visit: 01/04/2024   ASSESSMENT & PLAN:   EYOEL THROGMORTON is a 83 y.o. 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   Primary pancreatic adenocarcinoma (HCC) Newly diagnosed with metastasis to the liver, lungs, peritoneum and possibly bones. Discussed the aggressive nature of the disease and the limited treatment options. Explained that the goal of chemotherapy is to control the cancer from further spreading and getting bigger, but it is not curable at this stage. Discussed the potential side effects of chemotherapy and the need for the patient to be strong enough to tolerate it.  -Today I discussed staging, prognosis, plan of care, treatment options.  Reviewed NCCN guidelines.  -His ECOG performance status is 3; 2 at best.  It would be challenging to treat him with systemic chemotherapy, especially with aggressive regimens that are needed for pancreatic cancer.  Staging PET/CT on 12/24/2023 showed hypermetabolic pancreatic mass, liver metastasis, right supraclavicular, mediastinal, retrocrural  and periaortic retroperitoneal lymphadenopathy.  Hypermetabolic omental, mesenteric and peritoneal metastasis were also noted.  Scattered tiny lung nodules, likely below the size threshold for PET scan.  Numerous bony hypermetabolic metastases.  Foundation One testing showed PD-L1 TPS of 35%.  Microsatellite stable.  4 Muts/Mb.  No evidence of BRCA1 or BRCA2 mutation.  No other actionable mutations.  Since his performance status is not allowing systemic chemotherapy, proposed systemic immunotherapy with single agent pembrolizumab given PD-L1 positivity.  Patient is agreeable.  Awaiting insurance approval.  -He is currently at a nursing home receiving rehab.  We hope his strength and appetite improves and he will return to his baseline prior to recent hospitalization.  Depending on eligibility for pembrolizumab or not, we will schedule follow-up appointments in the next 1 to 2 weeks.  If he is not approved for pembrolizumab, if his performance status continues to decline, best supportive care would be the only appropriate option for him.  Chest wall pain Ongoing for at least three months, location above the heart and before the shoulder. Pain may be related to metastatic disease in the chest. -Continue to monitor and manage pain as needed.    I reviewed lab results and outside records for this visit and discussed relevant results with the patient. Diagnosis, plan of care and treatment options were also discussed in detail with the patient. Opportunity provided to ask questions and answers provided to his apparent satisfaction. Provided instructions to call our clinic with any problems, questions or concerns prior to return visit. I recommended to continue follow-up with PCP and sub-specialists. He verbalized understanding and agreed with the plan.   NCCN guidelines have been consulted in the planning of this patient's care.  I spent a total of 30  minutes during this encounter with the patient  including review of chart and various tests results, discussions about plan of care and coordination of care plan.   Chinita Patten, MD  01/04/2024 3:05 PM  [facility] - A DEPT OF 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  Current Treatment: Not a candidate for systemic chemotherapy because of poor performance status.  Awaiting approval for pembrolizumab since PD-L1 TPS is 35%.  INTERVAL HISTORY:    Discussed the use of AI scribe software for clinical note transcription with the patient, who gave verbal consent to proceed.   [name] is here today for repeat clinical assessment.   He remains currently in rehab. He reports changes in bowel movements and urination, with a sudden urge to urinate and bowel movements occurring if he does not urinate immediately. This has been ongoing for about three weeks and was initially attributed to medication for constipation. The patient also reports pain in the shoulder and neck, for which he is awaiting therapy. He is currently on oxygen, which was recently increased from one to two liters due to increased activity. Despite the extensive disease, the patient reports no nausea, vomiting, or pain, and is able to eat and move around with the help of a wheelchair.  I have reviewed the past medical history, past surgical history, social history and family history with the patient and they are unchanged from previous note.  HISTORY OF PRESENT ILLNESS:   ONCOLOGY HISTORY:  [facility] with complaints of multiple falls over the week prior, progressive fatigue and weakness in the left arm and leg.   Initial CT chest without contrast suggested findings consistent with community-acquired pneumonia.  He was found to be in acute hypoxic respiratory failure and was admitted to the hospital for further evaluation and management.   During the hospital stay, he later developed hematuria which led to CT abdomen and pelvis with and without IV contrast on 12/06/2023.  CT scan showed a low-attenuation lesion within the distal tail of the pancreas measuring 3.8 x 2.5 cm, abutting the posterior wall of the gastric fundus.  Lesion appeared to encase the splenic artery and vein.  Small filling defect within the splenic vein.  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  Findings were compatible with pancreatic adenocarcinoma.  Multifocal, ill-defined bilobar low-density liver lesions, compatible with metastatic disease.  Signs of peritoneal carcinomatosis identified with the peritoneal nodule identified within the anterior aspect of the right hemiabdomen.  Bilateral retrocrural and retroperitoneal adenopathy.   CA 19-9 was significantly elevated at 10,698 on 12/07/2023.   IR performed core needle biopsy of peritoneal mass on 12/08/2023.  Pathology showed moderately differentiated adenocarcinoma, morphologically consistent with a pancreatic primary.  Additional testing was not performed to preserve tissue for clinician directed testing.   On 12/09/2023, CT chest angiogram showed acute lobar and segmental right middle lobe and segmental and subsegmental multifocal right lower lobe pulmonary emboli.  No saddle emboli.  Numerous indistinct solid pulmonary nodules scattered throughout both lungs, largest measuring 0.9 cm in the left lower lobe.  This was consistent with pulmonary metastasis.  Mild mediastinal, bilateral retrocrural and right hilar adenopathy, likely  nodal metastasis.  Small sclerotic lesions in the manubrium and posterior T6 vertebral body,  suspect osseous metastasis.   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   On his initial consultation with us  on 12/21/2023, his ECOG performance status was low at 3, ECOG 2 at best.     Request submitted for staging PET/CT, Guardant360, foundation One testing on the specimen.    Staging PET/CT on 12/24/2023 showed hypermetabolic pancreatic mass, liver metastasis, right supraclavicular, mediastinal, retrocrural and periaortic retroperitoneal lymphadenopathy.  Hypermetabolic omental, mesenteric and peritoneal metastasis were also noted.  Scattered tiny lung nodules, likely below the size threshold for PET scan.  Numerous bony hypermetabolic metastases.  Foundation One testing showed PD-L1 TPS of 35%.  Microsatellite stable.  4 Muts/Mb.  No evidence of BRCA1 or BRCA2 mutation.  No other actionable mutations.  Since his performance status is not allowing systemic chemotherapy, proposed systemic immunotherapy with single agent pembrolizumab given PD-L1 positivity.  Awaiting insurance approval.  Oncology History  Primary pancreatic adenocarcinoma (HCC)  12/21/2023 Initial Diagnosis   Primary pancreatic adenocarcinoma (HCC)   12/21/2023 Cancer Staging   Staging form: Exocrine Pancreas, AJCC 8th Edition - Clinical stage from 12/21/2023: Stage IV (cTX, cNX, pM1) - Signed by Autumn Millman, MD on 12/21/2023   01/04/2024 -  Chemotherapy   Patient is on Treatment Plan : PANCREATIC MSI-H/DMMR Pembrolizumab (200) q21d         REVIEW OF SYSTEMS:   Review of Systems - Oncology  All other pertinent systems were reviewed with the patient and are negative.  ALLERGIES: He is allergic to ace inhibitors, angiotensin receptor blockers, vasotec  [enalapril ], and pravachol  [pravastatin ].  MEDICATIONS:  Current Outpatient Medications  Medication Sig Dispense Refill   acetaminophen  (TYLENOL ) 325 MG tablet Take 2 tablets (650 mg total) by mouth  every 6 (six) hours as needed for mild pain (pain score 1-3) (or Fever >/= 101).     albuterol  (VENTOLIN  HFA) 108 (90 Base) MCG/ACT inhaler Inhale 1-2 puffs into the lungs every 6 (six) hours as needed (COPD). 1 each 3   Ascorbic Acid  (VITAMIN C PO) Take 1 tablet by mouth daily.     aspirin  EC 81 MG tablet Take 81 mg by mouth daily. Swallow whole.     buPROPion  (WELLBUTRIN  SR) 150 MG 12 hr tablet Take 1 tablet (150 mg total) by mouth daily.     carvedilol  (COREG ) 6.25 MG tablet TAKE 1 TABLET BY MOUTH TWICE A DAY 180 tablet 1   Cyanocobalamin  (B-12 PO) Take 1 tablet by mouth daily.     cyclobenzaprine  (FLEXERIL ) 5 MG tablet Take 1 tablet (5 mg total) by mouth 3 (three) times daily as needed for muscle spasms.     diclofenac  Sodium (VOLTAREN ) 1 % GEL Apply topically 4 (four) times daily.     dorzolamide -timolol  (COSOPT ) 22.3-6.8 MG/ML ophthalmic solution Place 1 drop into both eyes 2 (two) times daily.     enoxaparin  (LOVENOX ) 80 MG/0.8ML injection Inject 0.65 mLs (65 mg total) into the skin 2 (two) times daily.     feeding supplement (ENSURE ENLIVE / ENSURE PLUS) LIQD Take 237 mLs by mouth 2 (two) times daily between meals. 237 mL 12   furosemide  (LASIX ) 40 MG tablet Take 1 tablet (40 mg total) by mouth daily.     gabapentin  (NEURONTIN ) 300 MG capsule Take 1 capsule (300 mg total) by mouth 3 (three) times daily.     hydrALAZINE  (APRESOLINE ) 10 MG tablet TAKE 1  TABLET BY MOUTH EVERY 12 HOURS. 180 tablet 3   HYDROcodone -acetaminophen  (NORCO/VICODIN) 5-325 MG tablet Take 1 tablet by mouth every 6 (six) hours as needed for moderate pain (pain score 4-6).     hydrocortisone  cream 0.5 % Apply topically 2 (two) times daily as needed for itching. 30 g 0   isosorbide  mononitrate (IMDUR ) 30 MG 24 hr tablet TAKE 1/2 TABLET BY MOUTH DAILY 45 tablet 3   lidocaine  (XYLOCAINE ) 5 % ointment Apply 1 Application topically as needed. 35.44 g 0   polyethylene glycol powder (GLYCOLAX /MIRALAX ) 17 GM/SCOOP powder Take 17  g by mouth daily as needed.     rosuvastatin  (CRESTOR ) 20 MG tablet TAKE 1 TABLET BY MOUTH EVERY DAY 90 tablet 1   spironolactone  (ALDACTONE ) 25 MG tablet Take 0.5 tablets (12.5 mg total) by mouth daily.     traZODone  (DESYREL ) 100 MG tablet Take 1-2 tablets (100-200 mg total) by mouth at bedtime. (Patient taking differently: Take 100 mg by mouth at bedtime.)     B Complex CAPS Take 1 capsule by mouth daily. (Patient not taking: Reported on 12/21/2023)     B Complex-C (B-COMPLEX WITH VITAMIN C) tablet Take 1 tablet by mouth daily. (Patient not taking: Reported on 01/04/2024)     Cholecalciferol  (VITAMIN D -3 PO) Take 1 capsule by mouth daily. (Patient not taking: Reported on 01/04/2024)     ELDERBERRY PO Take 1 capsule by mouth in the morning and at bedtime. (Patient not taking: Reported on 12/21/2023)     fenofibrate  (TRICOR ) 48 MG tablet TAKE 1 TABLET BY MOUTH EVERY DAY (Patient not taking: Reported on 12/21/2023) 90 tablet 0   Glucosamine-Chondroitin (GLUCOSAMINE CHONDR COMPLEX PO) Take 1 tablet by mouth daily. (Patient not taking: Reported on 12/21/2023)     hydrocortisone  (ANUSOL -HC) 25 MG suppository Place 1 suppository (25 mg total) rectally 2 (two) times daily. (Patient not taking: Reported on 01/04/2024) 12 suppository 0   ketoconazole  (NIZORAL ) 2 % cream APPLY 1 APPLICATION TOPICALLY DAILY AS NEEDED FOR IRRITATION. (Patient not taking: Reported on 12/21/2023) 30 g 2   lidocaine -prilocaine  (EMLA ) cream Apply to affected area once (Patient not taking: Reported on 01/04/2024) 30 g 3   nitroGLYCERIN  (NITROSTAT ) 0.4 MG SL tablet Place 1 tablet (0.4 mg total) under the tongue every 5 (five) minutes as needed for chest pain. Up to 3 doses (Patient not taking: Reported on 01/04/2024) 25 tablet 3   ondansetron  (ZOFRAN ) 4 MG tablet Take 1 tablet (4 mg total) by mouth every 6 (six) hours as needed for nausea. (Patient not taking: Reported on 01/04/2024) 20 tablet 0   ondansetron  (ZOFRAN ) 8 MG tablet Take 1  tablet (8 mg total) by mouth every 8 (eight) hours as needed for nausea or vomiting. (Patient not taking: Reported on 01/04/2024) 30 tablet 1   prochlorperazine  (COMPAZINE ) 10 MG tablet Take 1 tablet (10 mg total) by mouth every 6 (six) hours as needed for nausea or vomiting. (Patient not taking: Reported on 01/04/2024) 30 tablet 1   traMADol  (ULTRAM ) 50 MG tablet Take 50 mg by mouth every 6 (six) hours as needed (Per MAR from [facility]). (Patient not taking: Reported on 01/04/2024)     VITAMIN D  PO Take 1 tablet by mouth daily. (Patient not taking: Reported on 12/21/2023)     No current facility-administered medications for this visit.     VITALS:   Blood pressure 114/72, pulse 72, temperature 98.4 F (36.9 C), temperature source Temporal, resp. rate 18, height 5' 9 (1.753  m), weight 150 lb 12.8 oz (68.4 kg), SpO2 99%.  Wt Readings from Last 3 Encounters:  01/04/24 150 lb 12.8 oz (68.4 kg)  12/21/23 150 lb 9.6 oz (68.3 kg)  12/09/23 149 lb 0.5 oz (67.6 kg)    Body mass index is 22.27 kg/m.  Performance status (ECOG): 3 - Symptomatic, >50% confined to bed  PHYSICAL EXAM:   Physical Exam Constitutional:      General: He is not in acute distress.    Comments: Frail-appearing.  Presented to clinic in a wheelchair  HENT:     Head: Normocephalic and atraumatic.  Eyes:     General: No scleral icterus.    Conjunctiva/sclera: Conjunctivae normal.  Cardiovascular:     Rate and Rhythm: Normal rate and regular rhythm.     Heart sounds: Normal heart sounds.  Pulmonary:     Effort: Pulmonary effort is normal.     Comments: Wearing O2 via nasal cannula Abdominal:     General: There is no distension.  Musculoskeletal:     Right lower leg: No edema.     Left lower leg: No edema.  Neurological:     Mental Status: He is alert and oriented to person, place, and time.  Psychiatric:        Mood and Affect: Mood normal.        Behavior: Behavior normal.      LABORATORY  DATA:   I have reviewed the data as listed.  No results found for any visits on 01/04/24.  Lab Results  Component Value Date   WBC 10.5 12/21/2023   NEUTROABS 7.8 (H) 12/21/2023   HGB 11.1 (L) 12/21/2023   HCT 34.0 (L) 12/21/2023   MCV 95.2 12/21/2023   PLT 335 12/21/2023       Component Value Date/Time   NA 139 12/21/2023 1558   NA 140 09/25/2023 1604   K 5.3 (H) 12/21/2023 1558   CL 101 12/21/2023 1558   CO2 30 12/21/2023 1558   GLUCOSE 154 (H) 12/21/2023 1558   BUN 37 (H) 12/21/2023 1558   BUN 28 (H) 09/25/2023 1604   CREATININE 1.22 12/21/2023 1558   CREATININE 0.95 04/16/2020 1229   CALCIUM  9.4 12/21/2023 1558   PROT 6.4 (L) 12/21/2023 1558   PROT 5.6 (L) 07/22/2023 1117   ALBUMIN 3.3 (L) 12/21/2023 1558   ALBUMIN 3.5 (L) 07/22/2023 1117   AST 24 12/21/2023 1558   AST 14 (L) 04/16/2020 1229   ALT 15 12/21/2023 1558   ALT 11 04/16/2020 1229   ALKPHOS 92 12/21/2023 1558   BILITOT 0.4 12/21/2023 1558   BILITOT 0.4 07/22/2023 1117   BILITOT 0.5 04/16/2020 1229   GFRNONAA 59 (L) 12/21/2023 1558   GFRNONAA >60 04/16/2020 1229   GFRAA 79 01/09/2021 1634   GFRAA >60 04/16/2020 1229     RADIOGRAPHIC STUDIES:  I have personally reviewed the radiological images as listed and agree with the findings in the report.  NM PET Image Initial (PI) Skull Base To Thigh Result Date: 01/01/2024 CLINICAL DATA:  Initial treatment strategy for pancreatic adenocarcinoma. EXAM: NUCLEAR MEDICINE PET SKULL BASE TO THIGH TECHNIQUE: 7.9 mCi F-18 FDG was injected intravenously. Full-ring PET imaging was performed from the skull base to thigh after the radiotracer. CT data was obtained and used for attenuation correction and anatomic localization. Fasting blood glucose: 151 mg/dl COMPARISON:  Chest CT 87/81/7975.  Abdomen and pelvis CT 12/06/2023 FINDINGS: Mediastinal blood pool activity: SUV max 2.9 Liver activity: SUV max NA NECK:  12 mm short axis right supraclavicular node on 51/4 is  hypermetabolic with SUV max = 4.3. Incidental CT findings: None. CHEST: Hypermetabolic mediastinal lymphadenopathy. Index low right paratracheal node on 73/4 measures 2 cm short axis with SUV max = 7.9. No hypermetabolic axillary lymphadenopathy. Scattered tiny pulmonary nodules, better demonstrated on recent chest CT again noted. Most of these show no hypermetabolism although they are below accepted size threshold for reliable resolution on PET imaging. Some of the more dominant nodules are in the posterior right costophrenic sulcus in show no demonstrable hypermetabolism on PET imaging. Incidental CT findings: Coronary artery calcification is evident. Moderate atherosclerotic calcification is noted in the wall of the thoracic aorta. ABDOMEN/PELVIS: The patient's known pancreatic mass is hypermetabolic with SUV max = 6.5. Hypermetabolic liver metastases noted including dominant segment II/III lesion described previously with SUV max = 5.9 today. Hypermetabolic retrocrural lymph nodes are evident. 9 mm short axis portal caval lymph node is hypermetabolic with SUV max = 5.3. Hypermetabolic lymph nodes are seen in the periaortic retroperitoneal space. 2.7 cm right omental lesion on 143/4 is hypermetabolic with SUV max = 5.6. Additional foci of hypermetabolic uptake are seen in the omentum, mesentery, and along the peritoneal surface. Incidental CT findings: Small to moderate volume ascites. There is moderate atherosclerotic calcification of the abdominal aorta without aneurysm. SKELETON: Numerous bony hypermetabolic metastases. Destructive lesion in the lower cervical spine appears to involve the C6 level although without sagittal imaging, confident assessment of the level is not possible. There is destruction of the pedicle, lateral vertebral body, and facets. Tumor involvement of the neural foramen is suspected. SUV max = 6.3. Scattered hypermetabolic lesions are seen in the scapula, ribs, thoracic spine, lumbar  spine, bony pelvis and proximal femora. Index lesion right acetabular roof demonstrates SUV max = 8.3. Incidental CT findings: Degenerative changes are noted in the lumbar spine. IMPRESSION: 1. The patient's known pancreatic mass is hypermetabolic. 2. Hypermetabolic liver metastases. 3. Hypermetabolic right supraclavicular, mediastinal, retrocrural, and periaortic retroperitoneal lymphadenopathy. 4. Hypermetabolic omental, mesenteric, and peritoneal metastases. 5. Small to moderate volume ascites. 6. Scattered tiny pulmonary nodules, better demonstrated on recent chest CT. Most of these show no hypermetabolism although they are below accepted size threshold for reliable resolution on PET imaging. Some of the more dominant nodules are in the posterior right costophrenic sulcus in show no demonstrable hypermetabolism on PET imaging. 7. Numerous bony hypermetabolic metastases. Destructive lesion in the lower cervical spine appears to involve the C6 level although without sagittal imaging, confident assessment of the level is not possible. There is destruction of the pedicle, lateral vertebral body, and facets at this level. Tumor involvement of the neural foramen is suspected. Cervical spine MRI with and without contrast could be used to further evaluate as clinically warranted. 8.  Aortic Atherosclerosis (ICD10-I70.0). Electronically Signed   By: Camellia Candle M.D.   On: 01/01/2024 05:31   CUP PACEART REMOTE DEVICE CHECK Result Date: 12/14/2023 ILR summary report received. Battery status OK. Normal device function. No new symptom, tachy, brady, or pause episodes. No new AF episodes. Monthly summary reports and ROV/PRN KS, CVRS  DG CHEST PORT 1 VIEW Result Date: 12/11/2023 CLINICAL DATA:  Shortness of breath. EXAM: PORTABLE CHEST 1 VIEW COMPARISON:  December 05, 2023. FINDINGS: The heart size and mediastinal contours are within normal limits. Increased interstitial densities are noted throughout both lungs  concerning for edema or atypical infection. The visualized skeletal structures are unremarkable. IMPRESSION: Increased interstitial densities are noted bilaterally as described  above. Electronically Signed   By: Lynwood Landy Raddle M.D.   On: 12/11/2023 08:36   VAS US  LOWER EXTREMITY VENOUS (DVT) Result Date: 12/09/2023  Lower Venous DVT Study Patient Name:  ARGEL PABLO  Date of Exam:   12/09/2023 Medical Rec #: [id number]      Accession #:    [id number] Date of Birth: 1940/12/27      Patient Gender: M Patient Age:   38 years Exam Location:  [facility] Procedure:      VAS US  LOWER EXTREMITY VENOUS (DVT) Referring Phys: BURGESS DARE --------------------------------------------------------------------------------  Indications: Pulmonary embolism.  Risk Factors: DVT HX of LLE DVT. Anticoagulation: Xarelto  prior and during hospital stay. Now on lovenox  due to PE on Xarelto . Comparison Study: Previous exam on 05/09/2022 was negative for DVT Performing Technologist: Ezzie Potters RVT, RDMS  Examination Guidelines: A complete evaluation includes B-mode imaging, spectral Doppler, color Doppler, and power Doppler as needed of all accessible portions of each vessel. Bilateral testing is considered an integral part of a complete examination. Limited examinations for reoccurring indications may be performed as noted. The reflux portion of the exam is performed with the patient in reverse Trendelenburg.  +---------+---------------+---------+-----------+----------+--------------+ RIGHT    CompressibilityPhasicitySpontaneityPropertiesThrombus Aging +---------+---------------+---------+-----------+----------+--------------+ CFV      Full           Yes      Yes                                 +---------+---------------+---------+-----------+----------+--------------+ SFJ      Full                                                        +---------+---------------+---------+-----------+----------+--------------+  FV Prox  Full           Yes      Yes                                 +---------+---------------+---------+-----------+----------+--------------+ FV Mid   Full           Yes      Yes                                 +---------+---------------+---------+-----------+----------+--------------+ FV DistalFull           Yes      Yes                                 +---------+---------------+---------+-----------+----------+--------------+ PFV      Full                                                        +---------+---------------+---------+-----------+----------+--------------+ POP      Partial        No       Yes                  Acute          +---------+---------------+---------+-----------+----------+--------------+  PTV      Partial        No       No                   Acute          +---------+---------------+---------+-----------+----------+--------------+ PERO     None           No       No                   Acute          +---------+---------------+---------+-----------+----------+--------------+   +---------+---------------+---------+-----------+----------+--------------+ LEFT     CompressibilityPhasicitySpontaneityPropertiesThrombus Aging +---------+---------------+---------+-----------+----------+--------------+ CFV      Full           Yes      Yes                                 +---------+---------------+---------+-----------+----------+--------------+ SFJ      Full                                                        +---------+---------------+---------+-----------+----------+--------------+ FV Prox  Full           Yes      Yes                                 +---------+---------------+---------+-----------+----------+--------------+ FV Mid   Full           Yes      Yes                                 +---------+---------------+---------+-----------+----------+--------------+ FV DistalFull           Yes      Yes                                  +---------+---------------+---------+-----------+----------+--------------+ PFV      Full                                                        +---------+---------------+---------+-----------+----------+--------------+ POP      Full           Yes      Yes                                 +---------+---------------+---------+-----------+----------+--------------+ PTV      Full                                                        +---------+---------------+---------+-----------+----------+--------------+ Soleal   Full                                                        +---------+---------------+---------+-----------+----------+--------------+  Summary: RIGHT: - Findings consistent with acute deep vein thrombosis involving the right peroneal veins, and right popliteal vein.   LEFT: - There is no evidence of deep vein thrombosis in the lower extremity.  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 Inflow velocity 11 cm/s. Outflow 13 cm/s. Collateral seen proximal to aneurysm. Seen on previous ultrasound from 05/09/2022. Distal ATA, PTA, and PeroA are all patent at the level of the ankle.  *See table(s) above for measurements and observations. Electronically signed by Debby Robertson on 12/09/2023 at 4:06:36 PM.    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 Chest staging. * Tracking Code: BO * EXAM: CT CHEST WITH CONTRAST TECHNIQUE: Multidetector CT imaging of the chest was performed during intravenous contrast administration. RADIATION DOSE REDUCTION: This exam was performed according to the departmental dose-optimization program which includes automated exposure control, adjustment of the mA and/or kV according to patient size and/or use of iterative  reconstruction technique. CONTRAST:  75mL OMNIPAQUE  IOHEXOL  300 MG/ML  SOLN COMPARISON:  12/06/2023 CT abdomen/pelvis.  11/30/2023 chest CT. FINDINGS: Cardiovascular: Normal heart size. No significant pericardial effusion/thickening. Three-vessel coronary atherosclerosis. Atherosclerotic nonaneurysmal thoracic aorta. Normal caliber pulmonary arteries. Acute lobar and segmental right middle lobe (series 2/image 91) and segmental and subsegmental multifocal right lower lobe (series 2/images 96 and 109) pulmonary emboli. No saddle emboli. Mediastinum/Nodes: No significant thyroid  nodules. Unremarkable esophagus. No axillary adenopathy. Bilateral paratracheal adenopathy up to 2.3 cm on the right (series 2/image 64), unchanged from recent 11/30/2023 chest CT. Stable mildly enlarged 1.2 cm subcarinal node (series 2/image 76). Stable mildly enlarged bilateral retrocrural nodes, for example 1.3 cm on the right (series 2/image 158), unchanged. Mildly enlarged 1.1 cm right hilar node (series 2/image 88), unchanged. No discrete left hilar adenopathy. Lungs/Pleura: No pneumothorax. Trace posterior right pleural effusion is unchanged. No left pleural effusion. Severe centrilobular emphysema with diffuse bronchial wall thickening. Patchy consolidation in the posterior right lower lobe is similar. Numerous (greater than 10) indistinct solid pulmonary nodules scattered throughout both lungs, not substantially changed from recent chest CT, for example 0.9 cm in the posterior left lower lobe (series 8/image 99) and 0.8 cm in the anterior left upper lobe (series 8/image 68), new from more remote 11/26/2020 screening chest CT. Upper abdomen: Small volume upper abdominal ascites, unchanged from recent CT abdomen study. Several indistinct hypodense liver masses scattered throughout the visualized liver up to 2.5 cm in the lateral segment left liver on series 2/image 149, not substantially changed from recent CT abdomen study. Limited  visualization of known pancreatic tail mass. Musculoskeletal: Small sclerotic lesions in the manubrium (series 7/image 90) and posterior T6 vertebral body (series 7/image 91), new from 11/26/2020 CT. Mild superior T12 vertebral compression fracture, unchanged from 11/30/2023 chest CT. Marked thoracic spondylosis. IMPRESSION: 1. Acute lobar and segmental right middle lobe and segmental and subsegmental multifocal right lower lobe pulmonary emboli. No saddle emboli. 2. Numerous (greater than 10) indistinct solid pulmonary nodules scattered throughout both lungs, largest 0.9 cm in the left lower lobe, not substantially changed from recent 11/30/2023 chest CT, new from more remote 11/26/2020 screening chest CT, compatible with pulmonary metastases. 3. Stable mild mediastinal, bilateral retrocrural and right hilar adenopathy since 11/30/2023 chest CT, likely nodal metastases. 4. Small sclerotic lesions in the manubrium and posterior T6 vertebral body, new from  11/26/2020 CT, suspect osseous metastases. 5. Trace posterior right pleural effusion, unchanged. Patchy consolidation in the posterior right lower lobe is similar, which could represent atelectasis or mild pneumonia. 6. Please see the recent CT abdomen/pelvis report from 12/06/2023 for details regarding the upper abdominal findings. 7. Three-vessel coronary atherosclerosis. 8. Aortic Atherosclerosis (ICD10-I70.0) and Emphysema (ICD10-J43.9). Critical Value/emergent results were called by telephone at the time of interpretation on 12/09/2023 at 8:53 am to provider [facility] , who verbally acknowledged these results. Electronically Signed   By: Selinda DELENA Blue M.D.   On: 12/09/2023 08:57   CT BIOPSY Result Date: 12/08/2023 INDICATION: 83 year old male with abdominal mass referred for biopsy EXAM: CT BIOPSY MEDICATIONS: None. ANESTHESIA/SEDATION: Moderate (conscious) sedation was not employed during this procedure. A total of Versed  0 mg and Fentanyl  25 mcg was  administered intravenously. Moderate Sedation Time: 0 minutes. The patient's level of consciousness and vital signs were monitored continuously by radiology nursing throughout the procedure under my direct supervision. FLUOROSCOPY TIME:  CT COMPLICATIONS: None PROCEDURE: Informed written consent was obtained from the patient after a thorough discussion of the procedural risks, benefits and alternatives. All questions were addressed. Maximal Sterile Barrier Technique was utilized including caps, mask, sterile gowns, sterile gloves, sterile drape, hand hygiene and skin antiseptic. A timeout was performed prior to the initiation of the procedure. Patient was positioned supine on the CT gantry table. Scout CT acquired for planning purposes. Using CT guidance, a guide needle was advanced into the mass of the right abdomen. Once we confirmed needle tip position, multiple 18 gauge core biopsy were acquired. Samples placed into formalin. Needle was removed and a final CT was acquired. Sterile bandage was applied. Patient tolerated the procedure well and remained hemodynamically stable throughout. No complications were encountered and no significant blood loss. IMPRESSION: Status post CT-guided biopsy of right peritoneal abdominal mass Signed, Ami RAMAN. Alona ROSALEA GRAVER, RPVI [facility] Electronically Signed   By: Ami Alona D.O.   On: 12/08/2023 12:42   CT HEMATURIA WORKUP Result Date: 12/07/2023 CLINICAL DATA:  Hematuria. * Tracking Code: BO * EXAM: CT ABDOMEN AND PELVIS WITHOUT AND WITH CONTRAST TECHNIQUE: Multidetector CT imaging of the abdomen and pelvis was performed following the standard protocol before and following the bolus administration of intravenous contrast. RADIATION DOSE REDUCTION: This exam was performed according to the departmental dose-optimization program which includes automated exposure control, adjustment of the mA and/or kV according  to patient size and/or use of iterative reconstruction technique. CONTRAST:  OMNIPAQUE  IOHEXOL  300 MG/ML  SOLN COMPARISON:  CT chest 11/30/2023 and CT AP 04/04/2020 FINDINGS: Lower chest: Small right pleural effusion identified. Emphysema. Patchy airspace densities identified within the posterior lung bases. Hepatobiliary: There are multifocal, ill-defined bilobar low-density liver lesions. These are new when compared with the study from 04/04/2020 this includes: -Segment 2/3 lesion measures 2.5 x 2.1 cm, image 19/4. -Segment 8 lesion measures 1.0 x 1.1 cm, image 14/4. -Segment 4B lesion measures 1.7 x 1.4 cm, image 24/4. Gallbladder appears normal. Common bile duct measures 1 cm, image 45/9. Formally 0.9 cm. No common bile duct stone or obstructing mass identified. Pancreas: Within the distal tail of pancreas there is a low-attenuation lesion which measures 3.8 x 2.5 cm, image 23/4. This abuts the posterior wall of the gastric fundus, image 23/4. This also appears to encase the splenic artery and vein. Small filling defect within the splenic vein posterior to this lesion is noted, image 42/9. 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 There does not appear to be involvement of the celiac trunk or SMA. Spleen: Solitary low-density lesion within the periphery of the upper pole of spleen measures 1.8 cm, image 10/4. 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 Adrenals/Urinary Tract: Normal adrenal glands. No nephrolithiasis or signs of obstructive uropathy. Left renal cortical scarring. Exophytic fluid density cyst off the lateral cortex of the left kidney measures 1.6 cm and is compatible with a Bosniak class 1 cyst, image 27/4. No follow-up imaging recommended. Urinary bladder is unremarkable. Stomach/Bowel: Stomach is nondistended. No pathologic dilatation of the large or small  bowel loops. The appendix is visualized and appears normal. Vascular/Lymphatic: Aortic atherosclerosis.  No aneurysm. Prominent retrocrural lymph nodes are identified bilaterally. Right retrocrural node measures 1.2 cm, image 12/4. Bilateral retroperitoneal adenopathy. Preaortic lymph node measures 1.3 cm, image 33/4. left retroperitoneal lymph node measures 1.4 cm, image 33/4. Retrocaval node measures 1.3 cm, image 31/4. Reproductive: Prostate gland enlargement. Other: Signs of peritoneal carcinomatosis identified. Small volume of perihepatic and pelvic ascites noted. Within the anterior right hemiabdomen there is a peritoneal nodule measuring 2.5 x 2.7 cm, image 37/4. Loculated fluid with surrounding enhancement in the central pelvis measures 3.2 x 1.8 cm, image 62/4. Within the central pelvis Musculoskeletal: Lumbar scoliosis and multilevel degenerative disc disease. Status post posterior decompression of the lumbar spine. Age-indeterminate superior endplate fracture deformity is identified at the T12 level with mild loss of the superior endplate, new from previous lumbar spine MRI dated 12/03/2022. IMPRESSION: 1. There is a low-attenuation lesion within the distal tail of pancreas which measures 3.8 x 2.5 cm. This abuts the posterior wall of the gastric fundus. This also appears to encase the splenic artery and vein. Small filling defect within the splenic vein posterior to this lesion is noted. 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 2. Multifocal, ill-defined bilobar low-density liver lesions are new when compared with the study from 04/04/2020. 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 3. Signs of peritoneal carcinomatosis identified. Small volume of perihepatic and pelvic ascites noted. Loculated fluid with surrounding enhancement in the central pelvis measures 3.2 x 1.8  cm. Peritoneal nodule identified within the anterior aspect of the right hemiabdomen 4. Bilateral retrocrural and retroperitoneal adenopathy. 5. Age-indeterminate superior endplate fracture deformity is identified at the T12 level with mild loss of the superior endplate, new from previous lumbar spine MRI dated 12/03/2022. 6. Small right pleural effusion. 7. Patchy airspace densities identified within the posterior lung bases. 8. Aortic Atherosclerosis (ICD10-I70.0) and Emphysema (ICD10-J43.9). Electronically Signed   By: Waddell Calk M.D.   On: 12/07/2023 05:24    CODE STATUS:  Code Status History     Date Active Date Inactive Code Status Order ID Comments User Context   11/30/2023 1726 12/11/2023 2303 Full Code [id number]  Zella Katha HERO, MD Inpatient   09/14/2023 2304 09/18/2023 1852 Full Code [id number]  Ricky Alfrieda DASEN, DO Inpatient   07/28/2022 1549 07/29/2022 1805 Full Code [id number]  Onetha Kuba, MD Inpatient   06/19/2021 1656 06/27/2021 0318 Full Code [id number]  Onetha Kuba, MD Inpatient   02/12/2021 0920 02/14/2021 1714 Full Code [id number]  Dock Bruno HERO., PA-C Inpatient   04/05/2020 0935 04/07/2020 2051 Full Code [id number]  Zella Katha Stakes, MD Inpatient   01/21/2019 0813 01/21/2019 1324 Full Code [id number]  Verlin Lonni BIRCH, MD Inpatient    Questions for Most Recent Historical Code Status (Order [id number])     Question Answer   By: Consent: discussion documented in EHR            No orders of the defined types were placed in this encounter.    Future Appointments  Date Time Provider Department Center  01/18/2024  7:00 AM [facility] DEVICE REMOTES [facility]  02/08/2024 11:00 AM Delford Maude BROCKS, MD [facility]  02/19/2024 10:30 AM Cleatus Arlyss RAMAN, MD [facility]  02/22/2024  7:00 AM [facility] DEVICE REMOTES [facility]  02/26/2024  1:40 PM Armbruster, Elspeth SQUIBB, MD [facility]  03/28/2024  7:00 AM [facility] DEVICE REMOTES  [facility]  05/02/2024  7:00 AM [facility] DEVICE REMOTES [facility]  06/06/2024  7:00 AM [facility] DEVICE REMOTES [facility]  07/11/2024  7:00 AM [facility] DEVICE REMOTES [facility]  08/15/2024  7:00 AM [facility] DEVICE REMOTES [facility]  09/19/2024  7:00 AM [facility] DEVICE REMOTES [facility]  10/24/2024  7:00 AM [facility] DEVICE REMOTES [facility]  11/24/2024  8:50 AM [facility] ANNUAL WELLNESS VISIT 1 [facility]  11/28/2024  7:00 AM [facility] DEVICE REMOTES [facility]      This document was completed utilizing speech recognition software. Grammatical errors, random word insertions, pronoun errors, and incomplete sentences are an occasional consequence of this system due to software limitations, ambient noise, and hardware issues. Any formal questions or concerns about the content, text or information contained within the body of this dictation should be directly addressed to the provider for clarification.

## 2024-01-04 NOTE — Assessment & Plan Note (Signed)
 Ongoing for at least three months, location above the heart and before the shoulder. Pain may be related to metastatic disease in the chest. -Continue to monitor and manage pain as needed.

## 2024-01-04 NOTE — Progress Notes (Signed)
 PATIENT NAVIGATOR PROGRESS NOTE  Name: Nathan Fernandez Date: 01/04/2024 MRN: [id number]  DOB: 27-Oct-1941   Reason for visit:  F/U for treatment discussion  Comments:  See education note    Time spent counseling/coordinating care: > 60 minutes

## 2024-01-04 NOTE — Assessment & Plan Note (Signed)
 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 Discussed the aggressive nature of the disease and the limited treatment options. Explained that the goal of chemotherapy is to control the cancer from further spreading and getting bigger, but it is not curable at this stage. Discussed the potential side effects of chemotherapy and the need for the patient to be strong enough to tolerate it.  -Today I discussed staging, prognosis, plan of care, treatment options.  Reviewed NCCN guidelines.  -His ECOG performance status is 3; 2 at best.  It would be challenging to treat him with systemic chemotherapy, especially with aggressive regimens that are needed for pancreatic cancer.  Staging PET/CT on 12/24/2023 showed hypermetabolic pancreatic mass, liver metastasis, right supraclavicular, mediastinal, retrocrural and periaortic retroperitoneal lymphadenopathy.  Hypermetabolic omental, mesenteric and peritoneal metastasis were also noted.  Scattered tiny lung nodules, likely below the size threshold for PET scan.  Numerous bony hypermetabolic metastases.  Foundation One testing showed PD-L1 TPS of 35%.  Microsatellite stable.  4 Muts/Mb.  No evidence of BRCA1 or BRCA2 mutation.  No other actionable mutations.  Since his performance status is not allowing systemic chemotherapy, proposed systemic immunotherapy with single agent pembrolizumab given PD-L1 positivity.  Patient is agreeable.  Awaiting insurance approval.  -He is currently at a nursing home receiving rehab.  We hope his strength and appetite improves and he will return to his baseline prior to recent hospitalization.  Depending on eligibility for pembrolizumab or not, we will schedule follow-up appointments in the next 1 to 2 weeks.  If he is not approved for pembrolizumab, if his performance status continues to decline, best supportive care would be the only appropriate option for him.

## 2024-01-06 DIAGNOSIS — L24A2 Irritant contact dermatitis due to fecal, urinary or dual incontinence: Secondary | ICD-10-CM | POA: Diagnosis not present

## 2024-01-09 DIAGNOSIS — C259 Malignant neoplasm of pancreas, unspecified: Secondary | ICD-10-CM | POA: Diagnosis not present

## 2024-01-12 ENCOUNTER — Inpatient Hospital Stay: Payer: Medicare Other | Admitting: Family Medicine

## 2024-01-14 ENCOUNTER — Telehealth: Payer: Self-pay | Admitting: Family Medicine

## 2024-01-14 NOTE — Telephone Encounter (Signed)
I called Steward Drone- patient was asleep at home.  I thanked her for taking my call and for her effort.    Discussed recent events, cancer dx, hospitalization, rehab and transition back to home.  She reports him having progressive decline and having episodic confusion. Condolences given.    Discussed palliative care vs hospice in general.    Discussed HH aide/nursing and palliative care eval, along with the fact that insurance will not cover HH order from me without a face to face eval.    [facility] staff- Please check to see if there is an existing [facility] referral and see if it is possible to expedite [facility] RN eval and aide.  Please check with palliative care to see about eval for patient.    Dr. Arlana Pouch- I greatly appreciate your help.  If needed, can you put in the order for expedited home health based on your face to face visit on 01/04/24?  I thank all involved.

## 2024-01-18 ENCOUNTER — Ambulatory Visit: Payer: Medicare Other

## 2024-01-18 ENCOUNTER — Encounter: Payer: Self-pay | Admitting: Oncology

## 2024-01-18 DIAGNOSIS — I639 Cerebral infarction, unspecified: Secondary | ICD-10-CM | POA: Diagnosis not present

## 2024-01-18 LAB — CUP PACEART REMOTE DEVICE CHECK
Date Time Interrogation Session: 20250126230221
Implantable Pulse Generator Implant Date: 20210525

## 2024-01-19 ENCOUNTER — Other Ambulatory Visit: Payer: Medicare Other

## 2024-01-19 ENCOUNTER — Inpatient Hospital Stay (HOSPITAL_BASED_OUTPATIENT_CLINIC_OR_DEPARTMENT_OTHER): Payer: Medicare Other | Admitting: Oncology

## 2024-01-19 ENCOUNTER — Encounter: Payer: Self-pay | Admitting: Oncology

## 2024-01-19 DIAGNOSIS — C259 Malignant neoplasm of pancreas, unspecified: Secondary | ICD-10-CM | POA: Diagnosis not present

## 2024-01-19 NOTE — Progress Notes (Signed)
[facility]  HEMATOLOGY-ONCOLOGY ELECTRONIC VISIT PROGRESS NOTE  PATIENT NAME: Nathan Fernandez   MR#: [id number] DOB: 09/03/1941  DATE OF SERVICE: 01/19/2024  Patient Care Team: Nathan Nam, MD as PCP - General (Family Medicine) Nathan Stade, MD as PCP - Cardiology (Cardiology) Nathan Stade, MD as Consulting Physician (Cardiology) Nathan Fernandez, [facility] (Inactive) as Pharmacist (Pharmacist) Nathan Ket, RN as [facility] Care Management Nathan Berger, MD as Consulting Physician (Ophthalmology) Nathan Fernandez, Nathan Balint, RN as Oncology Nurse Navigator Nathan Crutch, MD as Consulting Physician (Oncology)  I connected with the patient via telephone conference and verified that I am speaking with the correct person using two identifiers. The patient's location is at home and I am providing care from the [facility].  I discussed the limitations, risks, security and privacy concerns of performing an evaluation and management service by e-visits and the availability of in person appointments.  I also discussed with the patient that there may be a patient responsible charge related to this service. The patient expressed understanding and agreed to proceed.   ASSESSMENT & PLAN:   Nathan Fernandez is a 83 y.o. 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  Poor performance status precluding initiation of systemic treatments.  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 Discussed the aggressive nature of the disease and the limited treatment options. Explained that the goal of  systemic treatments is to control the cancer from further spreading and getting bigger, but it is not curable at this stage. Discussed the potential side effects of chemotherapy and the need for the patient to be strong enough to tolerate it.  -Previously I discussed staging, prognosis, plan of care, treatment options.  Reviewed NCCN guidelines.  Staging PET/CT on 12/24/2023 showed hypermetabolic pancreatic mass, liver metastasis, right supraclavicular, mediastinal, retrocrural and periaortic retroperitoneal lymphadenopathy.  Hypermetabolic omental, mesenteric and peritoneal metastasis were also noted.  Scattered tiny lung nodules, likely below the size threshold for PET scan.  Numerous bony hypermetabolic metastases.  Foundation One testing showed PD-L1 TPS of 35%.  Microsatellite stable.  4 Muts/Mb.  No evidence of BRCA1 or BRCA2 mutation.  No other actionable mutations.  Since his performance status was not allowing systemic chemotherapy, proposed systemic immunotherapy with single agent pembrolizumab given PD-L1 positivity.  We were waiting on insurance approval.  Patient's performance status continued to decline to ECOG 3/4.  I did detailed phone visit with patient and his wife on 01/19/2024.  Recommended to enroll in hospice.  Patient's wife is undecided at this time but she will reach out to [place] once she makes a decision.   I discussed the assessment and treatment plan with the patient's wife. She was provided an opportunity to ask questions and all were answered. She agreed with the plan and demonstrated an understanding of the instructions.    I spent 15 minutes over the phone with the patient/ his wife reviewing test results, discuss management and coordination/planning of care.  Nathan Crutch, MD 01/19/2024 5:21 PM [facility] - A DEPT OF [facility] [address] [place] [address] Dept: [phone] Dept Fax:  [phone]   INTERVAL HISTORY:  Please see above for problem oriented charting.  The purpose of today's discussion is to explain recent lab results and to formulate plan of care.  He has been experiencing a rapid decline in health. According to his wife, he has been mostly sleeping and has become non-verbal, only able to mumble incoherently. He was recently discharged from the nursing home and is now at home. His wife reports that he appears to be constantly searching for something and has difficulty standing or walking. It took him approximately 45 minutes to return to bed after a trip to the bathroom due to weakness. His stomach has also been filling with fluid, a symptom that began before his discharge from the nursing home.   SUMMARY OF ONCOLOGIC HISTORY:  ONCOLOGY HISTORY:   [facility]/2023 with complaints of multiple falls over the week prior, progressive fatigue and weakness in the left arm and leg.  Initial CT chest without contrast suggested findings consistent with community-acquired pneumonia.  He was found to be in acute hypoxic respiratory failure and was admitted to the hospital for further evaluation and management.   During the hospital stay, he later developed hematuria which led to CT abdomen and pelvis with and without IV contrast on 12/06/2023.  CT scan showed a low-attenuation lesion within the distal tail of the pancreas measuring 3.8 x 2.5 cm, abutting the posterior wall of the gastric fundus.  Lesion appeared to encase the splenic artery and vein.  Small filling defect within the splenic vein.  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  Findings were compatible with pancreatic adenocarcinoma.  Multifocal, ill-defined bilobar low-density liver lesions, compatible with metastatic disease.  Signs of peritoneal carcinomatosis identified with the peritoneal nodule identified  within the anterior aspect of the right hemiabdomen.  Bilateral retrocrural and retroperitoneal adenopathy.   CA 19-9 was significantly elevated at 10,698 on 12/07/2023.   IR performed core needle biopsy of peritoneal mass on 12/08/2023.  Pathology showed moderately differentiated adenocarcinoma, morphologically consistent with a pancreatic primary.  Additional testing was not performed to preserve tissue for clinician directed testing.   On 12/09/2023, CT chest angiogram showed acute lobar and segmental right middle lobe and segmental and subsegmental multifocal right lower lobe pulmonary emboli.  No saddle emboli.  Numerous indistinct solid pulmonary nodules scattered throughout both lungs, largest measuring 0.9 cm in the left lower lobe.  This was consistent with pulmonary metastasis.  Mild mediastinal, bilateral retrocrural and right hilar adenopathy, likely nodal metastasis.  Small sclerotic lesions in the manubrium and posterior T6 vertebral body, suspect osseous metastasis.   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   On his initial consultation with [place] on 12/21/2023, his ECOG performance status was low at 3, ECOG 2 at best.     Request submitted for staging PET/CT, Guardant360, foundation One testing on the specimen.     Staging PET/CT on 12/24/2023 showed hypermetabolic pancreatic mass, liver metastasis, right supraclavicular, mediastinal, retrocrural and periaortic retroperitoneal lymphadenopathy.  Hypermetabolic omental, mesenteric and peritoneal metastasis were also noted.  Scattered tiny lung nodules, likely below the size threshold for PET scan.  Numerous bony hypermetabolic metastases.   Foundation One testing showed PD-L1 TPS of 35%.  Microsatellite stable.  4 Muts/Mb.  No evidence of BRCA1 or BRCA2 mutation.  No other actionable mutations.   Since his performance status was  not allowing systemic chemotherapy, proposed  systemic immunotherapy with single agent pembrolizumab given PD-L1 positivity.  We were waiting on insurance approval.  Patient's performance status continued to decline to ECOG 3/4.  I did detailed phone visit with patient and his wife on 01/19/2024.  Recommended to enroll in hospice.  Patient's wife is undecided at this time but she will reach out to [place] once she makes a decision.   Oncology History  Primary pancreatic adenocarcinoma (HCC)  12/21/2023 Initial Diagnosis   Primary pancreatic adenocarcinoma (HCC)   12/21/2023 Cancer Staging   Staging form: Exocrine Pancreas, AJCC 8th Edition - Clinical stage from 12/21/2023: Stage IV (cTX, cNX, pM1) - Signed by Nathan Crutch, MD on 12/21/2023   01/04/2024 - 01/04/2024 Chemotherapy   Patient is on Treatment Plan : PANCREATIC MSI-H/DMMR Pembrolizumab (200) q21d       REVIEW OF SYSTEMS:    Review of Systems - Oncology  All other pertinent systems were reviewed with the patient and are negative.  I have reviewed the past medical history, past surgical history, social history and family history with the patient and they are unchanged from previous note.  ALLERGIES:  He is allergic to ace inhibitors, angiotensin receptor blockers, vasotec [enalapril], and pravachol [pravastatin].  MEDICATIONS:  Current Outpatient Medications  Medication Sig Dispense Refill   acetaminophen (TYLENOL) 325 MG tablet Take 2 tablets (650 mg total) by mouth every 6 (six) hours as needed for mild pain (pain score 1-3) (or Fever >/= 101).     albuterol (VENTOLIN HFA) 108 (90 Base) MCG/ACT inhaler Inhale 1-2 puffs into the lungs every 6 (six) hours as needed (COPD). 1 each 3   Ascorbic Acid (VITAMIN C PO) Take 1 tablet by mouth daily.     aspirin EC 81 MG tablet Take 81 mg by mouth daily. Swallow whole.     B Complex CAPS Take 1 capsule by mouth daily. (Patient not taking: Reported on 12/21/2023)     B Complex-C (B-COMPLEX WITH VITAMIN C) tablet Take 1 tablet by  mouth daily. (Patient not taking: Reported on 01/04/2024)     buPROPion (WELLBUTRIN SR) 150 MG 12 hr tablet Take 1 tablet (150 mg total) by mouth daily.     carvedilol (COREG) 6.25 MG tablet TAKE 1 TABLET BY MOUTH TWICE A DAY 180 tablet 1   Cholecalciferol (VITAMIN D-3 PO) Take 1 capsule by mouth daily. (Patient not taking: Reported on 01/04/2024)     Cyanocobalamin (B-12 PO) Take 1 tablet by mouth daily.     cyclobenzaprine (FLEXERIL) 5 MG tablet Take 1 tablet (5 mg total) by mouth 3 (three) times daily as needed for muscle spasms.     diclofenac Sodium (VOLTAREN) 1 % GEL Apply topically 4 (four) times daily.     dorzolamide-timolol (COSOPT) 22.3-6.8 MG/ML ophthalmic solution Place 1 drop into both eyes 2 (two) times daily.     ELDERBERRY PO Take 1 capsule by mouth in the morning and at bedtime. (Patient not taking: Reported on 12/21/2023)     enoxaparin (LOVENOX) 80 MG/0.8ML injection Inject 0.65 mLs (65 mg total) into the skin 2 (two) times daily.     feeding supplement (ENSURE ENLIVE / ENSURE PLUS) LIQD Take 237 mLs by mouth 2 (two) times daily between meals. 237 mL 12   fenofibrate (TRICOR) 48 MG tablet TAKE 1 TABLET BY MOUTH EVERY DAY (Patient not taking: Reported on 12/21/2023) 90 tablet 0   furosemide (LASIX) 40 MG tablet Take 1 tablet (40 mg total) by mouth  daily.     gabapentin (NEURONTIN) 300 MG capsule Take 1 capsule (300 mg total) by mouth 3 (three) times daily.     Glucosamine-Chondroitin (GLUCOSAMINE CHONDR COMPLEX PO) Take 1 tablet by mouth daily. (Patient not taking: Reported on 12/21/2023)     hydrALAZINE (APRESOLINE) 10 MG tablet TAKE 1 TABLET BY MOUTH EVERY 12 HOURS. 180 tablet 3   HYDROcodone-acetaminophen (NORCO/VICODIN) 5-325 MG tablet Take 1 tablet by mouth every 6 (six) hours as needed for moderate pain (pain score 4-6).     hydrocortisone (ANUSOL-HC) 25 MG suppository Place 1 suppository (25 mg total) rectally 2 (two) times daily. (Patient not taking: Reported on 01/04/2024)  12 suppository 0   hydrocortisone cream 0.5 % Apply topically 2 (two) times daily as needed for itching. 30 g 0   isosorbide mononitrate (IMDUR) 30 MG 24 hr tablet TAKE 1/2 TABLET BY MOUTH DAILY 45 tablet 3   ketoconazole (NIZORAL) 2 % cream APPLY 1 APPLICATION TOPICALLY DAILY AS NEEDED FOR IRRITATION. (Patient not taking: Reported on 12/21/2023) 30 g 2   lidocaine (XYLOCAINE) 5 % ointment Apply 1 Application topically as needed. 35.44 g 0   nitroGLYCERIN (NITROSTAT) 0.4 MG SL tablet Place 1 tablet (0.4 mg total) under the tongue every 5 (five) minutes as needed for chest pain. Up to 3 doses (Patient not taking: Reported on 01/04/2024) 25 tablet 3   ondansetron (ZOFRAN) 4 MG tablet Take 1 tablet (4 mg total) by mouth every 6 (six) hours as needed for nausea. (Patient not taking: Reported on 01/04/2024) 20 tablet 0   polyethylene glycol powder (GLYCOLAX/MIRALAX) 17 GM/SCOOP powder Take 17 g by mouth daily as needed.     rosuvastatin (CRESTOR) 20 MG tablet TAKE 1 TABLET BY MOUTH EVERY DAY 90 tablet 1   spironolactone (ALDACTONE) 25 MG tablet Take 0.5 tablets (12.5 mg total) by mouth daily.     traMADol (ULTRAM) 50 MG tablet Take 50 mg by mouth every 6 (six) hours as needed (Per MAR from [facility]). (Patient not taking: Reported on 01/04/2024)     traZODone (DESYREL) 100 MG tablet Take 1-2 tablets (100-200 mg total) by mouth at bedtime. (Patient taking differently: Take 100 mg by mouth at bedtime.)     VITAMIN D PO Take 1 tablet by mouth daily. (Patient not taking: Reported on 12/21/2023)     No current facility-administered medications for this visit.    PHYSICAL EXAMINATION: ECOG PERFORMANCE STATUS: 3 - Symptomatic, >50% confined to bed  LABORATORY DATA:   I have reviewed the data as listed.  Recent Results (from the past 2160 hours)  CUP PACEART REMOTE DEVICE CHECK     Status: None   Collection Time: 11/08/23 11:06 PM  Result Value Ref Range   Date Time Interrogation Session  [phone]    Pulse Generator Manufacturer MERM    Pulse Gen Model LNQ22 LINQ II    Pulse Gen Serial Number [id number]    Clinic Name [facility]    Implantable Pulse Generator Type ICM/ILR    Implantable Pulse Generator Implant Date [id number]   Comprehensive metabolic panel     Status: Abnormal   Collection Time: 11/13/23 11:55 AM  Result Value Ref Range   Sodium 139 135 - 145 mEq/L   Potassium 4.1 3.5 - 5.1 mEq/L   Chloride 102 96 - 112 mEq/L   CO2 31 19 - 32 mEq/L   Glucose, Bld 154 (H) 70 - 99 mg/dL   BUN 26 (H) 6 - 23 mg/dL  Creatinine, Ser 1.20 0.40 - 1.50 mg/dL   Total Bilirubin 0.5 0.2 - 1.2 mg/dL   Alkaline Phosphatase 56 39 - 117 U/L   AST 15 0 - 37 U/L   ALT 9 0 - 53 U/L   Total Protein 5.3 (L) 6.0 - 8.3 g/dL   Albumin 3.1 (L) 3.5 - 5.2 g/dL   GFR 40.98 (L) >11.91 mL/min    Comment: Calculated using the CKD-EPI Creatinine Equation (2021)   Calcium 8.9 8.4 - 10.5 mg/dL  CBC with Differential/Platelet     Status: Abnormal   Collection Time: 11/13/23 11:55 AM  Result Value Ref Range   WBC 8.7 4.0 - 10.5 K/uL   RBC 4.08 (L) 4.22 - 5.81 Mil/uL   Hemoglobin 12.5 (L) 13.0 - 17.0 g/dL   HCT 47.8 (L) 29.5 - 62.1 %   MCV 93.3 78.0 - 100.0 fl   MCHC 33.0 30.0 - 36.0 g/dL   RDW 30.8 65.7 - 84.6 %   Platelets 190.0 150.0 - 400.0 K/uL   Neutrophils Relative % 73.5 43.0 - 77.0 %   Lymphocytes Relative 15.4 12.0 - 46.0 %   Monocytes Relative 8.2 3.0 - 12.0 %   Eosinophils Relative 1.4 0.0 - 5.0 %   Basophils Relative 1.5 0.0 - 3.0 %   Neutro Abs 6.4 1.4 - 7.7 K/uL   Lymphs Abs 1.3 0.7 - 4.0 K/uL   Monocytes Absolute 0.7 0.1 - 1.0 K/uL   Eosinophils Absolute 0.1 0.0 - 0.7 K/uL   Basophils Absolute 0.1 0.0 - 0.1 K/uL  Vitamin B12     Status: None   Collection Time: 11/13/23 11:55 AM  Result Value Ref Range   Vitamin B-12 748 211 - 911 pg/mL  TSH     Status: None   Collection Time: 11/13/23 11:55 AM  Result Value Ref Range   TSH 1.46 0.35 - 5.50 uIU/mL   Hemoglobin A1c     Status: Abnormal   Collection Time: 11/13/23 11:55 AM  Result Value Ref Range   Hgb A1c MFr Bld 6.8 (H) 4.6 - 6.5 %    Comment: Glycemic Control Guidelines for People with Diabetes:Non Diabetic:  <6%Goal of Therapy: <7%Additional Action Suggested:  >8%   Basic metabolic panel     Status: Abnormal   Collection Time: 11/30/23 10:19 AM  Result Value Ref Range   Sodium 137 135 - 145 mmol/L   Potassium 4.2 3.5 - 5.1 mmol/L   Chloride 102 98 - 111 mmol/L   CO2 26 22 - 32 mmol/L   Glucose, Bld 168 (H) 70 - 99 mg/dL    Comment: Glucose reference range applies only to samples taken after fasting for at least 8 hours.   BUN 27 (H) 8 - 23 mg/dL   Creatinine, Ser 9.62 0.61 - 1.24 mg/dL   Calcium 8.6 (L) 8.9 - 10.3 mg/dL   GFR, Estimated >95 >28 mL/min    Comment: (NOTE) Calculated using the CKD-EPI Creatinine Equation (2021)    Anion gap 9 5 - 15    Comment: Performed at [facility], [address] [place], [place] [address]  CBC     Status: Abnormal   Collection Time: 11/30/23 10:19 AM  Result Value Ref Range   WBC 12.9 (H) 4.0 - 10.5 K/uL   RBC 3.88 (L) 4.22 - 5.81 MIL/uL   Hemoglobin 11.9 (L) 13.0 - 17.0 g/dL   HCT 40.1 (L) 02.7 - 25.3 %   MCV 91.2 80.0 - 100.0 fL  MCH 30.7 26.0 - 34.0 pg   MCHC 33.6 30.0 - 36.0 g/dL   RDW 16.1 09.6 - 04.5 %   Platelets 161 150 - 400 K/uL   nRBC 0.0 0.0 - 0.2 %    Comment: Performed at [facility], [address] [place], [place] [address]  Troponin I (High Sensitivity)     Status: Abnormal   Collection Time: 11/30/23 10:19 AM  Result Value Ref Range   Troponin I (High Sensitivity) 57 (H) <18 ng/L    Comment: (NOTE) Elevated high sensitivity troponin I (hsTnI) values and significant  changes across serial measurements may suggest ACS but many other  chronic and acute conditions are known to elevate hsTnI results.  Refer to the "Links" section for chest pain algorithms and additional   guidance. Performed at [facility], [address] [place], [place] [address]   Brain natriuretic peptide     Status: Abnormal   Collection Time: 11/30/23 10:19 AM  Result Value Ref Range   B Natriuretic Peptide 475.3 (H) 0.0 - 100.0 pg/mL    Comment: Performed at [facility], [address] [place], [place] [address]  Resp panel by RT-PCR (RSV, Flu A&B, Covid) Anterior Nasal Swab     Status: None   Collection Time: 11/30/23 10:19 AM   Specimen: Anterior Nasal Swab  Result Value Ref Range   SARS Coronavirus 2 by RT PCR NEGATIVE NEGATIVE    Comment: (NOTE) SARS-CoV-2 target nucleic acids are NOT DETECTED.  The SARS-CoV-2 RNA is generally detectable in upper respiratory specimens during the acute phase of infection. The lowest concentration of SARS-CoV-2 viral copies this assay can detect is 138 copies/mL. A negative result does not preclude SARS-Cov-2 infection and should not be used as the sole basis for treatment or other patient management decisions. A negative result may occur with  improper specimen collection/handling, submission of specimen other than nasopharyngeal swab, presence of viral mutation(s) within the areas targeted by this assay, and inadequate number of viral copies(<138 copies/mL). A negative result must be combined with clinical observations, patient history, and epidemiological information. The expected result is Negative.  Fact Sheet for Patients:  BloggerCourse.com  Fact Sheet for Healthcare Providers:  SeriousBroker.it  This test is no t yet approved or cleared by the [place] FDA and  has been authorized for detection and/or diagnosis of SARS-CoV-2 by FDA under an Emergency Use Authorization (EUA). This EUA will remain  in effect (meaning this test can be used) for the duration of the COVID-19 declaration under Section 564(b)(1) of the Act, 21 U.S.C.section  360bbb-3(b)(1), unless the authorization is terminated  or revoked sooner.       Influenza A by PCR NEGATIVE NEGATIVE   Influenza B by PCR NEGATIVE NEGATIVE    Comment: (NOTE) The Xpert Xpress SARS-CoV-2/FLU/RSV plus assay is intended as an aid in the diagnosis of influenza from Nasopharyngeal swab specimens and should not be used as a sole basis for treatment. Nasal washings and aspirates are unacceptable for Xpert Xpress SARS-CoV-2/FLU/RSV testing.  Fact Sheet for Patients: BloggerCourse.com  Fact Sheet for Healthcare Providers: SeriousBroker.it  This test is not yet approved or cleared by the [place] FDA and has been authorized for detection and/or diagnosis of SARS-CoV-2 by FDA under an Emergency Use Authorization (EUA). This EUA will remain in effect (meaning this test can be used) for the duration of the COVID-19 declaration under Section 564(b)(1) of the Act, 21 U.S.C.  section 360bbb-3(b)(1), unless the authorization is terminated or revoked.     Resp Syncytial Virus by PCR NEGATIVE NEGATIVE    Comment: (NOTE) Fact Sheet for Patients: BloggerCourse.com  Fact Sheet for Healthcare Providers: SeriousBroker.it  This test is not yet approved or cleared by the [place] FDA and has been authorized for detection and/or diagnosis of SARS-CoV-2 by FDA under an Emergency Use Authorization (EUA). This EUA will remain in effect (meaning this test can be used) for the duration of the COVID-19 declaration under Section 564(b)(1) of the Act, 21 U.S.C. section 360bbb-3(b)(1), unless the authorization is terminated or revoked.  Performed at [facility], [address] [place], [place] [address]   Troponin I (High Sensitivity)     Status: Abnormal   Collection Time: 11/30/23 12:44 PM  Result Value Ref Range   Troponin I (High Sensitivity) 62 (H) <18 ng/L     Comment: (NOTE) Elevated high sensitivity troponin I (hsTnI) values and significant  changes across serial measurements may suggest ACS but many other  chronic and acute conditions are known to elevate hsTnI results.  Refer to the "Links" section for chest pain algorithms and additional  guidance. Performed at [facility], [address] [place], [place] [address]   Lactic acid, plasma     Status: None   Collection Time: 11/30/23  6:04 PM  Result Value Ref Range   Lactic Acid, Venous 1.2 0.5 - 1.9 mmol/L    Comment: Performed at [facility], [address] [place], [place] [address]  Glucose, capillary     Status: Abnormal   Collection Time: 11/30/23  8:46 PM  Result Value Ref Range   Glucose-Capillary 129 (H) 70 - 99 mg/dL    Comment: Glucose reference range applies only to samples taken after fasting for at least 8 hours.  Lactic acid, plasma     Status: None   Collection Time: 11/30/23  8:59 PM  Result Value Ref Range   Lactic Acid, Venous 1.2 0.5 - 1.9 mmol/L    Comment: Performed at [facility], [address] [place], [place] [address]  Basic metabolic panel     Status: Abnormal   Collection Time: 12/01/23  4:44 AM  Result Value Ref Range   Sodium 137 135 - 145 mmol/L   Potassium 3.9 3.5 - 5.1 mmol/L   Chloride 106 98 - 111 mmol/L   CO2 22 22 - 32 mmol/L   Glucose, Bld 136 (H) 70 - 99 mg/dL    Comment: Glucose reference range applies only to samples taken after fasting for at least 8 hours.   BUN 23 8 - 23 mg/dL   Creatinine, Ser 6.57 0.61 - 1.24 mg/dL   Calcium 8.1 (L) 8.9 - 10.3 mg/dL   GFR, Estimated >84 >69 mL/min    Comment: (NOTE) Calculated using the CKD-EPI Creatinine Equation (2021)    Anion gap 9 5 - 15    Comment: Performed at [facility], [address], [place], [place] [address]  CBC     Status: Abnormal   Collection Time: 12/01/23  4:44 AM  Result Value Ref Range   WBC 9.8 4.0  - 10.5 K/uL   RBC 3.29 (L) 4.22 - 5.81 MIL/uL   Hemoglobin 10.2 (L) 13.0 - 17.0 g/dL   HCT 84.1 (L) 32.4 - 40.1 %   MCV 94.5 80.0 - 100.0 fL   MCH 31.0 26.0 - 34.0 pg  MCHC 32.8 30.0 - 36.0 g/dL   RDW 40.9 81.1 - 91.4 %   Platelets 143 (L) 150 - 400 K/uL   nRBC 0.0 0.0 - 0.2 %    Comment: Performed at [facility], [address], [place], [place] [address]  Glucose, capillary     Status: Abnormal   Collection Time: 12/01/23  8:07 AM  Result Value Ref Range   Glucose-Capillary 144 (H) 70 - 99 mg/dL    Comment: Glucose reference range applies only to samples taken after fasting for at least 8 hours.   Comment 1 Notify RN    Comment 2 Document in Chart   Glucose, capillary     Status: Abnormal   Collection Time: 12/01/23 11:42 AM  Result Value Ref Range   Glucose-Capillary 136 (H) 70 - 99 mg/dL    Comment: Glucose reference range applies only to samples taken after fasting for at least 8 hours.   Comment 1 Notify RN    Comment 2 Document in Chart   Glucose, capillary     Status: Abnormal   Collection Time: 12/01/23  5:10 PM  Result Value Ref Range   Glucose-Capillary 119 (H) 70 - 99 mg/dL    Comment: Glucose reference range applies only to samples taken after fasting for at least 8 hours.  Glucose, capillary     Status: Abnormal   Collection Time: 12/01/23 10:11 PM  Result Value Ref Range   Glucose-Capillary 151 (H) 70 - 99 mg/dL    Comment: Glucose reference range applies only to samples taken after fasting for at least 8 hours.   Comment 1 Notify RN   Magnesium     Status: None   Collection Time: 12/02/23  5:13 AM  Result Value Ref Range   Magnesium 1.9 1.7 - 2.4 mg/dL    Comment: Performed at [facility], [address], [place], [place] [address]  Phosphorus     Status: None   Collection Time: 12/02/23  5:13 AM  Result Value Ref Range   Phosphorus 2.9 2.5 - 4.6 mg/dL    Comment: Performed at [facility], [address] [place], [place] [address]  Hepatic function panel     Status: Abnormal   Collection Time: 12/02/23  5:13 AM  Result Value Ref Range   Total Protein 5.1 (L) 6.5 - 8.1 g/dL   Albumin 2.2 (L) 3.5 - 5.0 g/dL   AST 23 15 - 41 U/L   ALT 13 0 - 44 U/L   Alkaline Phosphatase 76 38 - 126 U/L   Total Bilirubin 0.6 <1.2 mg/dL   Bilirubin, Direct 0.2 0.0 - 0.2 mg/dL   Indirect Bilirubin 0.4 0.3 - 0.9 mg/dL    Comment: Performed at [facility], [address] [place], [place] [address]  Basic metabolic panel     Status: Abnormal   Collection Time: 12/02/23  5:15 AM  Result Value Ref Range   Sodium 133 (L) 135 - 145 mmol/L   Potassium 3.6 3.5 - 5.1 mmol/L   Chloride 100 98 - 111 mmol/L   CO2 26 22 - 32 mmol/L   Glucose, Bld 132 (H) 70 - 99 mg/dL    Comment: Glucose reference range applies only to samples taken after fasting for at least 8 hours.   BUN 25 (H) 8 - 23 mg/dL   Creatinine, Ser 9.52 0.61 - 1.24 mg/dL   Calcium 8.2 (L) 8.9 - 10.3 mg/dL   GFR,  Estimated >60 >60 mL/min    Comment: (NOTE) Calculated using the CKD-EPI Creatinine Equation (2021)    Anion gap 7 5 - 15    Comment: Performed at [facility], [address] [place], [place] [address]  Procalcitonin     Status: None   Collection Time: 12/02/23  5:15 AM  Result Value Ref Range   Procalcitonin <0.10 ng/mL    Comment:        Interpretation: PCT (Procalcitonin) <= 0.5 ng/mL: Systemic infection (sepsis) is not likely. Local bacterial infection is possible. (NOTE)       Sepsis PCT Algorithm           Lower Respiratory Tract                                      Infection PCT Algorithm    ----------------------------     ----------------------------         PCT < 0.25 ng/mL                PCT < 0.10 ng/mL          Strongly encourage             Strongly discourage   discontinuation of antibiotics    initiation of antibiotics    ----------------------------      -----------------------------       PCT 0.25 - 0.50 ng/mL            PCT 0.10 - 0.25 ng/mL               OR       >80% decrease in PCT            Discourage initiation of                                            antibiotics      Encourage discontinuation           of antibiotics    ----------------------------     -----------------------------         PCT >= 0.50 ng/mL              PCT 0.26 - 0.50 ng/mL               AND        <80% decrease in PCT             Encourage initiation of                                             antibiotics       Encourage continuation           of antibiotics    ----------------------------     -----------------------------        PCT >= 0.50 ng/mL                  PCT > 0.50 ng/mL               AND         increase in PCT  Strongly encourage                                      initiation of antibiotics    Strongly encourage escalation           of antibiotics                                     -----------------------------                                           PCT <= 0.25 ng/mL                                                 OR                                        > 80% decrease in PCT                                      Discontinue / Do not initiate                                             antibiotics  Performed at [facility], [address] [place], [place] [address]   CBC     Status: Abnormal   Collection Time: 12/02/23  5:15 AM  Result Value Ref Range   WBC 10.6 (H) 4.0 - 10.5 K/uL   RBC 3.54 (L) 4.22 - 5.81 MIL/uL   Hemoglobin 11.0 (L) 13.0 - 17.0 g/dL   HCT 19.1 (L) 47.8 - 29.5 %   MCV 94.6 80.0 - 100.0 fL   MCH 31.1 26.0 - 34.0 pg   MCHC 32.8 30.0 - 36.0 g/dL   RDW 62.1 30.8 - 65.7 %   Platelets 180 150 - 400 K/uL   nRBC 0.0 0.0 - 0.2 %    Comment: Performed at [facility], [address], [place], [place] [address]  Glucose, capillary     Status: Abnormal    Collection Time: 12/02/23  7:32 AM  Result Value Ref Range   Glucose-Capillary 118 (H) 70 - 99 mg/dL    Comment: Glucose reference range applies only to samples taken after fasting for at least 8 hours.   Comment 1 Notify RN    Comment 2 Document in Chart   Glucose, capillary     Status: Abnormal   Collection Time: 12/02/23 12:09 PM  Result Value Ref Range   Glucose-Capillary 153 (H) 70 - 99 mg/dL    Comment: Glucose reference range applies only to samples taken after fasting for at least 8 hours.  Glucose, capillary     Status: Abnormal   Collection Time: 12/02/23  5:07 PM  Result Value Ref Range   Glucose-Capillary  125 (H) 70 - 99 mg/dL    Comment: Glucose reference range applies only to samples taken after fasting for at least 8 hours.  Glucose, capillary     Status: Abnormal   Collection Time: 12/02/23  9:48 PM  Result Value Ref Range   Glucose-Capillary 140 (H) 70 - 99 mg/dL    Comment: Glucose reference range applies only to samples taken after fasting for at least 8 hours.  Comprehensive metabolic panel     Status: Abnormal   Collection Time: 12/03/23  5:40 AM  Result Value Ref Range   Sodium 136 135 - 145 mmol/L   Potassium 3.6 3.5 - 5.1 mmol/L   Chloride 100 98 - 111 mmol/L   CO2 25 22 - 32 mmol/L   Glucose, Bld 142 (H) 70 - 99 mg/dL    Comment: Glucose reference range applies only to samples taken after fasting for at least 8 hours.   BUN 27 (H) 8 - 23 mg/dL   Creatinine, Ser 1.61 0.61 - 1.24 mg/dL   Calcium 8.3 (L) 8.9 - 10.3 mg/dL   Total Protein 5.1 (L) 6.5 - 8.1 g/dL   Albumin 2.2 (L) 3.5 - 5.0 g/dL   AST 23 15 - 41 U/L   ALT 14 0 - 44 U/L   Alkaline Phosphatase 74 38 - 126 U/L   Total Bilirubin 0.7 <1.2 mg/dL   GFR, Estimated >09 >60 mL/min    Comment: (NOTE) Calculated using the CKD-EPI Creatinine Equation (2021)    Anion gap 11 5 - 15    Comment: Performed at [facility], [address], [place], [place] [address]  CBC with  Differential/Platelet     Status: Abnormal   Collection Time: 12/03/23  5:40 AM  Result Value Ref Range   WBC 9.3 4.0 - 10.5 K/uL   RBC 3.46 (L) 4.22 - 5.81 MIL/uL   Hemoglobin 10.5 (L) 13.0 - 17.0 g/dL   HCT 81.1 (L) 91.4 - 78.2 %   MCV 93.4 80.0 - 100.0 fL   MCH 30.3 26.0 - 34.0 pg   MCHC 32.5 30.0 - 36.0 g/dL   RDW 95.6 21.3 - 08.6 %   Platelets 201 150 - 400 K/uL   nRBC 0.0 0.0 - 0.2 %   Neutrophils Relative % 77 %   Neutro Abs 7.4 1.7 - 7.7 K/uL   Lymphocytes Relative 9 %   Lymphs Abs 0.8 0.7 - 4.0 K/uL   Monocytes Relative 9 %   Monocytes Absolute 0.8 0.1 - 1.0 K/uL   Eosinophils Relative 3 %   Eosinophils Absolute 0.2 0.0 - 0.5 K/uL   Basophils Relative 1 %   Basophils Absolute 0.1 0.0 - 0.1 K/uL   Immature Granulocytes 1 %   Abs Immature Granulocytes 0.05 0.00 - 0.07 K/uL    Comment: Performed at [facility], [address], [place], [place] [address]  Magnesium     Status: None   Collection Time: 12/03/23  5:40 AM  Result Value Ref Range   Magnesium 1.8 1.7 - 2.4 mg/dL    Comment: Performed at [facility], [address], [place], [place] [address]  Phosphorus     Status: None   Collection Time: 12/03/23  5:40 AM  Result Value Ref Range   Phosphorus 2.9 2.5 - 4.6 mg/dL    Comment: Performed at [facility], [address] [place], [place] [address]  Glucose, capillary     Status: Abnormal  Collection Time: 12/03/23  8:08 AM  Result Value Ref Range   Glucose-Capillary 133 (H) 70 - 99 mg/dL    Comment: Glucose reference range applies only to samples taken after fasting for at least 8 hours.  Glucose, capillary     Status: Abnormal   Collection Time: 12/03/23 11:52 AM  Result Value Ref Range   Glucose-Capillary 156 (H) 70 - 99 mg/dL    Comment: Glucose reference range applies only to samples taken after fasting for at least 8 hours.  Glucose, capillary     Status: Abnormal   Collection Time: 12/03/23   4:58 PM  Result Value Ref Range   Glucose-Capillary 118 (H) 70 - 99 mg/dL    Comment: Glucose reference range applies only to samples taken after fasting for at least 8 hours.  Glucose, capillary     Status: Abnormal   Collection Time: 12/03/23  8:34 PM  Result Value Ref Range   Glucose-Capillary 144 (H) 70 - 99 mg/dL    Comment: Glucose reference range applies only to samples taken after fasting for at least 8 hours.  CBC with Differential/Platelet     Status: Abnormal   Collection Time: 12/04/23  5:45 AM  Result Value Ref Range   WBC 9.3 4.0 - 10.5 K/uL   RBC 3.08 (L) 4.22 - 5.81 MIL/uL   Hemoglobin 9.6 (L) 13.0 - 17.0 g/dL   HCT 56.2 (L) 13.0 - 86.5 %   MCV 92.2 80.0 - 100.0 fL   MCH 31.2 26.0 - 34.0 pg   MCHC 33.8 30.0 - 36.0 g/dL   RDW 78.4 69.6 - 29.5 %   Platelets 200 150 - 400 K/uL   nRBC 0.0 0.0 - 0.2 %   Neutrophils Relative % 70 %   Neutro Abs 6.6 1.7 - 7.7 K/uL   Lymphocytes Relative 13 %   Lymphs Abs 1.2 0.7 - 4.0 K/uL   Monocytes Relative 11 %   Monocytes Absolute 1.0 0.1 - 1.0 K/uL   Eosinophils Relative 4 %   Eosinophils Absolute 0.3 0.0 - 0.5 K/uL   Basophils Relative 1 %   Basophils Absolute 0.1 0.0 - 0.1 K/uL   Immature Granulocytes 1 %   Abs Immature Granulocytes 0.07 0.00 - 0.07 K/uL    Comment: Performed at [facility], [address] [place], [place] [address]  Comprehensive metabolic panel     Status: Abnormal   Collection Time: 12/04/23  5:45 AM  Result Value Ref Range   Sodium 135 135 - 145 mmol/L   Potassium 3.6 3.5 - 5.1 mmol/L   Chloride 99 98 - 111 mmol/L   CO2 30 22 - 32 mmol/L   Glucose, Bld 123 (H) 70 - 99 mg/dL    Comment: Glucose reference range applies only to samples taken after fasting for at least 8 hours.   BUN 27 (H) 8 - 23 mg/dL   Creatinine, Ser 2.44 0.61 - 1.24 mg/dL   Calcium 8.2 (L) 8.9 - 10.3 mg/dL   Total Protein 4.6 (L) 6.5 - 8.1 g/dL   Albumin 2.1 (L) 3.5 - 5.0 g/dL   AST 21 15 - 41 U/L   ALT 14 0  - 44 U/L   Alkaline Phosphatase 65 38 - 126 U/L   Total Bilirubin 0.6 <1.2 mg/dL   GFR, Estimated >01 >02 mL/min    Comment: (NOTE) Calculated using the CKD-EPI Creatinine Equation (2021)    Anion gap 6 5 - 15    Comment: Performed  at [facility], [address], [place], [place] [address]  Phosphorus     Status: None   Collection Time: 12/04/23  5:45 AM  Result Value Ref Range   Phosphorus 2.7 2.5 - 4.6 mg/dL    Comment: Performed at [facility], [address], [place], [place] [address]  Magnesium     Status: None   Collection Time: 12/04/23  5:45 AM  Result Value Ref Range   Magnesium 1.9 1.7 - 2.4 mg/dL    Comment: Performed at [facility], [address], [place], [place] [address]  Vitamin B12     Status: None   Collection Time: 12/04/23  5:45 AM  Result Value Ref Range   Vitamin B-12 873 180 - 914 pg/mL    Comment: (NOTE) This assay is not validated for testing neonatal or myeloproliferative syndrome specimens for Vitamin B12 levels. Performed at [facility], [address], [place], [place] [address]   Folate     Status: None   Collection Time: 12/04/23  5:45 AM  Result Value Ref Range   Folate 12.5 >5.9 ng/mL    Comment: Performed at [facility], [address], [place], [place] [address]  Iron and TIBC     Status: Abnormal   Collection Time: 12/04/23  5:45 AM  Result Value Ref Range   Iron 36 (L) 45 - 182 ug/dL   TIBC 433 (L) 295 - 188 ug/dL   Saturation Ratios 21 17.9 - 39.5 %   UIBC 135 ug/dL    Comment: Performed at [facility], [address] [place], [place] [address]  Ferritin     Status: Abnormal   Collection Time: 12/04/23  5:45 AM  Result Value Ref Range   Ferritin 761 (H) 24 - 336 ng/mL    Comment: Performed at [facility], [address], [place], [place] [address]  Reticulocytes     Status: Abnormal    Collection Time: 12/04/23  5:45 AM  Result Value Ref Range   Retic Ct Pct 2.3 0.4 - 3.1 %   RBC. 3.08 (L) 4.22 - 5.81 MIL/uL   Retic Count, Absolute 71.8 19.0 - 186.0 K/uL   Immature Retic Fract 14.9 2.3 - 15.9 %    Comment: Performed at [facility], [address] [place], [place] [address]  Glucose, capillary     Status: Abnormal   Collection Time: 12/04/23  8:07 AM  Result Value Ref Range   Glucose-Capillary 103 (H) 70 - 99 mg/dL    Comment: Glucose reference range applies only to samples taken after fasting for at least 8 hours.  Glucose, capillary     Status: Abnormal   Collection Time: 12/04/23 11:59 AM  Result Value Ref Range   Glucose-Capillary 145 (H) 70 - 99 mg/dL    Comment: Glucose reference range applies only to samples taken after fasting for at least 8 hours.  Glucose, capillary     Status: Abnormal   Collection Time: 12/04/23  3:40 PM  Result Value Ref Range   Glucose-Capillary 137 (H) 70 - 99 mg/dL    Comment: Glucose reference range applies only to samples taken after fasting for at least 8 hours.  Glucose, capillary     Status: Abnormal   Collection Time: 12/04/23  8:47 PM  Result Value Ref Range   Glucose-Capillary 161 (H) 70 - 99 mg/dL    Comment: Glucose reference range applies only  to samples taken after fasting for at least 8 hours.  CBC with Differential/Platelet     Status: Abnormal   Collection Time: 12/05/23  6:49 AM  Result Value Ref Range   WBC 10.6 (H) 4.0 - 10.5 K/uL   RBC 3.52 (L) 4.22 - 5.81 MIL/uL   Hemoglobin 11.1 (L) 13.0 - 17.0 g/dL   HCT 16.1 (L) 09.6 - 04.5 %   MCV 93.5 80.0 - 100.0 fL   MCH 31.5 26.0 - 34.0 pg   MCHC 33.7 30.0 - 36.0 g/dL   RDW 40.9 81.1 - 91.4 %   Platelets 241 150 - 400 K/uL   nRBC 0.0 0.0 - 0.2 %   Neutrophils Relative % 72 %   Neutro Abs 7.7 1.7 - 7.7 K/uL   Lymphocytes Relative 12 %   Lymphs Abs 1.2 0.7 - 4.0 K/uL   Monocytes Relative 10 %   Monocytes Absolute 1.0 0.1 - 1.0 K/uL    Eosinophils Relative 5 %   Eosinophils Absolute 0.6 (H) 0.0 - 0.5 K/uL   Basophils Relative 1 %   Basophils Absolute 0.1 0.0 - 0.1 K/uL   Immature Granulocytes 0 %   Abs Immature Granulocytes 0.04 0.00 - 0.07 K/uL    Comment: Performed at [facility], [address] [place], [place] [address]  Comprehensive metabolic panel     Status: Abnormal   Collection Time: 12/05/23  6:49 AM  Result Value Ref Range   Sodium 135 135 - 145 mmol/L   Potassium 4.4 3.5 - 5.1 mmol/L   Chloride 100 98 - 111 mmol/L   CO2 31 22 - 32 mmol/L   Glucose, Bld 122 (H) 70 - 99 mg/dL    Comment: Glucose reference range applies only to samples taken after fasting for at least 8 hours.   BUN 26 (H) 8 - 23 mg/dL   Creatinine, Ser 6.21 0.61 - 1.24 mg/dL   Calcium 8.6 (L) 8.9 - 10.3 mg/dL   Total Protein 5.2 (L) 6.5 - 8.1 g/dL   Albumin 2.3 (L) 3.5 - 5.0 g/dL   AST 24 15 - 41 U/L   ALT 15 0 - 44 U/L   Alkaline Phosphatase 74 38 - 126 U/L   Total Bilirubin 0.7 <1.2 mg/dL   GFR, Estimated >30 >86 mL/min    Comment: (NOTE) Calculated using the CKD-EPI Creatinine Equation (2021)    Anion gap 4 (L) 5 - 15    Comment: Performed at [facility], [address], [place], [place] [address]  Phosphorus     Status: None   Collection Time: 12/05/23  6:49 AM  Result Value Ref Range   Phosphorus 2.6 2.5 - 4.6 mg/dL    Comment: Performed at [facility], [address], [place], [place] [address]  Magnesium     Status: None   Collection Time: 12/05/23  6:49 AM  Result Value Ref Range   Magnesium 2.0 1.7 - 2.4 mg/dL    Comment: Performed at [facility], [address], [place], [place] [address]  Glucose, capillary     Status: Abnormal   Collection Time: 12/05/23  7:52 AM  Result Value Ref Range   Glucose-Capillary 121 (H) 70 - 99 mg/dL    Comment: Glucose reference range applies only to samples taken after fasting for at least 8 hours.   Hemoglobin and hematocrit, blood     Status: Abnormal   Collection Time: 12/05/23  9:24 AM  Result  Value Ref Range   Hemoglobin 10.3 (L) 13.0 - 17.0 g/dL   HCT 16.1 (L) 09.6 - 04.5 %    Comment: Performed at [facility], [address] [place], [place] [address]  Glucose, capillary     Status: Abnormal   Collection Time: 12/05/23 12:09 PM  Result Value Ref Range   Glucose-Capillary 153 (H) 70 - 99 mg/dL    Comment: Glucose reference range applies only to samples taken after fasting for at least 8 hours.  Occult blood card to lab, stool     Status: Abnormal   Collection Time: 12/05/23  3:14 PM  Result Value Ref Range   Fecal Occult Bld POSITIVE (A) NEGATIVE    Comment: Performed at [facility], [address], [place], [place] [address]  Glucose, capillary     Status: Abnormal   Collection Time: 12/05/23  4:32 PM  Result Value Ref Range   Glucose-Capillary 116 (H) 70 - 99 mg/dL    Comment: Glucose reference range applies only to samples taken after fasting for at least 8 hours.  Glucose, capillary     Status: Abnormal   Collection Time: 12/05/23  9:27 PM  Result Value Ref Range   Glucose-Capillary 179 (H) 70 - 99 mg/dL    Comment: Glucose reference range applies only to samples taken after fasting for at least 8 hours.   Comment 1 Notify RN   CBC with Differential/Platelet     Status: Abnormal   Collection Time: 12/06/23  5:59 AM  Result Value Ref Range   WBC 9.0 4.0 - 10.5 K/uL   RBC 3.30 (L) 4.22 - 5.81 MIL/uL   Hemoglobin 10.2 (L) 13.0 - 17.0 g/dL   HCT 82.9 (L) 56.2 - 13.0 %   MCV 91.5 80.0 - 100.0 fL   MCH 30.9 26.0 - 34.0 pg   MCHC 33.8 30.0 - 36.0 g/dL   RDW 86.5 78.4 - 69.6 %   Platelets 224 150 - 400 K/uL   nRBC 0.0 0.0 - 0.2 %   Neutrophils Relative % 72 %   Neutro Abs 6.6 1.7 - 7.7 K/uL   Lymphocytes Relative 12 %   Lymphs Abs 1.1 0.7 - 4.0 K/uL   Monocytes Relative 10 %   Monocytes Absolute 0.9 0.1 - 1.0 K/uL    Eosinophils Relative 4 %   Eosinophils Absolute 0.3 0.0 - 0.5 K/uL   Basophils Relative 1 %   Basophils Absolute 0.1 0.0 - 0.1 K/uL   Immature Granulocytes 1 %   Abs Immature Granulocytes 0.05 0.00 - 0.07 K/uL    Comment: Performed at [facility], [address] [place], [place] [address]  Comprehensive metabolic panel     Status: Abnormal   Collection Time: 12/06/23  5:59 AM  Result Value Ref Range   Sodium 134 (L) 135 - 145 mmol/L   Potassium 3.8 3.5 - 5.1 mmol/L   Chloride 97 (L) 98 - 111 mmol/L   CO2 30 22 - 32 mmol/L   Glucose, Bld 146 (H) 70 - 99 mg/dL    Comment: Glucose reference range applies only to samples taken after fasting for at least 8 hours.   BUN 25 (H) 8 - 23 mg/dL   Creatinine, Ser 4.13 0.61 - 1.24 mg/dL   Calcium 8.8 (L) 8.9 - 10.3 mg/dL   Total Protein 5.1 (L) 6.5 - 8.1 g/dL   Albumin 2.2 (L) 3.5 - 5.0 g/dL   AST 22 15 - 41  U/L   ALT 14 0 - 44 U/L   Alkaline Phosphatase 75 38 - 126 U/L   Total Bilirubin 0.9 <1.2 mg/dL   GFR, Estimated >33 >29 mL/min    Comment: (NOTE) Calculated using the CKD-EPI Creatinine Equation (2021)    Anion gap 7 5 - 15    Comment: Performed at [facility], [address], [place], [place] [address]  Magnesium     Status: None   Collection Time: 12/06/23  5:59 AM  Result Value Ref Range   Magnesium 1.9 1.7 - 2.4 mg/dL    Comment: Performed at [facility], [address], [place], [place] [address]  Phosphorus     Status: None   Collection Time: 12/06/23  5:59 AM  Result Value Ref Range   Phosphorus 3.0 2.5 - 4.6 mg/dL    Comment: Performed at [facility], [address], [place], [place] [address]  Glucose, capillary     Status: Abnormal   Collection Time: 12/06/23  8:41 AM  Result Value Ref Range   Glucose-Capillary 139 (H) 70 - 99 mg/dL    Comment: Glucose reference range applies only to samples taken after fasting for at least 8 hours.   Glucose, capillary     Status: Abnormal   Collection Time: 12/06/23 11:58 AM  Result Value Ref Range   Glucose-Capillary 167 (H) 70 - 99 mg/dL    Comment: Glucose reference range applies only to samples taken after fasting for at least 8 hours.  Urinalysis, Complete w Microscopic -Urine, Random     Status: Abnormal   Collection Time: 12/06/23 12:27 PM  Result Value Ref Range   Color, Urine STRAW (A) YELLOW   APPearance CLEAR CLEAR   Specific Gravity, Urine 1.006 1.005 - 1.030   pH 7.0 5.0 - 8.0   Glucose, UA NEGATIVE NEGATIVE mg/dL   Hgb urine dipstick MODERATE (A) NEGATIVE   Bilirubin Urine NEGATIVE NEGATIVE   Ketones, ur NEGATIVE NEGATIVE mg/dL   Protein, ur 109 (A) NEGATIVE mg/dL   Nitrite NEGATIVE NEGATIVE   Leukocytes,Ua NEGATIVE NEGATIVE   RBC / HPF 21-50 0 - 5 RBC/hpf   WBC, UA 0-5 0 - 5 WBC/hpf   Bacteria, UA NONE SEEN NONE SEEN   Squamous Epithelial / HPF 0-5 0 - 5 /HPF   Hyaline Casts, UA PRESENT     Comment: Performed at [facility], [address], [place], [place] [address]  Glucose, capillary     Status: Abnormal   Collection Time: 12/06/23  4:52 PM  Result Value Ref Range   Glucose-Capillary 150 (H) 70 - 99 mg/dL    Comment: Glucose reference range applies only to samples taken after fasting for at least 8 hours.  Glucose, capillary     Status: Abnormal   Collection Time: 12/06/23  8:54 PM  Result Value Ref Range   Glucose-Capillary 164 (H) 70 - 99 mg/dL    Comment: Glucose reference range applies only to samples taken after fasting for at least 8 hours.  CBC with Differential/Platelet     Status: Abnormal   Collection Time: 12/07/23  5:19 AM  Result Value Ref Range   WBC 8.5 4.0 - 10.5 K/uL   RBC 3.36 (L) 4.22 - 5.81 MIL/uL   Hemoglobin 10.3 (L) 13.0 - 17.0 g/dL   HCT 73.2 (L) 20.2 - 54.2 %   MCV 94.0 80.0 - 100.0 fL   MCH 30.7 26.0 - 34.0 pg   MCHC  32.6 30.0 - 36.0 g/dL   RDW 09.8 11.9 - 14.7 %   Platelets 218 150 - 400 K/uL   nRBC  0.0 0.0 - 0.2 %   Neutrophils Relative % 67 %   Neutro Abs 5.8 1.7 - 7.7 K/uL   Lymphocytes Relative 14 %   Lymphs Abs 1.1 0.7 - 4.0 K/uL   Monocytes Relative 12 %   Monocytes Absolute 1.0 0.1 - 1.0 K/uL   Eosinophils Relative 5 %   Eosinophils Absolute 0.4 0.0 - 0.5 K/uL   Basophils Relative 1 %   Basophils Absolute 0.1 0.0 - 0.1 K/uL   Immature Granulocytes 1 %   Abs Immature Granulocytes 0.04 0.00 - 0.07 K/uL    Comment: Performed at [facility], [address] [place], [place] [address]  Comprehensive metabolic panel     Status: Abnormal   Collection Time: 12/07/23  5:19 AM  Result Value Ref Range   Sodium 136 135 - 145 mmol/L   Potassium 4.1 3.5 - 5.1 mmol/L   Chloride 100 98 - 111 mmol/L   CO2 30 22 - 32 mmol/L   Glucose, Bld 123 (H) 70 - 99 mg/dL    Comment: Glucose reference range applies only to samples taken after fasting for at least 8 hours.   BUN 25 (H) 8 - 23 mg/dL   Creatinine, Ser 2.13 0.61 - 1.24 mg/dL   Calcium 8.9 8.9 - 08.6 mg/dL   Total Protein 5.1 (L) 6.5 - 8.1 g/dL   Albumin 2.3 (L) 3.5 - 5.0 g/dL   AST 23 15 - 41 U/L   ALT 13 0 - 44 U/L   Alkaline Phosphatase 73 38 - 126 U/L   Total Bilirubin 0.8 <1.2 mg/dL   GFR, Estimated >57 >84 mL/min    Comment: (NOTE) Calculated using the CKD-EPI Creatinine Equation (2021)    Anion gap 6 5 - 15    Comment: Performed at [facility], [address], [place], [place] [address]  Magnesium     Status: None   Collection Time: 12/07/23  5:19 AM  Result Value Ref Range   Magnesium 2.0 1.7 - 2.4 mg/dL    Comment: Performed at [facility], [address], [place], [place] [address]  Phosphorus     Status: None   Collection Time: 12/07/23  5:19 AM  Result Value Ref Range   Phosphorus 2.7 2.5 - 4.6 mg/dL    Comment: Performed at [facility], [address], [place], [place] [address]  Glucose, capillary     Status: Abnormal   Collection  Time: 12/07/23  8:04 AM  Result Value Ref Range   Glucose-Capillary 125 (H) 70 - 99 mg/dL    Comment: Glucose reference range applies only to samples taken after fasting for at least 8 hours.  Glucose, capillary     Status: Abnormal   Collection Time: 12/07/23 12:52 PM  Result Value Ref Range   Glucose-Capillary 215 (H) 70 - 99 mg/dL    Comment: Glucose reference range applies only to samples taken after fasting for at least 8 hours.  Cancer antigen 19-9     Status: Abnormal   Collection Time: 12/07/23  3:37 PM  Result Value Ref Range   CA 19-9 10,698 (H) 0 - 35 U/mL    Comment: (NOTE) Results confirmed on dilution. Roche Diagnostics Electrochemiluminescence Immunoassay (ECLIA) Values obtained with different assay methods or kits cannot be used interchangeably.  Results cannot be  interpreted as absolute evidence of the presence or absence of malignant disease. Performed At: [facility] [address] [place], [place] [id number] Jolene Schimke MD WU:[id number]   Glucose, capillary     Status: Abnormal   Collection Time: 12/07/23  5:32 PM  Result Value Ref Range   Glucose-Capillary 132 (H) 70 - 99 mg/dL    Comment: Glucose reference range applies only to samples taken after fasting for at least 8 hours.  Glucose, capillary     Status: Abnormal   Collection Time: 12/07/23  9:23 PM  Result Value Ref Range   Glucose-Capillary 141 (H) 70 - 99 mg/dL    Comment: Glucose reference range applies only to samples taken after fasting for at least 8 hours.  Protime-INR     Status: Abnormal   Collection Time: 12/08/23  5:47 AM  Result Value Ref Range   Prothrombin Time 16.2 (H) 11.4 - 15.2 seconds   INR 1.3 (H) 0.8 - 1.2    Comment: (NOTE) INR goal varies based on device and disease states. Performed at [facility], [address], [place], [place] [address]   Glucose, capillary     Status: Abnormal   Collection Time: 12/08/23  7:57 AM  Result Value Ref  Range   Glucose-Capillary 125 (H) 70 - 99 mg/dL    Comment: Glucose reference range applies only to samples taken after fasting for at least 8 hours.  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  The morphologic features are consistent with a pancreatic primary. Additional testing was not performed to preserve tissue for clinician directed testing.  Dr. Kenard Gower has peer reviewed the case and agrees with the diagnosis.  Dr. Arlana Pouch was notified of the diagnosis on 12/09/2023.    GROSS DESCRIPTION:  Received in formalin are 7 cores of gray-white to pale yellow soft to firm tissue which range from 0.2 x 0.1 cm to 1.3 x 0.1 cm and are divided among 2 blocks for routine histology.  SW 12/08/2023     Final Diagnosis performed by Orene Desanctis DO.   Electronically signed 12/09/2023 Technical and / or Professional components performed at [facility], [address], [place], [place] [address].  Immunohistochemistry Technical component (if applicable) was performed at [facility]. [address], [place], [place] [address].   IMMUNOHISTOCHEMISTRY DISCLAIMER (if applicable): Some of these immunohistochemical stains may have been developed and the performance characteristics determine by [facility]. Some may not have been cleared or approved by the U.S. Food and Drug Administration. The  FDA has determined that such clearance or approval is not necessary. This test is used for clinical purposes. It should not be regarded as investigational or for research. This laboratory is certified under the Clinical Laboratory Improvement Amendments of 1988 (CLIA-88) as qualified to perform high complexity clinical labora tory testing.  The controls stained appropriately.   IHC stains are performed on formalin fixed, paraffin embedded tissue  using a 3,3"diaminobenzidine (DAB) chromogen and Leica Bond Autostainer System. The staining intensity of the nucleus is score manually and is reported as the percentage of tumor cell nuclei demonstrating specific nuclear staining. The specimens are fixed in 10% Neutral Formalin for at least 6 hours and up to 72hrs. These tests are validated on decalcified tissue. Results should be interpreted with caution given the possibility of false negative results on decalcified specimens. Antibody Clones are as follows ER-clone 33F, PR-clone 16, Ki67- clone MM1. Some of these immunohistochemical stains may have been developed and the performance characteristics determined by [facility].   Glucose, capillary     Status: Abnormal   Collection Time: 12/08/23 11:51 AM  Result Value Ref Range   Glucose-Capillary 120 (H) 70 - 99 mg/dL    Comment: Glucose reference range applies only to samples taken after fasting for at least 8 hours.  Glucose, capillary     Status: Abnormal   Collection Time: 12/08/23  5:05 PM  Result Value Ref Range   Glucose-Capillary 198 (H) 70 - 99 mg/dL    Comment: Glucose reference range applies only to samples taken after fasting for at least 8 hours.  Glucose, capillary     Status: Abnormal   Collection Time: 12/08/23  9:35 PM  Result Value Ref Range   Glucose-Capillary 121 (H) 70 - 99 mg/dL    Comment: Glucose reference range applies only to samples taken after fasting for at least 8 hours.   Comment 1 Notify RN   Basic  metabolic panel     Status: Abnormal   Collection Time: 12/09/23  5:41 AM  Result Value Ref Range   Sodium 134 (L) 135 - 145 mmol/L   Potassium 4.7 3.5 - 5.1 mmol/L   Chloride 99 98 - 111 mmol/L   CO2 28 22 - 32 mmol/L   Glucose, Bld 147 (H) 70 - 99 mg/dL    Comment: Glucose reference range applies only to samples taken after fasting for at least 8 hours.   BUN 37 (H) 8 - 23 mg/dL   Creatinine, Ser 1.61 0.61 - 1.24 mg/dL   Calcium 8.8 (L) 8.9 - 10.3 mg/dL   GFR, Estimated >09 >60 mL/min    Comment: (NOTE) Calculated using the CKD-EPI Creatinine Equation (2021)    Anion gap 7 5 - 15    Comment: Performed at [facility], [address] [place], [place] [address]  CBC     Status: Abnormal   Collection Time: 12/09/23  5:41 AM  Result Value Ref Range   WBC 9.9 4.0 - 10.5 K/uL   RBC 2.96 (L) 4.22 - 5.81 MIL/uL   Hemoglobin 9.2 (L) 13.0 - 17.0 g/dL   HCT 81.1 (L) 91.4 - 78.2 %   MCV 94.3 80.0 - 100.0 fL   MCH 31.1 26.0 - 34.0 pg   MCHC 33.0 30.0 - 36.0 g/dL   RDW 95.6 21.3 - 08.6 %   Platelets 178 150 - 400 K/uL   nRBC 0.0 0.0 - 0.2 %    Comment: Performed at [facility], [address], [place], [place] [address]  Magnesium     Status: None   Collection Time: 12/09/23  5:41 AM  Result Value Ref Range   Magnesium 2.0 1.7 - 2.4 mg/dL    Comment: Performed at [facility], [address], [place], [place] [address]  Phosphorus     Status: None   Collection Time: 12/09/23  5:41 AM  Result Value Ref Range   Phosphorus 2.9 2.5 - 4.6 mg/dL    Comment: Performed at [facility], [address] [place], [place] [address]  Glucose, capillary     Status: Abnormal   Collection Time: 12/09/23  7:59 AM  Result Value Ref Range   Glucose-Capillary 158 (H) 70 - 99 mg/dL    Comment: Glucose reference range applies only to samples taken after fasting for at least 8 hours.  Glucose, capillary     Status: Abnormal    Collection Time: 12/09/23 11:19 AM  Result Value Ref Range   Glucose-Capillary 183 (H) 70 - 99 mg/dL    Comment: Glucose reference range applies only to samples taken after fasting for at least 8 hours.  Glucose, capillary     Status: Abnormal   Collection Time: 12/09/23  5:17 PM  Result Value Ref Range   Glucose-Capillary 168 (H) 70 - 99 mg/dL    Comment: Glucose reference range applies only to samples taken after fasting for at least 8 hours.  Glucose, capillary     Status: Abnormal   Collection Time: 12/09/23  9:22 PM  Result Value Ref Range   Glucose-Capillary 108 (H) 70 - 99 mg/dL    Comment: Glucose reference range applies only to samples taken after fasting for at least 8 hours.  Basic metabolic panel     Status: Abnormal   Collection Time: 12/10/23  6:02 AM  Result Value Ref Range   Sodium 134 (L) 135 - 145 mmol/L   Potassium 4.9 3.5 - 5.1 mmol/L   Chloride 98 98 - 111 mmol/L   CO2 28 22 - 32 mmol/L   Glucose, Bld 133 (H) 70 - 99 mg/dL    Comment: Glucose reference range applies only to samples taken after fasting for at least 8 hours.   BUN 46 (H) 8 - 23 mg/dL   Creatinine, Ser 6.04 (H) 0.61 - 1.24 mg/dL   Calcium 8.7 (L) 8.9 - 10.3 mg/dL   GFR, Estimated 53 (L) >60 mL/min    Comment: (NOTE) Calculated using the CKD-EPI Creatinine Equation (2021)    Anion gap 8 5 - 15    Comment: Performed at  Hospital, [address] [place], [place] [address]  CBC     Status: Abnormal   Collection Time: 12/10/23  6:02 AM  Result Value Ref Range   WBC 8.7 4.0 - 10.5 K/uL   RBC 2.76 (L) 4.22 - 5.81 MIL/uL   Hemoglobin 8.7 (L) 13.0 - 17.0 g/dL   HCT 11.9 (L) 14.7 - 82.9 %   MCV 94.9 80.0 - 100.0 fL   MCH 31.5 26.0 - 34.0 pg   MCHC 33.2 30.0 - 36.0 g/dL   RDW 56.2 13.0 - 86.5 %   Platelets 187 150 - 400 K/uL   nRBC 0.0 0.0 - 0.2 %    Comment: Performed at [facility], [address], [place], [place] [address]  Magnesium     Status: None    Collection Time: 12/10/23  6:02 AM  Result Value Ref Range   Magnesium 2.2 1.7 - 2.4 mg/dL    Comment: Performed at [facility], [address] [place], [place] [address]  Glucose, capillary     Status: Abnormal   Collection Time: 12/10/23  7:56 AM  Result Value Ref Range   Glucose-Capillary 121 (H) 70 - 99 mg/dL    Comment: Glucose reference range applies only to samples taken after  fasting for at least 8 hours.  Glucose, capillary     Status: Abnormal   Collection Time: 12/10/23 11:26 AM  Result Value Ref Range   Glucose-Capillary 167 (H) 70 - 99 mg/dL    Comment: Glucose reference range applies only to samples taken after fasting for at least 8 hours.  Glucose, capillary     Status: Abnormal   Collection Time: 12/10/23  5:27 PM  Result Value Ref Range   Glucose-Capillary 144 (H) 70 - 99 mg/dL    Comment: Glucose reference range applies only to samples taken after fasting for at least 8 hours.  Glucose, capillary     Status: Abnormal   Collection Time: 12/10/23  8:32 PM  Result Value Ref Range   Glucose-Capillary 157 (H) 70 - 99 mg/dL    Comment: Glucose reference range applies only to samples taken after fasting for at least 8 hours.  Magnesium     Status: Abnormal   Collection Time: 12/11/23  5:22 AM  Result Value Ref Range   Magnesium 2.5 (H) 1.7 - 2.4 mg/dL    Comment: Performed at [facility], [address], [place], [place] [address]  CBC with Differential/Platelet     Status: Abnormal   Collection Time: 12/11/23  5:22 AM  Result Value Ref Range   WBC 9.5 4.0 - 10.5 K/uL   RBC 2.92 (L) 4.22 - 5.81 MIL/uL   Hemoglobin 9.1 (L) 13.0 - 17.0 g/dL   HCT 60.4 (L) 54.0 - 98.1 %   MCV 98.3 80.0 - 100.0 fL   MCH 31.2 26.0 - 34.0 pg   MCHC 31.7 30.0 - 36.0 g/dL   RDW 19.1 47.8 - 29.5 %   Platelets 223 150 - 400 K/uL   nRBC 0.0 0.0 - 0.2 %   Neutrophils Relative % 68 %   Neutro Abs 6.6 1.7 - 7.7 K/uL   Lymphocytes Relative 13 %    Lymphs Abs 1.3 0.7 - 4.0 K/uL   Monocytes Relative 11 %   Monocytes Absolute 1.0 0.1 - 1.0 K/uL   Eosinophils Relative 6 %   Eosinophils Absolute 0.5 0.0 - 0.5 K/uL   Basophils Relative 1 %   Basophils Absolute 0.1 0.0 - 0.1 K/uL   Immature Granulocytes 1 %   Abs Immature Granulocytes 0.07 0.00 - 0.07 K/uL    Comment: Performed at [facility], [address], [place], [place] [address]  Comprehensive metabolic panel     Status: Abnormal   Collection Time: 12/11/23  5:22 AM  Result Value Ref Range   Sodium 132 (L) 135 - 145 mmol/L   Potassium 4.6 3.5 - 5.1 mmol/L   Chloride 99 98 - 111 mmol/L   CO2 25 22 - 32 mmol/L   Glucose, Bld 125 (H) 70 - 99 mg/dL    Comment: Glucose reference range applies only to samples taken after fasting for at least 8 hours.   BUN 46 (H) 8 - 23 mg/dL   Creatinine, Ser 8.65 (H) 0.61 - 1.24 mg/dL   Calcium 8.5 (L) 8.9 - 10.3 mg/dL   Total Protein 5.1 (L) 6.5 - 8.1 g/dL   Albumin 2.2 (L) 3.5 - 5.0 g/dL   AST 25 15 - 41 U/L   ALT 13 0 - 44 U/L   Alkaline Phosphatase 70 38 - 126 U/L   Total Bilirubin 0.7 <1.2 mg/dL   GFR, Estimated 53 (L) >60 mL/min    Comment: (NOTE) Calculated using the CKD-EPI Creatinine  Equation (2021)    Anion gap 8 5 - 15    Comment: Performed at [facility], [address] [place], [place] [address]  Phosphorus     Status: None   Collection Time: 12/11/23  5:22 AM  Result Value Ref Range   Phosphorus 3.4 2.5 - 4.6 mg/dL    Comment: Performed at [facility], [address], [place], [place] [address]  Glucose, capillary     Status: Abnormal   Collection Time: 12/11/23  7:31 AM  Result Value Ref Range   Glucose-Capillary 124 (H) 70 - 99 mg/dL    Comment: Glucose reference range applies only to samples taken after fasting for at least 8 hours.  Glucose, capillary     Status: Abnormal   Collection Time: 12/11/23 12:05 PM  Result Value Ref Range   Glucose-Capillary 126  (H) 70 - 99 mg/dL    Comment: Glucose reference range applies only to samples taken after fasting for at least 8 hours.  Glucose, capillary     Status: Abnormal   Collection Time: 12/11/23  3:49 PM  Result Value Ref Range   Glucose-Capillary 134 (H) 70 - 99 mg/dL    Comment: Glucose reference range applies only to samples taken after fasting for at least 8 hours.  CUP PACEART REMOTE DEVICE CHECK     Status: None   Collection Time: 12/13/23 11:01 PM  Result Value Ref Range   Date Time Interrogation Session [id number]    Pulse Generator Manufacturer MERM    Pulse Gen Model LNQ22 LINQ II    Pulse Gen Serial Number [id number]    Clinic Name [facility]    Implantable Pulse Generator Type ICM/ILR    Implantable Pulse Generator Implant Date [id number]   CEA ([facility] ONLY     Status: Abnormal   Collection Time: 12/21/23  3:58 PM  Result Value Ref Range   CEA ([facility]) 10.94 (H) 0.00 - 5.00 ng/mL    Comment: (NOTE) This test was performed using Beckman Coulter's paramagnetic chemiluminescent immunoassay. Values obtained from different assay methods cannot be used interchangeably. Please note that up to 8% of patients who smoke may see values 5.1-10.0 ng/ml and 1% of patients who smoke may see CEA levels >10.0 ng/ml. Performed at [facility], [address], [place], [place] [address]   CA 19.9     Status: Abnormal   Collection Time: 12/21/23  3:58 PM  Result Value Ref Range   CA 19-9 20,297 (H) 0 - 35 U/mL    Comment: (NOTE) Results confirmed on dilution. Roche Diagnostics Electrochemiluminescence Immunoassay (ECLIA) Values obtained with different assay methods or kits cannot be used interchangeably.  Results cannot be interpreted as absolute evidence of the presence or absence of malignant disease. Performed At: [facility] [address] [place], [place] [id number] Jolene Schimke MD WU:[id number]   Lactate dehydrogenase     Status: Abnormal    Collection Time: 12/21/23  3:58 PM  Result Value Ref Range   LDH 202 (H) 98 - 192 U/L    Comment: Performed at [facility], [address] [place], [place] [address]  Comprehensive metabolic panel     Status: Abnormal   Collection Time: 12/21/23  3:58 PM  Result Value Ref Range   Sodium 139 135 - 145 mmol/L   Potassium 5.3 (H) 3.5 - 5.1 mmol/L   Chloride 101 98 - 111 mmol/L   CO2 30 22 - 32 mmol/L   Glucose, Bld  154 (H) 70 - 99 mg/dL    Comment: Glucose reference range applies only to samples taken after fasting for at least 8 hours.   BUN 37 (H) 8 - 23 mg/dL   Creatinine, Ser 4.09 0.61 - 1.24 mg/dL   Calcium 9.4 8.9 - 81.1 mg/dL   Total Protein 6.4 (L) 6.5 - 8.1 g/dL   Albumin 3.3 (L) 3.5 - 5.0 g/dL   AST 24 15 - 41 U/L   ALT 15 0 - 44 U/L   Alkaline Phosphatase 92 38 - 126 U/L   Total Bilirubin 0.4 0.0 - 1.2 mg/dL   GFR, Estimated 59 (L) >60 mL/min    Comment: (NOTE) Calculated using the CKD-EPI Creatinine Equation (2021)    Anion gap 8 5 - 15    Comment: Performed at [facility], [address], [place], [place] [address]  CBC with Differential/Platelet     Status: Abnormal   Collection Time: 12/21/23  3:58 PM  Result Value Ref Range   WBC 10.5 4.0 - 10.5 K/uL   RBC 3.57 (L) 4.22 - 5.81 MIL/uL   Hemoglobin 11.1 (L) 13.0 - 17.0 g/dL   HCT 29.5 (L) 62.1 - 30.8 %   MCV 95.2 80.0 - 100.0 fL   MCH 31.1 26.0 - 34.0 pg   MCHC 32.6 30.0 - 36.0 g/dL   RDW 65.7 84.6 - 96.2 %   Platelets 335 150 - 400 K/uL   nRBC 0.0 0.0 - 0.2 %   Neutrophils Relative % 73 %   Neutro Abs 7.8 (H) 1.7 - 7.7 K/uL   Lymphocytes Relative 12 %   Lymphs Abs 1.3 0.7 - 4.0 K/uL   Monocytes Relative 9 %   Monocytes Absolute 0.9 0.1 - 1.0 K/uL   Eosinophils Relative 4 %   Eosinophils Absolute 0.4 0.0 - 0.5 K/uL   Basophils Relative 1 %   Basophils Absolute 0.1 0.0 - 0.1 K/uL   Immature Granulocytes 1 %   Abs Immature Granulocytes 0.08 (H) 0.00 - 0.07 K/uL     Comment: Performed at [facility], [address], [place], [place] [address]  Glucose, capillary     Status: Abnormal   Collection Time: 12/24/23  3:48 PM  Result Value Ref Range   Glucose-Capillary 151 (H) 70 - 99 mg/dL    Comment: Glucose reference range applies only to samples taken after fasting for at least 8 hours.  CUP PACEART REMOTE DEVICE CHECK     Status: None   Collection Time: 01/17/24 11:02 PM  Result Value Ref Range   Date Time Interrogation Session [phone]    Pulse Generator Manufacturer MERM    Pulse Gen Model LNQ22 Donato Heinz    Pulse Gen Serial Number [id number]    Clinic Name [facility]    Implantable Pulse Generator Type ICM/ILR    Implantable Pulse Generator Implant Date [id number]        Latest Ref Rng & Units 12/21/2023    3:58 PM 12/11/2023    5:22 AM 12/10/2023    6:02 AM  CMP  Glucose 70 - 99 mg/dL 643  329  518   BUN 8 - 23 mg/dL 37  46  46   Creatinine 0.61 - 1.24 mg/dL 8.41  6.60  6.30   Sodium 135 - 145 mmol/L 139  132  134   Potassium 3.5 - 5.1 mmol/L 5.3  4.6  4.9   Chloride 98 - 111 mmol/L 101  99  98  CO2 22 - 32 mmol/L 30  25  28    Calcium 8.9 - 10.3 mg/dL 9.4  8.5  8.7   Total Protein 6.5 - 8.1 g/dL 6.4  5.1    Total Bilirubin 0.0 - 1.2 mg/dL 0.4  0.7    Alkaline Phos 38 - 126 U/L 92  70    AST 15 - 41 U/L 24  25    ALT 0 - 44 U/L 15  13      Lab Results  Component Value Date   WBC 10.5 12/21/2023   HGB 11.1 (L) 12/21/2023   HCT 34.0 (L) 12/21/2023   MCV 95.2 12/21/2023   PLT 335 12/21/2023   NEUTROABS 7.8 (H) 12/21/2023     RADIOGRAPHIC STUDIES:  I have personally reviewed the radiological images as listed and agree with the findings in the report.  CUP PACEART REMOTE DEVICE CHECK Result Date: 01/18/2024 ILR summary report received. Battery status OK. Normal device function. No new symptom, tachy, brady, or pause episodes. No new AF episodes. Monthly summary reports and ROV/PRN. MC, CVRS  NM  PET Image Initial (PI) Skull Base To Thigh Result Date: 01/01/2024 CLINICAL DATA:  Initial treatment strategy for pancreatic adenocarcinoma. EXAM: NUCLEAR MEDICINE PET SKULL BASE TO THIGH TECHNIQUE: 7.9 mCi F-18 FDG was injected intravenously. Full-ring PET imaging was performed from the skull base to thigh after the radiotracer. CT data was obtained and used for attenuation correction and anatomic localization. Fasting blood glucose: 151 mg/dl COMPARISON:  Chest CT 60/45/4098.  Abdomen and pelvis CT 12/06/2023 FINDINGS: Mediastinal blood pool activity: SUV max 2.9 Liver activity: SUV max NA NECK: 12 mm short axis right supraclavicular node on 51/4 is hypermetabolic with SUV max = 4.3. Incidental CT findings: None. CHEST: Hypermetabolic mediastinal lymphadenopathy. Index low right paratracheal node on 73/4 measures 2 cm short axis with SUV max = 7.9. No hypermetabolic axillary lymphadenopathy. Scattered tiny pulmonary nodules, better demonstrated on recent chest CT again noted. Most of these show no hypermetabolism although they are below accepted size threshold for reliable resolution on PET imaging. Some of the more dominant nodules are in the posterior right costophrenic sulcus in show no demonstrable hypermetabolism on PET imaging. Incidental CT findings: Coronary artery calcification is evident. Moderate atherosclerotic calcification is noted in the wall of the thoracic aorta. ABDOMEN/PELVIS: The patient's known pancreatic mass is hypermetabolic with SUV max = 6.5. Hypermetabolic liver metastases noted including dominant segment II/III lesion described previously with SUV max = 5.9 today. Hypermetabolic retrocrural lymph nodes are evident. 9 mm short axis portal caval lymph node is hypermetabolic with SUV max = 5.3. Hypermetabolic lymph nodes are seen in the periaortic retroperitoneal space. 2.7 cm right omental lesion on 143/4 is hypermetabolic with SUV max = 5.6. Additional foci of hypermetabolic uptake are  seen in the omentum, mesentery, and along the peritoneal surface. Incidental CT findings: Small to moderate volume ascites. There is moderate atherosclerotic calcification of the abdominal aorta without aneurysm. SKELETON: Numerous bony hypermetabolic metastases. Destructive lesion in the lower cervical spine appears to involve the C6 level although without sagittal imaging, confident assessment of the level is not possible. There is destruction of the pedicle, lateral vertebral body, and facets. Tumor involvement of the neural foramen is suspected. SUV max = 6.3. Scattered hypermetabolic lesions are seen in the scapula, ribs, thoracic spine, lumbar spine, bony pelvis and proximal femora. Index lesion right acetabular roof demonstrates SUV max = 8.3. Incidental CT findings: Degenerative changes are noted in the lumbar spine.  IMPRESSION: 1. The patient's known pancreatic mass is hypermetabolic. 2. Hypermetabolic liver metastases. 3. Hypermetabolic right supraclavicular, mediastinal, retrocrural, and periaortic retroperitoneal lymphadenopathy. 4. Hypermetabolic omental, mesenteric, and peritoneal metastases. 5. Small to moderate volume ascites. 6. Scattered tiny pulmonary nodules, better demonstrated on recent chest CT. Most of these show no hypermetabolism although they are below accepted size threshold for reliable resolution on PET imaging. Some of the more dominant nodules are in the posterior right costophrenic sulcus in show no demonstrable hypermetabolism on PET imaging. 7. Numerous bony hypermetabolic metastases. Destructive lesion in the lower cervical spine appears to involve the C6 level although without sagittal imaging, confident assessment of the level is not possible. There is destruction of the pedicle, lateral vertebral body, and facets at this level. Tumor involvement of the neural foramen is suspected. Cervical spine MRI with and without contrast could be used to further evaluate as clinically  warranted. 8.  Aortic Atherosclerosis (ICD10-I70.0). Electronically Signed   By: Kennith Center M.D.   On: 01/01/2024 05:31     No orders of the defined types were placed in this encounter.    Future Appointments  Date Time Provider Department Center  02/08/2024 11:00 AM Nathan Stade, MD [facility]  02/19/2024 10:30 AM Nathan Nam, MD [facility]  02/22/2024  7:00 AM [facility] DEVICE REMOTES [facility]  02/26/2024  1:40 PM Armbruster, Willaim Rayas, MD [facility]  03/28/2024  7:00 AM [facility] DEVICE REMOTES [facility]  05/02/2024  7:00 AM [facility] DEVICE REMOTES [facility]  06/06/2024  7:00 AM [facility] DEVICE REMOTES [facility]  07/11/2024  7:00 AM [facility] DEVICE REMOTES [facility]  08/15/2024  7:00 AM [facility] DEVICE REMOTES [facility]  09/19/2024  7:00 AM [facility] DEVICE REMOTES [facility]  10/24/2024  7:00 AM [facility] DEVICE REMOTES [facility]  11/24/2024  8:50 AM [facility] ANNUAL WELLNESS VISIT 1 [facility]  11/28/2024  7:00 AM [facility] DEVICE REMOTES [facility]    This document was completed utilizing speech recognition software. Grammatical errors, random word insertions, pronoun errors, and incomplete sentences are an occasional consequence of this system due to software limitations, ambient noise, and hardware issues. Any formal questions or concerns about the content, text or information contained within the body of this dictation should be directly addressed to the provider for clarification.

## 2024-01-19 NOTE — Assessment & Plan Note (Addendum)
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 Discussed the aggressive nature of the disease and the limited treatment options. Explained that the goal of systemic treatments is to control the cancer from further spreading and getting bigger, but it is not curable at this stage. Discussed the potential side effects of chemotherapy and the need for the patient to be strong enough to tolerate it.  -Previously I discussed staging, prognosis, plan of care, treatment options.  Reviewed NCCN guidelines.  Staging PET/CT on 12/24/2023 showed hypermetabolic pancreatic mass, liver metastasis, right supraclavicular, mediastinal, retrocrural and periaortic retroperitoneal lymphadenopathy.  Hypermetabolic omental, mesenteric and peritoneal metastasis were also noted.  Scattered tiny lung nodules, likely below the size threshold for PET scan.  Numerous bony hypermetabolic metastases.  Foundation One testing showed PD-L1 TPS of 35%.  Microsatellite stable.  4 Muts/Mb.  No evidence of BRCA1 or BRCA2 mutation.  No other actionable mutations.  Since his performance status was not allowing systemic chemotherapy, proposed systemic immunotherapy with single agent pembrolizumab given PD-L1 positivity.  We were waiting on insurance approval.  Patient's performance status continued to decline to ECOG 3/4.  I did detailed phone visit with patient and his wife on 01/19/2024.  Recommended to enroll in hospice.  Patient's wife is undecided at this time but she will reach out to [place] once she makes a decision.

## 2024-01-20 NOTE — Progress Notes (Signed)
Carelink Summary Report / Loop Recorder

## 2024-01-21 ENCOUNTER — Other Ambulatory Visit: Payer: Self-pay | Admitting: Family Medicine

## 2024-01-21 NOTE — Telephone Encounter (Signed)
Copied from CRM [id number]. Topic: Clinical - Medication Refill >> Jan 21, 2024  2:46 PM Deaijah H wrote: Most Recent Primary Care Visit:  Provider: Sue Lush  Department: [facility]  Visit Type: MEDICARE AWV, SEQUENTIAL  Date: 11/24/2023  Medication: furosemide (LASIX) 40 MG tablet  Has the patient contacted their pharmacy? No (Agent: If no, request that the patient contact the pharmacy for the refill. If patient does not wish to contact the pharmacy document the reason why and proceed with request.) No due to a different doctor prescribing  (Agent: If yes, when and what did the pharmacy advise?)  Is this the correct pharmacy for this prescription? Yes If no, delete pharmacy and type the correct one.  This is the patient's preferred pharmacy:  [id number] [place], [place] - [address] [place] [address] Phone: [phone] Fax: [phone]   Has the prescription been filled recently? Yes  Is the patient out of the medication? No, 1 left  Has the patient been seen for an appointment in the last year OR does the patient have an upcoming appointment? Yes  Can we respond through MyChart? Yes  Agent: Please be advised that Rx refills may take up to 3 business days. We ask that you follow-up with your pharmacy.

## 2024-01-25 ENCOUNTER — Encounter: Payer: Self-pay | Admitting: Oncology

## 2024-01-25 NOTE — Telephone Encounter (Signed)
It does not appear that you prescribed this medication

## 2024-01-26 MED ORDER — FUROSEMIDE 40 MG PO TABS: 40.0000 mg | ORAL_TABLET | Freq: Every day | ORAL | 1 refills | Status: DC

## 2024-01-26 NOTE — Telephone Encounter (Signed)
 Sent. Thanks.

## 2024-01-27 DIAGNOSIS — R404 Transient alteration of awareness: Secondary | ICD-10-CM | POA: Diagnosis not present

## 2024-01-27 DIAGNOSIS — Z743 Need for continuous supervision: Secondary | ICD-10-CM | POA: Diagnosis not present

## 2024-01-28 ENCOUNTER — Telehealth: Payer: Self-pay

## 2024-01-29 NOTE — Patient Outreach (Signed)
  Care Coordination   01/29/2024 Name: Nathan Fernandez MRN: [id number] DOB: 02-13-1941   Care Coordination Outreach Attempts:  Successful contact made with wife, Nathan Fernandez for patient. Wife states patient passed away on yesterday.  Offered my condolences and offered to call wife back in 1 month to provide grief counseling information if needed.  Patient agreeable to return call and voiced appreciation. Wife agreeable to [facility] notifying patients primary care provider of his passing.   Follow Up Plan:  Additional outreach attempts will be made to offer the patient complex care management information and services.   Encounter Outcome:  Patient Visit Completed   Care Coordination Interventions:  No, not indicated    Arvin Seip RN, BSN, CCM [place]  [facility], Population Health Case Manager Phone: [phone]

## 2024-02-01 ENCOUNTER — Telehealth: Payer: Self-pay | Admitting: Family Medicine

## 2024-02-01 NOTE — Telephone Encounter (Signed)
 Called and LMOVM for Nathan Fernandez to offer my condolences.  I didn't leave confidential info on the message.  I was glad to see this gentleman and he will be missed.    Please update chart.  Patient passed on January 28, 2024.  Thanks.

## 2024-02-01 NOTE — Telephone Encounter (Signed)
 Copied from CRM [id number]. Topic: General - Deceased Patient >> February 20, 2024  4:09 PM Jayson Michael wrote: Name of caller: Herminia Lope- funeral cremation  Date of death: 02/18/2024   Name of funeral home: [facility]- in [place]  Phone number of funeral home: [phone]  Provider that needs to sign form: Dr. Richrd Char   Timeline for signing: sometime beginning next of next week  Case ID #[id number]

## 2024-02-08 ENCOUNTER — Ambulatory Visit: Payer: Medicare Other | Admitting: Cardiovascular Disease

## 2024-02-19 ENCOUNTER — Ambulatory Visit: Payer: Medicare Other | Admitting: Family Medicine

## 2024-02-20 DIAGNOSIS — 419620001 Death: Secondary | SNOMED CT | POA: Diagnosis not present

## 2024-02-20 DEATH — deceased

## 2024-02-25 NOTE — Progress Notes (Signed)
 [facility] Summary Report / Loop Recorder

## 2024-02-26 ENCOUNTER — Ambulatory Visit: Payer: Medicare Other | Admitting: Gastroenterology
